# Patient Record
Sex: Female | Born: 1935 | Race: Black or African American | Hispanic: No | Smoking: Former smoker
Health system: Southern US, Community
[De-identification: ages and names within clinical notes are randomized; demographics above are authoritative.]

## PROBLEM LIST (undated history)

## (undated) DIAGNOSIS — N289 Disorder of kidney and ureter, unspecified: Secondary | ICD-10-CM

## (undated) DIAGNOSIS — R011 Cardiac murmur, unspecified: Secondary | ICD-10-CM

## (undated) DIAGNOSIS — G20A1 Parkinson's disease without dyskinesia, without mention of fluctuations: Secondary | ICD-10-CM

## (undated) DIAGNOSIS — K219 Gastro-esophageal reflux disease without esophagitis: Secondary | ICD-10-CM

## (undated) DIAGNOSIS — I739 Peripheral vascular disease, unspecified: Secondary | ICD-10-CM

## (undated) DIAGNOSIS — Z8719 Personal history of other diseases of the digestive system: Secondary | ICD-10-CM

## (undated) DIAGNOSIS — K649 Unspecified hemorrhoids: Secondary | ICD-10-CM

## (undated) DIAGNOSIS — E039 Hypothyroidism, unspecified: Secondary | ICD-10-CM

## (undated) DIAGNOSIS — I10 Essential (primary) hypertension: Secondary | ICD-10-CM

## (undated) DIAGNOSIS — M1990 Unspecified osteoarthritis, unspecified site: Secondary | ICD-10-CM

## (undated) DIAGNOSIS — I499 Cardiac arrhythmia, unspecified: Secondary | ICD-10-CM

## (undated) DIAGNOSIS — G20 Parkinson's disease: Secondary | ICD-10-CM

## (undated) DIAGNOSIS — G629 Polyneuropathy, unspecified: Secondary | ICD-10-CM

## (undated) DIAGNOSIS — D649 Anemia, unspecified: Secondary | ICD-10-CM

## (undated) DIAGNOSIS — I809 Phlebitis and thrombophlebitis of unspecified site: Secondary | ICD-10-CM

## (undated) DIAGNOSIS — I509 Heart failure, unspecified: Secondary | ICD-10-CM

## (undated) DIAGNOSIS — L97509 Non-pressure chronic ulcer of other part of unspecified foot with unspecified severity: Secondary | ICD-10-CM

## (undated) HISTORY — DX: Unspecified osteoarthritis, unspecified site: M19.90

## (undated) HISTORY — DX: Anemia, unspecified: D64.9

## (undated) HISTORY — DX: Essential (primary) hypertension: I10

## (undated) HISTORY — PX: CHOLECYSTECTOMY: SHX55

## (undated) HISTORY — PX: ABDOMINAL HYSTERECTOMY: SHX81

## (undated) HISTORY — PX: BACK SURGERY: SHX140

## (undated) HISTORY — DX: Gastro-esophageal reflux disease without esophagitis: K21.9

## (undated) HISTORY — DX: Unspecified hemorrhoids: K64.9

---

## 1979-10-03 LAB — HM PAP SMEAR

## 2003-04-23 ENCOUNTER — Other Ambulatory Visit: Payer: Self-pay

## 2003-10-06 ENCOUNTER — Other Ambulatory Visit: Payer: Self-pay

## 2004-02-10 ENCOUNTER — Other Ambulatory Visit: Payer: Self-pay

## 2004-02-10 ENCOUNTER — Emergency Department: Payer: Self-pay | Admitting: Emergency Medicine

## 2004-02-25 ENCOUNTER — Ambulatory Visit: Payer: Self-pay | Admitting: Internal Medicine

## 2004-03-19 ENCOUNTER — Other Ambulatory Visit: Payer: Self-pay

## 2004-03-19 ENCOUNTER — Emergency Department: Payer: Self-pay | Admitting: Emergency Medicine

## 2004-03-30 ENCOUNTER — Emergency Department: Payer: Self-pay | Admitting: Emergency Medicine

## 2004-08-11 ENCOUNTER — Ambulatory Visit: Payer: Self-pay

## 2005-02-10 ENCOUNTER — Emergency Department: Payer: Self-pay | Admitting: Emergency Medicine

## 2005-04-17 ENCOUNTER — Emergency Department: Payer: Self-pay | Admitting: Emergency Medicine

## 2005-09-09 ENCOUNTER — Ambulatory Visit: Payer: Self-pay | Admitting: Family Medicine

## 2005-10-19 ENCOUNTER — Ambulatory Visit: Payer: Self-pay | Admitting: Unknown Physician Specialty

## 2006-01-18 ENCOUNTER — Other Ambulatory Visit: Payer: Self-pay

## 2006-01-18 ENCOUNTER — Emergency Department: Payer: Self-pay | Admitting: Unknown Physician Specialty

## 2006-03-16 ENCOUNTER — Other Ambulatory Visit: Payer: Self-pay

## 2006-03-16 ENCOUNTER — Emergency Department: Payer: Self-pay | Admitting: Emergency Medicine

## 2006-04-15 ENCOUNTER — Ambulatory Visit: Payer: Self-pay | Admitting: Family Medicine

## 2006-06-18 ENCOUNTER — Emergency Department: Payer: Self-pay | Admitting: Emergency Medicine

## 2006-06-18 ENCOUNTER — Other Ambulatory Visit: Payer: Self-pay

## 2006-07-08 ENCOUNTER — Emergency Department: Payer: Self-pay | Admitting: Emergency Medicine

## 2006-07-08 ENCOUNTER — Other Ambulatory Visit: Payer: Self-pay

## 2006-10-03 LAB — HM COLONOSCOPY

## 2006-12-06 ENCOUNTER — Ambulatory Visit: Payer: Self-pay | Admitting: Gastroenterology

## 2007-01-15 ENCOUNTER — Other Ambulatory Visit: Payer: Self-pay

## 2007-01-15 ENCOUNTER — Emergency Department: Payer: Self-pay

## 2007-01-18 ENCOUNTER — Ambulatory Visit: Payer: Self-pay | Admitting: Internal Medicine

## 2007-02-14 ENCOUNTER — Ambulatory Visit: Payer: Self-pay | Admitting: Gastroenterology

## 2007-03-07 ENCOUNTER — Ambulatory Visit: Payer: Self-pay | Admitting: Internal Medicine

## 2007-03-14 ENCOUNTER — Inpatient Hospital Stay: Payer: Self-pay | Admitting: Internal Medicine

## 2007-03-14 ENCOUNTER — Other Ambulatory Visit: Payer: Self-pay

## 2007-04-25 ENCOUNTER — Ambulatory Visit: Payer: Self-pay | Admitting: Internal Medicine

## 2007-07-27 ENCOUNTER — Ambulatory Visit: Payer: Self-pay | Admitting: Internal Medicine

## 2007-08-31 ENCOUNTER — Ambulatory Visit: Payer: Self-pay | Admitting: Unknown Physician Specialty

## 2007-09-13 ENCOUNTER — Ambulatory Visit: Payer: Self-pay | Admitting: Unknown Physician Specialty

## 2007-10-09 ENCOUNTER — Other Ambulatory Visit: Payer: Self-pay

## 2007-10-09 ENCOUNTER — Emergency Department: Payer: Self-pay | Admitting: Emergency Medicine

## 2007-10-23 ENCOUNTER — Encounter: Payer: Self-pay | Admitting: Internal Medicine

## 2007-10-25 ENCOUNTER — Encounter: Payer: Self-pay | Admitting: Internal Medicine

## 2007-11-14 ENCOUNTER — Ambulatory Visit: Payer: Self-pay | Admitting: Internal Medicine

## 2007-11-23 ENCOUNTER — Ambulatory Visit: Payer: Self-pay | Admitting: Internal Medicine

## 2008-03-07 ENCOUNTER — Emergency Department: Payer: Self-pay | Admitting: Emergency Medicine

## 2008-07-24 ENCOUNTER — Observation Stay: Payer: Self-pay | Admitting: Internal Medicine

## 2008-08-13 ENCOUNTER — Emergency Department: Payer: Self-pay | Admitting: Emergency Medicine

## 2008-12-28 ENCOUNTER — Emergency Department: Payer: Self-pay | Admitting: Emergency Medicine

## 2009-01-01 ENCOUNTER — Emergency Department: Payer: Self-pay | Admitting: Emergency Medicine

## 2009-01-09 ENCOUNTER — Ambulatory Visit: Payer: Self-pay | Admitting: Vascular Surgery

## 2009-01-22 ENCOUNTER — Ambulatory Visit: Payer: Self-pay | Admitting: Unknown Physician Specialty

## 2009-03-25 ENCOUNTER — Emergency Department: Payer: Self-pay | Admitting: Emergency Medicine

## 2009-05-21 ENCOUNTER — Emergency Department: Payer: Self-pay | Admitting: Emergency Medicine

## 2009-05-27 HISTORY — PX: ARTHROPLASTY W/ ARTHROSCOPY MEDIAL / LATERAL COMPARTMENT KNEE: SUR78

## 2009-06-13 ENCOUNTER — Ambulatory Visit: Payer: Self-pay | Admitting: Unknown Physician Specialty

## 2009-06-19 ENCOUNTER — Ambulatory Visit: Payer: Self-pay | Admitting: Unknown Physician Specialty

## 2009-09-01 LAB — HM MAMMOGRAPHY: HM Mammogram: NORMAL

## 2009-09-03 ENCOUNTER — Ambulatory Visit: Payer: Self-pay | Admitting: Internal Medicine

## 2009-09-28 LAB — HM COLONOSCOPY: HM Colonoscopy: NORMAL

## 2009-11-16 ENCOUNTER — Emergency Department: Payer: Self-pay | Admitting: Unknown Physician Specialty

## 2009-11-26 ENCOUNTER — Emergency Department: Payer: Self-pay | Admitting: Emergency Medicine

## 2009-12-07 ENCOUNTER — Emergency Department: Payer: Self-pay | Admitting: Emergency Medicine

## 2010-02-11 ENCOUNTER — Ambulatory Visit: Payer: Self-pay | Admitting: Nephrology

## 2010-02-24 ENCOUNTER — Ambulatory Visit: Payer: Self-pay | Admitting: Internal Medicine

## 2010-03-17 ENCOUNTER — Ambulatory Visit: Payer: Self-pay | Admitting: Internal Medicine

## 2010-03-26 ENCOUNTER — Ambulatory Visit: Payer: Self-pay | Admitting: Internal Medicine

## 2010-04-26 ENCOUNTER — Ambulatory Visit: Payer: Self-pay | Admitting: Internal Medicine

## 2010-10-18 ENCOUNTER — Emergency Department: Payer: Self-pay | Admitting: Emergency Medicine

## 2010-11-18 ENCOUNTER — Ambulatory Visit: Payer: Self-pay | Admitting: Gastroenterology

## 2010-11-19 LAB — PATHOLOGY REPORT

## 2010-12-30 ENCOUNTER — Ambulatory Visit: Payer: Self-pay | Admitting: Internal Medicine

## 2011-01-12 ENCOUNTER — Other Ambulatory Visit: Payer: Self-pay | Admitting: Internal Medicine

## 2011-01-12 ENCOUNTER — Ambulatory Visit: Payer: Self-pay | Admitting: Internal Medicine

## 2011-01-13 MED ORDER — ATENOLOL 100 MG PO TABS: 100.0000 mg | ORAL_TABLET | Freq: Two times a day (BID) | ORAL | Status: DC

## 2011-01-26 ENCOUNTER — Telehealth: Payer: Self-pay | Admitting: Internal Medicine

## 2011-01-26 ENCOUNTER — Other Ambulatory Visit: Payer: Self-pay | Admitting: Internal Medicine

## 2011-01-26 ENCOUNTER — Other Ambulatory Visit: Payer: Self-pay | Admitting: *Deleted

## 2011-01-26 MED ORDER — METFORMIN HCL 500 MG PO TABS: 500.0000 mg | ORAL_TABLET | Freq: Two times a day (BID) | ORAL | Status: DC

## 2011-01-26 NOTE — Telephone Encounter (Signed)
I called patient and she stated something bit her last week, but she thinks she had a low grade fever today.  I advised her we do not have an appt available until Friday, she refused to go to Urgent Care and stated she will just take an appt on Friday.  I advised her if she started to feel worse or developed a fever then she needed to go straight to the ER or Urgent Care, patient understands.

## 2011-01-26 NOTE — Telephone Encounter (Signed)
CELL # [phone] Pt called wanting  To be seen today if possible   Something bit her on right arm when she was picking greens.  She is broke out on her stomach and places on back. Low grade fever

## 2011-01-27 MED ORDER — ROSUVASTATIN CALCIUM 20 MG PO TABS: 20.0000 mg | ORAL_TABLET | Freq: Every day | ORAL | Status: DC

## 2011-01-29 ENCOUNTER — Telehealth: Payer: Self-pay | Admitting: Internal Medicine

## 2011-01-29 ENCOUNTER — Ambulatory Visit: Payer: Self-pay | Admitting: Internal Medicine

## 2011-01-29 MED ORDER — METFORMIN HCL 500 MG PO TABS: 500.0000 mg | ORAL_TABLET | Freq: Two times a day (BID) | ORAL | Status: DC

## 2011-01-29 NOTE — Telephone Encounter (Signed)
Metformin has been refilled.

## 2011-01-29 NOTE — Telephone Encounter (Signed)
Rx has been faxed in.  

## 2011-01-29 NOTE — Telephone Encounter (Signed)
h [phone] Pt wanted to make sure the pharmacy called in her rx for metformia  [facility] [place]. [facility] to [facility]  Please advise pt when rx is done

## 2011-02-03 ENCOUNTER — Encounter: Payer: Self-pay | Admitting: Internal Medicine

## 2011-02-04 ENCOUNTER — Ambulatory Visit: Payer: Self-pay | Admitting: Internal Medicine

## 2011-03-02 ENCOUNTER — Other Ambulatory Visit: Payer: Self-pay | Admitting: *Deleted

## 2011-03-02 DIAGNOSIS — F419 Anxiety disorder, unspecified: Secondary | ICD-10-CM

## 2011-03-03 MED ORDER — ALPRAZOLAM 0.5 MG PO TABS: 0.5000 mg | ORAL_TABLET | Freq: Two times a day (BID) | ORAL | Status: DC | PRN

## 2011-03-03 NOTE — Telephone Encounter (Signed)
Ok to refill #30 with 4 refills

## 2011-03-08 ENCOUNTER — Encounter: Payer: Self-pay | Admitting: Internal Medicine

## 2011-03-08 ENCOUNTER — Other Ambulatory Visit: Payer: Self-pay | Admitting: Internal Medicine

## 2011-03-08 DIAGNOSIS — F419 Anxiety disorder, unspecified: Secondary | ICD-10-CM

## 2011-03-09 ENCOUNTER — Telehealth: Payer: Self-pay | Admitting: *Deleted

## 2011-03-09 MED ORDER — ROSUVASTATIN CALCIUM 20 MG PO TABS: 20.0000 mg | ORAL_TABLET | Freq: Every day | ORAL | Status: DC

## 2011-03-09 MED ORDER — ESOMEPRAZOLE MAGNESIUM 40 MG PO CPDR: 40.0000 mg | DELAYED_RELEASE_CAPSULE | Freq: Every day | ORAL | Status: DC

## 2011-03-09 MED ORDER — METFORMIN HCL 500 MG PO TABS: 500.0000 mg | ORAL_TABLET | Freq: Two times a day (BID) | ORAL | Status: DC

## 2011-03-09 MED ORDER — ALPRAZOLAM 0.5 MG PO TABS: 0.5000 mg | ORAL_TABLET | Freq: Two times a day (BID) | ORAL | Status: DC | PRN

## 2011-03-09 MED ORDER — FEBUXOSTAT 40 MG PO TABS: 80.0000 mg | ORAL_TABLET | Freq: Every day | ORAL | Status: DC

## 2011-03-09 NOTE — Telephone Encounter (Signed)
Pt called req RF of xanax. Our records show RX was printed by MD, RX was not up front and was not called or faxed into pharm. I called in RX #[id number].

## 2011-03-10 ENCOUNTER — Other Ambulatory Visit: Payer: Self-pay | Admitting: Internal Medicine

## 2011-03-10 DIAGNOSIS — F419 Anxiety disorder, unspecified: Secondary | ICD-10-CM

## 2011-03-10 MED ORDER — ALPRAZOLAM 0.5 MG PO TABS: 0.5000 mg | ORAL_TABLET | Freq: Two times a day (BID) | ORAL | Status: DC | PRN

## 2011-03-10 MED ORDER — ROSUVASTATIN CALCIUM 20 MG PO TABS: 20.0000 mg | ORAL_TABLET | Freq: Every day | ORAL | Status: DC

## 2011-03-10 NOTE — Telephone Encounter (Signed)
Ok to refill both medications.  #3 refills on each

## 2011-04-01 ENCOUNTER — Other Ambulatory Visit: Payer: Self-pay | Admitting: Internal Medicine

## 2011-04-01 MED ORDER — FUROSEMIDE 40 MG PO TABS: 40.0000 mg | ORAL_TABLET | Freq: Two times a day (BID) | ORAL | Status: DC

## 2011-04-02 ENCOUNTER — Other Ambulatory Visit: Payer: Self-pay | Admitting: Internal Medicine

## 2011-04-12 ENCOUNTER — Encounter: Payer: Self-pay | Admitting: Internal Medicine

## 2011-04-12 ENCOUNTER — Ambulatory Visit (INDEPENDENT_AMBULATORY_CARE_PROVIDER_SITE_OTHER): Payer: Medicare Other | Admitting: Internal Medicine

## 2011-04-12 ENCOUNTER — Telehealth: Payer: Self-pay | Admitting: *Deleted

## 2011-04-12 DIAGNOSIS — Z1211 Encounter for screening for malignant neoplasm of colon: Secondary | ICD-10-CM | POA: Insufficient documentation

## 2011-04-12 DIAGNOSIS — Z79899 Other long term (current) drug therapy: Secondary | ICD-10-CM

## 2011-04-12 DIAGNOSIS — Z23 Encounter for immunization: Secondary | ICD-10-CM

## 2011-04-12 DIAGNOSIS — E119 Type 2 diabetes mellitus without complications: Secondary | ICD-10-CM | POA: Insufficient documentation

## 2011-04-12 DIAGNOSIS — E785 Hyperlipidemia, unspecified: Secondary | ICD-10-CM

## 2011-04-12 DIAGNOSIS — R5381 Other malaise: Secondary | ICD-10-CM

## 2011-04-12 DIAGNOSIS — Z13820 Encounter for screening for osteoporosis: Secondary | ICD-10-CM

## 2011-04-12 DIAGNOSIS — R5383 Other fatigue: Secondary | ICD-10-CM

## 2011-04-12 DIAGNOSIS — Z1239 Encounter for other screening for malignant neoplasm of breast: Secondary | ICD-10-CM

## 2011-04-12 DIAGNOSIS — I10 Essential (primary) hypertension: Secondary | ICD-10-CM

## 2011-04-12 DIAGNOSIS — Z0000 Encounter for general adult medical examination without abnormal findings: Secondary | ICD-10-CM

## 2011-04-12 LAB — CBC WITH DIFFERENTIAL/PLATELET
Basophils Relative: 0.4 % (ref 0.0–3.0)
Eosinophils Absolute: 0 10*3/uL (ref 0.0–0.7)
MCHC: 35.4 g/dL (ref 30.0–36.0)
MCV: 95.2 fl (ref 78.0–100.0)
Monocytes Absolute: 0.2 10*3/uL (ref 0.1–1.0)
Neutrophils Relative %: 46.9 % (ref 43.0–77.0)
Platelets: 239 10*3/uL (ref 150.0–400.0)
RBC: 3.26 Mil/uL — ABNORMAL LOW (ref 3.87–5.11)

## 2011-04-12 LAB — COMPREHENSIVE METABOLIC PANEL
AST: 21 U/L (ref 0–37)
Albumin: 4.2 g/dL (ref 3.5–5.2)
Alkaline Phosphatase: 85 U/L (ref 39–117)
Potassium: 3.8 mEq/L (ref 3.5–5.1)
Sodium: 140 mEq/L (ref 135–145)
Total Protein: 7.6 g/dL (ref 6.0–8.3)

## 2011-04-12 LAB — LIPID PANEL
LDL Cholesterol: 76 mg/dL (ref 0–99)
VLDL: 20 mg/dL (ref 0.0–40.0)

## 2011-04-12 NOTE — Telephone Encounter (Signed)
I think she is referring to magnesium citrate which comes in a green bottle and works within an hour,  It does not rerquire a prescripiton  But should not be used more than once in a blue moon because it is hard on the bowels  Makes sure that she is drinking at least 3 10 ounce servings of water daily.

## 2011-04-12 NOTE — Progress Notes (Signed)
Subjective:     [name] is a 75 y.o. female and is here for a comprehensive physical exam. The patient reports problems - chronic sinus drainage aggravated by eating .  History   Social History  . Marital Status: Single    Spouse Name: N/A    Number of Children: N/A  . Years of Education: N/A   Occupational History  . Not on file.   Social History Main Topics  . Smoking status: Former Games developer  . Smokeless tobacco: Current User    Types: Snuff   Comment: quit 6-7 years ago  . Alcohol Use: No  . Drug Use: No  . Sexually Active: Not on file   Other Topics Concern  . Not on file   Social History Narrative  . No narrative on file   Health Maintenance  Topic Date Due  . Tetanus/tdap  04/08/1957  . Colonoscopy  04/08/1988  . Zostavax  04/08/1998  . Pneumococcal Polysaccharide Vaccine Age 53 And Over  04/09/2003  . Influenza Vaccine  01/25/2011    The following portions of the patient's history were reviewed and updated as appropriate: allergies, current medications, past family history, past medical history, past social history, past surgical history and problem list.  Review of Systems A comprehensive review of systems was negative.   Objective:    BP 118/68  Pulse 60  Temp(Src) 98.3 F (36.8 C) (Oral)  Ht 5' 3.75" (1.619 m)  Wt 183 lb 4 oz (83.122 kg)  BMI 31.70 kg/m2 General appearance: alert, cooperative and appears stated age Eyes: conjunctivae/corneas clear. PERRL, EOM's intact. Fundi benign. Ears: normal TM's and external ear canals both ears Nose: Nares normal. Septum midline. Mucosa normal. No drainage or sinus tenderness. Throat: lips, mucosa, and tongue normal; teeth and gums normal Lungs: clear to auscultation bilaterally Breasts: normal appearance, no masses or tenderness Heart: regular rate and rhythm, S1, S2 normal, no murmur, click, rub or gallop Abdomen: soft, non-tender; bowel sounds normal; no masses,  no organomegaly Extremities:  extremities normal, atraumatic, no cyanosis or edema Pulses: 2+ and symmetric Neurologic: Alert and oriented X 3, normal strength and tone. Normal symmetric reflexes. Normal coordination and gait    Assessment:  Rhinitis  Healthy female exam.    Hypertension Labile, aggraated historically by anxiety.  No changes today  Screening for osteoporosis She is due for DEXA scan    Updated Medication List Outpatient Encounter Prescriptions as of 04/12/2011  Medication Sig Dispense Refill  . ALPRAZolam (XANAX) 0.5 MG tablet Take 1 tablet (0.5 mg total) by mouth 2 (two) times daily as needed for anxiety.  60 tablet  4  . aspirin 81 MG tablet Take 81 mg by mouth daily.        Marland Kitchen atenolol (TENORMIN) 100 MG tablet Take 1 tablet (100 mg total) by mouth 2 (two) times daily.  60 tablet  3  . cloNIDine (CATAPRES) 0.2 MG tablet Take 0.2 mg by mouth 2 (two) times daily.        Marland Kitchen conjugated estrogens (PREMARIN) vaginal cream Place vaginally 2 (two) times a week.        . esomeprazole (NEXIUM) 40 MG capsule Take 1 capsule (40 mg total) by mouth daily before breakfast.  30 capsule  5  . furosemide (LASIX) 40 MG tablet Take 1 tablet (40 mg total) by mouth 2 (two) times daily.  60 tablet  3  . lisinopril (PRINIVIL,ZESTRIL) 40 MG tablet Take 40 mg by mouth 2 (two) times daily.        Marland Kitchen  metFORMIN (GLUCOPHAGE) 500 MG tablet Take 1 tablet (500 mg total) by mouth 2 (two) times daily with a meal.  60 tablet  6  . polyethylene glycol (MIRALAX / GLYCOLAX) packet Take 17 g by mouth as needed.        . rosuvastatin (CRESTOR) 20 MG tablet Take 1 tablet (20 mg total) by mouth daily.  30 tablet  3  . valsartan (DIOVAN) 320 MG tablet Take 320 mg by mouth daily.        . febuxostat (ULORIC) 40 MG tablet Take 2 tablets (80 mg total) by mouth daily.  30 tablet  5  . meloxicam (MOBIC) 7.5 MG tablet Take 7.5 mg by mouth daily.        . pantoprazole (PROTONIX) 40 MG tablet Take 40 mg by mouth daily.        . potassium chloride  SA (K-DUR,KLOR-CON) 20 MEQ tablet Take 20 mEq by mouth daily.        . traMADol (ULTRAM) 50 MG tablet Take 50 mg by mouth 4 (four) times daily.          Plan:     See After Visit Summary for Counseling Recommendations

## 2011-04-12 NOTE — Assessment & Plan Note (Signed)
She is due for DEXA scan

## 2011-04-12 NOTE — Telephone Encounter (Signed)
Patient has been scheduled and notified of appoitment for mammogram and bone DX test on January 29,2013 @ [facility] mam @ 12:45 Bone DX @ 1:00 .

## 2011-04-12 NOTE — Assessment & Plan Note (Signed)
Labile, aggraated historically by anxiety.  No changes today

## 2011-04-12 NOTE — Assessment & Plan Note (Signed)
Managed with metformin, hgba1c due.

## 2011-04-12 NOTE — Telephone Encounter (Signed)
Pt states that miralax takes 3 day or more to work. She tried a green liquid once that worked very quickly. She feels that it is prescription only but unsure of name. Do you have any ideas for RX liquid laxative?

## 2011-04-13 NOTE — Progress Notes (Signed)
Addended by: Duncan Dull on: 04/13/2011 08:51 AM   Modules accepted: Kipp Brood

## 2011-04-13 NOTE — Progress Notes (Signed)
Subjective:    [name] is a 75 y.o. female who presents for Medicare Annual/Subsequent preventive examination.  Preventive Screening-Counseling & Management  Tobacco History  Smoking status  . Former Smoker  Smokeless tobacco  . Biochemist, clinical  . Types: Snuff  Comment: quit 6-7 years ago     Problems Prior to Visit 1.   Current Problems (verified) Patient Active Problem List  Diagnoses  . Screening for colon cancer  . Screening for breast cancer  . Hypertension  . Hyperlipidemia LDL goal < 100  . Screening for osteoporosis  . Diabetes mellitus    Medications Prior to Visit Current Outpatient Prescriptions on File Prior to Visit  Medication Sig Dispense Refill  . ALPRAZolam (XANAX) 0.5 MG tablet Take 1 tablet (0.5 mg total) by mouth 2 (two) times daily as needed for anxiety.  60 tablet  4  . aspirin 81 MG tablet Take 81 mg by mouth daily.        Marland Kitchen atenolol (TENORMIN) 100 MG tablet Take 1 tablet (100 mg total) by mouth 2 (two) times daily.  60 tablet  3  . cloNIDine (CATAPRES) 0.2 MG tablet Take 0.2 mg by mouth 2 (two) times daily.        Marland Kitchen conjugated estrogens (PREMARIN) vaginal cream Place vaginally 2 (two) times a week.        . esomeprazole (NEXIUM) 40 MG capsule Take 1 capsule (40 mg total) by mouth daily before breakfast.  30 capsule  5  . furosemide (LASIX) 40 MG tablet Take 1 tablet (40 mg total) by mouth 2 (two) times daily.  60 tablet  3  . lisinopril (PRINIVIL,ZESTRIL) 40 MG tablet Take 40 mg by mouth 2 (two) times daily.        . metFORMIN (GLUCOPHAGE) 500 MG tablet Take 1 tablet (500 mg total) by mouth 2 (two) times daily with a meal.  60 tablet  6  . polyethylene glycol (MIRALAX / GLYCOLAX) packet Take 17 g by mouth as needed.        . rosuvastatin (CRESTOR) 20 MG tablet Take 1 tablet (20 mg total) by mouth daily.  30 tablet  3  . valsartan (DIOVAN) 320 MG tablet Take 320 mg by mouth daily.        . febuxostat (ULORIC) 40 MG tablet Take 2 tablets (80 mg  total) by mouth daily.  30 tablet  5  . meloxicam (MOBIC) 7.5 MG tablet Take 7.5 mg by mouth daily.        . pantoprazole (PROTONIX) 40 MG tablet Take 40 mg by mouth daily.        . potassium chloride SA (K-DUR,KLOR-CON) 20 MEQ tablet Take 20 mEq by mouth daily.        . traMADol (ULTRAM) 50 MG tablet Take 50 mg by mouth 4 (four) times daily.          Current Medications (verified) Current Outpatient Prescriptions  Medication Sig Dispense Refill  . ALPRAZolam (XANAX) 0.5 MG tablet Take 1 tablet (0.5 mg total) by mouth 2 (two) times daily as needed for anxiety.  60 tablet  4  . aspirin 81 MG tablet Take 81 mg by mouth daily.        Marland Kitchen atenolol (TENORMIN) 100 MG tablet Take 1 tablet (100 mg total) by mouth 2 (two) times daily.  60 tablet  3  . cloNIDine (CATAPRES) 0.2 MG tablet Take 0.2 mg by mouth 2 (two) times daily.        Marland Kitchen  conjugated estrogens (PREMARIN) vaginal cream Place vaginally 2 (two) times a week.        . esomeprazole (NEXIUM) 40 MG capsule Take 1 capsule (40 mg total) by mouth daily before breakfast.  30 capsule  5  . furosemide (LASIX) 40 MG tablet Take 1 tablet (40 mg total) by mouth 2 (two) times daily.  60 tablet  3  . lisinopril (PRINIVIL,ZESTRIL) 40 MG tablet Take 40 mg by mouth 2 (two) times daily.        . metFORMIN (GLUCOPHAGE) 500 MG tablet Take 1 tablet (500 mg total) by mouth 2 (two) times daily with a meal.  60 tablet  6  . polyethylene glycol (MIRALAX / GLYCOLAX) packet Take 17 g by mouth as needed.        . rosuvastatin (CRESTOR) 20 MG tablet Take 1 tablet (20 mg total) by mouth daily.  30 tablet  3  . valsartan (DIOVAN) 320 MG tablet Take 320 mg by mouth daily.        . febuxostat (ULORIC) 40 MG tablet Take 2 tablets (80 mg total) by mouth daily.  30 tablet  5  . meloxicam (MOBIC) 7.5 MG tablet Take 7.5 mg by mouth daily.        . pantoprazole (PROTONIX) 40 MG tablet Take 40 mg by mouth daily.        . potassium chloride SA (K-DUR,KLOR-CON) 20 MEQ tablet Take 20  mEq by mouth daily.        . traMADol (ULTRAM) 50 MG tablet Take 50 mg by mouth 4 (four) times daily.           Allergies (verified) Morphine and related   PAST HISTORY  Family History Family History  Problem Relation Age of Onset  . Heart disease Mother   . Hypertension Mother   . Cancer Mother     Social History History  Substance Use Topics  . Smoking status: Former Games developer  . Smokeless tobacco: Current User    Types: Snuff   Comment: quit 6-7 years ago  . Alcohol Use: No     Are there smokers in your home (other than you)? No  Risk Factors Current exercise habits: Home exercise routine includes walking 0.5 hrs per day.  Dietary issues discussed: yes   Cardiac risk factors: advanced age (older than 75 for men, 65 for women), diabetes mellitus, hypertension and obesity (BMI >= 30 kg/m2).  Depression Screen (Note: if answer to either of the following is "Yes", a more complete depression screening is indicated)   Over the past two weeks, have you felt down, depressed or hopeless? No  Over the past two weeks, have you felt little interest or pleasure in doing things? No  Have you lost interest or pleasure in daily life? No  Do you often feel hopeless? No  Do you cry easily over simple problems? No  Activities of Daily Living In your present state of health, do you have any difficulty performing the following activities?:  Driving? No Managing money?  No Feeding yourself? No Getting from bed to chair? No Climbing a flight of stairs? No Preparing food and eating?: No Bathing or showering? No Getting dressed: No Getting to the toilet? No Using the toilet:No Moving around from place to place: No In the past year have you fallen or had a near fall?:No   Are you sexually active?  No  Do you have more than one partner?  No  Hearing Difficulties: No Do you often  ask people to speak up or repeat themselves? No Do you experience ringing or noises in your ears?  No Do you have difficulty understanding soft or whispered voices? No   Do you feel that you have a problem with memory? No  Do you often misplace items? No  Do you feel safe at home?  Yes  Cognitive Testing  Alert? Yes  Normal Appearance?Yes  Oriented to person? Yes  Place? Yes   Time? Yes  Recall of three objects?  Yes  Can perform simple calculations? Yes  Displays appropriate judgment?Yes  Can read the correct time from a watch face?Yes   Advanced Directives have been discussed with the patient? Yes  List the Names of Other Physician/Practitioners you currently use: 1.    Indicate any recent Medical Services you may have received from other than [facility] providers in the past year (date may be approximate).  Immunization History  Administered Date(s) Administered  . Influenza Split 04/12/2011    Screening Tests Health Maintenance  Topic Date Due  . Tetanus/tdap  04/08/1957  . Colonoscopy  04/08/1988  . Zostavax  04/08/1998  . Pneumococcal Polysaccharide Vaccine Age 29 And Over  04/09/2003  . Influenza Vaccine  01/25/2012    All answers were reviewed with the patient and necessary referrals were made:  Duncan Dull, MD   04/13/2011   History reviewed: allergies, current medications, past family history, past medical history, past social history, past surgical history and problem list  Review of Systems A comprehensive review of systems was negative.    Objective:     Vision by Snellen chart: right ZOX:WRUEAVW declines measurement, left UJW:[id number] declines measurement  Body mass index is 31.70 kg/(m^2). BP 118/68  Pulse 60  Temp(Src) 98.3 F (36.8 C) (Oral)  Ht 5' 3.75" (1.619 m)  Wt 183 lb 4 oz (83.122 kg)  BMI 31.70 kg/m2       Assessment:      Plan:     During the course of the visit the patient was educated and counseled about appropriate screening and preventive services including:    Screening mammography  Diet review for nutrition  referral? Yes ____  Not Indicated ____   Patient Instructions (the written plan) was given to the patient.  Medicare Attestation I have personally reviewed: The patient's medical and social history Their use of alcohol, tobacco or illicit drugs Their current medications and supplements The patient's functional ability including ADLs,fall risks, home safety risks, cognitive, and hearing and visual impairment Diet and physical activities Evidence for depression or mood disorders  The patient's weight, height, BMI, and visual acuity have been recorded in the chart.  I have made referrals, counseling, and provided education to the patient based on review of the above and I have provided the patient with a written personalized care plan for preventive services.     Duncan Dull, MD   04/13/2011

## 2011-04-13 NOTE — Telephone Encounter (Signed)
Patient informed, she says that is not what it is and will get the name and bring it in at next OV.

## 2011-04-15 ENCOUNTER — Other Ambulatory Visit: Payer: Self-pay | Admitting: Internal Medicine

## 2011-04-15 ENCOUNTER — Ambulatory Visit: Payer: Self-pay | Admitting: Internal Medicine

## 2011-04-15 DIAGNOSIS — Z79899 Other long term (current) drug therapy: Secondary | ICD-10-CM

## 2011-04-16 ENCOUNTER — Ambulatory Visit (INDEPENDENT_AMBULATORY_CARE_PROVIDER_SITE_OTHER): Payer: Medicare Other | Admitting: Internal Medicine

## 2011-04-16 ENCOUNTER — Encounter: Payer: Self-pay | Admitting: Internal Medicine

## 2011-04-16 ENCOUNTER — Other Ambulatory Visit: Payer: Self-pay | Admitting: *Deleted

## 2011-04-16 VITALS — BP 142/52 | HR 80 | Temp 97.8°F | Wt 189.4 lb

## 2011-04-16 DIAGNOSIS — E785 Hyperlipidemia, unspecified: Secondary | ICD-10-CM

## 2011-04-16 DIAGNOSIS — R079 Chest pain, unspecified: Secondary | ICD-10-CM

## 2011-04-16 DIAGNOSIS — E119 Type 2 diabetes mellitus without complications: Secondary | ICD-10-CM

## 2011-04-16 DIAGNOSIS — Z79899 Other long term (current) drug therapy: Secondary | ICD-10-CM

## 2011-04-16 LAB — BASIC METABOLIC PANEL
BUN: 26 mg/dL — ABNORMAL HIGH (ref 6–23)
Chloride: 100 mEq/L (ref 96–112)
Potassium: 4.2 mEq/L (ref 3.5–5.3)
Sodium: 137 mEq/L (ref 135–145)

## 2011-04-16 MED ORDER — CLONIDINE HCL 0.2 MG PO TABS: 0.2000 mg | ORAL_TABLET | Freq: Two times a day (BID) | ORAL | Status: DC

## 2011-04-16 NOTE — Progress Notes (Signed)
Subjective:    Patient ID: [name], female    DOB: 28-Mar-1936, 75 y.o.   MRN: [id number]  HPI  Ms. Rodenberg was referred back to PCP after being discharged from [facility] by [facility] after a comprehensive evaluation of chest pain was done with a cardiac catheterization,  which showed nonocclusive disease.  Her chest pain symptoms were attributed to noncardac causes. She has had prior endoscopy and gastric emptying study to work up prior episodes of chest pain .  Esophagitis reportedly was not found but she has a history of GERD and takes Nexium daily.  Past Medical History  Diagnosis Date  . Diabetes mellitus   . GERD (gastroesophageal reflux disease)   . Hypertension     Current Outpatient Prescriptions on File Prior to Visit  Medication Sig Dispense Refill  . ALPRAZolam (XANAX) 0.5 MG tablet Take 1 tablet (0.5 mg total) by mouth 2 (two) times daily as needed for anxiety.  60 tablet  4  . aspirin 81 MG tablet Take 81 mg by mouth daily.        Marland Kitchen atenolol (TENORMIN) 100 MG tablet Take 1 tablet (100 mg total) by mouth 2 (two) times daily.  60 tablet  3  . conjugated estrogens (PREMARIN) vaginal cream Place vaginally 2 (two) times a week.        . esomeprazole (NEXIUM) 40 MG capsule Take 1 capsule (40 mg total) by mouth daily before breakfast.  30 capsule  5  . febuxostat (ULORIC) 40 MG tablet Take 2 tablets (80 mg total) by mouth daily.  30 tablet  5  . furosemide (LASIX) 40 MG tablet Take 1 tablet (40 mg total) by mouth 2 (two) times daily.  60 tablet  3  . lisinopril (PRINIVIL,ZESTRIL) 40 MG tablet Take 40 mg by mouth 2 (two) times daily.        . meloxicam (MOBIC) 7.5 MG tablet Take 7.5 mg by mouth daily.        . metFORMIN (GLUCOPHAGE) 500 MG tablet Take 1 tablet (500 mg total) by mouth 2 (two) times daily with a meal.  60 tablet  6  . pantoprazole (PROTONIX) 40 MG tablet Take 40 mg by mouth daily.        . polyethylene glycol (MIRALAX / GLYCOLAX) packet Take 17 g by mouth as  needed.        . potassium chloride SA (K-DUR,KLOR-CON) 20 MEQ tablet Take 20 mEq by mouth daily.        . rosuvastatin (CRESTOR) 20 MG tablet Take 1 tablet (20 mg total) by mouth daily.  30 tablet  3  . traMADol (ULTRAM) 50 MG tablet Take 50 mg by mouth 4 (four) times daily.        . valsartan (DIOVAN) 320 MG tablet Take 320 mg by mouth daily.           Review of Systems  Constitutional: Negative for fever, chills and unexpected weight change.  HENT: Negative for hearing loss, ear pain, nosebleeds, congestion, sore throat, facial swelling, rhinorrhea, sneezing, mouth sores, trouble swallowing, neck pain, neck stiffness, voice change, postnasal drip, sinus pressure, tinnitus and ear discharge.   Eyes: Negative for pain, discharge, redness and visual disturbance.  Respiratory: Positive for chest tightness. Negative for cough, shortness of breath, wheezing and stridor.   Cardiovascular: Negative for chest pain, palpitations and leg swelling.  Musculoskeletal: Negative for myalgias and arthralgias.  Skin: Negative for color change and rash.  Neurological: Negative for dizziness, weakness, light-headedness  and headaches.  Hematological: Negative for adenopathy.  Psychiatric/Behavioral: The patient is nervous/anxious.        Objective:   Physical Exam  Constitutional: She is oriented to person, place, and time. She appears well-developed and well-nourished.  HENT:  Mouth/Throat: Oropharynx is clear and moist.  Eyes: EOM are normal. Pupils are equal, round, and reactive to light. No scleral icterus.  Neck: Normal range of motion. Neck supple. No JVD present. No thyromegaly present.  Cardiovascular: Normal rate, regular rhythm, normal heart sounds and intact distal pulses.   Pulmonary/Chest: Effort normal and breath sounds normal.  Abdominal: Soft. Bowel sounds are normal. She exhibits no mass. There is no tenderness.  Musculoskeletal: Normal range of motion. She exhibits no edema.    Lymphadenopathy:    She has no cervical adenopathy.  Neurological: She is alert and oriented to person, place, and time.  Skin: Skin is warm and dry.  Psychiatric: She has a normal mood and affect.       Assessment & Plan:

## 2011-04-16 NOTE — Telephone Encounter (Signed)
Patient requesting a refill of clonidine 0.2, one every 12 hours, #60.  Okay to refill?

## 2011-04-16 NOTE — Patient Instructions (Signed)
I recommend a medicine for gas that you can try the next time you have the pain that sent you to the ER ,  Mylanta GAS   Stay on your Nexium.   Take your miralax, or metamucil, or citrucel or benefiber DAILY to manage your bowels.  Do not resume your metformin until Sunday

## 2011-04-21 DIAGNOSIS — R079 Chest pain, unspecified: Secondary | ICD-10-CM | POA: Insufficient documentation

## 2011-04-21 NOTE — Assessment & Plan Note (Signed)
Well controlled on current regimen by last A1c of 6.5 Dec 17.  No changes today

## 2011-04-21 NOTE — Assessment & Plan Note (Addendum)
Despite reassurance that her she has no evidence of CAD on cath, she will probably continue to return to the Er for evaluation of future episodes of chest pain.  I suggested that we try treating her more aggressively for  reflux/esophagitis, since she is not describing dysphagia, with trial of mylanta or gaviscon for acute symptoms.  I f symptoms persist she would benefit from a repeat endoscopy or possibly an esophogeal manometry or pH study

## 2011-04-21 NOTE — Assessment & Plan Note (Signed)
Well controlled, LDL at goal on current therapy.  No changes today.  Repeat CMET and lipids in 3 months

## 2011-05-06 ENCOUNTER — Other Ambulatory Visit: Payer: Self-pay | Admitting: *Deleted

## 2011-05-06 MED ORDER — ESOMEPRAZOLE MAGNESIUM 40 MG PO CPDR: 40.0000 mg | DELAYED_RELEASE_CAPSULE | Freq: Every day | ORAL | Status: DC

## 2011-05-21 ENCOUNTER — Other Ambulatory Visit: Payer: Self-pay | Admitting: *Deleted

## 2011-05-21 MED ORDER — VALSARTAN 320 MG PO TABS: 320.0000 mg | ORAL_TABLET | Freq: Every day | ORAL | Status: DC

## 2011-05-21 MED ORDER — ATENOLOL 100 MG PO TABS: 100.0000 mg | ORAL_TABLET | Freq: Two times a day (BID) | ORAL | Status: DC

## 2011-06-17 ENCOUNTER — Other Ambulatory Visit: Payer: Self-pay | Admitting: *Deleted

## 2011-06-17 MED ORDER — FEBUXOSTAT 40 MG PO TABS: 40.0000 mg | ORAL_TABLET | Freq: Every day | ORAL | Status: DC

## 2011-07-02 ENCOUNTER — Other Ambulatory Visit: Payer: Self-pay | Admitting: Internal Medicine

## 2011-07-02 MED ORDER — LISINOPRIL 40 MG PO TABS: 40.0000 mg | ORAL_TABLET | Freq: Two times a day (BID) | ORAL | Status: DC

## 2011-07-19 ENCOUNTER — Telehealth: Payer: Self-pay | Admitting: Internal Medicine

## 2011-07-19 NOTE — Telephone Encounter (Signed)
Advised patient we do not have any appts available today and she did not want to wait until the appt on Wednesday.  She stated she would go to Urgent Care.

## 2011-07-19 NOTE — Telephone Encounter (Signed)
[phone] Pt would like to be seen today if possible she has chest congestion, started off with allergies ,dry cough,temp has been going up and down.  Offered pt something for Wednesday she would like to be seen sooner. Please call pt

## 2011-07-26 ENCOUNTER — Other Ambulatory Visit: Payer: Self-pay | Admitting: Internal Medicine

## 2011-07-26 MED ORDER — SPIRONOLACTONE 50 MG PO TABS: 50.0000 mg | ORAL_TABLET | Freq: Every day | ORAL | Status: DC

## 2011-08-19 ENCOUNTER — Other Ambulatory Visit: Payer: Self-pay | Admitting: Internal Medicine

## 2011-08-19 MED ORDER — ROSUVASTATIN CALCIUM 20 MG PO TABS: 20.0000 mg | ORAL_TABLET | Freq: Every day | ORAL | Status: DC

## 2011-09-13 ENCOUNTER — Other Ambulatory Visit: Payer: Self-pay | Admitting: *Deleted

## 2011-09-13 DIAGNOSIS — F419 Anxiety disorder, unspecified: Secondary | ICD-10-CM

## 2011-09-13 MED ORDER — ALPRAZOLAM 0.5 MG PO TABS: 0.5000 mg | ORAL_TABLET | Freq: Two times a day (BID) | ORAL | Status: DC | PRN

## 2011-09-16 ENCOUNTER — Other Ambulatory Visit: Payer: Self-pay | Admitting: Internal Medicine

## 2011-09-16 DIAGNOSIS — E1122 Type 2 diabetes mellitus with diabetic chronic kidney disease: Secondary | ICD-10-CM

## 2011-09-16 NOTE — Telephone Encounter (Signed)
PT IS CALLING CHECKING ON HER RX.  SHE STATED THAT HER PHARMACY HAS BEEN TRYING TO CONTACT YOU ALL OF HER MEDS [facility] [address]

## 2011-09-17 ENCOUNTER — Other Ambulatory Visit: Payer: Self-pay | Admitting: *Deleted

## 2011-09-17 NOTE — Telephone Encounter (Signed)
Opened in error, xanax refill was already granted, I called it in to pharmacy

## 2011-09-29 ENCOUNTER — Ambulatory Visit (INDEPENDENT_AMBULATORY_CARE_PROVIDER_SITE_OTHER): Payer: 59 | Admitting: Internal Medicine

## 2011-09-29 ENCOUNTER — Encounter: Payer: Self-pay | Admitting: Internal Medicine

## 2011-09-29 VITALS — BP 124/56 | HR 69 | Temp 98.3°F | Resp 16 | Ht 63.75 in | Wt 193.5 lb

## 2011-09-29 DIAGNOSIS — R079 Chest pain, unspecified: Secondary | ICD-10-CM

## 2011-09-29 DIAGNOSIS — E1122 Type 2 diabetes mellitus with diabetic chronic kidney disease: Secondary | ICD-10-CM

## 2011-09-29 DIAGNOSIS — Z1239 Encounter for other screening for malignant neoplasm of breast: Secondary | ICD-10-CM

## 2011-09-29 DIAGNOSIS — Z0000 Encounter for general adult medical examination without abnormal findings: Secondary | ICD-10-CM

## 2011-09-29 DIAGNOSIS — E785 Hyperlipidemia, unspecified: Secondary | ICD-10-CM

## 2011-09-29 DIAGNOSIS — I10 Essential (primary) hypertension: Secondary | ICD-10-CM

## 2011-09-29 DIAGNOSIS — Z1211 Encounter for screening for malignant neoplasm of colon: Secondary | ICD-10-CM

## 2011-09-29 DIAGNOSIS — D631 Anemia in chronic kidney disease: Secondary | ICD-10-CM

## 2011-09-29 DIAGNOSIS — N189 Chronic kidney disease, unspecified: Secondary | ICD-10-CM

## 2011-09-29 DIAGNOSIS — N058 Unspecified nephritic syndrome with other morphologic changes: Secondary | ICD-10-CM

## 2011-09-29 DIAGNOSIS — E119 Type 2 diabetes mellitus without complications: Secondary | ICD-10-CM

## 2011-09-29 DIAGNOSIS — E1129 Type 2 diabetes mellitus with other diabetic kidney complication: Secondary | ICD-10-CM

## 2011-09-29 DIAGNOSIS — K5900 Constipation, unspecified: Secondary | ICD-10-CM

## 2011-09-29 NOTE — Patient Instructions (Addendum)
Please return for fasting bloodwork as soon as you can.    You can take the furosemide in the  morning with your spironolactone in the morning,  And a second dose of furosemide right after lunch.  Avoid drinking more than 3 or 4 bottles of water.    Remember that lumch meat,  Canned soups, and diet sodas still have some sodium in them.  Remember that the batter that your fish is fried in has sodium in it.   For your constipation you should take a daily dose of either: miralax, metamucil, citrucel or benefiber (all are fiber laxatives)    NOT LACTULOSE OR CITRATE OF MAGNESIUM OR EX LAX (save these for severe constipation)

## 2011-09-29 NOTE — Progress Notes (Signed)
Patient ID: [name], female   DOB: April 26, 1936, 76 y.o.   MRN: [id number] The patient is here for annual Medicare wellness examination and management of other chronic and acute problems.  Cc is bloating and constipation.      The risk factors are reflected in the social history.  The roster of all physicians providing medical care to patient - is listed in the Snapshot section of the chart.  Activities of daily living:  The patient is 100% independent in all ADLs: dressing, toileting, feeding as well as independent mobility  Home safety : The patient has smoke detectors in the home. They wear seatbelts.  There are no firearms at home. There is no violence in the home.   There is no risks for hepatitis, STDs or HIV. There is no   history of blood transfusion. They have no travel history to infectious disease endemic areas of the world.  The patient has seen their dentist in the last six month. They have seen their eye doctor in the last year. They admit to slight hearing difficulty with regard to whispered voices and some television programs.  They have deferred audiologic testing in the last year.  They do not  have excessive sun exposure. Discussed the need for sun protection: hats, long sleeves and use of sunscreen if there is significant sun exposure.   Diet: the importance of a healthy diet is discussed. They do have a healthy diet.  The benefits of regular aerobic exercise were discussed. She walks 4 times per week ,  20 minutes.   Depression screen: there are no signs or vegative symptoms of depression- irritability, change in appetite, anhedonia, sadness/tearfullness.  Cognitive assessment: the patient manages all their financial and personal affairs and is actively engaged. They could relate day,date,year and events; recalled 2/3 objects at 3 minutes; performed clock-face test normally.  The following portions of the patient's history were reviewed and updated as appropriate:  allergies, current medications, past family history, past medical history,  past surgical history, past social history  and problem list.  Visual acuity was not assessed per patient preference since she has regular follow up with her ophthalmologist. Hearing and body mass index were assessed and reviewed.   During the course of the visit the patient's status on recommended screening and preventive services was updated  including : fall prevention , diabetes screening, nutrition counseling, colorectal cancer screening, and recommended immunizations.    BP 124/56  Pulse 69  Temp(Src) 98.3 F (36.8 C) (Oral)  Resp 16  Ht 5' 3.75" (1.619 m)  Wt 193 lb 8 oz (87.771 kg)  BMI 33.48 kg/m2  SpO2 99%  General appearance: alert, cooperative and appears stated age Ears: normal TM's and external ear canals both ears Throat: lips, mucosa, and tongue normal; teeth and gums normal Neck: no adenopathy, no carotid bruit, supple, symmetrical, trachea midline and thyroid not enlarged, symmetric, no tenderness/mass/nodules Back: symmetric, no curvature. ROM normal. No CVA tenderness. Lungs: clear to auscultation bilaterally Heart: regular rate and rhythm, S1, S2 normal, no murmur, click, rub or gallop Abdomen: soft, non-tender; bowel sounds normal; no masses,  no organomegaly Pulses: 2+ and symmetric Skin: Skin color, texture, turgor normal. No rashes or lesions Lymph nodes: Cervical, supraclavicular, and axillary nodes normal.  Assessment and Plan:  Chest pain with normal coronary angiography She has had 2 cardiac catheterizations for chest pain, one in 2004, repeated in Dec 2012 during [facility] admission.  Minimal CAd seen,  25% LAD.  Normal  LVEF, valves and normal renals . ([place])  Hypertension Currently well controlled requiring the use of 5 medications.  There were no signs of RAS by Dec 2012 cardiac cath by [facility].  Hyperlipidemia LDL goal < 100 Controlled with rosuvastatin an dat gaol  by Dec labs.  Repeat labs and lfts are due  Diabetes mellitus with chronic kidney disease Her Cr has been stable and her diabetes is managed with minimal medications, a1c was 6.5 in december on metformin alone.  LDL is at [facility], she takes a baby aspirin daily.  And takes ARB and ACE due to resistant hypertension. Labs due, reminder fr annual eye exam given.   Screening for colon cancer Done in 2008 by [facility]ium for hematochezia, only internal hemorrhoids were found.   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  Last hgb 11.0 Dec 2012.    Updated Medication List Outpatient Encounter Prescriptions as of 09/29/2011  Medication Sig Dispense Refill  . ALPRAZolam (XANAX) 0.5 MG tablet Take 1 tablet (0.5 mg total) by mouth 2 (two) times daily as needed for anxiety.  60 tablet  4  . aspirin 81 MG tablet Take 81 mg by mouth daily.        Marland Kitchen atenolol (TENORMIN) 100 MG tablet Take 1 tablet (100 mg total) by mouth 2 (two) times daily.  60 tablet  11  . cloNIDine (CATAPRES) 0.2 MG tablet Take 1 tablet (0.2 mg total) by mouth 2 (two) times daily.  60 tablet  6  . esomeprazole (NEXIUM) 40 MG capsule Take 1 capsule (40 mg total) by mouth daily before breakfast.  30 capsule  11  . febuxostat (ULORIC) 40 MG tablet Take 1 tablet (40 mg total) by mouth daily.  30 tablet  5  . furosemide (LASIX) 40 MG tablet Take 1 tablet (40 mg total) by mouth 2 (two) times daily.  60 tablet  3  . lisinopril (PRINIVIL,ZESTRIL) 40 MG tablet Take 1 tablet (40 mg total) by mouth 2 (two) times daily.  60 tablet  5  . metFORMIN (GLUCOPHAGE) 500 MG tablet Take 1 tablet (500 mg total) by mouth 2 (two) times daily with a meal.  60 tablet  6  . rosuvastatin (CRESTOR) 20 MG tablet Take 1 tablet (20 mg total) by mouth daily.  30 tablet  3  . spironolactone (ALDACTONE) 50 MG tablet Take 1 tablet (50 mg total) by mouth daily.  30 tablet  5  . valsartan (DIOVAN) 320 MG tablet Take 1  tablet (320 mg total) by mouth daily.  30 tablet  11  . DISCONTD: conjugated estrogens (PREMARIN) vaginal cream Place vaginally 2 (two) times a week.        Marland Kitchen DISCONTD: meloxicam (MOBIC) 7.5 MG tablet Take 7.5 mg by mouth daily.        Marland Kitchen DISCONTD: pantoprazole (PROTONIX) 40 MG tablet Take 40 mg by mouth daily.        Marland Kitchen DISCONTD: polyethylene glycol (MIRALAX / GLYCOLAX) packet Take 17 g by mouth as needed.        Marland Kitchen DISCONTD: potassium chloride SA (K-DUR,KLOR-CON) 20 MEQ tablet Take 20 mEq by mouth daily.        Marland Kitchen DISCONTD: traMADol (ULTRAM) 50 MG tablet Take 50 mg by mouth 4 (four) times daily.

## 2011-09-30 ENCOUNTER — Other Ambulatory Visit: Payer: 59

## 2011-10-03 ENCOUNTER — Encounter: Payer: Self-pay | Admitting: Internal Medicine

## 2011-10-03 DIAGNOSIS — E1165 Type 2 diabetes mellitus with hyperglycemia: Secondary | ICD-10-CM | POA: Insufficient documentation

## 2011-10-03 DIAGNOSIS — D631 Anemia in chronic kidney disease: Secondary | ICD-10-CM | POA: Insufficient documentation

## 2011-10-03 DIAGNOSIS — E1129 Type 2 diabetes mellitus with other diabetic kidney complication: Secondary | ICD-10-CM | POA: Insufficient documentation

## 2011-10-03 MED ORDER — ESOMEPRAZOLE MAGNESIUM 40 MG PO CPDR: 40.0000 mg | DELAYED_RELEASE_CAPSULE | Freq: Every day | ORAL | Status: DC

## 2011-10-03 MED ORDER — VALSARTAN 320 MG PO TABS: 320.0000 mg | ORAL_TABLET | Freq: Every day | ORAL | Status: DC

## 2011-10-03 MED ORDER — SPIRONOLACTONE 50 MG PO TABS: 50.0000 mg | ORAL_TABLET | Freq: Every day | ORAL | Status: DC

## 2011-10-03 MED ORDER — ATENOLOL 100 MG PO TABS: 100.0000 mg | ORAL_TABLET | Freq: Two times a day (BID) | ORAL | Status: DC

## 2011-10-03 MED ORDER — FEBUXOSTAT 40 MG PO TABS: 40.0000 mg | ORAL_TABLET | Freq: Every day | ORAL | Status: DC

## 2011-10-03 MED ORDER — MELOXICAM 7.5 MG PO TABS: 7.5000 mg | ORAL_TABLET | Freq: Every day | ORAL | Status: DC

## 2011-10-03 MED ORDER — CLONIDINE HCL 0.2 MG PO TABS: 0.2000 mg | ORAL_TABLET | Freq: Two times a day (BID) | ORAL | Status: DC

## 2011-10-03 MED ORDER — POLYETHYLENE GLYCOL 3350 17 G PO PACK: 17.0000 g | PACK | ORAL | Status: DC | PRN

## 2011-10-03 MED ORDER — METFORMIN HCL 500 MG PO TABS: 500.0000 mg | ORAL_TABLET | Freq: Two times a day (BID) | ORAL | Status: DC

## 2011-10-03 MED ORDER — POTASSIUM CHLORIDE CRYS ER 20 MEQ PO TBCR: 20.0000 meq | EXTENDED_RELEASE_TABLET | Freq: Every day | ORAL | Status: DC

## 2011-10-03 MED ORDER — PANTOPRAZOLE SODIUM 40 MG PO TBEC: 40.0000 mg | DELAYED_RELEASE_TABLET | Freq: Every day | ORAL | Status: DC

## 2011-10-03 MED ORDER — LISINOPRIL 40 MG PO TABS: 40.0000 mg | ORAL_TABLET | Freq: Two times a day (BID) | ORAL | Status: DC

## 2011-10-03 MED ORDER — TRAMADOL HCL 50 MG PO TABS: 50.0000 mg | ORAL_TABLET | Freq: Four times a day (QID) | ORAL | Status: DC

## 2011-10-03 MED ORDER — FUROSEMIDE 40 MG PO TABS: 40.0000 mg | ORAL_TABLET | Freq: Two times a day (BID) | ORAL | Status: DC

## 2011-10-03 MED ORDER — ESTROGENS, CONJUGATED 0.625 MG/GM VA CREA: TOPICAL_CREAM | VAGINAL | Status: DC

## 2011-10-03 NOTE — Assessment & Plan Note (Addendum)
Currently well controlled requiring the use of 5 medications.  There were no signs of RAS by Dec 2012 cardiac cath by [facility].   

## 2011-10-03 NOTE — Assessment & Plan Note (Signed)
Done in 2008 by Midge Minium for hematochezia, only internal hemorrhoids were found.

## 2011-10-03 NOTE — Assessment & Plan Note (Signed)
She has had 2 cardiac catheterizations for chest pain, one in 2004, repeated in Dec 2012 during [facility] admission.  Minimal CAd seen,  25% LAD.  Normal LVEF, valves and normal renals . ([place])

## 2011-10-03 NOTE — Assessment & Plan Note (Addendum)
Her Cr has been stable and her diabetes is managed with minimal medications, a1c was 6.5 in december on metformin alone.  LDL is at [facility], she takes a baby aspirin daily.  And takes ARB and ACE due to resistant hypertension. Labs due, reminder fr annual eye exam given.

## 2011-10-03 NOTE — Assessment & Plan Note (Signed)
Prior workup normal for deficiencies, myltiple myeloma and GI Losses.  Last hgb 11.0 Dec 2012.

## 2011-10-03 NOTE — Assessment & Plan Note (Signed)
Controlled with rosuvastatin an dat gaol by Dec labs.  Repeat labs and lfts are due

## 2011-10-05 ENCOUNTER — Emergency Department: Payer: Self-pay | Admitting: Emergency Medicine

## 2011-10-05 LAB — COMPREHENSIVE METABOLIC PANEL
Albumin: 3.5 g/dL (ref 3.4–5.0)
Alkaline Phosphatase: 133 U/L (ref 50–136)
BUN: 23 mg/dL — ABNORMAL HIGH (ref 7–18)
Chloride: 104 mmol/L (ref 98–107)
Co2: 27 mmol/L (ref 21–32)
Creatinine: 1.53 mg/dL — ABNORMAL HIGH (ref 0.60–1.30)
EGFR (African American): 39 — ABNORMAL LOW
SGOT(AST): 21 U/L (ref 15–37)
Sodium: 140 mmol/L (ref 136–145)
Total Protein: 7.1 g/dL (ref 6.4–8.2)

## 2011-10-05 LAB — CBC
HCT: 28.3 % — ABNORMAL LOW (ref 35.0–47.0)
RBC: 3.06 10*6/uL — ABNORMAL LOW (ref 3.80–5.20)
RDW: 12.8 % (ref 11.5–14.5)

## 2011-11-03 ENCOUNTER — Other Ambulatory Visit (INDEPENDENT_AMBULATORY_CARE_PROVIDER_SITE_OTHER): Payer: 59 | Admitting: *Deleted

## 2011-11-03 ENCOUNTER — Ambulatory Visit: Payer: Self-pay | Admitting: Internal Medicine

## 2011-11-03 DIAGNOSIS — N189 Chronic kidney disease, unspecified: Secondary | ICD-10-CM

## 2011-11-03 DIAGNOSIS — K5900 Constipation, unspecified: Secondary | ICD-10-CM

## 2011-11-03 DIAGNOSIS — N058 Unspecified nephritic syndrome with other morphologic changes: Secondary | ICD-10-CM

## 2011-11-03 DIAGNOSIS — D631 Anemia in chronic kidney disease: Secondary | ICD-10-CM

## 2011-11-03 DIAGNOSIS — E1129 Type 2 diabetes mellitus with other diabetic kidney complication: Secondary | ICD-10-CM

## 2011-11-03 DIAGNOSIS — E1122 Type 2 diabetes mellitus with diabetic chronic kidney disease: Secondary | ICD-10-CM

## 2011-11-03 DIAGNOSIS — E785 Hyperlipidemia, unspecified: Secondary | ICD-10-CM

## 2011-11-03 LAB — LIPID PANEL
Cholesterol: 127 mg/dL (ref 0–200)
LDL Cholesterol: 61 mg/dL (ref 0–99)

## 2011-11-03 LAB — TSH: TSH: 3.74 u[IU]/mL (ref 0.35–5.50)

## 2011-11-03 LAB — CBC WITH DIFFERENTIAL/PLATELET
Eosinophils Absolute: 0 10*3/uL (ref 0.0–0.7)
Eosinophils Relative: 0.7 % (ref 0.0–5.0)
MCV: 95.5 fl (ref 78.0–100.0)
Monocytes Absolute: 0.3 10*3/uL (ref 0.1–1.0)
Neutrophils Relative %: 47.1 % (ref 43.0–77.0)
Platelets: 219 10*3/uL (ref 150.0–400.0)
WBC: 3.4 10*3/uL — ABNORMAL LOW (ref 4.5–10.5)

## 2011-11-04 LAB — COMPLETE METABOLIC PANEL WITH GFR
AST: 14 U/L (ref 0–37)
Albumin: 3.8 g/dL (ref 3.5–5.2)
BUN: 27 mg/dL — ABNORMAL HIGH (ref 6–23)
CO2: 27 mEq/L (ref 19–32)
Calcium: 9.7 mg/dL (ref 8.4–10.5)
Chloride: 99 mEq/L (ref 96–112)
GFR, Est African American: 34 mL/min — ABNORMAL LOW
Glucose, Bld: 103 mg/dL — ABNORMAL HIGH (ref 70–99)
Potassium: 4.3 mEq/L (ref 3.5–5.3)

## 2011-11-08 ENCOUNTER — Emergency Department: Payer: Self-pay | Admitting: *Deleted

## 2011-11-08 LAB — CBC WITH DIFFERENTIAL/PLATELET
Basophil %: 0.6 %
Lymphocyte %: 35.8 %
MCHC: 35.5 g/dL (ref 32.0–36.0)
Neutrophil %: 51.3 %
Platelet: 204 10*3/uL (ref 150–440)
RDW: 13 % (ref 11.5–14.5)

## 2011-11-08 LAB — COMPREHENSIVE METABOLIC PANEL
Albumin: 3.5 g/dL (ref 3.4–5.0)
Anion Gap: 8 (ref 7–16)
BUN: 29 mg/dL — ABNORMAL HIGH (ref 7–18)
Bilirubin,Total: 0.2 mg/dL (ref 0.2–1.0)
Chloride: 104 mmol/L (ref 98–107)
Co2: 28 mmol/L (ref 21–32)
Creatinine: 1.6 mg/dL — ABNORMAL HIGH (ref 0.60–1.30)
EGFR (African American): 37 — ABNORMAL LOW
Glucose: 133 mg/dL — ABNORMAL HIGH (ref 65–99)
Osmolality: 287 (ref 275–301)
Potassium: 3.8 mmol/L (ref 3.5–5.1)
SGOT(AST): 22 U/L (ref 15–37)
Sodium: 140 mmol/L (ref 136–145)
Total Protein: 7.1 g/dL (ref 6.4–8.2)

## 2011-11-08 LAB — PROTIME-INR
INR: 0.9
Prothrombin Time: 12.7 secs (ref 11.5–14.7)

## 2011-11-08 LAB — APTT: Activated PTT: 32.5 secs (ref 23.6–35.9)

## 2011-11-09 ENCOUNTER — Ambulatory Visit (INDEPENDENT_AMBULATORY_CARE_PROVIDER_SITE_OTHER): Payer: 59 | Admitting: Internal Medicine

## 2011-11-09 ENCOUNTER — Encounter: Payer: Self-pay | Admitting: Internal Medicine

## 2011-11-09 VITALS — BP 140/52 | HR 75 | Temp 98.4°F

## 2011-11-09 DIAGNOSIS — N189 Chronic kidney disease, unspecified: Secondary | ICD-10-CM

## 2011-11-09 DIAGNOSIS — K644 Residual hemorrhoidal skin tags: Secondary | ICD-10-CM

## 2011-11-09 DIAGNOSIS — R112 Nausea with vomiting, unspecified: Secondary | ICD-10-CM

## 2011-11-09 DIAGNOSIS — D631 Anemia in chronic kidney disease: Secondary | ICD-10-CM

## 2011-11-09 DIAGNOSIS — D649 Anemia, unspecified: Secondary | ICD-10-CM

## 2011-11-09 MED ORDER — PROMETHAZINE HCL 25 MG/ML IJ SOLN
25.0000 mg | Freq: Once | INTRAMUSCULAR | Status: AC
  Administered 2011-11-09: 25 mg via INTRAMUSCULAR

## 2011-11-09 MED ORDER — ONDANSETRON HCL 8 MG PO TABS: 8.0000 mg | ORAL_TABLET | Freq: Three times a day (TID) | ORAL | Status: AC | PRN

## 2011-11-09 NOTE — Progress Notes (Signed)
Patient ID: [name], female   DOB: 06/05/1935, 76 y.o.   MRN: [id number] Patient Active Problem List  Diagnosis  . Screening for colon cancer  . Screening for breast cancer  . Hypertension  . Hyperlipidemia LDL goal < 100  . Screening for osteoporosis  . Chest pain with normal coronary angiography  . Constipation  . Diabetes mellitus with chronic kidney disease  . Anemia of chronic renal failure  . Nausea and vomiting in adult patient    Subjective:  CC:   Chief Complaint  Patient presents with  . Follow-up    F/U to discuss labs and ER F/U from hemorrhoids  . Emesis    weakness, abdominal pain this morning    HPI:   [name] Cheelyis a 76 y.o. female who presents with General malaise. Patient was brought in today to discuss recent labs indicating worsening anemia. She has not been feeling well since Saturday night, tinnitus ago.. She states she had an episode of bright red blood which occurred after a bowel movement. She felt that the amount of blood was considerable and went to the ER and was diagnosed with hemorrhoids after a rectal exam by ER doctor. She was given prescriptions for several hemorrhoidal topical ointments but has not filled in yet. She began having nausea and vomiting today and just before arriving for her visit. She did have an episode of emesis in the bathroom. And they're currently continues to endorse clumsiness. Her last colonoscopy was in 2011 her 2004 by Dr. Doristine Counter which noted moderate internal hemorrhoids.   Past Medical History  Diagnosis Date  . Diabetes mellitus   . GERD (gastroesophageal reflux disease)   . Hypertension     Past Surgical History  Procedure Date  . Cholecystectomy   . Abdominal hysterectomy   . Back surgery   . Arthroplasty w/ arthroscopy medial / lateral compartment knee Feb 2011    [name]         The following portions of the patient's history were reviewed and updated as appropriate: Allergies, current  medications, and problem list.    Review of Systems:   12 Pt  review of systems was negative except those addressed in the HPI,     History   Social History  . Marital Status: Single    Spouse Name: N/A    Number of Children: N/A  . Years of Education: N/A   Occupational History  . Not on file.   Social History Main Topics  . Smoking status: Former Smoker -- 30 years    Types: Cigarettes    Quit date: 09/28/2001  . Smokeless tobacco: Current User    Types: Snuff  . Alcohol Use: No  . Drug Use: No  . Sexually Active: Not on file   Other Topics Concern  . Not on file   Social History Narrative  . No narrative on file    Objective:  BP 140/52  Pulse 75  Temp 98.4 F (36.9 C) (Oral)  SpO2 99%  General appearance: alert, cooperative and appears stated age. uncomfortable secondary to nausea an recent emesis.  Ears: normal TM's and external ear canals both ears Throat: lips, mucosa, and tongue normal; teeth and gums normal Neck: no adenopathy, no carotid bruit, supple, symmetrical, trachea midline and thyroid not enlarged, symmetric, no tenderness/mass/nodules Back: symmetric, no curvature. ROM normal. No CVA tenderness. Lungs: clear to auscultation bilaterally Heart: regular rate and rhythm, S1, S2 normal, no murmur, click, rub or gallop Abdomen: soft,diffusely tender  without guarding  bowel sounds hypoactive,  no masses,  no organomegaly Pulses: 2+ and symmetric Skin: Skin color, texture, turgor normal. No rashes or lesions Lymph nodes: Cervical, supraclavicular, and axillary nodes normal.  Assessment and Plan:  Nausea and vomiting in adult patient This is a rather sudden onset it may be a community-acquired gastroenteritis. She was given a intra-intramuscular injection of promethazine for nausea and sent home with a clear liquid diet and a prescription for oral Phenergan. She was instructed to return to the ER if she was on it able to keep down liquids over  the next 8 hours.  Anemia of chronic renal failure She has a history of anemia secondary chronic kidney disease which has worsened recently. She is up-to-date on colon cancer screening is her anemia may be worsening secondary to recurrent hemorrhoidal bleedings. She is requesting definitive treatment for this and we will will be referred to Dr. Doristine Counter for removal of hemorrhoids. I will have her return when she is feeling better for iron studies and erythropoietin level. She may benefit from Aranesp injections depending on her Depo level and   Updated Medication List Outpatient Encounter Prescriptions as of 11/09/2011  Medication Sig Dispense Refill  . ALPRAZolam (XANAX) 0.5 MG tablet Take 1 tablet (0.5 mg total) by mouth 2 (two) times daily as needed for anxiety.  60 tablet  4  . aspirin 81 MG tablet Take 81 mg by mouth daily.        Marland Kitchen atenolol (TENORMIN) 100 MG tablet Take 1 tablet (100 mg total) by mouth 2 (two) times daily.  60 tablet  5  . cloNIDine (CATAPRES) 0.2 MG tablet Take 1 tablet (0.2 mg total) by mouth 2 (two) times daily.  60 tablet  6  . conjugated estrogens (PREMARIN) vaginal cream Place vaginally 2 (two) times a week.  42.5 g  3  . esomeprazole (NEXIUM) 40 MG capsule Take 1 capsule (40 mg total) by mouth daily before breakfast.  30 capsule  5  . febuxostat (ULORIC) 40 MG tablet Take 1 tablet (40 mg total) by mouth daily.  30 tablet  5  . furosemide (LASIX) 40 MG tablet Take 1 tablet (40 mg total) by mouth 2 (two) times daily.  60 tablet  3  . lisinopril (PRINIVIL,ZESTRIL) 40 MG tablet Take 1 tablet (40 mg total) by mouth 2 (two) times daily.  60 tablet  5  . meloxicam (MOBIC) 7.5 MG tablet Take 1 tablet (7.5 mg total) by mouth daily.  30 tablet  5  . metFORMIN (GLUCOPHAGE) 500 MG tablet Take 1 tablet (500 mg total) by mouth 2 (two) times daily with a meal.  60 tablet  6  . pantoprazole (PROTONIX) 40 MG tablet Take 1 tablet (40 mg total) by mouth daily.  30 tablet  5  .  polyethylene glycol (MIRALAX / GLYCOLAX) packet Take 17 g by mouth as needed.  14 each  5  . potassium chloride SA (K-DUR,KLOR-CON) 20 MEQ tablet Take 1 tablet (20 mEq total) by mouth daily.  30 tablet  5  . rosuvastatin (CRESTOR) 20 MG tablet Take 1 tablet (20 mg total) by mouth daily.  30 tablet  3  . spironolactone (ALDACTONE) 50 MG tablet Take 1 tablet (50 mg total) by mouth daily.  30 tablet  5  . traMADol (ULTRAM) 50 MG tablet Take 1 tablet (50 mg total) by mouth 4 (four) times daily.  30 tablet  3  . valsartan (DIOVAN) 320 MG tablet Take 1  tablet (320 mg total) by mouth daily.  30 tablet  5  . ondansetron (ZOFRAN) 8 MG tablet Take 1 tablet (8 mg total) by mouth every 8 (eight) hours as needed for nausea.  30 tablet  0  . promethazine (PHENERGAN) injection 25 mg          Orders Placed This Encounter  Procedures  . CBC with Differential  . Basic metabolic panel  . Lipase  . Hepatic function panel  . Erythropoietin  . Ferritin  . IBC panel  . Protein electrophoresis, serum  . IFE, Urine (with Total Protein)  . COMPLETE METABOLIC PANEL WITH GFR  . Ambulatory referral to General Surgery    No Follow-up on file.

## 2011-11-09 NOTE — Patient Instructions (Addendum)
I am sending a medicine for nausea to [facility].  Please take it every 6 to 8 hours as needed for nausea,  And simplify your diet to clear liquids (see atttached)  If you are not able to keep dow liquids by tonight,  Go to the ER for IV fluids   We are referring you to Dr. Lemar Livings for a colonoscopy and hemorrhoid management

## 2011-11-10 DIAGNOSIS — R112 Nausea with vomiting, unspecified: Secondary | ICD-10-CM | POA: Insufficient documentation

## 2011-11-10 LAB — HEPATIC FUNCTION PANEL
ALT: 14 U/L (ref 0–35)
AST: 23 U/L (ref 0–37)
Albumin: 4.3 g/dL (ref 3.5–5.2)
Total Protein: 7.8 g/dL (ref 6.0–8.3)

## 2011-11-10 LAB — BASIC METABOLIC PANEL
BUN: 27 mg/dL — ABNORMAL HIGH (ref 6–23)
Chloride: 104 mEq/L (ref 96–112)
Creatinine, Ser: 1.7 mg/dL — ABNORMAL HIGH (ref 0.4–1.2)
Glucose, Bld: 85 mg/dL (ref 70–99)

## 2011-11-10 LAB — CBC WITH DIFFERENTIAL/PLATELET
Basophils Relative: 0 % (ref 0.0–3.0)
Eosinophils Absolute: 0 10*3/uL (ref 0.0–0.7)
Eosinophils Relative: 0.1 % (ref 0.0–5.0)
Hemoglobin: 11.2 g/dL — ABNORMAL LOW (ref 12.0–15.0)
Lymphocytes Relative: 10 % — ABNORMAL LOW (ref 12.0–46.0)
MCHC: 33.7 g/dL (ref 30.0–36.0)
MCV: 95.6 fl (ref 78.0–100.0)
Neutro Abs: 5.5 10*3/uL (ref 1.4–7.7)
RBC: 3.48 Mil/uL — ABNORMAL LOW (ref 3.87–5.11)

## 2011-11-10 LAB — LIPASE: Lipase: 79 U/L — ABNORMAL HIGH (ref 11.0–59.0)

## 2011-11-10 NOTE — Assessment & Plan Note (Signed)
This is a rather sudden onset it may be a community-acquired gastroenteritis. She was given a intra-intramuscular injection of promethazine for nausea and sent home with a clear liquid diet and a prescription for oral Phenergan. She was instructed to return to the ER if she was on it able to keep down liquids over the next 8 hours.

## 2011-11-10 NOTE — Assessment & Plan Note (Addendum)
She has a history of anemia secondary chronic kidney disease which has worsened recently. She is up-to-date on colon cancer screening is her anemia may be worsening secondary to recurrent hemorrhoidal bleedings. She is requesting definitive treatment for this and we will will be referred to Dr. Doristine Counter for removal of hemorrhoids. I will have her return when she is feeling better for iron studies and erythropoietin level. She may benefit from Aranesp injections depending on her Depo level and

## 2011-11-16 ENCOUNTER — Encounter: Payer: Self-pay | Admitting: Internal Medicine

## 2011-11-18 ENCOUNTER — Telehealth: Payer: Self-pay | Admitting: Internal Medicine

## 2011-11-18 LAB — HM MAMMOGRAPHY: HM MAMMO: NORMAL

## 2011-11-18 NOTE — Telephone Encounter (Signed)
Does patient need blood work prior to coming for her appointment on 7.31.13.

## 2011-11-19 NOTE — Telephone Encounter (Signed)
Pending bloodwork is all ordered for Monday

## 2011-11-22 ENCOUNTER — Emergency Department: Payer: Self-pay | Admitting: Emergency Medicine

## 2011-11-24 ENCOUNTER — Ambulatory Visit (INDEPENDENT_AMBULATORY_CARE_PROVIDER_SITE_OTHER): Payer: 59 | Admitting: Internal Medicine

## 2011-11-24 ENCOUNTER — Encounter: Payer: Self-pay | Admitting: Internal Medicine

## 2011-11-24 ENCOUNTER — Ambulatory Visit: Payer: Self-pay | Admitting: Internal Medicine

## 2011-11-24 VITALS — BP 128/58 | HR 72 | Temp 97.8°F | Resp 18 | Wt 190.2 lb

## 2011-11-24 DIAGNOSIS — I872 Venous insufficiency (chronic) (peripheral): Secondary | ICD-10-CM

## 2011-11-24 DIAGNOSIS — Z79899 Other long term (current) drug therapy: Secondary | ICD-10-CM

## 2011-11-24 DIAGNOSIS — M549 Dorsalgia, unspecified: Secondary | ICD-10-CM

## 2011-11-24 DIAGNOSIS — D631 Anemia in chronic kidney disease: Secondary | ICD-10-CM

## 2011-11-24 DIAGNOSIS — M5380 Other specified dorsopathies, site unspecified: Secondary | ICD-10-CM

## 2011-11-24 DIAGNOSIS — K625 Hemorrhage of anus and rectum: Secondary | ICD-10-CM | POA: Insufficient documentation

## 2011-11-24 DIAGNOSIS — M62830 Muscle spasm of back: Secondary | ICD-10-CM

## 2011-11-24 DIAGNOSIS — N189 Chronic kidney disease, unspecified: Secondary | ICD-10-CM

## 2011-11-24 DIAGNOSIS — M546 Pain in thoracic spine: Secondary | ICD-10-CM

## 2011-11-24 LAB — POCT URINALYSIS DIPSTICK
Ketones, UA: NEGATIVE
Protein, UA: 30
Spec Grav, UA: 1.02
Urobilinogen, UA: 0.2
pH, UA: 5

## 2011-11-24 MED ORDER — METHOCARBAMOL 500 MG PO TABS: 500.0000 mg | ORAL_TABLET | Freq: Three times a day (TID) | ORAL | Status: AC

## 2011-11-24 MED ORDER — NABUMETONE 500 MG PO TABS: 500.0000 mg | ORAL_TABLET | Freq: Two times a day (BID) | ORAL | Status: DC

## 2011-11-24 NOTE — Patient Instructions (Addendum)
Your back muscles are in spasm. We are going to call in robaxin , a muscle relaxer,  And an anti inflammatory , nabumetone.  Please use a heating pad for 15 minutes a few times daily .      Please go to [facility] for your x rays   Try not to do a lot of bending , but please keep walking.    Please return tomorrow for your blood work .  You do not need to fast.   Referrals to Dr. Lemar Livings and Dr. Wyn Quaker are underway for your hemorrhoids and your legs

## 2011-11-24 NOTE — Progress Notes (Signed)
Patient ID: [name], female   DOB: November 14, 1935, 77 y.o.   MRN: [id number]  Patient Active Problem List  Diagnosis  . Screening for colon cancer  . Screening for breast cancer  . Hypertension  . Hyperlipidemia LDL goal < 100  . Screening for osteoporosis  . Chest pain with normal coronary angiography  . Constipation  . Diabetes mellitus with chronic kidney disease  . Anemia of chronic renal failure  . Rectal bleeding  . Venous insufficiency of leg    Subjective:  CC:   Chief Complaint  Patient presents with  . Back Pain    since Monday    HPI:   [name] Cheelyis a 76 y.o. female who presents for follow up on recent GI illness.  Her nausea and vomiting have completely resolved without incident.  [place] she has been having back pain for the last 48 hours, no recent unusual activity or housework .  Restricted lateral motion.  Pain spans at least 6 vertebrae.  NO fevers, chills, recent travel.  No Dysuria, or flank pain .   Past Medical History  Diagnosis Date  . Diabetes mellitus   . GERD (gastroesophageal reflux disease)   . Hypertension     Past Surgical History  Procedure Date  . Cholecystectomy   . Abdominal hysterectomy   . Back surgery   . Arthroplasty w/ arthroscopy medial / lateral compartment knee Feb 2011    [name]         The following portions of the patient's history were reviewed and updated as appropriate: Allergies, current medications, and problem list.    Review of Systems:  Positive for back pain .Marland Kitchen The rest of a comprehensive  review of systems was negative except those addressed in the HPI,     History   Social History  . Marital Status: Single    Spouse Name: N/A    Number of Children: N/A  . Years of Education: N/A   Occupational History  . Not on file.   Social History Main Topics  . Smoking status: Former Smoker -- 30 years    Types: Cigarettes    Quit date: 09/28/2001  . Smokeless tobacco: Current User   Types: Snuff  . Alcohol Use: No  . Drug Use: No  . Sexually Active: Not on file   Other Topics Concern  . Not on file   Social History Narrative  . No narrative on file    Objective:  BP 128/58  Pulse 72  Temp 97.8 F (36.6 C) (Oral)  Resp 18  Wt 190 lb 4 oz (86.297 kg)  SpO2 98%  General appearance: alert, cooperative and appears stated age Ears: normal TM's and external ear canals both ears Throat: lips, mucosa, and tongue normal; teeth and gums normal Neck: no adenopathy, no carotid bruit, supple, symmetrical, trachea midline and thyroid not enlarged, symmetric, no tenderness/mass/nodules Back: symmetric, no curvature. ROM normal. No CVA tenderness. Lungs: clear to auscultation bilaterally Heart: regular rate and rhythm, S1, S2 normal, no murmur, click, rub or gallop Abdomen: soft, non-tender; bowel sounds normal; no masses,  no organomegaly Pulses: 2+ and symmetric Skin: Skin color, texture, turgor normal. No rashes or lesions Lymph nodes: Cervical, supraclavicular, and axillary nodes normal.  Assessment and Plan: Rectal bleeding She has had recurrent episodes of rectal bleeding due to hemorrhoids and is requesting definitive treatment. Referral to [facility].   Anemia of chronic renal failure Her anemia has worsened recently, with stable renal function.  Repeat hgb  has improved but she is iron deficient.  Will start iron supplements.   Back pain, thoracic Exam suggests arthritis. Plain films were negative for disk disease and fractures. UA was normal. Recommend trial of nabumetone for daytime and tylenol    Updated Medication List Outpatient Encounter Prescriptions as of 11/24/2011  Medication Sig Dispense Refill  . ALPRAZolam (XANAX) 0.5 MG tablet Take 1 tablet (0.5 mg total) by mouth 2 (two) times daily as needed for anxiety.  60 tablet  4  . atenolol (TENORMIN) 100 MG tablet Take 1 tablet (100 mg total) by mouth 2 (two) times daily.  60 tablet  5  .  cloNIDine (CATAPRES) 0.2 MG tablet Take 1 tablet (0.2 mg total) by mouth 2 (two) times daily.  60 tablet  6  . esomeprazole (NEXIUM) 40 MG capsule Take 1 capsule (40 mg total) by mouth daily before breakfast.  30 capsule  5  . febuxostat (ULORIC) 40 MG tablet Take 1 tablet (40 mg total) by mouth daily.  30 tablet  5  . furosemide (LASIX) 40 MG tablet Take 1 tablet (40 mg total) by mouth 2 (two) times daily.  60 tablet  3  . lisinopril (PRINIVIL,ZESTRIL) 40 MG tablet Take 1 tablet (40 mg total) by mouth 2 (two) times daily.  60 tablet  5  . metFORMIN (GLUCOPHAGE) 500 MG tablet Take 1 tablet (500 mg total) by mouth 2 (two) times daily with a meal.  60 tablet  6  . rosuvastatin (CRESTOR) 20 MG tablet Take 1 tablet (20 mg total) by mouth daily.  30 tablet  3  . spironolactone (ALDACTONE) 50 MG tablet Take 1 tablet (50 mg total) by mouth daily.  30 tablet  5  . valsartan (DIOVAN) 320 MG tablet Take 1 tablet (320 mg total) by mouth daily.  30 tablet  5  . methocarbamol (ROBAXIN) 500 MG tablet Take 1 tablet (500 mg total) by mouth 3 (three) times daily. As needed for muscle spasm  60 tablet  0  . nabumetone (RELAFEN) 500 MG tablet Take 1 tablet (500 mg total) by mouth 2 (two) times daily.  60 tablet  0  . DISCONTD: aspirin 81 MG tablet Take 81 mg by mouth daily.        Marland Kitchen DISCONTD: conjugated estrogens (PREMARIN) vaginal cream Place vaginally 2 (two) times a week.  42.5 g  3  . DISCONTD: meloxicam (MOBIC) 7.5 MG tablet Take 1 tablet (7.5 mg total) by mouth daily.  30 tablet  5  . DISCONTD: pantoprazole (PROTONIX) 40 MG tablet Take 1 tablet (40 mg total) by mouth daily.  30 tablet  5  . DISCONTD: polyethylene glycol (MIRALAX / GLYCOLAX) packet Take 17 g by mouth as needed.  14 each  5  . DISCONTD: potassium chloride SA (K-DUR,KLOR-CON) 20 MEQ tablet Take 1 tablet (20 mEq total) by mouth daily.  30 tablet  5  . DISCONTD: traMADol (ULTRAM) 50 MG tablet Take 1 tablet (50 mg total) by mouth 4 (four) times daily.   30 tablet  3

## 2011-11-25 ENCOUNTER — Other Ambulatory Visit (INDEPENDENT_AMBULATORY_CARE_PROVIDER_SITE_OTHER): Payer: 59 | Admitting: *Deleted

## 2011-11-25 DIAGNOSIS — N039 Chronic nephritic syndrome with unspecified morphologic changes: Secondary | ICD-10-CM

## 2011-11-25 DIAGNOSIS — D631 Anemia in chronic kidney disease: Secondary | ICD-10-CM

## 2011-11-25 DIAGNOSIS — N189 Chronic kidney disease, unspecified: Secondary | ICD-10-CM

## 2011-11-25 NOTE — Addendum Note (Signed)
Addended by: Jobie Quaker on: 11/25/2011 11:39 AM   Modules accepted: Orders

## 2011-11-26 ENCOUNTER — Telehealth: Payer: Self-pay | Admitting: Internal Medicine

## 2011-11-26 LAB — UIFE/LIGHT CHAINS/TP QN, 24-HR UR

## 2011-11-26 LAB — COMPLETE METABOLIC PANEL WITH GFR
Albumin: 4.3 g/dL (ref 3.5–5.2)
CO2: 25 mEq/L (ref 19–32)
Calcium: 9.9 mg/dL (ref 8.4–10.5)
GFR, Est African American: 32 mL/min — ABNORMAL LOW
GFR, Est Non African American: 28 mL/min — ABNORMAL LOW
Glucose, Bld: 122 mg/dL — ABNORMAL HIGH (ref 70–99)
Potassium: 4.4 mEq/L (ref 3.5–5.3)
Sodium: 137 mEq/L (ref 135–145)
Total Protein: 7.2 g/dL (ref 6.0–8.3)

## 2011-11-26 NOTE — Telephone Encounter (Signed)
Left message asking patient to call back

## 2011-11-26 NOTE — Telephone Encounter (Signed)
Patient returned call and was notified.

## 2011-11-26 NOTE — Telephone Encounter (Signed)
Thoracic spine films showed no evidence of fractures.  She does have some mild degenerative changes at multiple levels which are likely the cause of her pain

## 2011-11-27 ENCOUNTER — Encounter: Payer: Self-pay | Admitting: Internal Medicine

## 2011-11-27 ENCOUNTER — Telehealth: Payer: Self-pay | Admitting: Internal Medicine

## 2011-11-27 DIAGNOSIS — M546 Pain in thoracic spine: Secondary | ICD-10-CM | POA: Insufficient documentation

## 2011-11-27 MED ORDER — FERROUS FUM-IRON POLYSACCH 162-115.2 MG PO CAPS: 1.0000 | ORAL_CAPSULE | Freq: Every day | ORAL | Status: DC

## 2011-11-27 NOTE — Telephone Encounter (Signed)
Her iron studies suggested she is iron deficiency,   Would like her to start iron supplements once daily  [facility] called into  to pharmacy.

## 2011-11-27 NOTE — Assessment & Plan Note (Signed)
She has had recurrent episodes of rectal bleeding due to hemorrhoids and is requesting definitive treatment. Referral to [facility].

## 2011-11-27 NOTE — Assessment & Plan Note (Addendum)
Exam suggests arthritis. Plain films were negative for disk disease and fractures. UA was normal. Recommend trial of nabumetone for daytime and tylenol

## 2011-11-27 NOTE — Assessment & Plan Note (Addendum)
Her anemia has worsened recently, with stable renal function.  Repeat hgb has improved but she is iron deficient.  Will start iron supplements.

## 2011-11-29 LAB — PROTEIN ELECTROPHORESIS, SERUM
Albumin ELP: 55.6 % — ABNORMAL LOW (ref 55.8–66.1)
Beta 2: 5 % (ref 3.2–6.5)
Beta Globulin: 7.1 % (ref 4.7–7.2)
Gamma Globulin: 14.4 % (ref 11.1–18.8)

## 2011-11-29 NOTE — Telephone Encounter (Signed)
Patient notified

## 2011-11-30 ENCOUNTER — Ambulatory Visit (INDEPENDENT_AMBULATORY_CARE_PROVIDER_SITE_OTHER): Payer: 59 | Admitting: Internal Medicine

## 2011-11-30 ENCOUNTER — Encounter: Payer: Self-pay | Admitting: Internal Medicine

## 2011-11-30 VITALS — BP 146/62 | HR 78 | Temp 97.9°F | Resp 16 | Wt 193.0 lb

## 2011-11-30 DIAGNOSIS — R609 Edema, unspecified: Secondary | ICD-10-CM

## 2011-11-30 DIAGNOSIS — M546 Pain in thoracic spine: Secondary | ICD-10-CM

## 2011-11-30 DIAGNOSIS — I872 Venous insufficiency (chronic) (peripheral): Secondary | ICD-10-CM

## 2011-11-30 NOTE — Progress Notes (Signed)
Patient ID: [name], female   DOB: 03/05/1936, 76 y.o.   MRN: [id number]   Patient Active Problem List  Diagnosis  . Screening for colon cancer  . Screening for breast cancer  . Hypertension  . Hyperlipidemia LDL goal < 100  . Screening for osteoporosis  . Chest pain with normal coronary angiography  . Constipation  . Diabetes mellitus with chronic kidney disease  . Anemia of chronic renal failure  . Rectal bleeding  . Venous insufficiency of leg  . Back pain, thoracic    Subjective:  CC:   Chief Complaint  Patient presents with  . Rash    on legs    HPI:   [name] Cheelyis a 76 y.o. female who presents with Lower extremity edema with progression.   Not wearing previously prescribed stockings  becuase she cannot tolerate them.  She's worried that she has a blood clot because both legs feel very tight. She has been taking furosemide 40 mg twice daily and spironolactone twice daily for management of hypertension. She has no history of congestive heart failure or orthopnea.  last night had bilateral leg pain and burning without any history of bug bites abrasions or leg ulcers.   Past Medical History  Diagnosis Date  . Diabetes mellitus   . GERD (gastroesophageal reflux disease)   . Hypertension     Past Surgical History  Procedure Date  . Cholecystectomy   . Abdominal hysterectomy   . Back surgery   . Arthroplasty w/ arthroscopy medial / lateral compartment knee Feb 2011    [name]         The following portions of the patient's history were reviewed and updated as appropriate: Allergies, current medications, and problem list.    Review of Systems:   12 Pt  review of systems was negative except those addressed in the HPI,     History   Social History  . Marital Status: Single    Spouse Name: N/A    Number of Children: N/A  . Years of Education: N/A   Occupational History  . Not on file.   Social History Main Topics  . Smoking status:  Former Smoker -- 30 years    Types: Cigarettes    Quit date: 09/28/2001  . Smokeless tobacco: Current User    Types: Snuff  . Alcohol Use: No  . Drug Use: No  . Sexually Active: Not on file   Other Topics Concern  . Not on file   Social History Narrative  . No narrative on file    Objective:  BP 146/62  Pulse 78  Temp 97.9 F (36.6 C) (Oral)  Resp 16  Wt 193 lb (87.544 kg)  SpO2 98%  General appearance: alert, cooperative and appears stated age Ears: normal TM's and external ear canals both ears Throat: lips, mucosa, and tongue normal; teeth and gums normal Neck: no adenopathy, no carotid bruit, supple, symmetrical, trachea midline and thyroid not enlarged, symmetric, no tenderness/mass/nodules Back: symmetric, no curvature. ROM normal. No CVA tenderness. Lungs: clear to auscultation bilaterally Heart: regular rate and rhythm, S1, S2 normal, no murmur, click, rub or gallop Abdomen: soft, non-tender; bowel sounds normal; no masses,  no organomegaly Pulses: 2+ and symmetric Skin: Skin color, texture, turgor normal. No rashes or lesions Lymph nodes: Cervical, supraclavicular, and axillary nodes normal.  Assessment and Plan:  Venous insufficiency of leg Suspected by exam with venous stasis  changes and edema despite use of  2 diuretics twice daily. Refer  to [facility] for evaluation   Back pain, thoracic Thoracic spine films were ordered after last visit to rule out compression fractures and  Paget's disease. Films were notable only for mild degenerative changes.   Updated Medication List Outpatient Encounter Prescriptions as of 11/30/2011  Medication Sig Dispense Refill  . ALPRAZolam (XANAX) 0.5 MG tablet Take 1 tablet (0.5 mg total) by mouth 2 (two) times daily as needed for anxiety.  60 tablet  4  . atenolol (TENORMIN) 100 MG tablet Take 1 tablet (100 mg total) by mouth 2 (two) times daily.  60 tablet  5  . cloNIDine (CATAPRES) 0.2 MG tablet Take 1 tablet (0.2 mg total) by  mouth 2 (two) times daily.  60 tablet  6  . esomeprazole (NEXIUM) 40 MG capsule Take 1 capsule (40 mg total) by mouth daily before breakfast.  30 capsule  5  . febuxostat (ULORIC) 40 MG tablet Take 1 tablet (40 mg total) by mouth daily.  30 tablet  5  . ferrous fumarate-iron polysaccharide complex (TANDEM) 162-115.2 MG CAPS Take 1 capsule by mouth daily with breakfast.  30 capsule  5  . furosemide (LASIX) 40 MG tablet Take 1 tablet (40 mg total) by mouth 2 (two) times daily.  60 tablet  3  . lisinopril (PRINIVIL,ZESTRIL) 40 MG tablet Take 1 tablet (40 mg total) by mouth 2 (two) times daily.  60 tablet  5  . metFORMIN (GLUCOPHAGE) 500 MG tablet Take 1 tablet (500 mg total) by mouth 2 (two) times daily with a meal.  60 tablet  6  . methocarbamol (ROBAXIN) 500 MG tablet Take 1 tablet (500 mg total) by mouth 3 (three) times daily. As needed for muscle spasm  60 tablet  0  . nabumetone (RELAFEN) 500 MG tablet Take 1 tablet (500 mg total) by mouth 2 (two) times daily.  60 tablet  0  . rosuvastatin (CRESTOR) 20 MG tablet Take 1 tablet (20 mg total) by mouth daily.  30 tablet  3  . spironolactone (ALDACTONE) 50 MG tablet Take 1 tablet (50 mg total) by mouth daily.  30 tablet  5  . valsartan (DIOVAN) 320 MG tablet Take 1 tablet (320 mg total) by mouth daily.  30 tablet  5     Orders Placed This Encounter  Procedures  . Ambulatory referral to Vascular Surgery    No Follow-up on file.

## 2011-11-30 NOTE — Assessment & Plan Note (Signed)
Thoracic spine films were ordered after last visit to rule out compression fractures and  Paget's disease. Films were notable only for mild degenerative changes.

## 2011-11-30 NOTE — Progress Notes (Deleted)
  Subjective:    Review of Systems:   Review of Systems  Objective:     BP 146/62  Pulse 78  Temp 97.9 F (36.6 C) (Oral)  Resp 16  Wt 193 lb (87.544 kg)  SpO2 98% Physical Exam  Exam    Assessment:

## 2011-11-30 NOTE — Assessment & Plan Note (Signed)
Suspected by exam with venous stasis  changes and edema despite use of  2 diuretics twice daily. Refer to [facility] for evaluation

## 2011-12-01 ENCOUNTER — Telehealth: Payer: Self-pay | Admitting: Internal Medicine

## 2011-12-09 ENCOUNTER — Encounter: Payer: Self-pay | Admitting: Internal Medicine

## 2012-01-03 ENCOUNTER — Encounter: Payer: Self-pay | Admitting: Internal Medicine

## 2012-01-03 ENCOUNTER — Ambulatory Visit (INDEPENDENT_AMBULATORY_CARE_PROVIDER_SITE_OTHER): Payer: 59 | Admitting: Internal Medicine

## 2012-01-03 VITALS — BP 142/58 | HR 80 | Temp 97.6°F | Resp 16 | Wt 191.5 lb

## 2012-01-03 DIAGNOSIS — I10 Essential (primary) hypertension: Secondary | ICD-10-CM

## 2012-01-03 DIAGNOSIS — I872 Venous insufficiency (chronic) (peripheral): Secondary | ICD-10-CM

## 2012-01-03 DIAGNOSIS — N289 Disorder of kidney and ureter, unspecified: Secondary | ICD-10-CM

## 2012-01-03 DIAGNOSIS — D631 Anemia in chronic kidney disease: Secondary | ICD-10-CM

## 2012-01-03 DIAGNOSIS — N039 Chronic nephritic syndrome with unspecified morphologic changes: Secondary | ICD-10-CM

## 2012-01-03 DIAGNOSIS — N189 Chronic kidney disease, unspecified: Secondary | ICD-10-CM

## 2012-01-03 DIAGNOSIS — D509 Iron deficiency anemia, unspecified: Secondary | ICD-10-CM

## 2012-01-03 LAB — CBC WITH DIFFERENTIAL/PLATELET
Basophils Relative: 0.5 % (ref 0.0–3.0)
Eosinophils Absolute: 0 10*3/uL (ref 0.0–0.7)
Eosinophils Relative: 1.1 % (ref 0.0–5.0)
Hemoglobin: 10.6 g/dL — ABNORMAL LOW (ref 12.0–15.0)
Lymphocytes Relative: 33.8 % (ref 12.0–46.0)
MCHC: 33 g/dL (ref 30.0–36.0)
MCV: 95.3 fl (ref 78.0–100.0)
Neutro Abs: 1.8 10*3/uL (ref 1.4–7.7)
RBC: 3.37 Mil/uL — ABNORMAL LOW (ref 3.87–5.11)
WBC: 3.3 10*3/uL — ABNORMAL LOW (ref 4.5–10.5)

## 2012-01-03 LAB — BASIC METABOLIC PANEL
BUN: 23 mg/dL (ref 6–23)
CO2: 28 mEq/L (ref 19–32)
Chloride: 100 mEq/L (ref 96–112)
Creatinine, Ser: 1.6 mg/dL — ABNORMAL HIGH (ref 0.4–1.2)
Glucose, Bld: 120 mg/dL — ABNORMAL HIGH (ref 70–99)

## 2012-01-03 LAB — FERRITIN: Ferritin: 23.2 ng/mL (ref 10.0–291.0)

## 2012-01-03 MED ORDER — FERROUS FUM-IRON POLYSACCH 162-115.2 MG PO CAPS: 1.0000 | ORAL_CAPSULE | Freq: Every day | ORAL | Status: DC

## 2012-01-03 NOTE — Assessment & Plan Note (Signed)
Currently well controlled requiring the use of 5 medications.  There were no signs of RAS by Dec 2012 cardiac cath by [facility].

## 2012-01-03 NOTE — Assessment & Plan Note (Signed)
Improved with iron supplements. EPO level inappropriately normal.  Refer back to Nephrology for EPO shot

## 2012-01-03 NOTE — Assessment & Plan Note (Signed)
Managed with compression stockings an d prn furosemide. .  No signs of DVT by prior ultrasound.

## 2012-01-03 NOTE — Patient Instructions (Addendum)
There is no cream available that will bleach out your legs.  The staining is from the swollen legs.    The best moisturizer for your legs is  Eucerin skin cream.  Use it daily    I am referring back to Dr. Thedore Mins, your kidney doctor to help keep your kidneys functioning as well as possible  I am rechecking your iron and hemoglobin  Today.    We will send you to Dr. Lemar Livings to get your bleeding hemorrhoid taken care of

## 2012-01-03 NOTE — Progress Notes (Signed)
Patient ID: [name], female   DOB: Dec 27, 1935, 76 y.o.   MRN: [id number]   Patient Active Problem List  Diagnosis  . Screening for colon cancer  . Screening for breast cancer  . Hypertension  . Hyperlipidemia LDL goal < 100  . Screening for osteoporosis  . Chest pain with normal coronary angiography  . Constipation  . Diabetes mellitus with chronic kidney disease  . Anemia of chronic renal failure  . Rectal bleeding  . Venous insufficiency of leg  . Back pain, thoracic    Subjective:  CC:   Chief Complaint  Patient presents with  . Follow-up    3 month    HPI:   [name] Cheelyis a 76 y.o. female who presents for follow up on venous insufficiency.  She has been wearing her compression stockings previously ordered by  Dr. Evette Cristal.  She has no open ulcers currently but is concerned about the venous stasis changes  To her legs .   Past Medical History  Diagnosis Date  . Diabetes mellitus   . GERD (gastroesophageal reflux disease)   . Hypertension     Past Surgical History  Procedure Date  . Cholecystectomy   . Abdominal hysterectomy   . Back surgery   . Arthroplasty w/ arthroscopy medial / lateral compartment knee Feb 2011    [name]         The following portions of the patient's history were reviewed and updated as appropriate: Allergies, current medications, and problem list.    Review of Systems:   12 Pt  review of systems was negative except those addressed in the HPI,     History   Social History  . Marital Status: Single    Spouse Name: N/A    Number of Children: N/A  . Years of Education: N/A   Occupational History  . Not on file.   Social History Main Topics  . Smoking status: Former Smoker -- 30 years    Types: Cigarettes    Quit date: 09/28/2001  . Smokeless tobacco: Current User    Types: Snuff  . Alcohol Use: No  . Drug Use: No  . Sexually Active: Not on file   Other Topics Concern  . Not on file   Social History  Narrative  . No narrative on file    Objective:  BP 142/58  Pulse 80  Temp 97.6 F (36.4 C) (Oral)  Resp 16  Wt 191 lb 8 oz (86.864 kg)  SpO2 95%  General appearance: alert, cooperative and appears stated age Ears: normal TM's and external ear canals both ears Throat: lips, mucosa, and tongue normal; teeth and gums normal Neck: no adenopathy, no carotid bruit, supple, symmetrical, trachea midline and thyroid not enlarged, symmetric, no tenderness/mass/nodules Back: symmetric, no curvature. ROM normal. No CVA tenderness. Lungs: clear to auscultation bilaterally Heart: regular rate and rhythm, S1, S2 normal, no murmur, click, rub or gallop Abdomen: soft, non-tender; bowel sounds normal; no masses,  no organomegaly Pulses: 2+ and symmetric Skin: Skin color, texture, turgor normal. No rashes or lesions Lymph nodes: Cervical, supraclavicular, and axillary nodes normal.  Assessment and Plan:  Venous insufficiency of leg Managed with compression stockings an d prn furosemide. .  No signs of DVT by prior ultrasound.   Hypertension Currently well controlled requiring the use of 5 medications.  There were no signs of RAS by Dec 2012 cardiac cath by [facility].    Anemia of chronic renal failure Improved with iron supplements. EPO  level inappropriately normal.  Refer back to Nephrology for EPO shot    Updated Medication List Outpatient Encounter Prescriptions as of 01/03/2012  Medication Sig Dispense Refill  . ALPRAZolam (XANAX) 0.5 MG tablet Take 1 tablet (0.5 mg total) by mouth 2 (two) times daily as needed for anxiety.  60 tablet  4  . atenolol (TENORMIN) 100 MG tablet Take 1 tablet (100 mg total) by mouth 2 (two) times daily.  60 tablet  5  . cloNIDine (CATAPRES) 0.2 MG tablet Take 1 tablet (0.2 mg total) by mouth 2 (two) times daily.  60 tablet  6  . esomeprazole (NEXIUM) 40 MG capsule Take 1 capsule (40 mg total) by mouth daily before breakfast.  30 capsule  5  .  febuxostat (ULORIC) 40 MG tablet Take 1 tablet (40 mg total) by mouth daily.  30 tablet  5  . ferrous fumarate-iron polysaccharide complex (TANDEM) 162-115.2 MG CAPS Take 1 capsule by mouth daily with breakfast.  30 capsule  5  . furosemide (LASIX) 40 MG tablet Take 1 tablet (40 mg total) by mouth 2 (two) times daily.  60 tablet  3  . lisinopril (PRINIVIL,ZESTRIL) 40 MG tablet Take 1 tablet (40 mg total) by mouth 2 (two) times daily.  60 tablet  5  . metFORMIN (GLUCOPHAGE) 500 MG tablet Take 1 tablet (500 mg total) by mouth 2 (two) times daily with a meal.  60 tablet  6  . rosuvastatin (CRESTOR) 20 MG tablet Take 1 tablet (20 mg total) by mouth daily.  30 tablet  3  . spironolactone (ALDACTONE) 50 MG tablet Take 1 tablet (50 mg total) by mouth daily.  30 tablet  5  . valsartan (DIOVAN) 320 MG tablet Take 1 tablet (320 mg total) by mouth daily.  30 tablet  5  . DISCONTD: ferrous fumarate-iron polysaccharide complex (TANDEM) 162-115.2 MG CAPS Take 1 capsule by mouth daily with breakfast.  30 capsule  5  . DISCONTD: nabumetone (RELAFEN) 500 MG tablet Take 1 tablet (500 mg total) by mouth 2 (two) times daily.  60 tablet  0     Orders Placed This Encounter  Procedures  . CBC with Differential  . Basic metabolic panel  . PTH, intact (no Ca)  . Iron and TIBC  . Ferritin  . Ambulatory referral to Nephrology    No Follow-up on file.

## 2012-01-04 LAB — IRON AND TIBC
%SAT: 19 % — ABNORMAL LOW (ref 20–55)
TIBC: 393 ug/dL (ref 250–470)

## 2012-01-04 LAB — PARATHYROID HORMONE, INTACT (NO CA): PTH: 93.5 pg/mL — ABNORMAL HIGH (ref 14.0–72.0)

## 2012-01-06 NOTE — Telephone Encounter (Signed)
This was faxed

## 2012-01-07 ENCOUNTER — Other Ambulatory Visit: Payer: Self-pay | Admitting: Internal Medicine

## 2012-01-10 ENCOUNTER — Telehealth: Payer: Self-pay | Admitting: Internal Medicine

## 2012-01-10 LAB — HM DIABETES EYE EXAM: HM Diabetic Eye Exam: NORMAL

## 2012-01-10 NOTE — Telephone Encounter (Signed)
Daughter raised concerns about signs of dementia in patient.  she is going out at night ,  Getting lost on the way home.,  Falling asleep in public places, staying up all night due to paranoia. Please make appt to come back for a 30 minute visit when available.

## 2012-01-11 NOTE — Telephone Encounter (Signed)
Left detailed message on patients home phone to call the office back to make a 30 min follow up visit.

## 2012-01-12 ENCOUNTER — Telehealth: Payer: Self-pay | Admitting: Internal Medicine

## 2012-01-12 NOTE — Telephone Encounter (Signed)
[place] called  [name] no showed with dr Thedore Mins today

## 2012-01-13 NOTE — Telephone Encounter (Signed)
Left message asking patient to return my call.

## 2012-01-18 ENCOUNTER — Encounter: Payer: Self-pay | Admitting: Internal Medicine

## 2012-01-18 ENCOUNTER — Telehealth: Payer: Self-pay | Admitting: Internal Medicine

## 2012-01-18 ENCOUNTER — Ambulatory Visit (INDEPENDENT_AMBULATORY_CARE_PROVIDER_SITE_OTHER): Payer: 59 | Admitting: Internal Medicine

## 2012-01-18 VITALS — BP 144/64 | HR 84 | Temp 97.7°F | Resp 16 | Wt 192.5 lb

## 2012-01-18 DIAGNOSIS — R4189 Other symptoms and signs involving cognitive functions and awareness: Secondary | ICD-10-CM

## 2012-01-18 NOTE — Telephone Encounter (Signed)
Please apologize to patient's daughter; I would have  tried to call her back but I was not given her phone number. The problem is that [name] scored very well on the Mini-Mental Status exam, so  I could not confront her with concerns about her memory when she was going so well on the memory test .  This to his problems seem to be more psychiatric than neurologic.

## 2012-01-18 NOTE — Telephone Encounter (Signed)
Daughter is very upset that Dr. Darrick Huntsman told patient today that her daughter had mentioned concerns about her memory.  She thinks that you could have used her age of the reason you wanted to do memory testing.  She was very upset with her daughter for calling you. Mrs. Andrey Campanile states that she has seen changes in her mom, but she wished that you wouldn't have told her that she was the reason you were testing her.  She now feels that her mom thinks she betrayed her.  She says no hard feelings, she just saw how it upset her mother that she was told today that her daughter had concerns.

## 2012-01-18 NOTE — Telephone Encounter (Signed)
Patient scheduled today with Dr. Darrick Huntsman.
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
## 2012-01-18 NOTE — Assessment & Plan Note (Signed)
Her MMSE was better than expected .  She lost points only on recall of 3 objects .  My impression is that her deficits are mild but accompanied by paranoid delusions complicated by a narcissistic personality disorder.  .  I have recommended strongly that she give up driving at night due to her recurrent episodes of confusion and impulsiveness.

## 2012-01-21 ENCOUNTER — Emergency Department: Payer: Self-pay | Admitting: Emergency Medicine

## 2012-02-10 ENCOUNTER — Ambulatory Visit (INDEPENDENT_AMBULATORY_CARE_PROVIDER_SITE_OTHER): Payer: 59 | Admitting: Internal Medicine

## 2012-02-10 ENCOUNTER — Encounter: Payer: Self-pay | Admitting: Internal Medicine

## 2012-02-10 VITALS — BP 120/76 | HR 71 | Temp 98.1°F | Ht 63.75 in | Wt 191.0 lb

## 2012-02-10 DIAGNOSIS — R4189 Other symptoms and signs involving cognitive functions and awareness: Secondary | ICD-10-CM
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
## 2012-03-22 ENCOUNTER — Other Ambulatory Visit: Payer: Self-pay | Admitting: Internal Medicine

## 2012-03-22 ENCOUNTER — Other Ambulatory Visit: Payer: Self-pay

## 2012-04-13 ENCOUNTER — Other Ambulatory Visit: Payer: Self-pay

## 2012-04-13 ENCOUNTER — Other Ambulatory Visit: Payer: Self-pay | Admitting: Internal Medicine

## 2012-04-13 MED ORDER — SPIRONOLACTONE 50 MG PO TABS: 50.0000 mg | ORAL_TABLET | Freq: Every day | ORAL | Status: DC

## 2012-04-13 MED ORDER — LISINOPRIL 40 MG PO TABS: 40.0000 mg | ORAL_TABLET | Freq: Two times a day (BID) | ORAL | Status: DC

## 2012-04-13 MED ORDER — ESOMEPRAZOLE MAGNESIUM 40 MG PO CPDR: 40.0000 mg | DELAYED_RELEASE_CAPSULE | Freq: Every day | ORAL | Status: DC

## 2012-04-13 NOTE — Telephone Encounter (Signed)
Refill request for Diovan 320 mg. Ok to refill?

## 2012-04-13 NOTE — Telephone Encounter (Signed)
Refill request for Nexium 40 mg #30 5  R Lisinopril 40 mg # 60 5 R and Spironolactone 50 mg # 30 5 R sent to [facility]

## 2012-04-16 MED ORDER — VALSARTAN 320 MG PO TABS: 320.0000 mg | ORAL_TABLET | Freq: Every day | ORAL | Status: DC

## 2012-04-21 ENCOUNTER — Other Ambulatory Visit: Payer: Self-pay | Admitting: Internal Medicine

## 2012-04-21 NOTE — Telephone Encounter (Signed)
Med filled.  

## 2012-06-06 ENCOUNTER — Other Ambulatory Visit: Payer: Self-pay | Admitting: Internal Medicine

## 2012-06-08 ENCOUNTER — Telehealth: Payer: Self-pay | Admitting: Internal Medicine

## 2012-06-08 NOTE — Telephone Encounter (Signed)
I tried calling both numbers to reschedule patients appointment for 2/14 however couldn't leave message on either phone numbers. Please call patient to rescheduled appointment.

## 2012-06-09 ENCOUNTER — Ambulatory Visit: Payer: 59 | Admitting: Internal Medicine

## 2012-06-12 ENCOUNTER — Telehealth: Payer: Self-pay | Admitting: Internal Medicine

## 2012-06-12 MED ORDER — ATENOLOL 100 MG PO TABS: ORAL_TABLET | ORAL | Status: DC

## 2012-06-12 MED ORDER — ESOMEPRAZOLE MAGNESIUM 40 MG PO CPDR: 40.0000 mg | DELAYED_RELEASE_CAPSULE | Freq: Every day | ORAL | Status: DC

## 2012-06-12 MED ORDER — VALSARTAN 320 MG PO TABS: 320.0000 mg | ORAL_TABLET | Freq: Every day | ORAL | Status: DC

## 2012-06-12 MED ORDER — CLONIDINE HCL 0.2 MG PO TABS: 0.2000 mg | ORAL_TABLET | Freq: Two times a day (BID) | ORAL | Status: DC

## 2012-06-12 MED ORDER — ALPRAZOLAM 0.5 MG PO TABS: ORAL_TABLET | ORAL | Status: DC

## 2012-06-12 MED ORDER — LISINOPRIL 40 MG PO TABS: 40.0000 mg | ORAL_TABLET | Freq: Two times a day (BID) | ORAL | Status: DC

## 2012-06-12 MED ORDER — FUROSEMIDE 40 MG PO TABS: 40.0000 mg | ORAL_TABLET | Freq: Two times a day (BID) | ORAL | Status: DC

## 2012-06-12 NOTE — Telephone Encounter (Signed)
Pt stated she will be out of the follow meds before monday bp meds lisinaprel xantax  nexium Fluid pill   Sent to triage pt stated she is having problems with legs

## 2012-06-12 NOTE — Telephone Encounter (Signed)
Pt called r/s her follow up from 2/24 and wanted to get labs prior

## 2012-06-12 NOTE — Telephone Encounter (Signed)
Appointment 06/19/12 pt aware

## 2012-06-12 NOTE — Telephone Encounter (Signed)
Med filled per [place].

## 2012-06-12 NOTE — Telephone Encounter (Signed)
Pt last seen on 9/24 ok to fill?

## 2012-06-12 NOTE — Telephone Encounter (Signed)
Attempted contact with patient. Voice mail is full; sent text message to cell phone with office number for patient to return call.  Unable to leave voice mail on home phone.

## 2012-06-12 NOTE — Telephone Encounter (Signed)
Ok to refill all meds  

## 2012-06-19 ENCOUNTER — Ambulatory Visit: Payer: 59 | Admitting: Internal Medicine

## 2012-06-19 ENCOUNTER — Telehealth: Payer: Self-pay | Admitting: Internal Medicine

## 2012-06-19 NOTE — Telephone Encounter (Signed)
Meds were filled on 06/12/12. Per pharmacy pt has been notified.

## 2012-06-19 NOTE — Telephone Encounter (Signed)
Pt needs refill on all her meds [facility]  Pt stated that she very dizzy yesterday.  She pt she has had vertigo before and that's what it felt like  Pt has appointment 06/26/12

## 2012-06-27 ENCOUNTER — Ambulatory Visit: Payer: 59 | Admitting: Internal Medicine

## 2012-06-29 ENCOUNTER — Other Ambulatory Visit: Payer: Self-pay | Admitting: Internal Medicine

## 2012-07-11 ENCOUNTER — Ambulatory Visit: Payer: 59 | Admitting: Internal Medicine

## 2012-07-14 ENCOUNTER — Other Ambulatory Visit (INDEPENDENT_AMBULATORY_CARE_PROVIDER_SITE_OTHER): Payer: 59

## 2012-07-14 ENCOUNTER — Ambulatory Visit: Payer: 59 | Admitting: Internal Medicine

## 2012-07-14 ENCOUNTER — Ambulatory Visit: Payer: 59

## 2012-07-14 ENCOUNTER — Telehealth: Payer: Self-pay | Admitting: *Deleted

## 2012-07-14 DIAGNOSIS — D631 Anemia in chronic kidney disease: Secondary | ICD-10-CM

## 2012-07-14 DIAGNOSIS — E119 Type 2 diabetes mellitus without complications: Secondary | ICD-10-CM

## 2012-07-14 DIAGNOSIS — E039 Hypothyroidism, unspecified: Secondary | ICD-10-CM

## 2012-07-14 DIAGNOSIS — E785 Hyperlipidemia, unspecified: Secondary | ICD-10-CM

## 2012-07-14 DIAGNOSIS — N189 Chronic kidney disease, unspecified: Secondary | ICD-10-CM

## 2012-07-14 DIAGNOSIS — N039 Chronic nephritic syndrome with unspecified morphologic changes: Secondary | ICD-10-CM

## 2012-07-14 DIAGNOSIS — E1129 Type 2 diabetes mellitus with other diabetic kidney complication: Secondary | ICD-10-CM

## 2012-07-14 LAB — CBC WITH DIFFERENTIAL/PLATELET
HCT: 31.7 % — ABNORMAL LOW (ref 36.0–46.0)
Hemoglobin: 10.8 g/dL — ABNORMAL LOW (ref 12.0–15.0)
Lymphocytes Relative: 52 % — ABNORMAL HIGH (ref 12–46)
MCHC: 34.1 g/dL (ref 30.0–36.0)
Monocytes Absolute: 0.4 10*3/uL (ref 0.1–1.0)
Monocytes Relative: 6 % (ref 3–12)
Neutro Abs: 2.1 10*3/uL (ref 1.7–7.7)
WBC: 5.9 10*3/uL (ref 4.0–10.5)

## 2012-07-14 LAB — COMPREHENSIVE METABOLIC PANEL
BUN: 23 mg/dL (ref 6–23)
CO2: 28 mEq/L (ref 19–32)
GFR: 42.32 mL/min — ABNORMAL LOW (ref >60.00)
Glucose, Bld: 162 mg/dL — ABNORMAL HIGH (ref 70–99)
Sodium: 138 mEq/L (ref 135–145)
Total Bilirubin: 0.5 mg/dL (ref 0.3–1.2)
Total Protein: 7.2 g/dL (ref 6.0–8.3)

## 2012-07-14 LAB — LIPID PANEL: HDL: 41.5 mg/dL (ref >39.00)

## 2012-07-14 LAB — TSH: TSH: 6.05 u[IU]/mL — ABNORMAL HIGH (ref 0.35–5.50)

## 2012-07-14 LAB — HEMOGLOBIN A1C: Hgb A1c MFr Bld: 5 % (ref 4.6–6.5)

## 2012-07-14 NOTE — Telephone Encounter (Signed)
What labs and dx would like for this pt?

## 2012-07-14 NOTE — Telephone Encounter (Signed)
orders are in thanks

## 2012-07-18 ENCOUNTER — Encounter: Payer: Self-pay | Admitting: Internal Medicine

## 2012-07-18 MED ORDER — LEVOTHYROXINE SODIUM 25 MCG PO TABS: 25.0000 ug | ORAL_TABLET | Freq: Every day | ORAL | Status: DC

## 2012-07-18 NOTE — Addendum Note (Signed)
Addended by: Sherlene Shams on: 07/18/2012 10:23 AM   Modules accepted: Orders

## 2012-07-24 ENCOUNTER — Ambulatory Visit (INDEPENDENT_AMBULATORY_CARE_PROVIDER_SITE_OTHER): Payer: 59 | Admitting: Internal Medicine

## 2012-07-24 ENCOUNTER — Encounter: Payer: Self-pay | Admitting: Internal Medicine

## 2012-07-24 VITALS — BP 130/64 | HR 80 | Temp 97.9°F | Resp 18 | Wt 197.5 lb

## 2012-07-24 DIAGNOSIS — I872 Venous insufficiency (chronic) (peripheral): Secondary | ICD-10-CM

## 2012-07-24 DIAGNOSIS — K644 Residual hemorrhoidal skin tags: Secondary | ICD-10-CM

## 2012-07-24 DIAGNOSIS — Z23 Encounter for immunization: Secondary | ICD-10-CM

## 2012-07-24 DIAGNOSIS — E039 Hypothyroidism, unspecified: Secondary | ICD-10-CM | POA: Insufficient documentation

## 2012-07-24 DIAGNOSIS — R4189 Other symptoms and signs involving cognitive functions and awareness: Secondary | ICD-10-CM

## 2012-07-24 DIAGNOSIS — E1122 Type 2 diabetes mellitus with diabetic chronic kidney disease: Secondary | ICD-10-CM

## 2012-07-24 DIAGNOSIS — E1129 Type 2 diabetes mellitus with other diabetic kidney complication: Secondary | ICD-10-CM

## 2012-07-24 DIAGNOSIS — N189 Chronic kidney disease, unspecified: Secondary | ICD-10-CM
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
## 2012-07-24 NOTE — Assessment & Plan Note (Addendum)
Well-controlled on current medications.  hemoglobin A1c is 5.0 on metformin alone. Sheh is up to date on eye exams and her foot exam is normal. She is on the appropriate medications.

## 2012-07-24 NOTE — Assessment & Plan Note (Addendum)
She has brawny skin changes and is not wearing compression stockings.  She was advised to purchase some and wear daily  ,

## 2012-07-24 NOTE — Patient Instructions (Signed)
You received the influenza vaccine today   Please return in 6 weeks to repeat the thyroid test   Please start wearing the compression knee high stockings  Daily to manage your venous insufficiency  I am referring you to Dr. Lemar Livings to evaluate your hemorrhoids
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
## 2012-07-24 NOTE — Assessment & Plan Note (Signed)
TSH 6.5,  patient reluctant to start medication and repeat a TSH in 6 weeks

## 2012-07-27 ENCOUNTER — Other Ambulatory Visit: Payer: Self-pay | Admitting: Adult Health

## 2012-07-27 ENCOUNTER — Ambulatory Visit (INDEPENDENT_AMBULATORY_CARE_PROVIDER_SITE_OTHER): Payer: 59 | Admitting: Adult Health

## 2012-07-27 ENCOUNTER — Encounter: Payer: Self-pay | Admitting: Adult Health

## 2012-07-27 VITALS — BP 152/71 | HR 76 | Temp 98.9°F | Resp 16 | Wt 196.0 lb

## 2012-07-27 DIAGNOSIS — R6889 Other general symptoms and signs: Secondary | ICD-10-CM

## 2012-07-27 DIAGNOSIS — J101 Influenza due to other identified influenza virus with other respiratory manifestations: Secondary | ICD-10-CM

## 2012-07-27 DIAGNOSIS — J111 Influenza due to unidentified influenza virus with other respiratory manifestations: Secondary | ICD-10-CM

## 2012-07-27 LAB — BASIC METABOLIC PANEL
BUN: 22 mg/dL (ref 6–23)
CO2: 29 mEq/L (ref 19–32)
GFR: 42 mL/min — ABNORMAL LOW (ref >60.00)
Glucose, Bld: 152 mg/dL — ABNORMAL HIGH (ref 70–99)
Potassium: 3.7 mEq/L (ref 3.5–5.1)
Sodium: 135 mEq/L (ref 135–145)

## 2012-07-27 MED ORDER — OSELTAMIVIR PHOSPHATE 75 MG PO CAPS: 75.0000 mg | ORAL_CAPSULE | Freq: Two times a day (BID) | ORAL | Status: DC

## 2012-07-27 NOTE — Progress Notes (Signed)
  Subjective:    Patient ID: [name], female    DOB: [birth year], 77 y.o.   MRN: [id number]  HPI  Patient is a 77 year old female who presents to clinic with complaints of fever, chills, malaise and cough. She reports that she got the flu shot on Monday. Her symptoms began yesterday. She denies shortness of breath or chest pain, nausea vomiting or diarrhea. She has not taken any OTC products.   Current Outpatient Prescriptions on File Prior to Visit  Medication Sig Dispense Refill  . ALPRAZolam (XANAX) 0.5 MG tablet TAKE ONE TABLET TWICE DAILY AS NEEDED   FOR ANXIETY  60 tablet  1  . atenolol (TENORMIN) 100 MG tablet TAKE 1 TABLET BY MOUTH TWICE A DAY.  60 tablet  3  . cloNIDine (CATAPRES) 0.2 MG tablet Take 1 tablet (0.2 mg total) by mouth 2 (two) times daily.  60 tablet  6  . CRESTOR 20 MG tablet TAKE ONE (1) TABLET EACH DAY  30 tablet  11  . esomeprazole (NEXIUM) 40 MG capsule Take 1 capsule (40 mg total) by mouth daily before breakfast.  30 capsule  5  . febuxostat (ULORIC) 40 MG tablet Take 1 tablet (40 mg total) by mouth daily.  30 tablet  5  . furosemide (LASIX) 40 MG tablet Take 1 tablet (40 mg total) by mouth 2 (two) times daily.  60 tablet  3  . lisinopril (PRINIVIL,ZESTRIL) 40 MG tablet Take 1 tablet (40 mg total) by mouth 2 (two) times daily.  60 tablet  5  . metFORMIN (GLUCOPHAGE) 500 MG tablet TAKE ONE TABLET TWICE DAILY WITH A MEAL  60 tablet  5  . spironolactone (ALDACTONE) 50 MG tablet Take 1 tablet (50 mg total) by mouth daily.  30 tablet  5  . levothyroxine (LEVOTHROID) 25 MCG tablet Take 1 tablet (25 mcg total) by mouth daily.  90 tablet  1  . valsartan (DIOVAN) 320 MG tablet Take 1 tablet (320 mg total) by mouth daily.  30 tablet  5   No current facility-administered medications on file prior to visit.     Review of Systems  Constitutional: Positive for fever, chills and fatigue.  HENT: Positive for rhinorrhea. Negative for sore throat.   Respiratory: Positive  for cough. Negative for shortness of breath and wheezing.   Cardiovascular: Negative for chest pain.  Gastrointestinal: Negative for nausea, vomiting, abdominal pain and diarrhea.  Neurological: Negative for dizziness, light-headedness and headaches.   BP 152/71  Pulse 76  Temp(Src) 98.9 F (37.2 C) (Oral)  Resp 16  Wt 196 lb (88.905 kg)  BMI 33.92 kg/m2  SpO2 98%     Objective:   Physical Exam  Constitutional: She is oriented to person, place, and time.  Acutely ill appearing  HENT:  Head: Normocephalic and atraumatic.  Right Ear: External ear normal.  Left Ear: External ear normal.  Pharyngeal erythema.  Cardiovascular: Normal rate and regular rhythm.   Murmur heard.  Systolic murmur is present  Pulmonary/Chest: Effort normal and breath sounds normal. No respiratory distress. She has no wheezes. She has no rales.  Abdominal: Soft.  Lymphadenopathy:    She has cervical adenopathy.  Neurological: She is alert and oriented to person, place, and time.  Skin: Skin is warm and dry.  Psychiatric: She has a normal mood and affect. Her behavior is normal. Judgment and thought content normal.       Assessment & Plan:

## 2012-07-27 NOTE — Assessment & Plan Note (Signed)
Nasal swab + for influenza A. Start tamiflu bid x 5 days. Patient lives alone so no need for prophylactic treatment of family members. I am checking a metabolic panel to assess for dehydration.

## 2012-07-27 NOTE — Patient Instructions (Signed)
  Please have labs drawn prior to leaving the office.  You tested positive for the flu.  I am prescribing Tamiflu twice a day for 5 days.  Please drink fluids to stay hydrated.  If your symptoms worsen, you need to contact us immediately or call [phone] if symptoms severe.

## 2012-08-01 ENCOUNTER — Emergency Department: Payer: Self-pay | Admitting: Emergency Medicine

## 2012-08-01 LAB — COMPREHENSIVE METABOLIC PANEL
Anion Gap: 3 — ABNORMAL LOW (ref 7–16)
Bilirubin,Total: 0.2 mg/dL (ref 0.2–1.0)
Calcium, Total: 9.5 mg/dL (ref 8.5–10.1)
Chloride: 105 mmol/L (ref 98–107)
Creatinine: 1.47 mg/dL — ABNORMAL HIGH (ref 0.60–1.30)
EGFR (Non-African Amer.): 35 — ABNORMAL LOW
Potassium: 3.7 mmol/L (ref 3.5–5.1)
SGOT(AST): 23 U/L (ref 15–37)
SGPT (ALT): 25 U/L (ref 12–78)
Sodium: 140 mmol/L (ref 136–145)

## 2012-08-01 LAB — URINALYSIS, COMPLETE
Bacteria: NONE SEEN
Glucose,UR: NEGATIVE mg/dL (ref 0–75)
Leukocyte Esterase: NEGATIVE
Nitrite: NEGATIVE
Ph: 6 (ref 4.5–8.0)
Protein: 30
Specific Gravity: 1.003 (ref 1.003–1.030)
WBC UR: <1 /HPF (ref 0–5)

## 2012-08-01 LAB — TROPONIN I: Troponin-I: <0.02 ng/mL

## 2012-08-01 LAB — CBC
HCT: 32.2 % — ABNORMAL LOW (ref 35.0–47.0)
HGB: 11.1 g/dL — ABNORMAL LOW (ref 12.0–16.0)
MCH: 31.2 pg (ref 26.0–34.0)
MCV: 91 fL (ref 80–100)
WBC: 4 10*3/uL (ref 3.6–11.0)

## 2012-08-04 ENCOUNTER — Other Ambulatory Visit: Payer: Self-pay | Admitting: Internal Medicine

## 2012-08-10 ENCOUNTER — Ambulatory Visit: Payer: Self-pay | Admitting: General Surgery

## 2012-08-18 ENCOUNTER — Other Ambulatory Visit: Payer: Self-pay | Admitting: Internal Medicine

## 2012-08-29 ENCOUNTER — Other Ambulatory Visit: Payer: 59

## 2012-09-07 ENCOUNTER — Ambulatory Visit: Payer: Self-pay | Admitting: General Surgery

## 2012-09-19 ENCOUNTER — Telehealth: Payer: Self-pay | Admitting: Internal Medicine

## 2012-09-19 NOTE — Telephone Encounter (Signed)
Left message for patient to return call.

## 2012-09-19 NOTE — Telephone Encounter (Signed)
According to them, she "no showed",  And did not call ,  So did she reschedule with them?

## 2012-09-19 NOTE — Telephone Encounter (Signed)
See below.  Please find out why patient no showed.

## 2012-09-19 NOTE — Telephone Encounter (Signed)
Message copied by Sherlene Shams on Tue Sep 19, 2012  1:07 PM ------      Message from: [facility], [place] M      Created: Tue Sep 19, 2012 11:25 AM      Regarding: no show       Hello      We wanted to let you know that the patient was a no show for Dr Lemar Livings regarding bleeding hemorrhoids.             Thanks      Dorathy Daft RN ------

## 2012-09-19 NOTE — Telephone Encounter (Signed)
Patient called she stated that she called Dr. Rutherford Nail office and cancelled her appointment ,because she had to go to a funeral.

## 2012-09-21 ENCOUNTER — Encounter: Payer: Self-pay | Admitting: *Deleted

## 2012-09-23 ENCOUNTER — Other Ambulatory Visit: Payer: Self-pay | Admitting: Internal Medicine

## 2012-09-24 NOTE — Telephone Encounter (Signed)
Needs appt

## 2012-10-04 ENCOUNTER — Ambulatory Visit (INDEPENDENT_AMBULATORY_CARE_PROVIDER_SITE_OTHER): Payer: 59 | Admitting: Internal Medicine

## 2012-10-04 ENCOUNTER — Encounter: Payer: Self-pay | Admitting: Internal Medicine

## 2012-10-04 VITALS — BP 142/58 | HR 90 | Temp 98.7°F | Resp 16 | Wt 194.0 lb

## 2012-10-04 DIAGNOSIS — N189 Chronic kidney disease, unspecified: Secondary | ICD-10-CM

## 2012-10-04 DIAGNOSIS — E039 Hypothyroidism, unspecified: Secondary | ICD-10-CM

## 2012-10-04 DIAGNOSIS — E785 Hyperlipidemia, unspecified: Secondary | ICD-10-CM

## 2012-10-04 DIAGNOSIS — N182 Chronic kidney disease, stage 2 (mild): Secondary | ICD-10-CM

## 2012-10-04 DIAGNOSIS — D631 Anemia in chronic kidney disease: Secondary | ICD-10-CM

## 2012-10-04 DIAGNOSIS — E1129 Type 2 diabetes mellitus with other diabetic kidney complication: Secondary | ICD-10-CM

## 2012-10-04 DIAGNOSIS — K625 Hemorrhage of anus and rectum: Secondary | ICD-10-CM

## 2012-10-04 DIAGNOSIS — I10 Essential (primary) hypertension: Secondary | ICD-10-CM

## 2012-10-04 DIAGNOSIS — E1122 Type 2 diabetes mellitus with diabetic chronic kidney disease: Secondary | ICD-10-CM

## 2012-10-04 DIAGNOSIS — I872 Venous insufficiency (chronic) (peripheral): Secondary | ICD-10-CM

## 2012-10-04 DIAGNOSIS — L732 Hidradenitis suppurativa: Secondary | ICD-10-CM

## 2012-10-04 NOTE — Progress Notes (Signed)
Patient ID: [name], female   DOB: Sep 23, 1935, 77 y.o.   MRN: [id number]  Patient Active Problem List   Diagnosis Date Noted  . Hidradenitis suppurativa 10/05/2012  . Influenza A 07/27/2012  . Unspecified hypothyroidism 07/24/2012  . Cognitive changes 01/18/2012  . Back pain, thoracic 11/27/2011  . Rectal bleeding 11/24/2011  . Venous insufficiency of leg 11/24/2011  . Diabetes mellitus with chronic kidney disease 10/03/2011  . Anemia of chronic renal failure 10/03/2011  . Constipation 09/29/2011  . Chest pain with normal coronary angiography 04/21/2011  . Screening for colon cancer 04/12/2011  . Screening for breast cancer 04/12/2011  . Hypertension 04/12/2011  . Hyperlipidemia LDL goal < 100 04/12/2011  . Screening for osteoporosis 04/12/2011    Subjective:  CC:   Chief Complaint  Patient presents with  . Follow-up    concerned about 2 tic bites.    HPI:   [name] a 77 y.o. female who presents for evaluation of acute and chronic problems.  She has had recent development of vaginal boils and 2 recent tick bites on back. Treated at urgent Care last week ,  Blood tests were drawn,  presmably for RMSF, but records are not available and patient is not sure. Two tick bites were evaluated ,  Patient had removed the tick prior to evaluation .  Was trreated empirically wth doxycycline which she is tolerating well.  The boilds on her vagina are sore and not imporving.  She has had them before.    Chronic VI with history of ulcers.  Brawny skin changes noted.  Not wearing her stockings regularly.    Past Medical History  Diagnosis Date  . Diabetes mellitus   . GERD (gastroesophageal reflux disease)   . Hypertension     Past Surgical History  Procedure Laterality Date  . Cholecystectomy    . Abdominal hysterectomy    . Back surgery    . Arthroplasty w/ arthroscopy medial / lateral compartment knee  Feb 2011    [name]   The following portions of the patient's  history were reviewed and updated as appropriate: Allergies, current medications, and problem list.    Review of Systems:   Patient denies headache, fevers, malaise, unintentional weight loss, skin rash, eye pain, sinus congestion and sinus pain, sore throat, dysphagia,  hemoptysis , cough, dyspnea, wheezing, chest pain, palpitations, orthopnea, edema, abdominal pain, nausea, melena, diarrhea, constipation, flank pain, dysuria, hematuria, urinary  Frequency, nocturia, numbness, tingling, seizures,  Focal weakness, Loss of consciousness,  Tremor, insomnia, depression, anxiety, and suicidal ideation.     History   Social History  . Marital Status: Single    Spouse Name: N/A    Number of Children: N/A  . Years of Education: N/A   Occupational History  . Not on file.   Social History Main Topics  . Smoking status: Former Smoker -- 30 years    Types: Cigarettes    Quit date: 09/28/2001  . Smokeless tobacco: Current User    Types: Snuff  . Alcohol Use: No  . Drug Use: No  . Sexually Active: Not on file   Other Topics Concern  . Not on file   Social History Narrative  . No narrative on file    Objective:  BP 142/58  Pulse 90  Temp(Src) 98.7 F (37.1 C) (Oral)  Resp 16  Wt 194 lb (87.998 kg)  BMI 33.57 kg/m2  SpO2 99%  General appearance: alert, cooperative and appears stated age Ears: normal TM's  and external ear canals both ears Throat: lips, mucosa, and tongue normal; teeth and gums normal Neck: no adenopathy, no carotid bruit, supple, symmetrical, trachea midline and thyroid not enlarged, symmetric, no tenderness/mass/nodules Back: symmetric, no curvature. ROM normal. No CVA tenderness. Lungs: clear to auscultation bilaterally Heart: regular rate and rhythm, S1, S2 normal, no murmur, click, rub or gallop Abdomen: soft, non-tender; bowel sounds normal; no masses,  no organomegaly Pulses: 2+ and symmetric Skin: brawny skin changes to mid tibia bilaterally.   Nonpitting edema bilaterally. No rashes or lesions Lymph nodes: Cervical, supraclavicular, and axillary nodes normal. Pelvic area:  2 small nonerythematous subcutaneous vaginal cysts, nonfluctuant and nontender.    Assessment and Plan:  Hidradenitis suppurativa Involving the perineal area. She has no evidence of erythema or fluctuant boil is on exam. There is no indication for incision and drainage. She is currently on doxycycline. Reassurance provided. Patient reminded to use antibacterial soap such as dial or Lever 2000.  Venous insufficiency of leg She has chronic venous insufficiency stasis dermatitis due to noncompliance with compression stockings. Management and diagnosis of her condition was discussed again at length with  patient and she was reminded that there are no alternatives to daily use of compression stockings other than surgery.  Rectal bleeding  Secondary to hemorrhage. She has a very large erythematous external hemorrhoid on exam. This has been recurrently bleeding. Referral to Dr. Doristine Counter for surgery.  Diabetes mellitus with chronic kidney disease Well-controlled on current medications.  hemoglobin A1c has been consistently less than 7.0 . She is up-to-date on eye exams and her foot exam is norma. l we'll repeat his urine microalbumin to creatinine ratio at next visit. She is on the appropriate medications.  Anemia of chronic renal failure Stable, hemoglobin at 10.873 months ago with no recent change.    Updated Medication List Outpatient Encounter Prescriptions as of 10/04/2012  Medication Sig Dispense Refill  . ALPRAZolam (XANAX) 0.5 MG tablet TAKE ONE TABLET TWICE DAILY AS NEEDED FOR ANXIETY  60 tablet  0  . atenolol (TENORMIN) 100 MG tablet TAKE 1 TABLET BY MOUTH TWICE A DAY.  60 tablet  3  . atenolol (TENORMIN) 100 MG tablet TAKE ONE TABLET TWICE DAILY  60 tablet  5  . cloNIDine (CATAPRES) 0.2 MG tablet Take 1 tablet (0.2 mg total) by mouth 2 (two) times daily.   60 tablet  6  . CRESTOR 20 MG tablet TAKE ONE (1) TABLET EACH DAY  30 tablet  11  . doxycycline (ADOXA) 100 MG tablet Take 100 mg by mouth 2 (two) times daily.      Marland Kitchen esomeprazole (NEXIUM) 40 MG capsule Take 1 capsule (40 mg total) by mouth daily before breakfast.  30 capsule  5  . furosemide (LASIX) 40 MG tablet Take 1 tablet (40 mg total) by mouth 2 (two) times daily.  60 tablet  3  . lisinopril (PRINIVIL,ZESTRIL) 40 MG tablet Take 1 tablet (40 mg total) by mouth 2 (two) times daily.  60 tablet  5  . metFORMIN (GLUCOPHAGE) 500 MG tablet TAKE ONE TABLET TWICE DAILY WITH A MEAL  60 tablet  5  . spironolactone (ALDACTONE) 50 MG tablet Take 1 tablet (50 mg total) by mouth daily.  30 tablet  5  . ULORIC 40 MG tablet TAKE ONE (1) TABLET EACH DAY  30 tablet  11  . valsartan (DIOVAN) 320 MG tablet Take 1 tablet (320 mg total) by mouth daily.  30 tablet  5  . levothyroxine (  LEVOTHROID) 25 MCG tablet Take 1 tablet (25 mcg total) by mouth daily.  90 tablet  1  . oseltamivir (TAMIFLU) 75 MG capsule Take 1 capsule (75 mg total) by mouth 2 (two) times daily.  10 capsule  0   No facility-administered encounter medications on file as of 10/04/2012.     Orders Placed This Encounter  Procedures  . Lipid panel  . Hemoglobin A1c  . Comprehensive metabolic panel  . TSH    Return in about 4 weeks (around 11/01/2012).

## 2012-10-04 NOTE — Patient Instructions (Addendum)
The "boils" on your vagina are improving with the doxycycline.  They do not need to be lanced or drained.  If they ever become painful or more swollen,  Apply warm  washcloths for 15 minutes .  DO NOT PICK.  Call or make appt for antibiotic.  I am sending you back to Dr. Lemar Livings to have your hemorrhoid looked at.  It is very large  You need to wear your compression stockings more consistently.  Apply in the mornign while legs are at their smallest diameter  Return next week for FASTING labs

## 2012-10-05 DIAGNOSIS — L732 Hidradenitis suppurativa: Secondary | ICD-10-CM | POA: Insufficient documentation

## 2012-10-05 NOTE — Assessment & Plan Note (Signed)
Well-controlled on current medications.  hemoglobin A1c has been consistently less than 7.0 .She is up-to-date on eye exams and her foot exam is normal. we'll repeat his urine microalbumin to creatinine ratio at next visit. She is on the appropriate medications. 

## 2012-10-05 NOTE — Assessment & Plan Note (Signed)
She has chronic venous insufficiency stasis dermatitis due to noncompliance with compression stockings. Management and diagnosis of her condition was discussed again at length with  patient and she was reminded that there are no alternatives to daily use of compression stockings other than surgery.

## 2012-10-05 NOTE — Assessment & Plan Note (Addendum)
Stable, hemoglobin at 10.873 months ago with no recent change.

## 2012-10-05 NOTE — Assessment & Plan Note (Signed)
Secondary to hemorrhage. She has a very large erythematous external hemorrhoid on exam. This has been recurrently bleeding. Referral to Dr. Doristine Counter for surgery.

## 2012-10-05 NOTE — Assessment & Plan Note (Signed)
Involving the perineal area. She has no evidence of erythema or fluctuant boil is on exam. There is no indication for incision and drainage. She is currently on doxycycline. Reassurance provided. Patient reminded to use antibacterial soap such as dial or Lever 2000.

## 2012-10-10 ENCOUNTER — Other Ambulatory Visit (INDEPENDENT_AMBULATORY_CARE_PROVIDER_SITE_OTHER): Payer: 59

## 2012-10-10 DIAGNOSIS — E785 Hyperlipidemia, unspecified: Secondary | ICD-10-CM

## 2012-10-10 DIAGNOSIS — E1129 Type 2 diabetes mellitus with other diabetic kidney complication: Secondary | ICD-10-CM

## 2012-10-10 DIAGNOSIS — N189 Chronic kidney disease, unspecified: Secondary | ICD-10-CM

## 2012-10-10 DIAGNOSIS — E1122 Type 2 diabetes mellitus with diabetic chronic kidney disease: Secondary | ICD-10-CM

## 2012-10-10 DIAGNOSIS — E039 Hypothyroidism, unspecified: Secondary | ICD-10-CM

## 2012-10-10 DIAGNOSIS — I10 Essential (primary) hypertension: Secondary | ICD-10-CM

## 2012-10-11 LAB — COMPREHENSIVE METABOLIC PANEL
AST: 19 U/L (ref 0–37)
BUN: 15 mg/dL (ref 6–23)
CO2: 26 mEq/L (ref 19–32)
Calcium: 9.9 mg/dL (ref 8.4–10.5)
Chloride: 105 mEq/L (ref 96–112)
Creatinine, Ser: 1.3 mg/dL — ABNORMAL HIGH (ref 0.4–1.2)
GFR: 49.66 mL/min — ABNORMAL LOW (ref >60.00)

## 2012-10-11 LAB — LIPID PANEL
HDL: 45.8 mg/dL (ref >39.00)
Total CHOL/HDL Ratio: 3
Triglycerides: 105 mg/dL (ref 0.0–149.0)
VLDL: 21 mg/dL (ref 0.0–40.0)

## 2012-10-17 ENCOUNTER — Other Ambulatory Visit: Payer: Self-pay | Admitting: Internal Medicine

## 2012-10-19 ENCOUNTER — Other Ambulatory Visit: Payer: Self-pay | Admitting: Internal Medicine

## 2012-11-02 ENCOUNTER — Ambulatory Visit: Payer: 59 | Admitting: Internal Medicine

## 2012-11-06 ENCOUNTER — Ambulatory Visit (INDEPENDENT_AMBULATORY_CARE_PROVIDER_SITE_OTHER): Payer: PRIVATE HEALTH INSURANCE | Admitting: General Surgery

## 2012-11-06 ENCOUNTER — Encounter: Payer: Self-pay | Admitting: General Surgery

## 2012-11-06 VITALS — BP 140/76 | HR 78 | Resp 13 | Ht 64.0 in | Wt 193.0 lb

## 2012-11-06 DIAGNOSIS — K625 Hemorrhage of anus and rectum: Secondary | ICD-10-CM

## 2012-11-06 DIAGNOSIS — K649 Unspecified hemorrhoids: Secondary | ICD-10-CM | POA: Insufficient documentation

## 2012-11-06 MED ORDER — POLYETHYLENE GLYCOL 3350 17 GM/SCOOP PO POWD: ORAL | Status: DC

## 2012-11-06 NOTE — Patient Instructions (Addendum)
Hemorrhoid Banding Hemorrhoids are veins in the anus and lower rectum that become enlarged. The most common symptoms are rectal bleeding, itching, and sometimes pain. Hemorrhoids might come out with straining or having a bowel movement, and they can sometimes be pushed back in. There are internal and external hemorrhoids. Only internal hemorrhoids can be treated with banding. In this procedure, a rubber band is placed near the hemorrhoid tissue, cutting off the blood supply. This procedure prevents the hemorrhoids from slipping down. LET YOUR CAREGIVER KNOW ABOUT: All medicines you are taking, especially blood thinners such as aspirin and coumadin.  RISKS AND COMPLICATIONS This is not a painful procedure, but if you do have intense pain immediately let your surgeon know because the band may need to be removed. You may have some mild pain or discomfort in the first 2 days or so after treatment. Sometimes there may be delayed bleeding in the first week after treatment.  BEFORE THE PROCEDURE  There is no special preparation needed before banding. Your surgeon may have you do an enema prior to the procedure. You will go home the same day.  HOME CARE INSTRUCTIONS   Your surgeon might instruct you to do sitz baths as needed if you have discomfort or after a bowel movement.  You may be instructed to use fiber supplements. SEEK MEDICAL CARE IF:  You have an increase in pain.  Your pain does not get better. SEEK IMMEDIATE MEDICAL CARE IF:  You have intense pain.  Fever greater than 100.5 F (38.1 C).  Bleeding that does not stop, or pus from the anus. Document Released: 02/07/2009 Document Revised: 07/05/2011 Document Reviewed: 02/07/2009 [facility] Patient Information 2014 [place], [place]. Colonoscopy A colonoscopy is an exam to evaluate your entire colon. In this exam, your colon is cleansed. A long fiberoptic tube is inserted through your rectum and into your colon. The fiberoptic scope  (endoscope) is a long bundle of enclosed and very flexible fibers. These fibers transmit light to the area examined and send images from that area to your caregiver. Discomfort is usually minimal. You may be given a drug to help you sleep (sedative) during or prior to the procedure. This exam helps to detect lumps (tumors), polyps, inflammation, and areas of bleeding. Your caregiver may also take a small piece of tissue (biopsy) that will be examined under a microscope. LET YOUR CAREGIVER KNOW ABOUT:   Allergies to food or medicine.  Medicines taken, including vitamins, herbs, eyedrops, over-the-counter medicines, and creams.  Use of steroids (by mouth or creams).  Previous problems with anesthetics or numbing medicines.  History of bleeding problems or blood clots.  Previous surgery.  Other health problems, including diabetes and kidney problems.  Possibility of pregnancy, if this applies. BEFORE THE PROCEDURE   A clear liquid diet may be required for 2 days before the exam.  Ask your caregiver about changing or stopping your regular medications.  Liquid injections (enemas) or laxatives may be required.  A large amount of electrolyte solution may be given to you to drink over a short period of time. This solution is used to clean out your colon.  You should be present 60 minutes prior to your procedure or as directed by your caregiver. AFTER THE PROCEDURE   If you received a sedative or pain relieving medication, you will need to arrange for someone to drive you home.  Occasionally, there is a little blood passed with the first bowel movement. Do not be concerned. FINDING OUT THE RESULTS  OF YOUR TEST Not all test results are available during your visit. If your test results are not back during the visit, make an appointment with your caregiver to find out the results. Do not assume everything is normal if you have not heard from your caregiver or the medical facility. It is  important for you to follow up on all of your test results. HOME CARE INSTRUCTIONS   It is not unusual to pass moderate amounts of gas and experience mild abdominal cramping following the procedure. This is due to air being used to inflate your colon during the exam. Walking or a warm pack on your belly (abdomen) may help.  You may resume all normal meals and activities after sedatives and medicines have worn off.  Only take over-the-counter or prescription medicines for pain, discomfort, or fever as directed by your caregiver. Do not use aspirin or blood thinners if a biopsy was taken. Consult your caregiver for medicine usage if biopsies were taken. SEEK IMMEDIATE MEDICAL CARE IF:   You have a fever.  You pass large blood clots or fill a toilet with blood following the procedure. This may also occur 10 to 14 days following the procedure. This is more likely if a biopsy was taken.  You develop abdominal pain that keeps getting worse and cannot be relieved with medicine. Document Released: 04/09/2000 Document Revised: 07/05/2011 Document Reviewed: 11/23/2007 [facility] Patient Information 2014 [place], [place].  Patient has been scheduled for a colonoscopy and hemorrhoid banding on 12-19-12 at [facility]. This patient has been asked to hold metformin day of colonoscopy prep and procedure.

## 2012-11-06 NOTE — Progress Notes (Signed)
Patient ID: [name], female   DOB: 1935/11/15, 77 y.o.   MRN: [id number]  Chief Complaint  Patient presents with  . Rectal Bleeding    hemorrhoids    HPI [name] is a 77 y.o. female  Here due to hemorrhoids and rectal bleeding. She has had this problem for the past four years. This has gotten worse in the last two weeks. She reports bright red bleeding with straining to use the bathroom. The patient is 2008 colonoscopy completed by Dr. Servando Snare showed only internal hemorrhoids. HPI  Past Medical History  Diagnosis Date  . Diabetes mellitus   . GERD (gastroesophageal reflux disease)   . Hypertension   . Anemia   . Arthritis   . Hemorrhoids     Past Surgical History  Procedure Laterality Date  . Cholecystectomy    . Abdominal hysterectomy    . Back surgery    . Arthroplasty w/ arthroscopy medial / lateral compartment knee  Feb 2011    [name]    Family History  Problem Relation Age of Onset  . Heart disease Mother   . Hypertension Mother   . Cancer Mother     Social History History  Substance Use Topics  . Smoking status: Former Smoker -- 30 years    Types: Cigarettes    Quit date: 09/28/2001  . Smokeless tobacco: Current User    Types: Snuff  . Alcohol Use: No    Allergies  Allergen Reactions  . Morphine And Related Other (See Comments)    Makes patient feel crazy    Current Outpatient Prescriptions  Medication Sig Dispense Refill  . ALPRAZolam (XANAX) 0.5 MG tablet TAKE ONE TABLET TWICE DAILY AS NEEDED FOR ANXIETY  60 tablet  0  . atenolol (TENORMIN) 100 MG tablet TAKE 1 TABLET BY MOUTH TWICE A DAY.  60 tablet  3  . cloNIDine (CATAPRES) 0.2 MG tablet Take 1 tablet (0.2 mg total) by mouth 2 (two) times daily.  60 tablet  6  . CRESTOR 20 MG tablet TAKE ONE (1) TABLET EACH DAY  30 tablet  11  . furosemide (LASIX) 40 MG tablet Take 1 tablet (40 mg total) by mouth 2 (two) times daily.  60 tablet  3  . metFORMIN (GLUCOPHAGE) 500 MG tablet TAKE ONE TABLET  TWICE DAILY WITH A MEAL  60 tablet  5  . NEXIUM 40 MG capsule TAKE ONE CAPSULE DAILY BEFORE BREAKFAST  30 capsule  5  . ULORIC 40 MG tablet TAKE ONE (1) TABLET EACH DAY  30 tablet  11  . valsartan (DIOVAN) 320 MG tablet Take 1 tablet (320 mg total) by mouth daily.  30 tablet  5  . doxycycline (ADOXA) 100 MG tablet Take 100 mg by mouth 2 (two) times daily.      Marland Kitchen lisinopril (PRINIVIL,ZESTRIL) 40 MG tablet TAKE ONE TABLET TWICE DAILY  60 tablet  5  . polyethylene glycol powder (GLYCOLAX/MIRALAX) powder 255 grams one bottle for colonoscopy prep  255 g  0  . spironolactone (ALDACTONE) 50 MG tablet TAKE ONE (1) TABLET EVERY MORNING  30 tablet  5   No current facility-administered medications for this visit.    Review of Systems Review of Systems  Constitutional: Negative.   Respiratory: Negative.   Cardiovascular: Negative.     Blood pressure 140/76, pulse 78, resp. rate 13, height 5\' 4"  (1.626 m), weight 193 lb (87.544 kg).  Physical Exam Physical Exam  Constitutional: She is oriented to person, place, and time.  She appears well-developed and well-nourished.  Cardiovascular: Normal rate.   Murmur heard.  Systolic murmur is present with a grade of 2/6  Pulmonary/Chest: Effort normal and breath sounds normal.  Genitourinary: Rectal exam shows internal hemorrhoid. Guaiac negative stool.  Neurological: She is alert and oriented to person, place, and time.    Data Reviewed None.  Assessment    Rectal bleeding, internal hemorrhoids.     Plan    Screening colonoscopy we completed to be sure secondary source of bleeding is not identified. Hemorrhoid banding will be completed at the time of her colonoscopy.     Patient has been scheduled for a colonoscopy and hemorrhoid banding (completed in Endoscopy) on 12-19-12 at [facility]. This patient has been asked to hold metformin day of colonoscopy prep and procedure.   [name] 11/07/2012, 9:16 PM

## 2012-11-07 ENCOUNTER — Other Ambulatory Visit: Payer: Self-pay | Admitting: Internal Medicine

## 2012-11-07 ENCOUNTER — Encounter: Payer: Self-pay | Admitting: General Surgery

## 2012-11-07 ENCOUNTER — Other Ambulatory Visit: Payer: Self-pay | Admitting: General Surgery

## 2012-11-07 DIAGNOSIS — K649 Unspecified hemorrhoids: Secondary | ICD-10-CM

## 2012-11-07 DIAGNOSIS — K625 Hemorrhage of anus and rectum: Secondary | ICD-10-CM

## 2012-11-07 NOTE — Telephone Encounter (Signed)
Last visit 6/14 

## 2012-11-08 NOTE — Telephone Encounter (Signed)
Rx faxed to pharmacy  

## 2012-12-15 ENCOUNTER — Telehealth: Payer: Self-pay | Admitting: *Deleted

## 2012-12-15 NOTE — Telephone Encounter (Signed)
Patient reports she is no longer on doxycycline. Medication list has been updated accordingly. We will proceed with colonoscopy and hemorrhoid banding that is scheduled at [facility] for 12-19-12. Patient instructed to call the office if she has further questions.

## 2012-12-19 ENCOUNTER — Ambulatory Visit: Payer: Self-pay | Admitting: General Surgery

## 2012-12-19 DIAGNOSIS — K649 Unspecified hemorrhoids: Secondary | ICD-10-CM

## 2012-12-19 HISTORY — PX: HEMORRHOID SURGERY: SHX153

## 2012-12-19 LAB — HM COLONOSCOPY: HM Colonoscopy: NORMAL

## 2012-12-20 ENCOUNTER — Telehealth: Payer: Self-pay | Admitting: *Deleted

## 2012-12-20 NOTE — Telephone Encounter (Signed)
Phone call from patient (she spoke to [place]) stating she had surgery hemorrhoidectomy yesterday and that she was having pain.  She doesn't normally take anything "strong".  States she doesn't remember getting any paper work from hospital on postop care.  She will try tylenol for pain and call back tomorrow with status update.

## 2012-12-22 ENCOUNTER — Encounter: Payer: Self-pay | Admitting: General Surgery

## 2012-12-28 ENCOUNTER — Encounter: Payer: Self-pay | Admitting: Internal Medicine

## 2013-01-03 ENCOUNTER — Ambulatory Visit (INDEPENDENT_AMBULATORY_CARE_PROVIDER_SITE_OTHER): Payer: PRIVATE HEALTH INSURANCE | Admitting: General Surgery

## 2013-01-03 ENCOUNTER — Encounter: Payer: Self-pay | Admitting: General Surgery

## 2013-01-03 VITALS — BP 142/64 | HR 76 | Resp 14 | Ht 64.0 in | Wt 191.0 lb

## 2013-01-03 DIAGNOSIS — K649 Unspecified hemorrhoids: Secondary | ICD-10-CM

## 2013-01-03 NOTE — Patient Instructions (Signed)
Patient to return as needed. 

## 2013-01-03 NOTE — Progress Notes (Signed)
Patient ID: [name], female   DOB: [birth year], 77 y.o.   MRN: [id number]  Chief Complaint  Patient presents with  . Routine Post Op    hemorrhoidectomy     HPI [name] is a 77 y.o. female here today for her post op hemorrhoidectomy done 12/19/12.Patient states she is doing well , no bleeding just little gassy. HPI  Past Medical History  Diagnosis Date  . Diabetes mellitus   . GERD (gastroesophageal reflux disease)   . Hypertension   . Anemia   . Arthritis   . Hemorrhoids     Past Surgical History  Procedure Laterality Date  . Cholecystectomy    . Abdominal hysterectomy    . Back surgery    . Arthroplasty w/ arthroscopy medial / lateral compartment knee  Feb 2011    [name]  . Hemorrhoid surgery  12/19/12    Family History  Problem Relation Age of Onset  . Heart disease Mother   . Hypertension Mother   . Cancer Mother     Social History History  Substance Use Topics  . Smoking status: Former Smoker -- 30 years    Types: Cigarettes    Quit date: 09/28/2001  . Smokeless tobacco: Current User    Types: Snuff  . Alcohol Use: No    Allergies  Allergen Reactions  . Morphine And Related Other (See Comments)    Makes patient feel crazy    Current Outpatient Prescriptions  Medication Sig Dispense Refill  . ALPRAZolam (XANAX) 0.5 MG tablet TAKE ONE TABLET TWICE DAILY AS NEEDED FOR ANXIETY  60 tablet  3  . atenolol (TENORMIN) 100 MG tablet TAKE 1 TABLET BY MOUTH TWICE A DAY.  60 tablet  3  . cloNIDine (CATAPRES) 0.2 MG tablet Take 1 tablet (0.2 mg total) by mouth 2 (two) times daily.  60 tablet  6  . CRESTOR 20 MG tablet TAKE ONE (1) TABLET EACH DAY  30 tablet  11  . doxycycline (ADOXA) 100 MG tablet Take 100 mg by mouth 2 (two) times daily.      . furosemide (LASIX) 40 MG tablet Take 1 tablet (40 mg total) by mouth 2 (two) times daily.  60 tablet  3  . lisinopril (PRINIVIL,ZESTRIL) 40 MG tablet TAKE ONE TABLET TWICE DAILY  60 tablet  5  . metFORMIN  (GLUCOPHAGE) 500 MG tablet TAKE ONE TABLET TWICE DAILY WITH A MEAL  60 tablet  5  . NEXIUM 40 MG capsule TAKE ONE CAPSULE DAILY BEFORE BREAKFAST  30 capsule  5  . polyethylene glycol powder (GLYCOLAX/MIRALAX) powder 255 grams one bottle for colonoscopy prep  255 g  0  . spironolactone (ALDACTONE) 50 MG tablet TAKE ONE (1) TABLET EVERY MORNING  30 tablet  5  . ULORIC 40 MG tablet TAKE ONE (1) TABLET EACH DAY  30 tablet  11  . valsartan (DIOVAN) 320 MG tablet Take 1 tablet (320 mg total) by mouth daily.  30 tablet  5   No current facility-administered medications for this visit.    Review of Systems Review of Systems  Constitutional: Negative.   Respiratory: Negative.   Cardiovascular: Negative.     Blood pressure 142/64, pulse 76, resp. rate 14, height 5\' 4"  (1.626 m), weight 191 lb (86.637 kg).  Physical Exam Physical Exam  Constitutional: She is oriented to person, place, and time. She appears well-developed and well-nourished.  Neurological: She is alert and oriented to person, place, and time.  Skin: Skin is warm and  dry.  External anal exam shows no prolapsing tissue with vigorous straining. Digital rectal exam showed no palpable masses. It appears that the bandage hemorrhoids have sloughed as expected.  Data Reviewed Colonoscopy was unremarkable.  Assessment    Doing well status post banding for internal hemorrhoids. No other source for GI bleeding identified.    Plan    The patient will notify the office if she has any problems. Follow up otherwise will be on an as needed basis.       [name] 01/05/2013, 5:18 PM

## 2013-01-05 ENCOUNTER — Encounter: Payer: Self-pay | Admitting: General Surgery

## 2013-01-08 ENCOUNTER — Encounter: Payer: Self-pay | Admitting: *Deleted

## 2013-01-09 ENCOUNTER — Ambulatory Visit (INDEPENDENT_AMBULATORY_CARE_PROVIDER_SITE_OTHER): Payer: 59 | Admitting: Internal Medicine

## 2013-01-09 ENCOUNTER — Encounter: Payer: Self-pay | Admitting: Internal Medicine

## 2013-01-09 VITALS — BP 168/62 | HR 65 | Temp 98.2°F | Resp 14 | Wt 191.5 lb

## 2013-01-09 DIAGNOSIS — I10 Essential (primary) hypertension: Secondary | ICD-10-CM

## 2013-01-09 DIAGNOSIS — E785 Hyperlipidemia, unspecified: Secondary | ICD-10-CM

## 2013-01-09 DIAGNOSIS — E1129 Type 2 diabetes mellitus with other diabetic kidney complication: Secondary | ICD-10-CM

## 2013-01-09 DIAGNOSIS — D631 Anemia in chronic kidney disease: Secondary | ICD-10-CM

## 2013-01-09 DIAGNOSIS — H539 Unspecified visual disturbance: Secondary | ICD-10-CM

## 2013-01-09 DIAGNOSIS — E1122 Type 2 diabetes mellitus with diabetic chronic kidney disease: Secondary | ICD-10-CM

## 2013-01-09 DIAGNOSIS — N189 Chronic kidney disease, unspecified: Secondary | ICD-10-CM

## 2013-01-09 DIAGNOSIS — Z131 Encounter for screening for diabetes mellitus: Secondary | ICD-10-CM

## 2013-01-09 LAB — CBC WITH DIFFERENTIAL/PLATELET
Basophils Relative: 0.7 % (ref 0.0–3.0)
Eosinophils Relative: 1.1 % (ref 0.0–5.0)
Hemoglobin: 11.4 g/dL — ABNORMAL LOW (ref 12.0–15.0)
Lymphocytes Relative: 35.6 % (ref 12.0–46.0)
Monocytes Relative: 7.5 % (ref 3.0–12.0)
Neutro Abs: 2.5 10*3/uL (ref 1.4–7.7)
Neutrophils Relative %: 55.1 % (ref 43.0–77.0)
RBC: 3.69 Mil/uL — ABNORMAL LOW (ref 3.87–5.11)
WBC: 4.6 10*3/uL (ref 4.5–10.5)

## 2013-01-09 LAB — COMPREHENSIVE METABOLIC PANEL
AST: 19 U/L (ref 0–37)
Albumin: 3.9 g/dL (ref 3.5–5.2)
BUN: 13 mg/dL (ref 6–23)
CO2: 32 mEq/L (ref 19–32)
Calcium: 9.9 mg/dL (ref 8.4–10.5)
Chloride: 98 mEq/L (ref 96–112)
Creatinine, Ser: 1.5 mg/dL — ABNORMAL HIGH (ref 0.4–1.2)
GFR: 45.3 mL/min — ABNORMAL LOW (ref >60.00)
Glucose, Bld: 110 mg/dL — ABNORMAL HIGH (ref 70–99)
Potassium: 3.6 mEq/L (ref 3.5–5.1)

## 2013-01-09 LAB — MICROALBUMIN / CREATININE URINE RATIO: Microalb, Ur: 7.3 mg/dL — ABNORMAL HIGH (ref 0.0–1.9)

## 2013-01-09 NOTE — Assessment & Plan Note (Addendum)
Elevated today.  Reviewed list of meds, patient is not taking OTC meds that could be causing,. It.  Have asked patient to recheck bp at home a minimum of 5 times over the next 4 weeks and call readings to office for adjustment of medications.   

## 2013-01-09 NOTE — Progress Notes (Signed)
Patient ID: [name], female   DOB: 1935-12-03, 77 y.o.   MRN: [id number]   Patient Active Problem List   Diagnosis Date Noted  . Hemorrhoids 11/06/2012  . Hidradenitis suppurativa 10/05/2012  . Influenza A 07/27/2012  . Unspecified hypothyroidism 07/24/2012  . Cognitive changes 01/18/2012  . Back pain, thoracic 11/27/2011  . Venous insufficiency of leg 11/24/2011  . Diabetes mellitus with chronic kidney disease 10/03/2011  . Anemia of chronic renal failure 10/03/2011  . Constipation 09/29/2011  . Chest pain with normal coronary angiography 04/21/2011  . Screening for colon cancer 04/12/2011  . Screening for breast cancer 04/12/2011  . Hypertension 04/12/2011  . Hyperlipidemia LDL goal < 100 04/12/2011  . Screening for osteoporosis 04/12/2011    Subjective:  CC:   Chief Complaint  Patient presents with  . Follow-up    surgery patient having blurred vision.    HPI:   [name] a 77 y.o. female who presents Follow up on chronic conditions, including DM Type 2 , hypertension and hyperlipidemia.  .  She is s/p hemorrhoidectomy.  Symptoms have improved   1)  DM her cbg today is 179 4 hrs post prandially.    She is not checking her blood sugar daily. She has been having blurred vision which started this morning. occurrs intermittenlty when she feels bad.   Last eye appt was sept 2013.  Sees Dr Alvester Morin  Annually.  2) Right flank soreness for the past two weeks  Aggravated by sleeping on her side.    Past Medical History  Diagnosis Date  . Diabetes mellitus   . GERD (gastroesophageal reflux disease)   . Hypertension   . Anemia   . Arthritis   . Hemorrhoids     Past Surgical History  Procedure Laterality Date  . Cholecystectomy    . Abdominal hysterectomy    . Back surgery    . Arthroplasty w/ arthroscopy medial / lateral compartment knee  Feb 2011    [name]  . Hemorrhoid surgery  12/19/12       The following portions of the patient's history were  reviewed and updated as appropriate: Allergies, current medications, and problem list.    Review of Systems:   12 Pt  review of systems was negative except those addressed in the HPI,     History   Social History  . Marital Status: Single    Spouse Name: N/A    Number of Children: N/A  . Years of Education: N/A   Occupational History  . Not on file.   Social History Main Topics  . Smoking status: Former Smoker -- 30 years    Types: Cigarettes    Quit date: 09/28/2001  . Smokeless tobacco: Current User    Types: Snuff  . Alcohol Use: No  . Drug Use: No  . Sexual Activity: Not Currently   Other Topics Concern  . Not on file   Social History Narrative  . No narrative on file    Objective:  Filed Vitals:   01/09/13 1137  BP: 168/62  Pulse: 65  Temp: 98.2 F (36.8 C)  Resp: 14     General appearance: alert, cooperative and appears stated age Ears: normal TM's and external ear canals both ears Throat: lips, mucosa, and tongue normal; teeth and gums normal Neck: no adenopathy, no carotid bruit, supple, symmetrical, trachea midline and thyroid not enlarged, symmetric, no tenderness/mass/nodules Back: symmetric, no curvature. ROM normal. No CVA tenderness. Lungs: clear to auscultation bilaterally Heart:  regular rate and rhythm, S1, S2 normal, no murmur, click, rub or gallop Abdomen: soft, non-tender; bowel sounds normal; no masses,  no organomegaly Pulses: 2+ and symmetric Skin: Skin color, texture, turgor normal. No rashes or lesions Lymph nodes: Cervical, supraclavicular, and axillary nodes normal.  Assessment and Plan:  Hypertension Elevated today.  Reviewed list of meds, patient is not taking OTC meds that could be causing,. It.  Have asked patient to recheck bp at home a minimum of 5 times over the next 4 weeks and call readings to office for adjustment of medications.     Diabetes mellitus with chronic kidney disease Well-controlled on current  medications.  hemoglobin A1c has been consistently less than 8.0 . She is in need of her annual eye exam and her foot exam is normal. She has microalbuminuria but her cr is stable.  She is on the appropriate medications. Referral to [facility] for annula eye exam .   Hyperlipidemia LDL goal < 100 LDL and triglycerides are at goal on current medications. She has no side effects and liver enzymes are normal. No changes today    Updated Medication List Outpatient Encounter Prescriptions as of 01/09/2013  Medication Sig Dispense Refill  . ALPRAZolam (XANAX) 0.5 MG tablet TAKE ONE TABLET TWICE DAILY AS NEEDED FOR ANXIETY  60 tablet  3  . atenolol (TENORMIN) 100 MG tablet TAKE 1 TABLET BY MOUTH TWICE A DAY.  60 tablet  3  . cloNIDine (CATAPRES) 0.2 MG tablet Take 0.2 mg by mouth as needed.      . CRESTOR 20 MG tablet TAKE ONE (1) TABLET EACH DAY  30 tablet  11  . furosemide (LASIX) 40 MG tablet Take 1 tablet (40 mg total) by mouth 2 (two) times daily.  60 tablet  3  . lisinopril (PRINIVIL,ZESTRIL) 40 MG tablet TAKE ONE TABLET TWICE DAILY  60 tablet  5  . metFORMIN (GLUCOPHAGE) 500 MG tablet TAKE ONE TABLET TWICE DAILY WITH A MEAL  60 tablet  5  . NEXIUM 40 MG capsule TAKE ONE CAPSULE DAILY BEFORE BREAKFAST  30 capsule  5  . spironolactone (ALDACTONE) 50 MG tablet TAKE ONE (1) TABLET EVERY MORNING  30 tablet  5  . ULORIC 40 MG tablet TAKE ONE (1) TABLET EACH DAY  30 tablet  11  . valsartan (DIOVAN) 320 MG tablet Take 1 tablet (320 mg total) by mouth daily.  30 tablet  5  . [DISCONTINUED] cloNIDine (CATAPRES) 0.2 MG tablet Take 1 tablet (0.2 mg total) by mouth 2 (two) times daily.  60 tablet  6  . doxycycline (ADOXA) 100 MG tablet Take 100 mg by mouth 2 (two) times daily.      . polyethylene glycol powder (GLYCOLAX/MIRALAX) powder 255 grams one bottle for colonoscopy prep  255 g  0   No facility-administered encounter medications on file as of 01/09/2013.     Orders Placed This Encounter   Procedures  . Hemoglobin A1c  . CBC with Differential  . Comprehensive metabolic panel  . Microalbumin / creatinine urine ratio  . Comprehensive metabolic panel  . Ambulatory referral to Ophthalmology  . HM DIABETES EYE EXAM  . HM DIABETES FOOT EXAM    Return in about 3 months (around 04/10/2013).

## 2013-01-09 NOTE — Patient Instructions (Addendum)
Please check your blood pressure at home several times over the next week and call the results to our office  You may add 1/2 tablet of clonidine if you have taken all of your medications and your  blood pressure is still > 150   I am referring you to [facility] for your eye exam  Please return in 3 months

## 2013-01-10 ENCOUNTER — Encounter: Payer: Self-pay | Admitting: Internal Medicine

## 2013-01-10 NOTE — Assessment & Plan Note (Signed)
LDL and triglycerides are at goal on current medications. She has no side effects and liver enzymes are normal. No changes today.  

## 2013-01-10 NOTE — Assessment & Plan Note (Addendum)
Well-controlled on current medications.  hemoglobin A1c has been consistently less than 8.0 . She is in need of her annual eye exam and her foot exam is normal. She has microalbuminuria but her cr is stable.  She is on the appropriate medications. Referral to [facility] for annula eye exam .

## 2013-01-19 ENCOUNTER — Other Ambulatory Visit: Payer: Self-pay | Admitting: Internal Medicine

## 2013-01-19 ENCOUNTER — Telehealth: Payer: Self-pay | Admitting: Internal Medicine

## 2013-01-19 LAB — HM DIABETES EYE EXAM

## 2013-01-19 NOTE — Telephone Encounter (Signed)
Medication verified

## 2013-01-19 NOTE — Telephone Encounter (Signed)
Escript received for pt clonidine/catapress.  Pharmacy states they have not filled these previously.  Wanted to make sure this is what was to be ordered.  States they have filled lisinopril/diovan more recently.  Please double check to see what needs to be ordered.

## 2013-01-19 NOTE — Telephone Encounter (Signed)
Eprescribed.

## 2013-01-25 ENCOUNTER — Encounter: Payer: Self-pay | Admitting: *Deleted

## 2013-01-26 ENCOUNTER — Encounter: Payer: Self-pay | Admitting: Internal Medicine

## 2013-01-27 ENCOUNTER — Emergency Department: Payer: Self-pay | Admitting: Emergency Medicine

## 2013-01-27 LAB — CBC
HCT: 36 % (ref 35.0–47.0)
HGB: 12.9 g/dL (ref 12.0–16.0)
MCH: 31.7 pg (ref 26.0–34.0)
Platelet: 233 10*3/uL (ref 150–440)
RDW: 13.1 % (ref 11.5–14.5)

## 2013-01-27 LAB — URINALYSIS, COMPLETE
Bilirubin,UR: NEGATIVE
Ketone: NEGATIVE
Nitrite: NEGATIVE
WBC UR: 3 /HPF (ref 0–5)

## 2013-01-27 LAB — COMPREHENSIVE METABOLIC PANEL
Albumin: 4.1 g/dL (ref 3.4–5.0)
Anion Gap: 6 — ABNORMAL LOW (ref 7–16)
BUN: 16 mg/dL (ref 7–18)
Bilirubin,Total: 0.3 mg/dL (ref 0.2–1.0)
Calcium, Total: 9.8 mg/dL (ref 8.5–10.1)
EGFR (Non-African Amer.): 35 — ABNORMAL LOW
Glucose: 134 mg/dL — ABNORMAL HIGH (ref 65–99)
Potassium: 3.7 mmol/L (ref 3.5–5.1)
SGPT (ALT): 23 U/L (ref 12–78)
Sodium: 136 mmol/L (ref 136–145)
Total Protein: 8.7 g/dL — ABNORMAL HIGH (ref 6.4–8.2)

## 2013-01-27 LAB — PROTIME-INR
INR: 1
Prothrombin Time: 13 secs (ref 11.5–14.7)

## 2013-01-27 LAB — CK TOTAL AND CKMB (NOT AT ARMC): CK, Total: 160 U/L (ref 21–215)

## 2013-02-02 ENCOUNTER — Emergency Department: Payer: Self-pay | Admitting: Emergency Medicine

## 2013-02-20 ENCOUNTER — Other Ambulatory Visit: Payer: Self-pay | Admitting: Internal Medicine

## 2013-03-20 ENCOUNTER — Other Ambulatory Visit: Payer: Self-pay | Admitting: Internal Medicine

## 2013-04-16 ENCOUNTER — Other Ambulatory Visit: Payer: Self-pay | Admitting: Internal Medicine

## 2013-04-30 ENCOUNTER — Other Ambulatory Visit: Payer: Self-pay | Admitting: Internal Medicine

## 2013-05-01 ENCOUNTER — Other Ambulatory Visit: Payer: Self-pay | Admitting: Internal Medicine

## 2013-05-01 NOTE — Telephone Encounter (Signed)
Ok to refill,  Authorized in epic 

## 2013-05-02 NOTE — Telephone Encounter (Signed)
Prescription called in to pharmacy

## 2013-05-11 ENCOUNTER — Telehealth: Payer: Self-pay | Admitting: Internal Medicine

## 2013-05-11 ENCOUNTER — Encounter: Payer: Self-pay | Admitting: Internal Medicine

## 2013-05-11 ENCOUNTER — Ambulatory Visit (INDEPENDENT_AMBULATORY_CARE_PROVIDER_SITE_OTHER): Payer: 59 | Admitting: Internal Medicine

## 2013-05-11 VITALS — BP 118/58 | HR 68 | Temp 97.6°F | Resp 16

## 2013-05-11 DIAGNOSIS — J069 Acute upper respiratory infection, unspecified: Secondary | ICD-10-CM

## 2013-05-11 DIAGNOSIS — Z139 Encounter for screening, unspecified: Secondary | ICD-10-CM

## 2013-05-11 LAB — POCT INFLUENZA A/B
Influenza A, POC: NEGATIVE
Influenza B, POC: NEGATIVE

## 2013-05-11 MED ORDER — LEVOFLOXACIN 500 MG PO TABS: 500.0000 mg | ORAL_TABLET | Freq: Every day | ORAL | Status: DC

## 2013-05-11 NOTE — Patient Instructions (Addendum)
You have a viral  Syndrome .  Our sinuses are congested.  post nasal drip can cause sore throat.  Your flu test was negative  Lavage your sinuses twice daily with Simply saline nasal spray.  Use benadryl 25 mg (dipehnhydramine)  every 8 hours for runny nose and Sudafed PE (Phenylephrine) 10 to 30 mg  every 8 hours to manage the sinus congestion.  Use Robitussin or Delsym as needed for cough   Gargle with salt water often for the sore throat.   If you develop T > 100.4,  Start coughing up yellow or green sputum,  Have Green/bloody  nasal discharge, ear  Or facial pain,  You can start the  Antibiotic (levaquin).  Please take a probiotic ( Align, Flora jen or MattelCulturelle) for 2 weeks if yo start the antibiotic to prevent a serious antibiotic associated diarrhea  Called clostirudium dificile colitis and a vaginal yeast infection

## 2013-05-11 NOTE — Progress Notes (Signed)
Pre-visit discussion using our clinic review tool. No additional management support is needed unless otherwise documented below in the visit note.  

## 2013-05-11 NOTE — Progress Notes (Signed)
Patient ID: [name], female   DOB: 04/07/1936, 78 y.o.   MRN: [id number]   Patient Active Problem List   Diagnosis Date Noted  . Viral URI 05/13/2013  . Hemorrhoids 11/06/2012  . Hidradenitis suppurativa 10/05/2012  . Unspecified hypothyroidism 07/24/2012  . Cognitive changes 01/18/2012  . Back pain, thoracic 11/27/2011  . Venous insufficiency of leg 11/24/2011  . Diabetes mellitus with chronic kidney disease 10/03/2011  . Anemia of chronic renal failure 10/03/2011  . Constipation 09/29/2011  . Chest pain with normal coronary angiography 04/21/2011  . Screening for colon cancer 04/12/2011  . Screening for breast cancer 04/12/2011  . Hypertension 04/12/2011  . Hyperlipidemia LDL goal < 100 04/12/2011  . Screening for osteoporosis 04/12/2011    Subjective:  CC:   Chief Complaint  Patient presents with  . Acute Visit    body aches ,     HPI:   [name] a 78 y.o. female who presents with Malaise of recent onset 2 days ago,  accompanied by sinus congestion,   increased postnasal drip without significant amount of coughing. She has had some body aches but cannot distinguish between bodyaches and arthritis. She denies fevers. She called 911 last night because she said she was having congestive heart failure. EMS evaluated her and did not take her to the ER. She was found to be hypertensive. She took her clonidine and this improved her blood pressure.   Past Medical History  Diagnosis Date  . Diabetes mellitus   . GERD (gastroesophageal reflux disease)   . Hypertension   . Anemia   . Arthritis   . Hemorrhoids     Past Surgical History  Procedure Laterality Date  . Cholecystectomy    . Abdominal hysterectomy    . Back surgery    . Arthroplasty w/ arthroscopy medial / lateral compartment knee  Feb 2011    [name]  . Hemorrhoid surgery  12/19/12       The following portions of the patient's history were reviewed and updated as appropriate: Allergies,  current medications, and problem list.    Review of Systems:   12 Pt  review of systems was negative except those addressed in the HPI,     History   Social History  . Marital Status: Single    Spouse Name: N/A    Number of Children: N/A  . Years of Education: N/A   Occupational History  . Not on file.   Social History Main Topics  . Smoking status: Former Smoker -- 30 years    Types: Cigarettes    Quit date: 09/28/2001  . Smokeless tobacco: Current User    Types: Snuff  . Alcohol Use: No  . Drug Use: No  . Sexual Activity: Not Currently   Other Topics Concern  . Not on file   Social History Narrative  . No narrative on file    Objective:  Filed Vitals:   05/11/13 1346  BP: 118/58  Pulse: 68  Temp: 97.6 F (36.4 C)  Resp: 16     General appearance: alert, cooperative and appears stated age Ears: normal TM's and external ear canals both ears Throat: lips, mucosa, and tongue normal; teeth and gums normal Neck: no adenopathy, no carotid bruit, supple, symmetrical, trachea midline and thyroid not enlarged, symmetric, no tenderness/mass/nodules Back: symmetric, no curvature. ROM normal. No CVA tenderness. Lungs: clear to auscultation bilaterally Heart: regular rate and rhythm, S1, S2 normal, no murmur, click, rub or gallop Abdomen: soft, non-tender; bowel  sounds normal; no masses,  no organomegaly Pulses: 2+ and symmetric Skin: Skin color, texture, turgor normal. No rashes or lesions Lymph nodes: Cervical, supraclavicular, and axillary nodes normal.  Assessment and Plan:  Viral URI Influenza test was negative.This URI is most likely viral given the recent onset of symptoms..  I have explained that in viral URIS, an antibiotic will not help the symptoms and will increase the risk of developing diarrhea.,  Continue oral and nasal decongestants,  Ibuprofen 400 mg and tylenol 650 mq 8 hrs for aches and pains, abx only if symptoms worsen to include fevers,  facial pain, purulent sputum./drainage.    Updated Medication List Outpatient Encounter Prescriptions as of 05/11/2013  Medication Sig  . ALPRAZolam (XANAX) 0.5 MG tablet TAKE ONE TABLET TWICE DAILY AS NEEDED FOR ANXIETY  . cloNIDine (CATAPRES) 0.2 MG tablet TAKE ONE TABLET EVERY 12 HOURS  . CRESTOR 20 MG tablet TAKE ONE (1) TABLET EACH DAY  . furosemide (LASIX) 40 MG tablet TAKE ONE TABLET BY MOUTH TWO TIMES DAILY  . lisinopril (PRINIVIL,ZESTRIL) 40 MG tablet TAKE ONE TABLET TWICE DAILY  . metFORMIN (GLUCOPHAGE) 500 MG tablet TAKE ONE TABLET TWICE DAILY WITH A MEAL  . NEXIUM 40 MG capsule TAKE ONE CAPSULE DAILY BEFORE BREAKFAST  . polyethylene glycol powder (GLYCOLAX/MIRALAX) powder 255 grams one bottle for colonoscopy prep  . spironolactone (ALDACTONE) 50 MG tablet TAKE ONE (1) TABLET EVERY MORNING  . ULORIC 40 MG tablet TAKE ONE (1) TABLET EACH DAY  . valsartan (DIOVAN) 320 MG tablet TAKE ONE (1) TABLET EACH DAY  . atenolol (TENORMIN) 100 MG tablet TAKE 1 TABLET BY MOUTH TWICE A DAY.  Marland Kitchen. doxycycline (ADOXA) 100 MG tablet Take 100 mg by mouth 2 (two) times daily.  Marland Kitchen. levofloxacin (LEVAQUIN) 500 MG tablet Take 1 tablet (500 mg total) by mouth daily.     Orders Placed This Encounter  Procedures  . POCT Influenza A/B    No Follow-up on file.

## 2013-05-11 NOTE — Telephone Encounter (Signed)
Patient put on schedule.

## 2013-05-11 NOTE — Telephone Encounter (Signed)
Pt asking for appt.  States she is hurting in her chest and feeling very sick.  Transferred to triage.  Pt called back and said she cannot hold on the line for the Triage nurse because she is sick.  Thinks it is pneumonia.  Refused triage again.  Asking for a call.

## 2013-05-13 ENCOUNTER — Encounter: Payer: Self-pay | Admitting: Internal Medicine

## 2013-05-13 DIAGNOSIS — J069 Acute upper respiratory infection, unspecified: Secondary | ICD-10-CM | POA: Insufficient documentation

## 2013-05-13 NOTE — Assessment & Plan Note (Signed)
Influenza test was negative.This URI is most likely viral given the recent onset of symptoms..  I have explained that in viral URIS, an antibiotic will not help the symptoms and will increase the risk of developing diarrhea.,  Continue oral and nasal decongestants,  Ibuprofen 400 mg and tylenol 650 mq 8 hrs for aches and pains, abx only if symptoms worsen to include fevers, facial pain, purulent sputum./drainage.

## 2013-05-15 ENCOUNTER — Telehealth: Payer: Self-pay | Admitting: Internal Medicine

## 2013-05-15 ENCOUNTER — Emergency Department: Payer: Self-pay | Admitting: Emergency Medicine

## 2013-05-15 ENCOUNTER — Other Ambulatory Visit: Payer: Self-pay | Admitting: Internal Medicine

## 2013-05-15 LAB — URINALYSIS, COMPLETE
BILIRUBIN, UR: NEGATIVE
GLUCOSE, UR: NEGATIVE mg/dL (ref 0–75)
KETONE: NEGATIVE
LEUKOCYTE ESTERASE: NEGATIVE
Nitrite: NEGATIVE
Ph: 5 (ref 4.5–8.0)
Protein: 100
RBC,UR: 1 /HPF (ref 0–5)
Specific Gravity: 1.006 (ref 1.003–1.030)
Squamous Epithelial: NONE SEEN
WBC UR: 5 /HPF (ref 0–5)

## 2013-05-15 LAB — CBC
HCT: 32.6 % — ABNORMAL LOW (ref 35.0–47.0)
HGB: 11.2 g/dL — ABNORMAL LOW (ref 12.0–16.0)
MCH: 31.1 pg (ref 26.0–34.0)
MCHC: 34.4 g/dL (ref 32.0–36.0)
MCV: 91 fL (ref 80–100)
PLATELETS: 246 10*3/uL (ref 150–440)
RBC: 3.6 10*6/uL — AB (ref 3.80–5.20)
RDW: 13 % (ref 11.5–14.5)
WBC: 4.3 10*3/uL (ref 3.6–11.0)

## 2013-05-15 LAB — COMPREHENSIVE METABOLIC PANEL
ALK PHOS: 122 U/L — AB
AST: 23 U/L (ref 15–37)
Albumin: 3.9 g/dL (ref 3.4–5.0)
Anion Gap: 6 — ABNORMAL LOW (ref 7–16)
BUN: 22 mg/dL — AB (ref 7–18)
Bilirubin,Total: 0.4 mg/dL (ref 0.2–1.0)
CALCIUM: 9.4 mg/dL (ref 8.5–10.1)
CHLORIDE: 100 mmol/L (ref 98–107)
CREATININE: 3.07 mg/dL — AB (ref 0.60–1.30)
Co2: 34 mmol/L — ABNORMAL HIGH (ref 21–32)
EGFR (African American): 16 — ABNORMAL LOW
EGFR (Non-African Amer.): 14 — ABNORMAL LOW
GLUCOSE: 95 mg/dL (ref 65–99)
Osmolality: 283 (ref 275–301)
POTASSIUM: 2.7 mmol/L — AB (ref 3.5–5.1)
SGPT (ALT): 24 U/L (ref 12–78)
Sodium: 140 mmol/L (ref 136–145)
TOTAL PROTEIN: 8.1 g/dL (ref 6.4–8.2)

## 2013-05-15 MED ORDER — LEVOFLOXACIN 500 MG PO TABS: 500.0000 mg | ORAL_TABLET | Freq: Every day | ORAL | Status: DC

## 2013-05-15 NOTE — Telephone Encounter (Signed)
Pt states she was here last week on Friday for appt.  States she is getting worse.  States her eyes, nose and right side is burning and sore.  States she is not really coughing, not vomiting.  Appetite decreased.  States she caught herself talking out of her head.  High fever last night.  Wants to be seen today.  Transferred to triage.

## 2013-05-15 NOTE — Telephone Encounter (Signed)
Patient temperature and body aches , Burning and chills  Nausea, vomiting  Patient crying and very up set no appointment available and with symptoms advised patient to go to ER, patient stated she will follow instructions. FYI

## 2013-05-15 NOTE — Telephone Encounter (Signed)
Daughter called back to get appt for pt.  States pt told her she was transferred to triage but no one ever came to the phone.  While on phone with pt's daughter, pt called daughter and told her triage did call her back and advised her to go to ER.  Daughter states she will be taking pt to ER now.

## 2013-05-15 NOTE — Telephone Encounter (Signed)
Pt states she was seen on Friday last week.  She is worsening.  Had a high fever last night, was burning up, but did not check her temperature.  States she caught herself talking out of her head.  Eyes, nose, and right side of her ribs are hurting and sore.  Wants to be seen today by Dr. Darrick Huntsmanullo.  Transferred to triage.

## 2013-05-17 ENCOUNTER — Telehealth: Payer: Self-pay | Admitting: Internal Medicine

## 2013-05-17 DIAGNOSIS — N184 Chronic kidney disease, stage 4 (severe): Secondary | ICD-10-CM

## 2013-05-17 DIAGNOSIS — E876 Hypokalemia: Secondary | ICD-10-CM

## 2013-05-17 DIAGNOSIS — N179 Acute kidney failure, unspecified: Secondary | ICD-10-CM

## 2013-05-17 LAB — URINE CULTURE

## 2013-05-17 NOTE — Telephone Encounter (Signed)
Advised patient daughter to have patient stop the lasix immediately, patient has an appointment with you 05/18/13 @ 9 am.

## 2013-05-17 NOTE — Telephone Encounter (Signed)
Script faxed on 05/16/13 as requested.

## 2013-05-17 NOTE — Telephone Encounter (Signed)
The patient's recent recent lab work done at the emergency room on January 20th was concerning for acute on chronic kidney failure and low potassium.  Was she offered admission?  What she told to stop her furosemide and take potassium supplements? If she did not stop her furosemide she needs to do this immediately and come in today or tomorrow  For repeat basic metabolic panel to make sure her kidney function and potassium have improved.

## 2013-05-18 ENCOUNTER — Ambulatory Visit (INDEPENDENT_AMBULATORY_CARE_PROVIDER_SITE_OTHER): Payer: 59 | Admitting: Internal Medicine

## 2013-05-18 ENCOUNTER — Encounter: Payer: Self-pay | Admitting: Internal Medicine

## 2013-05-18 VITALS — BP 158/60 | HR 68 | Temp 97.7°F | Resp 16 | Wt 183.5 lb

## 2013-05-18 DIAGNOSIS — D631 Anemia in chronic kidney disease: Secondary | ICD-10-CM

## 2013-05-18 DIAGNOSIS — E1122 Type 2 diabetes mellitus with diabetic chronic kidney disease: Secondary | ICD-10-CM

## 2013-05-18 DIAGNOSIS — N039 Chronic nephritic syndrome with unspecified morphologic changes: Secondary | ICD-10-CM

## 2013-05-18 DIAGNOSIS — N189 Chronic kidney disease, unspecified: Secondary | ICD-10-CM

## 2013-05-18 DIAGNOSIS — J111 Influenza due to unidentified influenza virus with other respiratory manifestations: Secondary | ICD-10-CM

## 2013-05-18 DIAGNOSIS — J101 Influenza due to other identified influenza virus with other respiratory manifestations: Secondary | ICD-10-CM

## 2013-05-18 DIAGNOSIS — E876 Hypokalemia: Secondary | ICD-10-CM

## 2013-05-18 DIAGNOSIS — E1129 Type 2 diabetes mellitus with other diabetic kidney complication: Secondary | ICD-10-CM

## 2013-05-18 DIAGNOSIS — N179 Acute kidney failure, unspecified: Secondary | ICD-10-CM

## 2013-05-18 DIAGNOSIS — E1165 Type 2 diabetes mellitus with hyperglycemia: Secondary | ICD-10-CM

## 2013-05-18 DIAGNOSIS — N184 Chronic kidney disease, stage 4 (severe): Secondary | ICD-10-CM

## 2013-05-18 LAB — CBC WITH DIFFERENTIAL/PLATELET
BASOS PCT: 0.4 % (ref 0.0–3.0)
Basophils Absolute: 0 10*3/uL (ref 0.0–0.1)
EOS PCT: 0.7 % (ref 0.0–5.0)
Eosinophils Absolute: 0 10*3/uL (ref 0.0–0.7)
HCT: 29.3 % — ABNORMAL LOW (ref 36.0–46.0)
Hemoglobin: 10.1 g/dL — ABNORMAL LOW (ref 12.0–15.0)
Lymphocytes Relative: 28.4 % (ref 12.0–46.0)
Lymphs Abs: 1 10*3/uL (ref 0.7–4.0)
MCHC: 34.6 g/dL (ref 30.0–36.0)
MCV: 91.7 fl (ref 78.0–100.0)
MONOS PCT: 6.8 % (ref 3.0–12.0)
Monocytes Absolute: 0.2 10*3/uL (ref 0.1–1.0)
Neutro Abs: 2.3 10*3/uL (ref 1.4–7.7)
Neutrophils Relative %: 63.7 % (ref 43.0–77.0)
PLATELETS: 240 10*3/uL (ref 150.0–400.0)
RBC: 3.19 Mil/uL — AB (ref 3.87–5.11)
RDW: 12.8 % (ref 11.5–14.6)
WBC: 3.6 10*3/uL — AB (ref 4.5–10.5)

## 2013-05-18 LAB — BASIC METABOLIC PANEL
BUN: 22 mg/dL (ref 6–23)
CALCIUM: 9.1 mg/dL (ref 8.4–10.5)
CO2: 29 mEq/L (ref 19–32)
CREATININE: 2.7 mg/dL — AB (ref 0.4–1.2)
Chloride: 100 mEq/L (ref 96–112)
Creatinine: 2.7 mg/dL — AB (ref <=1.1)
GFR: 21.9 mL/min — AB (ref >60.00)
Glucose, Bld: 109 mg/dL — ABNORMAL HIGH (ref 70–99)
Potassium: 3.1 mEq/L — ABNORMAL LOW (ref 3.5–5.1)
SODIUM: 138 meq/L (ref 135–145)

## 2013-05-18 LAB — HEMOGLOBIN A1C: HEMOGLOBIN A1C: 6.6 % — AB (ref 4.6–6.5)

## 2013-05-18 LAB — HM DIABETES FOOT EXAM: HM DIABETIC FOOT EXAM: NORMAL

## 2013-05-18 LAB — MAGNESIUM: Magnesium: 1.2 mg/dL — ABNORMAL LOW (ref 1.5–2.5)

## 2013-05-18 NOTE — Patient Instructions (Signed)
Your kidney function was way off when you were treated at the Emergency room  This may be due to dehydration or medications.  Stop the furosemide and the metformin  Until I tell you to resume them  You need to see your kidney specialist ASAP.  We will make your appt  Return in 2 weeks to talk about your depression

## 2013-05-18 NOTE — Progress Notes (Signed)
Pre-visit discussion using our clinic review tool. No additional management support is needed unless otherwise documented below in the visit note.  

## 2013-05-18 NOTE — Progress Notes (Addendum)
Patient ID: [name], female   DOB: 06-May-1935, 78 y.o.   MRN: [id number]  Patient Active Problem List   Diagnosis Date Noted  . Acute renal failure 05/17/2013  . Viral URI 05/13/2013  . Hemorrhoids 11/06/2012  . Hidradenitis suppurativa 10/05/2012  . Unspecified hypothyroidism 07/24/2012  . Cognitive changes 01/18/2012  . Back pain, thoracic 11/27/2011  . Venous insufficiency of leg 11/24/2011  . Diabetes mellitus with chronic kidney disease 10/03/2011  . Anemia of chronic renal failure 10/03/2011  . Chest pain with normal coronary angiography 04/21/2011  . Screening for colon cancer 04/12/2011  . Screening for breast cancer 04/12/2011  . Hypertension 04/12/2011  . Hyperlipidemia LDL goal < 100 04/12/2011  . Screening for osteoporosis 04/12/2011    Subjective:  CC:   Chief Complaint  Patient presents with  . Follow-up    labs, patient very upset crying and c/o no appetitie    HPI:   [name] Cheelyis a 78 y.o. female who presents for ER follow up for feeling bad. Patient continues to deny cough but states she has had a poor appetite has felt occasionally nauseated. She was taken to the ER recently had to calling EMS for feeling bad. She is a poor historian and cannot really describe what was going on other than to say.  "I Thought I was dying."  She is not sure if she has been hallucinating or just having very vivid dreams at night. She did not discuss these symptoms with the ER doctor. In the ER she was noted to be hypokalemic. She was given potassium tablets and a prescription for Levaquin to take for bronchitis. She was not told that her kidney function was off although her creatinine was over 3 which is quite a change for her. She is very distressed over this today since I call her when I saw these labs and told her stop her Lasix immediately returned today. She is tearful today and very anxious.    Past Medical History  Diagnosis Date  . Diabetes mellitus   . GERD  (gastroesophageal reflux disease)   . Hypertension   . Anemia   . Arthritis   . Hemorrhoids     Past Surgical History  Procedure Laterality Date  . Cholecystectomy    . Abdominal hysterectomy    . Back surgery    . Arthroplasty w/ arthroscopy medial / lateral compartment knee  Feb 2011    [name]  . Hemorrhoid surgery  12/19/12       The following portions of the patient's history were reviewed and updated as appropriate: Allergies, current medications, and problem list.    Review of Systems:   Patient denies headache, fevers, malaise, unintentional weight loss, skin rash, eye pain, sinus congestion and sinus pain, sore throat, dysphagia,  hemoptysis , cough, dyspnea, wheezing, chest pain, palpitations, orthopnea, edema, abdominal pain, nausea, melena, diarrhea, constipation, flank pain, dysuria, hematuria, urinary  Frequency, nocturia, numbness, tingling, seizures,  Focal weakness, Loss of consciousness,  Tremor, insomnia,  and suicidal ideation.     History   Social History  . Marital Status: Single    Spouse Name: N/A    Number of Children: N/A  . Years of Education: N/A   Occupational History  . Not on file.   Social History Main Topics  . Smoking status: Former Smoker -- 30 years    Types: Cigarettes    Quit date: 09/28/2001  . Smokeless tobacco: Current User    Types: Snuff  . Alcohol  Use: No  . Drug Use: No  . Sexual Activity: Not Currently   Other Topics Concern  . Not on file   Social History Narrative  . No narrative on file    Objective:  Filed Vitals:   05/18/13 0918  BP: 158/60  Pulse: 68  Temp: 97.7 F (36.5 C)  Resp: 16     General appearance: alert, cooperative and appears stated age Ears: normal TM's and external ear canals both ears Throat: lips, mucosa, and tongue normal; teeth and gums normal Neck: no adenopathy, no carotid bruit, supple, symmetrical, trachea midline and thyroid not enlarged, symmetric, no  tenderness/mass/nodules Back: symmetric, no curvature. ROM normal. No CVA tenderness. Lungs: clear to auscultation bilaterally Heart: regular rate and rhythm, S1, S2 normal, no murmur, click, rub or gallop Abdomen: soft, non-tender; bowel sounds normal; no masses,  no organomegaly Pulses: 2+ and symmetric Skin: Skin color, texture, turgor normal. No rashes or lesions Lymph nodes: Cervical, supraclavicular, and axillary nodes normal. Psych: Anxious and tearful, makes good eye contact.  Assessment and Plan:  Acute renal failure Creatinine was 3.07 on January 20 during ER evaluation for dehydration secondary nausea and vomiting. Her potassium was quite low as well at 2.7. Is not clear why they did not admit her. The etiology may have been overuse of Lasix since she attributed her shortness of breath to congestive heart failure. Lasix has been stopped. Is not taking Spiriva lactone either. Potassium is being replaced. Repeat basic metabolic panel today shows improvement in all parameters  but not back to baseline. I have not stopped her valsartan and she has a history of malignant hypertension and will likely end up in the ER with symptomatic hypertensive urgency if this is discontinued. We will continue to suspend the Lasix and continue potassium supplementation. Referral to nephrology has been initiated. She has not seen a nephrologist in over a year  Lab Results  Component Value Date   CREATININE 2.7* 05/18/2013   Lab Results  Component Value Date   NA 138 05/18/2013   K 3.1* 05/18/2013   CL 100 05/18/2013   CO2 29 05/18/2013    Diabetes mellitus with chronic kidney disease Well-controlled on current medications.  hemoglobin A1c has been consistently less than 8.0 . She is in need of her annual eye exam and her foot exam is normal. She has microalbuminuria but her cr has increased significantly due to overuse of Lasix..  She is on the appropriate medications. Referral to [facility] for annual  eye exam .   Lab Results  Component Value Date   HGBA1C 6.6* 05/18/2013     Hypokalemia Multifactorial, secondary to use as diuretics and concurrent hypomagnesemia. Continue oral supplementation the patient will need to go to have IV magnesium infusions to replace her magnesium level.  Hypomagnesemia Likely secondary to poor appetite and nausea and vomiting. Given her level of 1.2 she will need to have an IV magnesium infusion which we will arrange a short stay.  Anemia of chronic renal failure Her hemoglobin has dropped to 10 from 11.4 several months ago. She would follow up with nephrology for consideration of iron infusions.  A total of 40 minutes was spent with patient more than half of which was spent in counseling, reviewing records from other prviders and coordination of care.  Updated Medication List Outpatient Encounter Prescriptions as of 05/18/2013  Medication Sig  . ALPRAZolam (XANAX) 0.5 MG tablet TAKE ONE TABLET TWICE DAILY AS NEEDED FOR ANXIETY  . atenolol (  TENORMIN) 100 MG tablet TAKE 1 TABLET BY MOUTH TWICE A DAY.  . cloNIDine (CATAPRES) 0.2 MG tablet TAKE ONE TABLET EVERY 12 HOURS  . CRESTOR 20 MG tablet TAKE ONE (1) TABLET EACH DAY  . doxycycline (ADOXA) 100 MG tablet Take 100 mg by mouth 2 (two) times daily.  Marland Kitchen levofloxacin (LEVAQUIN) 500 MG tablet Take 1 tablet (500 mg total) by mouth daily.  Marland Kitchen lisinopril (PRINIVIL,ZESTRIL) 40 MG tablet TAKE ONE TABLET TWICE DAILY  . metFORMIN (GLUCOPHAGE) 500 MG tablet TAKE ONE TABLET TWICE DAILY WITH A MEAL  . NEXIUM 40 MG capsule TAKE ONE CAPSULE DAILY BEFORE BREAKFAST  . polyethylene glycol powder (GLYCOLAX/MIRALAX) powder 255 grams one bottle for colonoscopy prep  . valsartan (DIOVAN) 320 MG tablet TAKE ONE (1) TABLET EACH DAY  . [DISCONTINUED] ULORIC 40 MG tablet TAKE ONE (1) TABLET EACH DAY  . [DISCONTINUED] furosemide (LASIX) 40 MG tablet TAKE ONE TABLET BY MOUTH TWO TIMES DAILY  . [DISCONTINUED] spironolactone  (ALDACTONE) 50 MG tablet TAKE ONE (1) TABLET EVERY MORNING

## 2013-05-20 ENCOUNTER — Encounter: Payer: Self-pay | Admitting: Internal Medicine

## 2013-05-20 DIAGNOSIS — E876 Hypokalemia: Secondary | ICD-10-CM | POA: Insufficient documentation

## 2013-05-20 MED ORDER — MAGNESIUM OXIDE 400 MG PO TABS: 400.0000 mg | ORAL_TABLET | Freq: Two times a day (BID) | ORAL | Status: DC

## 2013-05-20 NOTE — Assessment & Plan Note (Signed)
Multifactorial, secondary to use as diuretics and concurrent hypomagnesemia. Continue oral supplementation the patient will need to go to have IV magnesium infusions to replace her magnesium level.

## 2013-05-20 NOTE — Assessment & Plan Note (Signed)
Well-controlled on current medications.  hemoglobin A1c has been consistently less than 8.0 . She is in need of her annual eye exam and her foot exam is normal. She has microalbuminuria but her cr has increased significantly due to overuse of Lasix..  She is on the appropriate medications. Referral to [facility] for annual eye exam .   Lab Results  Component Value Date   HGBA1C 6.6* 05/18/2013

## 2013-05-20 NOTE — Addendum Note (Signed)
Addended by: Sherlene ShamsULLO, Jakaden Ouzts L on: 05/20/2013 03:13 PM   Modules accepted: Orders, Medications

## 2013-05-20 NOTE — Assessment & Plan Note (Signed)
Her hemoglobin has dropped to 10 from 11.4 several months ago. She would follow up with nephrology for consideration of iron infusions.

## 2013-05-20 NOTE — Assessment & Plan Note (Signed)
Likely secondary to poor appetite and nausea and vomiting. Given her level of 1.2 she will need to have an IV magnesium infusion which we will arrange a short stay.

## 2013-05-20 NOTE — Assessment & Plan Note (Addendum)
Creatinine was 3.07 on January 20 during ER evaluation for dehydration secondary nausea and vomiting. Her potassium was quite low as well at 2.7. Is not clear why they did not admit her. The etiology may have been overuse of Lasix since she attributed her shortness of breath to congestive heart failure. Lasix has been stopped. Is not taking Spiriva lactone either. Potassium is being replaced. Repeat basic metabolic panel today shows improvement in all parameters  but not back to baseline. I have not stopped her valsartan and she has a history of malignant hypertension and will likely end up in the ER with symptomatic hypertensive urgency if this is discontinued. We will continue to suspend the Lasix and continue potassium supplementation. Referral to nephrology has been initiated. She has not seen a nephrologist in over a year  Lab Results  Component Value Date   CREATININE 2.7* 05/18/2013   Lab Results  Component Value Date   NA 138 05/18/2013   K 3.1* 05/18/2013   CL 100 05/18/2013   CO2 29 05/18/2013

## 2013-05-21 LAB — PTH, INTACT AND CALCIUM
Calcium: 9.3 mg/dL (ref 8.4–10.5)
PTH: 241.8 pg/mL — ABNORMAL HIGH (ref 14.0–72.0)

## 2013-05-23 ENCOUNTER — Telehealth: Payer: Self-pay | Admitting: Internal Medicine

## 2013-05-23 ENCOUNTER — Telehealth: Payer: Self-pay

## 2013-05-23 NOTE — Telephone Encounter (Signed)
Asking if something can be called in for the fluid/swelling in her legs.  States she has been wearing stockings and is afraid to take them off.  She is afraid it is going to go to her heart.  Says she has been keeping her BP down okay.  She has appt for lab in the morning.

## 2013-05-23 NOTE — Telephone Encounter (Signed)
Please advise 

## 2013-05-23 NOTE — Telephone Encounter (Signed)
I cannot refill her furosemide because she is still in renal failure.  She was supposed to return tomorrow for repeat BMET and mg level. If her kidney funciton is back to baseline  We can consider resuming a fluid pill.  She has a strong heart and has no history of heart failure by the ECHO Dr Juliann Parescallwood did on her last year.

## 2013-05-23 NOTE — Telephone Encounter (Signed)
Tried to reach patient-no answer & mailbox full

## 2013-05-23 NOTE — Telephone Encounter (Signed)
Relevant patient education assigned to patient using Emmi. ° °

## 2013-05-24 ENCOUNTER — Emergency Department: Payer: Self-pay | Admitting: Emergency Medicine

## 2013-05-24 ENCOUNTER — Other Ambulatory Visit (INDEPENDENT_AMBULATORY_CARE_PROVIDER_SITE_OTHER): Payer: 59

## 2013-05-24 ENCOUNTER — Encounter: Payer: Self-pay | Admitting: Internal Medicine

## 2013-05-24 ENCOUNTER — Other Ambulatory Visit: Payer: Self-pay | Admitting: Internal Medicine

## 2013-05-24 ENCOUNTER — Ambulatory Visit (INDEPENDENT_AMBULATORY_CARE_PROVIDER_SITE_OTHER): Payer: 59 | Admitting: Internal Medicine

## 2013-05-24 DIAGNOSIS — R4189 Other symptoms and signs involving cognitive functions and awareness: Secondary | ICD-10-CM

## 2013-05-24 DIAGNOSIS — E876 Hypokalemia: Secondary | ICD-10-CM

## 2013-05-24 DIAGNOSIS — N179 Acute kidney failure, unspecified: Secondary | ICD-10-CM

## 2013-05-24 DIAGNOSIS — E1122 Type 2 diabetes mellitus with diabetic chronic kidney disease: Secondary | ICD-10-CM

## 2013-05-24 DIAGNOSIS — I10 Essential (primary) hypertension: Secondary | ICD-10-CM

## 2013-05-24 DIAGNOSIS — R079 Chest pain, unspecified: Secondary | ICD-10-CM

## 2013-05-24 DIAGNOSIS — E1129 Type 2 diabetes mellitus with other diabetic kidney complication: Secondary | ICD-10-CM

## 2013-05-24 DIAGNOSIS — I872 Venous insufficiency (chronic) (peripheral): Secondary | ICD-10-CM

## 2013-05-24 DIAGNOSIS — N189 Chronic kidney disease, unspecified: Secondary | ICD-10-CM

## 2013-05-24 LAB — RENAL FUNCTION PANEL
ALBUMIN: 3.5 g/dL (ref 3.4–5.0)
ANION GAP: 3 — AB (ref 7–16)
BUN: 14 mg/dL (ref 7–18)
CALCIUM: 9.4 mg/dL (ref 8.5–10.1)
Chloride: 105 mmol/L (ref 98–107)
Co2: 30 mmol/L (ref 21–32)
Creatinine: 2.33 mg/dL — ABNORMAL HIGH (ref 0.60–1.30)
EGFR (African American): 23 — ABNORMAL LOW
GFR CALC NON AF AMER: 20 — AB
Glucose: 209 mg/dL — ABNORMAL HIGH (ref 65–99)
Osmolality: 282 (ref 275–301)
POTASSIUM: 3.2 mmol/L — AB (ref 3.5–5.1)
Phosphorus: 3 mg/dL (ref 2.5–4.9)
Sodium: 138 mmol/L (ref 136–145)

## 2013-05-24 LAB — BASIC METABOLIC PANEL
Anion Gap: 7 (ref 7–16)
BUN: 14 mg/dL (ref 4–21)
BUN: 14 mg/dL (ref 7–18)
Calcium, Total: 9.1 mg/dL (ref 8.5–10.1)
Chloride: 107 mmol/L (ref 98–107)
Co2: 27 mmol/L (ref 21–32)
Creatinine: 2.22 mg/dL — ABNORMAL HIGH (ref 0.60–1.30)
Creatinine: 2.3 mg/dL — AB (ref 0.5–1.1)
GFR CALC AF AMER: 24 — AB
GFR CALC NON AF AMER: 21 — AB
Glucose: 209 mg/dL
Glucose: 210 mg/dL — ABNORMAL HIGH (ref 65–99)
OSMOLALITY: 288 (ref 275–301)
Potassium: 3.2 mmol/L — AB (ref 3.4–5.3)
Potassium: 3.5 mmol/L (ref 3.5–5.1)
Sodium: 138 mmol/L (ref 137–147)
Sodium: 141 mmol/L (ref 136–145)

## 2013-05-24 LAB — MAGNESIUM: MAGNESIUM: 2 mg/dL

## 2013-05-24 LAB — CBC WITH DIFFERENTIAL/PLATELET
Basophil #: 0 10*3/uL (ref 0.0–0.1)
Basophil %: 0.3 %
EOS PCT: 1.2 %
Eosinophil #: 0 10*3/uL (ref 0.0–0.7)
HCT: 29.4 % — ABNORMAL LOW (ref 35.0–47.0)
HGB: 10.5 g/dL — ABNORMAL LOW (ref 12.0–16.0)
Lymphocyte #: 1.1 10*3/uL (ref 1.0–3.6)
Lymphocyte %: 28.1 %
MCH: 32.7 pg (ref 26.0–34.0)
MCHC: 35.8 g/dL (ref 32.0–36.0)
MCV: 91 fL (ref 80–100)
MONO ABS: 0.3 x10 3/mm (ref 0.2–0.9)
MONOS PCT: 6.8 %
Neutrophil #: 2.4 10*3/uL (ref 1.4–6.5)
Neutrophil %: 63.6 %
Platelet: 221 10*3/uL (ref 150–440)
RBC: 3.22 10*6/uL — ABNORMAL LOW (ref 3.80–5.20)
RDW: 13.1 % (ref 11.5–14.5)
WBC: 3.8 10*3/uL (ref 3.6–11.0)

## 2013-05-24 MED ORDER — POTASSIUM CHLORIDE ER 10 MEQ PO CPCR: 20.0000 meq | ORAL_CAPSULE | Freq: Every day | ORAL | Status: DC

## 2013-05-24 MED ORDER — CLONIDINE HCL 0.2 MG PO TABS: ORAL_TABLET | ORAL | Status: DC

## 2013-05-24 MED ORDER — MAGNESIUM OXIDE 400 MG PO TABS: 800.0000 mg | ORAL_TABLET | Freq: Two times a day (BID) | ORAL | Status: DC

## 2013-05-24 NOTE — Assessment & Plan Note (Addendum)
She has been taking mg oxide 400 mg bid for mg of 1.2 and was referred to Short Stay for infusion last week but was never contacted.  Repeat level today has normalized and her dose has been reduced to 400 mg daily.

## 2013-05-24 NOTE — Progress Notes (Signed)
Pre-visit discussion using our clinic review tool. No additional management support is needed unless otherwise documented below in the visit note.  

## 2013-05-24 NOTE — Telephone Encounter (Signed)
Pt notified of information today during lab appointment. Pt was also seen today by Dr. Darrick Huntsmanullo & EKG performed
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
## 2013-05-24 NOTE — Patient Instructions (Signed)
I am glad you are losing weight, even if it is due to poor appetite. You need to lose 10 more lbs  To lose the "obesity" diagnosis  Please continue the potassium tablets (2 daily) and magnesium oxide  (increase to 4 daily) until ou are told otherwise  DO NOT RESUME spironolactone or furosemide until I tell you to  Your heart is strong,  You have no evidence of heart failure. The fluid in your lungs is NOT FROM HEART FAILURE.  It is due to lazy veins in your legs.  It will not cause heart failure  Venous Stasis or Chronic Venous Insufficiency Chronic venous insufficiency, also called venous stasis, is a condition that affects the veins in the legs. The condition prevents blood from being pumped through these veins effectively. Blood may no longer be pumped effectively from the legs back to the heart. This condition can range from mild to severe. With proper treatment, you should be able to continue with an active life. CAUSES  Chronic venous insufficiency occurs when the vein walls become stretched, weakened, or damaged or when valves within the vein are damaged. Some common causes of this include:  High blood pressure inside the veins (venous hypertension).  Increased blood pressure in the leg veins from long periods of sitting or standing.  A blood clot that blocks blood flow in a vein (deep vein thrombosis).  Inflammation of a superficial vein (phlebitis) that causes a blood clot to form. RISK FACTORS Various things can make you more likely to develop chronic venous insufficiency, including:  Family history of this condition.  Obesity.  Pregnancy.  Sedentary lifestyle.  Smoking.  Jobs requiring long periods of standing or sitting in one place.  Being a certain age. Women in their 7940s and 3650s and men in their 170s are more likely to develop this condition. SIGNS AND SYMPTOMS  Symptoms may include:   Varicose veins.  Skin breakdown or ulcers.  Reddened or discolored skin  on the leg.  Brown, smooth, tight, and painful skin just above the ankle, usually on the inside surface (lipodermatosclerosis).  Swelling. DIAGNOSIS  To diagnose this condition, your health care provider will take a medical history and do a physical exam. The following tests may be ordered to confirm the diagnosis:  Duplex ultrasound A procedure that produces a picture of a blood vessel and nearby organs and also provides information on blood flow through the blood vessel.  Plethysmography A procedure that tests blood flow.  A venogram, or venography A procedure used to look at the veins using X-ray and dye. TREATMENT The goals of treatment are to help you return to an active life and to minimize pain or disability. Treatment will depend on the severity of the condition. Medical procedures may be needed for severe cases. Treatment options may include:   Use of compression stockings. These can help with symptoms and lower the chances of the problem getting worse, but they do not cure the problem.  Sclerotherapy A procedure involving an injection of a material that "dissolves" the damaged veins. Other veins in the network of blood vessels take over the function of the damaged veins.  Surgery to remove the vein or cut off blood flow through the vein (vein stripping or laser ablation surgery).  Surgery to repair a valve. HOME CARE INSTRUCTIONS   Wear compression stockings as directed by your health care provider.  Only take over-the-counter or prescription medicines for pain, discomfort, or fever as directed by your health  care provider.  Follow up with your health care provider as directed. SEEK MEDICAL CARE IF:   You have redness, swelling, or increasing pain in the affected area.  You see a red streak or line that extends up or down from the affected area.  You have a breakdown or loss of skin in the affected area, even if the breakdown is small.  You have an injury to the  affected area. SEEK IMMEDIATE MEDICAL CARE IF:   You have an injury and open wound in the affected area.  Your pain is severe and does not improve with medicine.  You have sudden numbness or weakness in the foot or ankle below the affected area, or you have trouble moving your foot or ankle.  You have a fever or persistent symptoms for more than 2 3 days.  You have a fever and your symptoms suddenly get worse. MAKE SURE YOU:   Understand these instructions.  Will watch your condition.  Will get help right away if you are not doing well or get worse. Document Released: 08/16/2006 Document Revised: 01/31/2013 Document Reviewed: 12/18/2012 [facility] Patient Information 2014 [place], [place].

## 2013-05-25 ENCOUNTER — Ambulatory Visit: Payer: 59 | Admitting: Internal Medicine

## 2013-05-25 ENCOUNTER — Ambulatory Visit: Payer: Self-pay | Admitting: Internal Medicine

## 2013-05-25 ENCOUNTER — Telehealth: Payer: Self-pay | Admitting: Internal Medicine

## 2013-05-25 ENCOUNTER — Telehealth: Payer: Self-pay | Admitting: Emergency Medicine

## 2013-05-25 NOTE — Telephone Encounter (Signed)
Mailbox full unable to leave message but left number to call back.

## 2013-05-25 NOTE — Telephone Encounter (Signed)
She no longer needs to go to have the magnesium infusions and can reduce the magnesium tablets to once daily.  Continue potassium tablets 2 daily for 2 more days.  Kidney function is steadily improving  But not back to baseline yet,  So no diuretics

## 2013-05-25 NOTE — Telephone Encounter (Signed)
See note below

## 2013-05-25 NOTE — Assessment & Plan Note (Signed)
Repeat mg after oral supplementation only is 2.0 .  Does not need infusion.  Dose reduced ton 400 mg mg oxide daily

## 2013-05-25 NOTE — Telephone Encounter (Signed)
Spoke to pt's daughter Marella BileCoretta told her pt no longer needs to go to have the magnesium infusions and can reduce the magnesium tablets to once daily. Continue potassium tablets 2 daily for 2 more days. Kidney function is steadily improving But not back to baseline yet, So no diuretics. Coretta verbalized understanding.

## 2013-05-25 NOTE — Telephone Encounter (Signed)
pls cancel the magnesium infusion referral for [place].

## 2013-05-25 NOTE — Telephone Encounter (Signed)
The patient has an apt on 05/31/13 at 1015 with Dr. Neale BurlyGitten. Arriving @ 10am. The patient is aware of the apt.

## 2013-05-26 ENCOUNTER — Encounter: Payer: Self-pay | Admitting: Internal Medicine

## 2013-05-27 ENCOUNTER — Encounter: Payer: Self-pay | Admitting: Internal Medicine
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
## 2013-05-27 NOTE — Assessment & Plan Note (Addendum)
She is well controlled only without the diuretics and with concurrent use of ACE and ARB which is not ideal but without which she has elevations of systolic to 180.  Screening for secondary causes was done in the past.

## 2013-05-27 NOTE — Assessment & Plan Note (Addendum)
Long discussion with patient today and printed handout given.  Advised NOT to resume diuretics. Advised to purchase new stockings as her current ones are old and worn out.

## 2013-05-27 NOTE — Assessment & Plan Note (Signed)
Well-controlled on current medications.  hemoglobin A1c has been consistently less than 8.0 . She is in need of her annual eye exam and her foot exam is normal. She has microalbuminuria and  her cr has increased significantly due to recent episode of dehydration possibly due to overuse of Lasix..  She is on the appropriate medications. Referral to [facility] for annual eye exam .   Lab Results  Component Value Date   HGBA1C 6.6* 05/18/2013

## 2013-05-27 NOTE — Assessment & Plan Note (Signed)
Reviewed prior cardaic workup with patient today including recent ECHO showing normal EF./

## 2013-05-27 NOTE — Assessment & Plan Note (Addendum)
Secondary to dehydration. Patient may have taken excessive amounts of diuretic prior to ER evaluation.  It is difficult to get a clear history from her due to mild cognitive deficits.  Her renal function is improving despite continued use of ARB because I have suspended  furosemide and spironolactone.  Has overdue appt for CKD with nephrology next week. Patient advised NOT to resume diuretics. Spent 40 minutes with patient today,  explaining the etiology of her LE edema , which is due to venous insufficiency.   Lab Results  Component Value Date   CREATININE 2.3* 05/24/2013   Lab Results  Component Value Date   NA 138 05/24/2013   K 3.2* 05/24/2013   CL 100 05/18/2013   CO2 29 05/18/2013   Lab Results  Component Value Date   MICROALBUR 7.3* 01/09/2013

## 2013-05-27 NOTE — Assessment & Plan Note (Signed)
Persistent.  Will continue 20 meq daily supplementation for now.

## 2013-05-28 NOTE — Telephone Encounter (Signed)
Apt cancelled

## 2013-06-04 ENCOUNTER — Telehealth: Payer: Self-pay | Admitting: Internal Medicine

## 2013-06-04 DIAGNOSIS — N179 Acute kidney failure, unspecified: Secondary | ICD-10-CM

## 2013-06-04 NOTE — Telephone Encounter (Signed)
Pt calling to ask if it is okay to take her metformin again.  States her other doctor told her to take the Lasix.  Also taking potassium that Dr. Darrick Huntsmanullo told her to take.  Wants to make sure it is okay to take all these meds.  Would like a call.

## 2013-06-05 NOTE — Telephone Encounter (Signed)
Called patient to verify question. Patient reported she saw Dr. Thedore MinsSingh whom dc lisinopril and had patient restart lasix 40 mg once daily,  Patient is checking CBG weekly on Friday and reported cbg of 141 at least two hour post prandial, patient concerned she should she start back on metformin that was dc. Please advise.

## 2013-06-05 NOTE — Telephone Encounter (Signed)
Not right now. Please send Dr Thedore MinsSingh a copy of  My last 2 office notes ASAP and ask patient to have repeat BMET done on Friday of this week .

## 2013-06-05 NOTE — Telephone Encounter (Signed)
Have scheduled patient for labs.

## 2013-06-08 ENCOUNTER — Telehealth: Payer: Self-pay | Admitting: Internal Medicine

## 2013-06-08 ENCOUNTER — Other Ambulatory Visit: Payer: 59

## 2013-06-08 NOTE — Telephone Encounter (Signed)
[facility] left vm needing confirmation of Dr. Melina Schoolsullo's information regarding [name].  Needing to update their records.

## 2013-06-10 ENCOUNTER — Emergency Department: Payer: Self-pay | Admitting: Emergency Medicine

## 2013-06-10 LAB — COMPREHENSIVE METABOLIC PANEL
Albumin: 3.5 g/dL (ref 3.4–5.0)
Alkaline Phosphatase: 145 U/L — ABNORMAL HIGH
Anion Gap: 6 — ABNORMAL LOW (ref 7–16)
BUN: 18 mg/dL (ref 7–18)
Bilirubin,Total: 0.3 mg/dL (ref 0.2–1.0)
CALCIUM: 9.9 mg/dL (ref 8.5–10.1)
Chloride: 102 mmol/L (ref 98–107)
Co2: 28 mmol/L (ref 21–32)
Creatinine: 2.2 mg/dL — ABNORMAL HIGH (ref 0.60–1.30)
EGFR (African American): 25 — ABNORMAL LOW
EGFR (Non-African Amer.): 21 — ABNORMAL LOW
Glucose: 167 mg/dL — ABNORMAL HIGH (ref 65–99)
Osmolality: 278 (ref 275–301)
Potassium: 3.3 mmol/L — ABNORMAL LOW (ref 3.5–5.1)
SGOT(AST): 23 U/L (ref 15–37)
SGPT (ALT): 21 U/L (ref 12–78)
Sodium: 136 mmol/L (ref 136–145)
Total Protein: 7.4 g/dL (ref 6.4–8.2)

## 2013-06-10 LAB — CBC WITH DIFFERENTIAL/PLATELET
Basophil #: 0 10*3/uL (ref 0.0–0.1)
Basophil %: 0.6 %
Eosinophil #: 0.1 10*3/uL (ref 0.0–0.7)
Eosinophil %: 1.4 %
HCT: 30.8 % — AB (ref 35.0–47.0)
HGB: 11 g/dL — AB (ref 12.0–16.0)
LYMPHS ABS: 1.8 10*3/uL (ref 1.0–3.6)
Lymphocyte %: 40.6 %
MCH: 32.7 pg (ref 26.0–34.0)
MCHC: 35.8 g/dL (ref 32.0–36.0)
MCV: 91 fL (ref 80–100)
MONO ABS: 0.5 x10 3/mm (ref 0.2–0.9)
Monocyte %: 11 %
Neutrophil #: 2.1 10*3/uL (ref 1.4–6.5)
Neutrophil %: 46.4 %
Platelet: 229 10*3/uL (ref 150–440)
RBC: 3.37 10*6/uL — AB (ref 3.80–5.20)
RDW: 13.2 % (ref 11.5–14.5)
WBC: 4.5 10*3/uL (ref 3.6–11.0)

## 2013-06-10 LAB — LIPASE, BLOOD: LIPASE: 224 U/L (ref 73–393)

## 2013-06-10 LAB — TROPONIN I

## 2013-06-10 LAB — TSH: THYROID STIMULATING HORM: 13 u[IU]/mL — AB

## 2013-06-23 ENCOUNTER — Other Ambulatory Visit: Payer: Self-pay | Admitting: Internal Medicine

## 2013-06-25 ENCOUNTER — Encounter: Payer: Self-pay | Admitting: Internal Medicine

## 2013-06-25 ENCOUNTER — Other Ambulatory Visit: Payer: Self-pay | Admitting: *Deleted

## 2013-06-25 ENCOUNTER — Telehealth: Payer: Self-pay | Admitting: *Deleted

## 2013-06-25 ENCOUNTER — Ambulatory Visit (INDEPENDENT_AMBULATORY_CARE_PROVIDER_SITE_OTHER): Payer: 59 | Admitting: Internal Medicine

## 2013-06-25 ENCOUNTER — Encounter (INDEPENDENT_AMBULATORY_CARE_PROVIDER_SITE_OTHER): Payer: Self-pay

## 2013-06-25 VITALS — BP 168/60 | HR 71 | Temp 97.7°F | Resp 16 | Wt 187.0 lb

## 2013-06-25 DIAGNOSIS — F341 Dysthymic disorder: Secondary | ICD-10-CM

## 2013-06-25 DIAGNOSIS — I10 Essential (primary) hypertension: Secondary | ICD-10-CM

## 2013-06-25 DIAGNOSIS — E1129 Type 2 diabetes mellitus with other diabetic kidney complication: Secondary | ICD-10-CM

## 2013-06-25 DIAGNOSIS — E1122 Type 2 diabetes mellitus with diabetic chronic kidney disease: Secondary | ICD-10-CM

## 2013-06-25 DIAGNOSIS — F418 Other specified anxiety disorders: Secondary | ICD-10-CM

## 2013-06-25 DIAGNOSIS — N179 Acute kidney failure, unspecified: Secondary | ICD-10-CM

## 2013-06-25 DIAGNOSIS — N189 Chronic kidney disease, unspecified: Secondary | ICD-10-CM

## 2013-06-25 MED ORDER — CITALOPRAM HYDROBROMIDE 10 MG PO TABS: ORAL_TABLET | ORAL | Status: DC

## 2013-06-25 NOTE — Telephone Encounter (Signed)
Pharmacy called reguarding inter action between Nexium and celexa please advise.

## 2013-06-25 NOTE — Telephone Encounter (Signed)
Ok to fill,  Potential interaction acknowledged

## 2013-06-25 NOTE — Assessment & Plan Note (Signed)
She is not well controlled currently since Dr Thedore MinsSingh stopped the lisinopril  .  Only taking clonidine once daily .  Will increase to twice daily ,.

## 2013-06-25 NOTE — Progress Notes (Signed)
Pre-visit discussion using our clinic review tool. No additional management support is needed unless otherwise documented below in the visit note.  

## 2013-06-25 NOTE — Patient Instructions (Signed)
Change the clonidine to morning and night to improve your blood pressure reading
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
## 2013-06-25 NOTE — Telephone Encounter (Signed)
Appt today 06/25/13. 05/25/13 telephone note states stopping potassium after 2 more days use, no refill?

## 2013-06-26 ENCOUNTER — Telehealth: Payer: Self-pay | Admitting: Internal Medicine

## 2013-06-26 DIAGNOSIS — F418 Other specified anxiety disorders: Secondary | ICD-10-CM | POA: Insufficient documentation

## 2013-06-26 LAB — BASIC METABOLIC PANEL
BUN: 26 mg/dL — AB (ref 6–23)
CHLORIDE: 105 meq/L (ref 96–112)
CO2: 24 mEq/L (ref 19–32)
Calcium: 10.2 mg/dL (ref 8.4–10.5)
Creatinine, Ser: 1.9 mg/dL — ABNORMAL HIGH (ref 0.4–1.2)
GFR: 32.73 mL/min — AB (ref >60.00)
GLUCOSE: 179 mg/dL — AB (ref 70–99)
Potassium: 4.6 mEq/L (ref 3.5–5.1)
SODIUM: 139 meq/L (ref 135–145)

## 2013-06-26 LAB — MAGNESIUM: Magnesium: 2.2 mg/dL (ref 1.5–2.5)

## 2013-06-26 NOTE — Telephone Encounter (Signed)
Pharmacy notified to fill medication interaction noted.

## 2013-06-26 NOTE — Assessment & Plan Note (Addendum)
Majority of today's visit ,  25 mintues was spent discssing current mood disorde. Trial of SSRI with citalopram/ Return in one month

## 2013-06-26 NOTE — Assessment & Plan Note (Signed)
Repeat mg after oral supplementation only is 2.0 .  Does not need infusion

## 2013-06-26 NOTE — Assessment & Plan Note (Signed)
Well-controlled on current medications.  hemoglobin A1c has been consistently less than 8.0 . She is in need of her annual eye exam and her foot exam is normal. She has microalbuminuria and  her cr has increased significantly due to recent episode of dehydration possibly due to overuse of Lasix..  She is on the appropriate medications. Referral to [facility] for annual eye exam .   Lab Results  Component Value Date   HGBA1C 6.6* 05/18/2013

## 2013-06-26 NOTE — Telephone Encounter (Signed)
Relevant patient education mailed to patient.  

## 2013-06-29 ENCOUNTER — Other Ambulatory Visit: Payer: Self-pay | Admitting: Internal Medicine

## 2013-06-29 MED ORDER — POTASSIUM CHLORIDE ER 10 MEQ PO CPCR: 20.0000 meq | ORAL_CAPSULE | Freq: Every day | ORAL | Status: DC

## 2013-07-04 ENCOUNTER — Telehealth: Payer: Self-pay | Admitting: *Deleted

## 2013-07-04 ENCOUNTER — Ambulatory Visit (INDEPENDENT_AMBULATORY_CARE_PROVIDER_SITE_OTHER): Payer: 59 | Admitting: Internal Medicine

## 2013-07-04 VITALS — BP 116/48 | HR 79 | Temp 97.6°F | Resp 16 | Wt 186.0 lb

## 2013-07-04 DIAGNOSIS — E1122 Type 2 diabetes mellitus with diabetic chronic kidney disease: Secondary | ICD-10-CM

## 2013-07-04 DIAGNOSIS — I10 Essential (primary) hypertension: Secondary | ICD-10-CM

## 2013-07-04 DIAGNOSIS — E669 Obesity, unspecified: Secondary | ICD-10-CM

## 2013-07-04 DIAGNOSIS — D649 Anemia, unspecified: Secondary | ICD-10-CM

## 2013-07-04 DIAGNOSIS — N189 Chronic kidney disease, unspecified: Secondary | ICD-10-CM

## 2013-07-04 DIAGNOSIS — E876 Hypokalemia: Secondary | ICD-10-CM

## 2013-07-04 DIAGNOSIS — E1129 Type 2 diabetes mellitus with other diabetic kidney complication: Secondary | ICD-10-CM

## 2013-07-04 DIAGNOSIS — E1159 Type 2 diabetes mellitus with other circulatory complications: Secondary | ICD-10-CM

## 2013-07-04 LAB — CBC WITH DIFFERENTIAL/PLATELET
BASOS ABS: 0 10*3/uL (ref 0.0–0.1)
Basophils Relative: 0.4 % (ref 0.0–3.0)
EOS PCT: 1.2 % (ref 0.0–5.0)
Eosinophils Absolute: 0 10*3/uL (ref 0.0–0.7)
HEMATOCRIT: 30.8 % — AB (ref 36.0–46.0)
Hemoglobin: 10.6 g/dL — ABNORMAL LOW (ref 12.0–15.0)
LYMPHS ABS: 1.4 10*3/uL (ref 0.7–4.0)
Lymphocytes Relative: 34.4 % (ref 12.0–46.0)
MCHC: 34.4 g/dL (ref 30.0–36.0)
MCV: 92.3 fl (ref 78.0–100.0)
MONO ABS: 0.3 10*3/uL (ref 0.1–1.0)
MONOS PCT: 7.5 % (ref 3.0–12.0)
NEUTROS PCT: 56.5 % (ref 43.0–77.0)
Neutro Abs: 2.3 10*3/uL (ref 1.4–7.7)
PLATELETS: 197 10*3/uL (ref 150.0–400.0)
RBC: 3.33 Mil/uL — ABNORMAL LOW (ref 3.87–5.11)
RDW: 12.8 % (ref 11.5–14.6)
WBC: 4.1 10*3/uL — AB (ref 4.5–10.5)

## 2013-07-04 LAB — COMPREHENSIVE METABOLIC PANEL
ALBUMIN: 3.6 g/dL (ref 3.5–5.2)
ALT: 37 U/L — AB (ref 0–35)
AST: 36 U/L (ref 0–37)
Alkaline Phosphatase: 127 U/L — ABNORMAL HIGH (ref 39–117)
BUN: 27 mg/dL — ABNORMAL HIGH (ref 6–23)
CALCIUM: 9.3 mg/dL (ref 8.4–10.5)
CHLORIDE: 99 meq/L (ref 96–112)
CO2: 25 meq/L (ref 19–32)
Creatinine, Ser: 2.2 mg/dL — ABNORMAL HIGH (ref 0.4–1.2)
GFR: 28.72 mL/min — AB (ref >60.00)
GLUCOSE: 242 mg/dL — AB (ref 70–99)
POTASSIUM: 4.2 meq/L (ref 3.5–5.1)
SODIUM: 132 meq/L — AB (ref 135–145)
TOTAL PROTEIN: 6.9 g/dL (ref 6.0–8.3)
Total Bilirubin: 0.5 mg/dL (ref 0.3–1.2)

## 2013-07-04 LAB — VITAMIN B12

## 2013-07-04 LAB — FERRITIN: Ferritin: 18.3 ng/mL (ref 10.0–291.0)

## 2013-07-04 LAB — GLUCOSE, POCT (MANUAL RESULT ENTRY): POC Glucose: 246 mg/dl — AB (ref 70–99)

## 2013-07-04 MED ORDER — GLIPIZIDE 2.5 MG HALF TABLET: 2.5000 mg | ORAL_TABLET | Freq: Two times a day (BID) | ORAL | Status: DC

## 2013-07-04 MED ORDER — SPIRONOLACTONE 50 MG PO TABS: 50.0000 mg | ORAL_TABLET | Freq: Every day | ORAL | Status: DC

## 2013-07-04 MED ORDER — LACTULOSE 10 GM/15ML PO SOLN: 30.0000 g | Freq: Every day | ORAL | Status: DC | PRN

## 2013-07-04 MED ORDER — CLONIDINE HCL 0.1 MG PO TABS: 0.1000 mg | ORAL_TABLET | Freq: Two times a day (BID) | ORAL | Status: DC

## 2013-07-04 NOTE — Telephone Encounter (Signed)
Patient very confused as to medications scheduled patient to come into office as requested will be here at 1.30

## 2013-07-04 NOTE — Assessment & Plan Note (Addendum)
She wad not well controlled currently since Dr Thedore MinsSingh stopped the lisinopril  .  She has increased lethargy and fatigue since increasing her clonidine to twice daily.  Dose reduced to 0.1 mg twice daily and spironolactone added.  continue valsartan and atenolol bid. Retrun in one week for [potassium check

## 2013-07-04 NOTE — Progress Notes (Signed)
Pre-visit discussion using our clinic review tool. No additional management support is needed unless otherwise documented below in the visit note.  

## 2013-07-04 NOTE — Patient Instructions (Addendum)
For your diabetes:   I am stopping your metformin and starting glipizide 2.5 mg   1/2 tablet  twice daily before breakfast and before supper   Do not skip  meals .  Eat a liitle something at lunch time even if not hungry     2) For your blood pressure  I am reducing the clonidine dose because it is making you too sleepy .  Continue twice daily but Cut your dose in half until you finish the bottle  Stop the potassium  Start spironolactone one time daily in the morning.  Continue atenolol twice daily and valsartan once daily for blood pressure   For your constipation:  I will call in lactulose to take as needed for constipation , not more than twice a week.  Continue to use miralax daily

## 2013-07-04 NOTE — Progress Notes (Signed)
Patient ID: [name], female   DOB: 12-Oct-1935, 78 y.o.   MRN: [id number]   Patient Active Problem List   Diagnosis Date Noted  . Depression with anxiety 06/26/2013  . Hypokalemia 05/20/2013  . Hypomagnesemia 05/20/2013  . Acute renal failure 05/17/2013  . Viral URI 05/13/2013  . Hemorrhoids 11/06/2012  . Hidradenitis suppurativa 10/05/2012  . Unspecified hypothyroidism 07/24/2012  . Cognitive changes 01/18/2012  . Back pain, thoracic 11/27/2011  . Venous insufficiency of leg 11/24/2011  . Diabetes mellitus with chronic kidney disease 10/03/2011  . Anemia of chronic renal failure 10/03/2011  . Chest pain with normal coronary angiography 04/21/2011  . Screening for colon cancer 04/12/2011  . Screening for breast cancer 04/12/2011  . Hypertension 04/12/2011  . Hyperlipidemia LDL goal < 100 04/12/2011  . Screening for osteoporosis 04/12/2011    Subjective:  CC:   Chief Complaint  Patient presents with  . Follow-up  . Diabetes    HPI:   [name] is a 78 y.o. female who presents for  Uncontrolled Hypertension .  Patient has had uncontrolled blood sugars and blood pressures for the past week.  She was worked in today to review blood sugars,  Recent medication changes.  Has been increasingly drowsy,  Trouble staying awake during the day.  She is taking clonidine 0.2 mg twice daily   Home blood pressures have been normalized.   DM:  Her fasting blood  sugar this morning was 218 ,  Now 240 post prandially.  Her    A1c was 6.6  In January.  She is taking metformin 500 mg bid.    Past Medical History  Diagnosis Date  . Diabetes mellitus   . GERD (gastroesophageal reflux disease)   . Hypertension   . Anemia   . Arthritis   . Hemorrhoids     Past Surgical History  Procedure Laterality Date  . Cholecystectomy    . Abdominal hysterectomy    . Back surgery    . Arthroplasty w/ arthroscopy medial / lateral compartment knee  Feb 2011    [name]  . Hemorrhoid  surgery  12/19/12       The following portions of the patient's history were reviewed and updated as appropriate: Allergies, current medications, and problem list.    Review of Systems:   Patient denies headache, fevers, malaise, unintentional weight loss, skin rash, eye pain, sinus congestion and sinus pain, sore throat, dysphagia,  hemoptysis , cough, dyspnea, wheezing, chest pain, palpitations, orthopnea, edema, abdominal pain, nausea, melena, diarrhea, constipation, flank pain, dysuria, hematuria, urinary  Frequency, nocturia, numbness, tingling, seizures,  Focal weakness, Loss of consciousness,  Tremor, insomnia, depression, anxiety, and suicidal ideation.     History   Social History  . Marital Status: Single    Spouse Name: N/A    Number of Children: N/A  . Years of Education: N/A   Occupational History  . Not on file.   Social History Main Topics  . Smoking status: Former Smoker -- 30 years    Types: Cigarettes    Quit date: 09/28/2001  . Smokeless tobacco: Current User    Types: Snuff  . Alcohol Use: No  . Drug Use: No  . Sexual Activity: Not Currently   Other Topics Concern  . Not on file   Social History Narrative  . No narrative on file    Objective:  Filed Vitals:   07/04/13 1348  BP: 116/48  Pulse: 79  Temp: 97.6 F (36.4 C)  Resp:  16     General appearance: alert, cooperative and appears stated age Ears: normal TM's and external ear canals both ears Throat: lips, mucosa, and tongue normal; teeth and gums normal Neck: no adenopathy, no carotid bruit, supple, symmetrical, trachea midline and thyroid not enlarged, symmetric, no tenderness/mass/nodules Back: symmetric, no curvature. ROM normal. No CVA tenderness. Lungs: clear to auscultation bilaterally Heart: regular rate and rhythm, S1, S2 normal, no murmur, click, rub or gallop Abdomen: soft, non-tender; bowel sounds normal; no masses,  no organomegaly Pulses: 2+ and symmetric Skin: Skin  color, texture, turgor normal. No rashes or lesions Lymph nodes: Cervical, supraclavicular, and axillary nodes normal.  Assessment and Plan:  Hypertension She wad not well controlled currently since Dr Thedore MinsSingh stopped the lisinopril  .  She has increased lethargy and fatigue since increasing her clonidine to twice daily.  Dose reduced to 0.1 mg twice daily and spironolactone added.  continue valsartan and atenolol bid. Retrun in one week for [potassium check  Hypomagnesemia Resolved,  Supplement stopped   Hypokalemia Recurrent,  Will start spironolactone.  Return in on week   Diabetes mellitus with chronic kidney disease Adding glipizide 2.5 mg bid and stopping metformin given her CKD  Obesity, unspecified I have addressed  BMI and recommended wt loss of 10% of body weight over the next 6 months using a low glycemic index diet and regular exercise a minimum of 5 days per week.     Updated Medication List Outpatient Encounter Prescriptions as of 07/04/2013  Medication Sig  . ALPRAZolam (XANAX) 0.5 MG tablet TAKE ONE TABLET TWICE DAILY AS NEEDED FOR ANXIETY  . atenolol (TENORMIN) 100 MG tablet TAKE 1 TABLET BY MOUTH TWICE A DAY. for blood pressure  . citalopram (CELEXA) 10 MG tablet 1/2 tablet daily for the first week, then full tablet  . cloNIDine (CATAPRES) 0.1 MG tablet Take 1 tablet (0.1 mg total) by mouth 2 (two) times daily. TAKE ONE TABLET EVERY 12 HOURS FOR BLOOD PRESSURE  . CRESTOR 20 MG tablet TAKE ONE (1) TABLET EACH DAY  . furosemide (LASIX) 40 MG tablet Take 1 tablet by mouth daily.  Marland Kitchen. NEXIUM 40 MG capsule TAKE ONE (1) CAPSULE EACH DAY BEFORE BREAKFAST  . valsartan (DIOVAN) 320 MG tablet TAKE ONE (1) TABLET EACH DAY FOR BLOOD PRESSURE  . [DISCONTINUED] atenolol (TENORMIN) 100 MG tablet TAKE 1 TABLET BY MOUTH TWICE A DAY.  . [DISCONTINUED] cloNIDine (CATAPRES) 0.2 MG tablet 0.2 mg daily. TAKE ONE TABLET EVERY 12 HOURS FOR BLOOD PRESSURE  . [DISCONTINUED] potassium chloride  (MICRO-K) 10 MEQ CR capsule Take 2 capsules (20 mEq total) by mouth daily.  . [DISCONTINUED] valsartan (DIOVAN) 320 MG tablet TAKE ONE (1) TABLET EACH DAY  . glipiZIDE (GLUCOTROL) 2.5 mg TABS tablet Take 0.5 tablets (2.5 mg total) by mouth 2 (two) times daily before a meal.  . lactulose (CHRONULAC) 10 GM/15ML solution Take 45 mLs (30 g total) by mouth daily as needed for mild constipation. For constipation, not more than twice weekly  . spironolactone (ALDACTONE) 50 MG tablet Take 50 mg by mouth daily. FOR BLOOD PRESSURE  . [DISCONTINUED] doxycycline (ADOXA) 100 MG tablet Take 100 mg by mouth 2 (two) times daily.  . [DISCONTINUED] glipiZIDE (GLUCOTROL) 2.5 mg TABS tablet Take 0.5 tablets (2.5 mg total) by mouth 2 (two) times daily before a meal.  . [DISCONTINUED] levofloxacin (LEVAQUIN) 500 MG tablet Take 1 tablet (500 mg total) by mouth daily.  . [DISCONTINUED] lisinopril (PRINIVIL,ZESTRIL) 40 MG tablet TAKE ONE  TABLET TWICE DAILY  . [DISCONTINUED] magnesium oxide (MAG-OX) 400 MG tablet Take 2 tablets (800 mg total) by mouth 2 (two) times daily.  . [DISCONTINUED] metFORMIN (GLUCOPHAGE) 500 MG tablet TAKE ONE TABLET TWICE DAILY WITH A MEAL  . [DISCONTINUED] polyethylene glycol powder (GLYCOLAX/MIRALAX) powder 255 grams one bottle for colonoscopy prep  . [DISCONTINUED] spironolactone (ALDACTONE) 50 MG tablet Take 1 tablet (50 mg total) by mouth daily.

## 2013-07-05 LAB — IRON AND TIBC
%SAT: 18 % — ABNORMAL LOW (ref 20–55)
IRON: 70 ug/dL (ref 42–145)
TIBC: 391 ug/dL (ref 250–470)
UIBC: 321 ug/dL (ref 125–400)

## 2013-07-05 LAB — FOLATE RBC: RBC Folate: 713 ng/mL (ref >280)

## 2013-07-07 DIAGNOSIS — E669 Obesity, unspecified: Secondary | ICD-10-CM | POA: Insufficient documentation

## 2013-07-07 NOTE — Assessment & Plan Note (Signed)
I have addressed  BMI and recommended wt loss of 10% of body weight over the next 6 months using a low glycemic index diet and regular exercise a minimum of 5 days per week.   

## 2013-07-07 NOTE — Assessment & Plan Note (Addendum)
Adding glipizide 2.5 mg bid and stopping metformin given her CKD

## 2013-07-07 NOTE — Assessment & Plan Note (Signed)
Recurrent,  Will start spironolactone.  Return in on week

## 2013-07-07 NOTE — Assessment & Plan Note (Signed)
Resolved,  Supplement stopped

## 2013-07-11 ENCOUNTER — Telehealth: Payer: Self-pay

## 2013-07-11 NOTE — Telephone Encounter (Signed)
Relevant patient education mailed to patient.  

## 2013-07-19 ENCOUNTER — Encounter: Payer: Self-pay | Admitting: Internal Medicine

## 2013-07-19 ENCOUNTER — Ambulatory Visit (INDEPENDENT_AMBULATORY_CARE_PROVIDER_SITE_OTHER): Payer: PRIVATE HEALTH INSURANCE | Admitting: Internal Medicine

## 2013-07-19 VITALS — BP 118/50 | HR 77 | Temp 98.0°F | Resp 18 | Wt 185.5 lb

## 2013-07-19 DIAGNOSIS — E1122 Type 2 diabetes mellitus with diabetic chronic kidney disease: Secondary | ICD-10-CM

## 2013-07-19 DIAGNOSIS — Z79899 Other long term (current) drug therapy: Secondary | ICD-10-CM

## 2013-07-19 DIAGNOSIS — E1129 Type 2 diabetes mellitus with other diabetic kidney complication: Secondary | ICD-10-CM

## 2013-07-19 DIAGNOSIS — I10 Essential (primary) hypertension: Secondary | ICD-10-CM

## 2013-07-19 DIAGNOSIS — N189 Chronic kidney disease, unspecified: Secondary | ICD-10-CM

## 2013-07-19 DIAGNOSIS — Z23 Encounter for immunization: Secondary | ICD-10-CM

## 2013-07-19 LAB — BASIC METABOLIC PANEL
BUN: 38 mg/dL — ABNORMAL HIGH (ref 6–23)
CHLORIDE: 100 meq/L (ref 96–112)
CO2: 25 mEq/L (ref 19–32)
Calcium: 9.5 mg/dL (ref 8.4–10.5)
Creatinine, Ser: 2.5 mg/dL — ABNORMAL HIGH (ref 0.4–1.2)
GFR: 23.69 mL/min — ABNORMAL LOW (ref >60.00)
Glucose, Bld: 245 mg/dL — ABNORMAL HIGH (ref 70–99)
Potassium: 4.1 mEq/L (ref 3.5–5.1)
SODIUM: 134 meq/L — AB (ref 135–145)

## 2013-07-19 MED ORDER — CLONIDINE HCL 0.1 MG PO TABS: 0.1000 mg | ORAL_TABLET | Freq: Every day | ORAL | Status: DC

## 2013-07-19 NOTE — Progress Notes (Signed)
Pre-visit discussion using our clinic review tool. No additional management support is needed unless otherwise documented below in the visit note.  

## 2013-07-19 NOTE — Progress Notes (Signed)
Patient ID: [name], female   DOB: [birth year], 78 y.o.   MRN: [id number]  Patient Active Problem List   Diagnosis Date Noted  . Obesity, unspecified 07/07/2013  . Depression with anxiety 06/26/2013  . Hypomagnesemia 05/20/2013  . Acute renal failure 05/17/2013  . Viral URI 05/13/2013  . Hemorrhoids 11/06/2012  . Hidradenitis suppurativa 10/05/2012  . Unspecified hypothyroidism 07/24/2012  . Cognitive changes 01/18/2012  . Back pain, thoracic 11/27/2011  . Venous insufficiency of leg 11/24/2011  . Diabetes mellitus with chronic kidney disease 10/03/2011  . Anemia of chronic renal failure 10/03/2011  . Chest pain with normal coronary angiography 04/21/2011  . Screening for colon cancer 04/12/2011  . Screening for breast cancer 04/12/2011  . Hypertension 04/12/2011  . Hyperlipidemia LDL goal < 100 04/12/2011  . Screening for osteoporosis 04/12/2011    Subjective:  CC:   Chief Complaint  Patient presents with  . Follow-up  . Diabetes    HPI:   [name] is a 78 y.o. female who presents for  Follow up on diabetes mellitus Type 2, uncontrolled hypertension and lethargy.  At last visit we reduced dose pf clonidine two weeks ago due to lethargy and started spironolactone.  She feels more alert and less tired since making the medication change.    Lab Results  Component Value Date   HGBA1C 6.6* 05/18/2013    Not checking blood sugars bc a1c has been so good since starting glipizide.  Has not had any lows.  She has not seen [facility] in a year.      Past Medical History  Diagnosis Date  . Diabetes mellitus   . GERD (gastroesophageal reflux disease)   . Hypertension   . Anemia   . Arthritis   . Hemorrhoids     Past Surgical History  Procedure Laterality Date  . Cholecystectomy    . Abdominal hysterectomy    . Back surgery    . Arthroplasty w/ arthroscopy medial / lateral compartment knee  Feb 2011    [name]  . Hemorrhoid surgery  12/19/12       The  following portions of the patient's history were reviewed and updated as appropriate: Allergies, current medications, and problem list.    Review of Systems:   Patient denies headache, fevers, malaise, unintentional weight loss, skin rash, eye pain, sinus congestion and sinus pain, sore throat, dysphagia,  hemoptysis , cough, dyspnea, wheezing, chest pain, palpitations, orthopnea, edema, abdominal pain, nausea, melena, diarrhea, constipation, flank pain, dysuria, hematuria, urinary  Frequency, nocturia, numbness, tingling, seizures,  Focal weakness, Loss of consciousness,  Tremor, insomnia, depression, anxiety, and suicidal ideation.     History   Social History  . Marital Status: Single    Spouse Name: N/A    Number of Children: N/A  . Years of Education: N/A   Occupational History  . Not on file.   Social History Main Topics  . Smoking status: Former Smoker -- 30 years    Types: Cigarettes    Quit date: 09/28/2001  . Smokeless tobacco: Current User    Types: Snuff  . Alcohol Use: No  . Drug Use: No  . Sexual Activity: Not Currently   Other Topics Concern  . Not on file   Social History Narrative  . No narrative on file    Objective:  Filed Vitals:   07/19/13 1016  BP: 118/50  Pulse: 77  Temp: 98 F (36.7 C)  Resp: 18     General appearance: alert, cooperative  and appears stated age Ears: normal TM's and external ear canals both ears Throat: lips, mucosa, and tongue normal; teeth and gums normal Neck: no adenopathy, no carotid bruit, supple, symmetrical, trachea midline and thyroid not enlarged, symmetric, no tenderness/mass/nodules Back: symmetric, no curvature. ROM normal. No CVA tenderness. Lungs: clear to auscultation bilaterally Heart: regular rate and rhythm, S1, S2 normal, no murmur, click, rub or gallop Abdomen: soft, non-tender; bowel sounds normal; no masses,  no organomegaly Pulses: 2+ and symmetric Skin: Skin color, texture, turgor normal. No  rashes or lesions Lymph nodes: Cervical, supraclavicular, and axillary nodes normal.  Assessment and Plan:  Hypertension Improved control with less lethargy on reduced clonidine dose.   Diabetes mellitus with chronic kidney disease Blood sugars improving with addition of glipizide   Updated Medication List Outpatient Encounter Prescriptions as of 07/19/2013  Medication Sig  . ALPRAZolam (XANAX) 0.5 MG tablet TAKE ONE TABLET TWICE DAILY AS NEEDED FOR ANXIETY  . atenolol (TENORMIN) 100 MG tablet TAKE 1 TABLET BY MOUTH TWICE A DAY. for blood pressure  . citalopram (CELEXA) 10 MG tablet 1/2 tablet daily for the first week, then full tablet  . cloNIDine (CATAPRES) 0.1 MG tablet Take 1 tablet (0.1 mg total) by mouth daily. For blood pressure  . furosemide (LASIX) 40 MG tablet Take 1 tablet by mouth daily.  Marland Kitchen glipiZIDE (GLUCOTROL) 2.5 mg TABS tablet Take 0.5 tablets (2.5 mg total) by mouth 2 (two) times daily before a meal.  . lactulose (CHRONULAC) 10 GM/15ML solution Take 45 mLs (30 g total) by mouth daily as needed for mild constipation. For constipation, not more than twice weekly  . NEXIUM 40 MG capsule TAKE ONE (1) CAPSULE EACH DAY BEFORE BREAKFAST  . spironolactone (ALDACTONE) 50 MG tablet Take 50 mg by mouth daily. FOR BLOOD PRESSURE  . valsartan (DIOVAN) 320 MG tablet TAKE ONE (1) TABLET EACH DAY FOR BLOOD PRESSURE  . [DISCONTINUED] cloNIDine (CATAPRES) 0.1 MG tablet Take 1 tablet (0.1 mg total) by mouth 2 (two) times daily. TAKE ONE TABLET EVERY 12 HOURS FOR BLOOD PRESSURE  . CRESTOR 20 MG tablet TAKE ONE (1) TABLET EACH DAY     Orders Placed This Encounter  Procedures  . Pneumococcal conjugate vaccine 13-valent  . Basic metabolic panel  . [facility] DIABETES EYE EXAM    No Follow-up on file.

## 2013-07-19 NOTE — Patient Instructions (Addendum)
Your blood pressure is fine on the reduced dose of clonidine  We will reduce the clonidine 0.1 mg tablet to once daily in the evening,   Continue the spironolactone one tablet daily for now.   If your blood pressure becomes elevated after reducing the clonidine even more, let me know  We will repeat your A1c and fasting lipids after April 23rd

## 2013-07-21 NOTE — Assessment & Plan Note (Signed)
Blood sugars improving with addition of glipizide

## 2013-07-21 NOTE — Assessment & Plan Note (Signed)
Improved control with less lethargy on reduced clonidine dose.    

## 2013-07-23 ENCOUNTER — Encounter: Payer: Self-pay | Admitting: *Deleted

## 2013-07-30 ENCOUNTER — Other Ambulatory Visit: Payer: Self-pay | Admitting: Internal Medicine

## 2013-07-30 ENCOUNTER — Ambulatory Visit: Payer: 59 | Admitting: Internal Medicine

## 2013-07-30 NOTE — Telephone Encounter (Signed)
Please advise OK to fill? 

## 2013-07-31 ENCOUNTER — Telehealth: Payer: Self-pay | Admitting: Internal Medicine

## 2013-07-31 NOTE — Telephone Encounter (Signed)
The patient is completely out of her medication .  glipiZIDE (GLUCOTROL) 2.5 mg TABS tablet

## 2013-07-31 NOTE — Telephone Encounter (Signed)
Ok to refill,  printed rx  

## 2013-07-31 NOTE — Telephone Encounter (Signed)
Patient has 3 refills remaining on script tried to return call to patient no voicemail available.

## 2013-08-01 NOTE — Telephone Encounter (Signed)
Have tried numerous times to reach patient no voicemail available is full.

## 2013-08-02 ENCOUNTER — Telehealth: Payer: Self-pay | Admitting: Internal Medicine

## 2013-08-02 DIAGNOSIS — E1129 Type 2 diabetes mellitus with other diabetic kidney complication: Secondary | ICD-10-CM

## 2013-08-02 DIAGNOSIS — E1165 Type 2 diabetes mellitus with hyperglycemia: Principal | ICD-10-CM

## 2013-08-02 NOTE — Telephone Encounter (Signed)
Her A1c has risen to 8.8 by Dr Encarnacion ChuKollaru's labs.  Please make appt to review recent blood sugars

## 2013-08-02 NOTE — Assessment & Plan Note (Signed)
a1c 8.8 by Kollaru's labs  07/26/13

## 2013-08-03 NOTE — Telephone Encounter (Signed)
Pt made appointment 4/23 with dr Darrick Huntsmantullo

## 2013-08-03 NOTE — Telephone Encounter (Signed)
Patient notified

## 2013-08-16 ENCOUNTER — Encounter: Payer: Self-pay | Admitting: Internal Medicine

## 2013-08-16 ENCOUNTER — Ambulatory Visit (INDEPENDENT_AMBULATORY_CARE_PROVIDER_SITE_OTHER): Payer: 59 | Admitting: Internal Medicine

## 2013-08-16 VITALS — BP 152/60 | HR 68 | Temp 97.7°F | Resp 16 | Ht 63.0 in | Wt 189.8 lb

## 2013-08-16 DIAGNOSIS — R4189 Other symptoms and signs involving cognitive functions and awareness: Secondary | ICD-10-CM

## 2013-08-16 DIAGNOSIS — Z0000 Encounter for general adult medical examination without abnormal findings: Secondary | ICD-10-CM

## 2013-08-16 DIAGNOSIS — Z1239 Encounter for other screening for malignant neoplasm of breast: Secondary | ICD-10-CM

## 2013-08-16 DIAGNOSIS — S01311A Laceration without foreign body of right ear, initial encounter: Secondary | ICD-10-CM

## 2013-08-16 DIAGNOSIS — S01309A Unspecified open wound of unspecified ear, initial encounter: Secondary | ICD-10-CM

## 2013-08-16 DIAGNOSIS — E119 Type 2 diabetes mellitus without complications: Secondary | ICD-10-CM

## 2013-08-16 LAB — MICROALBUMIN / CREATININE URINE RATIO
Creatinine,U: 43.7 mg/dL
Microalb Creat Ratio: 34.1 mg/g — ABNORMAL HIGH (ref 0.0–30.0)
Microalb, Ur: 14.9 mg/dL — ABNORMAL HIGH (ref 0.0–1.9)

## 2013-08-16 LAB — HM DIABETES FOOT EXAM: HM DIABETIC FOOT EXAM: NORMAL

## 2013-08-16 LAB — COMPREHENSIVE METABOLIC PANEL
ALT: 26 U/L (ref 0–35)
AST: 23 U/L (ref 0–37)
Albumin: 3.9 g/dL (ref 3.5–5.2)
Alkaline Phosphatase: 135 U/L — ABNORMAL HIGH (ref 39–117)
BILIRUBIN TOTAL: 0.4 mg/dL (ref 0.3–1.2)
BUN: 29 mg/dL — ABNORMAL HIGH (ref 6–23)
CALCIUM: 9.8 mg/dL (ref 8.4–10.5)
CO2: 29 meq/L (ref 19–32)
CREATININE: 2.1 mg/dL — AB (ref 0.4–1.2)
Chloride: 100 mEq/L (ref 96–112)
GFR: 29.18 mL/min — AB (ref >60.00)
GLUCOSE: 169 mg/dL — AB (ref 70–99)
Potassium: 4 mEq/L (ref 3.5–5.1)
Sodium: 136 mEq/L (ref 135–145)
Total Protein: 7.3 g/dL (ref 6.0–8.3)

## 2013-08-16 LAB — LIPID PANEL
Cholesterol: 133 mg/dL (ref 0–200)
HDL: 47.3 mg/dL (ref >39.00)
LDL CALC: 69 mg/dL (ref 0–99)
Total CHOL/HDL Ratio: 3
Triglycerides: 86 mg/dL (ref 0.0–149.0)
VLDL: 17.2 mg/dL (ref 0.0–40.0)

## 2013-08-16 LAB — HEMOGLOBIN A1C: HEMOGLOBIN A1C: 8.5 % — AB (ref 4.6–6.5)

## 2013-08-16 MED ORDER — TETANUS-DIPHTH-ACELL PERTUSSIS 5-2.5-18.5 LF-MCG/0.5 IM SUSP: 0.5000 mL | Freq: Once | INTRAMUSCULAR | Status: DC

## 2013-08-16 MED ORDER — ZOSTER VACCINE LIVE 19400 UNT/0.65ML ~~LOC~~ SOLR: 0.6500 mL | Freq: Once | SUBCUTANEOUS | Status: DC

## 2013-08-16 NOTE — Progress Notes (Signed)
Pre-visit discussion using our clinic review tool. No additional management support is needed unless otherwise documented below in the visit note.  

## 2013-08-16 NOTE — Progress Notes (Signed)
Patient ID: [name], female   DOB: 1935-06-27, [age] y.o.   MRN: [id number]  The patient is here for annual Medicare wellness examination and management of other chronic and acute problems.   The risk factors are reflected in the social history.  The roster of all physicians providing medical care to patient - is listed in the Snapshot section of the chart.  Activities of daily living:  The patient is 100% independent in all ADLs: dressing, toileting, feeding as well as independent mobility  Home safety : The patient has smoke detectors in the home. They wear seatbelts.  There are no firearms at home. There is no violence in the home.   There is no risks for hepatitis, STDs or HIV. There is no   history of blood transfusion. They have no travel history to infectious disease endemic areas of the world.  The patient has seen their dentist in the last six month. They have seen their eye doctor in the last year. They admit to slight hearing difficulty with regard to whispered voices and some television programs.  They have deferred audiologic testing in the last year.  They do not  have excessive sun exposure. Discussed the need for sun protection: hats, long sleeves and use of sunscreen if there is significant sun exposure.   Diet: the importance of a healthy diet is discussed. They do have a healthy diet.  The benefits of regular aerobic exercise were discussed. She walks 4 times per week ,  20 minutes.   Depression screen: there are no signs or vegative symptoms of depression- irritability, change in appetite, anhedonia, sadness/tearfullness.  Cognitive assessment: the patient manages all their financial and personal affairs and is actively engaged. They could relate day,date,year and events; recalled 2/3 objects at 3 minutes; performed clock-face test normally.  The following portions of the patient's history were reviewed and updated as appropriate: allergies, current medications, past  family history, past medical history,  past surgical history, past social history  and problem list.  Visual acuity was not assessed per patient preference since she has regular follow up with her ophthalmologist. Hearing and body mass index were assessed and reviewed.   During the course of the visit the patient was educated and counseled about appropriate screening and preventive services including : fall prevention , diabetes screening, nutrition counseling, colorectal cancer screening, and recommended immunizations.    Objective: BP 152/60  Pulse 68  Temp(Src) 97.7 F (36.5 C) (Oral)  Resp 16  Ht 5\' 3"  (1.6 m)  Wt 189 lb 12 oz (86.07 kg)  BMI 33.62 kg/m2  SpO2 98%   General appearance: alert, cooperative and appears stated age Head: Normocephalic, without obvious abnormality, atraumatic Eyes: conjunctivae/corneas clear. PERRL, EOM's intact. Fundi benign. Ears: normal TM's and external ear canals both ears Nose: Nares normal. Septum midline. Mucosa normal. No drainage or sinus tenderness. Throat: lips, mucosa, and tongue normal; teeth and gums normal Neck: no adenopathy, no carotid bruit, no JVD, supple, symmetrical, trachea midline and thyroid not enlarged, symmetric, no tenderness/mass/nodules Lungs: clear to auscultation bilaterally Breasts: normal appearance, no masses or tenderness Heart: regular rate and rhythm, S1, S2 normal, no murmur, click, rub or gallop Abdomen: soft, non-tender; bowel sounds normal; no masses,  no organomegaly Extremities: extremities normal, atraumatic, no cyanosis or edema Pulses: 2+ and symmetric Skin: Skin color, texture, turgor normal. No rashes or lesions Neurologic: Alert and oriented X 3, normal strength and tone. Normal symmetric reflexes. Normal coordination and gait.  Assessment and Plan:  Cognitive changes MMSE was done today and patient scored 28/30 missing only qustions which involved two part calculation .  Visit for preventive  health examination Annual comprehensive exam was done including breast, excluding pelvic and PAP smear. All screenings have been addressed .   Laceration of ear, right, simple Slow healing,  Suggested she stop the antibiotic ointment and use petroleum jelly.  If still present in one month will refer to Dermatology for biopsy.    Updated Medication List Outpatient Encounter Prescriptions as of 08/16/2013  Medication Sig  . ALPRAZolam (XANAX) 0.5 MG tablet TAKE ONE TABLET TWICE DAILY AS NEEDED FOR ANXIETY  . atenolol (TENORMIN) 100 MG tablet TAKE 1 TABLET BY MOUTH TWICE A DAY. for blood pressure  . citalopram (CELEXA) 10 MG tablet 1/2 tablet daily for the first week, then full tablet  . cloNIDine (CATAPRES) 0.1 MG tablet Take 1 tablet (0.1 mg total) by mouth daily. For blood pressure  . CRESTOR 20 MG tablet TAKE ONE (1) TABLET EACH DAY  . furosemide (LASIX) 40 MG tablet Take 1 tablet by mouth daily.  Marland Kitchen. glipiZIDE (GLUCOTROL) 2.5 mg TABS tablet Take 0.5 tablets (2.5 mg total) by mouth 2 (two) times daily before a meal.  . lactulose (CHRONULAC) 10 GM/15ML solution Take 45 mLs (30 g total) by mouth daily as needed for mild constipation. For constipation, not more than twice weekly  . NEXIUM 40 MG capsule TAKE ONE (1) CAPSULE EACH DAY BEFORE BREAKFAST  . spironolactone (ALDACTONE) 50 MG tablet Take 50 mg by mouth daily. FOR BLOOD PRESSURE  . valsartan (DIOVAN) 320 MG tablet TAKE ONE (1) TABLET EACH DAY FOR BLOOD PRESSURE  . Tdap (BOOSTRIX) 5-2.5-18.5 LF-MCG/0.5 injection Inject 0.5 mLs into the muscle once.  . zoster vaccine live, PF, (ZOSTAVAX) [id number] UNT/0.65ML injection Inject 19,400 Units into the skin once.

## 2013-08-16 NOTE — Patient Instructions (Addendum)
You had your annual Medicare wellness exam today  We will schedule your mammogram soon.  You need to have a TDaP vaccine and a Shingles vaccine.  I have given you prescriptions for thses because they will be cheaper at the health Dept or at your  local pharmacy because Medicare will not reimburse for them.   We will contact you with the bloodwork results  Stop using antibiotic ointment on your ear.  It is not infected.   Just use vaseline on it daily to moisturize it  Return in one month to recheck your ear

## 2013-08-16 NOTE — Assessment & Plan Note (Addendum)
MMSE was done today and patient scored 28/30 missing only qustions which involved two part calculation .

## 2013-08-17 ENCOUNTER — Encounter: Payer: Self-pay | Admitting: Emergency Medicine

## 2013-08-17 ENCOUNTER — Encounter: Payer: Self-pay | Admitting: *Deleted

## 2013-08-18 DIAGNOSIS — Z0000 Encounter for general adult medical examination without abnormal findings: Secondary | ICD-10-CM | POA: Insufficient documentation

## 2013-08-18 DIAGNOSIS — S01311A Laceration without foreign body of right ear, initial encounter: Secondary | ICD-10-CM | POA: Insufficient documentation

## 2013-08-18 NOTE — Assessment & Plan Note (Signed)
Annual comprehensive exam was done including breast, excluding pelvic and PAP smear. All screenings have been addressed .  

## 2013-08-18 NOTE — Assessment & Plan Note (Signed)
Slow healing,  Suggested she stop the antibiotic ointment and use petroleum jelly.  If still present in one month will refer to Dermatology for biopsy.

## 2013-08-20 ENCOUNTER — Other Ambulatory Visit: Payer: Self-pay | Admitting: Internal Medicine

## 2013-08-20 NOTE — Telephone Encounter (Signed)
Ok refill Uloric? It had been stopped at 05/18/13 office visit

## 2013-08-21 ENCOUNTER — Telehealth: Payer: Self-pay | Admitting: *Deleted

## 2013-08-21 NOTE — Telephone Encounter (Signed)
Pt is coming in tomorrow what labs and dx?  

## 2013-08-22 ENCOUNTER — Other Ambulatory Visit: Payer: 59

## 2013-08-22 NOTE — Telephone Encounter (Signed)
Ms [name] does not need any labs,  She was asked to dreop off a log of her blood sugars.

## 2013-08-27 ENCOUNTER — Encounter: Payer: Self-pay | Admitting: Internal Medicine

## 2013-08-27 ENCOUNTER — Ambulatory Visit (INDEPENDENT_AMBULATORY_CARE_PROVIDER_SITE_OTHER): Payer: 59 | Admitting: Internal Medicine

## 2013-08-27 VITALS — BP 136/62 | HR 62 | Temp 97.8°F | Resp 16 | Wt 187.5 lb

## 2013-08-27 DIAGNOSIS — E1165 Type 2 diabetes mellitus with hyperglycemia: Secondary | ICD-10-CM

## 2013-08-27 DIAGNOSIS — I10 Essential (primary) hypertension: Secondary | ICD-10-CM

## 2013-08-27 DIAGNOSIS — E1129 Type 2 diabetes mellitus with other diabetic kidney complication: Secondary | ICD-10-CM

## 2013-08-27 DIAGNOSIS — R079 Chest pain, unspecified: Secondary | ICD-10-CM

## 2013-08-27 MED ORDER — GLIPIZIDE 5 MG PO TABS: 5.0000 mg | ORAL_TABLET | Freq: Two times a day (BID) | ORAL | Status: DC

## 2013-08-27 NOTE — Progress Notes (Signed)
Patient ID: [name], female   DOB: [birth year], 78 y.o.   MRN: [id number]  Patient Active Problem List   Diagnosis Date Noted  . Visit for preventive health examination 08/18/2013  . Laceration of ear, right, simple 08/18/2013  . Obesity, unspecified 07/07/2013  . Depression with anxiety 06/26/2013  . Hypomagnesemia 05/20/2013  . Acute renal failure 05/17/2013  . Viral URI 05/13/2013  . Hemorrhoids 11/06/2012  . Hidradenitis suppurativa 10/05/2012  . Unspecified hypothyroidism 07/24/2012  . Cognitive changes 01/18/2012  . Back pain, thoracic 11/27/2011  . Venous insufficiency of leg 11/24/2011  . Type II or unspecified type diabetes mellitus with renal manifestations, uncontrolled 10/03/2011  . Anemia of chronic renal failure 10/03/2011  . Chest pain with normal coronary angiography 04/21/2011  . Screening for colon cancer 04/12/2011  . Screening for breast cancer 04/12/2011  . Hypertension 04/12/2011  . Hyperlipidemia LDL goal < 100 04/12/2011  . Screening for osteoporosis 04/12/2011    Subjective:  CC:   Chief Complaint  Patient presents with  . Follow-up  . Diabetes    HPI:   [name] is a 78 y.o. female who presents for   Follow up on uncontrolled DM, .  She has increased her glipizide to   1.25 mg in am and 2.5 mg at night  Lab Results  Component Value Date   HGBA1C 8.5* 08/16/2013   Her blood sugars  Have been elevated to 190 at night.  Diet reviewed .  She is eating a lot of popcorn. Eating it daily sometimes twice daily.    Past Medical History  Diagnosis Date  . Diabetes mellitus   . GERD (gastroesophageal reflux disease)   . Hypertension   . Anemia   . Arthritis   . Hemorrhoids     Past Surgical History  Procedure Laterality Date  . Cholecystectomy    . Abdominal hysterectomy    . Back surgery    . Arthroplasty w/ arthroscopy medial / lateral compartment knee  Feb 2011    [name]  . Hemorrhoid surgery  12/19/12       The  following portions of the patient's history were reviewed and updated as appropriate: Allergies, current medications, and problem list.    Review of Systems:   Patient denies headache, fevers, malaise, unintentional weight loss, skin rash, eye pain, sinus congestion and sinus pain, sore throat, dysphagia,  hemoptysis , cough, dyspnea, wheezing, chest pain, palpitations, orthopnea, edema, abdominal pain, nausea, melena, diarrhea, constipation, flank pain, dysuria, hematuria, urinary  Frequency, nocturia, numbness, tingling, seizures,  Focal weakness, Loss of consciousness,  Tremor, insomnia, depression, anxiety, and suicidal ideation.     History   Social History  . Marital Status: Single    Spouse Name: N/A    Number of Children: N/A  . Years of Education: N/A   Occupational History  . Not on file.   Social History Main Topics  . Smoking status: Former Smoker -- 30 years    Types: Cigarettes    Quit date: 09/28/2001  . Smokeless tobacco: Current User    Types: Snuff  . Alcohol Use: No  . Drug Use: No  . Sexual Activity: Not Currently   Other Topics Concern  . Not on file   Social History Narrative  . No narrative on file    Objective:  Filed Vitals:   08/27/13 1059  BP: 136/62  Pulse: 62  Temp: 97.8 F (36.6 C)  Resp: 16     General appearance: alert, cooperative  and appears stated age Ears: normal TM's and external ear canals both ears Throat: lips, mucosa, and tongue normal; teeth and gums normal Neck: no adenopathy, no carotid bruit, supple, symmetrical, trachea midline and thyroid not enlarged, symmetric, no tenderness/mass/nodules Back: symmetric, no curvature. ROM normal. No CVA tenderness. Lungs: clear to auscultation bilaterally Heart: regular rate and rhythm, S1, S2 normal, no murmur, click, rub or gallop Abdomen: soft, non-tender; bowel sounds normal; no masses,  no organomegaly Pulses: 2+ and symmetric Skin: Skin color, texture, turgor normal. No  rashes or lesions Lymph nodes: Cervical, supraclavicular, and axillary nodes normal.  Assessment and Plan:  Hypertension Improved control with less lethargy on reduced clonidine dose.     Type II or unspecified type diabetes mellitus with renal manifestations, uncontrolled Elevated a1c since stopping metformin for ecreased GFR .Marland Kitchen.  Increasing glipizide today to 5 mg bid. Return in 2 weeks. I have reviewed her diet and discussed the benefits of a  low glycemic index diet and regular exercise a minimum of 5 days per week.   A total of 30 minutes of face to face time was spent with patient more than half of which was spent in counselling and coordination of care    Updated Medication List Outpatient Encounter Prescriptions as of 08/27/2013  Medication Sig  . ALPRAZolam (XANAX) 0.5 MG tablet TAKE ONE TABLET TWICE DAILY AS NEEDED FOR ANXIETY  . atenolol (TENORMIN) 100 MG tablet TAKE 1 TABLET BY MOUTH TWICE A DAY.  . citalopram (CELEXA) 10 MG tablet 1/2 tablet daily for the first week, then full tablet  . cloNIDine (CATAPRES) 0.1 MG tablet Take 1 tablet (0.1 mg total) by mouth daily. For blood pressure  . CRESTOR 20 MG tablet TAKE ONE (1) TABLET EACH DAY  . furosemide (LASIX) 40 MG tablet Take 1 tablet by mouth daily.  Marland Kitchen. glipiZIDE (GLUCOTROL) 5 MG tablet Take 1 tablet (5 mg total) by mouth 2 (two) times daily before a meal.  . lactulose (CHRONULAC) 10 GM/15ML solution Take 45 mLs (30 g total) by mouth daily as needed for mild constipation. For constipation, not more than twice weekly  . NEXIUM 40 MG capsule TAKE ONE (1) CAPSULE EACH DAY BEFORE BREAKFAST  . spironolactone (ALDACTONE) 50 MG tablet Take 50 mg by mouth daily. FOR BLOOD PRESSURE  . ULORIC 40 MG tablet TAKE ONE (1) TABLET EACH DAY  . valsartan (DIOVAN) 320 MG tablet TAKE ONE (1) TABLET EACH DAY FOR BLOOD PRESSURE  . [DISCONTINUED] glipiZIDE (GLUCOTROL) 2.5 mg TABS tablet Take 0.5 tablets (2.5 mg total) by mouth 2 (two) times daily  before a meal.  . Tdap (BOOSTRIX) 5-2.5-18.5 LF-MCG/0.5 injection Inject 0.5 mLs into the muscle once.  . zoster vaccine live, PF, (ZOSTAVAX) [id number] UNT/0.65ML injection Inject 19,400 Units into the skin once.     No orders of the defined types were placed in this encounter.    Return in about 2 days (around 08/29/2013).

## 2013-08-27 NOTE — Patient Instructions (Addendum)
Your diabetes is out of control, and your diet has too many carbohydrates in it. Your A1c was 8.5,  It needs to be < 7.0   Your goal is to have a fasting glucose below 125 and above 80.  2hr Post prandial ( 2 hours after a meal) should be < 150   Crackers and popcorn are full of carbohydrates.  Limit popcorn to twice weekly.  Snack on sweet peppers,   They are good for you and low in carbohydrates.  Pork rinds   Try changing your  crackers to [employer] brand , because are lower in carbohydrates.  One large cracker is only 10 carbohydrates  Joseph's Pita bread is low carb.  Try toasting it and using it with fried eggs,  For sandwiches,  And for dips .  I have given you a list of foods that are low in glycemic index.  You should eat more of thses     i am increasing your glipizide to 5 mg in the am and 5 mg in the evening with dinner.       Diabetes and Exercise Exercising regularly is important. It is not just about losing weight. It has many health benefits, such as:  Improving your overall fitness, flexibility, and endurance.  Increasing your bone density.  Helping with weight control.  Decreasing your body fat.  Increasing your muscle strength.  Reducing stress and tension.  Improving your overall health. People with diabetes who exercise gain additional benefits because exercise:  Reduces appetite.  Improves the body's use of blood sugar (glucose).  Helps lower or control blood glucose.  Decreases blood pressure.  Helps control blood lipids (such as cholesterol and triglycerides).  Improves the body's use of the hormone insulin by:  Increasing the body's insulin sensitivity.  Reducing the body's insulin needs.  Decreases the risk for heart disease because exercising:  Lowers cholesterol and triglycerides levels.  Increases the levels of good cholesterol (such as high-density lipoproteins [HDL]) in the body.  Lowers blood glucose levels. YOUR ACTIVITY PLAN   Choose an activity that you enjoy and set realistic goals. Your health care provider or diabetes educator can help you make an activity plan that works for you. You can break activities into 2 or 3 sessions throughout the day. Doing so is as good as one long session. Exercise ideas include:  Taking the dog for a walk.  Taking the stairs instead of the elevator.  Dancing to your favorite song.  Doing your favorite exercise with a friend. RECOMMENDATIONS FOR EXERCISING WITH TYPE 1 OR TYPE 2 DIABETES   Check your blood glucose before exercising. If blood glucose levels are greater than 240 mg/dL, check for urine ketones. Do not exercise if ketones are present.  Avoid injecting insulin into areas of the body that are going to be exercised. For example, avoid injecting insulin into:  The arms when playing tennis.  The legs when jogging.  Keep a record of:  Food intake before and after you exercise.  Expected peak times of insulin action.  Blood glucose levels before and after you exercise.  The type and amount of exercise you have done.  Review your records with your health care provider. Your health care provider will help you to develop guidelines for adjusting food intake and insulin amounts before and after exercising.  If you take insulin or oral hypoglycemic agents, watch for signs and symptoms of hypoglycemia. They include:  Dizziness.  Shaking.  Sweating.  Chills.  Confusion.  Drink plenty of water while you exercise to prevent dehydration or heat stroke. Body water is lost during exercise and must be replaced.  Talk to your health care provider before starting an exercise program to make sure it is safe for you. Remember, almost any type of activity is better than none. Document Released: 07/03/2003 Document Revised: 12/13/2012 Document Reviewed: 09/19/2012 [facility] Patient Information 2014 [place], [place].

## 2013-08-27 NOTE — Progress Notes (Signed)
Pre-visit discussion using our clinic review tool. No additional management support is needed unless otherwise documented below in the visit note.  

## 2013-08-28 NOTE — Assessment & Plan Note (Addendum)
Elevated a1c since stopping metformin for ecreased GFR .[name].  Increasing glipizide today to 5 mg bid. Return in 2 weeks. I have reviewed her diet and discussed the benefits of a  low glycemic index diet and regular exercise a minimum of 5 days per week.

## 2013-08-28 NOTE — Assessment & Plan Note (Signed)
Improved control with less lethargy on reduced clonidine dose.

## 2013-09-03 ENCOUNTER — Other Ambulatory Visit: Payer: Self-pay | Admitting: Internal Medicine

## 2013-09-05 ENCOUNTER — Telehealth: Payer: Self-pay

## 2013-09-05 NOTE — Telephone Encounter (Signed)
Relevant patient education mailed to patient.  

## 2013-09-07 ENCOUNTER — Other Ambulatory Visit: Payer: Self-pay | Admitting: Internal Medicine

## 2013-09-10 ENCOUNTER — Ambulatory Visit: Payer: 59 | Admitting: Internal Medicine

## 2013-09-20 ENCOUNTER — Ambulatory Visit (INDEPENDENT_AMBULATORY_CARE_PROVIDER_SITE_OTHER): Payer: 59 | Admitting: *Deleted

## 2013-09-20 ENCOUNTER — Encounter: Payer: Self-pay | Admitting: Internal Medicine

## 2013-09-20 ENCOUNTER — Encounter: Payer: Self-pay | Admitting: *Deleted

## 2013-09-20 ENCOUNTER — Ambulatory Visit (INDEPENDENT_AMBULATORY_CARE_PROVIDER_SITE_OTHER): Payer: 59 | Admitting: Internal Medicine

## 2013-09-20 VITALS — BP 144/88 | HR 78 | Temp 98.7°F | Resp 16 | Ht 63.0 in | Wt 193.0 lb

## 2013-09-20 VITALS — BP 158/58 | HR 68 | Temp 97.8°F | Resp 16 | Ht 63.0 in | Wt 193.0 lb

## 2013-09-20 DIAGNOSIS — I872 Venous insufficiency (chronic) (peripheral): Secondary | ICD-10-CM

## 2013-09-20 DIAGNOSIS — M545 Low back pain, unspecified: Secondary | ICD-10-CM

## 2013-09-20 DIAGNOSIS — M79609 Pain in unspecified limb: Secondary | ICD-10-CM

## 2013-09-20 DIAGNOSIS — R079 Chest pain, unspecified: Secondary | ICD-10-CM

## 2013-09-20 DIAGNOSIS — E1165 Type 2 diabetes mellitus with hyperglycemia: Secondary | ICD-10-CM

## 2013-09-20 DIAGNOSIS — E1310 Other specified diabetes mellitus with ketoacidosis without coma: Secondary | ICD-10-CM

## 2013-09-20 DIAGNOSIS — E1129 Type 2 diabetes mellitus with other diabetic kidney complication: Secondary | ICD-10-CM

## 2013-09-20 DIAGNOSIS — M546 Pain in thoracic spine: Secondary | ICD-10-CM

## 2013-09-20 DIAGNOSIS — M5431 Sciatica, right side: Secondary | ICD-10-CM

## 2013-09-20 DIAGNOSIS — E1110 Type 2 diabetes mellitus with ketoacidosis without coma: Secondary | ICD-10-CM

## 2013-09-20 DIAGNOSIS — I10 Essential (primary) hypertension: Secondary | ICD-10-CM

## 2013-09-20 DIAGNOSIS — M79602 Pain in left arm: Secondary | ICD-10-CM

## 2013-09-20 DIAGNOSIS — M5430 Sciatica, unspecified side: Secondary | ICD-10-CM

## 2013-09-20 DIAGNOSIS — I2510 Atherosclerotic heart disease of native coronary artery without angina pectoris: Secondary | ICD-10-CM

## 2013-09-20 LAB — GLUCOSE, POCT (MANUAL RESULT ENTRY): POC GLUCOSE: 385 mg/dL — AB (ref 70–99)

## 2013-09-20 NOTE — Progress Notes (Signed)
Pre-visit discussion using our clinic review tool. No additional management support is needed unless otherwise documented below in the visit note.  

## 2013-09-20 NOTE — Progress Notes (Signed)
Patient ID: [name], female   DOB: November 20, 1935, 78 y.o.   MRN: [id number]  Patient Active Problem List   Diagnosis Date Noted  . Arm pain, left 09/23/2013  . Back pain of thoracolumbar region 09/23/2013  . Visit for preventive health examination 08/18/2013  . Laceration of ear, right, simple 08/18/2013  . Obesity, unspecified 07/07/2013  . Depression with anxiety 06/26/2013  . Hypomagnesemia 05/20/2013  . Acute renal failure 05/17/2013  . Viral URI 05/13/2013  . Hemorrhoids 11/06/2012  . Hidradenitis suppurativa 10/05/2012  . Unspecified hypothyroidism 07/24/2012  . Cognitive changes 01/18/2012  . Back pain, thoracic 11/27/2011  . Venous insufficiency of leg 11/24/2011  . Type II or unspecified type diabetes mellitus with renal manifestations, uncontrolled 10/03/2011  . Anemia of chronic renal failure 10/03/2011  . Chest pain with normal coronary angiography 04/21/2011  . Screening for colon cancer 04/12/2011  . Screening for breast cancer 04/12/2011  . Hypertension 04/12/2011  . Hyperlipidemia LDL goal < 100 04/12/2011  . Screening for osteoporosis 04/12/2011    Subjective:  CC:   Chief Complaint  Patient presents with  . Follow-up  . Diabetes    HPI:   [name] is a 78 y.o. female who presents for follow up on multiple issues  1) Here for diabetes dollow up but did not bring log and has multiple  pain complaints.   2) Having trouble with her back.  Lower back,  History of fusion by [name] 1999.  Has been in pain constantly for the last several weeks , 4 to 6 ,.  Walking with a cane,  Radiates to inner thigh..  Has history of knee trauma that required orthopedic surgery  In 2011 by [name],  3) left arm has been hurting since she received the Pneumovax vaccine in March .  Reports that the entire arm hurts down to the wrist.    4) HTN: home blood pressuress have been elevated in the am.    Past Medical History  Diagnosis Date  . Diabetes mellitus   .  GERD (gastroesophageal reflux disease)   . Hypertension   . Anemia   . Arthritis   . Hemorrhoids     Past Surgical History  Procedure Laterality Date  . Cholecystectomy    . Abdominal hysterectomy    . Back surgery    . Arthroplasty w/ arthroscopy medial / lateral compartment knee  Feb 2011    [name]  . Hemorrhoid surgery  12/19/12       The following portions of the patient's history were reviewed and updated as appropriate: Allergies, current medications, and problem list.    Review of Systems:   Patient denies headache, fevers, malaise, unintentional weight loss, skin rash, eye pain, sinus congestion and sinus pain, sore throat, dysphagia,  hemoptysis , cough, dyspnea, wheezing, chest pain, palpitations, orthopnea, edema, abdominal pain, nausea, melena, diarrhea, constipation, flank pain, dysuria, hematuria, urinary  Frequency, nocturia, numbness, tingling, seizures,  Focal weakness, Loss of consciousness,  Tremor, insomnia, depression, anxiety, and suicidal ideation.     History   Social History  . Marital Status: Single    Spouse Name: N/A    Number of Children: N/A  . Years of Education: N/A   Occupational History  . Not on file.   Social History Main Topics  . Smoking status: Former Smoker -- 30 years    Types: Cigarettes    Quit date: 09/28/2001  . Smokeless tobacco: Current User    Types: Snuff  . Alcohol  Use: No  . Drug Use: No  . Sexual Activity: Not Currently   Other Topics Concern  . Not on file   Social History Narrative  . No narrative on file    Objective:  Filed Vitals:   09/20/13 1043  BP: 158/58  Pulse: 68  Temp: 97.8 F (36.6 C)  Resp: 16     General appearance: alert, cooperative and appears stated age Ears: normal TM's and external ear canals both ears Throat: lips, mucosa, and tongue normal; teeth and gums normal Neck: no adenopathy, no carotid bruit, supple, symmetrical, trachea midline and thyroid not enlarged,  symmetric, no tenderness/mass/nodules Back: symmetric, no curvature. ROM normal. No CVA tenderness. Lungs: clear to auscultation bilaterally Heart: regular rate and rhythm, S1, S2 normal, no murmur, click, rub or gallop Abdomen: soft, non-tender; bowel sounds normal; no masses,  no organomegaly Pulses: 2+ and symmetric Skin: Skin color, texture, turgor normal. No rashes or lesions Lymph nodes: Cervical, supraclavicular, and axillary nodes normal.  Assessment and Plan:  Chest pain with normal coronary angiography Has only seen [facility] once since he joined [employer].   Type II or unspecified type diabetes mellitus with renal manifestations, uncontrolled patient will return with home glucometer for calibration   Arm pain, left Ultrasound ordered to rule out DVT in left arm   Back pain of thoracolumbar region With prior surgical fusions.  PT ordered.   Venous insufficiency of leg Suspected by exam and history.  Pathophysiology explained and Proper use of compression stockings described.  Hypertension Improved control with less lethargy on reduced clonidine dose.  Nighttime dose increased today however due to patient reports in increased BPs in the am.        Updated Medication List Outpatient Encounter Prescriptions as of 09/20/2013  Medication Sig  . ALPRAZolam (XANAX) 0.5 MG tablet TAKE ONE TABLET TWICE DAILY AS NEEDED FOR ANXIETY  . atenolol (TENORMIN) 100 MG tablet TAKE 1 TABLET BY MOUTH TWICE A DAY.  . citalopram (CELEXA) 10 MG tablet 1/2 tablet daily for the first week, then full tablet  . cloNIDine (CATAPRES) 0.1 MG tablet 1 tablet in am and 2 in the PM  . CRESTOR 20 MG tablet TAKE ONE (1) TABLET EACH DAY  . furosemide (LASIX) 40 MG tablet Take 1 tablet by mouth daily.  Marland Kitchen glipiZIDE (GLUCOTROL) 5 MG tablet Take 1 tablet (5 mg total) by mouth 2 (two) times daily before a meal.  . lactulose (CHRONULAC) 10 GM/15ML solution Take 45 mLs (30 g total) by mouth daily as  needed for mild constipation. For constipation, not more than twice weekly  . NEXIUM 40 MG capsule TAKE ONE (1) CAPSULE EACH DAY BEFORE BREAKFAST  . spironolactone (ALDACTONE) 50 MG tablet Take 50 mg by mouth daily. FOR BLOOD PRESSURE  . ULORIC 40 MG tablet TAKE ONE (1) TABLET EACH DAY  . valsartan (DIOVAN) 320 MG tablet TAKE ONE (1) TABLET EACH DAY FOR BLOOD PRESSURE  . [DISCONTINUED] cloNIDine (CATAPRES) 0.1 MG tablet TAKE ONE (1) TABLET EACH DAY FOR BLOOD PRESSURE  . Tdap (BOOSTRIX) 5-2.5-18.5 LF-MCG/0.5 injection Inject 0.5 mLs into the muscle once.  . zoster vaccine live, PF, (ZOSTAVAX) [id number] UNT/0.65ML injection Inject 19,400 Units into the skin once.     Orders Placed This Encounter  Procedures  . For home use only DME Other see comment  . US Venous Img Upper Uni Left  . Ambulatory referral to Cardiology  . Ambulatory referral to Physical Therapy    No Follow-up on  file.

## 2013-09-20 NOTE — Assessment & Plan Note (Signed)
Has only seen [facility] once since he joined [employer].

## 2013-09-20 NOTE — Patient Instructions (Addendum)
You can take two clonidine at night and one in the morning for blood pressure   You need to drop off your blood sugar logs this afternoon and bring your glucose meter, so Olegario Messier can check to see if it is accurate.   I cannot adjust your medications without this information    I am ordering an ultrasound of your left arm to rule out a blood clot  I am ordering a physical therapy evaluation for your back pain   Your leg swelling must be managed with daily use of compression stockings .  I have given you a prescription for stokcings to take to [facility]

## 2013-09-22 ENCOUNTER — Other Ambulatory Visit: Payer: Self-pay | Admitting: Internal Medicine

## 2013-09-23 DIAGNOSIS — M79602 Pain in left arm: Secondary | ICD-10-CM | POA: Insufficient documentation

## 2013-09-23 DIAGNOSIS — M546 Pain in thoracic spine: Secondary | ICD-10-CM

## 2013-09-23 DIAGNOSIS — M545 Low back pain: Secondary | ICD-10-CM | POA: Insufficient documentation

## 2013-09-23 MED ORDER — CLONIDINE HCL 0.1 MG PO TABS: ORAL_TABLET | ORAL | Status: DC

## 2013-09-23 NOTE — Assessment & Plan Note (Signed)
With prior surgical fusions.  PT ordered.

## 2013-09-23 NOTE — Assessment & Plan Note (Signed)
Improved control with less lethargy on reduced clonidine dose.  Nighttime dose increased today however due to patient reports in increased BPs in the am.

## 2013-09-23 NOTE — Assessment & Plan Note (Signed)
patient will return with home glucometer for calibration

## 2013-09-23 NOTE — Assessment & Plan Note (Signed)
Suspected by exam and history.    Pathophysiology explained and Proper use of compression stockings described. 

## 2013-09-23 NOTE — Assessment & Plan Note (Signed)
Ultrasound ordered to rule out DVT in left arm

## 2013-09-24 ENCOUNTER — Encounter: Payer: Self-pay | Admitting: *Deleted

## 2013-10-01 ENCOUNTER — Telehealth: Payer: Self-pay | Admitting: Internal Medicine

## 2013-10-01 MED ORDER — GLUCOSE BLOOD VI STRP: ORAL_STRIP | Status: DC

## 2013-10-01 MED ORDER — ONETOUCH ULTRA SYSTEM W/DEVICE KIT
1.0000 | PACK | Freq: Once | Status: DC
Start: 2013-10-01 — End: 2014-09-11

## 2013-10-01 NOTE — Telephone Encounter (Signed)
Spoke to pt, requests to try OneTouch Ultra. Rx sent to pharmacy by escript, pt aware

## 2013-10-01 NOTE — Telephone Encounter (Signed)
Patient called in states she needs a complete new glucose meter she states the one that she got here isn't any good. She uses [facility] she said that they will deliver  she said that she is out of test strips etc. Please let patient know this has been called in.

## 2013-10-08 ENCOUNTER — Encounter: Payer: Self-pay | Admitting: Internal Medicine

## 2013-10-11 ENCOUNTER — Telehealth: Payer: Self-pay | Admitting: Internal Medicine

## 2013-10-11 DIAGNOSIS — IMO0001 Reserved for inherently not codable concepts without codable children: Secondary | ICD-10-CM

## 2013-10-11 DIAGNOSIS — E1165 Type 2 diabetes mellitus with hyperglycemia: Principal | ICD-10-CM

## 2013-10-11 MED ORDER — GLIPIZIDE 10 MG PO TABS: 10.0000 mg | ORAL_TABLET | Freq: Two times a day (BID) | ORAL | Status: DC

## 2013-10-11 NOTE — Telephone Encounter (Addendum)
Patient notified and voiced understanding but became very upset when the discussion of insulin. Patient appointment set for 10/15/13

## 2013-10-11 NOTE — Telephone Encounter (Signed)
[facility] help her understand how and what to eat.  I am making the referral today .  In the meantime, I agree with giving her the list of low glycemic index foods. Have her increase the glipizide to 10 mg twice daily,  She is currently taking 5 mg .  Give her an appt next week with me. Bring blood sugar log. Warn her that we will probably start bedtime insulin

## 2013-10-11 NOTE — Telephone Encounter (Signed)
Patient reports eating Moon pies and Honey graham cracker 10 t0 15 at a time. Advised patient to come by office and pick up a list of low Glycemic foods she can eat. Patient taking advise,

## 2013-10-11 NOTE — Telephone Encounter (Signed)
States sugars no matter how she takes her meds is staying in high 300's.  372, 392, 326.  Was 396 this morning.  States lowest she has been since last seeing Dr. Darrick Huntsmanullo and starting new meds is 293.  States she feels okay but knows the sugars are too high.  States she is checking them closely.  Transferred to triage in office.

## 2013-10-15 ENCOUNTER — Ambulatory Visit (INDEPENDENT_AMBULATORY_CARE_PROVIDER_SITE_OTHER): Payer: 59 | Admitting: Internal Medicine

## 2013-10-15 ENCOUNTER — Encounter: Payer: Self-pay | Admitting: Internal Medicine

## 2013-10-15 VITALS — BP 168/60 | HR 69 | Temp 97.7°F | Resp 16 | Ht 63.0 in | Wt 191.5 lb

## 2013-10-15 DIAGNOSIS — E1129 Type 2 diabetes mellitus with other diabetic kidney complication: Secondary | ICD-10-CM

## 2013-10-15 DIAGNOSIS — E139 Other specified diabetes mellitus without complications: Secondary | ICD-10-CM

## 2013-10-15 DIAGNOSIS — E089 Diabetes mellitus due to underlying condition without complications: Secondary | ICD-10-CM

## 2013-10-15 DIAGNOSIS — E1165 Type 2 diabetes mellitus with hyperglycemia: Secondary | ICD-10-CM

## 2013-10-15 MED ORDER — LINAGLIPTIN 5 MG PO TABS: 5.0000 mg | ORAL_TABLET | Freq: Every day | ORAL | Status: DC

## 2013-10-15 MED ORDER — INSULIN ISOPHANE & REGULAR (HUMAN 70-30)100 UNIT/ML KWIKPEN: PEN_INJECTOR | SUBCUTANEOUS | Status: DC

## 2013-10-15 NOTE — Progress Notes (Signed)
Patient ID: [name], female   DOB: 05-01-1935, 78 y.o.   MRN: [id number]   Patient Active Problem List   Diagnosis Date Noted  . Arm pain, left 09/23/2013  . Back pain of thoracolumbar region 09/23/2013  . Visit for preventive health examination 08/18/2013  . Laceration of ear, right, simple 08/18/2013  . Obesity, unspecified 07/07/2013  . Depression with anxiety 06/26/2013  . Hypomagnesemia 05/20/2013  . Acute renal failure 05/17/2013  . Viral URI 05/13/2013  . Hemorrhoids 11/06/2012  . Hidradenitis suppurativa 10/05/2012  . Unspecified hypothyroidism 07/24/2012  . Cognitive changes 01/18/2012  . Back pain, thoracic 11/27/2011  . Venous insufficiency of leg 11/24/2011  . Type II or unspecified type diabetes mellitus with renal manifestations, uncontrolled 10/03/2011  . Anemia of chronic renal failure 10/03/2011  . Chest pain with normal coronary angiography 04/21/2011  . Screening for colon cancer 04/12/2011  . Screening for breast cancer 04/12/2011  . Hypertension 04/12/2011  . Hyperlipidemia LDL goal < 100 04/12/2011  . Screening for osteoporosis 04/12/2011    Subjective:  CC:   Chief Complaint  Patient presents with  . Diabetes    patient reports CBG = 300 ,322    HPI:   [name] is a 78 y.o. female who presents for   Past Medical History  Diagnosis Date  . Diabetes mellitus   . GERD (gastroesophageal reflux disease)   . Hypertension   . Anemia   . Arthritis   . Hemorrhoids     Past Surgical History  Procedure Laterality Date  . Cholecystectomy    . Abdominal hysterectomy    . Back surgery    . Arthroplasty w/ arthroscopy medial / lateral compartment knee  Feb 2011    [name]  . Hemorrhoid surgery  12/19/12       The following portions of the patient's history were reviewed and updated as appropriate: Allergies, current medications, and problem list.    Review of Systems:   Patient denies headache, fevers, malaise, unintentional  weight loss, skin rash, eye pain, sinus congestion and sinus pain, sore throat, dysphagia,  hemoptysis , cough, dyspnea, wheezing, chest pain, palpitations, orthopnea, edema, abdominal pain, nausea, melena, diarrhea, constipation, flank pain, dysuria, hematuria, urinary  Frequency, nocturia, numbness, tingling, seizures,  Focal weakness, Loss of consciousness,  Tremor, insomnia, depression, anxiety, and suicidal ideation.     History   Social History  . Marital Status: Single    Spouse Name: N/A    Number of Children: N/A  . Years of Education: N/A   Occupational History  . Not on file.   Social History Main Topics  . Smoking status: Former Smoker -- 30 years    Types: Cigarettes    Quit date: 09/28/2001  . Smokeless tobacco: Current User    Types: Snuff  . Alcohol Use: No  . Drug Use: No  . Sexual Activity: Not Currently   Other Topics Concern  . Not on file   Social History Narrative  . No narrative on file    Objective:  Filed Vitals:   10/15/13 1014  BP: 168/60  Pulse: 69  Temp: 97.7 F (36.5 C)  Resp: 16     General appearance: alert, cooperative and appears stated age Ears: normal TM's and external ear canals both ears Throat: lips, mucosa, and tongue normal; teeth and gums normal Neck: no adenopathy, no carotid bruit, supple, symmetrical, trachea midline and thyroid not enlarged, symmetric, no tenderness/mass/nodules Back: symmetric, no curvature. ROM normal. No CVA tenderness.  Lungs: clear to auscultation bilaterally Heart: regular rate and rhythm, S1, S2 normal, no murmur, click, rub or gallop Abdomen: soft, non-tender; bowel sounds normal; no masses,  no organomegaly Pulses: 2+ and symmetric Skin: Skin color, texture, turgor normal. No rashes or lesions Lymph nodes: Cervical, supraclavicular, and axillary nodes normal.  Assessment and Plan:  Type II or unspecified type diabetes mellitus with renal manifestations, uncontrolled Secondary to dietary  noncompliance propagated by meter malfunction .  New meter,  Low Gi diet started.  Insulin recommendations heeded but patient unable to handle commitment to insulin .  rx for onglyza given. Return in 2 weeks with log of sugars. Referal to [facility] education program.    A total of 40 minutes was spent with patient more than half of which was spent in counseling and coordination of care.   Updated Medication List Outpatient Encounter Prescriptions as of 10/15/2013  Medication Sig  . ALPRAZolam (XANAX) 0.5 MG tablet TAKE ONE TABLET TWICE DAILY AS NEEDED FOR ANXIETY  . atenolol (TENORMIN) 100 MG tablet TAKE 1 TABLET BY MOUTH TWICE A DAY.  Marland Kitchen Blood Glucose Monitoring Suppl (ONE TOUCH ULTRA SYSTEM KIT) W/DEVICE KIT 1 kit by Does not apply route once.  . cloNIDine (CATAPRES) 0.1 MG tablet 1 tablet in am and 2 in the PM  . CRESTOR 20 MG tablet TAKE ONE (1) TABLET EACH DAY  . furosemide (LASIX) 40 MG tablet Take 1 tablet by mouth daily.  Marland Kitchen glipiZIDE (GLUCOTROL) 10 MG tablet Take 1 tablet (10 mg total) by mouth 2 (two) times daily before a meal.  . glucose blood (ONE TOUCH ULTRA TEST) test strip Check sugar twice daily Dx 250.42  . lactulose (CHRONULAC) 10 GM/15ML solution Take 45 mLs (30 g total) by mouth daily as needed for mild constipation. For constipation, not more than twice weekly  . NEXIUM 40 MG capsule TAKE ONE (1) CAPSULE EACH DAY BEFORE BREAKFAST  . spironolactone (ALDACTONE) 50 MG tablet TAKE ONE (1) TABLET EACH DAY  . ULORIC 40 MG tablet TAKE ONE (1) TABLET EACH DAY  . valsartan (DIOVAN) 320 MG tablet TAKE ONE (1) TABLET EACH DAY FOR BLOOD PRESSURE  . citalopram (CELEXA) 10 MG tablet 1/2 tablet daily for the first week, then full tablet  . linagliptin (TRADJENTA) 5 MG TABS tablet Take 1 tablet (5 mg total) by mouth daily.  . [DISCONTINUED] Insulin Isophane & Regular Human (HUMULIN 70/30 KWIKPEN) (70-30) 100 UNIT/ML PEN Take 15 units before dinner  . [DISCONTINUED] Tdap (BOOSTRIX)  5-2.5-18.5 LF-MCG/0.5 injection Inject 0.5 mLs into the muscle once.  . [DISCONTINUED] zoster vaccine live, PF, (ZOSTAVAX) 06269 UNT/0.65ML injection Inject 19,400 Units into the skin once.     No orders of the defined types were placed in this encounter.    Return in about 2 weeks (around 10/29/2013) for follow up diabetes.

## 2013-10-15 NOTE — Patient Instructions (Addendum)
I want you to start taking a new medication called Tradjenta.  It is taken once daily .  Continue to take the glipizide twice daily before breakfast and dinner.   You need to follow a low glycemic index diet and start exercising daily to help get your diabetes under control   Return in 2 weeks with a log of your blood sugars .  Check twice daily:  1) morning fasting (befofre breakfast )  2)  2 hrs post breakfast, lunch or dinner    The [facility] will help you understand how to eat  With their diabetes seminars This is  my version of a  "Low GI"  Diet:  It will still lower your blood sugars and allow you to lose 4 to 8  lbs  per month if you follow it carefully.  Your goal with exercise is a minimum of 30 minutes of aerobic exercise 5 days per week (Walking does not count once it becomes easy!)    All of the foods can be found at grocery stores and in bulk at [employer].  The Atkins protein bars and shakes are available in more varieties at Target, WalMart and [employer]'s [employer].      7 AM Breakfast:  Choose from the following:  Low carbohydrate Protein  Shakes (I recommend the [employer]Edge "Carb Control" shakes  Or the low carb shakes by Atkins.    2.5 carbs   Arnold's "Sandwhich Thin"toasted  w/ peanut butter (no jelly: about 20 net carbs  "Bagel Thin" with cream cheese and salmon: about 20 carbs   a scrambled egg/bacon/cheese burrito made with Mission's "carb balance" whole wheat tortilla  (about 10 net carbs )   Avoid cereal and bananas, oatmeal and cream of wheat and grits. They are loaded with carbohydrates!   10 AM: high protein snack  Protein bar by Atkins (the snack size, under 200 cal, usually < 6 net carbs).    A stick of cheese:  Around 1 carb,  100 cal     Dannon Light n Fit AustriaGreek Yogurt  (80 cal, 8 carbs)  Other so called "protein bars" and Greek yogurts tend to be loaded with carbohydrates.  Remember, in food advertising, the word "energy" is synonymous for "  carbohydrate."  Lunch:   A Sandwich using the bread choices listed, Can use any  Eggs,  lunchmeat, grilled meat or canned tuna), avocado, regular mayo/mustard  and cheese.  A Salad using blue cheese, ranch,  Goddess or vinagrette,  No croutons or "confetti" and no "candied nuts" but regular nuts OK.   No pretzels or chips.  Pickles and miniature sweet peppers are a good low carb alternative that provide a "crunch"  The bread is the only source of carbohydrate in a sandwich and  can be decreased by trying some of these alternatives to traditional loaf bread  Joseph's makes a pita bread and a flat bread that are 50 cal and 4 net carbs available at BJs and WalMart.  This can be toasted to use with hummous as well  Toufayan makes a low carb flatbread that's 100 cal and 9 net carbs available at [employer] and [employer]  Mission makes 2 sizes of  Low carb whole wheat tortilla  (The large one is 210 cal and 6 net carbs) Avoid "Low fat dressings, as well as Reyne DumasCatalina and [address] dressings They are loaded with sugar!   3 PM/ Mid day  Snack:  Consider  1  ounce of  almonds, walnuts, pistachios, pecans, peanuts,  Macadamia nuts or a nut medley.  Avoid "granola"; the dried cranberries and raisins are loaded with carbohydrates. Mixed nuts as long as there are no raisins,  cranberries or dried fruit.    Try the prosciutto/mozzarella cheese sticks by Fiorruci  In deli /backery section   High protein      6 PM  Dinner:     Meat/fowl/fish with a green salad, and either broccoli, cauliflower, green beans, spinach, brussel sprouts or  Lima beans. DO NOT BREAD THE PROTEIN!!      There is a low carb pasta by Dreamfield's that is acceptable and tastes great: only 5 digestible carbs/serving.( All grocery stores but BJs carry it )  Try Kai LevinsMichel Angelo's chicken piccata or chicken or eggplant parm over low carb pasta.(Lowes and BJs)   Clifton CustardAaron Sanchez's "Carnitas" (pulled pork, no sauce,  0 carbs) or his beef pot roast to  make a dinner burrito (at BJ's)  Pesto over low carb pasta (bj's sells a good quality pesto in the center refrigerated section of the deli   Try satueeing  Roosvelt HarpsBok Choy with mushroooms  Whole wheat pasta is still full of digestible carbs and  Not as low in glycemic index as Dreamfield's.   Takela Varden rice is still rice,  So skip the rice and noodles if you eat Congohinese or [place] (or at least limit to 1/2 cup)  9 PM snack :   Breyer's "low carb" fudgsicle or  ice cream bar (Carb Smart line), or  Weight Watcher's ice cream bar , or another "no sugar added" ice cream;  a serving of fresh berries/cherries with whipped cream   Cheese or DANNON'S LlGHT N FIT GREEK YOGURT  8 ounces of [place] unsweetened almond/cococunut milk    Avoid bananas, pineapple, grapes  and watermelon on a regular basis because they are high in sugar.  THINK OF THEM AS DESSERT  Remember that snack Substitutions should be less than 10 NET carbs per serving and meals < 20 carbs. Remember to subtract fiber grams to get the "net carbs."

## 2013-10-15 NOTE — Progress Notes (Signed)
Pre-visit discussion using our clinic review tool. No additional management support is needed unless otherwise documented below in the visit note.  

## 2013-10-16 ENCOUNTER — Encounter: Payer: Self-pay | Admitting: Internal Medicine

## 2013-10-16 NOTE — Assessment & Plan Note (Signed)
Secondary to dietary noncompliance propagated by meter malfunction .  New meter,  Low Gi diet started.  Insulin recommendations heeded but patient unable to handle commitment to insulin .  rx for onglyza given. Return in 2 weeks with log of sugars. [facility].

## 2013-10-24 ENCOUNTER — Encounter: Payer: Self-pay | Admitting: Internal Medicine

## 2013-10-25 ENCOUNTER — Other Ambulatory Visit: Payer: Self-pay | Admitting: Internal Medicine

## 2013-10-25 NOTE — Telephone Encounter (Signed)
Last refill 5.22.15, last OV 6.22.15, future OV 7.10.15.  Please advise refill.

## 2013-10-26 NOTE — Telephone Encounter (Signed)
Ok to refill,  printed rx  

## 2013-10-29 NOTE — Telephone Encounter (Signed)
Refill faxed

## 2013-10-30 ENCOUNTER — Encounter: Payer: Self-pay | Admitting: Internal Medicine

## 2013-11-02 ENCOUNTER — Ambulatory Visit: Payer: 59 | Admitting: Internal Medicine

## 2013-11-09 ENCOUNTER — Ambulatory Visit (INDEPENDENT_AMBULATORY_CARE_PROVIDER_SITE_OTHER): Payer: 59 | Admitting: Internal Medicine

## 2013-11-09 ENCOUNTER — Encounter: Payer: Self-pay | Admitting: Internal Medicine

## 2013-11-09 VITALS — BP 148/68 | HR 75 | Temp 97.8°F | Resp 18 | Ht 63.0 in | Wt 193.0 lb

## 2013-11-09 DIAGNOSIS — E1165 Type 2 diabetes mellitus with hyperglycemia: Principal | ICD-10-CM

## 2013-11-09 DIAGNOSIS — E1129 Type 2 diabetes mellitus with other diabetic kidney complication: Secondary | ICD-10-CM

## 2013-11-09 NOTE — Progress Notes (Signed)
Pre-visit discussion using our clinic review tool. No additional management support is needed unless otherwise documented below in the visit note.  

## 2013-11-09 NOTE — Progress Notes (Signed)
Patient ID: [name], female   DOB: [birth year], 78 y.o.   MRN: [id number] Patient Active Problem List   Diagnosis Date Noted  . Arm pain, left 09/23/2013  . Back pain of thoracolumbar region 09/23/2013  . Visit for preventive health examination 08/18/2013  . Laceration of ear, right, simple 08/18/2013  . Obesity, unspecified 07/07/2013  . Depression with anxiety 06/26/2013  . Hypomagnesemia 05/20/2013  . Acute renal failure 05/17/2013  . Viral URI 05/13/2013  . Hemorrhoids 11/06/2012  . Hidradenitis suppurativa 10/05/2012  . Unspecified hypothyroidism 07/24/2012  . Cognitive changes 01/18/2012  . Back pain, thoracic 11/27/2011  . Venous insufficiency of leg 11/24/2011  . Type II or unspecified type diabetes mellitus with renal manifestations, uncontrolled 10/03/2011  . Anemia of chronic renal failure 10/03/2011  . Chest pain with normal coronary angiography 04/21/2011  . Screening for colon cancer 04/12/2011  . Screening for breast cancer 04/12/2011  . Hypertension 04/12/2011  . Hyperlipidemia LDL goal < 100 04/12/2011  . Screening for osteoporosis 04/12/2011    Subjective:  CC:   Chief Complaint  Patient presents with  . Follow-up  . Diabetes    HPI:   [name] is a 78 y.o. female who presents for 2 week follow up on uncontrolled DM   At last visit patient was instructed on how to use insulin but became too  emotionally distressed to follow through with using insulin and was prescribed tradjenta.  She returns today for follow up but did not bring her blood sugars with her.  She recalls that her Her fastings have been running 127 ,  161 and at other times during the day around 240 to 250 ,  180 right before a meal.  Lab Results  Component Value Date   HGBA1C 8.5* 08/16/2013    She picked up the 70/30 flex pen that had been sent to her pharmacy but has only given herself one dose      Past Medical History  Diagnosis Date  . Diabetes mellitus   . GERD  (gastroesophageal reflux disease)   . Hypertension   . Anemia   . Arthritis   . Hemorrhoids     Past Surgical History  Procedure Laterality Date  . Cholecystectomy    . Abdominal hysterectomy    . Back surgery    . Arthroplasty w/ arthroscopy medial / lateral compartment knee  Feb 2011    [name]  . Hemorrhoid surgery  12/19/12       The following portions of the patient's history were reviewed and updated as appropriate: Allergies, current medications, and problem list.    Review of Systems:   Patient denies headache, fevers, malaise, unintentional weight loss, skin rash, eye pain, sinus congestion and sinus pain, sore throat, dysphagia,  hemoptysis , cough, dyspnea, wheezing, chest pain, palpitations, orthopnea, edema, abdominal pain, nausea, melena, diarrhea, constipation, flank pain, dysuria, hematuria, urinary  Frequency, nocturia, numbness, tingling, seizures,  Focal weakness, Loss of consciousness,  Tremor, insomnia, depression, anxiety, and suicidal ideation.     History   Social History  . Marital Status: Single    Spouse Name: N/A    Number of Children: N/A  . Years of Education: N/A   Occupational History  . Not on file.   Social History Main Topics  . Smoking status: Former Smoker -- 30 years    Types: Cigarettes    Quit date: 09/28/2001  . Smokeless tobacco: Current User    Types: Snuff  . Alcohol Use: No  .  Drug Use: No  . Sexual Activity: Not Currently   Other Topics Concern  . Not on file   Social History Narrative  . No narrative on file    Objective:  Filed Vitals:   11/09/13 1626  BP: 148/68  Pulse: 75  Temp: 97.8 F (36.6 C)  Resp: 18     General appearance: alert, cooperative and appears stated age Ears: normal TM's and external ear canals both ears Throat: lips, mucosa, and tongue normal; teeth and gums normal Neck: no adenopathy, no carotid bruit, supple, symmetrical, trachea midline and thyroid not enlarged, symmetric, no  tenderness/mass/nodules Back: symmetric, no curvature. ROM normal. No CVA tenderness. Lungs: clear to auscultation bilaterally Heart: regular rate and rhythm, S1, S2 normal, no murmur, click, rub or gallop Abdomen: soft, non-tender; bowel sounds normal; no masses,  no organomegaly Pulses: 2+ and symmetric Skin: Skin color, texture, turgor normal. No rashes or lesions Lymph nodes: Cervical, supraclavicular, and axillary nodes normal.  Assessment and Plan:  Type II or unspecified type diabetes mellitus with renal manifestations, uncontrolled BS remain uncontrolled on oral medications.  She was again given a tutorial on proper insulin administration and feels comfortable using the flex pen twice daily . Will start with 15 units twice daily,  Dc metformin given CR < 50 ml/min and continue tradjenta and glipizide for now.   A total of 25 minutes was spent with patient more than half of which was spent in counseling,  and coordination of care.   Updated Medication List Outpatient Encounter Prescriptions as of 11/09/2013  Medication Sig  . ALPRAZolam (XANAX) 0.5 MG tablet TAKE ONE TABLET TWICE DAILY AS NEEDED FOR ANXIETY  . atenolol (TENORMIN) 100 MG tablet TAKE 1 TABLET BY MOUTH TWICE A DAY.  Marland Kitchen Blood Glucose Monitoring Suppl (ONE TOUCH ULTRA SYSTEM KIT) W/DEVICE KIT 1 kit by Does not apply route once.  . citalopram (CELEXA) 10 MG tablet 1/2 tablet daily for the first week, then full tablet  . cloNIDine (CATAPRES) 0.1 MG tablet 1 tablet in am and 2 in the PM  . CRESTOR 20 MG tablet TAKE ONE (1) TABLET EACH DAY  . furosemide (LASIX) 40 MG tablet Take 1 tablet by mouth daily.  Marland Kitchen glipiZIDE (GLUCOTROL) 10 MG tablet Take 1 tablet (10 mg total) by mouth 2 (two) times daily before a meal.  . glucose blood (ONE TOUCH ULTRA TEST) test strip Check sugar twice daily Dx 250.42  . lactulose (CHRONULAC) 10 GM/15ML solution Take 45 mLs (30 g total) by mouth daily as needed for mild constipation. For  constipation, not more than twice weekly  . linagliptin (TRADJENTA) 5 MG TABS tablet Take 1 tablet (5 mg total) by mouth daily.  Marland Kitchen NEXIUM 40 MG capsule TAKE ONE (1) CAPSULE EACH DAY BEFORE BREAKFAST  . spironolactone (ALDACTONE) 50 MG tablet TAKE ONE (1) TABLET EACH DAY  . ULORIC 40 MG tablet TAKE ONE (1) TABLET EACH DAY  . valsartan (DIOVAN) 320 MG tablet TAKE ONE (1) TABLET EACH DAY FOR BLOOD PRESSURE  . Insulin Isophane & Regular Human (HUMULIN 70/30 KWIKPEN) (70-30) 100 UNIT/ML PEN Take 15 units before dinner     No orders of the defined types were placed in this encounter.    No Follow-up on file.

## 2013-11-09 NOTE — Patient Instructions (Signed)
I want you to start giving yourself 15 units of insulin with the [facility] pen 2 times daily,  About 20 minutes before  breakfast and then again before supper   I want you to check your sugars 2 hours after breakfast and after dinner   Do not resume the metformin   continue the glipizide before meals  .    Continue the tradjenta .   Drop off blood sugars in one week so I can adjust your insulin   I will see you in 2 weeks

## 2013-11-11 MED ORDER — INSULIN ISOPHANE & REGULAR (HUMAN 70-30)100 UNIT/ML KWIKPEN: PEN_INJECTOR | SUBCUTANEOUS | Status: DC

## 2013-11-11 NOTE — Assessment & Plan Note (Addendum)
BS remain uncontrolled on oral medications.  She was again given a tutorial on proper insulin administration and feels comfortable using the flex pen twice daily . Will start with 15 units twice daily,  Dc metformin given CR < 50 ml/min and continue tradjenta and glipizide for now.

## 2013-11-15 ENCOUNTER — Other Ambulatory Visit: Payer: Self-pay | Admitting: Internal Medicine

## 2013-11-15 DIAGNOSIS — K5900 Constipation, unspecified: Secondary | ICD-10-CM

## 2013-11-15 DIAGNOSIS — I10 Essential (primary) hypertension: Secondary | ICD-10-CM

## 2013-11-15 NOTE — Telephone Encounter (Signed)
Ok refill? 

## 2013-11-16 ENCOUNTER — Encounter: Payer: Self-pay | Admitting: *Deleted

## 2013-11-16 NOTE — Progress Notes (Signed)
Chart reviewed for DM bundle.  Last OV 11/09/13 Appt sch 11/28/13 for follow up

## 2013-11-19 NOTE — Telephone Encounter (Signed)
Ok to refill,  Refill sent  

## 2013-11-28 ENCOUNTER — Ambulatory Visit: Payer: 59 | Admitting: Internal Medicine

## 2013-11-29 ENCOUNTER — Other Ambulatory Visit: Payer: Self-pay | Admitting: Internal Medicine

## 2013-12-10 ENCOUNTER — Telehealth: Payer: Self-pay | Admitting: Internal Medicine

## 2013-12-10 NOTE — Telephone Encounter (Signed)
Patient Information:  [name] Name: Milayna  Phone: [phone]  Patient: Ronald LoboCheely, Rebbecca  Gender: Female  DOB: 13-Jul-1935  Age: 78 Years  PCP: Duncan Dullullo, Teresa (Adults only)  Office Follow Up:  Does the office need to follow up with this patient?: Yes  Instructions For The Office: Patient needs further instructions.  RN Note:  Patient would like to know what Dr. Darrick Huntsmanullo wants her to do regarding her blood sugar levels. Please contact patient to give further instructions.  Symptoms  Reason For Call & Symptoms: Increased blood sugar - 344. Slurred speech at this time. Also reports lightheadedness and dizziness at this time. Increased urination and increased thirst.  Reviewed Health History In EMR: Yes  Reviewed Medications In EMR: Yes  Reviewed Allergies In EMR: Yes  Reviewed Surgeries / Procedures: Yes  Date of Onset of Symptoms: 12/10/2013  Treatments Tried: started Novolin 70/30 yesterday  Treatments Tried Worked: No  Guideline(s) Used:  Diabetes - High Blood Sugar  Disposition Per Guideline:   Go to ED Now  Reason For Disposition Reached:   Blood glucose > 240 mg/dl (13 mmol/l) and rapid breathing  Advice Given:  N/A  Patient Refused Recommendation:  Patient Refused Care Advice  Would prefer to get instructions from Dr. Darrick Huntsmanullo.

## 2013-12-10 NOTE — Telephone Encounter (Signed)
Tell her to stop eating pancakes.  They are not for diabetics , even with sugar free syrup .  Does she need a refresher course in diet? [facility] has a great course for diabetics

## 2013-12-10 NOTE — Telephone Encounter (Signed)
Patient ate Pan Cakes at 8 am and , patient ate cracker with peanut butter not sure about time CBG at 12 noon  = 344, patient stated that she is drinking a lot of water trying to get CBG down. Patient voice is not slurred at this time patient normal voice noted. Patient did start  novolog 70/30, 15 units on 12/09/13.

## 2013-12-11 NOTE — Telephone Encounter (Signed)
Tried to call patient mail box full will return call .

## 2013-12-11 NOTE — Telephone Encounter (Signed)
Patient does not want to take classes patient CBG today at 11 pm =242 but has eaten saltine cracker for lunch. Advised patient this is a starch and will increase CBG.

## 2013-12-21 ENCOUNTER — Other Ambulatory Visit: Payer: Self-pay | Admitting: Internal Medicine

## 2013-12-26 ENCOUNTER — Other Ambulatory Visit: Payer: Self-pay | Admitting: Internal Medicine

## 2014-01-02 ENCOUNTER — Ambulatory Visit: Payer: PRIVATE HEALTH INSURANCE | Admitting: Internal Medicine

## 2014-01-04 ENCOUNTER — Telehealth: Payer: Self-pay | Admitting: *Deleted

## 2014-01-04 NOTE — Telephone Encounter (Signed)
She will need to have an office visit that she acutlaly shows up for to document need for home health

## 2014-01-04 NOTE — Telephone Encounter (Signed)
pts daughter called requesting order for home health to evaluate and treat for medication management and nutrition.  Please advise

## 2014-01-05 NOTE — Progress Notes (Signed)
Patient ID: [name], female   DOB: 11/01/1935, 78 y.o.   MRN: [id number]   Patient failed to appear for her DM followup

## 2014-01-05 NOTE — Assessment & Plan Note (Signed)
Patient no showed for appt.

## 2014-01-06 ENCOUNTER — Emergency Department: Payer: Self-pay | Admitting: Emergency Medicine

## 2014-01-06 LAB — BASIC METABOLIC PANEL
Anion Gap: 10 (ref 7–16)
BUN: 50 mg/dL — ABNORMAL HIGH (ref 7–18)
CALCIUM: 9.8 mg/dL (ref 8.5–10.1)
CHLORIDE: 100 mmol/L (ref 98–107)
Co2: 22 mmol/L (ref 21–32)
Creatinine: 2.31 mg/dL — ABNORMAL HIGH (ref 0.60–1.30)
EGFR (Non-African Amer.): 20 — ABNORMAL LOW
GFR CALC AF AMER: 23 — AB
GLUCOSE: 193 mg/dL — AB (ref 65–99)
Osmolality: 283 (ref 275–301)
Potassium: 5.4 mmol/L — ABNORMAL HIGH (ref 3.5–5.1)
SODIUM: 132 mmol/L — AB (ref 136–145)

## 2014-01-06 LAB — CBC WITH DIFFERENTIAL/PLATELET
BASOS ABS: 0 10*3/uL (ref 0.0–0.1)
BASOS PCT: 0.8 %
EOS PCT: 0.7 %
Eosinophil #: 0 10*3/uL (ref 0.0–0.7)
HCT: 38.1 % (ref 35.0–47.0)
HGB: 12.9 g/dL (ref 12.0–16.0)
LYMPHS PCT: 36 %
Lymphocyte #: 1.7 10*3/uL (ref 1.0–3.6)
MCH: 30.1 pg (ref 26.0–34.0)
MCHC: 33.7 g/dL (ref 32.0–36.0)
MCV: 89 fL (ref 80–100)
Monocyte #: 0.4 x10 3/mm (ref 0.2–0.9)
Monocyte %: 8.8 %
Neutrophil #: 2.5 10*3/uL (ref 1.4–6.5)
Neutrophil %: 53.7 %
Platelet: 247 10*3/uL (ref 150–440)
RBC: 4.28 10*6/uL (ref 3.80–5.20)
RDW: 13.6 % (ref 11.5–14.5)
WBC: 4.7 10*3/uL (ref 3.6–11.0)

## 2014-01-06 LAB — URINALYSIS, COMPLETE
Bilirubin,UR: NEGATIVE
Glucose,UR: 50 mg/dL (ref 0–75)
Ketone: NEGATIVE
Nitrite: NEGATIVE
PH: 5 (ref 4.5–8.0)
Protein: 100
Specific Gravity: 1.006 (ref 1.003–1.030)
Squamous Epithelial: 5
WBC UR: 19 /HPF (ref 0–5)

## 2014-01-07 NOTE — Telephone Encounter (Signed)
Patient has an appointment scheduled for 01/16/14

## 2014-01-16 ENCOUNTER — Encounter: Payer: Self-pay | Admitting: Internal Medicine

## 2014-01-16 ENCOUNTER — Ambulatory Visit (INDEPENDENT_AMBULATORY_CARE_PROVIDER_SITE_OTHER): Payer: PRIVATE HEALTH INSURANCE | Admitting: Internal Medicine

## 2014-01-16 VITALS — BP 120/50 | HR 62 | Temp 97.5°F | Resp 16 | Ht 63.0 in | Wt 191.0 lb

## 2014-01-16 DIAGNOSIS — E1165 Type 2 diabetes mellitus with hyperglycemia: Principal | ICD-10-CM

## 2014-01-16 DIAGNOSIS — E871 Hypo-osmolality and hyponatremia: Secondary | ICD-10-CM

## 2014-01-16 DIAGNOSIS — R4189 Other symptoms and signs involving cognitive functions and awareness: Secondary | ICD-10-CM

## 2014-01-16 DIAGNOSIS — Z23 Encounter for immunization: Secondary | ICD-10-CM

## 2014-01-16 DIAGNOSIS — E1129 Type 2 diabetes mellitus with other diabetic kidney complication: Secondary | ICD-10-CM

## 2014-01-16 LAB — COMPREHENSIVE METABOLIC PANEL
ALT: 21 U/L (ref 0–35)
AST: 19 U/L (ref 0–37)
Albumin: 4.1 g/dL (ref 3.5–5.2)
Alkaline Phosphatase: 152 U/L — ABNORMAL HIGH (ref 39–117)
BILIRUBIN TOTAL: 0.3 mg/dL (ref 0.2–1.2)
BUN: 63 mg/dL — ABNORMAL HIGH (ref 6–23)
CALCIUM: 9.7 mg/dL (ref 8.4–10.5)
CHLORIDE: 88 meq/L — AB (ref 96–112)
CO2: 26 meq/L (ref 19–32)
Creatinine, Ser: 3.7 mg/dL — ABNORMAL HIGH (ref 0.4–1.2)
GFR: 15.52 mL/min — AB (ref >60.00)
Glucose, Bld: 154 mg/dL — ABNORMAL HIGH (ref 70–99)
Potassium: 5.8 mEq/L — ABNORMAL HIGH (ref 3.5–5.1)
SODIUM: 122 meq/L — AB (ref 135–145)
TOTAL PROTEIN: 8 g/dL (ref 6.0–8.3)

## 2014-01-16 LAB — LIPID PANEL
CHOLESTEROL: 120 mg/dL (ref 0–200)
HDL: 35.5 mg/dL — ABNORMAL LOW (ref >39.00)
LDL Cholesterol: 59 mg/dL (ref 0–99)
NonHDL: 84.5
Total CHOL/HDL Ratio: 3
Triglycerides: 128 mg/dL (ref 0.0–149.0)
VLDL: 25.6 mg/dL (ref 0.0–40.0)

## 2014-01-16 LAB — HM DIABETES EYE EXAM

## 2014-01-16 LAB — HEMOGLOBIN A1C: HEMOGLOBIN A1C: 10.7 % — AB (ref 4.6–6.5)
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
## 2014-01-16 NOTE — Patient Instructions (Addendum)
Please increase your dose of insulin before dinner to 20 units  Continue to check  Sugar  When  You wake up  And again 2 hours after dinner   Do this for a week  And write them on the sheet and return it to me   If the morning blood sugar drops below 100,  Reduce the night time dose to 18 unit     We will see if we can arrange home PT for your back

## 2014-01-16 NOTE — Progress Notes (Signed)
Pre-visit discussion using our clinic review tool. No additional management support is needed unless otherwise documented below in the visit note.  

## 2014-01-19 ENCOUNTER — Encounter: Payer: Self-pay | Admitting: Internal Medicine

## 2014-01-19 DIAGNOSIS — E871 Hypo-osmolality and hyponatremia: Secondary | ICD-10-CM | POA: Insufficient documentation

## 2014-01-19 MED ORDER — INSULIN ISOPHANE & REGULAR (HUMAN 70-30)100 UNIT/ML KWIKPEN: PEN_INJECTOR | SUBCUTANEOUS | Status: DC

## 2014-01-19 NOTE — Assessment & Plan Note (Addendum)
Patient no showed for appt two weeks ago. Her BS remain uncontrolled on oral medications.  She was instructed to give herself  15 units twice daily,  But has only been giving herself once dose daily before dinner.  I am increasing the dose to 20 units and adding 15 unitsin the am. .  Metformin  was d/c'd at last visit given  CR < 50 ml/min.   continue tradjenta and glipizide for now.
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
## 2014-01-19 NOTE — Assessment & Plan Note (Signed)
Severe, with hyperkalemia and hypochloremia..  Will have her return on [place] with repeat BMET

## 2014-01-19 NOTE — Addendum Note (Signed)
Addended by: Sherlene Shams on: 01/19/2014 08:22 PM   Modules accepted: Orders

## 2014-01-22 ENCOUNTER — Telehealth: Payer: Self-pay | Admitting: Internal Medicine

## 2014-01-22 ENCOUNTER — Other Ambulatory Visit (INDEPENDENT_AMBULATORY_CARE_PROVIDER_SITE_OTHER): Payer: PRIVATE HEALTH INSURANCE

## 2014-01-22 DIAGNOSIS — Z0279 Encounter for issue of other medical certificate: Secondary | ICD-10-CM

## 2014-01-22 DIAGNOSIS — E871 Hypo-osmolality and hyponatremia: Secondary | ICD-10-CM

## 2014-01-22 LAB — BASIC METABOLIC PANEL
BUN: 67 mg/dL — ABNORMAL HIGH (ref 6–23)
CHLORIDE: 89 meq/L — AB (ref 96–112)
CO2: 27 mEq/L (ref 19–32)
CREATININE: 3.3 mg/dL — AB (ref 0.4–1.2)
Calcium: 10.1 mg/dL (ref 8.4–10.5)
GFR: 17.25 mL/min — AB (ref >60.00)
Glucose, Bld: 135 mg/dL — ABNORMAL HIGH (ref 70–99)
POTASSIUM: 4.9 meq/L (ref 3.5–5.1)
SODIUM: 122 meq/L — AB (ref 135–145)

## 2014-01-22 NOTE — Telephone Encounter (Signed)
Patient needs letter for [name] housing authority that she has missed appointment due to medical reasons. Patient daughter stated she needs this 01/29/14 please advise.

## 2014-01-22 NOTE — Telephone Encounter (Signed)
Letter written,  Please charge for form.  $20

## 2014-01-23 NOTE — Telephone Encounter (Signed)
Charge sheet filled awaiting signature on letter.

## 2014-01-23 NOTE — Telephone Encounter (Signed)
Patient DPR notified letter ready for pickup, placed at front desk. Placed copy for billing.

## 2014-01-24 ENCOUNTER — Telehealth: Payer: Self-pay | Admitting: *Deleted

## 2014-01-24 DIAGNOSIS — N179 Acute kidney failure, unspecified: Secondary | ICD-10-CM

## 2014-01-24 NOTE — Telephone Encounter (Signed)
Pt is coming in tomorrow what labs and dx?  

## 2014-01-25 ENCOUNTER — Other Ambulatory Visit (INDEPENDENT_AMBULATORY_CARE_PROVIDER_SITE_OTHER): Payer: PRIVATE HEALTH INSURANCE

## 2014-01-25 ENCOUNTER — Other Ambulatory Visit: Payer: 59

## 2014-01-25 ENCOUNTER — Other Ambulatory Visit: Payer: Self-pay | Admitting: Internal Medicine

## 2014-01-25 DIAGNOSIS — N179 Acute kidney failure, unspecified: Secondary | ICD-10-CM

## 2014-01-25 LAB — BASIC METABOLIC PANEL
BUN: 55 mg/dL — ABNORMAL HIGH (ref 6–23)
CO2: 23 mEq/L (ref 19–32)
Calcium: 9.6 mg/dL (ref 8.4–10.5)
Chloride: 92 mEq/L — ABNORMAL LOW (ref 96–112)
Creatinine, Ser: 2.5 mg/dL — ABNORMAL HIGH (ref 0.4–1.2)
GFR: 24.55 mL/min — AB (ref >60.00)
Glucose, Bld: 278 mg/dL — ABNORMAL HIGH (ref 70–99)
Potassium: 4.9 mEq/L (ref 3.5–5.1)
SODIUM: 123 meq/L — AB (ref 135–145)

## 2014-01-31 ENCOUNTER — Telehealth: Payer: Self-pay | Admitting: Internal Medicine

## 2014-01-31 ENCOUNTER — Telehealth: Payer: Self-pay | Admitting: *Deleted

## 2014-01-31 DIAGNOSIS — N179 Acute kidney failure, unspecified: Secondary | ICD-10-CM

## 2014-01-31 NOTE — Telephone Encounter (Signed)
French Anaracy from [facility] called saying the pt said she has limited ROM in her left upper arm and can't walk as far. She's wondering if she can get a PT Eval order sent to her.  Please call French Anaracy at: [phone] Thank you.

## 2014-01-31 NOTE — Telephone Encounter (Signed)
Pt is coming in tomorrow what labs and dx?  

## 2014-01-31 NOTE — Telephone Encounter (Signed)
Order printed

## 2014-02-01 ENCOUNTER — Other Ambulatory Visit (INDEPENDENT_AMBULATORY_CARE_PROVIDER_SITE_OTHER): Payer: PRIVATE HEALTH INSURANCE

## 2014-02-01 DIAGNOSIS — N179 Acute kidney failure, unspecified: Secondary | ICD-10-CM

## 2014-02-01 LAB — BASIC METABOLIC PANEL
BUN: 35 mg/dL — ABNORMAL HIGH (ref 6–23)
CO2: 24 mEq/L (ref 19–32)
Calcium: 9.7 mg/dL (ref 8.4–10.5)
Chloride: 102 mEq/L (ref 96–112)
Creatinine, Ser: 2.2 mg/dL — ABNORMAL HIGH (ref 0.4–1.2)
GFR: 27.78 mL/min — ABNORMAL LOW (ref >60.00)
Glucose, Bld: 201 mg/dL — ABNORMAL HIGH (ref 70–99)
Potassium: 4.9 mEq/L (ref 3.5–5.1)
Sodium: 129 mEq/L — ABNORMAL LOW (ref 135–145)

## 2014-02-01 NOTE — Telephone Encounter (Deleted)
Verbal order given as requested by French Anaracy.  Written order filed.

## 2014-02-01 NOTE — Telephone Encounter (Signed)
Gave verbal order as requested by French Anaracy

## 2014-02-02 ENCOUNTER — Other Ambulatory Visit: Payer: Self-pay | Admitting: Internal Medicine

## 2014-02-04 ENCOUNTER — Telehealth: Payer: Self-pay | Admitting: *Deleted

## 2014-02-04 ENCOUNTER — Telehealth: Payer: Self-pay | Admitting: Internal Medicine

## 2014-02-04 MED ORDER — ALPRAZOLAM 0.5 MG PO TABS: ORAL_TABLET | ORAL | Status: DC

## 2014-02-04 MED ORDER — FLUCONAZOLE 150 MG PO TABS: 150.0000 mg | ORAL_TABLET | Freq: Every day | ORAL | Status: DC

## 2014-02-04 NOTE — Telephone Encounter (Signed)
Patient is having vaginal itching and burning and ask if she could have something for yeast infection stated nephrologist prescribed ABX for kidney infection and now she has itching and rash on vaginal area.

## 2014-02-04 NOTE — Telephone Encounter (Signed)
FLUCONAZOLE SENT TO [facility]

## 2014-02-04 NOTE — Telephone Encounter (Signed)
The patient called back and asked for a return call at your convenience

## 2014-02-04 NOTE — Telephone Encounter (Signed)
Pt called states she dropped her medication in the sink and they got wet,  She is requesting another Xanax refill.  Last refill on 10.2.15.  Please advise

## 2014-02-04 NOTE — Telephone Encounter (Signed)
Her nephrologist told her to resume lasix bid last week, per office note from 10/7 , which contradicts my directive based on the 10/9 labs.  Please find out if she has been taking the furosemide since 10/7  bc we'll need to repeat BMEt THIS week

## 2014-02-04 NOTE — Telephone Encounter (Signed)
Reprinted alprazolam per verbal order due to patient spilling water all over medication.

## 2014-02-04 NOTE — Telephone Encounter (Signed)
Patient notified and voiced understanding.

## 2014-02-04 NOTE — Telephone Encounter (Signed)
Patient took a total of 80 mg on 01/31/14 and has been taking each day per patient DPR. Could not reach patient,

## 2014-02-05 ENCOUNTER — Telehealth: Payer: Self-pay | Admitting: *Deleted

## 2014-02-05 DIAGNOSIS — N184 Chronic kidney disease, stage 4 (severe): Secondary | ICD-10-CM

## 2014-02-05 NOTE — Telephone Encounter (Signed)
Competed by Henrene PastorKathy Davis, LPN

## 2014-02-05 NOTE — Telephone Encounter (Signed)
Pt is coming in tomorrow what labs and dx?  

## 2014-02-06 ENCOUNTER — Other Ambulatory Visit: Payer: 59

## 2014-02-06 ENCOUNTER — Other Ambulatory Visit (INDEPENDENT_AMBULATORY_CARE_PROVIDER_SITE_OTHER): Payer: PRIVATE HEALTH INSURANCE

## 2014-02-06 DIAGNOSIS — N184 Chronic kidney disease, stage 4 (severe): Secondary | ICD-10-CM

## 2014-02-06 LAB — BASIC METABOLIC PANEL
BUN: 40 mg/dL — AB (ref 6–23)
CO2: 23 mEq/L (ref 19–32)
CREATININE: 2.3 mg/dL — AB (ref 0.4–1.2)
Calcium: 9.9 mg/dL (ref 8.4–10.5)
Chloride: 102 mEq/L (ref 96–112)
GFR: 26.79 mL/min — AB (ref >60.00)
Glucose, Bld: 110 mg/dL — ABNORMAL HIGH (ref 70–99)
Potassium: 4.6 mEq/L (ref 3.5–5.1)
Sodium: 132 mEq/L — ABNORMAL LOW (ref 135–145)

## 2014-02-07 ENCOUNTER — Telehealth: Payer: Self-pay | Admitting: Internal Medicine

## 2014-02-07 ENCOUNTER — Other Ambulatory Visit: Payer: Self-pay | Admitting: *Deleted

## 2014-02-07 ENCOUNTER — Other Ambulatory Visit: Payer: Self-pay | Admitting: Internal Medicine

## 2014-02-07 MED ORDER — GLUCOSE BLOOD VI STRP: ORAL_STRIP | Status: DC

## 2014-02-07 NOTE — Telephone Encounter (Signed)
glucose blood (ONE TOUCH ULTRA TEST) test strip

## 2014-02-07 NOTE — Telephone Encounter (Signed)
Spoke with pts daughter, advised her as per our records there should be 2 more refills at the pharmacy.

## 2014-02-08 ENCOUNTER — Encounter: Payer: Self-pay | Admitting: *Deleted

## 2014-02-15 ENCOUNTER — Telehealth: Payer: Self-pay | Admitting: *Deleted

## 2014-02-15 NOTE — Telephone Encounter (Signed)
[name] with [name] called, asking if ok for pt to receive OT 2x weekly x 4 weeks?

## 2014-02-15 NOTE — Telephone Encounter (Signed)
Yes continue PT 2 x week x 4 weeks

## 2014-02-15 NOTE — Telephone Encounter (Signed)
Left message, notifying [name].

## 2014-02-22 ENCOUNTER — Other Ambulatory Visit: Payer: Self-pay | Admitting: Internal Medicine

## 2014-02-25 ENCOUNTER — Encounter: Payer: Self-pay | Admitting: Internal Medicine

## 2014-03-01 ENCOUNTER — Other Ambulatory Visit: Payer: Self-pay | Admitting: Internal Medicine

## 2014-03-12 ENCOUNTER — Other Ambulatory Visit: Payer: Self-pay | Admitting: *Deleted

## 2014-03-12 DIAGNOSIS — E1165 Type 2 diabetes mellitus with hyperglycemia: Principal | ICD-10-CM

## 2014-03-12 DIAGNOSIS — IMO0002 Reserved for concepts with insufficient information to code with codable children: Secondary | ICD-10-CM

## 2014-03-12 DIAGNOSIS — E1129 Type 2 diabetes mellitus with other diabetic kidney complication: Secondary | ICD-10-CM

## 2014-03-12 MED ORDER — INSULIN ISOPHANE & REGULAR (HUMAN 70-30)100 UNIT/ML KWIKPEN: PEN_INJECTOR | SUBCUTANEOUS | Status: DC

## 2014-03-15 ENCOUNTER — Telehealth: Payer: Self-pay | Admitting: Internal Medicine

## 2014-03-15 NOTE — Telephone Encounter (Signed)
Verbal order to continue Physical Therapy authorized.

## 2014-03-15 NOTE — Telephone Encounter (Signed)
Shreya notified.

## 2014-03-15 NOTE — Telephone Encounter (Signed)
Aram CandelaShreya, the Physical Therapist called needing a verbal order to continue with Ms. Kidd's PT for another two weeks. Please call Shreya to let her know. Shreya's ph# [phone] Thank you.

## 2014-03-15 NOTE — Telephone Encounter (Signed)
Ok

## 2014-04-10 ENCOUNTER — Telehealth (HOSPITAL_COMMUNITY): Payer: Self-pay | Admitting: *Deleted

## 2014-04-10 NOTE — Telephone Encounter (Signed)
Spoke with pt about her Advance Directives and she states that she does not have one and not interested of having informations about it. She states further that her children are not willing to discuss that topic with her. Recommended to discuss matter with her PCP.

## 2014-04-15 ENCOUNTER — Other Ambulatory Visit: Payer: Self-pay | Admitting: Internal Medicine

## 2014-04-23 ENCOUNTER — Other Ambulatory Visit: Payer: Self-pay | Admitting: Internal Medicine

## 2014-05-18 ENCOUNTER — Other Ambulatory Visit: Payer: Self-pay | Admitting: Internal Medicine

## 2014-05-29 ENCOUNTER — Other Ambulatory Visit: Payer: Self-pay | Admitting: Internal Medicine

## 2014-05-29 NOTE — Telephone Encounter (Signed)
Last OV 9.23.15.  Please advise refill 

## 2014-05-29 NOTE — Telephone Encounter (Signed)
Ok to refill,  Refill sent  

## 2014-06-12 ENCOUNTER — Other Ambulatory Visit: Payer: Self-pay | Admitting: Internal Medicine

## 2014-06-14 ENCOUNTER — Ambulatory Visit: Payer: Medicaid Other | Admitting: Internal Medicine

## 2014-06-17 ENCOUNTER — Ambulatory Visit: Payer: Medicaid Other | Admitting: Internal Medicine

## 2014-06-19 ENCOUNTER — Other Ambulatory Visit: Payer: Self-pay | Admitting: Internal Medicine

## 2014-07-01 ENCOUNTER — Ambulatory Visit (INDEPENDENT_AMBULATORY_CARE_PROVIDER_SITE_OTHER): Payer: Medicare Other | Admitting: Internal Medicine

## 2014-07-01 ENCOUNTER — Other Ambulatory Visit: Payer: Self-pay | Admitting: Internal Medicine

## 2014-07-01 ENCOUNTER — Encounter: Payer: Self-pay | Admitting: Internal Medicine

## 2014-07-01 ENCOUNTER — Telehealth: Payer: Self-pay | Admitting: Internal Medicine

## 2014-07-01 VITALS — BP 140/56 | HR 80 | Temp 97.4°F | Resp 16 | Ht 63.0 in | Wt 189.5 lb

## 2014-07-01 DIAGNOSIS — E1165 Type 2 diabetes mellitus with hyperglycemia: Secondary | ICD-10-CM

## 2014-07-01 DIAGNOSIS — I10 Essential (primary) hypertension: Secondary | ICD-10-CM | POA: Diagnosis not present

## 2014-07-01 DIAGNOSIS — E1129 Type 2 diabetes mellitus with other diabetic kidney complication: Secondary | ICD-10-CM

## 2014-07-01 DIAGNOSIS — R4189 Other symptoms and signs involving cognitive functions and awareness: Secondary | ICD-10-CM | POA: Diagnosis not present

## 2014-07-01 DIAGNOSIS — IMO0002 Reserved for concepts with insufficient information to code with codable children: Secondary | ICD-10-CM

## 2014-07-01 NOTE — Telephone Encounter (Signed)
Pt aware.

## 2014-07-01 NOTE — Telephone Encounter (Signed)
FYI

## 2014-07-01 NOTE — Telephone Encounter (Signed)
Patient Name: [name] DOB: April 20, 1936 Initial Comment [name] states she called this morning about bp being lower than average. Ate something and licked salt. Has appt today. 80/42, does not feel well. Nurse Assessment Nurse: Elijah Birkaldwell, RN, Lynda Date/Time (Eastern Time): 07/01/2014 11:06:51 AM Confirm and document reason for call. If symptomatic, describe symptoms. ---[name] states she called this morning about BP being lower than average. She ate something and licked salt to bring it up . Has appt today. BP 80/42, does not feel well, headache. Usually her BP runs high. Her blood sugar was 222 fasting. Worried about taking her BP medicine this am. Has the patient traveled out of the country within the last 30 days? ---Not Applicable Does the patient require triage? ---Yes Related visit to physician within the last 2 weeks? ---No Does the PT have any chronic conditions? (i.e. diabetes, asthma, etc.) ---Yes List chronic conditions. ---diabetes, on BP rx, anxiety, gout Guidelines Guideline Title Affirmed Question Affirmed Notes Low Blood Pressure [1] Systolic BP < 90 AND [2] NOT dizzy, lightheaded or weak Final Disposition User See Physician within 4 Hours (or PCP triage) Elijah Birkaldwell, RN, Lynda Comments [name] rechecked her BP with nurse on the phone & it had come up to 124/52 approx. She will not take her am BP rx as she is worried about her BP being lower than usual.

## 2014-07-01 NOTE — Patient Instructions (Addendum)
Your low blood pressure was probably from the clonidine.  Please contineu the atenolol, spironolactone and valsartan  You are overdue fore A1c and other bloodwork  Please return for fasting labs, and we will schedule an office visit to discuss your diabetes after thatn

## 2014-07-01 NOTE — Telephone Encounter (Signed)
She should not take her BP medications if her blood pressure is that low
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
## 2014-07-01 NOTE — Progress Notes (Signed)
Pre-visit discussion using our clinic review tool. No additional management support is needed unless otherwise documented below in the visit note.  

## 2014-07-03 NOTE — Assessment & Plan Note (Signed)
Patient 's last a1c was Sept 2015 and she has no showed for her follow up appt over 3 months ago.  [name]. Her BS remain uncontrolled on oral medications.  She was instructed to give herself  15 units twice daily,  But has only been giving herself once dose daily before dinner.  I am increasing the dose to 20 units and adding 15 unitsin the am. .  Metformin  was d/c'd at last visit given  CR < 50 ml/min.   continue tradjenta and glipizide for now.   Lab Results  Component Value Date   HGBA1C 10.7* 01/16/2014

## 2014-07-03 NOTE — Assessment & Plan Note (Signed)
She remains intellectually intact with regard to MMSe but is a poor historian .

## 2014-07-03 NOTE — Assessment & Plan Note (Signed)
Recent episode of hypotension appears to be due to use of clonidine .  Advised to resume atenolol, valsartan and furosemide/aldactone,

## 2014-07-09 ENCOUNTER — Other Ambulatory Visit: Payer: Self-pay | Admitting: Internal Medicine

## 2014-07-09 ENCOUNTER — Ambulatory Visit: Payer: Medicaid Other | Admitting: Internal Medicine

## 2014-07-09 NOTE — Telephone Encounter (Signed)
Last refill 2.17.16, last OV 3.7.16.  Please advise refill.

## 2014-07-10 ENCOUNTER — Other Ambulatory Visit: Payer: Medicare Other

## 2014-07-10 NOTE — Telephone Encounter (Signed)
Ok to refill,  printed rx  

## 2014-07-10 NOTE — Telephone Encounter (Signed)
rx faxed

## 2014-07-11 ENCOUNTER — Other Ambulatory Visit: Payer: Medicare Other

## 2014-07-17 ENCOUNTER — Other Ambulatory Visit (INDEPENDENT_AMBULATORY_CARE_PROVIDER_SITE_OTHER): Payer: Medicare Other

## 2014-07-17 DIAGNOSIS — E1165 Type 2 diabetes mellitus with hyperglycemia: Secondary | ICD-10-CM | POA: Diagnosis not present

## 2014-07-17 DIAGNOSIS — E1129 Type 2 diabetes mellitus with other diabetic kidney complication: Secondary | ICD-10-CM

## 2014-07-17 DIAGNOSIS — IMO0002 Reserved for concepts with insufficient information to code with codable children: Secondary | ICD-10-CM

## 2014-07-17 LAB — COMPREHENSIVE METABOLIC PANEL
ALT: 22 U/L (ref 0–35)
AST: 19 U/L (ref 0–37)
Albumin: 3.9 g/dL (ref 3.5–5.2)
Alkaline Phosphatase: 142 U/L — ABNORMAL HIGH (ref 39–117)
BILIRUBIN TOTAL: 0.3 mg/dL (ref 0.2–1.2)
BUN: 65 mg/dL — ABNORMAL HIGH (ref 6–23)
CO2: 24 mEq/L (ref 19–32)
Calcium: 9.7 mg/dL (ref 8.4–10.5)
Chloride: 101 mEq/L (ref 96–112)
Creatinine, Ser: 3.38 mg/dL — ABNORMAL HIGH (ref 0.40–1.20)
GFR: 16.99 mL/min — AB (ref >60.00)
GLUCOSE: 93 mg/dL (ref 70–99)
Potassium: 4.5 mEq/L (ref 3.5–5.1)
Sodium: 132 mEq/L — ABNORMAL LOW (ref 135–145)
TOTAL PROTEIN: 6.8 g/dL (ref 6.0–8.3)

## 2014-07-17 LAB — LIPID PANEL
Cholesterol: 114 mg/dL (ref 0–200)
HDL: 33.8 mg/dL — AB (ref >39.00)
LDL CALC: 60 mg/dL (ref 0–99)
NonHDL: 80.2
TRIGLYCERIDES: 101 mg/dL (ref 0.0–149.0)
Total CHOL/HDL Ratio: 3
VLDL: 20.2 mg/dL (ref 0.0–40.0)

## 2014-07-17 LAB — MICROALBUMIN / CREATININE URINE RATIO
Creatinine,U: 158.9 mg/dL
MICROALB UR: 11.2 mg/dL — AB (ref 0.0–1.9)
Microalb Creat Ratio: 7.1 mg/g (ref 0.0–30.0)

## 2014-07-17 LAB — HEMOGLOBIN A1C: Hgb A1c MFr Bld: 9.4 % — ABNORMAL HIGH (ref 4.6–6.5)

## 2014-07-25 ENCOUNTER — Telehealth: Payer: Self-pay

## 2014-07-25 ENCOUNTER — Telehealth: Payer: Self-pay | Admitting: Internal Medicine

## 2014-07-25 NOTE — Telephone Encounter (Signed)
FYI

## 2014-07-25 NOTE — Telephone Encounter (Signed)
The patient called and is hoping to discuss her blood work and her kidney function with the nurse.

## 2014-07-25 NOTE — Telephone Encounter (Signed)
Patient Name: [name] DOB: 28-Jan-1936 Initial Comment [name] states she is have on and off fevers and mouth symptoms. Stated she has been having kidney problems as well. Nurse Assessment Nurse: Charna Elizabethrumbull, RN, Cathy Date/Time (Eastern Time): 07/25/2014 2:30:38 PM Confirm and document reason for call. If symptomatic, describe symptoms. ---[name] states she developed fever yesterday (estimates that it is low grade) and left flank pain. Has the patient traveled out of the country within the last 30 days? ---No Does the patient require triage? ---Yes Related visit to physician within the last 2 weeks? ---Yes Does the PT have any chronic conditions? (i.e. diabetes, asthma, etc.) ---Yes List chronic conditions. ---Kidney problems, Heart problems, Stomach problems, high blood Pressure Guidelines Guideline Title Affirmed Question Affirmed Notes Flank Pain [1] Sudden onset of severe flank pain AND [2] age > 6660 Final Disposition User Go to ED Now Charna Elizabethrumbull, RN, Cathy [facility]

## 2014-07-26 NOTE — Telephone Encounter (Signed)
Called patient and discussed labs as requested patient voiced understanding.

## 2014-07-29 ENCOUNTER — Telehealth: Payer: Self-pay | Admitting: Internal Medicine

## 2014-07-29 NOTE — Telephone Encounter (Signed)
Patient Name: [name] DOB: [birth year] Initial Comment [name] states she is having chills today and not sure if it is her kidney. Nurse Assessment Nurse: Yetta BarreJones, RN, Miranda Date/Time (Eastern Time): 07/29/2014 2:53:20 PM Confirm and document reason for call. If symptomatic, describe symptoms. ---[name] states she has felt hot and having chills off and on since Friday. She has not been able to check her temp. She was referred to the ED on Thursday but has not been seen. Has the patient traveled out of the country within the last 30 days? ---Not Applicable Does the patient require triage? ---Yes Related visit to physician within the last 2 weeks? ---No Does the PT have any chronic conditions? (i.e. diabetes, asthma, etc.) ---Yes List chronic conditions. ---HTN, Diabetes, GERD, hx of spinal fusion, Allergies Guidelines Guideline Title Affirmed Question Affirmed Notes Fever Fever present > 3 days (72 hours) Final Disposition User See Physician within 24 Hours Jones, RN, Tamera PuntMiranda Comments She was seen by her back doctor who ordered PT. Appt scheduled for 1130 tomorrow with Naomie Deanarrie Doss.

## 2014-07-29 NOTE — Telephone Encounter (Signed)
FYI: Patient scheduled to be seen by Naomie Deanarrie Doss tomorrow, 07/30/14.

## 2014-07-30 ENCOUNTER — Encounter: Payer: Self-pay | Admitting: Nurse Practitioner

## 2014-07-30 ENCOUNTER — Ambulatory Visit (INDEPENDENT_AMBULATORY_CARE_PROVIDER_SITE_OTHER): Payer: Medicare Other | Admitting: Nurse Practitioner

## 2014-07-30 VITALS — BP 118/62 | HR 65 | Temp 97.4°F | Ht 63.0 in | Wt 188.8 lb

## 2014-07-30 DIAGNOSIS — M791 Myalgia, unspecified site: Secondary | ICD-10-CM

## 2014-07-30 DIAGNOSIS — R300 Dysuria: Secondary | ICD-10-CM | POA: Diagnosis not present

## 2014-07-30 LAB — POCT URINALYSIS DIPSTICK
Bilirubin, UA: NEGATIVE
GLUCOSE UA: NEGATIVE
KETONES UA: NEGATIVE
Nitrite, UA: NEGATIVE
Protein, UA: 100
SPEC GRAV UA: 1.015
UROBILINOGEN UA: 0.2
pH, UA: 5.5

## 2014-07-30 NOTE — Progress Notes (Signed)
Pre visit review using our clinic review tool, if applicable. No additional management support is needed unless otherwise documented below in the visit note. 

## 2014-07-30 NOTE — Patient Instructions (Signed)
Please continue to follow up with Dr. Cherylann RatelLateef  Drink plenty of water.   If you find you are not urinating you will need to go to the Emergency room.

## 2014-07-30 NOTE — Progress Notes (Signed)
   Subjective:    Patient ID: [name] LoboMertis [name], female    DOB: Dec 15, 1935, 79 y.o.   MRN: [id number]  HPI  [name] is a 79 yo female with a CC of generalized body aches x 1 week. She is accompanied by her grandson today.   1) Body aches, fever intermittently subjective  BS100's - upper 100's  Dr. Cherylann Ratellateef next week 4/12 for kidneys Back hurts- Saw Dr. Darrick Huntsmanullo recently and saw Dr. Thayer Ohmhris last week on Friday for back pain. Dysuria and urgency today.   She feels bad today.   Review of Systems  Constitutional: Positive for chills and fatigue.  Genitourinary: Negative for difficulty urinating.  Musculoskeletal: Positive for myalgias.      Objective:   Physical Exam  Constitutional: She is oriented to person, place, and time. She appears well-developed and well-nourished. No distress.  BP 118/62 mmHg  Pulse 65  Temp(Src) 97.4 F (36.3 C) (Oral)  Ht 5\' 3"  (1.6 m)  Wt 188 lb 12.8 oz (85.639 kg)  BMI 33.45 kg/m2  SpO2 99%   HENT:  Head: Normocephalic and atraumatic.  Right Ear: External ear normal.  Left Ear: External ear normal.  Mouth/Throat: No oropharyngeal exudate.  Eyes: Right eye exhibits no discharge. Left eye exhibits no discharge. No scleral icterus.  Neck: Normal range of motion. Neck supple.  Cardiovascular: Normal rate, regular rhythm, normal heart sounds and intact distal pulses.  Exam reveals no gallop and no friction rub.   No murmur heard. Pulmonary/Chest: Effort normal and breath sounds normal. No respiratory distress. She has no wheezes. She has no rales. She exhibits no tenderness.  Lymphadenopathy:    She has no cervical adenopathy.  Neurological: She is alert and oriented to person, place, and time. No cranial nerve deficit. She exhibits normal muscle tone. Coordination normal.  Skin: Skin is warm and dry. No rash noted. She is not diaphoretic.  Psychiatric: She has a normal mood and affect. Her behavior is normal. Judgment and thought content normal.        Assessment & Plan:

## 2014-07-31 ENCOUNTER — Telehealth: Payer: Self-pay

## 2014-07-31 NOTE — Telephone Encounter (Signed)
Received phone call from [name] at the kidney center and she said there are no available appointments for patient until 08/06/14.  [name] stated patient and daughter were made aware and are okay with this appointment.

## 2014-08-02 LAB — URINE CULTURE: Colony Count: 15000

## 2014-08-02 NOTE — Telephone Encounter (Signed)
Noted. Thanks.

## 2014-08-06 ENCOUNTER — Telehealth: Payer: Self-pay | Admitting: Internal Medicine

## 2014-08-06 NOTE — Telephone Encounter (Signed)
Patient called for verification of insulin dose. Patient care giver would like written instructions. For patient insulin administration i have printed a letter with in instructions and placed in red folder for approval.

## 2014-08-07 DIAGNOSIS — R300 Dysuria: Secondary | ICD-10-CM | POA: Insufficient documentation

## 2014-08-07 NOTE — Assessment & Plan Note (Signed)
POCT urine shows very little suspicion for infection will obtain culture. Called Dr. Garnett FarmLateef's nurse to see if pt could get in sooner to discuss kidney function. There were no earlier appointments.

## 2014-08-12 ENCOUNTER — Other Ambulatory Visit: Payer: Self-pay | Admitting: Internal Medicine

## 2014-08-12 NOTE — Telephone Encounter (Signed)
Refill

## 2014-08-13 NOTE — Telephone Encounter (Signed)
Ok to refill, with changes made  t instructions.  rx sent

## 2014-09-03 ENCOUNTER — Encounter: Payer: Self-pay | Admitting: Emergency Medicine

## 2014-09-03 ENCOUNTER — Inpatient Hospital Stay
Admission: EM | Admit: 2014-09-03 | Discharge: 2014-09-05 | DRG: 684 | Disposition: A | Discharge status: Home Health | Payer: Medicare Other | Attending: Internal Medicine | Admitting: Internal Medicine

## 2014-09-03 ENCOUNTER — Emergency Department: Payer: Medicare Other

## 2014-09-03 DIAGNOSIS — E785 Hyperlipidemia, unspecified: Secondary | ICD-10-CM | POA: Diagnosis present

## 2014-09-03 DIAGNOSIS — E1129 Type 2 diabetes mellitus with other diabetic kidney complication: Secondary | ICD-10-CM

## 2014-09-03 DIAGNOSIS — I129 Hypertensive chronic kidney disease with stage 1 through stage 4 chronic kidney disease, or unspecified chronic kidney disease: Secondary | ICD-10-CM | POA: Diagnosis present

## 2014-09-03 DIAGNOSIS — N170 Acute kidney failure with tubular necrosis: Principal | ICD-10-CM | POA: Diagnosis present

## 2014-09-03 DIAGNOSIS — K219 Gastro-esophageal reflux disease without esophagitis: Secondary | ICD-10-CM | POA: Diagnosis present

## 2014-09-03 DIAGNOSIS — N184 Chronic kidney disease, stage 4 (severe): Secondary | ICD-10-CM | POA: Diagnosis present

## 2014-09-03 DIAGNOSIS — I739 Peripheral vascular disease, unspecified: Secondary | ICD-10-CM | POA: Diagnosis present

## 2014-09-03 DIAGNOSIS — E1121 Type 2 diabetes mellitus with diabetic nephropathy: Secondary | ICD-10-CM | POA: Diagnosis present

## 2014-09-03 DIAGNOSIS — E860 Dehydration: Secondary | ICD-10-CM | POA: Diagnosis present

## 2014-09-03 DIAGNOSIS — Z794 Long term (current) use of insulin: Secondary | ICD-10-CM | POA: Diagnosis not present

## 2014-09-03 DIAGNOSIS — I959 Hypotension, unspecified: Secondary | ICD-10-CM | POA: Diagnosis present

## 2014-09-03 DIAGNOSIS — N179 Acute kidney failure, unspecified: Secondary | ICD-10-CM

## 2014-09-03 DIAGNOSIS — K5900 Constipation, unspecified: Secondary | ICD-10-CM | POA: Diagnosis present

## 2014-09-03 DIAGNOSIS — I509 Heart failure, unspecified: Secondary | ICD-10-CM | POA: Diagnosis present

## 2014-09-03 DIAGNOSIS — M1990 Unspecified osteoarthritis, unspecified site: Secondary | ICD-10-CM | POA: Diagnosis present

## 2014-09-03 DIAGNOSIS — Z79899 Other long term (current) drug therapy: Secondary | ICD-10-CM | POA: Diagnosis not present

## 2014-09-03 DIAGNOSIS — IMO0002 Reserved for concepts with insufficient information to code with codable children: Secondary | ICD-10-CM

## 2014-09-03 DIAGNOSIS — E1165 Type 2 diabetes mellitus with hyperglycemia: Secondary | ICD-10-CM

## 2014-09-03 DIAGNOSIS — D638 Anemia in other chronic diseases classified elsewhere: Secondary | ICD-10-CM | POA: Diagnosis present

## 2014-09-03 DIAGNOSIS — N189 Chronic kidney disease, unspecified: Secondary | ICD-10-CM

## 2014-09-03 DIAGNOSIS — F17220 Nicotine dependence, chewing tobacco, uncomplicated: Secondary | ICD-10-CM | POA: Diagnosis present

## 2014-09-03 DIAGNOSIS — R079 Chest pain, unspecified: Secondary | ICD-10-CM

## 2014-09-03 HISTORY — DX: Heart failure, unspecified: I50.9

## 2014-09-03 LAB — BASIC METABOLIC PANEL
ANION GAP: 10 (ref 5–15)
BUN: 68 mg/dL — AB (ref 6–20)
CALCIUM: 9.7 mg/dL (ref 8.9–10.3)
CHLORIDE: 104 mmol/L (ref 101–111)
CO2: 22 mmol/L (ref 22–32)
Creatinine, Ser: 5.29 mg/dL — ABNORMAL HIGH (ref 0.44–1.00)
GFR calc Af Amer: 8 mL/min — ABNORMAL LOW (ref >60)
GFR calc non Af Amer: 7 mL/min — ABNORMAL LOW (ref >60)
GLUCOSE: 105 mg/dL — AB (ref 65–99)
Potassium: 4.5 mmol/L (ref 3.5–5.1)
Sodium: 136 mmol/L (ref 135–145)

## 2014-09-03 LAB — CBC
HEMATOCRIT: 30 % — AB (ref 35.0–47.0)
Hemoglobin: 10.2 g/dL — ABNORMAL LOW (ref 12.0–16.0)
MCH: 32.2 pg (ref 26.0–34.0)
MCHC: 33.9 g/dL (ref 32.0–36.0)
MCV: 95 fL (ref 80.0–100.0)
Platelets: 249 10*3/uL (ref 150–440)
RBC: 3.15 MIL/uL — AB (ref 3.80–5.20)
RDW: 12.6 % (ref 11.5–14.5)
WBC: 4.9 10*3/uL (ref 3.6–11.0)

## 2014-09-03 LAB — URINALYSIS COMPLETE WITH MICROSCOPIC (ARMC ONLY)
Bilirubin Urine: NEGATIVE
GLUCOSE, UA: NEGATIVE mg/dL
Ketones, ur: NEGATIVE mg/dL
LEUKOCYTES UA: NEGATIVE
Nitrite: NEGATIVE
Protein, ur: 30 mg/dL — AB
Specific Gravity, Urine: 1.006 (ref 1.005–1.030)
pH: 5 (ref 5.0–8.0)

## 2014-09-03 LAB — GLUCOSE, CAPILLARY
GLUCOSE-CAPILLARY: 154 mg/dL — AB (ref 70–99)
Glucose-Capillary: 124 mg/dL — ABNORMAL HIGH (ref 70–99)
Glucose-Capillary: 147 mg/dL — ABNORMAL HIGH (ref 70–99)

## 2014-09-03 LAB — TROPONIN I
Troponin I: <0.03 ng/mL (ref <0.031)
Troponin I: <0.03 ng/mL (ref <0.031)

## 2014-09-03 MED ORDER — PANTOPRAZOLE SODIUM 40 MG PO TBEC
40.0000 mg | DELAYED_RELEASE_TABLET | Freq: Every day | ORAL | Status: DC
  Administered 2014-09-03 – 2014-09-04 (×2): 40 mg via ORAL
  Filled 2014-09-03 (×2): qty 1

## 2014-09-03 MED ORDER — ONDANSETRON HCL 4 MG PO TABS: 4.0000 mg | ORAL_TABLET | Freq: Four times a day (QID) | ORAL | Status: DC | PRN

## 2014-09-03 MED ORDER — SENNA 8.6 MG PO TABS
1.0000 | ORAL_TABLET | Freq: Two times a day (BID) | ORAL | Status: DC
  Administered 2014-09-03 – 2014-09-05 (×5): 8.6 mg via ORAL
  Filled 2014-09-03 (×5): qty 1

## 2014-09-03 MED ORDER — SODIUM CHLORIDE 0.9 % IV BOLUS (SEPSIS)
1000.0000 mL | Freq: Once | INTRAVENOUS | Status: AC
  Administered 2014-09-03: 1000 mL via INTRAVENOUS

## 2014-09-03 MED ORDER — SPIRONOLACTONE 25 MG PO TABS
50.0000 mg | ORAL_TABLET | Freq: Every day | ORAL | Status: DC
  Administered 2014-09-03 – 2014-09-05 (×3): 50 mg via ORAL
  Filled 2014-09-03 (×3): qty 2

## 2014-09-03 MED ORDER — ONDANSETRON HCL 4 MG/2ML IJ SOLN: 4.0000 mg | Freq: Four times a day (QID) | INTRAMUSCULAR | Status: DC | PRN

## 2014-09-03 MED ORDER — HEPARIN SODIUM (PORCINE) 5000 UNIT/ML IJ SOLN
5000.0000 [IU] | Freq: Three times a day (TID) | INTRAMUSCULAR | Status: DC
  Administered 2014-09-03 – 2014-09-05 (×6): 5000 [IU] via SUBCUTANEOUS
  Filled 2014-09-03 (×8): qty 1

## 2014-09-03 MED ORDER — INSULIN ASPART 100 UNIT/ML ~~LOC~~ SOLN
0.0000 [IU] | Freq: Three times a day (TID) | SUBCUTANEOUS | Status: DC
  Administered 2014-09-03: 15:00:00 1 [IU] via SUBCUTANEOUS
  Administered 2014-09-03: 17:00:00 2 [IU] via SUBCUTANEOUS
  Administered 2014-09-04: 1 [IU] via SUBCUTANEOUS
  Administered 2014-09-05: 13:00:00 2 [IU] via SUBCUTANEOUS
  Filled 2014-09-03 (×2): qty 1
  Filled 2014-09-03 (×2): qty 2

## 2014-09-03 MED ORDER — DOCUSATE SODIUM 100 MG PO CAPS
100.0000 mg | ORAL_CAPSULE | Freq: Two times a day (BID) | ORAL | Status: DC
  Administered 2014-09-03 – 2014-09-05 (×5): 100 mg via ORAL
  Filled 2014-09-03 (×5): qty 1

## 2014-09-03 MED ORDER — ACETAMINOPHEN 325 MG PO TABS: 650.0000 mg | ORAL_TABLET | Freq: Four times a day (QID) | ORAL | Status: DC | PRN

## 2014-09-03 MED ORDER — POLYETHYLENE GLYCOL 3350 17 G PO PACK: 17.0000 g | PACK | Freq: Every day | ORAL | Status: DC | PRN

## 2014-09-03 MED ORDER — ALPRAZOLAM 0.5 MG PO TABS
0.5000 mg | ORAL_TABLET | Freq: Two times a day (BID) | ORAL | Status: DC | PRN
Start: 2014-09-03 — End: 2014-09-05
  Administered 2014-09-03 – 2014-09-05 (×4): 0.5 mg via ORAL
  Filled 2014-09-03 (×5): qty 1

## 2014-09-03 MED ORDER — ACETAMINOPHEN 650 MG RE SUPP: 650.0000 mg | Freq: Four times a day (QID) | RECTAL | Status: DC | PRN

## 2014-09-03 MED ORDER — ALUM & MAG HYDROXIDE-SIMETH 200-200-20 MG/5ML PO SUSP: 30.0000 mL | Freq: Four times a day (QID) | ORAL | Status: DC | PRN

## 2014-09-03 MED ORDER — SODIUM CHLORIDE 0.9 % IV SOLN
INTRAVENOUS | Status: DC
  Administered 2014-09-03 – 2014-09-04 (×4): via INTRAVENOUS

## 2014-09-03 MED ORDER — OXYCODONE HCL 5 MG PO TABS: 5.0000 mg | ORAL_TABLET | ORAL | Status: DC | PRN

## 2014-09-03 MED ORDER — ROSUVASTATIN CALCIUM 20 MG PO TABS
20.0000 mg | ORAL_TABLET | Freq: Every day | ORAL | Status: DC
  Administered 2014-09-03 – 2014-09-05 (×3): 20 mg via ORAL
  Filled 2014-09-03 (×3): qty 1

## 2014-09-03 NOTE — ED Notes (Signed)
Patient is resting comfortably. 

## 2014-09-03 NOTE — Progress Notes (Signed)
Subjective:   Patient known to our practice from outpatient. She is followed for diabetic nephropathy Baseline Cr 3.38 in march 2016/ GFR 17 Presents with "feeling bad". Found to have worsening of creatinine  Objective:  Vital signs in last 24 hours:  Temp:  [97.7 F (36.5 C)-97.8 F (36.6 C)] 97.7 F (36.5 C) (05/10 1258) Pulse Rate:  [63-75] 75 (05/10 1258) Resp:  [18-20] 20 (05/10 1030) BP: (98-149)/(33-66) 138/40 mmHg (05/10 1258) SpO2:  [99 %-100 %] 100 % (05/10 1258) Weight:  [87.091 kg (192 lb)-87.317 kg (192 lb 8 oz)] 87.317 kg (192 lb 8 oz) (05/10 1315)  Weight change:  Filed Weights   09/03/14 0642 09/03/14 1315  Weight: 87.091 kg (192 lb) 87.317 kg (192 lb 8 oz)    Intake/Output:     Intake/Output this shift:     Physical Exam: General: NAD,   Head: Normocephalic, atraumatic. Moist oral mucosal membranes  Eyes: Anicteric,   Neck: Supple, trachea midline  Lungs:  Clear to auscultation  Heart: Regular rate and rhythm  Abdomen:  Soft, nontender,   Extremities:  + peripheral edema.  Neurologic: Nonfocal, moving all four extremities  Skin: No lesions  Access:     Basic Metabolic Panel:  Recent Labs Lab 09/03/14 0655  NA 136  K 4.5  CL 104  CO2 22  GLUCOSE 105*  BUN 68*  CREATININE 5.29*  CALCIUM 9.7    Liver Function Tests: No results for input(s): AST, ALT, ALKPHOS, BILITOT, PROT, ALBUMIN in the last 168 hours. No results for input(s): LIPASE, AMYLASE in the last 168 hours. No results for input(s): AMMONIA in the last 168 hours.  CBC:  Recent Labs Lab 09/03/14 0655  WBC 4.9  HGB 10.2*  HCT 30.0*  MCV 95.0  PLT 249    Cardiac Enzymes:  Recent Labs Lab 09/03/14 0655 09/03/14 0947  TROPONINI <0.03 <0.03    BNP: Invalid input(s): POCBNP  CBG:  Recent Labs Lab 09/03/14 1346 09/03/14 1626  GLUCAP 124* 154*    Microbiology: Results for orders placed or performed in visit on 07/30/14  Urine culture     Status: None   Collection Time: 07/30/14  1:20 PM  Result Value Ref Range Status   Colony Count 15,000 COLONIES/ML  Final   Organism ID, Bacteria Multiple bacterial morphotypes present, none  Final   Organism ID, Bacteria predominant. Suggest appropriate recollection if   Final   Organism ID, Bacteria clinically indicated.  Final    Coagulation Studies: No results for input(s): LABPROT, INR in the last 72 hours.  Urinalysis:  Recent Labs  09/03/14 1041  COLORURINE STRAW*  LABSPEC 1.006  PHURINE 5.0  GLUCOSEU NEGATIVE  HGBUR 2+*  BILIRUBINUR NEGATIVE  KETONESUR NEGATIVE  PROTEINUR 30*  NITRITE NEGATIVE  LEUKOCYTESUR NEGATIVE      Imaging: Dg Chest 2 View  09/03/2014   CLINICAL DATA:  79 year old female with weakness and shortness of breath on exertion for years. Heartbeat felt in the ear. Former smoker. Hypertension. Initial encounter.  EXAM: CHEST  2 VIEW  COMPARISON:  01/06/2014, 06/10/2013 and 05/15/2013  FINDINGS: Chronic minimal peribronchial thickening.  No infiltrate, congestive heart failure or pneumothorax.  Heart size within normal limits.  IMPRESSION: No active cardiopulmonary disease.   Electronically Signed   By: Lacy DuverneySteven  Olson M.D.   On: 09/03/2014 07:55     Medications:   . sodium chloride 100 mL/hr at 09/03/14 1348   . docusate sodium  100 mg Oral BID  . heparin  5,000 Units Subcutaneous 3 times per day  . insulin aspart  0-9 Units Subcutaneous TID WC  . pantoprazole  40 mg Oral Daily  . rosuvastatin  20 mg Oral Daily  . senna  1 tablet Oral BID  . spironolactone  50 mg Oral Daily   acetaminophen **OR** acetaminophen, ALPRAZolam, alum & mag hydroxide-simeth, ondansetron **OR** ondansetron (ZOFRAN) IV, polyethylene glycol  Assessment/ Plan:  79 y.o. African American  female with diabetes, congestive heart failure, hypertension, peripheral vascular disease, chronic peripheral edema, Cardiac murmur and Bruits, CHF- Dr Juliann Paresallwood, leukopenia (constitutional)-evaluated by  Dr. Sherrlyn HockPandit December 2011, Proteinuria 2.8 g by protein creatinine ratio, GOUT,   1. ARF , possibly from hypotension/ATN 2. CKD st 4. Baseline Cr 3.38 in march 2016/ GFR 17 3. HTN - BP is well controlled at present. Agree with holding BP meds 4. Peripheral edema- Agree with holding diuretics and gentle hydration. Currently on spironolactone    LOS: 0 Neasia Fleeman 5/10/20164:47 PM

## 2014-09-03 NOTE — ED Provider Notes (Signed)
  Physical Exam  BP 117/33 mmHg  Pulse 68  Temp(Src) 97.8 F (36.6 C) (Oral)  Resp 18  Ht 5\' 4"  (1.626 m)  Wt 192 lb (87.091 kg)  BMI 32.94 kg/m2  SpO2 100%  Labs Reviewed  CBC - Abnormal; Notable for the following:    RBC 3.15 (*)    Hemoglobin 10.2 (*)    HCT 30.0 (*)    All other components within normal limits  BASIC METABOLIC PANEL - Abnormal; Notable for the following:    Glucose, Bld 105 (*)    BUN 68 (*)    Creatinine, Ser 5.29 (*)    GFR calc non Af Amer 7 (*)    GFR calc Af Amer 8 (*)    All other components within normal limits  TROPONIN I  URINALYSIS COMPLETEWITH MICROSCOPIC ([facility])   TROPONIN I  ----------------------------------------- 10:16 AM on 09/03/2014 -----------------------------------------    Physical Exam Patient is resting comfortably at this time. ED Course  Procedures  MDM Patient found to have elevated creatinine of 5.29 with a normal potassium. Last creatinine before this was 3.38. Patient will be admitted to the hospital. I will order her fluids in the emergency department. I discussed the lab results with the patient at the bedside. She is tearful and keeps saying how she does not want to go on dialysis. I counseled her that we will be watching her kidney function closely over the next couple days and that the conversation without dialysis will likely have to be one that happens over the next few days to follow her kidney function and with her nephrologist Dr. Thedore MinsSingh.  Assessment and plan. Acute on chronic renal failure. Initial visit      Myrna Blazeravid Matthew Schaevitz, MD 09/03/14 1018

## 2014-09-03 NOTE — ED Notes (Signed)
Vital signs stable. 

## 2014-09-03 NOTE — Plan of Care (Signed)
Problem: Discharge Progression Outcomes Goal: Discharge plan in place and appropriate Outcome: Progressing Individualization of Care  Goes by [name]  Goal: Other Discharge Outcomes/Goals Outcome: Progressing Patient has no c/o pain, is tolerating diet well, VSS except diastolic pressure is low MD aware, High Fall risk bed alarm on

## 2014-09-03 NOTE — ED Notes (Signed)
Pt to ED via EMS from [facility] c/o weakness.  Pt states tingling feeling, "weird" feeling all night.  Pt also c/o "booming" in left ear.  Pt has hx of hearing loss and has hearing aid but doesn't wear.  Pt denies chest pain, HA, abd pain, n/v/d.  No edema noted bilaterally.  Pt reports feeling weak and "just off".  Pt has hx of HTN, DBM, CHF.  Pt A&Ox4, speaking in complete and coherent sentences and in NAD at this time.

## 2014-09-03 NOTE — H&P (Signed)
[facility] NAME: [name]    MR#:  [id number]  DATE OF BIRTH:  Nov 30, 1935  DATE OF ADMISSION:  09/03/2014  PRIMARY CARE PHYSICIAN: Crecencio Mc, MD   REQUESTING/REFERRING PHYSICIAN: DR Clearnce Hasten  CHIEF COMPLAINT:   Chief Complaint  Patient presents with  . Fatigue    HISTORY OF PRESENT ILLNESS:  [name]  is a 79 y.o. female with a known history of CKD-III (baeline creat 2.5), Dm-2, HTN, chronic back pain, anemia, arthritis, history of congestive heart failure comes in with increasing fatigue dizziness and chronic back pain. Patient came in with increasing weakness dizziness and not feeling well for a few days. She was found to have creatinine of 5.6. Her baseline creatinine is on 2.5. She received IV fluids in the emergency room. Her blood pressure during my evaluation systolic is 665/99. Sats are 100% on room air. Patient is being admitted for acute on chronic renal failure suspected due to dehydration for further evaluation and management.  Patient's primary nephrologist is Dr. Candiss Norse.  PAST MEDICAL HISTORY:   Past Medical History  Diagnosis Date  . Diabetes mellitus   . GERD (gastroesophageal reflux disease)   . Hypertension   . Anemia   . Arthritis   . Hemorrhoids   . Hypertension   . CHF (congestive heart failure)     PAST SURGICAL HISTOIRY:   Past Surgical History  Procedure Laterality Date  . Cholecystectomy    . Abdominal hysterectomy    . Back surgery    . Arthroplasty w/ arthroscopy medial / lateral compartment knee  Feb 2011    [name]  . Hemorrhoid surgery  12/19/12    SOCIAL HISTORY:   History  Substance Use Topics  . Smoking status: Former Smoker -- 30 years    Types: Cigarettes    Quit date: 09/28/2001  . Smokeless tobacco: Current User    Types: Snuff  . Alcohol Use: No    FAMILY HISTORY:   Family History  Problem Relation Age of Onset  . Heart disease Mother    . Hypertension Mother   . Cancer Mother     DRUG ALLERGIES:   Allergies  Allergen Reactions  . Hydrocodone-Acetaminophen Nausea Only    Other reaction(s): Dizziness  . Morphine And Related Other (See Comments)    Makes patient feel crazy    REVIEW OF SYSTEMS:   ROS CONSTITUTIONAL: no fever, + fatigue or weakness.  EYES: No blurred or double vision.  EARS, NOSE, AND THROAT: No tinnitus or ear pain. Impaired hearing. RESPIRATORY: No cough, shortness of breath, wheezing or hemoptysis.  CARDIOVASCULAR: No chest pain, orthopnea, edema.  GASTROINTESTINAL: No nausea, vomiting, diarrhea or abdominal pain.  GENITOURINARY: No dysuria, hematuria.  ENDOCRINE: No polyuria, nocturia,  HEMATOLOGY: No anemia, easy bruising or bleeding SKIN: No rash or lesion. MUSCULOSKELETAL: No joint pain or arthritis.   NEUROLOGIC: No tingling, numbness, weakness.  PSYCHIATRY: No anxiety or depression.   MEDICATIONS AT HOME:   Prior to Admission medications   Medication Sig Start Date End Date Taking? Authorizing Provider  ALPRAZolam Duanne Moron) 0.5 MG tablet TAKE ONE TABLET TWICE DAILY AS NEEDED FOR ANXIETY 07/10/14   Crecencio Mc, MD  atenolol (TENORMIN) 100 MG tablet TAKE 1 TABLET BY MOUTH TWICE A DAY. 07/01/14   Crecencio Mc, MD  Blood Glucose Monitoring Suppl (ONE TOUCH ULTRA SYSTEM KIT) W/DEVICE KIT 1 kit by Does not apply route once. 10/01/13   Helene Kelp  Ether Griffins, MD  cloNIDine (CATAPRES) 0.1 MG tablet Take 0.1 mg by mouth 2 (two) times daily.    Historical Provider, MD  CRESTOR 20 MG tablet TAKE ONE (1) TABLET EACH DAY 04/15/14   Crecencio Mc, MD  furosemide (LASIX) 40 MG tablet TAKE ONE TABLET BY MOUTH TWO TIMES DAILY 05/20/14   Crecencio Mc, MD  glipiZIDE (GLUCOTROL) 10 MG tablet TAKE ONE TABLET TWICE DAILY BEFORE A MEAL 06/19/14   Crecencio Mc, MD  glucose blood (ONE TOUCH ULTRA TEST) test strip Check sugar 2-3 times daily Dx 250.42 02/07/14   Crecencio Mc, MD  Insulin Isophane & Regular Human  (HUMULIN 70/30 KWIKPEN) (70-30) 100 UNIT/ML PEN Take 15 units in the morning and 20 units before dinner 03/12/14   Crecencio Mc, MD  lactulose (CHRONULAC) 10 GM/15ML solution Take 15 mLs (10 g total) by mouth once a week. As needed for constipation 08/13/14   Crecencio Mc, MD  linagliptin (TRADJENTA) 5 MG TABS tablet Take 1 tablet (5 mg total) by mouth daily. Patient not taking: Reported on 07/30/2014 10/15/13   Crecencio Mc, MD  NEXIUM 40 MG capsule TAKE ONE (1) CAPSULE EACH DAY BEFORE BREAKFAST 01/25/14   Crecencio Mc, MD  spironolactone (ALDACTONE) 50 MG tablet TAKE ONE (1) TABLET EACH DAY 04/23/14   Crecencio Mc, MD  ULORIC 40 MG tablet TAKE ONE (1) TABLET EACH DAY 04/23/14   Crecencio Mc, MD  valsartan (DIOVAN) 320 MG tablet TAKE ONE (1) TABLET EACH DAY 05/20/14   Crecencio Mc, MD      VITAL SIGNS:  Blood pressure 117/48, pulse 66, temperature 97.8 F (36.6 C), temperature source Oral, resp. rate 20, height $RemoveBe'5\' 4"'nkbqoNmZL$  (1.626 m), weight 87.091 kg (192 lb), SpO2 100 %.  PHYSICAL EXAMINATION:  GENERAL:  79 y.o.-year-old patient lying in the bed with no acute distress.  EYES: Pupils equal, round, reactive to light and accommodation. No scleral icterus. Extraocular muscles intact.  HEENT: Head atraumatic, normocephalic. Oropharynx and nasopharynx clear.  NECK:  Supple, no jugular venous distention. No thyroid enlargement, no tenderness.  LUNGS: Normal breath sounds bilaterally, no wheezing, rales,rhonchi or crepitation. No use of accessory muscles of respiration.  CARDIOVASCULAR: S1, S2 normal. No murmurs, rubs, or gallops.  ABDOMEN: Soft, nontender, nondistended. Bowel sounds present. No organomegaly or mass.  EXTREMITIES: No pedal edema, cyanosis, or clubbing. Chronic mild leg edema NEUROLOGIC: Cranial nerves II through XII are intact. Muscle strength 5/5 in all extremities. Sensation intact. Gait not checked.  PSYCHIATRIC: The patient is alert and oriented x 3.  SKIN: No obvious rash,  lesion, or ulcer.   LABORATORY PANEL:   CBC  Recent Labs Lab 09/03/14 0655  WBC 4.9  HGB 10.2*  HCT 30.0*  PLT 249   ------------------------------------------------------------------------------------------------------------------  Chemistries   Recent Labs Lab 09/03/14 0655  NA 136  K 4.5  CL 104  CO2 22  GLUCOSE 105*  BUN 68*  CREATININE 5.29*  CALCIUM 9.7   ------------------------------------------------------------------------------------------------------------------  Cardiac Enzymes  Recent Labs Lab 09/03/14 0655  TROPONINI <0.03   ------------------------------------------------------------------------------------------------------------------  RADIOLOGY:  Dg Chest 2 View  09/03/2014   CLINICAL DATA:  79 year old female with weakness and shortness of breath on exertion for years. Heartbeat felt in the ear. Former smoker. Hypertension. Initial encounter.  EXAM: CHEST  2 VIEW  COMPARISON:  01/06/2014, 06/10/2013 and 05/15/2013  FINDINGS: Chronic minimal peribronchial thickening.  No infiltrate, congestive heart failure or pneumothorax.  Heart size within  normal limits.  IMPRESSION: No active cardiopulmonary disease.   Electronically Signed   By: Genia Del M.D.   On: 09/03/2014 07:55    EKG:   Orders placed or performed during the hospital encounter of 09/03/14  . EKG 12-Lead  . EKG 12-Lead    IMPRESSION AND PLAN:  80 year old Mr. Pat Patrick with history of type 2 diabetes C Katy stage III Baseline creatinine around 2.5 with history of hypertension GERD chronic back pain comes in with increasing weakness and was found to have creatinine of 5.6. She is being admitted with all #1 acute on chronic renal failure suspected due to dehydration and medication related. Will admit patient to medical's MedSurg floor.  continue IV fluids. Monitor I's and O's monitor creatinine. Will consult nephrology Dr. Candiss Norse. Avoid nephrotoxins.  #2 type 2 diabetes Continue  Glucotrol,Tradjenta and 70/30. Will adjust insulin according to patient's sugar.  #3  history of hypertension with relative hypotension . Hold blood pressure meds. Resume once blood pressure is stable. #4 GERD continue Nexium. Next #5 hyperlipidemia continue Crestor. #6 constipation due stool softeners. Laxative as needed. #7 DVT prophylaxis subcutaneous heparin 3 times a day. Next #8 generalized weakness with dizziness will have physical therapy evaluate. #9 case management for discharge planning.    Above was discussed with patient's daughter [place] phone number [phone].    All the records are reviewed and case discussed with ED provider. Management plans discussed with the patient, family and they are in agreement.  CODE STATUS: Full  TOTAL TIME TAKING CARE OF THIS PATIENT: 55 minutes.    Kealani Leckey M.D on 09/03/2014 at 11:06 AM  Between 7am to 6pm - Pager - [phone]  After 6pm go to [email or web address] - password [email or web address] [facility]  Office  [phone]  CC: Primary care physician; Crecencio Mc, MD

## 2014-09-03 NOTE — ED Provider Notes (Signed)
[facility] Emergency Department Provider Note  ____________________________________________  Time seen: Approximately 6:34 AM  I have reviewed the triage vital signs and the nursing notes.   HISTORY  Chief Complaint Fatigue    HPI Sheniqua Roseland is a 79 y.o. female comes in tonight with the concern for low blood pressure. The patient reports that she is diabetic and has high blood pressure but ever since she started taking the medications for her diabetes she reports that she's been having lower blood pressures. She reports that last night prior to going to bed she had a blood pressure that was 85/40s. The patient reports that she retook the blood pressure and it was 115/60s. Per the EMS they were called for chest pain. The patient reports that she does have pains here and there but they don't last very long. She reports that she did have some left-sided chest pain that came and went away quickly. The patient reports that while she has the small pains she never knows when the big one will be coming. The patient also was having some booming in her left ear. The patient does have a history of hearing loss and has hearing aids but she does not wear them. She does have some occasional dizziness with no shortness of breath. He also reports intermittent headache and swelling in her legs. The patient reports that she does have some anxiety but came in again for further evaluation of her blood pressure.   Past Medical History  Diagnosis Date  . Diabetes mellitus   . GERD (gastroesophageal reflux disease)   . Hypertension   . Anemia   . Arthritis   . Hemorrhoids   . Hypertension   . CHF (congestive heart failure)     Patient Active Problem List   Diagnosis Date Noted  . Dysuria 08/07/2014  . Hyponatremia 01/19/2014  . Arm pain, left 09/23/2013  . Back pain of thoracolumbar region 09/23/2013  . Visit for preventive health examination 08/18/2013  . Laceration of ear,  right, simple 08/18/2013  . Obesity, unspecified 07/07/2013  . Depression with anxiety 06/26/2013  . Hypomagnesemia 05/20/2013  . Acute renal failure 05/17/2013  . Viral URI 05/13/2013  . Hemorrhoids 11/06/2012  . Hidradenitis suppurativa 10/05/2012  . Unspecified hypothyroidism 07/24/2012  . Cognitive changes 01/18/2012  . Back pain, thoracic 11/27/2011  . Venous insufficiency of leg 11/24/2011  . Type 2 diabetes, uncontrolled, with renal manifestation 10/03/2011  . Anemia of chronic renal failure 10/03/2011  . Chest pain with normal coronary angiography 04/21/2011  . Screening for colon cancer 04/12/2011  . Screening for breast cancer 04/12/2011  . Hypertension 04/12/2011  . Hyperlipidemia LDL goal < 100 04/12/2011  . Screening for osteoporosis 04/12/2011    Past Surgical History  Procedure Laterality Date  . Cholecystectomy    . Abdominal hysterectomy    . Back surgery    . Arthroplasty w/ arthroscopy medial / lateral compartment knee  Feb 2011    [name]  . Hemorrhoid surgery  12/19/12    Current Outpatient Rx  Name  Route  Sig  Dispense  Refill  . ALPRAZolam (XANAX) 0.5 MG tablet      TAKE ONE TABLET TWICE DAILY AS NEEDED FOR ANXIETY   60 tablet   2   . atenolol (TENORMIN) 100 MG tablet      TAKE 1 TABLET BY MOUTH TWICE A DAY.   60 tablet   5   . Blood Glucose Monitoring Suppl ([place]  KIT) W/DEVICE KIT   Does not apply   1 kit by Does not apply route once.   1 each   0     Dx: 250.42   . cloNIDine (CATAPRES) 0.1 MG tablet   Oral   Take 0.1 mg by mouth 2 (two) times daily.         . CRESTOR 20 MG tablet      TAKE ONE (1) TABLET EACH DAY   30 tablet   5   . furosemide (LASIX) 40 MG tablet      TAKE ONE TABLET BY MOUTH TWO TIMES DAILY   60 tablet   5   . glipiZIDE (GLUCOTROL) 10 MG tablet      TAKE ONE TABLET TWICE DAILY BEFORE A MEAL   60 tablet   6   . glucose blood (ONE TOUCH ULTRA TEST) test strip      Check sugar  2-3 times daily Dx 250.42   100 each   5   . Insulin Isophane & Regular Human (HUMULIN 70/30 KWIKPEN) (70-30) 100 UNIT/ML PEN      Take 15 units in the morning and 20 units before dinner   15 mL   11   . lactulose (CHRONULAC) 10 GM/15ML solution   Oral   Take 15 mLs (10 g total) by mouth once a week. As needed for constipation   240 mL   1   . linagliptin (TRADJENTA) 5 MG TABS tablet   Oral   Take 1 tablet (5 mg total) by mouth daily. Patient not taking: Reported on 07/30/2014   30 tablet   11   . NEXIUM 40 MG capsule      TAKE ONE (1) CAPSULE EACH DAY BEFORE BREAKFAST   30 capsule   11   . spironolactone (ALDACTONE) 50 MG tablet      TAKE ONE (1) TABLET EACH DAY   30 tablet   5   . ULORIC 40 MG tablet      TAKE ONE (1) TABLET EACH DAY   30 tablet   5   . valsartan (DIOVAN) 320 MG tablet      TAKE ONE (1) TABLET EACH DAY   30 tablet   5     Allergies Hydrocodone-acetaminophen and Morphine and related  Family History  Problem Relation Age of Onset  . Heart disease Mother   . Hypertension Mother   . Cancer Mother     Social History History  Substance Use Topics  . Smoking status: Former Smoker -- 30 years    Types: Cigarettes    Quit date: 09/28/2001  . Smokeless tobacco: Current User    Types: Snuff  . Alcohol Use: No    Review of Systems Constitutional: No fever/chills Eyes: No visual changes. ENT: No sore throat. Cardiovascular: chest pain. Respiratory: Denies shortness of breath. Gastrointestinal: No abdominal pain.  No nausea, no vomiting.   Genitourinary: Negative for dysuria. Musculoskeletal: Back pain. Skin: Negative for rash. Neurological: Occasional headaches and occasional dizziness  10-point ROS otherwise negative.  ____________________________________________   PHYSICAL EXAM:  VITAL SIGNS: ED Triage Vitals  Enc Vitals Group     BP 09/03/14 0642 117/33 mmHg     Pulse Rate 09/03/14 0642 68     Resp 09/03/14 0642 18      Temp 09/03/14 0642 97.8 F (36.6 C)     Temp Source 09/03/14 0642 Oral     SpO2 09/03/14 0642 100 %     Weight  09/03/14 0642 192 lb (87.091 kg)     Height 09/03/14 0642 $RemoveBefor'5\' 4"'OfBfneCgECMx$  (1.626 m)     Head Cir --      Peak Flow --      Pain Score 09/03/14 0644 0     Pain Loc --      Pain Edu? --      Excl. in [place]? --     Constitutional: Alert and oriented. Well appearing and in no acute distress. Eyes: Conjunctivae are normal. PERRL. EOMI. Head: Atraumatic. Nose: No congestion/rhinnorhea. Mouth/Throat: Mucous membranes are moist.  Oropharynx non-erythematous. Cardiovascular: Normal rate, regular rhythm. Grossly normal heart sounds.  Good peripheral circulation. Respiratory: Normal respiratory effort.  No retractions. Lungs CTAB. Gastrointestinal: Soft and nontender. No distention.  Genitourinary: Deferred Musculoskeletal: Mild tenderness to palpation of lower extremities bilaterally with minimal edema Neurologic:  Normal speech and language. No gross focal neurologic deficits are appreciated. Speech is normal.  Skin:  Skin is warm, dry and intact. No rash noted. Psychiatric: Mood and affect are normal. Speech and behavior are normal.  ____________________________________________   LABS (all labs ordered are listed, but only abnormal results are displayed)  Labs Reviewed  CBC - Abnormal; Notable for the following:    RBC 3.15 (*)    Hemoglobin 10.2 (*)    HCT 30.0 (*)    All other components within normal limits  BASIC METABOLIC PANEL - Abnormal; Notable for the following:    Glucose, Bld 105 (*)    BUN 68 (*)    Creatinine, Ser 5.29 (*)    GFR calc non Af Amer 7 (*)    GFR calc Af Amer 8 (*)    All other components within normal limits  TROPONIN I  URINALYSIS COMPLETEWITH MICROSCOPIC ([facility])    ____________________________________________  EKG  ED ECG REPORT   Date: 09/03/2014  EKG Time: 642  Rate: 67  Rhythm: normal EKG, normal sinus rhythm  Axis: Normal   Intervals:first-degree A-V block   ST&T Change: Normal  ____________________________________________  RADIOLOGY  Chest x-ray no acute disease ____________________________________________   PROCEDURES  Procedure(s) performed: None  Critical Care performed: No  ____________________________________________   INITIAL IMPRESSION / ASSESSMENT AND PLAN / ED COURSE  Pertinent labs & imaging results that were available during my care of the patient were reviewed by me and considered in my medical decision making (see chart for details).  This is a 79 year old female who comes in tonight with some intermittent hypotensive episodes as well as an occasion of chest pain and feeling unwell. We will check some blood work we will check some orthostatic vital signs we will check the patient's urinalysis and reassess the patient once we have received some results.  The patient's initial blood work is unremarkable. We will continue waiting for the patient's urinalysis as well as repeat the patient's troponin. The patient is not orthostatic.  The patient's care was signed out to Dr. Dineen Kid who will follow up the results of the urine and the repeat troponin. ____________________________________________   FINAL CLINICAL IMPRESSION(S) / ED DIAGNOSES  Final diagnoses:  None      Loney Hering, MD 09/03/14 [phone]

## 2014-09-03 NOTE — Progress Notes (Signed)
Called Dr Allena KatzPatel BP 138/40 told her diastolic  running low,  No new orders continue to monitor

## 2014-09-03 NOTE — Evaluation (Signed)
Physical Therapy Evaluation Patient Details Name: [name] LoboMertis Hoggard MRN: [id number] DOB: May 14, 1935 Today's Date: 09/03/2014   History of Present Illness  79 yo female with onset of  highly elevated creatinine and low BP was referred to PT to address needs for strength and endurance.  Lives alone in single level environment, PMHx:  CKD 3, DM, HTN, back pain, anemia, OA, CHF, GERD  Clinical Impression  Pt was assessed after admission to see if she was safely independent with walking.  Her plan is to go home but PT recommending a trip to SNF first.   Pt is not going to succeed without more standing endurance and safety awareness, but also must increase standing balance to control walker and to decrease her fall risk.    Follow Up Recommendations SNF    Equipment Recommendations  Rolling walker with 5" wheels    Recommendations for Other Services       Precautions / Restrictions Precautions Precautions: Fall Restrictions Weight Bearing Restrictions: No      Mobility  Bed Mobility Overal bed mobility: Needs Assistance Bed Mobility: Rolling;Supine to Sit Rolling: Min assist   Supine to sit: Mod assist     General bed mobility comments: assisted under trunk and to swing legs off bed  Transfers Overall transfer level: Needs assistance Equipment used: Rolling walker (2 wheeled);1 person hand held assist Transfers: Sit to/from [employer] CorporationStand;Stand Pivot Transfers Sit to Stand: Min assist Stand pivot transfers: Min assist       General transfer comment: remidners for hand placement and for safety  Ambulation/Gait Ambulation/Gait assistance: Min assist Ambulation Distance (Feet): 25 Feet Assistive device: Rolling walker (2 wheeled);1 person hand held assist Gait Pattern/deviations: Step-through pattern;Decreased step length - right;Decreased step length - left;Decreased stride length;Decreased dorsiflexion - right;Decreased dorsiflexion - left;Shuffle;Trunk flexed;Drifts right/left;Wide  base of support Gait velocity: reduced Gait velocity interpretation: Below normal speed for age/gender General Gait Details: confined spaces are difficult to navigate with walker, but is able to control her walker better with vc's and clear spaces.  Needed reminding not to just back up walker to chair or bed the entire trip  Stairs            Wheelchair Mobility    Modified Rankin (Stroke Patients Only)       Balance Overall balance assessment: History of Falls;Needs assistance Sitting-balance support: Feet supported Sitting balance-Leahy Scale: Fair   Postural control: Posterior lean Standing balance support: Bilateral upper extremity supported Standing balance-Leahy Scale: Poor                               Pertinent Vitals/Pain Pain Assessment: No/denies pain    Home Living Family/patient expects to be discharged to:: Private residence Living Arrangements: Alone Available Help at Discharge:  (unsure) Type of Home: Apartment Home Access: Level entry     Home Layout: One level Home Equipment: Cane - single point;Shower seat      Prior Function Level of Independence: Independent with assistive device(s)               Hand Dominance        Extremity/Trunk Assessment   Upper Extremity Assessment: Overall WFL for tasks assessed           Lower Extremity Assessment: Generalized weakness      Cervical / Trunk Assessment: Normal  Communication   Communication: No difficulties  Cognition Arousal/Alertness: Awake/alert Behavior During Therapy: WFL for tasks assessed/performed Overall Cognitive  Status: Within Functional Limits for tasks assessed                      General Comments General comments (skin integrity, edema, etc.): Pt is feeling tired after worrying today about needing a new course of HD, but is not scheduled yet.    Exercises        Assessment/Plan    PT Assessment Patient needs continued PT services   PT Diagnosis Difficulty walking;Generalized weakness   PT Problem List Decreased strength;Decreased range of motion;Decreased activity tolerance;Decreased balance;Decreased mobility;Decreased coordination;Decreased cognition;Decreased knowledge of use of DME;Decreased safety awareness;Decreased knowledge of precautions;Decreased skin integrity  PT Treatment Interventions Gait training;Functional mobility training;Therapeutic exercise;Therapeutic activities;Balance training;Neuromuscular re-education;Cognitive remediation;Patient/family education   PT Goals (Current goals can be found in the Care Plan section) Acute Rehab PT Goals Patient Stated Goal: to get home  PT Goal Formulation: With patient Time For Goal Achievement: 09/17/14 Potential to Achieve Goals: Good    Frequency Min 2X/week   Barriers to discharge Decreased caregiver support home alone    Co-evaluation               End of Session Equipment Utilized During Treatment: Gait belt Activity Tolerance: No increased pain;Patient tolerated treatment well Patient left: in bed;with call bell/phone within reach;with bed alarm set Nurse Communication: Mobility status         Time: [id number] PT Time Calculation (min) (ACUTE ONLY): 30 min   Charges:   PT Evaluation $Initial PT Evaluation Tier I: 1 Procedure PT Treatments $Gait Training: 8-22 mins   Samul Dadauth Geo Slone, PT MS Acute Rehab Dept. Number: [facility] [phone] and [facility] [phone]   PT G Codes:        Ivar DrapeStout, Aricela Bertagnolli E 09/03/2014, 5:16 PM

## 2014-09-04 LAB — BASIC METABOLIC PANEL
ANION GAP: 6 (ref 5–15)
BUN: 54 mg/dL — ABNORMAL HIGH (ref 6–20)
CO2: 20 mmol/L — AB (ref 22–32)
Calcium: 9.1 mg/dL (ref 8.9–10.3)
Chloride: 115 mmol/L — ABNORMAL HIGH (ref 101–111)
Creatinine, Ser: 3.64 mg/dL — ABNORMAL HIGH (ref 0.44–1.00)
GFR calc non Af Amer: 11 mL/min — ABNORMAL LOW (ref >60)
GFR, EST AFRICAN AMERICAN: 13 mL/min — AB (ref >60)
Glucose, Bld: 105 mg/dL — ABNORMAL HIGH (ref 65–99)
Potassium: 4.9 mmol/L (ref 3.5–5.1)
Sodium: 141 mmol/L (ref 135–145)

## 2014-09-04 LAB — GLUCOSE, CAPILLARY
GLUCOSE-CAPILLARY: 91 mg/dL (ref 70–99)
GLUCOSE-CAPILLARY: 104 mg/dL — AB (ref 70–99)
Glucose-Capillary: 126 mg/dL — ABNORMAL HIGH (ref 70–99)
Glucose-Capillary: 146 mg/dL — ABNORMAL HIGH (ref 70–99)

## 2014-09-04 NOTE — Care Management (Signed)
Admitted to [facility] with the diagnosis of hypotension. Lives alone. Daughter is Jenean LindauColetta [phone]). States she dis have home health a few months ago, "but didn't like them." No skilled facility as a resident, but ha worked in 2 in the past. Uses no aides for ambulation and takes care of all activities of daily living herself. Sees Dr. Darrick Huntsmanullo.  Last seen 2 weeks ago. No dialysis. States family will transport. Physical therapy is recommending skilled nursing facility. Gwenette GreetBrenda S Holland RN MSN Care Management [phone]

## 2014-09-04 NOTE — Progress Notes (Signed)
[facility] [place] [facility]   PATIENT NAME: [name]    MR#:  [id number]  DATE OF BIRTH:  01-06-1936  SUBJECTIVE:  CHIEF COMPLAINT:   Chief Complaint  Patient presents with  . Fatigue  Comes with weakness, noted to be hypotensive and also ARF on CKD stage 4. Fluids given adn BP meds held. Cr improving. Not back to baseline yet.  REVIEW OF SYSTEMS:  Review of Systems  Constitutional: Negative for fever and chills.  Respiratory: Negative for cough, shortness of breath and wheezing.   Cardiovascular: Negative for chest pain and palpitations.  Gastrointestinal: Positive for constipation. Negative for nausea, vomiting, abdominal pain and diarrhea.  Genitourinary: Negative for dysuria.  Neurological: Negative for dizziness, seizures and headaches.    DRUG ALLERGIES:   Allergies  Allergen Reactions  . Hydrocodone-Acetaminophen Nausea Only    Other reaction(s): Dizziness  . Morphine And Related Other (See Comments)    Makes patient feel crazy    VITALS:  Blood pressure 142/54, pulse 81, temperature 98.4 F (36.9 C), temperature source Oral, resp. rate 20, height 5\' 4"  (1.626 m), weight 87.317 kg (192 lb 8 oz), SpO2 99 %.  PHYSICAL EXAMINATION:  Physical Exam  GENERAL:  79 y.o.-year-old patient lying in the bed with no acute distress.  EYES: Pupils equal, round, reactive to light and accommodation. No scleral icterus. Extraocular muscles intact.  HEENT: Head atraumatic, normocephalic. Oropharynx and nasopharynx clear.  NECK:  Supple, no jugular venous distention. No thyroid enlargement, no tenderness.  LUNGS: Normal breath sounds bilaterally, no wheezing, rales,rhonchi or crepitation. No use of accessory muscles of respiration.  Decreased bibasilar breath sounds. CARDIOVASCULAR: S1, S2 normal. 3/6 systolic murmur present. No rubs, or gallops.  ABDOMEN: Soft, nontender, nondistended. Bowel sounds present. No organomegaly or mass.   EXTREMITIES: 2+ pedal edema present-chronic, no cyanosis, or clubbing.  NEUROLOGIC: Cranial nerves II through XII are intact. Muscle strength 5/5 in all extremities. Sensation intact. Gait not checked.  PSYCHIATRIC: The patient is alert and oriented x 3.  SKIN: No obvious rash, lesion, or ulcer.    LABORATORY PANEL:   CBC  Recent Labs Lab 09/03/14 0655  WBC 4.9  HGB 10.2*  HCT 30.0*  PLT 249   ------------------------------------------------------------------------------------------------------------------  Chemistries   Recent Labs Lab 09/04/14 0430  NA 141  K 4.9  CL 115*  CO2 20*  GLUCOSE 105*  BUN 54*  CREATININE 3.64*  CALCIUM 9.1   ------------------------------------------------------------------------------------------------------------------  Cardiac Enzymes  Recent Labs Lab 09/03/14 0947  TROPONINI <0.03   ------------------------------------------------------------------------------------------------------------------  RADIOLOGY:  Dg Chest 2 View  09/03/2014   CLINICAL DATA:  79 year old female with weakness and shortness of breath on exertion for years. Heartbeat felt in the ear. Former smoker. Hypertension. Initial encounter.  EXAM: CHEST  2 VIEW  COMPARISON:  01/06/2014, 06/10/2013 and 05/15/2013  FINDINGS: Chronic minimal peribronchial thickening.  No infiltrate, congestive heart failure or pneumothorax.  Heart size within normal limits.  IMPRESSION: No active cardiopulmonary disease.   Electronically Signed   By: Lacy DuverneySteven  Olson M.D.   On: 09/03/2014 07:55    EKG:   Orders placed or performed during the hospital encounter of 09/03/14  . EKG 12-Lead  . EKG 12-Lead    ASSESSMENT AND PLAN:   79 year old female with past medical history significant for CK D stage IV, baseline creatinine around 3.3, hypertension and diabetes mellitus and gastroesophageal reflux disease presented to the hospital secondary to weakness and was noted to be  hypotensive. She  was also noted to have acute renal failure.   #1 Acute on chronic kidney disease.- Creatinine on admission is 5.2. Likely secondary to ATN from hypotension. Her Lasix and valsartan are on hold. Continue to hold nephrotoxins. Gentle hydration given and creatinine improved to 3.6. Baseline creatinine is 3.3. Hold off on IV fluids for today and just monitor. Nephrology has been consulted.  #2 Hypotension-blood pressure is better today. Continue to hold Lasix and clonidine, atenolol and Diovan. Aldactone is being continued.  #3 Diabetes mellitus-70/30 insulin is on hold the cause her sugars or low normal. Continue to hold glipizide as well. On sliding scale insulin.  #4 Anemia of chronic disease-stable for now. Monitor.  #5 DVT prophylaxis-continue subcutaneous heparin.  #6 Disposition-physical therapy recommended rehabilitation. Patient is interested in going back home. She lives independently with her grandson helping her. Reevaluate physical therapy tomorrow. Might need walker and home health. Plan discussed with her sister-in-law at bedside. Care management consult for home health.  All the records are reviewed and case discussed with Care Management/Social Workerr. Management plans discussed with the patient, family and they are in agreement.  CODE STATUS: Full Code  TOTAL TIME TAKING CARE OF THIS PATIENT: 35 minutes.   POSSIBLE D/C IN 1-2 DAYS, DEPENDING ON CLINICAL CONDITION.   Enid BaasKALISETTI,Keiyon Plack M.D on 09/04/2014 at 11:11 AM  Between 7am to 6pm - Pager - [phone]  After 6pm go to [email or web address] - password [email or web address] [facility]  [facility]  Office  [phone]  CC: Primary care physician; Sherlene ShamsULLO, TERESA L, MD

## 2014-09-04 NOTE — Plan of Care (Signed)
Problem: Discharge Progression Outcomes Goal: Discharge plan in place and appropriate Outcome: Progressing Individualization to Care: History of CKD, DM, HTN, HLD, CHF, anxiety, chronic back pain and arthritis all controlled by home meds, although did admit with hypotension.

## 2014-09-04 NOTE — Progress Notes (Signed)
Physical Therapy Treatment Patient Details Name: [name] MRN: [id number] DOB: 10/23/1935 Today's Date: 09/04/2014    History of Present Illness 79 yo female with onset of  highly elevated creatinine and low BP was referred to PT to address needs for strength and endurance.  Lives alone in single level environment, PMHx:  CKD 3, DM, HTN, back pain, anemia, OA, CHF, GERD    PT Comments    Pt progressing well with functional mobility and is SBA to CGA with ambulation 200 feet with RW.  Pt did fairly well navigating with RW today.  Pt declined further activity during session d/t c/o fatigue.  Pt currently refusing SNF.  Anticipate pt would be able to progress to home with intermittent supervision and use of RW but pt currently refusing to use RW at home.  D/t this, recommend pt have 24/7 assist at home for all functional mobility for safety (pt plans to use her cane).   Follow Up Recommendations  Supervision/Assistance - 24 hour (Pt refusing SNF)     Equipment Recommendations  Rolling walker with 5" wheels (Pt refusing RW at this time)    Recommendations for Other Services       Precautions / Restrictions Precautions Precautions: Fall Restrictions Weight Bearing Restrictions: No    Mobility  Bed Mobility                  Transfers Overall transfer level: Needs assistance Equipment used: Rolling walker (2 wheeled) Transfers: Sit to/from BJ'sStand;Stand Pivot Transfers Sit to Stand: Supervision;Min guard Stand pivot transfers: Supervision;Min guard          Ambulation/Gait Ambulation/Gait assistance: Supervision;Min guard Ambulation Distance (Feet): 200 Feet Assistive device: Rolling walker (2 wheeled) Gait Pattern/deviations: WFL(Within Functional Limits)     General Gait Details: good navigation with RW   Stairs            Wheelchair Mobility    Modified Rankin (Stroke Patients Only)       Balance                                    Cognition Arousal/Alertness: Awake/alert Behavior During Therapy: WFL for tasks assessed/performed Overall Cognitive Status: Within Functional Limits for tasks assessed                      Exercises      General Comments  Pt educated on home safety, AD use, and fall prevention (see education section for details).      Pertinent Vitals/Pain Pain Assessment: No/denies pain  Vitals:  At rest HR 74 bpm and O2 99% on room air; after ambulation HR 77 bpm and O2 100% on room air.    Home Living                      Prior Function            PT Goals (current goals can now be found in the care plan section) Acute Rehab PT Goals Patient Stated Goal: to get home  PT Goal Formulation: With patient Time For Goal Achievement: 09/17/14 Potential to Achieve Goals: Good Progress towards PT goals: Progressing toward goals    Frequency  Min 2X/week    PT Plan Current plan remains appropriate    Co-evaluation             End of Session Equipment Utilized During Treatment:  Gait belt Activity Tolerance: Patient tolerated treatment well Patient left: in chair;with call bell/phone within reach;with chair alarm set     Time: [id number] PT Time Calculation (min) (ACUTE ONLY): 23 min  Charges:  $Gait Training: 8-22 mins $Therapeutic Exercise: 8-22 mins                    G CodesHendricks Limes:      Cadee Agro 09/04/2014, 4:52 PM Hendricks LimesEmily Adian Jablonowski, PT [phone]

## 2014-09-04 NOTE — Progress Notes (Signed)
Subjective:   Patient known to our practice from outpatient. She is followed for diabetic nephropathy Baseline Cr 3.38 in march 2016/ GFR 17 Presents with "feeling bad". Found to have worsening of creatinine  S Cr improved today, feels well, ambulatory  Objective:  Vital signs in last 24 hours:  Temp:  [98.4 F (36.9 C)] 98.4 F (36.9 C) (05/11 0506) Pulse Rate:  [75-81] 77 (05/11 1407) BP: (116-142)/(33-54) 128/36 mmHg (05/11 1407) SpO2:  [99 %-100 %] 100 % (05/11 1407)  Weight change: 0.227 kg (8 oz) Filed Weights   09/03/14 0642 09/03/14 1315  Weight: 87.091 kg (192 lb) 87.317 kg (192 lb 8 oz)    Intake/Output: I/O last 3 completed shifts: In: 1818 [P.O.:240; I.V.:1578] Out: 151 [Urine:150; Stool:1]   Intake/Output this shift:  Total I/O In: 240 [P.O.:240] Out: -   Physical Exam: General: NAD,   Head: Normocephalic, atraumatic. Moist oral mucosal membranes  Eyes: Anicteric,   Neck: Supple, trachea midline  Lungs:  Clear to auscultation  Heart: Regular rate and rhythm  Abdomen:  Soft, nontender,   Extremities:  + peripheral edema.  Neurologic: Nonfocal, moving all four extremities  Skin: No lesions  Access:     Basic Metabolic Panel:  Recent Labs Lab 09/03/14 0655 09/04/14 0430  NA 136 141  K 4.5 4.9  CL 104 115*  CO2 22 20*  GLUCOSE 105* 105*  BUN 68* 54*  CREATININE 5.29* 3.64*  CALCIUM 9.7 9.1    Liver Function Tests: No results for input(s): AST, ALT, ALKPHOS, BILITOT, PROT, ALBUMIN in the last 168 hours. No results for input(s): LIPASE, AMYLASE in the last 168 hours. No results for input(s): AMMONIA in the last 168 hours.  CBC:  Recent Labs Lab 09/03/14 0655  WBC 4.9  HGB 10.2*  HCT 30.0*  MCV 95.0  PLT 249    Cardiac Enzymes:  Recent Labs Lab 09/03/14 0655 09/03/14 0947  TROPONINI <0.03 <0.03    BNP: Invalid input(s): POCBNP  CBG:  Recent Labs Lab 09/03/14 1346 09/03/14 1626 09/03/14 2211 09/04/14 0718  09/04/14 1115  GLUCAP 124* 154* 147* 91 104*    Microbiology: Results for orders placed or performed in visit on 07/30/14  Urine culture     Status: None   Collection Time: 07/30/14  1:20 PM  Result Value Ref Range Status   Colony Count 15,000 COLONIES/ML  Final   Organism ID, Bacteria Multiple bacterial morphotypes present, none  Final   Organism ID, Bacteria predominant. Suggest appropriate recollection if   Final   Organism ID, Bacteria clinically indicated.  Final    Coagulation Studies: No results for input(s): LABPROT, INR in the last 72 hours.  Urinalysis:  Recent Labs  09/03/14 1041  COLORURINE STRAW*  LABSPEC 1.006  PHURINE 5.0  GLUCOSEU NEGATIVE  HGBUR 2+*  BILIRUBINUR NEGATIVE  KETONESUR NEGATIVE  PROTEINUR 30*  NITRITE NEGATIVE  LEUKOCYTESUR NEGATIVE      Imaging: Dg Chest 2 View  09/03/2014   CLINICAL DATA:  79 year old female with weakness and shortness of breath on exertion for years. Heartbeat felt in the ear. Former smoker. Hypertension. Initial encounter.  EXAM: CHEST  2 VIEW  COMPARISON:  01/06/2014, 06/10/2013 and 05/15/2013  FINDINGS: Chronic minimal peribronchial thickening.  No infiltrate, congestive heart failure or pneumothorax.  Heart size within normal limits.  IMPRESSION: No active cardiopulmonary disease.   Electronically Signed   By: Lacy DuverneySteven  Olson M.D.   On: 09/03/2014 07:55     Medications:   . sodium  chloride 100 mL/hr at 09/04/14 1205   . docusate sodium  100 mg Oral BID  . heparin  5,000 Units Subcutaneous 3 times per day  . insulin aspart  0-9 Units Subcutaneous TID WC  . pantoprazole  40 mg Oral Daily  . rosuvastatin  20 mg Oral Daily  . senna  1 tablet Oral BID  . spironolactone  50 mg Oral Daily   acetaminophen **OR** acetaminophen, ALPRAZolam, alum & mag hydroxide-simeth, ondansetron **OR** ondansetron (ZOFRAN) IV, polyethylene glycol  Assessment/ Plan:  79 y.o. African American  female with diabetes, congestive heart  failure, hypertension, peripheral vascular disease, chronic peripheral edema, Cardiac murmur and Bruits, CHF- Dr Juliann Paresallwood, leukopenia (constitutional)-evaluated by Dr. Sherrlyn HockPandit December 2011, Proteinuria 2.8 g by protein creatinine ratio, GOUT,   1. ARF , possibly from hypotension/ATN. improving 2. CKD st 4. Baseline Cr 3.38 in march 2016/ GFR 17 3. HTN - BP is well controlled at present. Agree with holding BP meds 4. Peripheral edema- Agree with holding diuretics and gentle hydration. Currently on spironolactone. K 4.9    LOS: 1 Ilya Neely 5/11/20163:11 PM

## 2014-09-04 NOTE — Plan of Care (Signed)
Problem: Discharge Progression Outcomes Goal: Discharge plan in place and appropriate Outcome: Progressing VSS,  No c/o pain this shift, prn med given x1 for anxiety  Bun and Creatinine improved

## 2014-09-04 NOTE — Clinical Social Work Note (Signed)
Clinical Social Work Assessment  Patient Details  Name: [name] MRN: [id number] Date of Birth: 08-Apr-1936  Date of referral:  09/04/14               Reason for consult:  Facility Placement                Permission sought to share information with:  Case Manager, Family Supports Permission granted to share information::  Yes, Verbal Permission Granted  Name::      family    Agency::   home health agency as needed  Relationship::   family   Contact Information:   na  Housing/Transportation Living arrangements for the past 2 months:  Apartment Source of Information:  Patient Patient Interpreter Needed:  None Criminal Activity/Legal Involvement Pertinent to Current Situation/Hospitalization:    Significant Relationships:  Adult Children, Other Family Members Lives with:  Self Do you feel safe going back to the place where you live?  Yes Need for family participation in patient care:  Yes (Comment) (family will assist with transportation home)  Care giving concerns: Pt lives alone, needs minimal assistance but believes that she will be fine at home alone with family checking in on her as needed.    Social Worker assessment / plan:  Clinical Social Worker was referred to Pt to assist with dc planning. Pt is single, has 2 adult children, and grandson involved in care. 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  Pt is engaged with CSW and aware of dc recommendations. Pt declines SNF at this time, Pt is agreeable to home health agency coming to the home. CSW updated RN CM and confirmed with Pt that family would be able [facility]  [facility] CSW signing off. Reconsult if needed.    Employment status:  Retired Health and safety inspectornsurance information:    PT Recommendations:  Skilled Teacher, early years/preursing Facility Information / Referral to community resources:  Other (Comment Required) (Home health agencies)  Patient/Family's Response to care:  Pt declines SNF, prefers home health. Pt  also states that she will take a walker home but will not use it. MD and CCSW both encouraged Pt to use recommended equipment to prevent falls. Pt's sister-in-law was at bedside and aware of Pt's plans to return home. [place] and wants to get out to see her before "anything happens." Pt stated that she is afraid something has already "happened" and family is not telling her. Pt's sister-in-law offered appropriate reassurance.     Patient/Family's Understanding of and Emotional Response to Diagnosis, Current Treatment, and Prognosis:  Pt's sister-in-law at bedside, supportive of Pt's wishes to return home.   Emotional Assessment Appearance:  Appears younger than stated age Attitude/Demeanor/Rapport:   (cooperative, engaged) Affect (typically observed):  Accepting Orientation:  Oriented to Self, Oriented to Place, Oriented to  Time, Oriented to Situation Alcohol / Substance use:  Never Used Psych involvement (Current and /or in the community):  No (Comment)  Discharge Needs  Concerns to be addressed:  Adjustment to Illness Readmission within the last 30 days:  No Current discharge risk:  Lives alone Barriers to Discharge:  No Barriers Identified   [name] Cardara N Joshawa Dubin, LCSW 09/04/2014, 11:34 AM

## 2014-09-05 LAB — BASIC METABOLIC PANEL
Anion gap: 6 (ref 5–15)
BUN: 35 mg/dL — AB (ref 6–20)
CO2: 20 mmol/L — ABNORMAL LOW (ref 22–32)
Calcium: 9.6 mg/dL (ref 8.9–10.3)
Chloride: 116 mmol/L — ABNORMAL HIGH (ref 101–111)
Creatinine, Ser: 2.65 mg/dL — ABNORMAL HIGH (ref 0.44–1.00)
GFR calc non Af Amer: 16 mL/min — ABNORMAL LOW (ref >60)
GFR, EST AFRICAN AMERICAN: 19 mL/min — AB (ref >60)
Glucose, Bld: 114 mg/dL — ABNORMAL HIGH (ref 65–99)
POTASSIUM: 4.5 mmol/L (ref 3.5–5.1)
SODIUM: 142 mmol/L (ref 135–145)

## 2014-09-05 LAB — GLUCOSE, CAPILLARY
GLUCOSE-CAPILLARY: 103 mg/dL — AB (ref 65–99)
Glucose-Capillary: 159 mg/dL — ABNORMAL HIGH (ref 65–99)

## 2014-09-05 MED ORDER — INSULIN ISOPHANE & REGULAR (HUMAN 70-30)100 UNIT/ML KWIKPEN: PEN_INJECTOR | SUBCUTANEOUS | Status: DC

## 2014-09-05 MED ORDER — ATENOLOL 25 MG PO TABS: ORAL_TABLET | ORAL | Status: DC

## 2014-09-05 MED ORDER — ATENOLOL 25 MG PO TABS
ORAL_TABLET | ORAL | Status: DC
Start: 2014-09-05 — End: 2014-09-05

## 2014-09-05 NOTE — Progress Notes (Signed)
   09/05/14 1400  Clinical Encounter Type  Visited With Patient and family together  Visit Type Initial  Referral From Nurse;Physician  Spiritual Encounters  Spiritual Needs Grief support;Prayer  Received page that patient's sister had just passed away and patient requested pastoral support and prayers.  I spoke to and prayed with patient and her daughter about patient's sister.  Patient and daughter both thanked me for being there for them.  Asbury Automotive GroupChaplain Kylin Dubs-pager [phone]

## 2014-09-05 NOTE — Plan of Care (Signed)
Problem: Discharge Progression Outcomes Goal: Discharge plan in place and appropriate Individualization.  Outcome: Progressing Pt is a high fall risk. Offer toileting qx1hr with safety checks. 1xassist to the bathroom. H/O DM type II, HTN, GERD, constipation controlled with home meds. Goal: Other Discharge Outcomes/Goals Outcome: Progressing Plan of care progress to goals: BP/HR stable. IVF infusing. Up to the bathroom with 1xassist. No/c/o n/v. Denies pain.

## 2014-09-05 NOTE — Plan of Care (Addendum)
Problem: Discharge Progression Outcomes Goal: Other Discharge Outcomes/Goals Outcome: Adequate for Discharge Patient has no c/o pain this shift Patients v/s are stable  Patient Bun and Creatine have improved  Patient is going to be discharged to home

## 2014-09-05 NOTE — Care Management (Signed)
Spoke with Dr. Nemiah CommanderKalisetti. States she will be discharging Ms. Wien to home today. Will need home health and physical therapy in the home. Spoke with Ms. Demarinis and daughter, Ms Andrey CampanileWilson at the bedside. Would like [facility]. Donnal DebarBarbara Taylor, RN representative for [facility] updated. Daughter will be transporting. Personnel Care list given to Ms. Barbeau. Gwenette GreetBrenda S Holland RN MSN Care Management [phone]

## 2014-09-05 NOTE — Progress Notes (Signed)
Subjective:   Patient known to our practice from outpatient. She is followed for diabetic nephropathy Baseline Cr 3.38 in march 2016/ GFR 17 Presents with "feeling bad". Found to have worsening of creatinine  S Cr improved today, feels well, ambulatory No SOB GFR 19  Objective:  Vital signs in last 24 hours:  Temp:  [98 F (36.7 C)-98.5 F (36.9 C)] 98.3 F (36.8 C) (05/12 1325) Pulse Rate:  [78-88] 84 (05/12 1325) Resp:  [18] 18 (05/12 0927) BP: (111-159)/(30-48) 159/39 mmHg (05/12 1325) SpO2:  [99 %-100 %] 100 % (05/12 1325)  Weight change:  Filed Weights   09/03/14 0642 09/03/14 1315  Weight: 87.091 kg (192 lb) 87.317 kg (192 lb 8 oz)    Intake/Output: I/O last 3 completed shifts: In: 2689 [P.O.:720; I.V.:1969] Out: 1301 [Urine:1300; Stool:1]   Intake/Output this shift:  Total I/O In: 240 [P.O.:240] Out: 275 [Urine:275]  Physical Exam: General: NAD,   Head: Normocephalic, atraumatic. Moist oral mucosal membranes  Eyes: Anicteric,   Neck: Supple, trachea midline  Lungs:  Clear to auscultation  Heart: Regular rate and rhythm  Abdomen:  Soft, nontender,   Extremities:  + peripheral edema.  Neurologic: Nonfocal, moving all four extremities  Skin: No lesions  Access:     Basic Metabolic Panel:  Recent Labs Lab 09/03/14 0655 09/04/14 0430 09/05/14 0455  NA 136 141 142  K 4.5 4.9 4.5  CL 104 115* 116*  CO2 22 20* 20*  GLUCOSE 105* 105* 114*  BUN 68* 54* 35*  CREATININE 5.29* 3.64* 2.65*  CALCIUM 9.7 9.1 9.6    Liver Function Tests: No results for input(s): AST, ALT, ALKPHOS, BILITOT, PROT, ALBUMIN in the last 168 hours. No results for input(s): LIPASE, AMYLASE in the last 168 hours. No results for input(s): AMMONIA in the last 168 hours.  CBC:  Recent Labs Lab 09/03/14 0655  WBC 4.9  HGB 10.2*  HCT 30.0*  MCV 95.0  PLT 249    Cardiac Enzymes:  Recent Labs Lab 09/03/14 0655 09/03/14 0947  TROPONINI <0.03 <0.03    BNP: Invalid  input(s): POCBNP  CBG:  Recent Labs Lab 09/04/14 1115 09/04/14 1611 09/04/14 2201 09/05/14 0714 09/05/14 1114  GLUCAP 104* 126* 146* 103* 159*    Microbiology: Results for orders placed or performed in visit on 07/30/14  Urine culture     Status: None   Collection Time: 07/30/14  1:20 PM  Result Value Ref Range Status   Colony Count 15,000 COLONIES/ML  Final   Organism ID, Bacteria Multiple bacterial morphotypes present, none  Final   Organism ID, Bacteria predominant. Suggest appropriate recollection if   Final   Organism ID, Bacteria clinically indicated.  Final    Coagulation Studies: No results for input(s): LABPROT, INR in the last 72 hours.  Urinalysis:  Recent Labs  09/03/14 1041  COLORURINE STRAW*  LABSPEC 1.006  PHURINE 5.0  GLUCOSEU NEGATIVE  HGBUR 2+*  BILIRUBINUR NEGATIVE  KETONESUR NEGATIVE  PROTEINUR 30*  NITRITE NEGATIVE  LEUKOCYTESUR NEGATIVE      Imaging: No results found.   Medications:   . sodium chloride 100 mL/hr at 09/04/14 2146   . docusate sodium  100 mg Oral BID  . heparin  5,000 Units Subcutaneous 3 times per day  . insulin aspart  0-9 Units Subcutaneous TID WC  . pantoprazole  40 mg Oral Daily  . rosuvastatin  20 mg Oral Daily  . senna  1 tablet Oral BID  . spironolactone  50 mg  Oral Daily   acetaminophen **OR** acetaminophen, ALPRAZolam, alum & mag hydroxide-simeth, ondansetron **OR** ondansetron (ZOFRAN) IV, polyethylene glycol  Assessment/ Plan:  79 y.o. African American  female with diabetes, congestive heart failure, hypertension, peripheral vascular disease, chronic peripheral edema, Cardiac murmur and Bruits, CHF- Dr Juliann Paresallwood, leukopenia (constitutional)-evaluated by Dr. Sherrlyn HockPandit December 2011, Proteinuria 2.8 g by protein creatinine ratio, GOUT,   1. ARF , possibly from hypotension/ATN. improved 2. CKD st 4. Cr at Baseline2.65 GFR 19 3. HTN - BP is well controlled at present.  4. Peripheral edema- going home on  lasix 40 BID    LOS: 2 Amer Alcindor 5/12/20162:29 PM

## 2014-09-05 NOTE — Discharge Summary (Signed)
[facility] [place] NAME: [name]    MR#:  [id number]  DATE OF BIRTH:  1936-04-02  DATE OF ADMISSION:  09/03/2014 ADMITTING PHYSICIAN: Fritzi Mandes, MD  DATE OF DISCHARGE: No discharge date for patient encounter.  PRIMARY CARE PHYSICIAN: Crecencio Mc, MD    ADMISSION DIAGNOSIS:  Renal failure (ARF), acute on chronic [N17.9, N18.9] Chest pain, unspecified chest pain type [R07.9]  DISCHARGE DIAGNOSIS:  Principal Problem:   Hypotension  acute renal failure  SECONDARY DIAGNOSIS:   Past Medical History  Diagnosis Date  . Diabetes mellitus   . GERD (gastroesophageal reflux disease)   . Hypertension   . Anemia   . Arthritis   . Hemorrhoids   . Hypertension   . CHF (congestive heart failure)    chronic kidney disease stage IV baseline creatinine of 3.3  HOSPITAL COURSE:  79 year old female with past medical history significant for CK D stage IV, baseline creatinine around 3.3, hypertension and diabetes mellitus and gastroesophageal reflux disease presented to the hospital secondary to weakness and was noted to be hypotensive. She was also noted to have acute renal failure.   #1 Acute on chronic kidney disease.- Creatinine on admission is 5.2. Likely secondary to ATN from hypotension. Her Lasix and valsartan were held. Continue to hold nephrotoxins. Baseline creatinine is 3.3. Nephrology has been consulted. Serum creatinine today is 2.6.  #2 Hypotension-blood pressure is better today. Blood pressure is elevated today. Will restart her Diovan and atenolol and clonidine. Continue to hold Lasix at this time. Aldactone is being continued.  #3 Diabetes mellitus-70/30 insulin is on hold the cause her sugars or low normal. Continue to hold glipizide as well. On sliding scale insulin. Adjust her insulin dose at the time of discharge.  #4 Anemia of chronic disease-stable for now. Monitor.  #5 DVT prophylaxis-continue  subcutaneous heparin.  #6 Disposition-patient will be discharged home today with home health.  DISCHARGE CONDITIONS:   Stable  CONSULTS OBTAINED:  Treatment Team:  Murlean Iba, MD  DRUG ALLERGIES:   Allergies  Allergen Reactions  . Hydrocodone-Acetaminophen Nausea Only    Other reaction(s): Dizziness  . Morphine And Related Other (See Comments)    Makes patient feel crazy    DISCHARGE MEDICATIONS:   Current Discharge Medication List    CONTINUE these medications which have CHANGED   Details  atenolol (TENORMIN) 25 MG tablet Take 1 tab PO every day Qty: 30 tablet, Refills: 0    Insulin Isophane & Regular Human (HUMULIN 70/30 KWIKPEN) (70-30) 100 UNIT/ML PEN Take 10 units Twice a day subcutaneously Qty: 15 mL, Refills: 11   Associated Diagnoses: Type II diabetes mellitus with renal manifestations, uncontrolled      CONTINUE these medications which have NOT CHANGED   Details  ALPRAZolam (XANAX) 0.5 MG tablet TAKE ONE TABLET TWICE DAILY AS NEEDED FOR ANXIETY Qty: 60 tablet, Refills: 2    aspirin EC 81 MG tablet Take 81 mg by mouth daily.    Blood Glucose Monitoring Suppl (ONE TOUCH ULTRA SYSTEM KIT) W/DEVICE KIT 1 kit by Does not apply route once. Qty: 1 each, Refills: 0    cloNIDine (CATAPRES) 0.1 MG tablet Take 0.1 mg by mouth as needed.     CRESTOR 20 MG tablet TAKE ONE (1) TABLET EACH DAY Qty: 30 tablet, Refills: 5    glucose blood (ONE TOUCH ULTRA TEST) test strip Check sugar 2-3 times daily Dx 250.42 Qty: 100 each, Refills: 5  lactulose (CHRONULAC) 10 GM/15ML solution Take 15 mLs (10 g total) by mouth once a week. As needed for constipation Qty: 240 mL, Refills: 1    NEXIUM 40 MG capsule TAKE ONE (1) CAPSULE EACH DAY BEFORE BREAKFAST Qty: 30 capsule, Refills: 11    spironolactone (ALDACTONE) 50 MG tablet TAKE ONE (1) TABLET EACH DAY Qty: 30 tablet, Refills: 5    ULORIC 40 MG tablet TAKE ONE (1) TABLET EACH DAY Qty: 30 tablet, Refills: 5       STOP taking these medications     furosemide (LASIX) 40 MG tablet      glipiZIDE (GLUCOTROL) 10 MG tablet      valsartan (DIOVAN) 320 MG tablet          DISCHARGE INSTRUCTIONS:    If you experience worsening of your admission symptoms, develop shortness of breath, life threatening emergency, suicidal or homicidal thoughts you must seek medical attention immediately by calling 911 or calling your MD immediately  if symptoms less severe.  You Must read complete instructions/literature along with all the possible adverse reactions/side effects for all the Medicines you take and that have been prescribed to you. Take any new Medicines after you have completely understood and accept all the possible adverse reactions/side effects.   Please note  You were cared for by a hospitalist during your hospital stay. If you have any questions about your discharge medications or the care you received while you were in the hospital after you are discharged, you can call the unit and asked to speak with the hospitalist on call if the hospitalist that took care of you is not available. Once you are discharged, your primary care physician will handle any further medical issues. Please note that NO REFILLS for any discharge medications will be authorized once you are discharged, as it is imperative that you return to your primary care physician (or establish a relationship with a primary care physician if you do not have one) for your aftercare needs so that they can reassess your need for medications and monitor your lab values.    Today   CHIEF COMPLAINT:   Chief Complaint  Patient presents with  . Fatigue    VITAL SIGNS:  Blood pressure 159/39, pulse 84, temperature 98.3 F (36.8 C), temperature source Oral, resp. rate 18, height _0  (1.626 m), weight 87.317 kg (192 lb 8 oz), SpO2 100 %.  I/O:   Intake/Output Summary (Last 24 hours) at 09/05/14 1453 Last data filed at 09/05/14 0900  Gross  per 24 hour  Intake   1480 ml  Output   1425 ml  Net     55 ml    PHYSICAL EXAMINATION:   Physical Exam  GENERAL:  79 y.o.-year-old patient lying in the bed with no acute distress.  EYES: Pupils equal, round, reactive to light and accommodation. No scleral icterus. Extraocular muscles intact.  HEENT: Head atraumatic, normocephalic. Oropharynx and nasopharynx clear.  NECK:  Supple, no jugular venous distention. No thyroid enlargement, no tenderness.  LUNGS: Normal breath sounds bilaterally, no wheezing, rales,rhonchi or crepitation. No use of accessory muscles of respiration.  CARDIOVASCULAR: S1, S2 normal. No murmurs, rubs, or gallops.  ABDOMEN: Soft, non-tender, non-distended. Bowel sounds present. No organomegaly or mass.  EXTREMITIES: No pedal edema, cyanosis, or clubbing.  NEUROLOGIC: Cranial nerves II through XII are intact. Muscle strength 5/5 in all extremities. Sensation intact. Gait not checked.  PSYCHIATRIC: The patient is alert and oriented x 3.  SKIN: No obvious  rash, lesion, or ulcer.   DATA REVIEW:   CBC  Recent Labs Lab 09/03/14 0655  WBC 4.9  HGB 10.2*  HCT 30.0*  PLT 249    Chemistries   Recent Labs Lab 09/05/14 0455  NA 142  K 4.5  CL 116*  CO2 20*  GLUCOSE 114*  BUN 35*  CREATININE 2.65*  CALCIUM 9.6    Cardiac Enzymes  Recent Labs Lab 09/03/14 0947  TROPONINI <0.03    Microbiology Results  Results for orders placed or performed in visit on 07/30/14  Urine culture     Status: None   Collection Time: 07/30/14  1:20 PM  Result Value Ref Range Status   Colony Count 15,000 COLONIES/ML  Final   Organism ID, Bacteria Multiple bacterial morphotypes present, none  Final   Organism ID, Bacteria predominant. Suggest appropriate recollection if   Final   Organism ID, Bacteria clinically indicated.  Final    RADIOLOGY:  No results found.  EKG:   Orders placed or performed during the hospital encounter of 09/03/14  . EKG 12-Lead  . EKG  12-Lead      Management plans discussed with the patient, family and they are in agreement.  CODE STATUS:     Code Status Orders        Start     Ordered   09/03/14 1301  Full code   Continuous     09/03/14 1301      TOTAL TIME TAKING CARE OF THIS PATIENT: 40 minutes.    Gladstone Lighter M.D on 09/05/2014 at 2:53 PM  Between 7am to 6pm - Pager - [phone]  After 6pm go to [email or web address] - password [email or web address] [facility]  Office  [phone]  CC: Primary care physician; Crecencio Mc, MD

## 2014-09-05 NOTE — Progress Notes (Signed)
[facility] [place] [facility]   PATIENT NAME: Ronald LoboMertis Lacks    MR#:  [id number]  DATE OF BIRTH:  1935/08/04  SUBJECTIVE:  CHIEF COMPLAINT:   Chief Complaint  Patient presents with  . Fatigue   appears depressed and emotional as she heard about her sister passing away this morning. Labs are improving blood pressure is better. Possible discharge today.  REVIEW OF SYSTEMS:  Review of Systems  Constitutional: Negative for fever and chills.  Respiratory: Negative for cough, shortness of breath and wheezing.   Cardiovascular: Negative for chest pain and palpitations.  Gastrointestinal: Negative for nausea, vomiting, abdominal pain, diarrhea and constipation.  Genitourinary: Negative for dysuria.  Neurological: Negative for dizziness, seizures and headaches.    DRUG ALLERGIES:   Allergies  Allergen Reactions  . Hydrocodone-Acetaminophen Nausea Only    Other reaction(s): Dizziness  . Morphine And Related Other (See Comments)    Makes patient feel crazy    VITALS:  Blood pressure 159/39, pulse 84, temperature 98.3 F (36.8 C), temperature source Oral, resp. rate 18, height 5\' 4"  (1.626 m), weight 87.317 kg (192 lb 8 oz), SpO2 100 %.  PHYSICAL EXAMINATION:  Physical Exam  GENERAL:  79 y.o.-year-old patient lying in the bed with no acute distress.  EYES: Pupils equal, round, reactive to light and accommodation. No scleral icterus. Extraocular muscles intact.  HEENT: Head atraumatic, normocephalic. Oropharynx and nasopharynx clear.  NECK:  Supple, no jugular venous distention. No thyroid enlargement, no tenderness.  LUNGS: Normal breath sounds bilaterally, no wheezing, rales,rhonchi or crepitation. No use of accessory muscles of respiration.  Decreased bibasilar breath sounds. CARDIOVASCULAR: S1, S2 normal. 3/6 systolic murmur present. No rubs, or gallops.  ABDOMEN: Soft, nontender, nondistended. Bowel sounds present. No organomegaly or mass.   EXTREMITIES: 2+ pedal edema present-chronic, no cyanosis, or clubbing.  NEUROLOGIC: Cranial nerves II through XII are intact. Muscle strength 5/5 in all extremities. Sensation intact. Gait not checked.  PSYCHIATRIC: The patient is alert and oriented x 3.  SKIN: No obvious rash, lesion, or ulcer.    LABORATORY PANEL:   CBC  Recent Labs Lab 09/03/14 0655  WBC 4.9  HGB 10.2*  HCT 30.0*  PLT 249   ------------------------------------------------------------------------------------------------------------------  Chemistries   Recent Labs Lab 09/05/14 0455  NA 142  K 4.5  CL 116*  CO2 20*  GLUCOSE 114*  BUN 35*  CREATININE 2.65*  CALCIUM 9.6   ------------------------------------------------------------------------------------------------------------------  Cardiac Enzymes  Recent Labs Lab 09/03/14 0947  TROPONINI <0.03   ------------------------------------------------------------------------------------------------------------------  RADIOLOGY:  No results found.  EKG:   Orders placed or performed during the hospital encounter of 09/03/14  . EKG 12-Lead  . EKG 12-Lead    ASSESSMENT AND PLAN:   79 year old female with past medical history significant for CK D stage IV, baseline creatinine around 3.3, hypertension and diabetes mellitus and gastroesophageal reflux disease presented to the hospital secondary to weakness and was noted to be hypotensive. She was also noted to have acute renal failure.   #1 Acute on chronic kidney disease.- Creatinine on admission is 5.2. Likely secondary to ATN from hypotension. Her Lasix and valsartan were held. Continue to hold nephrotoxins. Baseline creatinine is 3.3. Nephrology has been consulted. Serum creatinine today is 2.6.  #2 Hypotension-blood pressure is better today. Blood pressure is elevated today. Will restart her Diovan and atenolol and clonidine. Continue to hold Lasix at this time.  Aldactone is being  continued.  #3 Diabetes mellitus-70/30 insulin is on hold the  cause her sugars or low normal. Continue to hold glipizide as well. On sliding scale insulin. Adjust her insulin dose at the time of discharge.  #4 Anemia of chronic disease-stable for now. Monitor.  #5 DVT prophylaxis-continue subcutaneous heparin.  #6 Disposition-patient will be discharged home today with home health.   All the records are reviewed and case discussed with Care Management/Social Workerr. Management plans discussed with the patient, family and they are in agreement.  CODE STATUS: Full Code  TOTAL TIME TAKING CARE OF THIS PATIENT: 35 minutes.   POSSIBLE D/C IN 1-2 DAYS, DEPENDING ON CLINICAL CONDITION.   Enid BaasKALISETTI,Udell Mazzocco M.D on 09/05/2014 at 2:01 PM  Between 7am to 6pm - Pager - [phone]  After 6pm go to [email or web address] - password [email or web address] [facility]  [facility]  Office  [phone]  CC: Primary care physician; Sherlene ShamsULLO, TERESA L, MD

## 2014-09-06 ENCOUNTER — Telehealth: Payer: Self-pay | Admitting: *Deleted

## 2014-09-06 NOTE — Telephone Encounter (Signed)
TCM call completed. Please advise if any further instructions until her appt 09/11/14: Any patient concerns? Pt declined to stop taking her glipizide, valsartan, and furosemide as advised at discharge. She would like to see Dr. Darrick Huntsmanullo first before changing any medications  Also had 70/30 insulin dose decreased, but actually has not taken any insulin since discharge since her sugars have been running a little lower. She states she will continue to use the previous dose instructed by Dr. Darrick Huntsmanullo 15 units in the AM and 20 units in the PM if insulin is needed. Fasting Sugar last night before dinner was 123. Fasting sugar this AM was 103. Pt has not checked her BP since discharge. Requested she check it while I was on the phone with her. 143/65 HR 70. She has continued taking her valsartan and furosemide.

## 2014-09-06 NOTE — Telephone Encounter (Addendum)
Discharge Date: 09/05/14  Transition Care Management Follow-up Telephone Call  How have you been since you were released from the hospital? Pt states she is doing ok, just has some sinus drainage. She is sad because her sister has passed away and will be going out of town for service.    Do you understand why you were in the hospital? YES, ARF, hypotension   Do you understand the discharge instructions? YES, but declines to change any of her medications until she sees Dr. Darrick Huntsmanullo.   Items Reviewed:  Medications reviewed: YES Pt was advised to stop Glipizide, Furosemide, valsartan at discharge, however she has continued to take them until she sees Dr. Darrick Huntsmanullo. Also has not changed her insulin dose. See below message sent to Dr. Darrick Huntsmanullo.  Allergies reviewed: YES, no new drug allergies  Dietary changes reviewed: YES low sodium heart healthy  Referrals reviewed: YES  HH referral   Functional Questionnaire:   Activities of Daily Living (ADLs):  She states they are independent in the following: All States they require assistance with the following: None   Any transportation issues/concerns?: NO, daughter brings her to her appointments. Daughter spoke with Kenney Housemananya and was notified of appt also.    Any patient concerns? Pt declined to stop taking her glipizide, valsartan, and furosemide as advised at discharge. She would like to see Dr. Darrick Huntsmanullo first before changing any medications  Also had 70/30 insulin dose decreased, but actually has not taken any insulin since discharge since her sugars have been running a little lower. She states she will continue to use the previous dose instructed by Dr. Darrick Huntsmanullo 15 units in the AM and 20 units in the PM if insulin is needed. Fasting Sugar last night before dinner was 123. Fasting sugar this AM was 103. Pt has not checked her BP since discharge. Requested she check it while I was on the phone with her. 143/65 HR 70. She has continued taking her  valsartan and furosemide.    Confirmed importance and date/time of follow-up visits scheduled: YES. Advised pt to call Dr. Doristine ChurchSingh's office to schedule a follow up visit, provided phone number to pt. Scheduled her an appt with Dr. Darrick Huntsmanullo 09/11/14 @ 9:00. Pt's daughter was also notified by [place].   Confirmed with patient if condition begins to worsen call PCP or go to the ER. Patient was given the Call-a-Nurse line [phone]: YES

## 2014-09-06 NOTE — Telephone Encounter (Signed)
Spoke with the patients daughter to confirm follow up appointment and to follow the discharge instructions from the hospital and Dr. Darrick Huntsmanullo will reevaluate patient at her appointment.  Daughter had called the triage line for advice.

## 2014-09-07 ENCOUNTER — Emergency Department: Payer: Medicare Other

## 2014-09-07 ENCOUNTER — Emergency Department
Admission: EM | Admit: 2014-09-07 | Discharge: 2014-09-07 | Disposition: A | Discharge status: Home / Self Care | Payer: Medicare Other | Attending: Emergency Medicine | Admitting: Emergency Medicine

## 2014-09-07 DIAGNOSIS — Z7982 Long term (current) use of aspirin: Secondary | ICD-10-CM | POA: Insufficient documentation

## 2014-09-07 DIAGNOSIS — Z87891 Personal history of nicotine dependence: Secondary | ICD-10-CM | POA: Diagnosis not present

## 2014-09-07 DIAGNOSIS — I10 Essential (primary) hypertension: Secondary | ICD-10-CM | POA: Diagnosis not present

## 2014-09-07 DIAGNOSIS — Z79899 Other long term (current) drug therapy: Secondary | ICD-10-CM | POA: Diagnosis not present

## 2014-09-07 DIAGNOSIS — R0600 Dyspnea, unspecified: Secondary | ICD-10-CM | POA: Diagnosis not present

## 2014-09-07 DIAGNOSIS — E119 Type 2 diabetes mellitus without complications: Secondary | ICD-10-CM | POA: Insufficient documentation

## 2014-09-07 DIAGNOSIS — Z794 Long term (current) use of insulin: Secondary | ICD-10-CM | POA: Diagnosis not present

## 2014-09-07 DIAGNOSIS — R0602 Shortness of breath: Secondary | ICD-10-CM | POA: Diagnosis present

## 2014-09-07 LAB — BRAIN NATRIURETIC PEPTIDE: B NATRIURETIC PEPTIDE 5: 79 pg/mL (ref 0.0–100.0)

## 2014-09-07 LAB — BASIC METABOLIC PANEL
ANION GAP: 9 (ref 5–15)
BUN: 26 mg/dL — AB (ref 6–20)
CHLORIDE: 107 mmol/L (ref 101–111)
CO2: 23 mmol/L (ref 22–32)
Calcium: 10.6 mg/dL — ABNORMAL HIGH (ref 8.9–10.3)
Creatinine, Ser: 2.64 mg/dL — ABNORMAL HIGH (ref 0.44–1.00)
GFR calc Af Amer: 19 mL/min — ABNORMAL LOW (ref >60)
GFR calc non Af Amer: 16 mL/min — ABNORMAL LOW (ref >60)
Glucose, Bld: 136 mg/dL — ABNORMAL HIGH (ref 65–99)
Potassium: 4.1 mmol/L (ref 3.5–5.1)
SODIUM: 139 mmol/L (ref 135–145)

## 2014-09-07 LAB — CBC
HCT: 31.6 % — ABNORMAL LOW (ref 35.0–47.0)
Hemoglobin: 11.1 g/dL — ABNORMAL LOW (ref 12.0–16.0)
MCH: 33.2 pg (ref 26.0–34.0)
MCHC: 35.1 g/dL (ref 32.0–36.0)
MCV: 94.6 fL (ref 80.0–100.0)
Platelets: 243 10*3/uL (ref 150–440)
RBC: 3.34 MIL/uL — AB (ref 3.80–5.20)
RDW: 12.8 % (ref 11.5–14.5)
WBC: 4.4 10*3/uL (ref 3.6–11.0)

## 2014-09-07 LAB — TROPONIN I

## 2014-09-07 NOTE — ED Provider Notes (Signed)
[facility] Emergency Department Provider Note  ____________________________________________  Time seen: Approximately 6:19 AM  I have reviewed the triage vital signs and the nursing notes.   HISTORY  Chief Complaint Shortness of Breath    HPI [name] is a 79 y.o. female who presents via EMS for shortness of breath. Patient was discharged from the hospital yesterday; admitted for kidney failure and treated with IV fluid resuscitation. Patient states she wasgetting ready for bed and experienced mild shortness of breath without chest pain. Dyspnea is not affected by positioning. States she was anxious and upset due to the recent death of her sister and her upcoming funeral today. Denies sweating, nausea, vomiting, diarrhea. States BLE swelling is chronic. Denies recent travel/surgery.   Past Medical History  Diagnosis Date  . Diabetes mellitus   . GERD (gastroesophageal reflux disease)   . Hypertension   . Anemia   . Arthritis   . Hemorrhoids   . Hypertension   . CHF (congestive heart failure)     Patient Active Problem List   Diagnosis Date Noted  . Hypotension 09/03/2014  . Dysuria 08/07/2014  . Hyponatremia 01/19/2014  . Arm pain, left 09/23/2013  . Back pain of thoracolumbar region 09/23/2013  . Visit for preventive health examination 08/18/2013  . Laceration of ear, right, simple 08/18/2013  . Obesity, unspecified 07/07/2013  . Depression with anxiety 06/26/2013  . Hypomagnesemia 05/20/2013  . Acute renal failure 05/17/2013  . Viral URI 05/13/2013  . Hemorrhoids 11/06/2012  . Hidradenitis suppurativa 10/05/2012  . Unspecified hypothyroidism 07/24/2012  . Cognitive changes 01/18/2012  . Back pain, thoracic 11/27/2011  . Venous insufficiency of leg 11/24/2011  . Type 2 diabetes, uncontrolled, with renal manifestation 10/03/2011  . Anemia of chronic renal failure 10/03/2011  . Chest pain with normal coronary angiography 04/21/2011  .  Screening for colon cancer 04/12/2011  . Screening for breast cancer 04/12/2011  . Hypertension 04/12/2011  . Hyperlipidemia LDL goal < 100 04/12/2011  . Screening for osteoporosis 04/12/2011    Past Surgical History  Procedure Laterality Date  . Cholecystectomy    . Abdominal hysterectomy    . Back surgery    . Arthroplasty w/ arthroscopy medial / lateral compartment knee  Feb 2011    [name]  . Hemorrhoid surgery  12/19/12    Current Outpatient Rx  Name  Route  Sig  Dispense  Refill  . ALPRAZolam (XANAX) 0.5 MG tablet      TAKE ONE TABLET TWICE DAILY AS NEEDED FOR ANXIETY   60 tablet   2   . aspirin EC 81 MG tablet   Oral   Take 81 mg by mouth daily.         Marland Kitchen atenolol (TENORMIN) 25 MG tablet      Take 1 tab PO every day   30 tablet   0   . Blood Glucose Monitoring Suppl (ONE TOUCH ULTRA SYSTEM KIT) W/DEVICE KIT   Does not apply   1 kit by Does not apply route once.   1 each   0     Dx: 250.42   . cloNIDine (CATAPRES) 0.1 MG tablet   Oral   Take 0.1 mg by mouth as needed.          . CRESTOR 20 MG tablet      TAKE ONE (1) TABLET EACH DAY   30 tablet   5   . glucose blood (ONE TOUCH ULTRA TEST) test strip  Check sugar 2-3 times daily Dx 250.42   100 each   5   . Insulin Isophane & Regular Human (HUMULIN 70/30 KWIKPEN) (70-30) 100 UNIT/ML PEN      Take 10 units Twice a day subcutaneously   15 mL   11   . lactulose (CHRONULAC) 10 GM/15ML solution   Oral   Take 15 mLs (10 g total) by mouth once a week. As needed for constipation   240 mL   1   . NEXIUM 40 MG capsule      TAKE ONE (1) CAPSULE EACH DAY BEFORE BREAKFAST   30 capsule   11   . spironolactone (ALDACTONE) 50 MG tablet      TAKE ONE (1) TABLET EACH DAY   30 tablet   5   . ULORIC 40 MG tablet      TAKE ONE (1) TABLET EACH DAY   30 tablet   5     Allergies Hydrocodone-acetaminophen and Morphine and related  Family History  Problem Relation Age of Onset  . Heart  disease Mother   . Hypertension Mother   . Cancer Mother     Social History History  Substance Use Topics  . Smoking status: Former Smoker -- 30 years    Types: Cigarettes    Quit date: 09/28/2001  . Smokeless tobacco: Current User    Types: Snuff  . Alcohol Use: No    Review of Systems Constitutional: No fever/chills Eyes: No visual changes. ENT: No sore throat. Cardiovascular: Denies chest pain. Respiratory: Positive for shortness of breath. Gastrointestinal: No abdominal pain.  No nausea, no vomiting.  No diarrhea.  No constipation. Genitourinary: Negative for dysuria. Musculoskeletal: Negative for back pain. Skin: Negative for rash. Neurological: Negative for headaches, focal weakness or numbness.  10-point ROS otherwise negative.  ____________________________________________   PHYSICAL EXAM:  VITAL SIGNS: ED Triage Vitals  Enc Vitals Group     BP 09/07/14 0344 161/55 mmHg     Pulse Rate 09/07/14 0344 72     Resp 09/07/14 0344 18     Temp 09/07/14 0344 98.1 F (36.7 C)     Temp Source 09/07/14 0344 Oral     SpO2 09/07/14 0344 100 %     Weight 09/07/14 0344 192 lb (87.091 kg)     Height 09/07/14 0344 _0  (1.626 m)     Head Cir --      Peak Flow --      Pain Score 09/07/14 0346 0     Pain Loc --      Pain Edu? --      Excl. in [place]? --     Constitutional: Alert and oriented. Well appearing and in no acute distress. Eyes: Conjunctivae are normal. PERRL. EOMI. Head: Atraumatic. Nose: No congestion/rhinnorhea. Mouth/Throat: Mucous membranes are moist.  Oropharynx non-erythematous. Neck: No stridor.   Cardiovascular: Normal rate, regular rhythm. Grossly normal heart sounds.  Good peripheral circulation. Respiratory: Normal respiratory effort.  No retractions. Lungs slightly diminished bibasilar; otherwise CTAB. Gastrointestinal: Soft and nontender. No distention. No abdominal bruits. No CVA tenderness. Musculoskeletal: No lower extremity tenderness. 2+  nonpitting BLE edema. No joint effusions. Neurologic:  Normal speech and language. No gross focal neurologic deficits are appreciated. Speech is normal. No gait instability. Skin:  Skin is warm, dry and intact. No rash noted. Psychiatric: Mood and affect are normal. Speech and behavior are normal.  ____________________________________________   LABS (all labs ordered are listed, but only abnormal results are displayed)  Labs Reviewed  CBC - Abnormal; Notable for the following:    RBC 3.34 (*)    Hemoglobin 11.1 (*)    HCT 31.6 (*)    All other components within normal limits  BASIC METABOLIC PANEL - Abnormal; Notable for the following:    Glucose, Bld 136 (*)    BUN 26 (*)    Creatinine, Ser 2.64 (*)    Calcium 10.6 (*)    GFR calc non Af Amer 16 (*)    GFR calc Af Amer 19 (*)    All other components within normal limits  BRAIN NATRIURETIC PEPTIDE  TROPONIN I   ____________________________________________  EKG  ED ECG REPORT   Date: 09/07/2014  EKG Time: 0339  Rate: 74  Rhythm: normal EKG, normal sinus rhythm  Axis: LAD  Intervals:first-degree A-V block   ST&T Change: Unspecific  ____________________________________________  RADIOLOGY  Portable chest x-ray (reviewed by me, interpreted by Dr. Gerilyn Nestle):  No active disease. ____________________________________________   PROCEDURES  Procedure(s) performed: None  Critical Care performed: No  ____________________________________________   INITIAL IMPRESSION / ASSESSMENT AND PLAN / ED COURSE  Pertinent labs & imaging results that were available during my care of the patient were reviewed by me and considered in my medical decision making (see chart for details).  79 year old female with complaints of mild shortness of breath without chest pain. Negative chest x-ray, negative troponin. Creatinine is improved from prior admission. Suspect mild pulmonary vascular congestion without florid overload. Given  patient's recent admission for acute on chronic renal insufficiency, will hold off on diuresis given her unremarkable chest x-ray and lack of hypoxemia. Will ambulate patient on pulse oximeter.  ----------------------------------------- 7:04 AM on 09/07/2014 -----------------------------------------  Room air sats remained 100% while ambulating. Patient ambulated with steady gait without signs of dyspnea. Patient either for discharge so she can attend her sister's funeral. Will follow up with nephrologist for evaluation of mild hypercalcemia which has been trending up. Discussed with patient and given strict return precautions. Patient verbalizes understanding and agrees to plan of care. ____________________________________________   FINAL CLINICAL IMPRESSION(S) / ED DIAGNOSES  Final diagnoses:  Dyspnea      Paulette Blanch, MD 09/07/14 [phone]

## 2014-09-07 NOTE — Discharge Instructions (Signed)
1. Continue home medicines as directed by your doctor. 2. Return to the ER for worsening symptoms, persistent vomiting, chest pain, difficulty breathing or other concerns.  Shortness of Breath Shortness of breath means you have trouble breathing. It could also mean that you have a medical problem. You should get immediate medical care for shortness of breath. CAUSES   Not enough oxygen in the air such as with high altitudes or a smoke-filled room.  Certain lung diseases, infections, or problems.  Heart disease or conditions, such as angina or heart failure.  Low red blood cells (anemia).  Poor physical fitness, which can cause shortness of breath when you exercise.  Chest or back injuries or stiffness.  Being overweight.  Smoking.  Anxiety, which can make you feel like you are not getting enough air. DIAGNOSIS  Serious medical problems can often be found during your physical exam. Tests may also be done to determine why you are having shortness of breath. Tests may include:  Chest X-rays.  Lung function tests.  Blood tests.  An electrocardiogram (ECG).  An ambulatory electrocardiogram. An ambulatory ECG records your heartbeat patterns over a 24-hour period.  Exercise testing.  A transthoracic echocardiogram (TTE). During echocardiography, sound waves are used to evaluate how blood flows through your heart.  A transesophageal echocardiogram (TEE).  Imaging scans. Your health care provider may not be able to find a cause for your shortness of breath after your exam. In this case, it is important to have a follow-up exam with your health care provider as directed.  TREATMENT  Treatment for shortness of breath depends on the cause of your symptoms and can vary greatly. HOME CARE INSTRUCTIONS   Do not smoke. Smoking is a common cause of shortness of breath. If you smoke, ask for help to quit.  Avoid being around chemicals or things that may bother your breathing, such as  paint fumes and dust.  Rest as needed. Slowly resume your usual activities.  If medicines were prescribed, take them as directed for the full length of time directed. This includes oxygen and any inhaled medicines.  Keep all follow-up appointments as directed by your health care provider. SEEK MEDICAL CARE IF:   Your condition does not improve in the time expected.  You have a hard time doing your normal activities even with rest.  You have any new symptoms. SEEK IMMEDIATE MEDICAL CARE IF:   Your shortness of breath gets worse.  You feel light-headed, faint, or develop a cough not controlled with medicines.  You start coughing up blood.  You have pain with breathing.  You have chest pain or pain in your arms, shoulders, or abdomen.  You have a fever.  You are unable to walk up stairs or exercise the way you normally do. MAKE SURE YOU:  Understand these instructions.  Will watch your condition.  Will get help right away if you are not doing well or get worse. Document Released: 01/05/2001 Document Revised: 04/17/2013 Document Reviewed: 06/28/2011 [facility] Patient Information 2015 [place]. This information is not intended to replace advice given to you by your health care provider. Make sure you discuss any questions you have with your health care provider.

## 2014-09-07 NOTE — ED Notes (Signed)
Patient states that she has been short of breath since Friday morning. Patient states that she was discharged yesterday for kidney failure. Patient with swelling to bilateral legs.

## 2014-09-10 ENCOUNTER — Other Ambulatory Visit: Payer: Self-pay | Admitting: Internal Medicine

## 2014-09-11 ENCOUNTER — Ambulatory Visit (INDEPENDENT_AMBULATORY_CARE_PROVIDER_SITE_OTHER): Payer: Medicare Other | Admitting: Internal Medicine

## 2014-09-11 ENCOUNTER — Encounter: Payer: Self-pay | Admitting: Internal Medicine

## 2014-09-11 ENCOUNTER — Telehealth: Payer: Self-pay | Admitting: *Deleted

## 2014-09-11 VITALS — BP 108/50 | HR 75 | Temp 97.4°F | Resp 14 | Ht 64.0 in | Wt 188.2 lb

## 2014-09-11 DIAGNOSIS — IMO0002 Reserved for concepts with insufficient information to code with codable children: Secondary | ICD-10-CM

## 2014-09-11 DIAGNOSIS — I872 Venous insufficiency (chronic) (peripheral): Secondary | ICD-10-CM | POA: Diagnosis not present

## 2014-09-11 DIAGNOSIS — N179 Acute kidney failure, unspecified: Secondary | ICD-10-CM

## 2014-09-11 DIAGNOSIS — E1129 Type 2 diabetes mellitus with other diabetic kidney complication: Secondary | ICD-10-CM

## 2014-09-11 DIAGNOSIS — I10 Essential (primary) hypertension: Secondary | ICD-10-CM

## 2014-09-11 DIAGNOSIS — I952 Hypotension due to drugs: Secondary | ICD-10-CM | POA: Diagnosis not present

## 2014-09-11 DIAGNOSIS — E1165 Type 2 diabetes mellitus with hyperglycemia: Secondary | ICD-10-CM

## 2014-09-11 LAB — COMPREHENSIVE METABOLIC PANEL
ALBUMIN: 4.2 g/dL (ref 3.5–5.2)
ALK PHOS: 99 U/L (ref 39–117)
ALT: 22 U/L (ref 0–35)
AST: 22 U/L (ref 0–37)
BILIRUBIN TOTAL: 0.3 mg/dL (ref 0.2–1.2)
BUN: 47 mg/dL — AB (ref 6–23)
CALCIUM: 9.4 mg/dL (ref 8.4–10.5)
CO2: 25 mEq/L (ref 19–32)
Chloride: 98 mEq/L (ref 96–112)
Creatinine, Ser: 2.66 mg/dL — ABNORMAL HIGH (ref 0.40–1.20)
GFR: 22.39 mL/min — AB (ref >60.00)
Glucose, Bld: 107 mg/dL — ABNORMAL HIGH (ref 70–99)
Potassium: 3.8 mEq/L (ref 3.5–5.1)
Sodium: 131 mEq/L — ABNORMAL LOW (ref 135–145)
Total Protein: 7.5 g/dL (ref 6.0–8.3)

## 2014-09-11 NOTE — Progress Notes (Signed)
Patient ID: [name], female   DOB: 06-20-1935, 79 y.o.   MRN: [id number]

## 2014-09-11 NOTE — Progress Notes (Signed)
Pre-visit discussion using our clinic review tool. No additional management support is needed unless otherwise documented below in the visit note.  

## 2014-09-11 NOTE — Patient Instructions (Addendum)
STOP THE LASIX , the glipzide And the valsartan !  USE COMPRESSION STOCKINGS AND ELEVATION OF LEGS FOR FLUID RETENTION  I want you to check your sugars two times daily:  1) before dinner 2)  2 hrs after dinner ,  And   3)  fasting   Take 10 units of the insulin in the morning if your fasting is > 160.  5 units if < 160 but > 120  Take 15 units of insulin before dinner for CBG > 160,  8 units if < 160 but over 120    Your last fasting glucose indicates you are at risk for developing diabetes, so I am checking an A1c today   I want you to lose 25 lbs over the next six months with a low glycemic index diet and regular exercise (30 minutes of cardio 5 days per week is your goal)  This is  my version of a  "Low GI"  Diet:  It will still lower your blood sugars and allow you to lose 4 to 8  lbs  per month if you follow it carefully.  Your goal with exercise is a minimum of 30 minutes of aerobic exercise 5 days per week (Walking does not count once it becomes easy!)     All of the foods can be found at grocery stores and in bulk at [employer].  The Atkins protein bars and shakes are available in more varieties at Target, WalMart and [employer]'s Foods.     7 AM Breakfast:  Choose from the following:  Low carbohydrate Protein  Shakes (I recommend the EAS AdvantEdge "Carb Control" shakes  Or the low carb shakes by Atkins.    2.5 carbs   Arnold's "Sandwhich Thin"toasted  w/ peanut butter (no jelly: about 20 net carbs  "Bagel Thin" with cream cheese and salmon: about 20 carbs   a scrambled egg/bacon/cheese burrito made with Mission's "carb balance" whole wheat tortilla  (about 10 net carbs )  A slice of home made fritatta (egg based dish without a crust:  google it)    Avoid bananas, k to have oatmeal and cream of wheat  But no grits. They are loaded with carbohydrates!   Dannon Light n Fit AustriaGreek Yogurt  (80 cal, 8 carbs)  Other so called "protein bars" and Greek yogurts tend to be loaded with  carbohydrates.  Remember, in food advertising, the word "energy" is synonymous for " carbohydrate."  Lunch:   A Sandwich using the bread choices listed, Can use any  Eggs,  lunchmeat, grilled meat or canned tuna), avocado, regular mayo/mustard  and cheese.  A Salad using blue cheese, ranch,  Goddess or vinagrette,  No croutons or "confetti" and no "candied nuts" but regular nuts OK.   No pretzels or chips.  Pickles and miniature sweet peppers are a good low carb alternative that provide a "crunch"  The bread is the only source of carbohydrate in a sandwich and  can be decreased by trying some of these alternatives to traditional loaf bread  Joseph's makes a pita bread and a flat bread that are 50 cal and 4 net carbs available at BJs and WalMart.  This can be toasted to use with hummous as well  Toufayan makes a low carb flatbread that's 100 cal and 9 net carbs available at [employer] and [employer]  Mission makes 2 sizes of  Low carb whole wheat tortilla  (The large one is 210 cal and 6 net  carbs)  Flat Out makes flatbreads that are low carb as well  Avoid "Low fat dressings, as well as Reyne DumasCatalina and [address] [place] dressings They are loaded with sugar!     6 PM  Dinner:     Meat/fowl/fish with a green salad, and either broccoli, cauliflower, green beans, spinach, brussel sprouts or  Lima beans. DO NOT BREAD THE PROTEIN!!      There is a low carb pasta by Dreamfield's that is acceptable and tastes great: only 5 digestible carbs/serving.( All grocery stores but BJs carry it )  Try Kai LevinsMichel Angelo's chicken piccata or chicken or eggplant parm over low carb pasta.(Lowes and BJs)   Clifton CustardAaron Sanchez's "Carnitas" (pulled pork, no sauce,  0 carbs) or his beef pot roast to make a dinner burrito (at BJ's)  Pesto over low carb pasta (bj's sells a good quality pesto in the center refrigerated section of the deli   Try satueeing  Roosvelt HarpsBok Choy with mushroooms  Snacks"   Breyer's "low carb" fudgsicle or  ice cream bar  (Carb Smart line), or  Weight Watcher's ice cream bar , or another "no sugar added" ice cream;  a serving of fresh berries/cherries with whipped cream   Cheese or DANNON'S LlGHT N FIT GREEK YOGURT or the [place]os greek yogurt   8 ounces of [place] unsweetened almond/cococunut milk  Cheese and crackers (using WASA crackers,  They are low carb) or peanut butter on low carb crackers or pita bread     Avoid bananas, pineapple, grapes  and watermelon on a regular basis because they are high in sugar.  THINK OF THEM AS DESSERT  Remember that snack Substitutions should be less than 10 NET carbs per serving and meals should be < 25 net carbs. Remember that carbohydrates from fiber do not affect blood sugar, so you can  subtract fiber grams to get the "net carbs " of any particular food item.

## 2014-09-12 ENCOUNTER — Other Ambulatory Visit: Payer: Self-pay | Admitting: Internal Medicine

## 2014-09-12 ENCOUNTER — Telehealth: Payer: Self-pay | Admitting: *Deleted

## 2014-09-12 DIAGNOSIS — N179 Acute kidney failure, unspecified: Secondary | ICD-10-CM

## 2014-09-12 NOTE — Telephone Encounter (Signed)
[facility] called requesting a UA be collected on pt due to urinary leakage and frequency with foul odor and discharge.  Pt denies pain or burning.  [facility] also requests an order for a Child psychotherapistocial Worker to assist pt with community resources.  Also states a HA1C was collected on 5.18.16 and is requesting results.  Please advise

## 2014-09-12 NOTE — Telephone Encounter (Signed)
TSH referral in process  A1c was NOT done . It has not been 90 days since last one.

## 2014-09-14 MED ORDER — VALSARTAN 320 MG PO TABS: 320.0000 mg | ORAL_TABLET | Freq: Every day | ORAL | Status: DC

## 2014-09-14 NOTE — Assessment & Plan Note (Signed)
Explained to her and grandson again that her VI is not a sign of heart failure and will not respond to escalating doses of furosemide, but must be managed with compression stockings and leg elevation.

## 2014-09-14 NOTE — Assessment & Plan Note (Signed)
On chronic renal failure, Secondary to over diuresis, suspected.  Cr wast 5.2 at admission and returned to baseline with suspension of daily lasix and valsartan and has fortunately  remained at baseline for the past week despite resuming both medications in error at discharge .  Follow up up with nephrology

## 2014-09-14 NOTE — Assessment & Plan Note (Signed)
Will resume valsartan given stable Cr since discharge one week ago .

## 2014-09-14 NOTE — Progress Notes (Addendum)
Subjective:  Patient ID: [name], female    DOB: 30-Apr-1935  Age: 79 y.o. MRN: [id number]  CC: The primary encounter diagnosis was Essential hypertension. Diagnoses of Acute renal failure, unspecified acute renal failure type, Hypotension due to drugs, Venous insufficiency of both lower extremities, and Type 2 diabetes, uncontrolled, with renal manifestation were also pertinent to this visit.  HPI [name] presents for Hospital follow up. Patient was admitted to [facility] on May  . 10 with hypotension and acute on chronic renal failure secondary to overuse of diuretics. Cr was 5.2 on admission,  Nephrology was consulted and she was not dialyzed.  Cr was 2.6 at discharge on May 12.   Valsartan was given on day of discharge,  But DC summary reports that valsartan, glipizide and  lasix were stopped, She is accompanied by her 85 yr old grandson [name] who has been helping her out since discharge.     review of medications today indicates that she has been taking all 3 discontinued medications since discharge.  She states that she was confused despite recevinign instructions, because her sister died last week on the day she was discharged. From hospital .     She has been suspending her insulin.  Her Blood sugars have been labile   140 to 150 in the evenings. fastings have been 70 to 103 .  Takes the insulin 20 unit dose in the evening only if her if BS is above 250   Has not been taking insulin in the morning unless BS is over 250 .    Her malaise and fatigue has improved.  She denies dyspnea ,although she was reevaluated in the ER on may 14 for dyspnea with chest x ray .  She denies nausea and pain.  She is still grief stricken by the unexpected loss of her sister and is having difficulty concentrating, but still managing her own medications.  She is willing to delegate this responsibility to  [facility] RN as she lives alone.    Outpatient Prescriptions Prior to Visit  Medication Sig  Dispense Refill  . ACCU-CHEK SOFTCLIX LANCETS lancets CHECK BLOOD SUGAR TWICE A DAY 100 each 6  . ALPRAZolam (XANAX) 0.5 MG tablet TAKE ONE TABLET TWICE DAILY AS NEEDED FOR ANXIETY 60 tablet 2  . aspirin EC 81 MG tablet Take 81 mg by mouth daily.    . cloNIDine (CATAPRES) 0.1 MG tablet Take 0.1 mg by mouth as needed.     . CRESTOR 20 MG tablet TAKE ONE (1) TABLET EACH DAY 30 tablet 5  . glucose blood (ONE TOUCH ULTRA TEST) test strip Check sugar 2-3 times daily Dx 250.42 100 each 5  . Insulin Isophane & Regular Human (HUMULIN 70/30 KWIKPEN) (70-30) 100 UNIT/ML PEN Take 10 units Twice a day subcutaneously 15 mL 11  . lactulose (CHRONULAC) 10 GM/15ML solution Take 15 mLs (10 g total) by mouth once a week. As needed for constipation 240 mL 1  . NEXIUM 40 MG capsule TAKE ONE (1) CAPSULE EACH DAY BEFORE BREAKFAST 30 capsule 11  . spironolactone (ALDACTONE) 50 MG tablet TAKE ONE (1) TABLET EACH DAY 30 tablet 5  . ULORIC 40 MG tablet TAKE ONE (1) TABLET EACH DAY 30 tablet 5  . Blood Glucose Monitoring Suppl (ONE TOUCH ULTRA SYSTEM KIT) W/DEVICE KIT 1 kit by Does not apply route once. 1 each 0  . atenolol (TENORMIN) 25 MG tablet Take 1 tab PO every day (Patient not taking: Reported on 09/11/2014)  30 tablet 0   No facility-administered medications prior to visit.    Review of Systems;  Patient denies headache, fevers, malaise, unintentional weight loss, skin rash, eye pain, sinus congestion and sinus pain, sore throat, dysphagia,  hemoptysis , cough, dyspnea, wheezing, chest pain, palpitations, orthopnea, edema, abdominal pain, nausea, melena, diarrhea, constipation, flank pain, dysuria, hematuria, urinary  Frequency, nocturia, numbness, tingling, seizures,  Focal weakness, Loss of consciousness,  Tremor, insomnia, depression, , and suicidal ideation.      Objective:  BP 108/50 mmHg  Pulse 75  Temp(Src) 97.4 F (36.3 C) (Oral)  Resp 14  Ht _0  (1.626 m)  Wt 188 lb 4 oz (85.39 kg)  BMI 32.30  kg/m2  SpO2 98%  BP Readings from Last 3 Encounters:  09/25/14 122/60  09/11/14 108/50  09/07/14 141/99    Wt Readings from Last 3 Encounters:  09/25/14 190 lb (86.183 kg)  09/11/14 188 lb 4 oz (85.39 kg)  09/07/14 192 lb (87.091 kg)    General appearance: alert, cooperative and appears stated age Ears: normal TM's and external ear canals both ears Throat: lips, mucosa, and tongue normal; teeth and gums normal Neck: no adenopathy, no carotid bruit, supple, symmetrical, trachea midline and thyroid not enlarged, symmetric, no tenderness/mass/nodules Back: symmetric, no curvature. ROM normal. No CVA tenderness. Lungs: clear to auscultation bilaterally Heart: regular rate and rhythm, S1, S2 normal, no murmur, click, rub or gallop Abdomen: soft, non-tender; bowel sounds normal; no masses,  no organomegaly Pulses: 2+ and symmetric Skin: Skin color, texture, turgor normal. No rashes or lesions Lymph nodes: Cervical, supraclavicular, and axillary nodes normal.  Lab Results  Component Value Date   HGBA1C 9.4* 07/17/2014   HGBA1C 10.7* 01/16/2014   HGBA1C 8.5* 08/16/2013    Lab Results  Component Value Date   CREATININE 2.66* 09/11/2014   CREATININE 2.64* 09/07/2014   CREATININE 2.65* 09/05/2014    Lab Results  Component Value Date   WBC 4.4 09/07/2014   HGB 11.1* 09/07/2014   HCT 31.6* 09/07/2014   PLT 243 09/07/2014   GLUCOSE 107* 09/11/2014   CHOL 114 07/17/2014   TRIG 101.0 07/17/2014   HDL 33.80* 07/17/2014   LDLCALC 60 07/17/2014   ALT 22 09/11/2014   AST 22 09/11/2014   NA 131* 09/11/2014   K 3.8 09/11/2014   CL 98 09/11/2014   CREATININE 2.66* 09/11/2014   BUN 47* 09/11/2014   CO2 25 09/11/2014   TSH 4.60 10/10/2012   INR 1.0 01/27/2013   HGBA1C 9.4* 07/17/2014   MICROALBUR 11.2* 07/17/2014    Dg Chest Port 1 View  09/07/2014   CLINICAL DATA:  Shortness of breath.  EXAM: PORTABLE CHEST - 1 VIEW  COMPARISON:  09/03/2014  FINDINGS: The heart size and  mediastinal contours are within normal limits. Both lungs are clear. The visualized skeletal structures are unremarkable.  IMPRESSION: No active disease.   Electronically Signed   By: Lucienne Capers M.D.   On: 09/07/2014 04:15    Assessment & Plan:   Problem List Items Addressed This Visit    Hypertension - Primary    Will resume valsartan given stable Cr since discharge one week ago .       Relevant Medications   atenolol (TENORMIN) 100 MG tablet   valsartan (DIOVAN) 320 MG tablet   Type 2 diabetes, uncontrolled, with renal manifestation    Secondary to noncompliance with regimen.  Currently taking glipizide and prn insulin despite dc summary to dc glipizide .  Patient advised to resume insulin at 10 units in the am and 15 unis in the pm and stop the glipizide. Return in 2 weeks with log of blood sugars,  To be checked twice daily both fasting and post prandially.  She is not due to hga1c until June 24  Lab Results  Component Value Date   HGBA1C 9.4* 07/17/2014         Relevant Medications   valsartan (DIOVAN) 320 MG tablet   Venous insufficiency of leg    Explained to her and grandson again that her VI is not a sign of heart failure and will not respond to escalating doses of furosemide, but must be managed with compression stockings and leg elevation.       Relevant Medications   atenolol (TENORMIN) 100 MG tablet   valsartan (DIOVAN) 320 MG tablet   Acute renal failure    On chronic renal failure, Secondary to over diuresis, suspected.  Cr wast 5.2 at admission and returned to baseline with suspension of daily lasix and valsartan and has fortunately  remained at baseline for the past week despite resuming both medications in error at discharge .  Follow up up with nephrology       Relevant Orders   Comprehensive metabolic panel (Completed)   Hypotension    Etiology appeared to be dehydration per DC summary.  She is normotensive now on valsartan ,  Atenolol, spironolactone  and furosemide,  But I has suspended her daily furosemide and recommended every other day use.        Relevant Medications   atenolol (TENORMIN) 100 MG tablet   valsartan (DIOVAN) 320 MG tablet      I have discontinued Ms. Smock's ONE TOUCH ULTRA SYSTEM KIT, furosemide, and glipiZIDE. I have also changed her valsartan. Additionally, I am having her maintain her NEXIUM, glucose blood, CRESTOR, ULORIC, spironolactone, ALPRAZolam, cloNIDine, lactulose, aspirin EC, Insulin Isophane & Regular Human, ACCU-CHEK SOFTCLIX LANCETS, and atenolol.  Meds ordered this encounter  Medications  . DISCONTD: furosemide (LASIX) 40 MG tablet    Sig: Take 1 tablet by mouth daily.  Marland Kitchen atenolol (TENORMIN) 100 MG tablet    Sig: Take 1 tablet by mouth daily.  Marland Kitchen DISCONTD: glipiZIDE (GLUCOTROL) 10 MG tablet    Sig: Take 1 tablet by mouth daily.  Marland Kitchen DISCONTD: valsartan (DIOVAN) 320 MG tablet    Sig:   . valsartan (DIOVAN) 320 MG tablet    Sig: Take 1 tablet (320 mg total) by mouth daily.    Dispense:  30 tablet    Refill:  5    Medications Discontinued During This Encounter  Medication Reason  . Blood Glucose Monitoring Suppl (ONE TOUCH ULTRA SYSTEM KIT) W/DEVICE KIT   . atenolol (TENORMIN) 25 MG tablet   . valsartan (DIOVAN) 320 MG tablet   . furosemide (LASIX) 40 MG tablet   . glipiZIDE (GLUCOTROL) 10 MG tablet    A total of 40 minutes was spent with patient more than half of which was spent in counseling patient on the above mentioned issues , reviewing and explaining recent labs and imaging studies done, and coordination of care.  Follow-up: Return in about 2 weeks (around 09/25/2014).   Crecencio Mc, MD

## 2014-09-14 NOTE — Assessment & Plan Note (Signed)
Etiology appeared to be dehydration per DC summary.  She is normotensive now on valsartan ,  Atenolol, spironolactone and furosemide,  But I has suspended her daily furosemide and recommended every other day use.

## 2014-09-14 NOTE — Assessment & Plan Note (Addendum)
Secondary to noncompliance with regimen.  Currently taking glipizide and prn insulin despite dc summary to dc glipizide .  Patient advised to resume insulin at 10 units in the am and 15 unis in the pm and stop the glipizide. Return in 2 weeks with log of blood sugars,  To be checked twice daily both fasting and post prandially.  She is not due to hga1c until June 24  Lab Results  Component Value Date   HGBA1C 9.4* 07/17/2014

## 2014-09-15 ENCOUNTER — Telehealth: Payer: Self-pay | Admitting: Internal Medicine

## 2014-09-15 NOTE — Telephone Encounter (Signed)
The blood sugar logs were received,  But there were only 2 readings from May , so I cannot make and assessments or changes yet based on these.  The April readings are irrelevant since she has been in the hospital since then and her medicaitons changed. Can they provide some from last week after I saw her ?

## 2014-09-16 NOTE — Telephone Encounter (Signed)
Line Busy no voicemail.

## 2014-09-16 NOTE — Telephone Encounter (Signed)
Called and advised patient that MD needs log of 7 days of blood sugars called patient DPR with directions as to blood sugars and advised will attain reading and bring to office.

## 2014-09-19 ENCOUNTER — Telehealth: Payer: Self-pay | Admitting: Internal Medicine

## 2014-09-19 NOTE — Telephone Encounter (Signed)
There is no evidence of UTI by culture results.  If she is still having symptoms she should make appt for pelvic exam  

## 2014-09-20 NOTE — Telephone Encounter (Signed)
Patient notified and voiced understanding. Patient stated" I am having no symptoms at this time".

## 2014-09-24 ENCOUNTER — Telehealth: Payer: Self-pay | Admitting: *Deleted

## 2014-09-24 NOTE — Telephone Encounter (Signed)
Left detailed message on Tracy's VM of MDs message.

## 2014-09-24 NOTE — Telephone Encounter (Signed)
French Anaracy from [facility] called states pts BP manually 90/40 right arm sitting, electronically 111/56 left arm sitting, standing 121/58.  Waited 15 minutes rechecked manually in right arm 112/50 sitting, 122/50 standing.  Please advise

## 2014-09-24 NOTE — Telephone Encounter (Signed)
Nothing to do,  Those are normal and do not indicate orthostasis.

## 2014-09-25 ENCOUNTER — Encounter: Payer: Self-pay | Admitting: Internal Medicine

## 2014-09-25 ENCOUNTER — Ambulatory Visit (INDEPENDENT_AMBULATORY_CARE_PROVIDER_SITE_OTHER): Payer: Medicare Other | Admitting: Internal Medicine

## 2014-09-25 VITALS — BP 122/60 | HR 68 | Temp 97.7°F | Resp 16 | Ht 64.0 in | Wt 190.0 lb

## 2014-09-25 DIAGNOSIS — E1129 Type 2 diabetes mellitus with other diabetic kidney complication: Secondary | ICD-10-CM | POA: Diagnosis not present

## 2014-09-25 DIAGNOSIS — R4189 Other symptoms and signs involving cognitive functions and awareness: Secondary | ICD-10-CM | POA: Diagnosis not present

## 2014-09-25 DIAGNOSIS — E1165 Type 2 diabetes mellitus with hyperglycemia: Secondary | ICD-10-CM

## 2014-09-25 DIAGNOSIS — R300 Dysuria: Secondary | ICD-10-CM

## 2014-09-25 DIAGNOSIS — I872 Venous insufficiency (chronic) (peripheral): Secondary | ICD-10-CM

## 2014-09-25 DIAGNOSIS — IMO0002 Reserved for concepts with insufficient information to code with codable children: Secondary | ICD-10-CM

## 2014-09-25 DIAGNOSIS — D631 Anemia in chronic kidney disease: Secondary | ICD-10-CM

## 2014-09-25 DIAGNOSIS — N179 Acute kidney failure, unspecified: Secondary | ICD-10-CM

## 2014-09-25 DIAGNOSIS — E669 Obesity, unspecified: Secondary | ICD-10-CM

## 2014-09-25 DIAGNOSIS — N184 Chronic kidney disease, stage 4 (severe): Secondary | ICD-10-CM

## 2014-09-25 NOTE — Progress Notes (Signed)
Pre-visit discussion using our clinic review tool. No additional management support is needed unless otherwise documented below in the visit note.  

## 2014-09-25 NOTE — Patient Instructions (Addendum)
Your blood sugars are improving using the 10 units of insulin before breakfast and 15 units before dinner  Eat a snack between breakfast and lynch; Peanut butter  Or  KIND low GI bar   Your post supper sugars are still high.  If you walk after supper for 15 minutes, or ride the bike will help without having to take a higher dose of insulin   Continue 10 units in the AM and the 15 units in the evening (unless BS < 100 or she is not hungry and not eating)  Use any of these  protein shakes  To avoid fasting or as a snack :  Premier protein EAS Carb Control Atkins    Labs A1c on or after Jun 24th

## 2014-09-25 NOTE — Progress Notes (Signed)
Subjective:  Patient ID: [name], female    DOB: 1935-06-15  Age: 79 y.o. MRN: [id number]  CC: There were no encounter diagnoses.kidmey d  HPI [name] presents for follow up on DM type 2, acute on chronic kidney disease, anemia of CKD and hypertension.  She was seen two weeks ago after hospital discharge and at that time she had not been following DC orders regarding medication changes.  In spite of her non adherence, her kidney function had improved to baseline.  Valsartan was resumed for hypertension , glipizide was stopped and 70//30 insulin bid was resumed at 10 units qam and 15 units pm .  Nephew/grandson has been cooking for her .  He brought a log of her BS, which was reviewed with patient today    Outpatient Prescriptions Prior to Visit  Medication Sig Dispense Refill  . ACCU-CHEK SOFTCLIX LANCETS lancets CHECK BLOOD SUGAR TWICE A DAY 100 each 6  . ALPRAZolam (XANAX) 0.5 MG tablet TAKE ONE TABLET TWICE DAILY AS NEEDED FOR ANXIETY 60 tablet 2  . aspirin EC 81 MG tablet Take 81 mg by mouth daily.    Marland Kitchen. atenolol (TENORMIN) 100 MG tablet Take 1 tablet by mouth daily.    . cloNIDine (CATAPRES) 0.1 MG tablet Take 0.1 mg by mouth as needed.     . CRESTOR 20 MG tablet TAKE ONE (1) TABLET EACH DAY 30 tablet 5  . glucose blood (ONE TOUCH ULTRA TEST) test strip Check sugar 2-3 times daily Dx 250.42 100 each 5  . Insulin Isophane & Regular Human (HUMULIN 70/30 KWIKPEN) (70-30) 100 UNIT/ML PEN Take 10 units Twice a day subcutaneously 15 mL 11  . lactulose (CHRONULAC) 10 GM/15ML solution Take 15 mLs (10 g total) by mouth once a week. As needed for constipation 240 mL 1  . NEXIUM 40 MG capsule TAKE ONE (1) CAPSULE EACH DAY BEFORE BREAKFAST 30 capsule 11  . spironolactone (ALDACTONE) 50 MG tablet TAKE ONE (1) TABLET EACH DAY 30 tablet 5  . ULORIC 40 MG tablet TAKE ONE (1) TABLET EACH DAY 30 tablet 5  . valsartan (DIOVAN) 320 MG tablet Take 1 tablet (320 mg total) by mouth daily. 30 tablet  5   No facility-administered medications prior to visit.    Review of Systems;  Patient denies headache, fevers, malaise, unintentional weight loss, skin rash, eye pain, sinus congestion and sinus pain, sore throat, dysphagia,  hemoptysis , cough, dyspnea, wheezing, chest pain, palpitations, orthopnea, edema, abdominal pain, nausea, melena, diarrhea, constipation, flank pain, dysuria, hematuria, urinary  Frequency, nocturia, numbness, tingling, seizures,  Focal weakness, Loss of consciousness,  Tremor, insomnia, depression, anxiety, and suicidal ideation.      Objective:  BP 122/60 mmHg  Pulse 68  Temp(Src) 97.7 F (36.5 C) (Oral)  Resp 16  Ht 5\' 4"  (1.626 m)  Wt 190 lb (86.183 kg)  BMI 32.60 kg/m2  SpO2 98%  BP Readings from Last 3 Encounters:  09/25/14 122/60  09/11/14 108/50  09/07/14 141/99    Wt Readings from Last 3 Encounters:  09/25/14 190 lb (86.183 kg)  09/11/14 188 lb 4 oz (85.39 kg)  09/07/14 192 lb (87.091 kg)    General appearance: alert, cooperative and appears stated age Throat: lips, mucosa, and tongue normal; teeth and gums normal Neck: no adenopathy, no carotid bruit, supple, symmetrical, trachea midline and thyroid not enlarged, symmetric, no tenderness/mass/nodules Back: symmetric, no curvature. ROM normal. No CVA tenderness. Lungs: clear to auscultation bilaterally Heart: regular rate and  rhythm, S1, S2 normal, no murmur, click, rub or gallop Abdomen: soft, non-tender; bowel sounds normal; no masses,  no organomegaly Pulses: 2+ and symmetric Skin: Skin color notable for venous stasis changes  . No rashes or lesions Lymph nodes: Cervical, supraclavicular, and axillary nodes normal.  Lab Results  Component Value Date   HGBA1C 9.4* 07/17/2014   HGBA1C 10.7* 01/16/2014   HGBA1C 8.5* 08/16/2013    Lab Results  Component Value Date   CREATININE 2.66* 09/11/2014   CREATININE 2.64* 09/07/2014   CREATININE 2.65* 09/05/2014    Lab Results    Component Value Date   WBC 4.4 09/07/2014   HGB 11.1* 09/07/2014   HCT 31.6* 09/07/2014   PLT 243 09/07/2014   GLUCOSE 107* 09/11/2014   CHOL 114 07/17/2014   TRIG 101.0 07/17/2014   HDL 33.80* 07/17/2014   LDLCALC 60 07/17/2014   ALT 22 09/11/2014   AST 22 09/11/2014   NA 131* 09/11/2014   K 3.8 09/11/2014   CL 98 09/11/2014   CREATININE 2.66* 09/11/2014   BUN 47* 09/11/2014   CO2 25 09/11/2014   TSH 4.60 10/10/2012   INR 1.0 01/27/2013   HGBA1C 9.4* 07/17/2014   MICROALBUR 11.2* 07/17/2014    Dg Chest Port 1 View  09/07/2014   CLINICAL DATA:  Shortness of breath.  EXAM: PORTABLE CHEST - 1 VIEW  COMPARISON:  09/03/2014  FINDINGS: The heart size and mediastinal contours are within normal limits. Both lungs are clear. The visualized skeletal structures are unremarkable.  IMPRESSION: No active disease.   Electronically Signed   By: Burman Nieves M.D.   On: 09/07/2014 04:15    Assessment & Plan:   Problem List Items Addressed This Visit    None      I am having Ms. Browning maintain her NEXIUM, glucose blood, CRESTOR, ULORIC, spironolactone, ALPRAZolam, cloNIDine, lactulose, aspirin EC, Insulin Isophane & Regular Human, ACCU-CHEK SOFTCLIX LANCETS, atenolol, and valsartan.  No orders of the defined types were placed in this encounter.    There are no discontinued medications.  Follow-up: No Follow-up on file.   Sherlene Shams, MD

## 2014-09-27 ENCOUNTER — Encounter: Payer: Self-pay | Admitting: Internal Medicine

## 2014-09-27 NOTE — Assessment & Plan Note (Signed)
She remains intellectually intact with regard to MMSE but is a poor historian and a rambling almost tangiental conversation pattern.  She is often noncompliant with medications due to psychologic conditions including anxiety and personality disorder.  Her family member has been assisting with medications and meal planning and has been bringing her to office visits, which has helped significantly.

## 2014-09-27 NOTE — Assessment & Plan Note (Signed)
I have addressed  BMI and recommended wt loss of 10% of body weigh over the next 6 months using a low glycemic index diet and regular exercise a minimum of 5 days per week.   

## 2014-09-27 NOTE — Assessment & Plan Note (Addendum)
Improving control with supervision by family member and careful attention to diet,  Continue 70/30 insuing bid 10 untis and 15 units,  And urged to walk after dinner , use low carb protein shakes for snacks and missed meals. \ She is not due to have a repeat hga1c until June 24  Lab Results  Component Value Date   HGBA1C 9.4* 07/17/2014

## 2014-09-27 NOTE — Assessment & Plan Note (Signed)
Explained to her and grandson again that her VI is not a sign of heart failure and will not respond to escalating doses of furosemide, but must be managed with compression stockings and leg elevation.  

## 2014-09-27 NOTE — Assessment & Plan Note (Signed)
GFR is back to baseline. Resume valsartan and daily spironolactone but no daily lasix.

## 2014-09-27 NOTE — Assessment & Plan Note (Signed)
Repeat UA was normal.

## 2014-09-27 NOTE — Assessment & Plan Note (Signed)
Her hemoglobin has improved to 11.1 She will follow up with nephrology every 3 months for consideration of iron infusions. Lab Results  Component Value Date   HGB 11.1* 09/07/2014

## 2014-09-30 ENCOUNTER — Other Ambulatory Visit: Payer: Self-pay | Admitting: Internal Medicine

## 2014-10-24 ENCOUNTER — Other Ambulatory Visit (INDEPENDENT_AMBULATORY_CARE_PROVIDER_SITE_OTHER): Payer: Medicare Other

## 2014-10-24 DIAGNOSIS — E1165 Type 2 diabetes mellitus with hyperglycemia: Secondary | ICD-10-CM | POA: Diagnosis not present

## 2014-10-24 DIAGNOSIS — N179 Acute kidney failure, unspecified: Secondary | ICD-10-CM | POA: Diagnosis not present

## 2014-10-24 DIAGNOSIS — IMO0002 Reserved for concepts with insufficient information to code with codable children: Secondary | ICD-10-CM

## 2014-10-24 DIAGNOSIS — E1129 Type 2 diabetes mellitus with other diabetic kidney complication: Secondary | ICD-10-CM

## 2014-10-24 DIAGNOSIS — N184 Chronic kidney disease, stage 4 (severe): Secondary | ICD-10-CM

## 2014-10-24 LAB — COMPREHENSIVE METABOLIC PANEL
ALT: 26 U/L (ref 0–35)
AST: 25 U/L (ref 0–37)
Albumin: 3.9 g/dL (ref 3.5–5.2)
Alkaline Phosphatase: 115 U/L (ref 39–117)
BUN: 52 mg/dL — ABNORMAL HIGH (ref 6–23)
CALCIUM: 9.8 mg/dL (ref 8.4–10.5)
CHLORIDE: 101 meq/L (ref 96–112)
CO2: 28 meq/L (ref 19–32)
CREATININE: 2.85 mg/dL — AB (ref 0.40–1.20)
GFR: 20.67 mL/min — AB (ref >60.00)
GLUCOSE: 115 mg/dL — AB (ref 70–99)
Potassium: 4.6 mEq/L (ref 3.5–5.1)
Sodium: 133 mEq/L — ABNORMAL LOW (ref 135–145)
TOTAL PROTEIN: 7.3 g/dL (ref 6.0–8.3)
Total Bilirubin: 0.4 mg/dL (ref 0.2–1.2)

## 2014-10-24 LAB — LIPID PANEL
Cholesterol: 123 mg/dL (ref 0–200)
HDL: 38.4 mg/dL — AB (ref >39.00)
LDL CALC: 68 mg/dL (ref 0–99)
NonHDL: 84.6
Total CHOL/HDL Ratio: 3
Triglycerides: 85 mg/dL (ref 0.0–149.0)
VLDL: 17 mg/dL (ref 0.0–40.0)

## 2014-10-24 LAB — MICROALBUMIN / CREATININE URINE RATIO
CREATININE, U: 134.5 mg/dL
MICROALB UR: 6.2 mg/dL — AB (ref 0.0–1.9)
Microalb Creat Ratio: 4.6 mg/g (ref 0.0–30.0)

## 2014-10-24 LAB — HEMOGLOBIN A1C: Hgb A1c MFr Bld: 7 % — ABNORMAL HIGH (ref 4.6–6.5)

## 2014-10-25 ENCOUNTER — Telehealth: Payer: Self-pay | Admitting: *Deleted

## 2014-10-25 MED ORDER — FLUCONAZOLE 150 MG PO TABS: 150.0000 mg | ORAL_TABLET | Freq: Every day | ORAL | Status: DC

## 2014-10-25 NOTE — Telephone Encounter (Signed)
I will call in fluconazole 150 mg daily x 2 ,  Works better than [facility]at.   If no resolution,  Needs pelvic exam/ appt

## 2014-10-25 NOTE — Addendum Note (Signed)
Addended by: Sherlene ShamsULLO, Mckenleigh Tarlton L on: 10/25/2014 11:52 AM   Modules accepted: Orders

## 2014-10-25 NOTE — Telephone Encounter (Signed)
Spoke with pt, advised of Mds message, pt verbalized understanding.

## 2014-10-25 NOTE — Telephone Encounter (Signed)
Pt called states she thinks she may have a yeast infection.  Pt is requesting an Rx be sent in, pt was advised she would need to be evaluated prior to Rx being called in.  Pt further [facility], [facility] pt denied both.  Pt states she had called her pharmacy as well and they were going to send her Monistat.pt states she would try that.  Please advise

## 2014-10-30 ENCOUNTER — Other Ambulatory Visit: Payer: Self-pay | Admitting: Internal Medicine

## 2014-10-31 NOTE — Telephone Encounter (Signed)
Last visit 6/1.  Ok to fill for 2 months,  i believe I already responded to this from [place].

## 2014-10-31 NOTE — Telephone Encounter (Signed)
Last visit 10/25/14, refill?

## 2014-11-06 ENCOUNTER — Other Ambulatory Visit: Payer: Self-pay | Admitting: Internal Medicine

## 2014-11-06 NOTE — Telephone Encounter (Signed)
Last OV 09/25/14, Pt was advised to f/u with you on or around 10/23/14, she did not schedule an appt.  Okay to refill?

## 2014-11-07 NOTE — Telephone Encounter (Signed)
Ok to refill,  Refill sent  

## 2014-11-21 ENCOUNTER — Other Ambulatory Visit: Payer: Self-pay | Admitting: Internal Medicine

## 2014-12-04 ENCOUNTER — Other Ambulatory Visit: Payer: Self-pay | Admitting: Internal Medicine

## 2014-12-12 ENCOUNTER — Ambulatory Visit (INDEPENDENT_AMBULATORY_CARE_PROVIDER_SITE_OTHER): Payer: Medicare Other | Admitting: Family Medicine

## 2014-12-12 ENCOUNTER — Encounter: Payer: Self-pay | Admitting: Family Medicine

## 2014-12-12 ENCOUNTER — Other Ambulatory Visit (HOSPITAL_COMMUNITY)
Admission: RE | Admit: 2014-12-12 | Discharge: 2014-12-12 | Disposition: A | Discharge status: Home / Self Care | Payer: Medicare Other | Source: Ambulatory Visit | Attending: Family Medicine | Admitting: Family Medicine

## 2014-12-12 ENCOUNTER — Telehealth: Payer: Self-pay | Admitting: Internal Medicine

## 2014-12-12 VITALS — BP 124/72 | HR 66 | Temp 99.0°F | Ht 64.0 in | Wt 188.6 lb

## 2014-12-12 DIAGNOSIS — R5383 Other fatigue: Secondary | ICD-10-CM | POA: Diagnosis not present

## 2014-12-12 DIAGNOSIS — Z113 Encounter for screening for infections with a predominantly sexual mode of transmission: Secondary | ICD-10-CM | POA: Insufficient documentation

## 2014-12-12 DIAGNOSIS — N3000 Acute cystitis without hematuria: Secondary | ICD-10-CM | POA: Diagnosis not present

## 2014-12-12 DIAGNOSIS — N898 Other specified noninflammatory disorders of vagina: Secondary | ICD-10-CM | POA: Diagnosis not present

## 2014-12-12 DIAGNOSIS — N760 Acute vaginitis: Secondary | ICD-10-CM | POA: Insufficient documentation

## 2014-12-12 DIAGNOSIS — J069 Acute upper respiratory infection, unspecified: Secondary | ICD-10-CM

## 2014-12-12 DIAGNOSIS — N390 Urinary tract infection, site not specified: Secondary | ICD-10-CM | POA: Insufficient documentation

## 2014-12-12 DIAGNOSIS — R300 Dysuria: Secondary | ICD-10-CM

## 2014-12-12 LAB — POCT URINALYSIS DIPSTICK
Bilirubin, UA: NEGATIVE
Glucose, UA: NEGATIVE
KETONES UA: NEGATIVE
Nitrite, UA: NEGATIVE
PROTEIN UA: 100
SPEC GRAV UA: 1.02
Urobilinogen, UA: 0.2
pH, UA: 5

## 2014-12-12 LAB — CBC
HEMATOCRIT: 31.7 % — AB (ref 36.0–46.0)
HEMOGLOBIN: 10.8 g/dL — AB (ref 12.0–15.0)
MCHC: 34.1 g/dL (ref 30.0–36.0)
MCV: 94.2 fl (ref 78.0–100.0)
Platelets: 230 10*3/uL (ref 150.0–400.0)
RBC: 3.36 Mil/uL — ABNORMAL LOW (ref 3.87–5.11)
RDW: 12.8 % (ref 11.5–15.5)
WBC: 4.7 10*3/uL (ref 4.0–10.5)

## 2014-12-12 LAB — TSH: TSH: 7.07 u[IU]/mL — ABNORMAL HIGH (ref 0.35–4.50)

## 2014-12-12 MED ORDER — CEPHALEXIN 250 MG PO CAPS: 250.0000 mg | ORAL_CAPSULE | Freq: Two times a day (BID) | ORAL | Status: DC

## 2014-12-12 MED ORDER — CEPHALEXIN 500 MG PO CAPS: 500.0000 mg | ORAL_CAPSULE | Freq: Four times a day (QID) | ORAL | Status: DC

## 2014-12-12 MED ORDER — FLUCONAZOLE 150 MG PO TABS: 150.0000 mg | ORAL_TABLET | ORAL | Status: DC

## 2014-12-12 NOTE — Telephone Encounter (Signed)
Pt

## 2014-12-12 NOTE — Assessment & Plan Note (Addendum)
Patient with urinary urgency and UA evidence of UTI. Vitals stable. Will treat with keflex for UTI and send urine for micro and culture. Initially keflex 500 mg qid sent to pharmacy. After sending this I noted patients renal function and noted that renal dosing was needed given recent GFR of 20. I called the pharmacy and spoke with the pharmacist. She advised that the prescription had not been picked up yet and the prescription was changed to keflex 250 mg BID for 7 days. Spoke with [facility] pharmacist who confirmed renal dosing. Called and spoke to patient as well to inform of change to 250 mg BID dosing. She voiced understanding and stated that she was waiting on delivery of the medication. Patient additionally with small amount of creamy vaginal discharge and vaginal irritation on exam. Hysterectomy makes PID unlikely. No tenderness on bimanual exam. Benign abdominal exam. Will send GC/chlamydia/ trich/yeast/BV. Will treat with diflucan as patient has history of yeast infection with UTI treatment. Patient given return precautions.

## 2014-12-12 NOTE — Progress Notes (Signed)
Pre visit review using our clinic review tool, if applicable. No additional management support is needed unless otherwise documented below in the visit note. 

## 2014-12-12 NOTE — Progress Notes (Addendum)
Patient ID: [name], female   DOB: 30-Mar-1936, 79 y.o.   MRN: [id number]  [name] Alar, MD Phone: [phone]  [name] is a 79 y.o. female who presents today for same day visit.  Sinus congestion: patient notes 3-4 days of sinus congestion, clear rhinorrhea, and post nasal drip with mucus in her throat. She notes dry cough. No shortness of breath or chest pain. Notes the mucus in her throat causes some throat tightness only when laying down. No issues swallowing. No issues breathing. Notes she feels fatigued with this. Notes she felt feverish yesterday, though did not check temp. No bleeding noted. Has a history of anemia relating to CKD. No orthopnea. States swelling in LE is stable. Blood sugar today in the 180-190 range.   Vaginal discharge: patient reports several days of creamy vaginal discharge and urinary urgency. No abdominal pain, dysuria, frequency, nausea, vomiting, diarrhea, or vaginal bleeding. Notes she has not been sexually active in over 20 years. Has history of hysterectomy.   PMH: former smoker. History of anemia  ROS see HPI  Objective  Physical Exam Filed Vitals:   12/12/14 1228  BP: 124/72  Pulse:   Temp:     BP Readings from Last 3 Encounters:  12/12/14 124/72  09/25/14 122/60  09/11/14 108/50   Wt Readings from Last 3 Encounters:  12/12/14 188 lb 9.6 oz (85.548 kg)  09/25/14 190 lb (86.183 kg)  09/11/14 188 lb 4 oz (85.39 kg)  Ambulatory O2 sat 98%  Physical Exam  Constitutional: She is well-developed, well-nourished, and in no distress.  HENT:  Head: Normocephalic.  Right Ear: External ear normal.  Left Ear: External ear normal.  Mouth/Throat: No oropharyngeal exudate.  Normal TMs bilaterally, no tonsillar swelling or exudate, mild posterior OP erythema and post nasal drip  Eyes: Conjunctivae are normal. Pupils are equal, round, and reactive to light.  Neck: Neck supple.  Cardiovascular: Normal rate and regular rhythm.  Exam  reveals no gallop and no friction rub.   No murmur heard. Pulmonary/Chest: Effort normal and breath sounds normal. No respiratory distress. She has no wheezes. She has no rales. She exhibits no tenderness.  Abdominal: Soft. Bowel sounds are normal. She exhibits no distension. There is no tenderness. There is no rebound and no guarding.  Genitourinary: Right adnexa normal and left adnexa normal.  Normal labia, vaginal mucosa mildly erythematous with creamy discharge, patient with history of hysterectomy with cervix removal, no cervix visualized  Lymphadenopathy:    She has no cervical adenopathy.  Neurological: She is alert.  Skin: Skin is warm and dry. She is not diaphoretic.     Assessment/Plan: Please see individual problem list.  URI (upper respiratory infection) Patient with symptoms of URI and evidence of post nasal drip and mucus. No chest pain or shortness of breath with this. Mild tightness in throat with laying down likely related to mucus production and viral irritation. No issues swallowing. Vitals stable. Fatigue likely related to illness, though will check CBC and TSH to evaluate for other causes. Discussed likely viral nature of illness and use of nasal saline. Given return precautions.   Infection of urinary tract Patient with urinary urgency and UA evidence of UTI. Vitals stable. Will treat with keflex for UTI and send urine for micro and culture. Initially keflex 500 mg qid sent to pharmacy. After sending this I noted patients renal function and noted that renal dosing was needed given recent GFR of 20. I called the pharmacy and spoke with the  pharmacist. She advised that the prescription had not been picked up yet and the prescription was changed to keflex 250 mg BID for 7 days. Spoke with [facility] pharmacist who confirmed renal dosing. Called and spoke to patient as well to inform of change to 250 mg BID dosing. She voiced understanding and stated that she was waiting on delivery of  the medication. Patient additionally with small amount of creamy vaginal discharge and vaginal irritation on exam. Hysterectomy makes PID unlikely. No tenderness on bimanual exam. Benign abdominal exam. Will send GC/chlamydia/ trich/yeast/BV. Will treat with diflucan as patient has history of yeast infection with UTI treatment. Patient given return precautions.     Orders Placed This Encounter  Procedures  . CBC  . TSH  . POCT Urinalysis Dipstick    Meds ordered this encounter  Medications  . DISCONTD: cephALEXin (KEFLEX) 500 MG capsule    Sig: Take 1 capsule (500 mg total) by mouth 4 (four) times daily.    Dispense:  28 capsule    Refill:  0  . fluconazole (DIFLUCAN) 150 MG tablet    Sig: Take 1 tablet (150 mg total) by mouth every 3 (three) days.    Dispense:  2 tablet    Refill:  0  . cephALEXin (KEFLEX) 250 MG capsule    Sig: Take 1 capsule (250 mg total) by mouth 2 (two) times daily.    Dispense:  14 capsule    Refill:  0    [name]

## 2014-12-12 NOTE — Patient Instructions (Signed)
Nice to meet you. Your urine likely shows an infection. We will treat this with an antibiotic.  We will give you diflucan to help prevent yeast infection with the antibiotic. Your sinus congestion is likely related to a viral infection. Please use nasal saline to help with this.  If you develop fever, chest pain, shortness of breath, abdominal pain, nausea, vomiting, diarrhea, vaginal bleeding, or fever please seek medical attention.

## 2014-12-12 NOTE — Addendum Note (Signed)
Addended by: Montine Circle D on: 12/12/2014 03:17 PM   Modules accepted: Orders

## 2014-12-12 NOTE — Assessment & Plan Note (Addendum)
Patient with symptoms of URI and evidence of post nasal drip and mucus. No chest pain or shortness of breath with this. Mild tightness in throat with laying down likely related to mucus production and viral irritation. No issues swallowing. Vitals stable. Fatigue likely related to illness, though will check CBC and TSH to evaluate for other causes. Discussed likely viral nature of illness and use of nasal saline. Given return precautions.

## 2014-12-13 ENCOUNTER — Telehealth: Payer: Self-pay | Admitting: Family Medicine

## 2014-12-13 LAB — URINALYSIS, MICROSCOPIC ONLY
BACTERIA UA: NONE SEEN [HPF]
CRYSTALS: NONE SEEN [HPF]
Casts: NONE SEEN [LPF]
Yeast: NONE SEEN [HPF]

## 2014-12-13 MED ORDER — LEVOTHYROXINE SODIUM 25 MCG PO TABS: 12.5000 ug | ORAL_TABLET | Freq: Every day | ORAL | Status: DC

## 2014-12-13 NOTE — Telephone Encounter (Signed)
Attempted to call the patient to discuss lab work. VM was full. Will attempt to call patient at a later time.

## 2014-12-13 NOTE — Telephone Encounter (Signed)
Called and spoke with patient regarding her lab work. Patient notes she feels improved from the day of her visit. Advised that her TSH is elevated. Discussed that this meant she was hypothyroid. Discussed that this had been elevated in the past, though she chose to forgo treatment at that time and repeat testing was normal. Discussed that this could be contributing to some of her fatigue and I would like to start her on synthroid. Given her age will start on low dose 12.5 mcg daily and have patient follow-up in 6 weeks for dose adjustment with Dr Darrick Huntsman. Advised to monitor for CP, dyspnea, and palpitations with starting the medicaiton. Advised that we would call her when her urine culture returned.

## 2014-12-14 ENCOUNTER — Other Ambulatory Visit: Payer: Self-pay | Admitting: Internal Medicine

## 2014-12-14 LAB — URINE CULTURE
Colony Count: NO GROWTH
Organism ID, Bacteria: NO GROWTH

## 2014-12-16 ENCOUNTER — Other Ambulatory Visit: Payer: Self-pay | Admitting: Family Medicine

## 2014-12-16 ENCOUNTER — Telehealth: Payer: Self-pay | Admitting: Internal Medicine

## 2014-12-16 DIAGNOSIS — R8281 Pyuria: Secondary | ICD-10-CM

## 2014-12-16 LAB — CERVICOVAGINAL ANCILLARY ONLY
BACTERIAL VAGINITIS: POSITIVE — AB
Candida vaginitis: NEGATIVE
Chlamydia: NEGATIVE
Neisseria Gonorrhea: NEGATIVE
Trichomonas: NEGATIVE

## 2014-12-16 NOTE — Telephone Encounter (Signed)
Pt called about one of her medication that is making her hallucinate this morning. Pt states that she thought she got a call today from the doctor telling her to stop taking her medication. Pt is not sure which one the medications is doing that. Thank You!

## 2014-12-16 NOTE — Telephone Encounter (Signed)
Spoke with pt advised Dr Birdie Sons wanted her to discontinue her Keflex as her UA did not show an infection, was not sure pt completely understood me so I called and left a detailed message on her daughters VM.

## 2014-12-17 ENCOUNTER — Telehealth: Payer: Self-pay | Admitting: Family Medicine

## 2014-12-17 MED ORDER — METRONIDAZOLE 500 MG PO TABS: 500.0000 mg | ORAL_TABLET | Freq: Two times a day (BID) | ORAL | Status: DC

## 2014-12-17 NOTE — Telephone Encounter (Signed)
Called patient to advise of positive BV testing. Discussed that this would need to be treated with flagyl. Checked with pharmacist at [facility] and they stated no renal dosing. Will send in flagyl 500 mg BID for 7 days to treat this. Patient voiced understanding.

## 2015-01-03 ENCOUNTER — Other Ambulatory Visit: Payer: Self-pay | Admitting: Internal Medicine

## 2015-01-13 ENCOUNTER — Other Ambulatory Visit: Payer: Self-pay

## 2015-01-13 NOTE — Telephone Encounter (Signed)
Pts pharmacy has sent a refill request.

## 2015-01-14 ENCOUNTER — Other Ambulatory Visit: Payer: Self-pay | Admitting: Internal Medicine

## 2015-01-14 MED ORDER — ALPRAZOLAM 0.5 MG PO TABS: ORAL_TABLET | ORAL | Status: DC

## 2015-01-14 NOTE — Telephone Encounter (Signed)
Ok to refill,  printed rx  

## 2015-01-16 NOTE — Telephone Encounter (Signed)
Pt said it was sent out last night? Not sure want she meant, but she was called and told her written rx was here.

## 2015-01-20 MED ORDER — ALPRAZOLAM 0.5 MG PO TABS: ORAL_TABLET | ORAL | Status: DC

## 2015-01-21 ENCOUNTER — Telehealth: Payer: Self-pay | Admitting: Internal Medicine

## 2015-01-21 NOTE — Telephone Encounter (Signed)
Patient notified and voiced understanding.

## 2015-01-21 NOTE — Telephone Encounter (Signed)
Pt daughter called to let Dr. Darrick Huntsman know that her mother  is having to see a Vein Dr. Rock Nephew daughter [name] would like for you to contact her at [phone].Marland Kitchen She would not give me any more information.Marland Kitchen

## 2015-01-21 NOTE — Telephone Encounter (Signed)
Patient daughter called stated she took patient to kidney doctor ( Dr. Thedore Mins) and the Dr there was recommending having [place] to check veins because patient may require dialysis in the next year. They just wanted to check and patient refused due to primary did not recommend. Patient has an appointment on Friday with MD and daughter would like if MD would advise patient that when it comes to her Kidney's that patient should listen to Dr. Thedore Mins. Patient will not see Dr. Wyn Quaker until MD advises patient and Dr. Ellouise Newer reduce patient spironolactone dosage and patient will not follow those direction until advised by primary.

## 2015-01-21 NOTE — Telephone Encounter (Signed)
Please tell patient that I agree with Dr Thedore Mins,  That's why I sent her to him in the first place!  She needs to follow his advice.

## 2015-01-24 ENCOUNTER — Ambulatory Visit (INDEPENDENT_AMBULATORY_CARE_PROVIDER_SITE_OTHER): Payer: Self-pay | Admitting: Internal Medicine

## 2015-01-24 ENCOUNTER — Ambulatory Visit: Payer: Medicare Other | Admitting: Internal Medicine

## 2015-01-24 DIAGNOSIS — E1165 Type 2 diabetes mellitus with hyperglycemia: Secondary | ICD-10-CM

## 2015-01-24 DIAGNOSIS — E1129 Type 2 diabetes mellitus with other diabetic kidney complication: Secondary | ICD-10-CM

## 2015-01-24 NOTE — Progress Notes (Signed)
Patient failed to keep scheduled appointment and will be charged a no show fee.   

## 2015-01-24 NOTE — Assessment & Plan Note (Signed)
Patient failed to keep scheduled appointment and will be charged a no show fee.   

## 2015-01-27 ENCOUNTER — Encounter: Payer: Self-pay | Admitting: Internal Medicine

## 2015-01-27 ENCOUNTER — Ambulatory Visit (INDEPENDENT_AMBULATORY_CARE_PROVIDER_SITE_OTHER): Payer: Medicare Other | Admitting: Internal Medicine

## 2015-01-27 VITALS — BP 130/48 | HR 63 | Temp 96.0°F | Resp 12 | Ht 64.0 in | Wt 189.5 lb

## 2015-01-27 DIAGNOSIS — N179 Acute kidney failure, unspecified: Secondary | ICD-10-CM

## 2015-01-27 DIAGNOSIS — E1121 Type 2 diabetes mellitus with diabetic nephropathy: Secondary | ICD-10-CM

## 2015-01-27 DIAGNOSIS — N184 Chronic kidney disease, stage 4 (severe): Secondary | ICD-10-CM

## 2015-01-27 DIAGNOSIS — E034 Atrophy of thyroid (acquired): Secondary | ICD-10-CM | POA: Diagnosis not present

## 2015-01-27 DIAGNOSIS — Z91148 Patient's other noncompliance with medication regimen for other reason: Secondary | ICD-10-CM

## 2015-01-27 DIAGNOSIS — Z23 Encounter for immunization: Secondary | ICD-10-CM | POA: Diagnosis not present

## 2015-01-27 DIAGNOSIS — D631 Anemia in chronic kidney disease: Secondary | ICD-10-CM

## 2015-01-27 DIAGNOSIS — E039 Hypothyroidism, unspecified: Secondary | ICD-10-CM

## 2015-01-27 DIAGNOSIS — E038 Other specified hypothyroidism: Secondary | ICD-10-CM | POA: Diagnosis not present

## 2015-01-27 DIAGNOSIS — IMO0002 Reserved for concepts with insufficient information to code with codable children: Secondary | ICD-10-CM

## 2015-01-27 DIAGNOSIS — I872 Venous insufficiency (chronic) (peripheral): Secondary | ICD-10-CM

## 2015-01-27 DIAGNOSIS — E1165 Type 2 diabetes mellitus with hyperglycemia: Secondary | ICD-10-CM | POA: Diagnosis not present

## 2015-01-27 DIAGNOSIS — Z9114 Patient's other noncompliance with medication regimen: Secondary | ICD-10-CM

## 2015-01-27 NOTE — Progress Notes (Signed)
Pre-visit discussion using our clinic review tool. No additional management support is needed unless otherwise documented below in the visit note.  

## 2015-01-27 NOTE — Patient Instructions (Addendum)
Do not take any more lasix (furosemide)   Only take spironolactone on Mondays and Thursday s.  You need to take your insulin ON TIME AND EAT ON TIME  8 AM AND 6 PM  I will adjust your insulin once I see your labs from today

## 2015-01-27 NOTE — Progress Notes (Signed)
Subjective:  Patient ID: [name], female    DOB: Feb 25, 1936  Age: 79 y.o. MRN: [id number]  CC: The primary encounter diagnosis was Uncontrolled type 2 diabetes mellitus with diabetic nephropathy, without long-term current use of insulin (HCC). Diagnoses of Anemia of chronic renal failure, stage 4 (severe) (HCC), Hypothyroidism due to acquired atrophy of thyroid, Encounter for immunization, Venous insufficiency of both lower extremities, Acute renal failure superimposed on stage 4 chronic kidney disease (HCC), Noncompliance with medication treatment due to overuse of medication, and Hypothyroidism (acquired) were also pertinent to this visit.  HPI Ceairra Madara presents for follow up on DM type 2 with chronic kidney disease, and CKD with recent progression/deterioration in GFR. She is accompanied by her daughter.  She has been  taking both diuretics (furosemide and spironolactone)  daily despite my instructions to stop both in July .  She was referred to Dr Thedore Mins who has reduced her spironolactone to twice daily and referred her to Dr Wyn Quaker for venous mapping  In anticipation of need for dialysis .  She has not reduced her diuretic and did not keep the appointment with Dr. Wyn Quaker  She has several unintelligible excuses today to support her medication. Has been using diuretics to treat her LE edema which I have explained to her repeatedly, is not appropriate for management of venous insufficiency.  I have again reiterated that she doe not have heart failure.  She does not want to have to go on dialysis but has not been able to accept responsibility for contributing to her GFR decline by continuing the daily use of diuretics.   She was Started on 12.5 levothyroxine in mid august  DM: BS over 200 < 300  Using 10 units of 70/30  twice daily . Did not bring blood sugar log .     Outpatient Prescriptions Prior to Visit  Medication Sig Dispense Refill  . ACCU-CHEK AVIVA PLUS test strip TEST BLOOD SUGAR  TWO TO THREE TIMES A DAY 100 each 6  . ACCU-CHEK SOFTCLIX LANCETS lancets CHECK BLOOD SUGAR TWICE A DAY 100 each 6  . ALPRAZolam (XANAX) 0.5 MG tablet TAKE ONE TABLET TWICE DAILY AS NEEDED FOR ANXIETY 60 tablet 1  . aspirin EC 81 MG tablet Take 81 mg by mouth daily.    Marland Kitchen atenolol (TENORMIN) 100 MG tablet Take 1 tablet by mouth daily.    . cloNIDine (CATAPRES) 0.1 MG tablet Take 0.1 mg by mouth as needed.     . CRESTOR 20 MG tablet TAKE ONE (1) TABLET EACH DAY 30 tablet 5  . Insulin Isophane & Regular Human (HUMULIN 70/30 KWIKPEN) (70-30) 100 UNIT/ML PEN Take 10 units Twice a day subcutaneously 15 mL 11  . lactulose (CHRONULAC) 10 GM/15ML solution Take 15 mLs (10 g total) by mouth once a week. As needed for constipation 240 mL 1  . levothyroxine (SYNTHROID) 25 MCG tablet Take 0.5 tablets (12.5 mcg total) by mouth daily before breakfast. 45 tablet 1  . NEXIUM 40 MG capsule TAKE ONE (1) CAPSULE EACH DAY BEFORE BREAKFAST 30 capsule 11  . spironolactone (ALDACTONE) 50 MG tablet TAKE ONE (1) TABLET EACH DAY (Patient taking differently: TAKE ONE (1) TABLET EACH DAY, Monday and Thursday) 30 tablet 5  . ULORIC 40 MG tablet TAKE ONE (1) TABLET EACH DAY 30 tablet 5  . ULTICARE SHORT PEN NEEDLES 31G X 8 MM MISC USE WITH INSULIN 100 each 2  . valsartan (DIOVAN) 320 MG tablet TAKE ONE (1) TABLET EACH DAY  30 tablet 5  . ALPRAZolam (XANAX) 0.5 MG tablet TAKE ONE TABLET TWICE DAILY AS NEEDED FOR ANXIETY 60 tablet 1  . atenolol (TENORMIN) 100 MG tablet TAKE ONE TABLET TWICE DAILY 60 tablet 1  . cephALEXin (KEFLEX) 250 MG capsule Take 1 capsule (250 mg total) by mouth 2 (two) times daily. 14 capsule 0  . fluconazole (DIFLUCAN) 150 MG tablet Take 1 tablet (150 mg total) by mouth every 3 (three) days. 2 tablet 0  . metroNIDAZOLE (FLAGYL) 500 MG tablet Take 1 tablet (500 mg total) by mouth 2 (two) times daily. 14 tablet 0   No facility-administered medications prior to visit.    Review of Systems;  Patient  denies headache, fevers, malaise, unintentional weight loss, skin rash, eye pain, sinus congestion and sinus pain, sore throat, dysphagia,  hemoptysis , cough, dyspnea, wheezing, chest pain, palpitations, orthopnea, edema, abdominal pain, nausea, melena, diarrhea, constipation, flank pain, dysuria, hematuria, urinary  Frequency, nocturia, numbness, tingling, seizures,  Focal weakness, Loss of consciousness,  Tremor, insomnia, depression, anxiety, and suicidal ideation.      Objective:  BP 130/48 mmHg  Pulse 63  Temp(Src) 96 F (35.6 C) (Oral)  Resp 12  Ht  (1.626 m)  Wt 189 lb 8 oz (85.957 kg)  BMI 32.51 kg/m2  SpO2 100%  BP Readings from Last 3 Encounters:  01/27/15 130/48  12/12/14 124/72  09/25/14 122/60    Wt Readings from Last 3 Encounters:  01/27/15 189 lb 8 oz (85.957 kg)  12/12/14 188 lb 9.6 oz (85.548 kg)  09/25/14 190 lb (86.183 kg)    General appearance: alert, cooperative and appears stated age Ears: normal TM's and external ear canals both ears Throat: lips, mucosa, and tongue normal; teeth and gums normal Neck: no adenopathy, no carotid bruit, supple, symmetrical, trachea midline and thyroid not enlarged, symmetric, no tenderness/mass/nodules Back: symmetric, no curvature. ROM normal. No CVA tenderness. Lungs: clear to auscultation bilaterally Heart: regular rate and rhythm, S1, S2 normal, no murmur, click, rub or gallop Abdomen: soft, non-tender; bowel sounds normal; no masses,  no organomegaly Pulses: 2+ and symmetric Skin: Skin color, texture, turgor normal. No rashes or lesions Lymph nodes: Cervical, supraclavicular, and axillary nodes normal.  Lab Results  Component Value Date   HGBA1C 7.8* 01/27/2015   HGBA1C 7.0* 10/24/2014   HGBA1C 9.4* 07/17/2014    Lab Results  Component Value Date   CREATININE 3.10* 01/27/2015   CREATININE 2.85* 10/24/2014   CREATININE 2.66* 09/11/2014    Lab Results  Component Value Date   WBC 4.7 12/12/2014   HGB  10.8* 12/12/2014   HCT 31.7* 12/12/2014   PLT 230.0 12/12/2014   GLUCOSE 112* 01/27/2015   CHOL 123 10/24/2014   TRIG 85.0 10/24/2014   HDL 38.40* 10/24/2014   LDLDIRECT 67.0 01/27/2015   LDLCALC 68 10/24/2014   ALT 40* 01/27/2015   AST 39* 01/27/2015   NA 134* 01/27/2015   K 4.8 01/27/2015   CL 98 01/27/2015   CREATININE 3.10* 01/27/2015   BUN 57* 01/27/2015   CO2 21 01/27/2015   TSH 3.23 01/27/2015   INR 1.0 01/27/2013   HGBA1C 7.8* 01/27/2015   MICROALBUR 6.2* 10/24/2014    No results found.  Assessment & Plan:   Problem List Items Addressed This Visit    Type 2 diabetes, uncontrolled, with renal manifestation (HCC) - Primary    Improved with improved compliance with use of insulin twice daily  10 units will increase dose to  12 units  bid. She is on a statin and an ARB  Lab Results  Component Value Date   HGBA1C 7.8* 01/27/2015            Relevant Orders   Hemoglobin A1c (Completed)   LDL cholesterol, direct (Completed)   Comprehensive metabolic panel (Completed)   Venous insufficiency of leg    Explained to her and daughter  again that her VI must be managed with compression stockings and leg elevation.           Acute renal failure superimposed on stage 4 chronic kidney disease (HCC)    Secondary to medication noncompliance and overuse of diuretics. She is no longer taking an ARB.       Noncompliance with medication treatment due to overuse of medication    Advised to stop furosemide  Completely.  rx discontinued in chart.  Recurrent episodes of ARF are due to overuse of diuretics.       Hypothyroidism    Minimal dose started for elevated TSH several weeks ago. Thyroid function is WNL on current dose.  No current changes needed.   Lab Results  Component Value Date   TSH 3.23 01/27/2015         Relevant Orders   TSH (Completed)   RESOLVED: Hypothyroidism (acquired)   Anemia of chronic renal failure, stage 4 (severe) (HCC)    Other Visit  Diagnoses    Encounter for immunization          A total of 40 minutes was spent with patient more than half of which was spent in counseling patient on the above mentioned issues , reviewing and explaining recent labs and imaging studies done, and coordination of care.  I have discontinued Ms. Vink's fluconazole, cephALEXin, metroNIDAZOLE, and furosemide. I am also having her maintain her NEXIUM, cloNIDine, lactulose, aspirin EC, Insulin Isophane & Regular Human, ACCU-CHEK SOFTCLIX LANCETS, atenolol, CRESTOR, ULORIC, spironolactone, ACCU-CHEK AVIVA PLUS, valsartan, levothyroxine, ULTICARE SHORT PEN NEEDLES, and ALPRAZolam.  Meds ordered this encounter  Medications  . DISCONTD: furosemide (LASIX) 40 MG tablet    Sig: Take 40 mg by mouth daily.     Medications Discontinued During This Encounter  Medication Reason  . ALPRAZolam (XANAX) 0.5 MG tablet Duplicate  . atenolol (TENORMIN) 100 MG tablet Duplicate  . cephALEXin (KEFLEX) 250 MG capsule Completed Course  . fluconazole (DIFLUCAN) 150 MG tablet Completed Course  . metroNIDAZOLE (FLAGYL) 500 MG tablet Completed Course  . furosemide (LASIX) 40 MG tablet     Follow-up: No Follow-up on file.   Sherlene Shams, MD

## 2015-01-28 DIAGNOSIS — Z9114 Patient's other noncompliance with medication regimen: Secondary | ICD-10-CM | POA: Insufficient documentation

## 2015-01-28 DIAGNOSIS — E039 Hypothyroidism, unspecified: Secondary | ICD-10-CM | POA: Insufficient documentation

## 2015-01-28 LAB — COMPREHENSIVE METABOLIC PANEL
ALBUMIN: 4.4 g/dL (ref 3.5–5.2)
ALK PHOS: 131 U/L — AB (ref 39–117)
ALT: 40 U/L — ABNORMAL HIGH (ref 0–35)
AST: 39 U/L — ABNORMAL HIGH (ref 0–37)
BUN: 57 mg/dL — ABNORMAL HIGH (ref 6–23)
CALCIUM: 10.5 mg/dL (ref 8.4–10.5)
CHLORIDE: 98 meq/L (ref 96–112)
CO2: 21 mEq/L (ref 19–32)
CREATININE: 3.1 mg/dL — AB (ref 0.40–1.20)
GFR: 18.75 mL/min — ABNORMAL LOW (ref >60.00)
Glucose, Bld: 112 mg/dL — ABNORMAL HIGH (ref 70–99)
POTASSIUM: 4.8 meq/L (ref 3.5–5.1)
Sodium: 134 mEq/L — ABNORMAL LOW (ref 135–145)
Total Bilirubin: 0.2 mg/dL (ref 0.2–1.2)
Total Protein: 8.1 g/dL (ref 6.0–8.3)

## 2015-01-28 LAB — HEMOGLOBIN A1C: Hgb A1c MFr Bld: 7.8 % — ABNORMAL HIGH (ref 4.6–6.5)

## 2015-01-28 LAB — TSH: TSH: 3.23 u[IU]/mL (ref 0.35–4.50)

## 2015-01-28 LAB — LDL CHOLESTEROL, DIRECT: Direct LDL: 67 mg/dL

## 2015-01-28 NOTE — Assessment & Plan Note (Signed)
Minimal dose started for elevated TSH several weeks ago. Thyroid function is WNL on current dose.  No current changes needed.   Lab Results  Component Value Date   TSH 3.23 01/27/2015

## 2015-01-28 NOTE — Assessment & Plan Note (Signed)
Explained to her and daughter  again that her VI must be managed with compression stockings and leg elevation.

## 2015-01-28 NOTE — Assessment & Plan Note (Addendum)
Improved with improved compliance with use of insulin twice daily  10 units will increase dose to  12 units bid. She is on a statin and an ARB  Lab Results  Component Value Date   HGBA1C 7.8* 01/27/2015

## 2015-01-28 NOTE — Assessment & Plan Note (Signed)
Secondary to medication noncompliance and overuse of diuretics. She is no longer taking an ARB.

## 2015-01-28 NOTE — Assessment & Plan Note (Signed)
Advised to stop furosemide  Completely.  rx discontinued in chart.  Recurrent episodes of ARF are due to overuse of diuretics.

## 2015-01-30 ENCOUNTER — Emergency Department: Payer: Medicare Other

## 2015-01-30 ENCOUNTER — Emergency Department
Admission: EM | Admit: 2015-01-30 | Discharge: 2015-01-30 | Disposition: A | Discharge status: Home / Self Care | Payer: Medicare Other | Attending: Emergency Medicine | Admitting: Emergency Medicine

## 2015-01-30 ENCOUNTER — Encounter: Payer: Self-pay | Admitting: *Deleted

## 2015-01-30 DIAGNOSIS — M79604 Pain in right leg: Secondary | ICD-10-CM | POA: Diagnosis present

## 2015-01-30 DIAGNOSIS — N184 Chronic kidney disease, stage 4 (severe): Secondary | ICD-10-CM | POA: Insufficient documentation

## 2015-01-30 DIAGNOSIS — Z7982 Long term (current) use of aspirin: Secondary | ICD-10-CM | POA: Insufficient documentation

## 2015-01-30 DIAGNOSIS — I129 Hypertensive chronic kidney disease with stage 1 through stage 4 chronic kidney disease, or unspecified chronic kidney disease: Secondary | ICD-10-CM | POA: Diagnosis not present

## 2015-01-30 DIAGNOSIS — E1122 Type 2 diabetes mellitus with diabetic chronic kidney disease: Secondary | ICD-10-CM | POA: Diagnosis not present

## 2015-01-30 DIAGNOSIS — Z79899 Other long term (current) drug therapy: Secondary | ICD-10-CM | POA: Diagnosis not present

## 2015-01-30 DIAGNOSIS — M79651 Pain in right thigh: Secondary | ICD-10-CM | POA: Diagnosis not present

## 2015-01-30 DIAGNOSIS — Z794 Long term (current) use of insulin: Secondary | ICD-10-CM | POA: Diagnosis not present

## 2015-01-30 DIAGNOSIS — Z87891 Personal history of nicotine dependence: Secondary | ICD-10-CM | POA: Diagnosis not present

## 2015-01-30 MED ORDER — TRAMADOL HCL 50 MG PO TABS
ORAL_TABLET | ORAL | Status: AC
  Administered 2015-01-30: 50 mg via ORAL
  Filled 2015-01-30: qty 1

## 2015-01-30 MED ORDER — TRAMADOL HCL 50 MG PO TABS
50.0000 mg | ORAL_TABLET | Freq: Once | ORAL | Status: AC
  Administered 2015-01-30: 50 mg via ORAL

## 2015-01-30 MED ORDER — TRAMADOL HCL 50 MG PO TABS: 50.0000 mg | ORAL_TABLET | Freq: Four times a day (QID) | ORAL | Status: DC | PRN

## 2015-01-30 NOTE — ED Notes (Signed)
Pt reports an intermittent cramping in her inner right thigh, reports she has swelling in the right leg (chronic for the pt, "maybe a little more" now).

## 2015-01-30 NOTE — ED Notes (Signed)
Patient transported to US via wheelchair

## 2015-01-30 NOTE — ED Provider Notes (Signed)
[facility] Emergency Department Provider Note  Time seen: 7:18 PM  I have reviewed the triage vital signs and the nursing notes.   HISTORY  Chief Complaint Leg Pain    HPI [name] is a 79 y.o. female with a past medical history of diabetes, gastric reflux, hypertension, arthritis, CHF who presents the emergency department with right leg pain. According to the patient for the past 2 days she has had intermittent right inner thigh pain. States a mild dull aching pain and occasional sharp shooting pain in the area. Denies any falls or trauma. Denies any pain currently, but states it is intermittent and will come and go when it does, can be severe. Patient has been using her walker due to discomfort.    Past Medical History  Diagnosis Date  . Diabetes mellitus   . GERD (gastroesophageal reflux disease)   . Hypertension   . Anemia   . Arthritis   . Hemorrhoids   . Hypertension   . CHF (congestive heart failure) [facility])     Patient Active Problem List   Diagnosis Date Noted  . Noncompliance with medication treatment due to overuse of medication 01/28/2015  . Hypothyroidism 01/28/2015  . Dysuria 08/07/2014  . Back pain of thoracolumbar region 09/23/2013  . Visit for preventive health examination 08/18/2013  . Obesity 07/07/2013  . Depression with anxiety 06/26/2013  . Acute renal failure superimposed on stage 4 chronic kidney disease (HCC) 05/17/2013  . Hemorrhoids 11/06/2012  . Hidradenitis suppurativa 10/05/2012  . Cognitive changes 01/18/2012  . Venous insufficiency of leg 11/24/2011  . Type 2 diabetes, uncontrolled, with renal manifestation (HCC) 10/03/2011  . Anemia of chronic renal failure, stage 4 (severe) (HCC) 10/03/2011  . Chest pain with normal coronary angiography 04/21/2011  . Screening for colon cancer 04/12/2011  . Screening for breast cancer 04/12/2011  . Hypertension 04/12/2011  . Hyperlipidemia LDL goal < 100 04/12/2011  .  Screening for osteoporosis 04/12/2011    Past Surgical History  Procedure Laterality Date  . Cholecystectomy    . Abdominal hysterectomy    . Back surgery    . Arthroplasty w/ arthroscopy medial / lateral compartment knee  Feb 2011    [name]  . Hemorrhoid surgery  12/19/12    Current Outpatient Rx  Name  Route  Sig  Dispense  Refill  . ACCU-CHEK AVIVA PLUS test strip      TEST BLOOD SUGAR TWO TO THREE TIMES A DAY   100 each   6   . ACCU-CHEK SOFTCLIX LANCETS lancets      CHECK BLOOD SUGAR TWICE A DAY   100 each   6   . ALPRAZolam (XANAX) 0.5 MG tablet      TAKE ONE TABLET TWICE DAILY AS NEEDED FOR ANXIETY   60 tablet   1   . aspirin EC 81 MG tablet   Oral   Take 81 mg by mouth daily.         Marland Kitchen atenolol (TENORMIN) 100 MG tablet   Oral   Take 1 tablet by mouth daily.         . cloNIDine (CATAPRES) 0.1 MG tablet   Oral   Take 0.1 mg by mouth as needed.          . CRESTOR 20 MG tablet      TAKE ONE (1) TABLET EACH DAY   30 tablet   5   . Insulin Isophane & Regular Human (HUMULIN 70/30 KWIKPEN) (70-30) 100  UNIT/ML PEN      Take 10 units Twice a day subcutaneously   15 mL   11   . lactulose (CHRONULAC) 10 GM/15ML solution   Oral   Take 15 mLs (10 g total) by mouth once a week. As needed for constipation   240 mL   1   . levothyroxine (SYNTHROID) 25 MCG tablet   Oral   Take 0.5 tablets (12.5 mcg total) by mouth daily before breakfast.   45 tablet   1   . NEXIUM 40 MG capsule      TAKE ONE (1) CAPSULE EACH DAY BEFORE BREAKFAST   30 capsule   11   . ULORIC 40 MG tablet      TAKE ONE (1) TABLET EACH DAY   30 tablet   5   . ULTICARE SHORT PEN NEEDLES 31G X 8 MM MISC      USE WITH INSULIN   100 each   2   . valsartan (DIOVAN) 320 MG tablet      TAKE ONE (1) TABLET EACH DAY   30 tablet   5     Allergies Hydrocodone-acetaminophen and Morphine and related  Family History  Problem Relation Age of Onset  . Heart disease Mother    . Hypertension Mother   . Cancer Mother     Social History Social History  Substance Use Topics  . Smoking status: Former Smoker -- 30 years    Types: Cigarettes    Quit date: 09/28/2001  . Smokeless tobacco: Current User    Types: Snuff  . Alcohol Use: No    Review of Systems Constitutional: Negative for fever. Cardiovascular: Negative for chest pain. Respiratory: Negative for shortness of breath. Gastrointestinal: Negative for abdominal pain Skin: Negative for rash. 10-point ROS otherwise negative.  ____________________________________________   PHYSICAL EXAM:  VITAL SIGNS: ED Triage Vitals  Enc Vitals Group     BP 01/30/15 1710 177/44 mmHg     Pulse Rate 01/30/15 1710 66     Resp 01/30/15 1710 18     Temp 01/30/15 1710 98 F (36.7 C)     Temp Source 01/30/15 1710 Oral     SpO2 01/30/15 1710 100 %     Weight 01/30/15 1710 189 lb (85.73 kg)     Height 01/30/15 1710  (1.626 m)     Head Cir --      Peak Flow --      Pain Score 01/30/15 1710 0     Pain Loc --      Pain Edu? --      Excl. in GC? --     Constitutional: Alert and oriented. Well appearing and in no distress. Eyes: Normal exam ENT   Head: Normocephalic and atraumatic.   Mouth/Throat: Mucous membranes are moist. Cardiovascular: Normal rate, regular rhythm Respiratory: Normal respiratory effort without tachypnea nor retractions. Breath sounds are clear  Gastrointestinal: Soft and nontender. No distention.   Musculoskeletal: Mild tenderness to palpation of the right mid inner thigh. One plus lower extremity edema, patient wears compression stockings. No calf tenderness to palpation. Good range of motion in hip and knee, patient has ambulated without difficulty. Neurologic:  Normal speech and language. No gross focal neurologic deficits  Psychiatric: Mood and affect are normal. Speech and behavior are normal ____________________________________________     RADIOLOGY  Ultrasound  within normal limits. No DVT.  ____________________________________________   INITIAL IMPRESSION / ASSESSMENT AND PLAN / ED COURSE  Pertinent labs &  imaging results that were available during my care of the patient were reviewed by me and considered in my medical decision making (see chart for details).  Patient with pain/discomfort to her right inner thigh 2 days. No trauma. Mild tenderness palpation of the mid inner thigh. No tenderness of the popliteal fossa or calf. We will check an ultrasound to rule out DVT. Patient agreeable to plan. Suspect DVT versus muscular pain.  Ultrasound shows no DVT. I discussed with patient likely musculoskeletal pain, trial of Ultram as needed for pain relief and will prescribe/provide a walker. Patient to follow up with orthopedics for any continued pain.  ____________________________________________   FINAL CLINICAL IMPRESSION(S) / ED DIAGNOSES  Right leg pain   Minna Antis, MD 01/30/15 2217

## 2015-01-30 NOTE — Discharge Instructions (Signed)
Please use your provided walker while experiencing right leg pain for added stability. Please take your pain medication as needed, as prescribed. Please follow-up with orthopedics if you continue to have right leg pain for further evaluation.   Musculoskeletal Pain Musculoskeletal pain is muscle and boney aches and pains. These pains can occur in any part of the body. Your caregiver may treat you without knowing the cause of the pain. They may treat you if blood or urine tests, X-rays, and other tests were normal.  CAUSES There is often not a definite cause or reason for these pains. These pains may be caused by a type of germ (virus). The discomfort may also come from overuse. Overuse includes working out too hard when your body is not fit. Boney aches also come from weather changes. Bone is sensitive to atmospheric pressure changes. HOME CARE INSTRUCTIONS   Ask when your test results will be ready. Make sure you get your test results.  Only take over-the-counter or prescription medicines for pain, discomfort, or fever as directed by your caregiver. If you were given medications for your condition, do not drive, operate machinery or power tools, or sign legal documents for 24 hours. Do not drink alcohol. Do not take sleeping pills or other medications that may interfere with treatment.  Continue all activities unless the activities cause more pain. When the pain lessens, slowly resume normal activities. Gradually increase the intensity and duration of the activities or exercise.  During periods of severe pain, bed rest may be helpful. Lay or sit in any position that is comfortable.  Putting ice on the injured area.  Put ice in a bag.  Place a towel between your skin and the bag.  Leave the ice on for 15 to 20 minutes, 3 to 4 times a day.  Follow up with your caregiver for continued problems and no reason can be found for the pain. If the pain becomes worse or does not go away, it may be  necessary to repeat tests or do additional testing. Your caregiver may need to look further for a possible cause. SEEK IMMEDIATE MEDICAL CARE IF:  You have pain that is getting worse and is not relieved by medications.  You develop chest pain that is associated with shortness or breath, sweating, feeling sick to your stomach (nauseous), or throw up (vomit).  Your pain becomes localized to the abdomen.  You develop any new symptoms that seem different or that concern you. MAKE SURE YOU:   Understand these instructions.  Will watch your condition.  Will get help right away if you are not doing well or get worse.   This information is not intended to replace advice given to you by your health care provider. Make sure you discuss any questions you have with your health care provider.   Document Released: 04/12/2005 Document Revised: 07/05/2011 Document Reviewed: 12/15/2012 Elsevier Interactive Patient Education [employer]

## 2015-02-04 ENCOUNTER — Other Ambulatory Visit: Payer: Self-pay

## 2015-02-04 MED ORDER — CLONIDINE HCL 0.1 MG PO TABS: 0.1000 mg | ORAL_TABLET | ORAL | Status: DC | PRN

## 2015-02-06 ENCOUNTER — Telehealth: Payer: Self-pay | Admitting: Internal Medicine

## 2015-02-06 NOTE — Telephone Encounter (Signed)
Left Message for Theodoro DoingKrysta to call me back

## 2015-02-06 NOTE — Telephone Encounter (Signed)
Theodoro DoingKrysta called back and gave BP from 10/3 office visit.

## 2015-02-06 NOTE — Telephone Encounter (Signed)
Theodoro DoingKrysta nurse from [facility] called to get pt last BP? Please call her at [phone]. Thank You!

## 2015-02-17 ENCOUNTER — Other Ambulatory Visit: Payer: Self-pay

## 2015-02-17 MED ORDER — ESOMEPRAZOLE MAGNESIUM 40 MG PO CPDR
DELAYED_RELEASE_CAPSULE | ORAL | Status: DC
Start: 2015-02-17 — End: 2015-05-15

## 2015-02-20 ENCOUNTER — Ambulatory Visit (INDEPENDENT_AMBULATORY_CARE_PROVIDER_SITE_OTHER): Payer: Self-pay | Admitting: Internal Medicine

## 2015-02-20 DIAGNOSIS — E1165 Type 2 diabetes mellitus with hyperglycemia: Secondary | ICD-10-CM

## 2015-02-20 DIAGNOSIS — Z794 Long term (current) use of insulin: Secondary | ICD-10-CM

## 2015-02-20 DIAGNOSIS — E1121 Type 2 diabetes mellitus with diabetic nephropathy: Secondary | ICD-10-CM

## 2015-02-22 NOTE — Progress Notes (Signed)
Patient failed to keep scheduled appointment and will be charged a no show fee.   

## 2015-02-22 NOTE — Assessment & Plan Note (Signed)
Patient failed to keep scheduled appointment and will be charged a no show fee.   

## 2015-03-03 ENCOUNTER — Other Ambulatory Visit: Payer: Self-pay

## 2015-03-03 MED ORDER — INSULIN PEN NEEDLE 31G X 8 MM MISC: Status: DC

## 2015-03-07 ENCOUNTER — Other Ambulatory Visit: Payer: Self-pay

## 2015-03-07 ENCOUNTER — Ambulatory Visit: Payer: Medicare Other | Admitting: Internal Medicine

## 2015-03-07 MED ORDER — ATENOLOL 100 MG PO TABS: 100.0000 mg | ORAL_TABLET | Freq: Every day | ORAL | Status: DC

## 2015-03-14 ENCOUNTER — Ambulatory Visit (INDEPENDENT_AMBULATORY_CARE_PROVIDER_SITE_OTHER): Payer: Medicare Other | Admitting: Internal Medicine

## 2015-03-14 VITALS — BP 162/50 | HR 73 | Temp 98.0°F | Resp 14 | Ht 64.0 in | Wt 191.8 lb

## 2015-03-14 DIAGNOSIS — Z794 Long term (current) use of insulin: Secondary | ICD-10-CM

## 2015-03-14 DIAGNOSIS — I10 Essential (primary) hypertension: Secondary | ICD-10-CM

## 2015-03-14 DIAGNOSIS — E038 Other specified hypothyroidism: Secondary | ICD-10-CM

## 2015-03-14 DIAGNOSIS — N184 Chronic kidney disease, stage 4 (severe): Secondary | ICD-10-CM

## 2015-03-14 DIAGNOSIS — E1121 Type 2 diabetes mellitus with diabetic nephropathy: Secondary | ICD-10-CM

## 2015-03-14 DIAGNOSIS — E034 Atrophy of thyroid (acquired): Secondary | ICD-10-CM

## 2015-03-14 DIAGNOSIS — D631 Anemia in chronic kidney disease: Secondary | ICD-10-CM

## 2015-03-14 DIAGNOSIS — IMO0002 Reserved for concepts with insufficient information to code with codable children: Secondary | ICD-10-CM

## 2015-03-14 DIAGNOSIS — E1165 Type 2 diabetes mellitus with hyperglycemia: Secondary | ICD-10-CM

## 2015-03-14 MED ORDER — CARVEDILOL 3.125 MG PO TABS: 3.1250 mg | ORAL_TABLET | Freq: Two times a day (BID) | ORAL | Status: DC

## 2015-03-14 NOTE — Patient Instructions (Addendum)
You need to take your insulin not more than 30 minutes before you morning and evening meals or as soon after you eat as possible, not at bedtime     You should add an ounce of cheese or some peanut butter with your afternoon apple  Diet sodas do NOT have sugar  In them but they have sodium  So limit them to once daily    I want you to start taking carvedilol two times daily for your blood pressure  You can continue using the clonidine at bedtime or if your bp is too high   You can take up to 4 tylenol every day safely (available OTC as acetominophen)  For a total of 2000 mg daily

## 2015-03-14 NOTE — Progress Notes (Signed)
Subjective:  Patient ID: [name], female    DOB: Oct 18, 1935  Age: 79 y.o. MRN: [id number]  CC: The primary encounter diagnosis was Uncontrolled type 2 diabetes mellitus with diabetic nephropathy, with long-term current use of insulin (HCC). Diagnoses of Hypothyroidism due to acquired atrophy of thyroid, Essential hypertension, and Anemia of chronic renal failure, stage 4 (severe) (HCC) were also pertinent to this visit.  HPI [name] presents for follow up on type 2 DM. She has not been following a low GI diet.  She continues to refuse  referral to Diabetes Education for nutritional education, but remain confused about what is low glycemic index and takes her insulin after eating instead of before.     Mornings 130-140   Post prandials lunch 170-80  And 10 pm sugars 220  Currently taking 10 units wice daily of 70/30 insulin  but occasionally increases it  To 15 units.  Hypertension:  She has not been taking clonidine  Except at night because it is sedating    Lab Results  Component Value Date   HGBA1C 7.8* 01/27/2015     Outpatient Prescriptions Prior to Visit  Medication Sig Dispense Refill  . ACCU-CHEK AVIVA PLUS test strip TEST BLOOD SUGAR TWO TO THREE TIMES A DAY 100 each 6  . ACCU-CHEK SOFTCLIX LANCETS lancets CHECK BLOOD SUGAR TWICE A DAY 100 each 6  . ALPRAZolam (XANAX) 0.5 MG tablet TAKE ONE TABLET TWICE DAILY AS NEEDED FOR ANXIETY 60 tablet 1  . aspirin EC 81 MG tablet Take 81 mg by mouth daily.    Marland Kitchen. atenolol (TENORMIN) 100 MG tablet Take 1 tablet (100 mg total) by mouth daily. 60 tablet 0  . cloNIDine (CATAPRES) 0.1 MG tablet Take 1 tablet (0.1 mg total) by mouth as needed. 60 tablet 1  . CRESTOR 20 MG tablet TAKE ONE (1) TABLET EACH DAY 30 tablet 5  . esomeprazole (NEXIUM) 40 MG capsule TAKE ONE (1) CAPSULE EACH DAY BEFORE BREAKFAST 30 capsule 11  . Insulin Isophane & Regular Human (HUMULIN 70/30 KWIKPEN) (70-30) 100 UNIT/ML PEN Take 10 units Twice a day  subcutaneously 15 mL 11  . Insulin Pen Needle (ULTICARE SHORT PEN NEEDLES) 31G X 8 MM MISC USE WITH INSULIN 100 each 12  . lactulose (CHRONULAC) 10 GM/15ML solution Take 15 mLs (10 g total) by mouth once a week. As needed for constipation 240 mL 1  . levothyroxine (SYNTHROID) 25 MCG tablet Take 0.5 tablets (12.5 mcg total) by mouth daily before breakfast. 45 tablet 1  . traMADol (ULTRAM) 50 MG tablet Take 1 tablet (50 mg total) by mouth every 6 (six) hours as needed. 20 tablet 0  . ULORIC 40 MG tablet TAKE ONE (1) TABLET EACH DAY 30 tablet 5  . valsartan (DIOVAN) 320 MG tablet TAKE ONE (1) TABLET EACH DAY 30 tablet 5   No facility-administered medications prior to visit.    Review of Systems;  Patient denies headache, fevers, malaise, unintentional weight loss, skin rash, eye pain, sinus congestion and sinus pain, sore throat, dysphagia,  hemoptysis , cough, dyspnea, wheezing, chest pain, palpitations, orthopnea, edema, abdominal pain, nausea, melena, diarrhea, constipation, flank pain, dysuria, hematuria, urinary  Frequency, nocturia, numbness, tingling, seizures,  Focal weakness, Loss of consciousness,  Tremor, insomnia, depression, anxiety, and suicidal ideation.      Objective:  BP 162/50 mmHg  Pulse 73  Temp(Src) 98 F (36.7 C)  Resp 14  Ht 5\' 4"  (1.626 m)  Wt 191 lb 12.8 oz (87  kg)  BMI 32.91 kg/m2  SpO2 97%  BP Readings from Last 3 Encounters:  03/14/15 162/50  01/30/15 177/44  01/27/15 130/48    Wt Readings from Last 3 Encounters:  03/14/15 191 lb 12.8 oz (87 kg)  01/30/15 189 lb (85.73 kg)  01/27/15 189 lb 8 oz (85.957 kg)    General appearance: alert, cooperative and appears stated age Ears: normal TM's and external ear canals both ears Throat: lips, mucosa, and tongue normal; teeth and gums normal Neck: no adenopathy, no carotid bruit, supple, symmetrical, trachea midline and thyroid not enlarged, symmetric, no tenderness/mass/nodules Back: symmetric, no  curvature. ROM normal. No CVA tenderness. Lungs: clear to auscultation bilaterally Heart: regular rate and rhythm, S1, S2 normal, no murmur, click, rub or gallop Abdomen: soft, non-tender; bowel sounds normal; no masses,  no organomegaly Pulses: 2+ and symmetric Skin: Skin color, texture, turgor normal. No rashes or lesions Lymph nodes: Cervical, supraclavicular, and axillary nodes normal.  Lab Results  Component Value Date   HGBA1C 7.8* 01/27/2015   HGBA1C 7.0* 10/24/2014   HGBA1C 9.4* 07/17/2014    Lab Results  Component Value Date   CREATININE 3.10* 01/27/2015   CREATININE 2.85* 10/24/2014   CREATININE 2.66* 09/11/2014    Lab Results  Component Value Date   WBC 4.7 12/12/2014   HGB 10.8* 12/12/2014   HCT 31.7* 12/12/2014   PLT 230.0 12/12/2014   GLUCOSE 112* 01/27/2015   CHOL 123 10/24/2014   TRIG 85.0 10/24/2014   HDL 38.40* 10/24/2014   LDLDIRECT 67.0 01/27/2015   LDLCALC 68 10/24/2014   ALT 40* 01/27/2015   AST 39* 01/27/2015   NA 134* 01/27/2015   K 4.8 01/27/2015   CL 98 01/27/2015   CREATININE 3.10* 01/27/2015   BUN 57* 01/27/2015   CO2 21 01/27/2015   TSH 3.23 01/27/2015   INR 1.0 01/27/2013   HGBA1C 7.8* 01/27/2015   MICROALBUR 6.2* 10/24/2014    US Venous Img Lower Unilateral Right  01/30/2015  CLINICAL DATA:  RIGHT THIGH PAIN X1 DAY EXAM: RIGHT LOWER EXTREMITY VENOUS DOPPLER ULTRASOUND TECHNIQUE: Gray-scale sonography with compression, as well as color and duplex ultrasound, were performed to evaluate the deep venous system from the level of the common femoral vein through the popliteal and proximal calf veins. COMPARISON:  03/07/2007 BY REPORT ONLY FINDINGS: Normal compressibility of the common femoral, superficial femoral, and popliteal veins, as well as the proximal calf veins. No filling defects to suggest DVT on grayscale or color Doppler imaging. Doppler waveforms show normal direction of venous flow, normal respiratory phasicity and response to  augmentation. Survey views of the contralateral common femoral vein are unremarkable. IMPRESSION: 1. No evidence of lower extremity deep vein thrombosis, RIGHT. Electronically Signed   By: Corlis Leak M.D.   On: 01/30/2015 22:00    Assessment & Plan:   Problem List Items Addressed This Visit    Hypertension    Not  Controlled due to medication nonadherence /intolerance to clonidine. .  Adding carvedilol twice daily         Relevant Medications   carvedilol (COREG) 3.125 MG tablet   Type 2 diabetes, uncontrolled, with renal manifestation (HCC) - Primary    She did not increase her dose after last visit to 12 units bid. Spent 15 minutes,  Reviewing proper use of insulin and low glycemic diet  .  She is on a statin and an ARB  Lab Results  Component Value Date   HGBA1C 7.8* 01/27/2015   Lab  Results  Component Value Date   MICROALBUR 6.2* 10/24/2014               Relevant Orders   Microalbumin / creatinine urine ratio   Hemoglobin A1c   Lipid panel   Anemia of chronic renal failure, stage 4 (severe) (HCC)    Her hemoglobin has improved .She will follow up with nephrology every 3 months for consideration of iron infusions. Lab Results  Component Value Date   HGB 10.8* 12/12/2014           Hypothyroidism    Thyroid function is WNL on current dose.  No current changes needed.   Lab Results  Component Value Date   TSH 3.23 01/27/2015         Relevant Medications   carvedilol (COREG) 3.125 MG tablet   Other Relevant Orders   TSH      I am having Ms. Ales start on carvedilol. I am also having her maintain her lactulose, aspirin EC, Insulin Isophane & Regular Human, ACCU-CHEK SOFTCLIX LANCETS, CRESTOR, ULORIC, ACCU-CHEK AVIVA PLUS, valsartan, levothyroxine, ALPRAZolam, traMADol, cloNIDine, esomeprazole, Insulin Pen Needle, and atenolol.  Meds ordered this encounter  Medications  . carvedilol (COREG) 3.125 MG tablet    Sig: Take 1 tablet (3.125 mg total) by  mouth 2 (two) times daily with a meal.    Dispense:  60 tablet    Refill:  2    There are no discontinued medications.  Follow-up: Return in about 3 months (around 06/14/2015).   Sherlene Shams, MD

## 2015-03-14 NOTE — Progress Notes (Signed)
Pre visit review using our clinic review tool, if applicable. No additional management support is needed unless otherwise documented below in the visit note. 

## 2015-03-16 ENCOUNTER — Encounter: Payer: Self-pay | Admitting: Internal Medicine

## 2015-03-16 NOTE — Assessment & Plan Note (Signed)
Thyroid function is WNL on current dose.  No current changes needed.   Lab Results  Component Value Date   TSH 3.23 01/27/2015

## 2015-03-16 NOTE — Assessment & Plan Note (Signed)
Her hemoglobin has improved .She will follow up with nephrology every 3 months for consideration of iron infusions. Lab Results  Component Value Date   HGB 10.8* 12/12/2014

## 2015-03-16 NOTE — Assessment & Plan Note (Signed)
She did not increase her dose after last visit to 12 units bid. Spent 15 minutes,  Reviewing proper use of insulin and low glycemic diet  .  She is on a statin and an ARB  Lab Results  Component Value Date   HGBA1C 7.8* 01/27/2015   Lab Results  Component Value Date   MICROALBUR 6.2* 10/24/2014

## 2015-03-16 NOTE — Assessment & Plan Note (Signed)
Not  Controlled due to medication nonadherence /intolerance to clonidine. .  Adding carvedilol twice daily

## 2015-03-25 ENCOUNTER — Telehealth: Payer: Self-pay

## 2015-04-01 ENCOUNTER — Other Ambulatory Visit: Payer: Self-pay | Admitting: Internal Medicine

## 2015-04-01 ENCOUNTER — Telehealth: Payer: Self-pay

## 2015-04-01 MED ORDER — ALPRAZOLAM 0.5 MG PO TABS: ORAL_TABLET | ORAL | Status: DC

## 2015-04-01 NOTE — Telephone Encounter (Signed)
Refill faxed

## 2015-04-01 NOTE — Telephone Encounter (Signed)
Pt is requesting  Alprazolam 0.5mg 

## 2015-04-01 NOTE — Telephone Encounter (Signed)
Ok to refill,  printed rx  

## 2015-04-01 NOTE — Telephone Encounter (Signed)
Please advise ok to fill patient is taking BID for anxiety PRN, ok to fill last fill 01/20/15 60 with one refill.

## 2015-04-02 ENCOUNTER — Telehealth: Payer: Self-pay | Admitting: Internal Medicine

## 2015-04-02 ENCOUNTER — Other Ambulatory Visit: Payer: Self-pay

## 2015-04-02 MED ORDER — ATENOLOL 100 MG PO TABS: 100.0000 mg | ORAL_TABLET | Freq: Every day | ORAL | Status: DC

## 2015-04-02 MED ORDER — VALSARTAN 320 MG PO TABS: 320.0000 mg | ORAL_TABLET | Freq: Every day | ORAL | Status: DC

## 2015-04-02 MED ORDER — ROSUVASTATIN CALCIUM 20 MG PO TABS: 20.0000 mg | ORAL_TABLET | Freq: Every day | ORAL | Status: DC

## 2015-04-02 NOTE — Telephone Encounter (Signed)
Done

## 2015-04-02 NOTE — Telephone Encounter (Signed)
Pt called about needing medication refill for valsartan (DIOVAN) 320 MG tablet,atenolol (TENORMIN) 100 MG tablet,CRESTOR 20 MG tablet.  Pharmacy is [facility] - [address] Thank You!

## 2015-04-03 NOTE — Telephone Encounter (Signed)
A Xanax prescription has been requested for patient. Please Advise

## 2015-04-03 NOTE — Telephone Encounter (Signed)
Prescription for Xanax was faxed on the 6th for this patient.

## 2015-04-24 ENCOUNTER — Telehealth: Payer: Self-pay | Admitting: Internal Medicine

## 2015-04-24 NOTE — Telephone Encounter (Signed)
Pt daughter called about being concerned for her mom regarding mom calling the police thinking someone is breaking in her house,someone doing stuff to her food and hiding things so she cannot find it. Daughter is concerned on what to do. Thank You!

## 2015-04-24 NOTE — Telephone Encounter (Signed)
Spoke with daughter.  Reports mom not being a danger to herself or others, but extremely paranoid.  PCP made aware.  Following up with an appointment.

## 2015-04-30 ENCOUNTER — Ambulatory Visit (INDEPENDENT_AMBULATORY_CARE_PROVIDER_SITE_OTHER): Payer: Medicare Other | Admitting: Internal Medicine

## 2015-04-30 ENCOUNTER — Encounter: Payer: Self-pay | Admitting: Internal Medicine

## 2015-04-30 VITALS — BP 140/72 | HR 64 | Temp 98.1°F | Resp 12 | Ht 64.0 in | Wt 192.8 lb

## 2015-04-30 DIAGNOSIS — E1121 Type 2 diabetes mellitus with diabetic nephropathy: Secondary | ICD-10-CM

## 2015-04-30 DIAGNOSIS — I10 Essential (primary) hypertension: Secondary | ICD-10-CM

## 2015-04-30 DIAGNOSIS — Z794 Long term (current) use of insulin: Secondary | ICD-10-CM

## 2015-04-30 DIAGNOSIS — E1165 Type 2 diabetes mellitus with hyperglycemia: Secondary | ICD-10-CM | POA: Diagnosis not present

## 2015-04-30 DIAGNOSIS — F600 Paranoid personality disorder: Secondary | ICD-10-CM

## 2015-04-30 DIAGNOSIS — IMO0002 Reserved for concepts with insufficient information to code with codable children: Secondary | ICD-10-CM

## 2015-04-30 DIAGNOSIS — F05 Delirium due to known physiological condition: Secondary | ICD-10-CM | POA: Diagnosis not present

## 2015-04-30 DIAGNOSIS — Z13820 Encounter for screening for osteoporosis: Secondary | ICD-10-CM

## 2015-04-30 DIAGNOSIS — R4189 Other symptoms and signs involving cognitive functions and awareness: Secondary | ICD-10-CM

## 2015-04-30 DIAGNOSIS — E038 Other specified hypothyroidism: Secondary | ICD-10-CM

## 2015-04-30 DIAGNOSIS — N183 Chronic kidney disease, stage 3 unspecified: Secondary | ICD-10-CM

## 2015-04-30 DIAGNOSIS — E785 Hyperlipidemia, unspecified: Secondary | ICD-10-CM

## 2015-04-30 DIAGNOSIS — E034 Atrophy of thyroid (acquired): Secondary | ICD-10-CM

## 2015-04-30 DIAGNOSIS — R410 Disorientation, unspecified: Secondary | ICD-10-CM

## 2015-04-30 LAB — COMPREHENSIVE METABOLIC PANEL
ALK PHOS: 108 U/L (ref 39–117)
ALT: 16 U/L (ref 0–35)
AST: 16 U/L (ref 0–37)
Albumin: 3.5 g/dL (ref 3.5–5.2)
BUN: 19 mg/dL (ref 6–23)
CO2: 27 mEq/L (ref 19–32)
Calcium: 9.5 mg/dL (ref 8.4–10.5)
Chloride: 106 mEq/L (ref 96–112)
Creatinine, Ser: 1.69 mg/dL — ABNORMAL HIGH (ref 0.40–1.20)
GFR: 37.73 mL/min — AB (ref >60.00)
GLUCOSE: 157 mg/dL — AB (ref 70–99)
POTASSIUM: 4.2 meq/L (ref 3.5–5.1)
Sodium: 138 mEq/L (ref 135–145)
TOTAL PROTEIN: 6.6 g/dL (ref 6.0–8.3)
Total Bilirubin: 0.3 mg/dL (ref 0.2–1.2)

## 2015-04-30 LAB — HEMOGLOBIN A1C: Hgb A1c MFr Bld: 7.3 % — ABNORMAL HIGH (ref 4.6–6.5)

## 2015-04-30 LAB — LDL CHOLESTEROL, DIRECT: Direct LDL: 65 mg/dL

## 2015-04-30 NOTE — Patient Instructions (Signed)
I think you should get a pill box that has two boxes per day for keeping your medications straight  Please consider letting me prescribe a medication to help you relax and sleep better at night.    The alprazolam is not the best choice for you anymore for  your insomnia

## 2015-04-30 NOTE — Progress Notes (Signed)
Pre-visit discussion using our clinic review tool. No additional management support is needed unless otherwise documented below in the visit note.  
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
## 2015-05-02 ENCOUNTER — Other Ambulatory Visit: Payer: Self-pay | Admitting: Internal Medicine

## 2015-05-02 ENCOUNTER — Other Ambulatory Visit: Payer: Self-pay

## 2015-05-02 MED ORDER — ATENOLOL 100 MG PO TABS: 100.0000 mg | ORAL_TABLET | Freq: Every day | ORAL | Status: DC

## 2015-05-02 MED ORDER — LEVOTHYROXINE SODIUM 25 MCG PO TABS: 12.5000 ug | ORAL_TABLET | Freq: Every day | ORAL | Status: DC

## 2015-05-03 DIAGNOSIS — N183 Chronic kidney disease, stage 3 unspecified: Secondary | ICD-10-CM | POA: Insufficient documentation

## 2015-05-03 NOTE — Assessment & Plan Note (Signed)
Secondary to DM and hypertension,  Continue follow [facility],  Avoid NSAIDs and other nephrotoxins. Prior episodes of ARF on CKD were due to patient's inappropriate use of furosemide for lower extremity edema due to venous insufficiency.

## 2015-05-03 NOTE — Assessment & Plan Note (Addendum)
Patient appears to be having paranoid delusions, but does not appear incompetent.  She is a very difficult historian, and hs a histrionic personality ;  there is very little I can do to encourage her to change from an independent living to assisted living, as her daughter would prefer . She does appear to exhibit early FTD.  Referral to neurology requested by family for further evaluation

## 2015-05-03 NOTE — Assessment & Plan Note (Signed)
Uncontrolled secondary to medication noncompliance per daughter.  Patient refuses help from outside. , Suggested patient obtain a pill box with two daily boxes to improve administration.

## 2015-05-03 NOTE — Assessment & Plan Note (Signed)
LDL and triglycerides are at goal on current medications. Direct LDL is 65 today . She has no side effects and liver enzymes are normal. No changes today  Lab Results  Component Value Date   CHOL 123 10/24/2014   HDL 38.40* 10/24/2014   LDLCALC 68 10/24/2014   LDLDIRECT 65.0 04/30/2015   TRIG 85.0 10/24/2014   CHOLHDL 3 10/24/2014    Lab Results  Component Value Date   ALT 16 04/30/2015   AST 16 04/30/2015   ALKPHOS 108 04/30/2015   BILITOT 0.3 04/30/2015

## 2015-05-03 NOTE — Assessment & Plan Note (Addendum)
Improving despite her refusal to  increase her dose after last visit to 12 units bid. Still using 10 units and adjusting to 15 units pern CBG > 200, as best I can tell fron her rambling discussion . NO changes today given A1c of 7.3.   She is on a statin and an ARB.  Urine was requested but patient was not escorted to the bathroom by staff so it was not obtained.   Lab Results  Component Value Date   HGBA1C 7.3* 04/30/2015   Lab Results  Component Value Date   MICROALBUR 6.2* 10/24/2014   Lab Results  Component Value Date   CREATININE 1.69* 04/30/2015

## 2015-05-05 ENCOUNTER — Encounter: Payer: Self-pay | Admitting: *Deleted

## 2015-05-07 ENCOUNTER — Other Ambulatory Visit: Payer: Medicare Other

## 2015-05-07 DIAGNOSIS — E1165 Type 2 diabetes mellitus with hyperglycemia: Principal | ICD-10-CM

## 2015-05-07 DIAGNOSIS — E1121 Type 2 diabetes mellitus with diabetic nephropathy: Secondary | ICD-10-CM

## 2015-05-07 DIAGNOSIS — Z794 Long term (current) use of insulin: Principal | ICD-10-CM

## 2015-05-07 DIAGNOSIS — IMO0002 Reserved for concepts with insufficient information to code with codable children: Secondary | ICD-10-CM

## 2015-05-07 LAB — URINALYSIS, ROUTINE W REFLEX MICROSCOPIC
Bilirubin Urine: NEGATIVE
KETONES UR: NEGATIVE
Leukocytes, UA: NEGATIVE
NITRITE: NEGATIVE
SPECIFIC GRAVITY, URINE: 1.01 (ref 1.000–1.030)
TOTAL PROTEIN, URINE-UPE24: 100 — AB
URINE GLUCOSE: NEGATIVE
UROBILINOGEN UA: 0.2 (ref 0.0–1.0)
pH: 6 (ref 5.0–8.0)

## 2015-05-07 LAB — MICROALBUMIN / CREATININE URINE RATIO
Creatinine,U: 83.6 mg/dL
MICROALB UR: 79.4 mg/dL — AB (ref 0.0–1.9)
Microalb Creat Ratio: 95 mg/g — ABNORMAL HIGH (ref 0.0–30.0)

## 2015-05-08 LAB — URINE CULTURE
Colony Count: NO GROWTH
ORGANISM ID, BACTERIA: NO GROWTH

## 2015-05-09 LAB — T4 AND TSH
T4 TOTAL: 4.4 ug/dL — AB (ref 4.5–12.0)
TSH: 9.14 u[IU]/mL — AB (ref 0.450–4.500)

## 2015-05-13 ENCOUNTER — Other Ambulatory Visit: Payer: Self-pay | Admitting: Internal Medicine

## 2015-05-13 MED ORDER — LEVOTHYROXINE SODIUM 25 MCG PO TABS: 25.0000 ug | ORAL_TABLET | Freq: Every day | ORAL | Status: DC

## 2015-05-15 ENCOUNTER — Emergency Department: Payer: Medicare Other

## 2015-05-15 ENCOUNTER — Encounter: Payer: Self-pay | Admitting: Emergency Medicine

## 2015-05-15 ENCOUNTER — Emergency Department
Admission: EM | Admit: 2015-05-15 | Discharge: 2015-05-15 | Disposition: A | Discharge status: Home / Self Care | Payer: Medicare Other | Attending: Emergency Medicine | Admitting: Emergency Medicine

## 2015-05-15 DIAGNOSIS — N184 Chronic kidney disease, stage 4 (severe): Secondary | ICD-10-CM | POA: Diagnosis not present

## 2015-05-15 DIAGNOSIS — R1011 Right upper quadrant pain: Secondary | ICD-10-CM | POA: Diagnosis present

## 2015-05-15 DIAGNOSIS — I129 Hypertensive chronic kidney disease with stage 1 through stage 4 chronic kidney disease, or unspecified chronic kidney disease: Secondary | ICD-10-CM | POA: Insufficient documentation

## 2015-05-15 DIAGNOSIS — R1013 Epigastric pain: Secondary | ICD-10-CM | POA: Insufficient documentation

## 2015-05-15 DIAGNOSIS — Z87891 Personal history of nicotine dependence: Secondary | ICD-10-CM | POA: Diagnosis not present

## 2015-05-15 DIAGNOSIS — Z79899 Other long term (current) drug therapy: Secondary | ICD-10-CM | POA: Diagnosis not present

## 2015-05-15 DIAGNOSIS — Z7982 Long term (current) use of aspirin: Secondary | ICD-10-CM | POA: Insufficient documentation

## 2015-05-15 DIAGNOSIS — E1122 Type 2 diabetes mellitus with diabetic chronic kidney disease: Secondary | ICD-10-CM | POA: Diagnosis not present

## 2015-05-15 DIAGNOSIS — Z794 Long term (current) use of insulin: Secondary | ICD-10-CM | POA: Insufficient documentation

## 2015-05-15 DIAGNOSIS — R1032 Left lower quadrant pain: Secondary | ICD-10-CM | POA: Insufficient documentation

## 2015-05-15 LAB — COMPREHENSIVE METABOLIC PANEL
ALK PHOS: 146 U/L — AB (ref 38–126)
ALT: 26 U/L (ref 14–54)
AST: 24 U/L (ref 15–41)
Albumin: 3.4 g/dL — ABNORMAL LOW (ref 3.5–5.0)
Anion gap: 3 — ABNORMAL LOW (ref 5–15)
BUN: 15 mg/dL (ref 6–20)
CALCIUM: 9.1 mg/dL (ref 8.9–10.3)
CHLORIDE: 108 mmol/L (ref 101–111)
CO2: 25 mmol/L (ref 22–32)
CREATININE: 1.71 mg/dL — AB (ref 0.44–1.00)
GFR calc Af Amer: 32 mL/min — ABNORMAL LOW (ref >60)
GFR, EST NON AFRICAN AMERICAN: 28 mL/min — AB (ref >60)
Glucose, Bld: 133 mg/dL — ABNORMAL HIGH (ref 65–99)
Potassium: 4.3 mmol/L (ref 3.5–5.1)
Sodium: 136 mmol/L (ref 135–145)
Total Bilirubin: <0.1 mg/dL — ABNORMAL LOW (ref 0.3–1.2)
Total Protein: 7.2 g/dL (ref 6.5–8.1)

## 2015-05-15 LAB — URINALYSIS COMPLETE WITH MICROSCOPIC (ARMC ONLY)
BILIRUBIN URINE: NEGATIVE
Glucose, UA: NEGATIVE mg/dL
KETONES UR: NEGATIVE mg/dL
Leukocytes, UA: NEGATIVE
Nitrite: NEGATIVE
PH: 6 (ref 5.0–8.0)
PROTEIN: 100 mg/dL — AB
SPECIFIC GRAVITY, URINE: 1.003 — AB (ref 1.005–1.030)

## 2015-05-15 LAB — CBC
HCT: 34.6 % — ABNORMAL LOW (ref 35.0–47.0)
Hemoglobin: 11.9 g/dL — ABNORMAL LOW (ref 12.0–16.0)
MCH: 31.9 pg (ref 26.0–34.0)
MCHC: 34.4 g/dL (ref 32.0–36.0)
MCV: 92.8 fL (ref 80.0–100.0)
PLATELETS: 223 10*3/uL (ref 150–440)
RBC: 3.73 MIL/uL — ABNORMAL LOW (ref 3.80–5.20)
RDW: 12.5 % (ref 11.5–14.5)
WBC: 5.8 10*3/uL (ref 3.6–11.0)

## 2015-05-15 LAB — LIPASE, BLOOD: Lipase: 42 U/L (ref 11–51)

## 2015-05-15 MED ORDER — GI COCKTAIL ~~LOC~~
30.0000 mL | Freq: Once | ORAL | Status: AC
  Administered 2015-05-15: 30 mL via ORAL
  Filled 2015-05-15: qty 30

## 2015-05-15 MED ORDER — IOHEXOL 240 MG/ML SOLN
25.0000 mL | INTRAMUSCULAR | Status: AC
  Administered 2015-05-15: 25 mL via ORAL

## 2015-05-15 MED ORDER — OMEPRAZOLE 40 MG PO CPDR: 40.0000 mg | DELAYED_RELEASE_CAPSULE | Freq: Every day | ORAL | Status: AC

## 2015-05-15 MED ORDER — CLONIDINE HCL 0.1 MG PO TABS
0.2000 mg | ORAL_TABLET | Freq: Once | ORAL | Status: AC
  Administered 2015-05-15: 0.2 mg via ORAL
  Filled 2015-05-15: qty 2

## 2015-05-15 NOTE — Discharge Instructions (Signed)
Please return to the emergency department if you develop worsening abdominal pain, nausea or vomiting, fever, or any other symptoms concerning to you.

## 2015-05-15 NOTE — ED Notes (Signed)
Pt arrived from home by EMS with c/o of upper quadrant abdominal pain. EMS reports pt ambulatory to stretcher.  Pt states she as had the pain for 24 hours and has had no relief. Pt also states she thinks there is a funny odor in her apartment, "some type of fumes."

## 2015-05-15 NOTE — ED Notes (Signed)
Discussed discharge instructions, prescriptions, and follow-up care with patient. No questions or concerns at this time. Pt stable at discharge. Pt calling daughter to come and pick her up.

## 2015-05-15 NOTE — ED Provider Notes (Signed)
80 y.o. female with a history of hypertension presenting with epigastric pain that started several days ago but got acutely worse last night. Worse when she lays down. The patient denies any fever. The patient's labs are reassuring and her CT abdomen does not show any acute process. She does have a hiatal hernia and is possible that she is having a symptomatic hiatal hernia. In the past, the patient has been diagnosed with GERD but is currently not being treated for this.  I have reexamined the patient, and she has isolated mild tenderness to palpation in the epigastric area without any rebound or guarding or peritoneal signs. It is possible that she has a symptomatic hiatal hernia, or that she is having GERD and needs to be restarted on a PPI. I will plan to discharge her home with this medication and have her follow-up with her PMD, Dr. Darrick Huntsman, tomorrow for repeat abdominal examination.  Plan discharge.  Rockne Menghini, MD 05/15/15 [phone]

## 2015-05-15 NOTE — ED Provider Notes (Signed)
[facility] Emergency Department Provider Note  ____________________________________________  Time seen: 3: 15 AM  I have reviewed the triage vital signs and the nursing notes.   HISTORY  Chief Complaint Abdominal Pain      HPI [name] is a 80 y.o. female presents with upper abdominal pain accompanied by nausea times one day. Patient states current pain score 7 out of 10. Patient denies any vomiting no diarrhea or constipation.   Past Medical History  Diagnosis Date  . Diabetes mellitus   . GERD (gastroesophageal reflux disease)   . Hypertension   . Anemia   . Arthritis   . Hemorrhoids   . Hypertension   . CHF (congestive heart failure) [facility])     Patient Active Problem List   Diagnosis Date Noted  . CKD (chronic kidney disease) stage 3, GFR 30-59 ml/min 05/03/2015  . Noncompliance with medication treatment due to overuse of medication 01/28/2015  . Hypothyroidism 01/28/2015  . Dysuria 08/07/2014  . Back pain of thoracolumbar region 09/23/2013  . Visit for preventive health examination 08/18/2013  . Obesity 07/07/2013  . Depression with anxiety 06/26/2013  . Hemorrhoids 11/06/2012  . Hidradenitis suppurativa 10/05/2012  . Cognitive changes 01/18/2012  . Venous insufficiency of leg 11/24/2011  . Type 2 diabetes, uncontrolled, with renal manifestation (HCC) 10/03/2011  . Anemia of chronic renal failure, stage 4 (severe) (HCC) 10/03/2011  . Chest pain with normal coronary angiography 04/21/2011  . Screening for colon cancer 04/12/2011  . Screening for breast cancer 04/12/2011  . Hypertension 04/12/2011  . Hyperlipidemia with target LDL less than 100 04/12/2011  . Screening for osteoporosis 04/12/2011    Past Surgical History  Procedure Laterality Date  . Cholecystectomy    . Abdominal hysterectomy    . Back surgery    . Arthroplasty w/ arthroscopy medial / lateral compartment knee  Feb 2011    [name]  . Hemorrhoid surgery   12/19/12    Current Outpatient Rx  Name  Route  Sig  Dispense  Refill  . ACCU-CHEK AVIVA PLUS test strip      TEST BLOOD SUGAR TWO TO THREE TIMES A DAY   100 each   6   . ACCU-CHEK SOFTCLIX LANCETS lancets      CHECK BLOOD SUGAR TWICE A DAY   100 each   6   . ALPRAZolam (XANAX) 0.5 MG tablet      TAKE ONE TABLET TWICE DAILY AS NEEDED FOR ANXIETY   60 tablet   1   . aspirin EC 81 MG tablet   Oral   Take 81 mg by mouth daily.         Marland Kitchen atenolol (TENORMIN) 100 MG tablet   Oral   Take 1 tablet (100 mg total) by mouth daily.   60 tablet   4   . carvedilol (COREG) 3.125 MG tablet   Oral   Take 1 tablet (3.125 mg total) by mouth 2 (two) times daily with a meal. Patient not taking: Reported on 04/30/2015   60 tablet   2   . cloNIDine (CATAPRES) 0.1 MG tablet   Oral   Take 1 tablet (0.1 mg total) by mouth as needed.   60 tablet   1   . esomeprazole (NEXIUM) 40 MG capsule      TAKE ONE (1) CAPSULE EACH DAY BEFORE BREAKFAST   30 capsule   11   . Insulin Isophane & Regular Human (HUMULIN 70/30 KWIKPEN) (70-30) 100 UNIT/ML PEN  Take 10 units Twice a day subcutaneously   15 mL   11   . Insulin Pen Needle (ULTICARE SHORT PEN NEEDLES) 31G X 8 MM MISC      USE WITH INSULIN   100 each   12   . lactulose (CHRONULAC) 10 GM/15ML solution   Oral   Take 15 mLs (10 g total) by mouth once a week. As needed for constipation   240 mL   1   . levothyroxine (SYNTHROID) 25 MCG tablet   Oral   Take 1 tablet (25 mcg total) by mouth daily before breakfast.   90 tablet   1   . rosuvastatin (CRESTOR) 20 MG tablet   Oral   Take 1 tablet (20 mg total) by mouth daily.   30 tablet   2   . traMADol (ULTRAM) 50 MG tablet   Oral   Take 1 tablet (50 mg total) by mouth every 6 (six) hours as needed.   20 tablet   0   . ULORIC 40 MG tablet      TAKE ONE (1) TABLET EACH DAY   30 tablet   5   . valsartan (DIOVAN) 320 MG tablet   Oral   Take 1 tablet (320 mg total)  by mouth daily.   30 tablet   5     Allergies Hydrocodone-acetaminophen and Morphine and related  Family History  Problem Relation Age of Onset  . Heart disease Mother   . Hypertension Mother   . Cancer Mother     Social History Social History  Substance Use Topics  . Smoking status: Former Smoker -- 30 years    Types: Cigarettes    Quit date: 09/28/2001  . Smokeless tobacco: Current User    Types: Snuff  . Alcohol Use: No    Review of Systems  Constitutional: Negative for fever. Eyes: Negative for visual changes. ENT: Negative for sore throat. Cardiovascular: Negative for chest pain. Respiratory: Negative for shortness of breath. Gastrointestinal: Positive for abdominal pain Genitourinary: Negative for dysuria. Musculoskeletal: Negative for back pain. Skin: Negative for rash. Neurological: Negative for headaches, focal weakness or numbness.   10-point ROS otherwise negative.  ____________________________________________   PHYSICAL EXAM:  VITAL SIGNS: ED Triage Vitals  Enc Vitals Group     BP --      Pulse Rate 05/15/15 0307 74     Resp 05/15/15 0307 15     Temp 05/15/15 0307 98 F (36.7 C)     Temp Source 05/15/15 0307 Oral     SpO2 05/15/15 0307 99 %     Weight 05/15/15 0307 191 lb (86.637 kg)     Height 05/15/15 0307  (1.626 m)     Head Cir --      Peak Flow --      Pain Score 05/15/15 0308 7     Pain Loc --      Pain Edu? --      Excl. in GC? --     Constitutional: Alert and oriented. Well appearing and in no distress. Eyes: Conjunctivae are normal. PERRL. Normal extraocular movements. ENT   Head: Normocephalic and atraumatic.   Nose: No congestion/rhinnorhea.   Mouth/Throat: Mucous membranes are moist.   Neck: No stridor. Hematological/Lymphatic/Immunilogical: No cervical lymphadenopathy. Cardiovascular: Normal rate, regular rhythm. Normal and symmetric distal pulses are present in all extremities. No murmurs, rubs, or  gallops. Respiratory: Normal respiratory effort without tachypnea nor retractions. Breath sounds are clear and equal bilaterally.  No wheezes/rales/rhonchi. Gastrointestinal: Right upper quadrant/epigastric/left lower quadrant tenderness to palpation.. No distention. There is no CVA tenderness. Genitourinary: deferred Musculoskeletal: Nontender with normal range of motion in all extremities. No joint effusions.  No lower extremity tenderness nor edema. Neurologic:  Normal speech and language. No gross focal neurologic deficits are appreciated. Speech is normal.  Skin:  Skin is warm, dry and intact. No rash noted. Psychiatric: Mood and affect are normal. Speech and behavior are normal. Patient exhibits appropriate insight and judgment.  ____________________________________________    LABS (pertinent positives/negatives)  Labs Reviewed  COMPREHENSIVE METABOLIC PANEL - Abnormal; Notable for the following:    Glucose, Bld 133 (*)    Creatinine, Ser 1.71 (*)    Albumin 3.4 (*)    Alkaline Phosphatase 146 (*)    Total Bilirubin <0.1 (*)    GFR calc non Af Amer 28 (*)    GFR calc Af Amer 32 (*)    Anion gap 3 (*)    All other components within normal limits  CBC - Abnormal; Notable for the following:    RBC 3.73 (*)    Hemoglobin 11.9 (*)    HCT 34.6 (*)    All other components within normal limits  URINALYSIS COMPLETEWITH MICROSCOPIC ([facility] ONLY) - Abnormal; Notable for the following:    Color, Urine STRAW (*)    APPearance CLEAR (*)    Specific Gravity, Urine 1.003 (*)    Hgb urine dipstick 1+ (*)    Protein, ur 100 (*)    Bacteria, UA RARE (*)    Squamous Epithelial / LPF 0-5 (*)    All other components within normal limits  LIPASE, BLOOD     ____________________________________________   EKG  ED ECG REPORT I, BROWN, [place] N, the attending physician, personally viewed and interpreted this ECG.   Date: 05/16/2015  EKG Time: 3:14 AM  Rate: 64  Rhythm: Normal sinus  rhythm  Axis: None  Intervals: Normal  ST&T Change: None   ____________________________________________    RADIOLOGY  CT Abdomen Pelvis Wo Contrast (Final result) Result time: 05/15/15 07:16:20   Final result by Rad Results In Interface (05/15/15 07:16:20)   Narrative:   CLINICAL DATA: Upper abdominal pain for 24 hours  EXAM: CT ABDOMEN AND PELVIS WITHOUT CONTRAST  TECHNIQUE: Multidetector CT imaging of the abdomen and pelvis was performed following the standard protocol without IV contrast. Oral contrast was administered.  COMPARISON: June 10, 2013  FINDINGS: Lower chest: There is mild bibasilar atelectatic change. There is a small hiatal hernia.  Hepatobiliary: No focal liver lesions are identified. Gallbladder is absent. There is no appreciable biliary duct dilatation.  Pancreas: There is no pancreatic mass or inflammatory focus.  Small hiatal hernia.  Spleen: No splenic lesions are identified.  Adrenals/Urinary Tract: Adrenals appear normal bilaterally. Kidneys bilaterally show no mass or hydronephrosis on either side. There is no renal or ureteral calculus on either side. The urinary bladder is midline with wall thickness within normal limits.  Stomach/Bowel: There are scattered sigmoid diverticula without diverticulitis. There is no bowel wall or mesenteric thickening. No bowel obstruction. No free air or portal venous air. There is lipomatous infiltration of the ileocecal valve.  Vascular/Lymphatic: There is atherosclerotic change with calcification in the aorta and common iliac arteries. No focal vascular lesions are identified on this noncontrast enhanced study. There is no demonstrable adenopathy in the abdomen or pelvis.  Reproductive: Uterus is absent. There is no pelvic mass or pelvic fluid collection.  Other: Appendix not  seen. No periappendiceal region inflammation. No abscess or ascites in the abdomen or pelvis.  Musculoskeletal:  Patient has had fusion cage placement at L4-5. There is degenerative change in the lumbar spine. There are stable tiny lucent areas throughout the iliac crests, stable. No well-defined blastic or lytic bone lesions. No intramuscular or abdominal wall lesion. There is fatty infiltration in the posterior paraspinous muscles bilaterally.  IMPRESSION: No bowel wall thickening or bowel obstruction. No abscess.  Gallbladder absent. Uterus absent. Question absence of the appendix. No periappendiceal region inflammation.  No renal or ureteral calculus. No hydronephrosis.  Lipomatosis infiltration of the ileocecal valve, stable.  Small lucencies in the iliac crests are stable. Significance of this finding is uncertain.  Small hiatal hernia.   Electronically Signed By: Bretta Bang III M.D. On: 05/15/2015 07:16           INITIAL IMPRESSION / ASSESSMENT AND PLAN / ED COURSE  Pertinent labs & imaging results that were available during my care of the patient were reviewed by me and considered in my medical decision making (see chart for details).    ____________________________________________   FINAL CLINICAL IMPRESSION(S) / ED DIAGNOSES  Final diagnoses:  Abdominal pain, epigastric      Darci Current, MD 05/16/15 0501

## 2015-05-15 NOTE — ED Notes (Signed)
Pt to lobby to wait for daughter to pick her up.

## 2015-05-16 ENCOUNTER — Emergency Department
Admission: EM | Admit: 2015-05-16 | Discharge: 2015-05-17 | Disposition: A | Discharge status: Other Acute Inpatient Hospital | Payer: Medicare Other | Attending: Emergency Medicine | Admitting: Emergency Medicine

## 2015-05-16 ENCOUNTER — Encounter: Payer: Self-pay | Admitting: Emergency Medicine

## 2015-05-16 ENCOUNTER — Telehealth: Payer: Self-pay | Admitting: Internal Medicine

## 2015-05-16 DIAGNOSIS — Z79899 Other long term (current) drug therapy: Secondary | ICD-10-CM | POA: Insufficient documentation

## 2015-05-16 DIAGNOSIS — N184 Chronic kidney disease, stage 4 (severe): Secondary | ICD-10-CM | POA: Insufficient documentation

## 2015-05-16 DIAGNOSIS — F419 Anxiety disorder, unspecified: Secondary | ICD-10-CM | POA: Insufficient documentation

## 2015-05-16 DIAGNOSIS — F1310 Sedative, hypnotic or anxiolytic abuse, uncomplicated: Secondary | ICD-10-CM | POA: Insufficient documentation

## 2015-05-16 DIAGNOSIS — I129 Hypertensive chronic kidney disease with stage 1 through stage 4 chronic kidney disease, or unspecified chronic kidney disease: Secondary | ICD-10-CM | POA: Insufficient documentation

## 2015-05-16 DIAGNOSIS — Z87891 Personal history of nicotine dependence: Secondary | ICD-10-CM | POA: Insufficient documentation

## 2015-05-16 DIAGNOSIS — IMO0002 Reserved for concepts with insufficient information to code with codable children: Secondary | ICD-10-CM | POA: Diagnosis present

## 2015-05-16 DIAGNOSIS — Z7982 Long term (current) use of aspirin: Secondary | ICD-10-CM | POA: Insufficient documentation

## 2015-05-16 DIAGNOSIS — F22 Delusional disorders: Secondary | ICD-10-CM | POA: Diagnosis present

## 2015-05-16 DIAGNOSIS — F29 Unspecified psychosis not due to a substance or known physiological condition: Secondary | ICD-10-CM

## 2015-05-16 DIAGNOSIS — I10 Essential (primary) hypertension: Secondary | ICD-10-CM | POA: Diagnosis present

## 2015-05-16 DIAGNOSIS — Z794 Long term (current) use of insulin: Secondary | ICD-10-CM | POA: Insufficient documentation

## 2015-05-16 DIAGNOSIS — E1122 Type 2 diabetes mellitus with diabetic chronic kidney disease: Secondary | ICD-10-CM | POA: Diagnosis not present

## 2015-05-16 DIAGNOSIS — E1129 Type 2 diabetes mellitus with other diabetic kidney complication: Secondary | ICD-10-CM | POA: Diagnosis present

## 2015-05-16 DIAGNOSIS — E1165 Type 2 diabetes mellitus with hyperglycemia: Secondary | ICD-10-CM

## 2015-05-16 HISTORY — DX: Disorder of kidney and ureter, unspecified: N28.9

## 2015-05-16 LAB — URINALYSIS COMPLETE WITH MICROSCOPIC (ARMC ONLY)
BILIRUBIN URINE: NEGATIVE
Bacteria, UA: NONE SEEN
GLUCOSE, UA: NEGATIVE mg/dL
KETONES UR: NEGATIVE mg/dL
Leukocytes, UA: NEGATIVE
Nitrite: NEGATIVE
Protein, ur: 100 mg/dL — AB
SPECIFIC GRAVITY, URINE: 1.004 — AB (ref 1.005–1.030)
SQUAMOUS EPITHELIAL / LPF: NONE SEEN
pH: 6 (ref 5.0–8.0)

## 2015-05-16 LAB — URINE DRUG SCREEN, QUALITATIVE (ARMC ONLY)
AMPHETAMINES, UR SCREEN: NOT DETECTED
Barbiturates, Ur Screen: NOT DETECTED
Benzodiazepine, Ur Scrn: POSITIVE — AB
CANNABINOID 50 NG, UR ~~LOC~~: NOT DETECTED
COCAINE METABOLITE, UR ~~LOC~~: NOT DETECTED
MDMA (ECSTASY) UR SCREEN: NOT DETECTED
Methadone Scn, Ur: NOT DETECTED
OPIATE, UR SCREEN: NOT DETECTED
PHENCYCLIDINE (PCP) UR S: NOT DETECTED
Tricyclic, Ur Screen: NOT DETECTED

## 2015-05-16 LAB — COMPREHENSIVE METABOLIC PANEL
ALK PHOS: 134 U/L — AB (ref 38–126)
ALT: 25 U/L (ref 14–54)
AST: 21 U/L (ref 15–41)
Albumin: 3.4 g/dL — ABNORMAL LOW (ref 3.5–5.0)
Anion gap: 8 (ref 5–15)
BUN: 21 mg/dL — AB (ref 6–20)
CALCIUM: 9.4 mg/dL (ref 8.9–10.3)
CO2: 26 mmol/L (ref 22–32)
CREATININE: 1.74 mg/dL — AB (ref 0.44–1.00)
Chloride: 103 mmol/L (ref 101–111)
GFR, EST AFRICAN AMERICAN: 31 mL/min — AB (ref >60)
GFR, EST NON AFRICAN AMERICAN: 27 mL/min — AB (ref >60)
Glucose, Bld: 230 mg/dL — ABNORMAL HIGH (ref 65–99)
Potassium: 3.9 mmol/L (ref 3.5–5.1)
SODIUM: 137 mmol/L (ref 135–145)
Total Bilirubin: 0.5 mg/dL (ref 0.3–1.2)
Total Protein: 7.2 g/dL (ref 6.5–8.1)

## 2015-05-16 LAB — CBC WITH DIFFERENTIAL/PLATELET
Basophils Absolute: 0 10*3/uL (ref 0–0.1)
Basophils Relative: 0 %
EOS ABS: 0.1 10*3/uL (ref 0–0.7)
EOS PCT: 1 %
HCT: 33.9 % — ABNORMAL LOW (ref 35.0–47.0)
Hemoglobin: 11.8 g/dL — ABNORMAL LOW (ref 12.0–16.0)
LYMPHS ABS: 1.7 10*3/uL (ref 1.0–3.6)
Lymphocytes Relative: 34 %
MCH: 31.9 pg (ref 26.0–34.0)
MCHC: 34.8 g/dL (ref 32.0–36.0)
MCV: 91.7 fL (ref 80.0–100.0)
MONO ABS: 0.4 10*3/uL (ref 0.2–0.9)
Monocytes Relative: 9 %
Neutro Abs: 2.7 10*3/uL (ref 1.4–6.5)
Neutrophils Relative %: 56 %
PLATELETS: 216 10*3/uL (ref 150–440)
RBC: 3.69 MIL/uL — AB (ref 3.80–5.20)
RDW: 12.6 % (ref 11.5–14.5)
WBC: 4.8 10*3/uL (ref 3.6–11.0)

## 2015-05-16 LAB — GLUCOSE, CAPILLARY
GLUCOSE-CAPILLARY: 180 mg/dL — AB (ref 65–99)
Glucose-Capillary: 185 mg/dL — ABNORMAL HIGH (ref 65–99)

## 2015-05-16 LAB — ETHANOL: Alcohol, Ethyl (B): <5 mg/dL (ref <5)

## 2015-05-16 MED ORDER — PANTOPRAZOLE SODIUM 40 MG PO TBEC
40.0000 mg | DELAYED_RELEASE_TABLET | Freq: Every day | ORAL | Status: DC
  Administered 2015-05-16 – 2015-05-17 (×2): 40 mg via ORAL
  Filled 2015-05-16: qty 1

## 2015-05-16 MED ORDER — ROSUVASTATIN CALCIUM 20 MG PO TABS
20.0000 mg | ORAL_TABLET | Freq: Every day | ORAL | Status: DC
  Administered 2015-05-16 – 2015-05-17 (×2): 20 mg via ORAL
  Filled 2015-05-16 (×2): qty 1

## 2015-05-16 MED ORDER — ATENOLOL 25 MG PO TABS
100.0000 mg | ORAL_TABLET | Freq: Two times a day (BID) | ORAL | Status: DC
  Administered 2015-05-16 – 2015-05-17 (×3): 100 mg via ORAL
  Filled 2015-05-16 (×3): qty 4

## 2015-05-16 MED ORDER — ASPIRIN EC 81 MG PO TBEC
81.0000 mg | DELAYED_RELEASE_TABLET | Freq: Every day | ORAL | Status: DC
  Administered 2015-05-16 – 2015-05-17 (×2): 81 mg via ORAL
  Filled 2015-05-16 (×2): qty 1

## 2015-05-16 MED ORDER — LEVOTHYROXINE SODIUM 25 MCG PO TABS
25.0000 ug | ORAL_TABLET | Freq: Every day | ORAL | Status: DC
  Administered 2015-05-17: 25 ug via ORAL
  Filled 2015-05-16 (×2): qty 1

## 2015-05-16 MED ORDER — RISPERIDONE 0.5 MG PO TABS
0.2500 mg | ORAL_TABLET | Freq: Four times a day (QID) | ORAL | Status: DC | PRN
  Filled 2015-05-16: qty 1

## 2015-05-16 MED ORDER — ALPRAZOLAM 0.5 MG PO TABS
0.5000 mg | ORAL_TABLET | Freq: Every day | ORAL | Status: DC
  Administered 2015-05-16: 0.5 mg via ORAL
  Filled 2015-05-16: qty 1

## 2015-05-16 MED ORDER — FEBUXOSTAT 40 MG PO TABS
40.0000 mg | ORAL_TABLET | Freq: Every day | ORAL | Status: DC
  Administered 2015-05-16: 40 mg via ORAL
  Filled 2015-05-16 (×4): qty 1

## 2015-05-16 MED ORDER — PANTOPRAZOLE SODIUM 40 MG PO TBEC
DELAYED_RELEASE_TABLET | ORAL | Status: AC
  Administered 2015-05-16: 40 mg via ORAL
  Filled 2015-05-16: qty 1

## 2015-05-16 MED ORDER — IRBESARTAN 75 MG PO TABS
75.0000 mg | ORAL_TABLET | Freq: Every day | ORAL | Status: DC
  Administered 2015-05-16: 75 mg via ORAL
  Filled 2015-05-16 (×3): qty 1

## 2015-05-16 MED ORDER — CARVEDILOL 3.125 MG PO TABS
3.1250 mg | ORAL_TABLET | Freq: Two times a day (BID) | ORAL | Status: DC
  Administered 2015-05-16: 3.125 mg via ORAL
  Filled 2015-05-16 (×5): qty 1

## 2015-05-16 MED ORDER — TRAMADOL HCL 50 MG PO TABS: 50.0000 mg | ORAL_TABLET | Freq: Four times a day (QID) | ORAL | Status: DC | PRN

## 2015-05-16 NOTE — ED Notes (Signed)
TTS in with pt. 

## 2015-05-16 NOTE — ED Notes (Signed)
Informed by Claris Che that pt was accepted to [facility], informed that [facility] transport was transporting another pt to Claris Gower would not be able to get pt to [place] by 11pm tonight, pt is to go tomorrow 1/21

## 2015-05-16 NOTE — Progress Notes (Addendum)
Pt. has been accepted to [facility]. Room 147 Bed-1 Accepting physician is Dr. Gwynneth Munson, Attending Physician Dr. Minerva Ends .  Call report to [phone]. Please do not call report until secure transportation has arrive to transport the pt.  Pt can only be admit between the hours of  6 am-11 pm   ER Staff is aware of it [facility] Dr. Lenard Lance, ER MD & Claris Che Patient's Nurse)      05/16/2015 Cheryl Flash, MS, NCC, LPCA Therapeutic Triage Specialist

## 2015-05-16 NOTE — ED Notes (Signed)
Pt. Noted in room; told staff that, she is supposed to be getting Clonidine; that she takes every night.. No further complaints or concerns voiced. No distress or abnormal behavior noted. Will continue to monitor with security cameras. Q 15 minute rounds continue.

## 2015-05-16 NOTE — ED Notes (Signed)
ENVIRONMENTAL ASSESSMENT Potentially harmful objects out of patient reach: Yes Personal belongings secured: Yes Patient dressed in hospital provided attire only: Yes Plastic bags out of patient reach: Yes Patient care equipment (cords, cables, call bells, lines, and drains) shortened, removed, or accounted for: Yes Equipment and supplies removed from bottom of stretcher: Yes Potentially toxic materials out of patient reach: Yes Sharps container removed or out of patient reach: Yes  Patient assigned to appropriate care area. Patient oriented to unit/care area: Informed that, for their safety, care areas are designed for safety and monitored by security cameras at all times; and visiting hours explained to patient. Patient verbalizes understanding, and verbal contract for safety obtained.  Patient presented to ED with paranoid thinking, auditory and olfactory hallucinations. Mood is anxious but cooperative. She is alert and oriented. Cooperative with all nursing interventions.
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
## 2015-05-16 NOTE — ED Notes (Signed)
Resting quietly in dayroom. Maintained on 15 minute checks and observation by security camera for safety.

## 2015-05-16 NOTE — Telephone Encounter (Signed)
Called patient DPR ( daughter) to make aware that MD cannot wright orders for patient that are admitted to the hospital advised daughter to talk with the hospitalist. Daughter voiced understanding with " I will ask the Hospitalist about my concerns and whishes to have my mother transferred."

## 2015-05-16 NOTE — ED Notes (Signed)
Resting quietly in dayroom. Patient's daughter called and was updated on her mother's condition.  All safety precautions maintained.

## 2015-05-16 NOTE — BH Assessment (Signed)
Assessment Note  [name] is an 80 y.o. female presenting to the ED voluntarily because she believes her neighbor in the apartment building is breaking into her place and taking medications. She also believes that this neighbor is planting items in her apartment such as hair curlers and rugs.  Pt also believes that someone is spraying chemicals through the vents in her apartment.  She states she is afraid to return to apartment because someone is spying on her from the attic.  Diagnosis: Paranoid  Past Medical History:  Past Medical History  Diagnosis Date  . Diabetes mellitus   . GERD (gastroesophageal reflux disease)   . Hypertension   . Anemia   . Arthritis   . Hemorrhoids   . Hypertension   . CHF (congestive heart failure) (HCC)   . Renal disorder     Past Surgical History  Procedure Laterality Date  . Cholecystectomy    . Abdominal hysterectomy    . Back surgery    . Arthroplasty w/ arthroscopy medial / lateral compartment knee  Feb 2011    [name]  . Hemorrhoid surgery  12/19/12    Family History:  Family History  Problem Relation Age of Onset  . Heart disease Mother   . Hypertension Mother   . Cancer Mother     Social History:  reports that she quit smoking about 13 years ago. Her smoking use included Cigarettes. She quit after 30 years of use. Her smokeless tobacco use includes Snuff. She reports that she does not drink alcohol or use illicit drugs.  Additional Social History:  Alcohol / Drug Use History of alcohol / drug use?: No history of alcohol / drug abuse  CIWA: CIWA-Ar BP: (!) 151/46 mmHg Pulse Rate: 64 COWS:    Allergies:  Allergies  Allergen Reactions  . Hydrocodone-Acetaminophen Nausea Only    Other reaction(s): Dizziness  . Morphine And Related Other (See Comments)    Makes patient feel crazy    Home Medications:  (Not in a hospital admission)  OB/GYN Status:  No LMP recorded. Patient has had a hysterectomy.  General Assessment  Data Location of Assessment: [facility] ED TTS Assessment: In system Is this a Tele or Face-to-Face Assessment?: Face-to-Face Is this an Initial Assessment or a Re-assessment for this encounter?: Initial Assessment Marital status: Single Maiden name: N/A Is patient pregnant?: No Pregnancy Status: No Living Arrangements: Alone Can pt return to current living arrangement?: Yes (Pt does not want to return to her apartment.) Admission Status: Voluntary Is patient capable of signing voluntary admission?: Yes Referral Source: Self/Family/Friend Insurance type: [facility] Medicare  Medical Screening Exam [facility] Walk-in ONLY) Medical Exam completed: Yes  Crisis Care Plan Living Arrangements: Alone Legal Guardian: Other: (self) Name of Psychiatrist: None Name of Therapist: None  Education Status Is patient currently in school?: No Current Grade: N/A Highest grade of school patient has completed: N/A Name of school: N/A Contact person: N/A  Risk to self with the past 6 months Suicidal Ideation: No Has patient been a risk to self within the past 6 months prior to admission? : No Suicidal Intent: No Has patient had any suicidal intent within the past 6 months prior to admission? : No Is patient at risk for suicide?: No Suicidal Plan?: No Has patient had any suicidal plan within the past 6 months prior to admission? : No Access to Means: No What has been your use of drugs/alcohol within the last 12 months?: None Previous Attempts/Gestures: No How many times?: 0  Other Self Harm Risks: None Triggers for Past Attempts: None known Intentional Self Injurious Behavior: None Family Suicide History: No Recent stressful life event(s): Other (Comment) (Pt thinks someone is breaking into her apartment) Persecutory voices/beliefs?: No Depression: No Substance abuse history and/or treatment for substance abuse?: No Suicide prevention information given to non-admitted patients: Not applicable  Risk to  Others within the past 6 months Homicidal Ideation: No Does patient have any lifetime risk of violence toward others beyond the six months prior to admission? : No Thoughts of Harm to Others: No-Not Currently Present/Within Last 6 Months Current Homicidal Intent: No Current Homicidal Plan: No Access to Homicidal Means: No Identified Victim: None identified History of harm to others?: No Assessment of Violence: None Noted Violent Behavior Description: N/A Does patient have access to weapons?: No Criminal Charges Pending?: No Does patient have a court date: No Is patient on probation?: No  Psychosis Hallucinations: None noted Delusions: Unspecified  Mental Status Report Appearance/Hygiene: In scrubs Eye Contact: Fair Motor Activity: Freedom of movement Speech: Soft, Slow Level of Consciousness: Drowsy Mood: Suspicious, Fearful Affect: Frightened, Fearful Anxiety Level: Moderate Thought Processes: Circumstantial Judgement: Partial Orientation: Person, Place Obsessive Compulsive Thoughts/Behaviors: Minimal  Cognitive Functioning Concentration: Normal Memory: Recent Intact IQ: Average Insight: Fair Impulse Control: Fair Appetite: Fair Weight Loss: 0 Weight Gain: 0 Sleep: Decreased Total Hours of Sleep: 5 Vegetative Symptoms: None  ADLScreening [facility] Assessment Services) Patient's cognitive ability adequate to safely complete daily activities?: Yes Patient able to express need for assistance with ADLs?: Yes Independently performs ADLs?: Yes (appropriate for developmental age)  Prior Inpatient Therapy Prior Inpatient Therapy: No Prior Therapy Dates: N/A Prior Therapy Facilty/Provider(s): N/A Reason for Treatment: N/A  Prior Outpatient Therapy Prior Outpatient Therapy: No Prior Therapy Dates: N/A Prior Therapy Facilty/Provider(s): N/A Reason for Treatment: N/A Does patient have an ACCT team?: No Does patient have Intensive In-House Services?  : No Does patient  have [facility] services? : No Does patient have P4CC services?: No  ADL Screening (condition at time of admission) Patient's cognitive ability adequate to safely complete daily activities?: Yes Patient able to express need for assistance with ADLs?: Yes Independently performs ADLs?: Yes (appropriate for developmental age)       Abuse/Neglect Assessment (Assessment to be complete while patient is alone) Physical Abuse: Denies Verbal Abuse: Denies Sexual Abuse: Denies Exploitation of patient/patient's resources: Denies Self-Neglect: Denies Values / Beliefs Cultural Requests During Hospitalization: None Spiritual Requests During Hospitalization: None Consults Spiritual Care Consult Needed: No Social Work Consult Needed: Yes (Comment) (Pt fears returning to her apartment b/c she thinks someone is out to get her. Family may need assistance with placement in assisted living.) Advance Directives (For Healthcare) Does patient have an advance directive?: No    Additional Information 1:1 In Past 12 Months?: No CIRT Risk: No Elopement Risk: No Does patient have medical clearance?: No     Disposition:  Disposition Initial Assessment Completed for this Encounter: Yes Disposition of Patient: Other dispositions Other disposition(s): Other (Comment) (Social work consult to discuss placement.)  On Site Evaluation by:   Reviewed with Physician:    [place] 05/16/2015 5:16 AM

## 2015-05-16 NOTE — ED Notes (Signed)
Report received from Amy H., RN; . Pt. Alert and oriented in no distress denies SI, HI, AVH and pain.  Pt. Instructed to come to me with problems or concerns.Will continue to monitor for safety via security cameras and Q 15 minute checks. 

## 2015-05-16 NOTE — ED Notes (Signed)
Snack and beverage given. 

## 2015-05-16 NOTE — ED Notes (Signed)
Pt. Believes neighbor in apartment building is breaking into her place and taking medications.  Pt. Also believes same person is planting items in her apartment(rug, sheets and hair curlers.) Pt. Is fearful on returning to apartment, but also hates the idea of having to move to somewhere else. Pt. Also fearful someone is spying on her from her attic and releasing some kind of chemical into her apartment.

## 2015-05-16 NOTE — Progress Notes (Signed)
Referral information for Geriatric Placements have been faxed to;       [facility]([phone]),    [facility]([phone]),    [facility]([phone]),    [facility]([phone]),    [facility]([phone]).   05/16/2015 Nicole Evonne Rinks, MS, NCC, LPCA Therapeutic Triage Specialist  

## 2015-05-16 NOTE — ED Notes (Signed)
Pt. Noted in the day room watching the tv. No complaints or concerns voiced. No distress or abnormal behavior noted. Will continue to monitor with security cameras. Q 15 minute rounds continue. 

## 2015-05-16 NOTE — ED Notes (Signed)
Pt. Noted in the day room. No complaints or concerns voiced. No distress or abnormal behavior noted. Will continue to monitor with security cameras. Q 15 minute rounds continue. 

## 2015-05-16 NOTE — ED Notes (Signed)
Pt belongings returned to daughter and family, Corretta and Allyson Sabal, per pt's permission.  1/1 bag returned.

## 2015-05-16 NOTE — ED Notes (Signed)
Meal served. Patient in dayroom, watching television. In no acute distress. Maintained on 15 minute checks and observation by security camera for safety.

## 2015-05-16 NOTE — ED Notes (Addendum)
A telephone call was placed to pt.'s daughter--[name]--[phone]; to make her aware of pt. Having been accepted at [facility] by Dr. Gwynneth Munson and Dr. Minerva Ends; who will be attending--[phone]; Per Cheryl Flash, MS, NCC, LPCA, TTS.

## 2015-05-16 NOTE — Consult Note (Signed)
[facility] Consult   Reason for Consult:  80 year old woman with minimal past psychiatric history is brought to the hospital because of concerns about worsening psychotic symptoms and paranoia Referring Physician:  Owens Shark Patient Identification: [name] MRN:  [id number] Principal Diagnosis: Psychosis Diagnosis:   Patient Active Problem List   Diagnosis Date Noted  . Psychosis [F29] 05/16/2015  . CKD (chronic kidney disease) stage 3, GFR 30-59 ml/min [N18.3] 05/03/2015  . Noncompliance with medication treatment due to overuse of medication [Z91.14] 01/28/2015  . Hypothyroidism [E03.9] 01/28/2015  . Dysuria [R30.0] 08/07/2014  . Back pain of thoracolumbar region [M54.5] 09/23/2013  . Visit for preventive health examination [Z00.00] 08/18/2013  . Obesity [E66.9] 07/07/2013  . Depression with anxiety [F41.8] 06/26/2013  . Hemorrhoids [K64.9] 11/06/2012  . Hidradenitis suppurativa [L73.2] 10/05/2012  . Venous insufficiency of leg [I87.2] 11/24/2011  . Type 2 diabetes, uncontrolled, with renal manifestation ([place]) [E11.29, E11.65] 10/03/2011  . Anemia of chronic renal failure, stage 4 (severe) (HCC) [N18.4, D63.1] 10/03/2011  . Chest pain with normal coronary angiography [R07.9] 04/21/2011  . Screening for colon cancer [Z12.11] 04/12/2011  . Screening for breast cancer [Z12.39] 04/12/2011  . Hypertension [I10] 04/12/2011  . Hyperlipidemia with target LDL less than 100 [E78.5] 04/12/2011  . Screening for osteoporosis [Z13.820] 04/12/2011    Total Time spent with patient: 1 hour  Subjective:   [name] is a 80 y.o. female patient admitted with "I know you might not believe me".  HPI:  A shouldn't interviewed. Chart reviewed. Old notes and chart reviewed. Labs reviewed and vitals reviewed. This 80 year old woman came to the hospital after calling 911 once again in something of a panic over her fear and delusions at home. Patient takes a while to get the whole  story out but it sounds like 4 weeks and maybe longer than that she's been believing that another woman who lives in her apartment complex has been stealing her medicine. In fact she seems to think that the woman is breaking into her house every single day and now steals not only medicine but other things in the house. Also believes that people are rearranging things in her house. Also has developed a belief that people are creeping around in her attic and blowing fumes into her house and blowing other chemicals in her front door which harm her in some way. Her mood has been very anxious and upset. She is sleeping very poorly and is afraid to go to sleep. Has called police multiple occasions because of this. He is not amenable to being told that she has nothing to worry about. Patient is not reporting any suicidal thoughts has not assaulted anyone. She is currently on multiple medications but the only one evidently for psychiatric use is a low dose of Xanax at night. Patient has been living in her current apartment complex for about 7 years. She doesn't report any clear verifiable stress that could've set this off. Health sounds like it's been stable but with multiple medical problems for a while. She has not been seen apparently by a provider for these complaints yet.  Social history: Patient lives in an apartment complex design for older residence. She's been there for about 7 years. Says that she's never liked it but it sounds like she actually was doing fine there for a long period of time. He is accusing of stealing her medicine sounds like they actually are a friend of hers. Patient has family including a daughter and siblings.  She has told him about her complaints and they have told her that they don't believe them.  Medical history: Multiple medical problems including high blood pressure, history of heart failure, hypothyroidism, history of elevated triglycerides and cholesterol, diabetes. Chronic  pain.  Substance abuse history: Patient denies any alcohol or drug use. Indicates that many many years ago she used to drink but that that's been probably a couple decades ago. Nothing recent.  Past Psychiatric History: Patient has no clear past psychiatric history other than being prescribed Xanax for sleep by her outpatient provider. Patient denies any history of suicide attempts denies any history of violence. Sounds like many years ago during the dissolution of her marriage she might of seen a counselor a couple times but nothing much came of it. No history of hospitalizations.  Risk to Self: Suicidal Ideation: No Suicidal Intent: No Is patient at risk for suicide?: No Suicidal Plan?: No Access to Means: No What has been your use of drugs/alcohol within the last 12 months?: None How many times?: 0 Other Self Harm Risks: None Triggers for Past Attempts: None known Intentional Self Injurious Behavior: None Risk to Others: Homicidal Ideation: No Thoughts of Harm to Others: No-Not Currently Present/Within Last 6 Months Current Homicidal Intent: No Current Homicidal Plan: No Access to Homicidal Means: No Identified Victim: None identified History of harm to others?: No Assessment of Violence: None Noted Violent Behavior Description: N/A Does patient have access to weapons?: No Criminal Charges Pending?: No Does patient have a court date: No Prior Inpatient Therapy: Prior Inpatient Therapy: No Prior Therapy Dates: N/A Prior Therapy Facilty/Provider(s): N/A Reason for Treatment: N/A Prior Outpatient Therapy: Prior Outpatient Therapy: No Prior Therapy Dates: N/A Prior Therapy Facilty/Provider(s): N/A Reason for Treatment: N/A Does patient have an ACCT team?: No Does patient have Intensive In-House Services?  : No Does patient have [facility] services? : No Does patient have P4CC services?: No  Past Medical History:  Past Medical History  Diagnosis Date  . Diabetes mellitus   .  GERD (gastroesophageal reflux disease)   . Hypertension   . Anemia   . Arthritis   . Hemorrhoids   . Hypertension   . CHF (congestive heart failure) (HCC)   . Renal disorder     Past Surgical History  Procedure Laterality Date  . Cholecystectomy    . Abdominal hysterectomy    . Back surgery    . Arthroplasty w/ arthroscopy medial / lateral compartment knee  Feb 2011    [name]  . Hemorrhoid surgery  12/19/12   Family History:  Family History  Problem Relation Age of Onset  . Heart disease Mother   . Hypertension Mother   . Cancer Mother    Family Psychiatric  History: Patient does not know of any kind of family history of mental health problems Social History:  History  Alcohol Use No     History  Drug Use No    Social History   Social History  . Marital Status: Single    Spouse Name: N/A  . Number of Children: N/A  . Years of Education: N/A   Social History Main Topics  . Smoking status: Former Smoker -- 30 years    Types: Cigarettes    Quit date: 09/28/2001  . Smokeless tobacco: Current User    Types: Snuff  . Alcohol Use: No  . Drug Use: No  . Sexual Activity: Not Currently   Other Topics Concern  . None   Social History Narrative  Additional Social History:    History of alcohol / drug use?: No history of alcohol / drug abuse                     Allergies:   Allergies  Allergen Reactions  . Hydrocodone-Acetaminophen Nausea Only    Other reaction(s): Dizziness  . Morphine And Related Other (See Comments)    Makes patient feel crazy    Labs:  Results for orders placed or performed during the hospital encounter of 05/16/15 (from the past 48 hour(s))  Comprehensive metabolic panel     Status: Abnormal   Collection Time: 05/16/15  3:39 AM  Result Value Ref Range   Sodium 137 135 - 145 mmol/L   Potassium 3.9 3.5 - 5.1 mmol/L   Chloride 103 101 - 111 mmol/L   CO2 26 22 - 32 mmol/L   Glucose, Bld 230 (H) 65 - 99 mg/dL   BUN 21 (H) 6  - 20 mg/dL   Creatinine, Ser 1.74 (H) 0.44 - 1.00 mg/dL   Calcium 9.4 8.9 - 10.3 mg/dL   Total Protein 7.2 6.5 - 8.1 g/dL   Albumin 3.4 (L) 3.5 - 5.0 g/dL   AST 21 15 - 41 U/L   ALT 25 14 - 54 U/L   Alkaline Phosphatase 134 (H) 38 - 126 U/L   Total Bilirubin 0.5 0.3 - 1.2 mg/dL   GFR calc non Af Amer 27 (L) >60 mL/min   GFR calc Af Amer 31 (L) >60 mL/min    Comment: (NOTE) The eGFR has been calculated using the CKD EPI equation. This calculation has not been validated in all clinical situations. eGFR's persistently <60 mL/min signify possible Chronic Kidney Disease.    Anion gap 8 5 - 15  Ethanol     Status: None   Collection Time: 05/16/15  3:39 AM  Result Value Ref Range   Alcohol, Ethyl (B) <5 <5 mg/dL    Comment:        LOWEST DETECTABLE LIMIT FOR SERUM ALCOHOL IS 5 mg/dL FOR MEDICAL PURPOSES ONLY   CBC with Diff     Status: Abnormal   Collection Time: 05/16/15  3:39 AM  Result Value Ref Range   WBC 4.8 3.6 - 11.0 K/uL   RBC 3.69 (L) 3.80 - 5.20 MIL/uL   Hemoglobin 11.8 (L) 12.0 - 16.0 g/dL   HCT 33.9 (L) 35.0 - 47.0 %   MCV 91.7 80.0 - 100.0 fL   MCH 31.9 26.0 - 34.0 pg   MCHC 34.8 32.0 - 36.0 g/dL   RDW 12.6 11.5 - 14.5 %   Platelets 216 150 - 440 K/uL   Neutrophils Relative % 56 %   Neutro Abs 2.7 1.4 - 6.5 K/uL   Lymphocytes Relative 34 %   Lymphs Abs 1.7 1.0 - 3.6 K/uL   Monocytes Relative 9 %   Monocytes Absolute 0.4 0.2 - 0.9 K/uL   Eosinophils Relative 1 %   Eosinophils Absolute 0.1 0 - 0.7 K/uL   Basophils Relative 0 %   Basophils Absolute 0.0 0 - 0.1 K/uL    No current facility-administered medications for this encounter.   Current Outpatient Prescriptions  Medication Sig Dispense Refill  . ALPRAZolam (XANAX) 0.5 MG tablet TAKE ONE TABLET TWICE DAILY AS NEEDED FOR ANXIETY (Patient taking differently: Take 0.5 mg by mouth 2 (two) times daily as needed for anxiety. ) 60 tablet 1  . aspirin EC 81 MG tablet Take 81 mg by mouth  daily.    . atenolol  (TENORMIN) 100 MG tablet Take 1 tablet (100 mg total) by mouth daily. (Patient taking differently: Take 100 mg by mouth 2 (two) times daily. ) 60 tablet 4  . carvedilol (COREG) 3.125 MG tablet Take 1 tablet (3.125 mg total) by mouth 2 (two) times daily with a meal. 60 tablet 2  . cloNIDine (CATAPRES) 0.1 MG tablet Take 1 tablet (0.1 mg total) by mouth as needed. (Patient taking differently: Take 0.1 mg by mouth daily as needed (high blood pressure). ) 60 tablet 1  . levothyroxine (SYNTHROID) 25 MCG tablet Take 1 tablet (25 mcg total) by mouth daily before breakfast. (Patient taking differently: Take 12.5 mcg by mouth daily before breakfast. ) 90 tablet 1  . omeprazole (PRILOSEC) 40 MG capsule Take 1 capsule (40 mg total) by mouth daily. 30 capsule 0  . rosuvastatin (CRESTOR) 20 MG tablet Take 1 tablet (20 mg total) by mouth daily. 30 tablet 2  . ULORIC 40 MG tablet TAKE ONE (1) TABLET EACH DAY 30 tablet 5  . valsartan (DIOVAN) 320 MG tablet Take 1 tablet (320 mg total) by mouth daily. 30 tablet 5  . Insulin Isophane & Regular Human (HUMULIN 70/30 KWIKPEN) (70-30) 100 UNIT/ML PEN Take 10 units Twice a day subcutaneously (Patient not taking: Reported on 05/16/2015) 15 mL 11  . lactulose (CHRONULAC) 10 GM/15ML solution Take 15 mLs (10 g total) by mouth once a week. As needed for constipation (Patient not taking: Reported on 05/15/2015) 240 mL 1  . traMADol (ULTRAM) 50 MG tablet Take 1 tablet (50 mg total) by mouth every 6 (six) hours as needed. (Patient not taking: Reported on 05/15/2015) 20 tablet 0    Musculoskeletal: Strength & Muscle Tone: decreased Gait & Station: normal Patient leans: N/A  Psychiatric Specialty Exam: Review of Systems  Constitutional: Negative.   HENT: Negative.   Eyes: Negative.   Respiratory: Negative.   Cardiovascular: Negative.   Gastrointestinal: Negative.   Musculoskeletal: Negative.   Skin: Negative.   Neurological: Negative.   Psychiatric/Behavioral: Positive for  depression, hallucinations and memory loss. Negative for suicidal ideas and substance abuse. The patient is nervous/anxious. The patient does not have insomnia.     Blood pressure 152/47, pulse 62, temperature 97.6 F (36.4 C), temperature source Oral, resp. rate 18, height 5' (1.524 m), weight 86.637 kg (191 lb), SpO2 100 %.Body mass index is 37.3 kg/(m^2).  General Appearance: Disheveled  Eye Sport and exercise psychologist::  Fair  Speech:  Slow  Volume:  Normal  Mood:  Dysphoric  Affect:  Tearful  Thought Process:  Loose  Orientation:  Other:  Patient was basically oriented to place and time although it took her several tries to remember the correct year  Thought Content:  Delusions, Hallucinations: Auditory and Ideas of Reference:   Paranoia  Suicidal Thoughts:  No  Homicidal Thoughts:  No  Memory:  Immediate;   Fair Recent;   Poor Remote;   Fair  Judgement:  Impaired  Insight:  Lacking  Psychomotor Activity:  Psychomotor Retardation  Concentration:  Fair  Recall:  [place]  Language: Fair  Akathisia:  No  Handed:  Right  AIMS (if indicated):     Assets:  Desire for Improvement Financial Resources/Insurance Housing Resilience Social Support  ADL's:  Intact  Cognition: Impaired,  Mild  Sleep:      Treatment Plan Summary: Daily contact with patient to assess and evaluate symptoms and progress in treatment, Medication management and Plan  80 year old woman without a year past psychiatric history but with a history of multiple medical problems is presenting with psychotic symptoms. These are fairly typical symptoms in elderly patients especially living alone including a belief that people are stealing from her breaking into her house moving her things around pumping fumes into her house. Patient is not open to the possibility that these are delusions and has become increasingly agitated by them. Unable to sleep at night. Calling the police multiple times. Review of her labs does not  show an obvious acute illness or sign of infection that would be likely to be the cause of this. No recent changes to medication that would be an obvious cause of this. Could be related to a progressive dementing condition both possibly Alzheimer's or vascular related dementia. At this point patient appears to be a good candidate for geriatric psychiatry ward. I have informed her that that will be our treatment plan and obviously she is distressed because of her lack of insight. We will try and keep her on her normal medicine and make sure she is being medically cared for here. Case discussed with TTS and emergency room physician. Referral being conducted to geriatric psychiatry.  Disposition: Recommend psychiatric Inpatient admission when medically cleared. Supportive therapy provided about ongoing stressors.  Lasharon Dunivan 05/16/2015 2:13 PM

## 2015-05-17 DIAGNOSIS — F22 Delusional disorders: Secondary | ICD-10-CM | POA: Diagnosis not present

## 2015-05-17 DIAGNOSIS — F250 Schizoaffective disorder, bipolar type: Secondary | ICD-10-CM | POA: Insufficient documentation

## 2015-05-17 LAB — GLUCOSE, CAPILLARY: Glucose-Capillary: 145 mg/dL — ABNORMAL HIGH (ref 65–99)

## 2015-05-17 NOTE — ED Notes (Signed)
Pt's daughter has all belongings.

## 2015-05-17 NOTE — ED Notes (Signed)
Pt. Noted in room sleeping;. No complaints or concerns voiced. No distress or abnormal behavior noted. Will continue to monitor with security cameras. Q 15 minute rounds continue. 

## 2015-05-17 NOTE — ED Notes (Signed)
Pt. Noted in room resting quietly;. No complaints or concerns voiced. No distress or abnormal behavior noted. Will continue to monitor with security cameras. Q 15 minute rounds continue. 

## 2015-05-17 NOTE — ED Notes (Signed)
Pt's daughter had taken home all of her belongings.

## 2015-05-17 NOTE — ED Notes (Signed)
Daughter called and informed she was on her way to [employer]

## 2015-05-17 NOTE — ED Notes (Signed)
Ppt's blood pressure status has been reported to ER-MD, Dr. Lenard Lance.

## 2015-05-17 NOTE — ED Notes (Signed)
Pt in day room speaking to daughter on the phone. Calm behavior. Maintained on 15 minute checks and observation by security camera for safety.

## 2015-05-17 NOTE — ED Notes (Signed)
Pt informed she will be transferred to [facility]. Pt states she knows nothing about that facility and wants to stay in her home city. Pt is confused, slightly paranoid. Pt denies SI/HI and AVH. Denies pain. Pt cooperative, calm. Maintained on 15 minute checks and observation by security camera for safety.

## 2015-05-17 NOTE — ED Notes (Signed)
Pt. Noted in room. Was awake a few minutes earlier; was given water; and she went back to bed. No complaints or concerns voiced. No distress or abnormal behavior noted. Will continue to monitor with security cameras. Q 15 minute rounds continue.

## 2015-05-17 NOTE — ED Provider Notes (Signed)
Patient accepted in transfer to [facility]  Filed Vitals:   05/17/15 0927 05/17/15 1049  BP: 160/80 160/80  Pulse: 68 68  Temp:  98.1 F (36.7 C)  Resp:  18   Patient alert in no distress at time of discharge.  Sharyn Creamer, MD 05/17/15 [phone]

## 2015-05-17 NOTE — ED Notes (Signed)
Pt washed up prior to transport. Pt was not happy about leaving with the officers, but was cooperative.

## 2015-05-17 NOTE — ED Provider Notes (Signed)
-----------------------------------------   5:49 AM on 05/17/2015 -----------------------------------------   Blood pressure 164/40, pulse 65, temperature 97.5 F (36.4 C), temperature source Oral, resp. rate 19, height 5' (1.524 m), weight 86.637 kg, SpO2 100 %.  The patient had no acute events since last update.  Calm and cooperative at this time.  Reportedly accepted to [facility] and should be transferred within the next 24 hours or less.   Loleta Rose, MD 05/17/15 [phone]

## 2015-06-02 DIAGNOSIS — F0280 Dementia in other diseases classified elsewhere without behavioral disturbance: Secondary | ICD-10-CM | POA: Insufficient documentation

## 2015-06-02 DIAGNOSIS — F0150 Vascular dementia without behavioral disturbance: Secondary | ICD-10-CM | POA: Insufficient documentation

## 2015-06-02 DIAGNOSIS — G309 Alzheimer's disease, unspecified: Secondary | ICD-10-CM

## 2015-06-06 ENCOUNTER — Telehealth: Payer: Self-pay

## 2015-06-06 NOTE — Telephone Encounter (Signed)
Pt's caregiver Carma Leaven stated that Ms.Peeks was sent home on Humalin 70/30 but her insurance will not cover it, Ms. Andrey Campanile wanted to know if she could use the Novalong 20 units instead, and she needs a refill if that's fine to use. Please advise,thanks.

## 2015-06-09 ENCOUNTER — Telehealth: Payer: Self-pay | Admitting: Internal Medicine

## 2015-06-09 NOTE — Telephone Encounter (Signed)
Thank you.  Will follow as appropriate. 

## 2015-06-09 NOTE — Telephone Encounter (Signed)
HFU/ Pt was discharged today. Diagnosis is Demtia. Pt is scheduled for 06/18/2015 :30pm. Thank you!

## 2015-06-09 NOTE — Telephone Encounter (Signed)
I cannot tell her that without seeing her blood sugars.

## 2015-06-10 ENCOUNTER — Telehealth: Payer: Self-pay | Admitting: Internal Medicine

## 2015-06-10 ENCOUNTER — Other Ambulatory Visit: Payer: Self-pay | Admitting: Internal Medicine

## 2015-06-10 MED ORDER — INSULIN ASPART 100 UNIT/ML ~~LOC~~ SOLN: 10.0000 [IU] | Freq: Three times a day (TID) | SUBCUTANEOUS | Status: DC

## 2015-06-10 MED ORDER — INSULIN REGULAR HUMAN 100 UNIT/ML IJ SOLN: 10.0000 [IU] | Freq: Three times a day (TID) | INTRAMUSCULAR | Status: DC

## 2015-06-10 NOTE — Telephone Encounter (Signed)
Yes humalog 10 units tid qac   10 ml bottle

## 2015-06-10 NOTE — Telephone Encounter (Signed)
Last night before she ate her dinner it was 90, while in the hospital she was on novalog she was stable. Pt has appt coming up on the 22nd. Please advise, thanks

## 2015-06-10 NOTE — Telephone Encounter (Signed)
PATIENT SHOULD ONLY BE TAKING HUMULIN R,  10 UNITS  AT [facility] MEAL.  NO OTHER INSULIN UNTIL SHE FOLLOWS UP WITH ME

## 2015-06-10 NOTE — Telephone Encounter (Signed)
Yes, but have her start  with 10 units of Novolog tid qac ,  Not 20,  And cancel the humulin  R that I sent to pharmacy in error.  cyou have to call the pharmacy

## 2015-06-10 NOTE — Telephone Encounter (Signed)
Pharmacy called for change in Novolog to humalog due to patient insurance will not cover Novolog verbal given for humalog ?

## 2015-06-10 NOTE — Telephone Encounter (Signed)
Done as written

## 2015-06-11 ENCOUNTER — Telehealth: Payer: Self-pay | Admitting: Internal Medicine

## 2015-06-11 ENCOUNTER — Telehealth: Payer: Self-pay

## 2015-06-11 NOTE — Telephone Encounter (Signed)
Verbal order to continue home health given

## 2015-06-11 NOTE — Telephone Encounter (Signed)
Transition Care Management Follow-up Telephone Call   Date discharged? 06/09/15   How have you been since you were released from the hospital? Doing ok.  BP running 200/90.  No headache or dizziness. Appetite is good. Voiding ok. Orlene Erm is good since discharge. Minimal hallucinations. Someone is with her during daytime hours and stays alone at night.   Do you understand why you were in the hospital? Yes, daughter is helping her to understand.   Do you understand the discharge instructions? Yes and resting well, taking all medications and following up appropriately with appointments.   Where were you discharged to? Home   Items Reviewed:  Medications reviewed: Yes, taking all medications as scheduled.  Dr. Thedore Mins added Lasix 40 mg today.  Allergies reviewed: Yes, no changes.  Dietary changes reviewed: Yes, no problems.  Referrals reviewed: Yes, Psychiatry and Neurology appointment scheduled.   Functional Questionnaire:   Activities of Daily Living (ADLs):   She states they are independent in the following: Bathing, dressing, grooming, self feeding. States they require assistance with the following: Ambulates with cane, PT to start tomorrow.     Any transportation issues/concerns?: No.    Any patient concerns? High blood pressure readings.   Confirmed importance and date/time of follow-up visits scheduled Yes, appointment scheduled 06/18/15 at 3:30.  Provider Appointment booked with Dr. Darrick Huntsman (PCP).  Confirmed with patient if condition begins to worsen call PCP or go to the ER.  Patient was given the office number and encouraged to call back with question or concerns.  : Yes, verbalized understanding.

## 2015-06-11 NOTE — Telephone Encounter (Signed)
[name] from [place]. [phone]. Pt release from the hospital, behavioral unit. Need orders to continue seeing her at home. Pt's BP is also 228/73, and pulse 78, this was at 10:15 this morning. Pt said she already took her morning BP medication.

## 2015-06-11 NOTE — Telephone Encounter (Signed)
Patient was given clonidine after the reading was attained of 228/73 with pulse of 78, but nurse could not attain a post medication reading due to patient going to see nephrology. Order needed to continue Homehealth.

## 2015-06-12 NOTE — Telephone Encounter (Signed)
Verbal given to continue home health.

## 2015-06-18 ENCOUNTER — Ambulatory Visit (INDEPENDENT_AMBULATORY_CARE_PROVIDER_SITE_OTHER): Payer: Medicare Other | Admitting: Internal Medicine

## 2015-06-18 ENCOUNTER — Encounter: Payer: Self-pay | Admitting: Internal Medicine

## 2015-06-18 VITALS — BP 142/60 | HR 88 | Temp 97.6°F | Resp 14 | Ht 60.0 in | Wt 191.2 lb

## 2015-06-18 DIAGNOSIS — Z794 Long term (current) use of insulin: Secondary | ICD-10-CM

## 2015-06-18 DIAGNOSIS — E038 Other specified hypothyroidism: Secondary | ICD-10-CM | POA: Diagnosis not present

## 2015-06-18 DIAGNOSIS — G309 Alzheimer's disease, unspecified: Secondary | ICD-10-CM

## 2015-06-18 DIAGNOSIS — F0150 Vascular dementia without behavioral disturbance: Secondary | ICD-10-CM

## 2015-06-18 DIAGNOSIS — IMO0002 Reserved for concepts with insufficient information to code with codable children: Secondary | ICD-10-CM

## 2015-06-18 DIAGNOSIS — E1121 Type 2 diabetes mellitus with diabetic nephropathy: Secondary | ICD-10-CM

## 2015-06-18 DIAGNOSIS — F250 Schizoaffective disorder, bipolar type: Secondary | ICD-10-CM

## 2015-06-18 DIAGNOSIS — F0280 Dementia in other diseases classified elsewhere without behavioral disturbance: Secondary | ICD-10-CM

## 2015-06-18 DIAGNOSIS — E034 Atrophy of thyroid (acquired): Secondary | ICD-10-CM | POA: Diagnosis not present

## 2015-06-18 DIAGNOSIS — R944 Abnormal results of kidney function studies: Secondary | ICD-10-CM | POA: Diagnosis not present

## 2015-06-18 DIAGNOSIS — E1165 Type 2 diabetes mellitus with hyperglycemia: Secondary | ICD-10-CM

## 2015-06-18 DIAGNOSIS — I10 Essential (primary) hypertension: Secondary | ICD-10-CM

## 2015-06-18 DIAGNOSIS — Z09 Encounter for follow-up examination after completed treatment for conditions other than malignant neoplasm: Secondary | ICD-10-CM

## 2015-06-18 MED ORDER — INSULIN ASPART PROT & ASPART (70-30 MIX) 100 UNIT/ML PEN: PEN_INJECTOR | SUBCUTANEOUS | Status: DC

## 2015-06-18 NOTE — Progress Notes (Signed)
Pre visit review using our clinic review tool, if applicable. No additional management support is needed unless otherwise documented below in the visit note. 

## 2015-06-18 NOTE — Progress Notes (Addendum)
Subjective:  Patient ID: [name], female    DOB: June 10, 1935  Age: 80 y.o. MRN: [id number]  CC: The primary encounter diagnosis was Hypothyroidism due to acquired atrophy of thyroid. Diagnoses of Decreased GFR, Uncontrolled type 2 diabetes mellitus with diabetic nephropathy, with long-term current use of insulin (HCC), Schizoaffective disorder, bipolar type (HCC), Mixed Alzheimer's and vascular dementia, and Essential hypertension were also pertinent to this visit.  HPI [name] presents for follow up on recent admission to [facility] on Jan 21 and discharged on Feb 8th  .  patient called EMS and was taken to [facility] ED on Jan 19th with paranoia  And admitted, then transferred to  [facility] .  No records available except through [facility] portal   Admitted to [facility] on Jan 21 and discharged on Feb 8th.  schizoaffective disorder, bipolar type .  Depakote EC 500 mg bid started , alprazolam reduced to bedtime only,  And risperidone 3 mg at bedtime .  Dementia also suggested by MRI .  She was not deemed incompetent at time of discharge since she was cooperative, and did not want to go to ALF so daughter was unable to orchestrate a change in residence.    Psych follow up  At [facility] March 27 .  Had a urine drug screen and intake [name] was done yesterday . Has appt with psychiatrist ,  Dr [name] on Friday at 5 pm.   Daughter [name] is helping with meds,  meds are bubblepacked and has a CME coming over  9-12   Then [name] stays from 5-8   Lasix 40 mg bid started on feb 14 by Dr Thedore Mins  gFR imrpoved to 34 ml/min    Lab Results  Component Value Date   HGBA1C 7.3* 04/30/2015        Outpatient Prescriptions Prior to Visit  Medication Sig Dispense Refill  . ALPRAZolam (XANAX) 0.5 MG tablet TAKE ONE TABLET TWICE DAILY AS NEEDED FOR ANXIETY (Patient taking differently: Take 0.5 mg by mouth 2 (two) times daily as needed for anxiety. ) 60 tablet 1    . aspirin EC 81 MG tablet Take 81 mg by mouth daily.    Marland Kitchen atenolol (TENORMIN) 100 MG tablet Take 1 tablet (100 mg total) by mouth daily. (Patient taking differently: Take 100 mg by mouth 2 (two) times daily. ) 60 tablet 4  . carvedilol (COREG) 3.125 MG tablet Take 1 tablet (3.125 mg total) by mouth 2 (two) times daily with a meal. 60 tablet 2  . cloNIDine (CATAPRES) 0.1 MG tablet Take 1 tablet (0.1 mg total) by mouth as needed. (Patient taking differently: Take 0.1 mg by mouth daily as needed (high blood pressure). ) 60 tablet 1  . insulin lispro (HUMALOG) 100 UNIT/ML injection Inject 10 Units into the skin 3 (three) times daily with meals.    Marland Kitchen levothyroxine (SYNTHROID) 25 MCG tablet Take 1 tablet (25 mcg total) by mouth daily before breakfast. (Patient taking differently: Take 12.5 mcg by mouth daily before breakfast. ) 90 tablet 1  . omeprazole (PRILOSEC) 40 MG capsule Take 1 capsule (40 mg total) by mouth daily. 30 capsule 0  . rosuvastatin (CRESTOR) 20 MG tablet Take 1 tablet (20 mg total) by mouth daily. 30 tablet 2  . Insulin Isophane & Regular Human (HUMULIN 70/30 KWIKPEN) (70-30) 100 UNIT/ML PEN Take 10 units Twice a day subcutaneously 15 mL 11  . insulin regular (HUMULIN R) 100 units/mL injection  Inject 0.1 mLs (10 Units total) into the skin 3 (three) times daily before meals. 10 mL 11  . lactulose (CHRONULAC) 10 GM/15ML solution Take 15 mLs (10 g total) by mouth once a week. As needed for constipation 240 mL 1  . traMADol (ULTRAM) 50 MG tablet Take 1 tablet (50 mg total) by mouth every 6 (six) hours as needed. 20 tablet 0  . ULORIC 40 MG tablet TAKE ONE (1) TABLET EACH DAY 30 tablet 5  . valsartan (DIOVAN) 320 MG tablet Take 1 tablet (320 mg total) by mouth daily. 30 tablet 5   No facility-administered medications prior to visit.    Review of Systems;  Patient denies headache, fevers, malaise, unintentional weight loss, skin rash, eye pain, sinus congestion and sinus pain, sore  throat, dysphagia,  hemoptysis , cough, dyspnea, wheezing, chest pain, palpitations, orthopnea, edema, abdominal pain, nausea, melena, diarrhea, constipation, flank pain, dysuria, hematuria, urinary  Frequency, nocturia, numbness, tingling, seizures,  Focal weakness, Loss of consciousness,  Tremor, insomnia, depression, anxiety, and suicidal ideation.      Objective:  BP 142/60 mmHg  Pulse 88  Temp(Src) 97.6 F (36.4 C) (Oral)  Resp 14  Ht 5' (1.524 m)  Wt 191 lb 3.2 oz (86.728 kg)  BMI 37.34 kg/m2  SpO2 98%  BP Readings from Last 3 Encounters:  06/18/15 142/60  05/17/15 160/80  05/15/15 119/50    Wt Readings from Last 3 Encounters:  06/18/15 191 lb 3.2 oz (86.728 kg)  05/16/15 191 lb (86.637 kg)  05/15/15 191 lb (86.637 kg)    General appearance: alert, cooperative and appears stated age Ears: normal TM's and external ear canals both ears Throat: lips, mucosa, and tongue normal; teeth and gums normal Neck: no adenopathy, no carotid bruit, supple, symmetrical, trachea midline and thyroid not enlarged, symmetric, no tenderness/mass/nodules Back: symmetric, no curvature. ROM normal. No CVA tenderness. Lungs: clear to auscultation bilaterally Heart: regular rate and rhythm, S1, S2 normal, no murmur, click, rub or gallop Abdomen: soft, non-tender; bowel sounds normal; no masses,  no organomegaly Pulses: 2+ and symmetric Skin: Skin color, texture, turgor normal. No rashes or lesions Lymph nodes: Cervical, supraclavicular, and axillary nodes normal.  Lab Results  Component Value Date   HGBA1C 7.3* 04/30/2015   HGBA1C 7.8* 01/27/2015   HGBA1C 7.0* 10/24/2014    Lab Results  Component Value Date   CREATININE 2.11* 06/18/2015   CREATININE 1.74* 05/16/2015   CREATININE 1.71* 05/15/2015    Lab Results  Component Value Date   WBC 4.8 05/16/2015   HGB 11.8* 05/16/2015   HCT 33.9* 05/16/2015   PLT 216 05/16/2015   GLUCOSE 130* 06/18/2015   CHOL 123 10/24/2014   TRIG  85.0 10/24/2014   HDL 38.40* 10/24/2014   LDLDIRECT 65.0 04/30/2015   LDLCALC 68 10/24/2014   ALT 25 05/16/2015   AST 21 05/16/2015   NA 142 06/18/2015   K 3.7 06/18/2015   CL 99 06/18/2015   CREATININE 2.11* 06/18/2015   BUN 29* 06/18/2015   CO2 33* 06/18/2015   TSH 5.450* 06/18/2015   INR 1.0 01/27/2013   HGBA1C 7.3* 04/30/2015   MICROALBUR 79.4* 05/07/2015    No results found.  Assessment & Plan:   Problem List Items Addressed This Visit    Hypertension    No changes to regimen today.  Patient taking lasix 40 mg twice daily per Nephrology and GFR improving       Type 2 diabetes, uncontrolled, with renal manifestation (HCC)  Improving control.  Patient wants to resume the 70/30 flex pen for ease of administration \. Start with 20 units in the AM and 10 units in the PM.  Continue use of  statin and an ARB.   Lab Results  Component Value Date   HGBA1C 7.3* 04/30/2015   Lab Results  Component Value Date   MICROALBUR 79.4* 05/07/2015   Lab Results  Component Value Date   CREATININE 2.11* 06/18/2015                   Relevant Medications   insulin aspart protamine - aspart (NOVOLOG 70/30 MIX) (70-30) 100 UNIT/ML FlexPen   Schizoaffective disorder, bipolar type (HCC)    With recent [facility] admission for paranoia with delusions.  patient now medicated ,  Daughter supervision medications. Has follow up with [facility].       Mixed Alzheimer's and vascular dementia    there is very little I can do to encourage her to change from an independent living to assisted living, as her daughter would prefer . Referral to neurology requested by family for further evaluation         Relevant Medications   divalproex (DEPAKOTE) 500 MG DR tablet   memantine (NAMENDA) 5 MG tablet   risperiDONE (RISPERDAL) 3 MG tablet   traZODone (DESYREL) 50 MG tablet   Hypothyroidism - Primary    Dose was increased to 25 mcg daily during recent hospitalization, so current  elevated TSH is not  reflective of 6 weeks of therapy . Continue 25 mcg daily for now./       Relevant Orders   T4 AND TSH (Completed)    Other Visit Diagnoses    Decreased GFR        Relevant Orders    Basic metabolic panel (Completed)       I have discontinued Ms. Taddeo's lactulose, Insulin Isophane & Regular Human, ULORIC, traMADol, valsartan, and insulin regular. I am also having her start on insulin aspart protamine - aspart. Additionally, I am having her maintain her aspirin EC, cloNIDine, carvedilol, ALPRAZolam, rosuvastatin, atenolol, levothyroxine, omeprazole, insulin lispro, allopurinol, bisacodyl, divalproex, memantine, risperiDONE, and traZODone.  Meds ordered this encounter  Medications  . allopurinol (ZYLOPRIM) 300 MG tablet    Sig: Take by mouth.  . bisacodyl (DULCOLAX) 5 MG EC tablet    Sig: Take by mouth.  . divalproex (DEPAKOTE) 500 MG DR tablet    Sig: Take by mouth.  . memantine (NAMENDA) 5 MG tablet    Sig: Take by mouth.  . risperiDONE (RISPERDAL) 3 MG tablet    Sig: Take by mouth.  . traZODone (DESYREL) 50 MG tablet    Sig: Take by mouth.  . insulin aspart protamine - aspart (NOVOLOG 70/30 MIX) (70-30) 100 UNIT/ML FlexPen    Sig: 20 units in the morning and 10 units  in the evening    Dispense:  30 mL    Refill:  2    90day supply    Medications Discontinued During This Encounter  Medication Reason  . ULORIC 40 MG tablet Completed Course  . valsartan (DIOVAN) 320 MG tablet Completed Course  . traMADol (ULTRAM) 50 MG tablet Completed Course  . lactulose (CHRONULAC) 10 GM/15ML solution Completed Course  . Insulin Isophane & Regular Human (HUMULIN 70/30 KWIKPEN) (70-30) 100 UNIT/ML PEN Completed Course  . insulin regular (HUMULIN R) 100 units/mL injection Completed Course  A total of 25 minutes of face to face time was spent with  patient more than half of which was spent in counselling about the above mentioned conditions  and coordination of care    Follow-up: Return in about 2 months (around 08/16/2015), or FASTING LABS PRIOR TO APPT AFTER APRIL 4 .   Sherlene Shams, MD

## 2015-06-18 NOTE — Patient Instructions (Signed)
If her insurance will pay for the 70/30 Flex pen ,  We can switch her back to twice daily use of flex pen   We are checking thyroid today

## 2015-06-19 LAB — BASIC METABOLIC PANEL
BUN: 29 mg/dL — AB (ref 6–23)
CALCIUM: 9.3 mg/dL (ref 8.4–10.5)
CO2: 33 mEq/L — ABNORMAL HIGH (ref 19–32)
CREATININE: 2.11 mg/dL — AB (ref 0.40–1.20)
Chloride: 99 mEq/L (ref 96–112)
GFR: 29.19 mL/min — AB (ref >60.00)
GLUCOSE: 130 mg/dL — AB (ref 70–99)
POTASSIUM: 3.7 meq/L (ref 3.5–5.1)
Sodium: 142 mEq/L (ref 135–145)

## 2015-06-19 LAB — T4 AND TSH
T4, Total: 7.5 ug/dL (ref 4.5–12.0)
TSH: 5.45 u[IU]/mL — AB (ref 0.450–4.500)

## 2015-06-20 NOTE — Assessment & Plan Note (Addendum)
Dose was increased to 25 mcg daily on Jan 5.  Will increase dose

## 2015-06-21 DIAGNOSIS — Z09 Encounter for follow-up examination after completed treatment for conditions other than malignant neoplasm: Secondary | ICD-10-CM | POA: Insufficient documentation

## 2015-06-21 DIAGNOSIS — Z7689 Persons encountering health services in other specified circumstances: Secondary | ICD-10-CM

## 2015-06-21 NOTE — Assessment & Plan Note (Signed)
there is very little I can do to encourage her to change from an independent living to assisted living, as her daughter would prefer . Referral to neurology requested by family for further evaluation

## 2015-06-21 NOTE — Assessment & Plan Note (Signed)
Improving control.  Patient wants to resume the 70/30 flex pen for ease of administration \. Start with 20 units in the AM and 10 units in the PM.  Continue use of  statin and an ARB.   Lab Results  Component Value Date   HGBA1C 7.3* 04/30/2015   Lab Results  Component Value Date   MICROALBUR 79.4* 05/07/2015   Lab Results  Component Value Date   CREATININE 2.11* 06/18/2015

## 2015-06-21 NOTE — Assessment & Plan Note (Signed)
hospital follow up.  I have reviewed the records from the hospital admission in detail with patient today.   

## 2015-06-21 NOTE — Assessment & Plan Note (Signed)
With recent [facility] admission for paranoia with delusions.  patient now medicated ,  Daughter supervision medications. Has follow up with [facility].

## 2015-06-21 NOTE — Assessment & Plan Note (Signed)
No changes to regimen today.  Patient taking lasix 40 mg twice daily per Nephrology and GFR improving

## 2015-06-22 ENCOUNTER — Telehealth: Payer: Self-pay | Admitting: Internal Medicine

## 2015-06-22 NOTE — Telephone Encounter (Signed)
Form for home health aide from [place] completed and in red folder.  Please charge $50 for the abbreviated FL-2 form

## 2015-06-23 MED ORDER — LEVOTHYROXINE SODIUM 50 MCG PO TABS: 50.0000 ug | ORAL_TABLET | Freq: Every day | ORAL | Status: AC

## 2015-06-23 NOTE — Telephone Encounter (Signed)
Form completed and sent to billing and scan.

## 2015-06-23 NOTE — Addendum Note (Signed)
Addended by: Sherlene Shams on: 06/23/2015 01:26 PM   Modules accepted: Orders

## 2015-06-24 NOTE — Addendum Note (Signed)
Addended by: Dennie Bible on: 06/24/2015 04:35 PM   Modules accepted: Orders

## 2015-06-30 ENCOUNTER — Other Ambulatory Visit: Payer: Self-pay

## 2015-06-30 NOTE — Telephone Encounter (Signed)
Please advise as two of the medications are new to you to prescribe. (the Linizess, and the Pens.) Thanks

## 2015-06-30 NOTE — Telephone Encounter (Signed)
Please call patient about the following discrepancies;    She should NOT be taking carvedilol AND atenolol. They are too similar.   She reqursted a change in insulin to the 70/30 flex pens, so she should not be needing the syringes you requested

## 2015-06-30 NOTE — Telephone Encounter (Signed)
Talked with the patient, she needs her caregiver to call me back to confirm which meds she needs as she gets them ordered for her.  thanks

## 2015-07-07 ENCOUNTER — Telehealth: Payer: Self-pay | Admitting: Internal Medicine

## 2015-07-07 DIAGNOSIS — M546 Pain in thoracic spine: Principal | ICD-10-CM

## 2015-07-07 DIAGNOSIS — M545 Low back pain, unspecified: Secondary | ICD-10-CM

## 2015-07-07 NOTE — Telephone Encounter (Signed)
Will try,  But the only diagnoses I have are back pain and venous insufficiency

## 2015-07-07 NOTE — Telephone Encounter (Signed)
Please advise for order for hospital bed. Thanks

## 2015-07-07 NOTE — Telephone Encounter (Signed)
[phone] [facility] regarding [facility] needing [facility] hospital bed to be ordered. Pt is having a hard time getting in and out of bed sometimes she sleeps in chair. Thank you!

## 2015-07-09 NOTE — Telephone Encounter (Signed)
Left message for kelly to cal and advise where to fax DME order for hospital bed.

## 2015-07-09 NOTE — Telephone Encounter (Signed)
PAtient's caregiver has yet to return a phone call to the office, refusing meds until she calls back with update on them. Thanks

## 2015-07-11 ENCOUNTER — Other Ambulatory Visit: Payer: Self-pay

## 2015-07-11 NOTE — Telephone Encounter (Signed)
Spoke with the patient's daughter, she confirmed that patient is NOT taking atenolol anymore (since January 19 th) and is on Carvedilol instead.  Needs refills on that and Xanax.  Patients daughter is going out of town so she is trying to get patient set with her meds today.  Please advise refill. thanks

## 2015-07-15 ENCOUNTER — Telehealth: Payer: Self-pay

## 2015-07-15 MED ORDER — CARVEDILOL 3.125 MG PO TABS: 3.1250 mg | ORAL_TABLET | Freq: Two times a day (BID) | ORAL | Status: DC

## 2015-07-15 MED ORDER — CARVEDILOL 25 MG PO TABS: 25.0000 mg | ORAL_TABLET | Freq: Two times a day (BID) | ORAL | Status: DC

## 2015-07-15 MED ORDER — ALPRAZOLAM 0.5 MG PO TABS: ORAL_TABLET | ORAL | Status: DC

## 2015-07-15 NOTE — Telephone Encounter (Signed)
meds refilled 

## 2015-07-15 NOTE — Telephone Encounter (Signed)
[facility] is questioning the dosage of [name]'s script for Coreg. It was sent over today for a refill carvedilol (COREG) 3.125 MG tablet. Pt was increased to 25 mg of this med by Dr. Tonita PhoenixKomissarova. Please advise the correct dosage the pt should be taking. Thanks

## 2015-07-15 NOTE — Telephone Encounter (Signed)
Medication list updated and new rx for 25 mg dose sent .

## 2015-07-15 NOTE — Telephone Encounter (Signed)
Noted  

## 2015-07-17 ENCOUNTER — Encounter: Payer: Self-pay | Admitting: Family Medicine

## 2015-07-17 ENCOUNTER — Telehealth: Payer: Self-pay

## 2015-07-17 ENCOUNTER — Ambulatory Visit (INDEPENDENT_AMBULATORY_CARE_PROVIDER_SITE_OTHER): Payer: Medicare Other | Admitting: Family Medicine

## 2015-07-17 DIAGNOSIS — R4182 Altered mental status, unspecified: Secondary | ICD-10-CM | POA: Diagnosis not present

## 2015-07-17 LAB — POCT URINALYSIS DIPSTICK
BILIRUBIN UA: NEGATIVE
GLUCOSE UA: NEGATIVE
Nitrite, UA: NEGATIVE
Protein, UA: >=300
SPEC GRAV UA: 1.025
Urobilinogen, UA: 0.2
pH, UA: 5.5

## 2015-07-17 NOTE — Telephone Encounter (Signed)
FYIOkey Regal: [name] from [facility] states that pt has been confused, disoriented, and having tremors in upper arms. [name] noticed that [name] has not been taking her meds correctly b/c she feels she does not need them, so they have been thrown on the floor. Pt's BP was 178/72, Pulse was 88, Temp 97.8, and Resp was 18.

## 2015-07-17 NOTE — Telephone Encounter (Signed)
please give her verbal authorization to collect urine for UA with micro and culture.  I cannot see her today, so if she feels she needs to be seen check with the other providers

## 2015-07-17 NOTE — Progress Notes (Signed)
Pre visit review using our clinic review tool, if applicable. No additional management support is needed unless otherwise documented below in the visit note. 

## 2015-07-17 NOTE — Patient Instructions (Signed)
Continue current medications. Exam normal except for the tremor today.  We will call with the results.  Call with concerns.  Take care  Dr. Adriana Simasook

## 2015-07-17 NOTE — Telephone Encounter (Signed)
Caregiver is bringing in the specimen for UA and Culture

## 2015-07-17 NOTE — Progress Notes (Signed)
Subjective:  Patient ID: [name], female    DOB: 04/17/1936  Age: 80 y.o. MRN: [id number]  CC: Altered mental status  HPI:  80 year old female with a complicated PMH including dementia and schizoaffective disorder presents after home health nurse reported that she has not been herself.  Per the EMR, [name] from [facility] called the office reporting the patient has been confused, disoriented. She also reported that she has had tremor.  Additionally, she states that the patient has not been taking her medications as prescribed. Blood pressure has been elevated.  Patient his company by her son today. History limited given dementia and mental illness. She complains of sore throat and ear pain. Denies dysuria. She states that she has not taken her medication recently as she doesn't think she needs it. Son reports that besides refusal to take medication, she has been her normal self.    Social Hx   Social History   Social History  . Marital Status: Single    Spouse Name: N/A  . Number of Children: N/A  . Years of Education: N/A   Social History Main Topics  . Smoking status: Former Smoker -- 30 years    Types: Cigarettes    Quit date: 09/28/2001  . Smokeless tobacco: Current User    Types: Snuff  . Alcohol Use: No  . Drug Use: No  . Sexual Activity: Not Currently   Other Topics Concern  . None   Social History Narrative   Review of Systems  Constitutional: Negative.   HENT: Positive for ear pain and sore throat.    ROS limited secondary to mental illness/dementia.  Objective:  BP 122/72 mmHg  Pulse 77  Temp(Src) 97.6 F (36.4 C) (Oral)  Wt 189 lb 8 oz (85.957 kg)  SpO2 98%  BP/Weight 07/17/2015 06/18/2015 05/17/2015  Systolic BP 122 142 160  Diastolic BP 72 60 80  Wt. (Lbs) 189.5 191.2 -  BMI 37.01 37.34 -   Physical Exam  Constitutional:  Elderly female in no acute distress.  HENT:  Mouth/Throat: Oropharynx is clear and moist.  Normal TM's bilaterally.    Cardiovascular: Normal rate and regular rhythm.   Pulmonary/Chest: Effort normal and breath sounds normal.  Neurological: She is alert.  Hand tremor noted. Resolved quickly during examination.  Psychiatric:  Flat affect.  Vitals reviewed.  Lab Results  Component Value Date   WBC 4.8 05/16/2015   HGB 11.8* 05/16/2015   HCT 33.9* 05/16/2015   PLT 216 05/16/2015   GLUCOSE 130* 06/18/2015   CHOL 123 10/24/2014   TRIG 85.0 10/24/2014   HDL 38.40* 10/24/2014   LDLDIRECT 65.0 04/30/2015   LDLCALC 68 10/24/2014   ALT 25 05/16/2015   AST 21 05/16/2015   NA 142 06/18/2015   K 3.7 06/18/2015   CL 99 06/18/2015   CREATININE 2.11* 06/18/2015   BUN 29* 06/18/2015   CO2 33* 06/18/2015   TSH 5.450* 06/18/2015   INR 1.0 01/27/2013   HGBA1C 7.3* 04/30/2015   MICROALBUR 79.4* 05/07/2015   Results for orders placed or performed in visit on 07/17/15 (from the past 24 hour(s))  POCT urinalysis dipstick     Status: Abnormal   Collection Time: 07/17/15  4:10 PM  Result Value Ref Range   Color, UA yellow    Clarity, UA cloudy    Glucose, UA neg    Bilirubin, UA neg    Ketones, UA trace    Spec Grav, UA 1.025    Blood, UA  small    pH, UA 5.5    Protein, UA >=300    Urobilinogen, UA 0.2    Nitrite, UA neg    Leukocytes, UA small (1+) (A) Negative     Assessment & Plan:   Problem List Items Addressed This Visit    Altered mental status    New problem. PCP requested UA. It revealed small leukocytes and small blood. Will await culture. Per son, patient at cognitive baseline.  Reported change in status likely secondary to medication noncompliance in setting of dementia/mental illness. Tremor noted on exam but resolved quickly during exam; remainder of exam unremarkable.  Tremor likely from depakote. Obtaining level today. CMP today as well. Advised son to encourage compliance with medication.       Relevant Orders   POCT urinalysis dipstick (Completed)   Urine culture    Comprehensive metabolic panel   Valproic Acid level     Follow-up: PRN  Everlene OtherJayce Donette Mainwaring DO [facility] [place]

## 2015-07-17 NOTE — Telephone Encounter (Signed)
Ms [name] is coming in to see Dr. Adriana Simasook for UTI.

## 2015-07-18 DIAGNOSIS — G9341 Metabolic encephalopathy: Secondary | ICD-10-CM | POA: Insufficient documentation

## 2015-07-18 LAB — COMPREHENSIVE METABOLIC PANEL
ALT: 14 U/L (ref 0–35)
AST: 21 U/L (ref 0–37)
Albumin: 3.5 g/dL (ref 3.5–5.2)
Alkaline Phosphatase: 77 U/L (ref 39–117)
BUN: 20 mg/dL (ref 6–23)
CALCIUM: 9.9 mg/dL (ref 8.4–10.5)
CHLORIDE: 99 meq/L (ref 96–112)
CO2: 37 meq/L — AB (ref 19–32)
Creatinine, Ser: 1.77 mg/dL — ABNORMAL HIGH (ref 0.40–1.20)
GFR: 35.75 mL/min — AB (ref >60.00)
Glucose, Bld: 123 mg/dL — ABNORMAL HIGH (ref 70–99)
POTASSIUM: 3.3 meq/L — AB (ref 3.5–5.1)
Sodium: 144 mEq/L (ref 135–145)
Total Bilirubin: 0.3 mg/dL (ref 0.2–1.2)
Total Protein: 7.3 g/dL (ref 6.0–8.3)

## 2015-07-18 NOTE — Assessment & Plan Note (Addendum)
New problem. PCP requested UA. It revealed small leukocytes and small blood. Will await culture. Per son, patient at cognitive baseline.  Reported change in status likely secondary to medication noncompliance in setting of dementia/mental illness. Tremor noted on exam but resolved quickly during exam; remainder of exam unremarkable.  Tremor likely from depakote. Obtaining level today. CMP today as well. Advised son to encourage compliance with medication.

## 2015-07-20 LAB — URINE CULTURE: Colony Count: >=100000

## 2015-07-21 ENCOUNTER — Telehealth: Payer: Self-pay | Admitting: *Deleted

## 2015-07-21 ENCOUNTER — Other Ambulatory Visit: Payer: Self-pay | Admitting: Internal Medicine

## 2015-07-21 ENCOUNTER — Other Ambulatory Visit: Payer: Self-pay | Admitting: Family Medicine

## 2015-07-21 LAB — VALPROIC ACID LEVEL

## 2015-07-21 MED ORDER — CEPHALEXIN 250 MG PO CAPS
250.0000 mg | ORAL_CAPSULE | Freq: Two times a day (BID) | ORAL | Status: DC
Start: 2015-07-21 — End: 2015-07-28

## 2015-07-21 MED ORDER — POTASSIUM CHLORIDE CRYS ER 20 MEQ PO TBCR: 20.0000 meq | EXTENDED_RELEASE_TABLET | Freq: Every day | ORAL | Status: DC

## 2015-07-21 NOTE — Telephone Encounter (Signed)
[facility] called say for the Valproic, by the time they started to run test it started thawing out and they need a re-collect, tried calling pt didn't get an answer

## 2015-07-22 NOTE — Telephone Encounter (Signed)
Cancel the order?

## 2015-07-22 NOTE — Progress Notes (Signed)
Patient DPR notified. °

## 2015-07-22 NOTE — Telephone Encounter (Signed)
FYI. What would you like to go from here. Would you like her to come in for a re-collect.

## 2015-07-22 NOTE — Telephone Encounter (Signed)
How would i do this after they've been released?

## 2015-07-23 ENCOUNTER — Telehealth: Payer: Self-pay | Admitting: Internal Medicine

## 2015-07-23 ENCOUNTER — Emergency Department: Payer: Medicare Other

## 2015-07-23 ENCOUNTER — Inpatient Hospital Stay
Admission: EM | Admit: 2015-07-23 | Discharge: 2015-07-28 | DRG: 557 | Disposition: A | Discharge status: Skilled Nursing Facility | Payer: Medicare Other | Attending: Internal Medicine | Admitting: Internal Medicine

## 2015-07-23 ENCOUNTER — Encounter: Payer: Self-pay | Admitting: *Deleted

## 2015-07-23 DIAGNOSIS — R4182 Altered mental status, unspecified: Secondary | ICD-10-CM

## 2015-07-23 DIAGNOSIS — R401 Stupor: Secondary | ICD-10-CM | POA: Diagnosis not present

## 2015-07-23 DIAGNOSIS — M6282 Rhabdomyolysis: Secondary | ICD-10-CM | POA: Diagnosis not present

## 2015-07-23 DIAGNOSIS — F17290 Nicotine dependence, other tobacco product, uncomplicated: Secondary | ICD-10-CM | POA: Diagnosis present

## 2015-07-23 DIAGNOSIS — K219 Gastro-esophageal reflux disease without esophagitis: Secondary | ICD-10-CM | POA: Diagnosis present

## 2015-07-23 DIAGNOSIS — Z7982 Long term (current) use of aspirin: Secondary | ICD-10-CM

## 2015-07-23 DIAGNOSIS — E1122 Type 2 diabetes mellitus with diabetic chronic kidney disease: Secondary | ICD-10-CM | POA: Diagnosis present

## 2015-07-23 DIAGNOSIS — R748 Abnormal levels of other serum enzymes: Secondary | ICD-10-CM

## 2015-07-23 DIAGNOSIS — F0390 Unspecified dementia without behavioral disturbance: Secondary | ICD-10-CM | POA: Diagnosis present

## 2015-07-23 DIAGNOSIS — G9341 Metabolic encephalopathy: Secondary | ICD-10-CM | POA: Diagnosis present

## 2015-07-23 DIAGNOSIS — Z794 Long term (current) use of insulin: Secondary | ICD-10-CM

## 2015-07-23 DIAGNOSIS — I509 Heart failure, unspecified: Secondary | ICD-10-CM | POA: Diagnosis present

## 2015-07-23 DIAGNOSIS — N183 Chronic kidney disease, stage 3 (moderate): Secondary | ICD-10-CM | POA: Diagnosis present

## 2015-07-23 DIAGNOSIS — E860 Dehydration: Secondary | ICD-10-CM | POA: Diagnosis present

## 2015-07-23 DIAGNOSIS — Z8249 Family history of ischemic heart disease and other diseases of the circulatory system: Secondary | ICD-10-CM

## 2015-07-23 DIAGNOSIS — Z79899 Other long term (current) drug therapy: Secondary | ICD-10-CM

## 2015-07-23 DIAGNOSIS — E039 Hypothyroidism, unspecified: Secondary | ICD-10-CM | POA: Diagnosis present

## 2015-07-23 DIAGNOSIS — W1830XA Fall on same level, unspecified, initial encounter: Secondary | ICD-10-CM | POA: Diagnosis present

## 2015-07-23 DIAGNOSIS — Y92003 Bedroom of unspecified non-institutional (private) residence as the place of occurrence of the external cause: Secondary | ICD-10-CM

## 2015-07-23 DIAGNOSIS — W19XXXA Unspecified fall, initial encounter: Secondary | ICD-10-CM

## 2015-07-23 DIAGNOSIS — E876 Hypokalemia: Secondary | ICD-10-CM | POA: Diagnosis present

## 2015-07-23 DIAGNOSIS — Z87440 Personal history of urinary (tract) infections: Secondary | ICD-10-CM

## 2015-07-23 DIAGNOSIS — E785 Hyperlipidemia, unspecified: Secondary | ICD-10-CM | POA: Diagnosis present

## 2015-07-23 DIAGNOSIS — I130 Hypertensive heart and chronic kidney disease with heart failure and stage 1 through stage 4 chronic kidney disease, or unspecified chronic kidney disease: Secondary | ICD-10-CM | POA: Diagnosis present

## 2015-07-23 LAB — CBC
HEMATOCRIT: 33.9 % — AB (ref 35.0–47.0)
Hemoglobin: 12 g/dL (ref 12.0–16.0)
MCH: 32.2 pg (ref 26.0–34.0)
MCHC: 35.3 g/dL (ref 32.0–36.0)
MCV: 91.2 fL (ref 80.0–100.0)
Platelets: 140 10*3/uL — ABNORMAL LOW (ref 150–440)
RBC: 3.72 MIL/uL — ABNORMAL LOW (ref 3.80–5.20)
RDW: 14 % (ref 11.5–14.5)
WBC: 4 10*3/uL (ref 3.6–11.0)

## 2015-07-23 LAB — BRAIN NATRIURETIC PEPTIDE: B NATRIURETIC PEPTIDE 5: 92 pg/mL (ref 0.0–100.0)

## 2015-07-23 LAB — URINALYSIS COMPLETE WITH MICROSCOPIC (ARMC ONLY)
BACTERIA UA: NONE SEEN
BILIRUBIN URINE: NEGATIVE
Glucose, UA: NEGATIVE mg/dL
Ketones, ur: NEGATIVE mg/dL
LEUKOCYTES UA: NEGATIVE
Nitrite: NEGATIVE
PH: 7 (ref 5.0–8.0)
PROTEIN: 100 mg/dL — AB
SQUAMOUS EPITHELIAL / LPF: NONE SEEN
Specific Gravity, Urine: 1.009 (ref 1.005–1.030)

## 2015-07-23 LAB — BASIC METABOLIC PANEL
Anion gap: 8 (ref 5–15)
BUN: 17 mg/dL (ref 6–20)
CHLORIDE: 97 mmol/L — AB (ref 101–111)
CO2: 33 mmol/L — AB (ref 22–32)
Calcium: 9.5 mg/dL (ref 8.9–10.3)
Creatinine, Ser: 1.71 mg/dL — ABNORMAL HIGH (ref 0.44–1.00)
GFR calc Af Amer: 32 mL/min — ABNORMAL LOW (ref >60)
GFR calc non Af Amer: 28 mL/min — ABNORMAL LOW (ref >60)
GLUCOSE: 166 mg/dL — AB (ref 65–99)
POTASSIUM: 3.1 mmol/L — AB (ref 3.5–5.1)
SODIUM: 138 mmol/L (ref 135–145)

## 2015-07-23 LAB — BLOOD GAS, VENOUS
Acid-Base Excess: 15.2 mmol/L — ABNORMAL HIGH (ref 0.0–3.0)
Bicarbonate: 41 mEq/L — ABNORMAL HIGH (ref 21.0–28.0)
O2 SAT: 75.4 %
PATIENT TEMPERATURE: 37
pCO2, Ven: 55 mmHg (ref 44.0–60.0)
pH, Ven: 7.48 — ABNORMAL HIGH (ref 7.320–7.430)
pO2, Ven: 37 mmHg (ref 31.0–45.0)

## 2015-07-23 LAB — URINE DRUG SCREEN, QUALITATIVE (ARMC ONLY)
Amphetamines, Ur Screen: NOT DETECTED
BARBITURATES, UR SCREEN: NOT DETECTED
BENZODIAZEPINE, UR SCRN: POSITIVE — AB
CANNABINOID 50 NG, UR ~~LOC~~: NOT DETECTED
Cocaine Metabolite,Ur ~~LOC~~: NOT DETECTED
MDMA (Ecstasy)Ur Screen: NOT DETECTED
Methadone Scn, Ur: NOT DETECTED
Opiate, Ur Screen: NOT DETECTED
Phencyclidine (PCP) Ur S: NOT DETECTED
TRICYCLIC, UR SCREEN: NOT DETECTED

## 2015-07-23 LAB — CK: Total CK: 1386 U/L — ABNORMAL HIGH (ref 38–234)

## 2015-07-23 LAB — ETHANOL

## 2015-07-23 LAB — AMMONIA: Ammonia: 14 umol/L (ref 9–35)

## 2015-07-23 LAB — GLUCOSE, CAPILLARY: GLUCOSE-CAPILLARY: 137 mg/dL — AB (ref 65–99)

## 2015-07-23 NOTE — ED Notes (Addendum)
Family states when they came to check on pt this AM she was on the floor at the foot of her bed at 0930, daughter states when she saw her mother last night she was herself, pt arrives awake but sleepy, responds to voice, lower extremity edema, states body soreness, speech clear, grips weak  But even, face symmetrical, daughter states she is actively being treated for a UTI with Keflex, daughter states pt was like this on Sunday but normal on Monday

## 2015-07-23 NOTE — Telephone Encounter (Signed)
Notified daughter of [name]'s comments. Pt's daughter states that she will bring in the form with as much information filled out as possible. Pt's daughter is aware that paperwork can take upto a week to be filled out.

## 2015-07-23 NOTE — Telephone Encounter (Signed)
Pt daughter Margaretha GlassingLoretta called wanting to know if we have any Fl2 in the office or does daughter need to bring one in? Call daughter @ [phone]. Thank you!

## 2015-07-23 NOTE — Telephone Encounter (Signed)
FL2 is paperwork for a facility admission.  It is easier for them to bring it in with as much completed as possible and then it will take up to a week to complete.  Thanks

## 2015-07-23 NOTE — Telephone Encounter (Signed)
What is a F12? Please advise, thanks

## 2015-07-23 NOTE — ED Provider Notes (Signed)
[facility] Emergency Department Provider Note  ____________________________________________  Time seen: Approximately 7:24 PM  I have reviewed the triage vital signs and the nursing notes.   HISTORY  Chief Complaint Weakness  The patient history and physical is limited due to her altered mental status. She is accompanied by her daughter who is her primary care taker who gives most of the history.  HPI [name] is a 80 y.o. female with a history of HTN, DM and CHF presenting for altered mental status.The patient was in her usual state of health at 9:30 PM last night and that was the last that she was seen normal. 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 This morning, she did not answer the door and was found lying on the ground. The family was able to get her up and she was able to answer basic questions but she could not describe what had happened the previous night. Today she sat in a chair and did not move, did not go to the bathroom. This evening she had a shuffling gait and required a walker. After dinner she became more and more somnolent with decreased responsiveness so the family brought her here. There is no recent changes in her medications although she is newly being treated with Keflex for urinary tract infection. No recent history of fever, chills, nausea vomiting or diarrhea, abdominal pain, chest pain lightheadedness or sig be.   Past Medical History  Diagnosis Date  . Diabetes mellitus   . GERD (gastroesophageal reflux disease)   . Hypertension   . Anemia   . Arthritis   . Hemorrhoids   . Hypertension   . CHF (congestive heart failure) (HCC)   . Renal disorder     Patient Active Problem List   Diagnosis Date Noted  . Altered mental status 07/18/2015  . Hospital discharge  follow-up 06/21/2015  . Mixed Alzheimer's and vascular dementia 06/02/2015  . Schizoaffective disorder, bipolar type (HCC) 05/17/2015  . Psychosis 05/16/2015  . CKD (chronic kidney disease) stage 3, GFR 30-59 ml/min 05/03/2015  . Noncompliance with medication treatment due to overuse of medication 01/28/2015  . Hypothyroidism 01/28/2015  . Dysuria 08/07/2014  . Back pain of thoracolumbar region 09/23/2013  . Visit for preventive health examination 08/18/2013  . Obesity 07/07/2013  . Depression with anxiety 06/26/2013  . Hemorrhoids 11/06/2012  . Hidradenitis suppurativa 10/05/2012  . Venous insufficiency of leg 11/24/2011  . Type 2 diabetes, uncontrolled, with renal manifestation (HCC) 10/03/2011  . Anemia of chronic renal failure, stage 4 (severe) (HCC) 10/03/2011  . Chest pain with normal coronary angiography 04/21/2011  . Screening for colon cancer 04/12/2011  . Screening for breast cancer 04/12/2011  . Hypertension 04/12/2011  . Hyperlipidemia with target LDL less than 100 04/12/2011  . Screening for osteoporosis 04/12/2011    Past Surgical History  Procedure Laterality Date  . Cholecystectomy    . Abdominal hysterectomy    . Back surgery    . Arthroplasty w/ arthroscopy medial / lateral compartment knee  Feb 2011    [name]  . Hemorrhoid surgery  12/19/12    Current Outpatient Rx  Name  Route  Sig  Dispense  Refill  . allopurinol (ZYLOPRIM) 300 MG tablet   Oral   Take by mouth.         Marland Kitchen  ALPRAZolam (XANAX) 0.5 MG tablet      TAKE ONE TABLET TWICE DAILY AS NEEDED FOR ANXIETY   60 tablet   5   . aspirin EC 81 MG tablet   Oral   Take 81 mg by mouth daily.         . carvedilol (COREG) 25 MG tablet   Oral   Take 1 tablet (25 mg total) by mouth 2 (two) times daily with a meal.   60 tablet   2   . cephALEXin (KEFLEX) 250 MG capsule   Oral   Take 1 capsule (250 mg total) by mouth 2 (two) times daily.   10 capsule   0   . cloNIDine (CATAPRES) 0.1 MG  tablet   Oral   Take 1 tablet (0.1 mg total) by mouth as needed. Patient taking differently: Take 0.1 mg by mouth daily as needed (high blood pressure).    60 tablet   1   . divalproex (DEPAKOTE) 500 MG DR tablet   Oral   Take by mouth 2 (two) times daily.          . furosemide (LASIX) 40 MG tablet   Oral   Take 40 mg by mouth 2 (two) times daily.         . insulin lispro (HUMALOG) 100 UNIT/ML injection   Subcutaneous   Inject 10 Units into the skin 3 (three) times daily with meals.         Marland Kitchen levothyroxine (SYNTHROID, LEVOTHROID) 50 MCG tablet   Oral   Take 1 tablet (50 mcg total) by mouth daily before breakfast.   90 tablet   1   . LINZESS 145 MCG CAPS capsule      1 capsule daily as needed.            Dispense as written.   . memantine (NAMENDA) 5 MG tablet   Oral   Take by mouth.         Marland Kitchen omeprazole (PRILOSEC) 40 MG capsule   Oral   Take 1 capsule (40 mg total) by mouth daily.   30 capsule   0   . potassium chloride SA (K-DUR,KLOR-CON) 20 MEQ tablet   Oral   Take 1 tablet (20 mEq total) by mouth daily. For 5 days   5 tablet   0   . risperiDONE (RISPERDAL) 3 MG tablet   Oral   Take 3 mg by mouth at bedtime.         . rosuvastatin (CRESTOR) 20 MG tablet   Oral   Take 1 tablet (20 mg total) by mouth daily.   30 tablet   2   . traZODone (DESYREL) 50 MG tablet   Oral   Take by mouth.         . insulin aspart protamine - aspart (NOVOLOG 70/30 MIX) (70-30) 100 UNIT/ML FlexPen      20 units in the morning and 10 units  in the evening   30 mL   2     90day supply     Allergies Hydrocodone-acetaminophen and Morphine and related  Family History  Problem Relation Age of Onset  . Heart disease Mother   . Hypertension Mother   . Cancer Mother     Social History Social History  Substance Use Topics  . Smoking status: Former Smoker -- 30 years    Types: Cigarettes    Quit date: 09/28/2001  . Smokeless tobacco: Current User     Types:  Snuff  . Alcohol Use: No    Review of Systems Constitutional: No fever/chills.  Positive fall.  Positive ams.  Unknown syncope.  Diffuse "soreness" Eyes: No visual changes.  No blurred or double vision. ENT: No sore throat. No congestion or rhinorrhea. Cardiovascular: Denies chest pain. Denies palpitations. Respiratory: Denies shortness of breath.  No cough. Gastrointestinal: No abdominal pain.  No nausea, no vomiting.  No diarrhea.  No constipation. Genitourinary: Negative for dysuria. Musculoskeletal: Negative for back pain. Skin: Negative for rash. Neurological: Negative for headaches. No focal numbness, tingling or weakness.   10-point ROS otherwise negative.  ____________________________________________   PHYSICAL EXAM:  VITAL SIGNS: ED Triage Vitals  Enc Vitals Group     BP 07/23/15 1847 170/37 mmHg     Pulse Rate 07/23/15 1847 73     Resp 07/23/15 1847 18     Temp 07/23/15 1847 98.2 F (36.8 C)     Temp Source 07/23/15 1847 Oral     SpO2 07/23/15 1847 100 %     Weight 07/23/15 1847 189 lb (85.73 kg)     Height 07/23/15 1847  (1.702 m)     Head Cir --      Peak Flow --      Pain Score 07/23/15 1848 3     Pain Loc --      Pain Edu? --      Excl. in GC? --     Constitutional: Pt is alert but oriented only to name, place and year.  She says it is October and does not know the president.  She is somnolent on exam but in no acute distress. Eyes: Conjunctivae are normal.  EOMI. No scleral icterus.  No eye discharge. Head: Atraumatic. No raccoon eyes or Battle sign Nose: No congestion/rhinnorhea. Mouth/Throat: Mucous membranes are dry. No malocclusion or dental injury Neck: No stridor.  Supple.  No meningismus. No midline C-spine tenderness to palpation, step-offs or deformities. Cardiovascular: Normal rate, regular rhythm. No murmurs, rubs or gallops.  Respiratory: Normal respiratory effort.  No accessory muscle use or retractions. Lungs CTAB.  No  wheezes, rales or ronchi. Gastrointestinal: Obese. Soft, nontender and nondistended.  No guarding or rebound.  No peritoneal signs. Musculoskeletal: No LE edema. No ttp in the calves or palpable cords.  Negative Homan's sign. Significant bilateral lower extremity swelling which is symmetric. Patient has clinical signs consistent with chronic lymphedema.Full range of motion of the bilateral hips, knees and ankles without pain. Neurologic: Alert and not oriented. Speech is slurred.  Occasional nonsensical responses.  Left-sided facial droop but symmetric smile. EOMI and PERRLA. Tongue is midline. No pronator drift. 5 out of 5 grip, biceps, triceps, hip flexors, plantar flexion and dorsiflexion. Normal sensation to light touch in the bilateral upper and lower extremities, and face. Unable to comply with heel-to-shin.  Skin:  Skin is warm, dry and intact. No rash noted. Psychiatric: Unable to judge due to patient mental status.  ____________________________________________   LABS (all labs ordered are listed, but only abnormal results are displayed)  Labs Reviewed  BASIC METABOLIC PANEL - Abnormal; Notable for the following:    Potassium 3.1 (*)    Chloride 97 (*)    CO2 33 (*)    Glucose, Bld 166 (*)    Creatinine, Ser 1.71 (*)    GFR calc non Af Amer 28 (*)    GFR calc Af Amer 32 (*)    All other components within normal limits  CBC - Abnormal; Notable  for the following:    RBC 3.72 (*)    HCT 33.9 (*)    Platelets 140 (*)    All other components within normal limits  URINALYSIS COMPLETEWITH MICROSCOPIC ([facility] ONLY) - Abnormal; Notable for the following:    Color, Urine STRAW (*)    APPearance CLEAR (*)    Hgb urine dipstick 2+ (*)    Protein, ur 100 (*)    All other components within normal limits  CK - Abnormal; Notable for the following:    Total CK 1386 (*)    All other components within normal limits  GLUCOSE, CAPILLARY - Abnormal; Notable for the following:     Glucose-Capillary 137 (*)    All other components within normal limits  BRAIN NATRIURETIC PEPTIDE  AMMONIA  BLOOD GAS, VENOUS  CBG MONITORING, ED   ____________________________________________  EKG  ED ECG REPORT I, Rockne Menghini, the attending physician, personally viewed and interpreted this ECG.   Date: 07/23/2015  EKG Time: 1849  Rate: 73  Rhythm: normal sinus rhythm  Axis: Leftward  Intervals:first-degree A-V block   ST&T Change: No ST elevation  ____________________________________________  RADIOLOGY  Dg Pelvis 1-2 Views  07/23/2015  CLINICAL DATA:  Fall EXAM: PELVIS - 1-2 VIEW COMPARISON:  None. FINDINGS: There is no evidence of pelvic fracture or diastasis. Suboptimal study due to patient's large body habitus. Mild degenerative changes bilateral hip joints with mild superior acetabular spurring. IMPRESSION: No acute fracture or subluxation. Mild degenerative changes bilateral hip joints. Electronically Signed   By: Natasha Mead M.D.   On: 07/23/2015 20:25   Ct Head Wo Contrast  07/23/2015  CLINICAL DATA:  Fall. EXAM: CT HEAD WITHOUT CONTRAST TECHNIQUE: Contiguous axial images were obtained from the base of the skull through the vertex without intravenous contrast. COMPARISON:  01/06/2014 head CT. FINDINGS: No evidence of parenchymal hemorrhage or extra-axial fluid collection. No mass lesion, mass effect, or midline shift. No CT evidence of acute infarction. Intracranial atherosclerosis. Mild diffuse cerebral volume loss. Nonspecific mild subcortical and periventricular white matter hypodensity, most in keeping with chronic small vessel ischemic change. No ventriculomegaly. The visualized paranasal sinuses are essentially clear. The mastoid air cells are unopacified. No evidence of calvarial fracture. IMPRESSION: 1. No evidence of acute intracranial abnormality. No calvarial fracture . 2. Mild cerebral volume loss and mild chronic small vessel ischemia. Electronically  Signed   By: Delbert Phenix M.D.   On: 07/23/2015 19:20    ____________________________________________   PROCEDURES  Procedure(s) performed: None  Critical Care performed: No ____________________________________________   INITIAL IMPRESSION / ASSESSMENT AND PLAN / ED COURSE  Pertinent labs & imaging results that were available during my care of the patient were reviewed by me and considered in my medical decision making (see chart for details).  80 y.o. female with unwitnessed fall for unknown duration last night, unknown if syncope, with progressively worsening mental status throughout the day. On my exam, the patient is attended and has a left facial droop although her motor and sensory exam appear normal. She is hypertensive and I will evaluate her for intracranial bleeding, and consider acute stroke. We will also evaluate her for urinary tract infection, electrolyte abnormalities, cardiac causes.  She will require admission to the hospital.  She does not appear to have any injuries from her fall, but I will get a pelvis to rule out any acute fracture.  ----------------------------------------- 9:32 PM on 07/23/2015 -----------------------------------------  The patient continues to be hemodynamically stable, with no changes in  her mental status. Her urinalysis does not show UTI. Her CT scan does not show any acute stroke. The remainder of her labs are within normal limits except for CK which is 1300 does not have renal insufficiency associated with this, and I will treat her by initiating IV fluids. She is a mild hypo-kalemia as well. Plan admission.  ____________________________________________  FINAL CLINICAL IMPRESSION(S) / ED DIAGNOSES  Final diagnoses:  Altered mental status, unspecified altered mental status type  Elevated CK  Stupor      NEW MEDICATIONS STARTED DURING THIS VISIT:  New Prescriptions   No medications on file     Rockne MenghiniAnne-Caroline Camya Haydon, MD 07/23/15  2136

## 2015-07-24 ENCOUNTER — Encounter: Payer: Self-pay | Admitting: Internal Medicine

## 2015-07-24 DIAGNOSIS — I509 Heart failure, unspecified: Secondary | ICD-10-CM | POA: Diagnosis present

## 2015-07-24 DIAGNOSIS — M6282 Rhabdomyolysis: Secondary | ICD-10-CM | POA: Diagnosis present

## 2015-07-24 DIAGNOSIS — W1830XA Fall on same level, unspecified, initial encounter: Secondary | ICD-10-CM | POA: Diagnosis present

## 2015-07-24 DIAGNOSIS — E039 Hypothyroidism, unspecified: Secondary | ICD-10-CM | POA: Diagnosis present

## 2015-07-24 DIAGNOSIS — Y92003 Bedroom of unspecified non-institutional (private) residence as the place of occurrence of the external cause: Secondary | ICD-10-CM | POA: Diagnosis not present

## 2015-07-24 DIAGNOSIS — Z8249 Family history of ischemic heart disease and other diseases of the circulatory system: Secondary | ICD-10-CM | POA: Diagnosis not present

## 2015-07-24 DIAGNOSIS — Z87440 Personal history of urinary (tract) infections: Secondary | ICD-10-CM | POA: Diagnosis not present

## 2015-07-24 DIAGNOSIS — E785 Hyperlipidemia, unspecified: Secondary | ICD-10-CM | POA: Diagnosis present

## 2015-07-24 DIAGNOSIS — K219 Gastro-esophageal reflux disease without esophagitis: Secondary | ICD-10-CM | POA: Diagnosis present

## 2015-07-24 DIAGNOSIS — I130 Hypertensive heart and chronic kidney disease with heart failure and stage 1 through stage 4 chronic kidney disease, or unspecified chronic kidney disease: Secondary | ICD-10-CM | POA: Diagnosis present

## 2015-07-24 DIAGNOSIS — E1122 Type 2 diabetes mellitus with diabetic chronic kidney disease: Secondary | ICD-10-CM | POA: Diagnosis present

## 2015-07-24 DIAGNOSIS — F0390 Unspecified dementia without behavioral disturbance: Secondary | ICD-10-CM | POA: Diagnosis present

## 2015-07-24 DIAGNOSIS — F17290 Nicotine dependence, other tobacco product, uncomplicated: Secondary | ICD-10-CM | POA: Diagnosis present

## 2015-07-24 DIAGNOSIS — Z794 Long term (current) use of insulin: Secondary | ICD-10-CM | POA: Diagnosis not present

## 2015-07-24 DIAGNOSIS — G9341 Metabolic encephalopathy: Secondary | ICD-10-CM | POA: Diagnosis present

## 2015-07-24 DIAGNOSIS — E876 Hypokalemia: Secondary | ICD-10-CM | POA: Diagnosis present

## 2015-07-24 DIAGNOSIS — R401 Stupor: Secondary | ICD-10-CM | POA: Diagnosis present

## 2015-07-24 DIAGNOSIS — E860 Dehydration: Secondary | ICD-10-CM | POA: Diagnosis present

## 2015-07-24 DIAGNOSIS — Z7982 Long term (current) use of aspirin: Secondary | ICD-10-CM | POA: Diagnosis not present

## 2015-07-24 DIAGNOSIS — N183 Chronic kidney disease, stage 3 (moderate): Secondary | ICD-10-CM | POA: Diagnosis present

## 2015-07-24 DIAGNOSIS — Z79899 Other long term (current) drug therapy: Secondary | ICD-10-CM | POA: Diagnosis not present

## 2015-07-24 LAB — BASIC METABOLIC PANEL
ANION GAP: 7 (ref 5–15)
BUN: 17 mg/dL (ref 6–20)
CALCIUM: 9 mg/dL (ref 8.9–10.3)
CHLORIDE: 103 mmol/L (ref 101–111)
CO2: 29 mmol/L (ref 22–32)
Creatinine, Ser: 1.6 mg/dL — ABNORMAL HIGH (ref 0.44–1.00)
GFR calc Af Amer: 35 mL/min — ABNORMAL LOW (ref >60)
GFR calc non Af Amer: 30 mL/min — ABNORMAL LOW (ref >60)
Glucose, Bld: 116 mg/dL — ABNORMAL HIGH (ref 65–99)
Potassium: 3.1 mmol/L — ABNORMAL LOW (ref 3.5–5.1)
Sodium: 139 mmol/L (ref 135–145)

## 2015-07-24 LAB — CBC
HEMATOCRIT: 31.3 % — AB (ref 35.0–47.0)
Hemoglobin: 10.9 g/dL — ABNORMAL LOW (ref 12.0–16.0)
MCH: 31.5 pg (ref 26.0–34.0)
MCHC: 34.9 g/dL (ref 32.0–36.0)
MCV: 90.3 fL (ref 80.0–100.0)
Platelets: 122 10*3/uL — ABNORMAL LOW (ref 150–440)
RBC: 3.47 MIL/uL — ABNORMAL LOW (ref 3.80–5.20)
RDW: 14.3 % (ref 11.5–14.5)
WBC: 3.6 10*3/uL (ref 3.6–11.0)

## 2015-07-24 LAB — GLUCOSE, CAPILLARY: Glucose-Capillary: 139 mg/dL — ABNORMAL HIGH (ref 65–99)

## 2015-07-24 LAB — CK: Total CK: 1143 U/L — ABNORMAL HIGH (ref 38–234)

## 2015-07-24 LAB — TROPONIN I
Troponin I: <0.03 ng/mL (ref <0.031)
Troponin I: <0.03 ng/mL (ref <0.031)
Troponin I: <0.03 ng/mL (ref <0.031)

## 2015-07-24 MED ORDER — HYDRALAZINE HCL 20 MG/ML IJ SOLN
10.0000 mg | Freq: Once | INTRAMUSCULAR | Status: AC
  Administered 2015-07-24: 10 mg via INTRAVENOUS
  Filled 2015-07-24: qty 1

## 2015-07-24 MED ORDER — ALPRAZOLAM 0.5 MG PO TABS
0.5000 mg | ORAL_TABLET | Freq: Two times a day (BID) | ORAL | Status: DC | PRN
  Filled 2015-07-24 (×3): qty 1

## 2015-07-24 MED ORDER — CLONIDINE HCL 0.1 MG PO TABS
0.1000 mg | ORAL_TABLET | Freq: Every day | ORAL | Status: DC | PRN
  Administered 2015-07-25 – 2015-07-27 (×3): 0.1 mg via ORAL
  Filled 2015-07-24 (×3): qty 1

## 2015-07-24 MED ORDER — ALLOPURINOL 100 MG PO TABS: 300.0000 mg | ORAL_TABLET | Freq: Every day | ORAL | Status: DC | PRN

## 2015-07-24 MED ORDER — CARVEDILOL 12.5 MG PO TABS
25.0000 mg | ORAL_TABLET | Freq: Two times a day (BID) | ORAL | Status: DC
  Administered 2015-07-24 – 2015-07-28 (×8): 25 mg via ORAL
  Filled 2015-07-24 (×9): qty 2

## 2015-07-24 MED ORDER — SODIUM CHLORIDE 0.9% FLUSH
3.0000 mL | Freq: Two times a day (BID) | INTRAVENOUS | Status: DC
  Administered 2015-07-24 – 2015-07-28 (×4): 3 mL via INTRAVENOUS

## 2015-07-24 MED ORDER — ROSUVASTATIN CALCIUM 10 MG PO TABS
20.0000 mg | ORAL_TABLET | Freq: Every day | ORAL | Status: DC
  Administered 2015-07-24: 20 mg via ORAL
  Filled 2015-07-24: qty 1

## 2015-07-24 MED ORDER — POTASSIUM CHLORIDE CRYS ER 20 MEQ PO TBCR: 40.0000 meq | EXTENDED_RELEASE_TABLET | Freq: Once | ORAL | Status: DC

## 2015-07-24 MED ORDER — TRAZODONE HCL 50 MG PO TABS
50.0000 mg | ORAL_TABLET | Freq: Every day | ORAL | Status: DC
  Administered 2015-07-24 – 2015-07-27 (×4): 50 mg via ORAL
  Filled 2015-07-24 (×4): qty 1

## 2015-07-24 MED ORDER — BISACODYL 5 MG PO TBEC: 5.0000 mg | DELAYED_RELEASE_TABLET | Freq: Every day | ORAL | Status: DC | PRN

## 2015-07-24 MED ORDER — LEVOTHYROXINE SODIUM 50 MCG PO TABS
50.0000 ug | ORAL_TABLET | Freq: Every day | ORAL | Status: DC
  Administered 2015-07-24 – 2015-07-27 (×3): 50 ug via ORAL
  Filled 2015-07-24 (×4): qty 1

## 2015-07-24 MED ORDER — SODIUM CHLORIDE 0.9 % IV SOLN
INTRAVENOUS | Status: DC
  Administered 2015-07-24: 02:00:00 via INTRAVENOUS

## 2015-07-24 MED ORDER — RISPERIDONE 1 MG PO TABS
3.0000 mg | ORAL_TABLET | Freq: Every day | ORAL | Status: DC
Start: 2015-07-24 — End: 2015-07-28
  Administered 2015-07-24 – 2015-07-27 (×4): 3 mg via ORAL
  Filled 2015-07-24 (×5): qty 3

## 2015-07-24 MED ORDER — ASPIRIN EC 81 MG PO TBEC
81.0000 mg | DELAYED_RELEASE_TABLET | Freq: Every day | ORAL | Status: DC
  Administered 2015-07-24 – 2015-07-27 (×3): 81 mg via ORAL
  Filled 2015-07-24 (×4): qty 1

## 2015-07-24 MED ORDER — POTASSIUM CHLORIDE 20 MEQ PO PACK
40.0000 meq | PACK | Freq: Two times a day (BID) | ORAL | Status: DC
  Administered 2015-07-24: 40 meq via ORAL
  Filled 2015-07-24: qty 2

## 2015-07-24 MED ORDER — PANTOPRAZOLE SODIUM 40 MG PO TBEC
40.0000 mg | DELAYED_RELEASE_TABLET | Freq: Every day | ORAL | Status: DC
Start: 2015-07-24 — End: 2015-07-28
  Administered 2015-07-24 – 2015-07-27 (×3): 40 mg via ORAL
  Filled 2015-07-24 (×4): qty 1

## 2015-07-24 MED ORDER — ENOXAPARIN SODIUM 40 MG/0.4ML ~~LOC~~ SOLN
40.0000 mg | Freq: Every day | SUBCUTANEOUS | Status: DC
  Administered 2015-07-24 – 2015-07-27 (×5): 40 mg via SUBCUTANEOUS
  Filled 2015-07-24 (×5): qty 0.4

## 2015-07-24 MED ORDER — HYDRALAZINE HCL 20 MG/ML IJ SOLN
10.0000 mg | Freq: Four times a day (QID) | INTRAMUSCULAR | Status: DC | PRN
  Administered 2015-07-24 – 2015-07-25 (×3): 10 mg via INTRAVENOUS
  Filled 2015-07-24 (×6): qty 1

## 2015-07-24 MED ORDER — MEMANTINE HCL 5 MG PO TABS
5.0000 mg | ORAL_TABLET | Freq: Every day | ORAL | Status: DC
  Administered 2015-07-24 – 2015-07-27 (×3): 5 mg via ORAL
  Filled 2015-07-24 (×4): qty 1

## 2015-07-24 MED ORDER — MODAFINIL 100 MG PO TABS
100.0000 mg | ORAL_TABLET | Freq: Every day | ORAL | Status: DC
  Administered 2015-07-24 – 2015-07-26 (×2): 100 mg via ORAL
  Filled 2015-07-24 (×2): qty 1

## 2015-07-24 MED ORDER — POTASSIUM CHLORIDE IN NACL 40-0.9 MEQ/L-% IV SOLN
INTRAVENOUS | Status: AC
  Administered 2015-07-24: 75 mL/h via INTRAVENOUS
  Filled 2015-07-24: qty 1000

## 2015-07-24 MED ORDER — DIVALPROEX SODIUM 500 MG PO DR TAB
500.0000 mg | DELAYED_RELEASE_TABLET | Freq: Two times a day (BID) | ORAL | Status: DC
  Administered 2015-07-24 – 2015-07-27 (×7): 500 mg via ORAL
  Filled 2015-07-24 (×9): qty 1

## 2015-07-24 MED ORDER — ONDANSETRON HCL 4 MG/2ML IJ SOLN: 4.0000 mg | Freq: Four times a day (QID) | INTRAMUSCULAR | Status: DC | PRN

## 2015-07-24 MED ORDER — ONDANSETRON HCL 4 MG PO TABS: 4.0000 mg | ORAL_TABLET | Freq: Four times a day (QID) | ORAL | Status: DC | PRN

## 2015-07-24 NOTE — Progress Notes (Signed)
Pt's BP below Filed Vitals:   07/24/15 0952  BP: 205/46  Pulse:   Temp:   Resp:    Dr. Allena KatzPatel rounding and made aware, to place orders for IV PRN hydralazine.

## 2015-07-24 NOTE — Progress Notes (Signed)
Pt admitted to room 251. Pt unable to complete admission questions as she is disoriented to place and situation. VSS, no acute distress noted. Pt oriented to unit, room, and call bell. Instructed to call nursing before attempting to get out of bed. Skin and telemetry verified with Serina CowperAlisa, RN. RN will continue to monitor and treat per MD orders. [name] Overmanassie A Brayson Livesey, RN

## 2015-07-24 NOTE — Clinical Social Work Note (Signed)
Clinical Social Work Assessment  Patient Details  Name: [name] MRN: [id number] Date of Birth: 24-May-1935  Date of referral:  07/24/15               Reason for consult:  Facility Placement                Permission sought to share information with:    Permission granted to share information::     Name::        Agency::     Relationship::     Contact Information:     Housing/Transportation Living arrangements for the past 2 months:  Single Family Home Source of Information:  Adult Children Patient Interpreter Needed:  None Criminal Activity/Legal Involvement Pertinent to Current Situation/Hospitalization:  No - Comment as needed Significant Relationships:  Adult Children Lives with:  Self Do you feel safe going back to the place where you live?  Yes Need for family participation in patient care:  Yes (Comment)  Care giving concerns:  Patient lives alone at home.   Social Worker assessment / plan:  Patient transferred from 2A today and patient's daughter: [name] LeavenCoretta [name]: [phone] came to speak with CSW at desk and asked if patient would be able to go to STR. Patient's daughter owns a family care home and is familiar with the process for STR as well with her clients. CSW explained that PT had assessed today and recommended rehab. Patient's daughter does not want [facility]. Bedsearch initiated.   Employment status:  Retired Database administratornsurance information:  Managed Medicare PT Recommendations:  Skilled Nursing Facility Information / Referral to community resources:     Patient/Family's Response to care:  Patient's daughter appreciative of CSW assistance.  Patient/Family's Understanding of and Emotional Response to Diagnosis, Current Treatment, and Prognosis: Patient's daughter aware that patient will need rehab at discharge and is in agreement.  Emotional Assessment Appearance:  Appears stated age Attitude/Demeanor/Rapport:   (pleasant but confused and unable to carry on a  conversation with CSW at this time) Affect (typically observed):  Unable to Assess Orientation:  Oriented to Self Alcohol / Substance use:  Not Applicable Psych involvement (Current and /or in the community):  No (Comment)  Discharge Needs  Concerns to be addressed:  Care Coordination Readmission within the last 30 days:  No Current discharge risk:  None Barriers to Discharge:  No Barriers Identified   York SpanielMonica Onofre Gains, LCSW 07/24/2015, 5:08 PM

## 2015-07-24 NOTE — H&P (Signed)
[facility] - [place] at [facility]   PATIENT NAME: [name] LoboMertis [name]    MR#:  [id number]  DATE OF BIRTH:  01/02/1936  DATE OF ADMISSION:  07/23/2015  PRIMARY CARE PHYSICIAN: Sherlene ShamsULLO, TERESA L, MD   REQUESTING/REFERRING PHYSICIAN:   CHIEF COMPLAINT:   Chief Complaint  Patient presents with  . Weakness    HISTORY OF PRESENT ILLNESS: [name]  is a 80 y.o. female with a known history of Diabetes mellitus2, GERD, hypertension, anemia, arthritis, congestive heart failure presented to the emergency room with generalized weakness and confusion. Patient lives alone and has caregivers coming in the day. Patient was found on the floor today by family members. They do not exactly know whether she passed out. Patient started great historian. She is lethargic and confused. She was worked up with a CT head which showed no acute intracranial abnormality. Her CK level was high. Patient is awake and responds to verbal commands in the emergency room. She has generalized soreness. No history of any fever or chills. No history of any head injury according to family members.  PAST MEDICAL HISTORY:   Past Medical History  Diagnosis Date  . Diabetes mellitus   . GERD (gastroesophageal reflux disease)   . Hypertension   . Anemia   . Arthritis   . Hemorrhoids   . Hypertension   . CHF (congestive heart failure) (HCC)   . Renal disorder     PAST SURGICAL HISTORY: Past Surgical History  Procedure Laterality Date  . Cholecystectomy    . Abdominal hysterectomy    . Back surgery    . Arthroplasty w/ arthroscopy medial / lateral compartment knee  Feb 2011    [name]  . Hemorrhoid surgery  12/19/12    SOCIAL HISTORY:  Social History  Substance Use Topics  . Smoking status: Former Smoker -- 30 years    Types: Cigarettes    Quit date: 09/28/2001  . Smokeless tobacco: Current User    Types: Snuff  . Alcohol Use: No    FAMILY HISTORY:  Family History  Problem Relation Age  of Onset  . Heart disease Mother   . Hypertension Mother   . Cancer Mother     DRUG ALLERGIES:  Allergies  Allergen Reactions  . Hydrocodone-Acetaminophen Nausea Only    Other reaction(s): Dizziness  . Morphine And Related Other (See Comments)    Makes patient feel crazy    REVIEW OF SYSTEMS:  Could not be obtained as patient is confused. MEDICATIONS AT HOME:  Prior to Admission medications   Medication Sig Start Date End Date Taking? Authorizing Provider  allopurinol (ZYLOPRIM) 300 MG tablet Take 300 mg by mouth daily as needed (gout).  06/04/15 06/03/16 Yes Historical Provider, MD  ALPRAZolam Prudy Feeler(XANAX) 0.5 MG tablet Take 0.5 mg by mouth 2 (two) times daily as needed for anxiety.   Yes Historical Provider, MD  aspirin EC 81 MG tablet Take 81 mg by mouth daily.   Yes Historical Provider, MD  carvedilol (COREG) 25 MG tablet Take 1 tablet (25 mg total) by mouth 2 (two) times daily with a meal. 07/15/15  Yes Sherlene Shamseresa L Tullo, MD  cephALEXin (KEFLEX) 250 MG capsule Take 1 capsule (250 mg total) by mouth 2 (two) times daily. 07/21/15  Yes Tommie SamsJayce G Cook, DO  cloNIDine (CATAPRES) 0.1 MG tablet Take 1 tablet (0.1 mg total) by mouth as needed. Patient taking differently: Take 0.1 mg by mouth daily as needed (high blood pressure).  02/04/15  Yes  Sherlene Shams, MD  divalproex (DEPAKOTE) 500 MG DR tablet Take by mouth 2 (two) times daily.  06/04/15 06/03/16 Yes Historical Provider, MD  furosemide (LASIX) 40 MG tablet Take 40 mg by mouth 2 (two) times daily.   Yes Historical Provider, MD  insulin lispro (HUMALOG) 100 UNIT/ML injection Inject 10 Units into the skin 3 (three) times daily with meals.   Yes Historical Provider, MD  levothyroxine (SYNTHROID, LEVOTHROID) 50 MCG tablet Take 1 tablet (50 mcg total) by mouth daily before breakfast. 06/23/15  Yes Sherlene Shams, MD  LINZESS 145 MCG CAPS capsule Take 1 capsule by mouth daily as needed (constipation).  06/04/15  Yes Historical Provider, MD  memantine  (NAMENDA) 5 MG tablet Take 5 mg by mouth daily.  06/04/15 06/03/16 Yes Historical Provider, MD  omeprazole (PRILOSEC) 40 MG capsule Take 1 capsule (40 mg total) by mouth daily. 05/15/15 05/14/16 Yes Anne-Caroline Sharma Covert, MD  potassium chloride SA (K-DUR,KLOR-CON) 20 MEQ tablet Take 1 tablet (20 mEq total) by mouth daily. For 5 days 07/21/15  Yes Sherlene Shams, MD  risperiDONE (RISPERDAL) 3 MG tablet Take 3 mg by mouth at bedtime.   Yes Historical Provider, MD  rosuvastatin (CRESTOR) 20 MG tablet Take 1 tablet (20 mg total) by mouth daily. 04/02/15  Yes Sherlene Shams, MD  traZODone (DESYREL) 50 MG tablet Take 50 mg by mouth at bedtime.  06/04/15 07/23/15 Yes Historical Provider, MD  insulin aspart protamine - aspart (NOVOLOG 70/30 MIX) (70-30) 100 UNIT/ML FlexPen 20 units in the morning and 10 units  in the evening 06/18/15   Sherlene Shams, MD      PHYSICAL EXAMINATION:   VITAL SIGNS: Blood pressure 180/55, pulse 73, temperature 98.2 F (36.8 C), temperature source Oral, resp. rate 19, height  (1.702 m), weight 85.73 kg (189 lb), SpO2 100 %.  GENERAL:  80 y.o.-year-old patient lying in the bed with no acute distress.  EYES: Pupils equal, round, reactive to light and accommodation. No scleral icterus. Extraocular muscles intact.  HEENT: Head atraumatic, normocephalic. Oropharynx dry and nasopharynx clear.  NECK:  Supple, no jugular venous distention. No thyroid enlargement, no tenderness.  LUNGS: Normal breath sounds bilaterally, no wheezing, rales,rhonchi or crepitation. No use of accessory muscles of respiration.  CARDIOVASCULAR: S1, S2 normal. No murmurs, rubs, or gallops.  ABDOMEN: Soft, nontender, nondistended. Bowel sounds present. No organomegaly or mass.  EXTREMITIES: No pedal edema, cyanosis, or clubbing.  NEUROLOGIC: Cranial nerves II through XII are intact. Moves all extremities.No cerebellar signs noted. PSYCHIATRIC: could not be assessed SKIN: No obvious rash, lesion, or ulcer.    LABORATORY PANEL:   CBC  Recent Labs Lab 07/23/15 1849  WBC 4.0  HGB 12.0  HCT 33.9*  PLT 140*  MCV 91.2  MCH 32.2  MCHC 35.3  RDW 14.0   ------------------------------------------------------------------------------------------------------------------  Chemistries   Recent Labs Lab 07/17/15 1637 07/23/15 1849  NA 144 138  K 3.3* 3.1*  CL 99 97*  CO2 37* 33*  GLUCOSE 123* 166*  BUN 20 17  CREATININE 1.77* 1.71*  CALCIUM 9.9 9.5  AST 21  --   ALT 14  --   ALKPHOS 77  --   BILITOT 0.3  --    ------------------------------------------------------------------------------------------------------------------ estimated creatinine clearance is 31 mL/min (by C-G formula based on Cr of 1.71). ------------------------------------------------------------------------------------------------------------------ No results for input(s): TSH, T4TOTAL, T3FREE, THYROIDAB in the last 72 hours.  Invalid input(s): FREET3   Coagulation profile No results for input(s): INR, PROTIME  in the last 168 hours. ------------------------------------------------------------------------------------------------------------------- No results for input(s): DDIMER in the last 72 hours. -------------------------------------------------------------------------------------------------------------------  Cardiac Enzymes No results for input(s): CKMB, TROPONINI, MYOGLOBIN in the last 168 hours.  Invalid input(s): CK ------------------------------------------------------------------------------------------------------------------ Invalid input(s): POCBNP  ---------------------------------------------------------------------------------------------------------------  Urinalysis    Component Value Date/Time   COLORURINE STRAW* 07/23/2015 2041   COLORURINE Straw 01/06/2014 1651   APPEARANCEUR CLEAR* 07/23/2015 2041   APPEARANCEUR Hazy 01/06/2014 1651   LABSPEC 1.009 07/23/2015 2041   LABSPEC  1.006 01/06/2014 1651   PHURINE 7.0 07/23/2015 2041   PHURINE 5.0 01/06/2014 1651   GLUCOSEU NEGATIVE 07/23/2015 2041   GLUCOSEU NEGATIVE 05/07/2015 0921   GLUCOSEU 50 mg/dL 16/01/9603 5409   HGBUR 2+* 07/23/2015 2041   HGBUR 1+ 01/06/2014 1651   BILIRUBINUR NEGATIVE 07/23/2015 2041   BILIRUBINUR neg 07/17/2015 1610   BILIRUBINUR Negative 01/06/2014 1651   KETONESUR NEGATIVE 07/23/2015 2041   KETONESUR Negative 01/06/2014 1651   PROTEINUR 100* 07/23/2015 2041   PROTEINUR >=300 07/17/2015 1610   PROTEINUR 100 mg/dL 81/19/1478 2956   UROBILINOGEN 0.2 07/17/2015 1610   UROBILINOGEN 0.2 05/07/2015 0921   NITRITE NEGATIVE 07/23/2015 2041   NITRITE neg 07/17/2015 1610   NITRITE Negative 01/06/2014 1651   LEUKOCYTESUR NEGATIVE 07/23/2015 2041   LEUKOCYTESUR 1+ 01/06/2014 1651     RADIOLOGY: Dg Pelvis 1-2 Views  07/23/2015  CLINICAL DATA:  Fall EXAM: PELVIS - 1-2 VIEW COMPARISON:  None. FINDINGS: There is no evidence of pelvic fracture or diastasis. Suboptimal study due to patient's large body habitus. Mild degenerative changes bilateral hip joints with mild superior acetabular spurring. IMPRESSION: No acute fracture or subluxation. Mild degenerative changes bilateral hip joints. Electronically Signed   By: Natasha Mead M.D.   On: 07/23/2015 20:25   Ct Head Wo Contrast  07/23/2015  CLINICAL DATA:  Fall. EXAM: CT HEAD WITHOUT CONTRAST TECHNIQUE: Contiguous axial images were obtained from the base of the skull through the vertex without intravenous contrast. COMPARISON:  01/06/2014 head CT. FINDINGS: No evidence of parenchymal hemorrhage or extra-axial fluid collection. No mass lesion, mass effect, or midline shift. No CT evidence of acute infarction. Intracranial atherosclerosis. Mild diffuse cerebral volume loss. Nonspecific mild subcortical and periventricular white matter hypodensity, most in keeping with chronic small vessel ischemic change. No ventriculomegaly. The visualized paranasal  sinuses are essentially clear. The mastoid air cells are unopacified. No evidence of calvarial fracture. IMPRESSION: 1. No evidence of acute intracranial abnormality. No calvarial fracture . 2. Mild cerebral volume loss and mild chronic small vessel ischemia. Electronically Signed   By: Delbert Phenix M.D.   On: 07/23/2015 19:20    EKG: Orders placed or performed during the hospital encounter of 07/23/15  . EKG 12-Lead  . EKG 12-Lead  . ED EKG  . ED EKG    IMPRESSION AND PLAN: 80 year old female patient with history of for diabetes mellitus, hypertension, CHF presented to the emergency room with confusion and lethargy. Admitting diagnosis 1. Altered mental status 2. Hypokalemia 3. Dehydration 4. Acute rhabdomyolysis 5. Syncope versus fall Treatment plan Admit patient to telemetry IV fluid hydration Hold diuretics Replace potassium Follow-up CK level Check troponin to rule out ischemia Supportive care.  All the records are reviewed and case discussed with ED provider. Management plans discussed with the patient, family and they are in agreement.  CODE STATUS:FULL Code Status History    Date Active Date Inactive Code Status Order ID Comments User Context   09/03/2014  1:01 PM 09/05/2014  6:09 PM Full Code  [id number]  Enedina Finner, MD Inpatient       TOTAL TIME TAKING CARE OF THIS PATIENT: 50 minutes.    Ihor Austin M.D on 07/24/2015 at 12:25 AM  Between 7am to 6pm - Pager - [phone]  After 6pm go to [email or web address] - password [email or web address] [facility]  [facility]  Office  [phone]  CC: Primary care physician; Sherlene Shams, MD

## 2015-07-24 NOTE — Progress Notes (Addendum)
[facility] - [place] at [facility]                                                                                                                                                                                            Patient Demographics   [name], is a 80 y.o. female, DOB - [birth year], FAO:[id number]  Admit date - 07/23/2015   Admitting Physician Ihor Austin, MD  Outpatient Primary MD for the patient is Sherlene Shams, MD   LOS - 0  Subjective: Patient was admited  with this fall noted to have rhabdomyolysis she is currently confused a little sleepy. Denies any complaints     Review of Systems:   CONSTITUTIONAL: No documented fever. No fatigue, weakness. No weight gain, no weight loss.  EYES: No blurry or double vision.  ENT: No tinnitus. No postnasal drip. No redness of the oropharynx.  RESPIRATORY: No cough, no wheeze, no hemoptysis. No dyspnea.  CARDIOVASCULAR: No chest pain. No orthopnea. No palpitations. No syncope.  GASTROINTESTINAL: No nausea, no vomiting or diarrhea. No abdominal pain. No melena or hematochezia.  GENITOURINARY: No dysuria or hematuria.  ENDOCRINE: No polyuria or nocturia. No heat or cold intolerance.  HEMATOLOGY: No anemia. No bruising. No bleeding.  INTEGUMENTARY: No rashes. No lesions.  MUSCULOSKELETAL: No arthritis. No swelling. No gout.  NEUROLOGIC: No numbness, tingling, or ataxia. No seizure-type activity.  PSYCHIATRIC: No anxiety. No insomnia. No ADD.    Vitals:   Filed Vitals:   07/24/15 1057 07/24/15 1129 07/24/15 1225 07/24/15 1228  BP: 140/38 146/38 153/35 150/58  Pulse: 79 80 83   Temp:  98.2 F (36.8 C) 98.3 F (36.8 C)   TempSrc:  Oral Oral   Resp:  18    Height:      Weight:      SpO2:  95% 99%     Wt Readings from Last 3 Encounters:  07/24/15 84.596 kg (186 lb 8 oz)  07/17/15 85.957 kg (189 lb 8 oz)  06/18/15 86.728 kg (191 lb 3.2 oz)     Intake/Output Summary (Last 24 hours) at  07/24/15 1435 Last data filed at 07/24/15 0245  Gross per 24 hour  Intake      0 ml  Output    500 ml  Net   -500 ml    Physical Exam:   GENERAL: Pleasant-appearing in no apparent distress.  HEAD, EYES, EARS, NOSE AND THROAT: Atraumatic, normocephalic. Extraocular muscles are intact. Pupils equal and reactive to light. Sclerae anicteric. No conjunctival injection. No oro-pharyngeal erythema.  NECK: Supple. There is no jugular venous distention. No bruits, no  lymphadenopathy, no thyromegaly.  HEART: Regular rate and rhythm,. No murmurs, no rubs, no clicks.  LUNGS: Clear to auscultation bilaterally. No rales or rhonchi. No wheezes.  ABDOMEN: Soft, flat, nontender, nondistended. Has good bowel sounds. No hepatosplenomegaly appreciated.  EXTREMITIES: No evidence of any cyanosis, clubbing, or peripheral edema.  +2 pedal and radial pulses bilaterally.  NEUROLOGIC: The patient is a little sleepy but able to answer questions not oriented to place or time with no focal motor or sensory deficits appreciated bilaterally.  SKIN: Moist and warm with no rashes appreciated.  Psych: Not anxious, depressed LN: No inguinal LN enlargement    Antibiotics   Anti-infectives    None      Medications   Scheduled Meds: . aspirin EC  81 mg Oral Daily  . carvedilol  25 mg Oral BID WC  . divalproex  500 mg Oral Q12H  . enoxaparin (LOVENOX) injection  40 mg Subcutaneous QHS  . levothyroxine  50 mcg Oral QAC breakfast  . memantine  5 mg Oral Daily  . modafinil  100 mg Oral Daily  . pantoprazole  40 mg Oral QAC breakfast  . risperiDONE  3 mg Oral QHS  . sodium chloride flush  3 mL Intravenous Q12H  . traZODone  50 mg Oral QHS   Continuous Infusions: . 0.9 % NaCl with KCl 40 mEq / L 75 mL/hr (07/24/15 1023)   PRN Meds:.allopurinol, ALPRAZolam, bisacodyl, cloNIDine, hydrALAZINE, ondansetron **OR** ondansetron (ZOFRAN) IV   Data Review:   Micro Results Recent Results (from the past 240 hour(s))   Urine culture     Status: None   Collection Time: 07/17/15  4:16 PM  Result Value Ref Range Status   Culture ESCHERICHIA COLI  Final   Colony Count >=100,000 COLONIES/ML  Final   Organism ID, Bacteria ESCHERICHIA COLI  Final      Susceptibility   Escherichia coli -  (no method available)    CEFAZOLIN 25 Sensitive     AMPICILLIN >=32 Resistant     AMOX/CLAVULANIC 16 Intermediate     AMPICILLIN/SULBACTAM >=32 Resistant     PIP/TAZO <=4 Sensitive     IMIPENEM <=0.25 Sensitive     CEFTRIAXONE <=1 Sensitive     CEFTAZIDIME <=1 Sensitive     CEFEPIME <=1 Sensitive     GENTAMICIN <=1 Sensitive     TOBRAMYCIN <=1 Sensitive     CIPROFLOXACIN 1 Sensitive     LEVOFLOXACIN 1 Sensitive     NITROFURANTOIN <=16 Sensitive     TRIMETH/SULFA* <=20 Sensitive      * NR=NOT REPORTABLE,SEE COMMENTORAL therapy:A cefazolin MIC of <32 predicts susceptibility to the oral agents cefaclor,cefdinir,cefpodoxime,cefprozil,cefuroxime,cephalexin,and loracarbef when used for therapy of uncomplicated UTIs due to E.coli,K.pneumomiae,and P.mirabilis. PARENTERAL therapy: A cefazolinMIC of >8 indicates resistance to parenteralcefazolin. An alternate test method must beperformed to confirm susceptibility to parenteralcefazolin.    Radiology Reports Dg Pelvis 1-2 Views  07/23/2015  CLINICAL DATA:  Fall EXAM: PELVIS - 1-2 VIEW COMPARISON:  None. FINDINGS: There is no evidence of pelvic fracture or diastasis. Suboptimal study due to patient's large body habitus. Mild degenerative changes bilateral hip joints with mild superior acetabular spurring. IMPRESSION: No acute fracture or subluxation. Mild degenerative changes bilateral hip joints. Electronically Signed   By: Natasha MeadLiviu  Pop M.D.   On: 07/23/2015 20:25   Ct Head Wo Contrast  07/23/2015  CLINICAL DATA:  Fall. EXAM: CT HEAD WITHOUT CONTRAST TECHNIQUE: Contiguous axial images were obtained from the base of the skull through the vertex  without intravenous contrast. COMPARISON:   01/06/2014 head CT. FINDINGS: No evidence of parenchymal hemorrhage or extra-axial fluid collection. No mass lesion, mass effect, or midline shift. No CT evidence of acute infarction. Intracranial atherosclerosis. Mild diffuse cerebral volume loss. Nonspecific mild subcortical and periventricular white matter hypodensity, most in keeping with chronic small vessel ischemic change. No ventriculomegaly. The visualized paranasal sinuses are essentially clear. The mastoid air cells are unopacified. No evidence of calvarial fracture. IMPRESSION: 1. No evidence of acute intracranial abnormality. No calvarial fracture . 2. Mild cerebral volume loss and mild chronic small vessel ischemia. Electronically Signed   By: Delbert Phenix M.D.   On: 07/23/2015 19:20     CBC  Recent Labs Lab 07/23/15 1849 07/24/15 0639  WBC 4.0 3.6  HGB 12.0 10.9*  HCT 33.9* 31.3*  PLT 140* 122*  MCV 91.2 90.3  MCH 32.2 31.5  MCHC 35.3 34.9  RDW 14.0 14.3    Chemistries   Recent Labs Lab 07/17/15 1637 07/23/15 1849 07/24/15 0639  NA 144 138 139  K 3.3* 3.1* 3.1*  CL 99 97* 103  CO2 37* 33* 29  GLUCOSE 123* 166* 116*  BUN CREATININE 1.77* 1.71* 1.60*  CALCIUM 9.9 9.5 9.0  AST 21  --   --   ALT 14  --   --   ALKPHOS 77  --   --   BILITOT 0.3  --   --    ------------------------------------------------------------------------------------------------------------------ estimated creatinine clearance is 32.9 mL/min (by C-G formula based on Cr of 1.6). ------------------------------------------------------------------------------------------------------------------ No results for input(s): HGBA1C in the last 72 hours. ------------------------------------------------------------------------------------------------------------------ No results for input(s): CHOL, HDL, LDLCALC, TRIG, CHOLHDL, LDLDIRECT in the last 72  hours. ------------------------------------------------------------------------------------------------------------------ No results for input(s): TSH, T4TOTAL, T3FREE, THYROIDAB in the last 72 hours.  Invalid input(s): FREET3 ------------------------------------------------------------------------------------------------------------------ No results for input(s): VITAMINB12, FOLATE, FERRITIN, TIBC, IRON, RETICCTPCT in the last 72 hours.  Coagulation profile No results for input(s): INR, PROTIME in the last 168 hours.  No results for input(s): DDIMER in the last 72 hours.  Cardiac Enzymes  Recent Labs Lab 07/24/15 0049 07/24/15 0639 07/24/15 1235  TROPONINI <0.03 <0.03 <0.03   ------------------------------------------------------------------------------------------------------------------ Invalid input(s): POCBNP    Assessment & Plan   80 year old female patient with history of for diabetes mellitus, hypertension, CHF presented to the emergency room with confusion and lethargy. Admitting diagnosis 1. Acute encephalopathy could be related to worsening dementia- at this time with the rhabdo will give her IV fluids monitor renal function I will start patient on Provigil in the morning due to daytime drowsiness 2. Hypokalemia I will add potassium to her IV fluids 3. Dehydration 4. Acute rhabdomyolysis IV fluids monitor CPK 5. Likely fall no evidence of arrhythmia or any other cardiac symptoms to suggest syncope we'll try to ambulate patient 6. Hyperlipidemia we'll stop her cholesterol in lowering drug 7. Hypertension we'll resume her clonidine 8. Dementia we'll continue trazodone and Risperdal      Code Status Orders        Start     Ordered   07/24/15 0218  Full code   Continuous     07/24/15 0218    Code Status History    Date Active Date Inactive Code Status Order ID Comments User Context   09/03/2014  1:01 PM 09/05/2014  6:09 PM Full Code [id number]  Enedina Finner, MD  Inpatient           Consults none DVT Prophylaxis  Lovenox  Lab Results  Component Value Date   PLT 122* 07/24/2015     Time Spent in minutes  45 minutes  Greater than 50% of time spent in care coordination and counseling patient regarding the condition and plan of care.   Auburn Bilberry M.D on 07/24/2015 at 2:35 PM  Between 7am to 6pm - Pager - [phone]  After 6pm go to [email or web address] - password [email or web address] [facility]  [facility]   Office  [phone]

## 2015-07-24 NOTE — Progress Notes (Signed)
Patient is confused not aware of situation. Patient has orders to be transferred to 212. Report given. Daughter(coretta) is aware patient is being transferred. Patient was transported via bed by nursing staff.

## 2015-07-24 NOTE — NC FL2 (Signed)
MEDICAID FL2 LEVEL OF CARE SCREENING TOOL     IDENTIFICATION  Patient Name: [name] Birthdate: Jun 04, 1935 Sex: female Admission Date (Current Location): 07/23/2015  [place] and [place] Number:  Chiropodist and Address:  [facility], [address], [place], [place] [address]      Provider Number: [id number]  Attending Physician Name and Address:  Auburn Bilberry, MD  Relative Name and Phone Number:       Current Level of Care: Hospital Recommended Level of Care: Skilled Nursing Facility Prior Approval Number:    Date Approved/Denied:   PASRR Number:    Discharge Plan: SNF    Current Diagnoses: Patient Active Problem List   Diagnosis Date Noted  . Rhabdomyolysis 07/24/2015  . Altered mental status 07/18/2015  . Hospital discharge follow-up 06/21/2015  . Mixed Alzheimer's and vascular dementia 06/02/2015  . Schizoaffective disorder, bipolar type (HCC) 05/17/2015  . Psychosis 05/16/2015  . CKD (chronic kidney disease) stage 3, GFR 30-59 ml/min 05/03/2015  . Noncompliance with medication treatment due to overuse of medication 01/28/2015  . Hypothyroidism 01/28/2015  . Dysuria 08/07/2014  . Back pain of thoracolumbar region 09/23/2013  . Visit for preventive health examination 08/18/2013  . Obesity 07/07/2013  . Depression with anxiety 06/26/2013  . Hemorrhoids 11/06/2012  . Hidradenitis suppurativa 10/05/2012  . Venous insufficiency of leg 11/24/2011  . Type 2 diabetes, uncontrolled, with renal manifestation (HCC) 10/03/2011  . Anemia of chronic renal failure, stage 4 (severe) (HCC) 10/03/2011  . Chest pain with normal coronary angiography 04/21/2011  . Screening for colon cancer 04/12/2011  . Screening for breast cancer 04/12/2011  . Hypertension 04/12/2011  . Hyperlipidemia with target LDL less than 100 04/12/2011  . Screening for osteoporosis 04/12/2011    Orientation RESPIRATION BLADDER Height & Weight      Self  Normal Continent Weight: 186 lb 8 oz (84.596 kg) Height:   (170.2 cm)  BEHAVIORAL SYMPTOMS/MOOD NEUROLOGICAL BOWEL NUTRITION STATUS   (none)  (none) Continent Diet (heart healthy/carb modified)  AMBULATORY STATUS COMMUNICATION OF NEEDS Skin   Limited Assist Verbally Normal                       Personal Care Assistance Level of Assistance  Bathing, Dressing Bathing Assistance: Limited assistance   Dressing Assistance: Limited assistance     Functional Limitations Info             SPECIAL CARE FACTORS FREQUENCY  PT (By licensed PT)                    Contractures Contractures Info: Not present    Additional Factors Info  Allergies   Allergies Info: morphine;hydrocodone           Current Medications (07/24/2015):  This is the current hospital active medication list Current Facility-Administered Medications  Medication Dose Route Frequency Provider Last Rate Last Dose  . allopurinol (ZYLOPRIM) tablet 300 mg  300 mg Oral Daily PRN Ihor Austin, MD      . ALPRAZolam Prudy Feeler) tablet 0.5 mg  0.5 mg Oral BID PRN Ihor Austin, MD      . aspirin EC tablet 81 mg  81 mg Oral Daily Ihor Austin, MD   81 mg at 07/24/15 0936  . bisacodyl (DULCOLAX) EC tablet 5 mg  5 mg Oral Daily PRN Ihor Austin, MD      . carvedilol (COREG) tablet 25 mg  25 mg Oral BID WC  Ihor AustinPavan Pyreddy, MD   25 mg at 07/24/15 0936  . cloNIDine (CATAPRES) tablet 0.1 mg  0.1 mg Oral Daily PRN Auburn BilberryShreyang Patel, MD      . divalproex (DEPAKOTE) DR tablet 500 mg  500 mg Oral Q12H Ihor AustinPavan Pyreddy, MD   500 mg at 07/24/15 0936  . enoxaparin (LOVENOX) injection 40 mg  40 mg Subcutaneous QHS Pavan Pyreddy, MD   40 mg at 07/24/15 0231  . hydrALAZINE (APRESOLINE) injection 10 mg  10 mg Intravenous Q6H PRN Auburn BilberryShreyang Patel, MD   10 mg at 07/24/15 1023  . levothyroxine (SYNTHROID, LEVOTHROID) tablet 50 mcg  50 mcg Oral QAC breakfast Ihor AustinPavan Pyreddy, MD   50 mcg at 07/24/15 0936  . memantine (NAMENDA) tablet  5 mg  5 mg Oral Daily Ihor AustinPavan Pyreddy, MD   5 mg at 07/24/15 0936  . modafinil (PROVIGIL) tablet 100 mg  100 mg Oral Daily Auburn BilberryShreyang Patel, MD   100 mg at 07/24/15 1023  . ondansetron (ZOFRAN) tablet 4 mg  4 mg Oral Q6H PRN Ihor AustinPavan Pyreddy, MD       Or  . ondansetron (ZOFRAN) injection 4 mg  4 mg Intravenous Q6H PRN Pavan Pyreddy, MD      . pantoprazole (PROTONIX) EC tablet 40 mg  40 mg Oral QAC breakfast Ihor AustinPavan Pyreddy, MD   40 mg at 07/24/15 0936  . risperiDONE (RISPERDAL) tablet 3 mg  3 mg Oral QHS Pavan Pyreddy, MD      . sodium chloride flush (NS) 0.9 % injection 3 mL  3 mL Intravenous Q12H Pavan Pyreddy, MD   3 mL at 07/24/15 0230  . traZODone (DESYREL) tablet 50 mg  50 mg Oral QHS Ihor AustinPavan Pyreddy, MD         Discharge Medications: Please see discharge summary for a list of discharge medications.  Relevant Imaging Results:  Relevant Lab Results:   Additional Information SS: [id number]  York SpanielMonica Ronak Duquette, LCSW

## 2015-07-24 NOTE — Evaluation (Signed)
Physical Therapy Evaluation Patient Details Name: [name] LoboMertis Graley MRN: [id number] DOB: August 11, 1935 Today's Date: 07/24/2015   History of Present Illness  presented to ER secondary to generalized weakness, AMS after being found on floor for unknown period of time (syncope vs. fall?); admitted with rhabdomyolysis.  Clinical Impression  Upon evaluation, patient lethargic, but arousable; oriented to self only.  Bilat UE/LEs globally weak and deconditioned with strength generally only 3-/5 throughout.  Intermittent resting tremors noted bilat UEs; patient able to calm when directed.  Currently requiring mod/max assist +2 for bed mobility; mod/max assist +1 for unsupported sitting balance.  Frequent posterior trunk lean/weight shift with difficulty maintaining feet on floor at times; minimal/no attempts at spontaneous righting.  Very high risk for falls, even from seated position. Unsafe/unable to attempts additional OOB/mobility efforts at this time. Patient noted to be generally hypotensive during evaluation as well; no change in reading despite change of position (no orthostasis). RN informed/aware. Would benefit from skilled PT to address above deficits and promote optimal return to PLOF; recommend transition to STR upon discharge from acute hospitalization.  Patient unsafe/unable to return home and be alone for any period of time at this time.     Follow Up Recommendations SNF    Equipment Recommendations  Rolling walker with 5" wheels    Recommendations for Other Services       Precautions / Restrictions Precautions Precautions: Fall Precaution Comments: Bedrest with BR privildges, Seizure history Restrictions Weight Bearing Restrictions: No      Mobility  Bed Mobility Overal bed mobility: Needs Assistance Bed Mobility: Supine to Sit;Sit to Supine     Supine to sit: Max assist Sit to supine: Total assist;+2 for physical assistance      Transfers                 General  transfer comment: mod/max assist for unsupported sitting balance; unsafe/unable to attempt standing or OOB efforts at this time  Ambulation/Gait             General Gait Details: mod/max assist for unsupported sitting balance; unsafe/unable to attempt standing or OOB efforts at this time  Stairs            Wheelchair Mobility    Modified Rankin (Stroke Patients Only)       Balance Overall balance assessment: Needs assistance Sitting-balance support: No upper extremity supported;Feet supported Sitting balance-Leahy Scale: Poor Sitting balance - Comments: posterior trunk lean/weight shift, difficulty maintaining LEs on floor at times.  Little/no spontaneous righting.  Very high fall risk.       Standing balance comment: mod/max assist for unsupported sitting balance; unsafe/unable to attempt standing or OOB efforts at this time                             Pertinent Vitals/Pain Pain Assessment: No/denies pain    Home Living Family/patient expects to be discharged to:: Private residence Living Arrangements: Alone Available Help at Discharge: Personal care attendant Type of Home: Apartment Home Access: Level entry     Home Layout: One level Home Equipment: Cane - single point;Shower seat Additional Comments: Social history obtained from chart (previous admissions), as patient unable to provide.  Per chart, has caregivers during daytime hours; alone at night    Prior Function Level of Independence: Needs assistance         Comments: Patient unable to provide; will verify with family/caregivers as available  Hand Dominance        Extremity/Trunk Assessment   Upper Extremity Assessment: Generalized weakness (grossly 3-/5 throughout, occasional resting tremor noted bilat)           Lower Extremity Assessment: Generalized weakness (grossly 3-/5 throughout; globally weak and deconditioend)      Cervical / Trunk Assessment:  (poor trunk  control in unsupported sitting; frequent posterior LOB/weight shift with minimal/no attempts at spontaneous correction)  Communication   Communication: No difficulties (speech slurred and unintelligible at times)  Cognition Arousal/Alertness: Lethargic   Overall Cognitive Status: Difficult to assess (oriented to self only; unaware of situation, location; inconsistently follows simple commands)                      General Comments      Exercises Other Exercises Other Exercises: Attempted to work on sitting balance and awareness of midline orientation (esp in A/P plane).  PAtient intermittent able to correct to neutral with mod/max cuing from therapist, but unable to maintain without constant cuing and direct focus on task.  FAtigues quickly; difficulty following commands for more functional activities beyond static sitting.      Assessment/Plan    PT Assessment Patient needs continued PT services  PT Diagnosis Difficulty walking;Generalized weakness   PT Problem List Decreased strength;Decreased range of motion;Decreased activity tolerance;Decreased balance;Decreased mobility;Decreased coordination;Decreased cognition;Decreased knowledge of use of DME;Decreased safety awareness;Decreased knowledge of precautions  PT Treatment Interventions DME instruction;Gait training;Stair training;Functional mobility training;Therapeutic activities;Balance training;Therapeutic exercise;Patient/family education;Cognitive remediation   PT Goals (Current goals can be found in the Care Plan section) Acute Rehab PT Goals PT Goal Formulation: Patient unable to participate in goal setting Time For Goal Achievement: 08/07/15 Potential to Achieve Goals: Fair Additional Goals Additional Goal #1: Assess and establish goals for OOB/gait as appropriate.    Frequency Min 2X/week   Barriers to discharge Decreased caregiver support;Inaccessible home environment      Co-evaluation                End of Session Equipment Utilized During Treatment: Gait belt Activity Tolerance: Patient limited by lethargy Patient left: in bed;with call bell/phone within reach;with bed alarm set Nurse Communication: Mobility status         Time: 4098-1191 PT Time Calculation (min) (ACUTE ONLY): 16 min   Charges:   PT Evaluation $PT Eval Moderate Complexity: 1 Procedure PT Treatments $Therapeutic Activity: 8-22 mins   PT G Codes:        Takeria Marquina H. Manson Passey, PT, DPT, NCS 07/24/2015, 10:53 AM [phone]

## 2015-07-25 ENCOUNTER — Inpatient Hospital Stay
Admit: 2015-07-25 | Discharge: 2015-07-25 | Disposition: A | Discharge status: Home / Self Care | Payer: Medicare Other | Attending: Internal Medicine | Admitting: Internal Medicine

## 2015-07-25 LAB — CBC
HEMATOCRIT: 30.8 % — AB (ref 35.0–47.0)
HEMOGLOBIN: 10.8 g/dL — AB (ref 12.0–16.0)
MCH: 31.7 pg (ref 26.0–34.0)
MCHC: 35.2 g/dL (ref 32.0–36.0)
MCV: 90.2 fL (ref 80.0–100.0)
Platelets: 128 10*3/uL — ABNORMAL LOW (ref 150–440)
RBC: 3.41 MIL/uL — AB (ref 3.80–5.20)
RDW: 14.1 % (ref 11.5–14.5)
WBC: 2.9 10*3/uL — AB (ref 3.6–11.0)

## 2015-07-25 LAB — BASIC METABOLIC PANEL
Anion gap: 7 (ref 5–15)
BUN: 18 mg/dL (ref 6–20)
CHLORIDE: 107 mmol/L (ref 101–111)
CO2: 26 mmol/L (ref 22–32)
Calcium: 9.3 mg/dL (ref 8.9–10.3)
Creatinine, Ser: 1.47 mg/dL — ABNORMAL HIGH (ref 0.44–1.00)
GFR, EST AFRICAN AMERICAN: 39 mL/min — AB (ref >60)
GFR, EST NON AFRICAN AMERICAN: 33 mL/min — AB (ref >60)
Glucose, Bld: 116 mg/dL — ABNORMAL HIGH (ref 65–99)
POTASSIUM: 3.5 mmol/L (ref 3.5–5.1)
SODIUM: 140 mmol/L (ref 135–145)

## 2015-07-25 LAB — CK
CK TOTAL: 800 U/L — AB (ref 38–234)
CK TOTAL: 642 U/L — AB (ref 38–234)

## 2015-07-25 MED ORDER — LORAZEPAM 2 MG/ML IJ SOLN
1.0000 mg | Freq: Once | INTRAMUSCULAR | Status: AC
  Administered 2015-07-25: 1 mg via INTRAVENOUS
  Filled 2015-07-25: qty 0.5

## 2015-07-25 MED ORDER — IRBESARTAN 75 MG PO TABS
75.0000 mg | ORAL_TABLET | Freq: Every day | ORAL | Status: DC
  Administered 2015-07-26 – 2015-07-28 (×3): 75 mg via ORAL
  Filled 2015-07-25 (×3): qty 1

## 2015-07-25 MED ORDER — SODIUM CHLORIDE 0.9 % IV SOLN
INTRAVENOUS | Status: DC
  Administered 2015-07-25 – 2015-07-27 (×4): via INTRAVENOUS

## 2015-07-25 MED ORDER — HYDRALAZINE HCL 20 MG/ML IJ SOLN
10.0000 mg | INTRAMUSCULAR | Status: AC
  Administered 2015-07-25: 10 mg via INTRAVENOUS

## 2015-07-25 NOTE — Progress Notes (Signed)
[facility] - [place] at [facility]                                                                                                                                                                                            Patient Demographics   [facility], is a 80 y.o. female, DOB - 04/24/1936, ZOX:[id number]  Admit date - 07/23/2015   Admitting Physician Ihor Austin, MD  Outpatient Primary MD for the patient is Sherlene Shams, MD   LOS - 1  Subjective: Patient is little more awake. Asking for lunch. Her CPKs trending down    Review of Systems:   CONSTITUTIONAL: Limited due to confusion  Vitals:   Filed Vitals:   07/25/15 0451 07/25/15 0525 07/25/15 0554 07/25/15 0644  BP: 190/46 198/50 188/46 180/43  Pulse:    89  Temp:      TempSrc:      Resp:      Height:      Weight:      SpO2:        Wt Readings from Last 3 Encounters:  07/24/15 84.596 kg (186 lb 8 oz)  07/17/15 85.957 kg (189 lb 8 oz)  06/18/15 86.728 kg (191 lb 3.2 oz)     Intake/Output Summary (Last 24 hours) at 07/25/15 1141 Last data filed at 07/25/15 0900  Gross per 24 hour  Intake      0 ml  Output      0 ml  Net      0 ml    Physical Exam:   GENERAL: Pleasant-appearing in no apparent distress.  HEAD, EYES, EARS, NOSE AND THROAT: Atraumatic, normocephalic. Extraocular muscles are intact. Pupils equal and reactive to light. Sclerae anicteric. No conjunctival injection. No oro-pharyngeal erythema.  NECK: Supple. There is no jugular venous distention. No bruits, no lymphadenopathy, no thyromegaly.  HEART: Regular rate and rhythm,. No murmurs, no rubs, no clicks.  LUNGS: Clear to auscultation bilaterally. No rales or rhonchi. No wheezes.  ABDOMEN: Soft, flat, nontender, nondistended. Has good bowel sounds. No hepatosplenomegaly appreciated.  EXTREMITIES: No evidence of any cyanosis, clubbing, or peripheral edema.  +2 pedal and radial pulses bilaterally.  NEUROLOGIC: The  patient is a little sleepy but able to answer questions not oriented to place or time with no focal motor or sensory deficits appreciated bilaterally.  SKIN: Moist and warm with no rashes appreciated.  Psych: Not anxious, depressed LN: No inguinal LN enlargement    Antibiotics   Anti-infectives    None      Medications   Scheduled Meds: . aspirin EC  81 mg Oral  Daily  . carvedilol  25 mg Oral BID WC  . divalproex  500 mg Oral Q12H  . enoxaparin (LOVENOX) injection  40 mg Subcutaneous QHS  . levothyroxine  50 mcg Oral QAC breakfast  . memantine  5 mg Oral Daily  . modafinil  100 mg Oral Daily  . pantoprazole  40 mg Oral QAC breakfast  . risperiDONE  3 mg Oral QHS  . sodium chloride flush  3 mL Intravenous Q12H  . traZODone  50 mg Oral QHS   Continuous Infusions: . sodium chloride     PRN Meds:.allopurinol, ALPRAZolam, bisacodyl, cloNIDine, hydrALAZINE, ondansetron **OR** ondansetron (ZOFRAN) IV   Data Review:   Micro Results Recent Results (from the past 240 hour(s))  Urine culture     Status: None   Collection Time: 07/17/15  4:16 PM  Result Value Ref Range Status   Culture ESCHERICHIA COLI  Final   Colony Count >=100,000 COLONIES/ML  Final   Organism ID, Bacteria ESCHERICHIA COLI  Final      Susceptibility   Escherichia coli -  (no method available)    CEFAZOLIN 25 Sensitive     AMPICILLIN >=32 Resistant     AMOX/CLAVULANIC 16 Intermediate     AMPICILLIN/SULBACTAM >=32 Resistant     PIP/TAZO <=4 Sensitive     IMIPENEM <=0.25 Sensitive     CEFTRIAXONE <=1 Sensitive     CEFTAZIDIME <=1 Sensitive     CEFEPIME <=1 Sensitive     GENTAMICIN <=1 Sensitive     TOBRAMYCIN <=1 Sensitive     CIPROFLOXACIN 1 Sensitive     LEVOFLOXACIN 1 Sensitive     NITROFURANTOIN <=16 Sensitive     TRIMETH/SULFA* <=20 Sensitive      * NR=NOT REPORTABLE,SEE COMMENTORAL therapy:A cefazolin MIC of <32 predicts susceptibility to the oral agents  cefaclor,cefdinir,cefpodoxime,cefprozil,cefuroxime,cephalexin,and loracarbef when used for therapy of uncomplicated UTIs due to E.coli,K.pneumomiae,and P.mirabilis. PARENTERAL therapy: A cefazolinMIC of >8 indicates resistance to parenteralcefazolin. An alternate test method must beperformed to confirm susceptibility to parenteralcefazolin.    Radiology Reports Dg Pelvis 1-2 Views  07/23/2015  CLINICAL DATA:  Fall EXAM: PELVIS - 1-2 VIEW COMPARISON:  None. FINDINGS: There is no evidence of pelvic fracture or diastasis. Suboptimal study due to patient's large body habitus. Mild degenerative changes bilateral hip joints with mild superior acetabular spurring. IMPRESSION: No acute fracture or subluxation. Mild degenerative changes bilateral hip joints. Electronically Signed   By: Natasha Mead M.D.   On: 07/23/2015 20:25   Ct Head Wo Contrast  07/23/2015  CLINICAL DATA:  Fall. EXAM: CT HEAD WITHOUT CONTRAST TECHNIQUE: Contiguous axial images were obtained from the base of the skull through the vertex without intravenous contrast. COMPARISON:  01/06/2014 head CT. FINDINGS: No evidence of parenchymal hemorrhage or extra-axial fluid collection. No mass lesion, mass effect, or midline shift. No CT evidence of acute infarction. Intracranial atherosclerosis. Mild diffuse cerebral volume loss. Nonspecific mild subcortical and periventricular white matter hypodensity, most in keeping with chronic small vessel ischemic change. No ventriculomegaly. The visualized paranasal sinuses are essentially clear. The mastoid air cells are unopacified. No evidence of calvarial fracture. IMPRESSION: 1. No evidence of acute intracranial abnormality. No calvarial fracture . 2. Mild cerebral volume loss and mild chronic small vessel ischemia. Electronically Signed   By: Delbert Phenix M.D.   On: 07/23/2015 19:20     CBC  Recent Labs Lab 07/23/15 1849 07/24/15 0639 07/25/15 0432  WBC 4.0 3.6 2.9*  HGB 12.0 10.9* 10.8*  HCT 33.9*  31.3* 30.8*  PLT 140* 122* 128*  MCV 91.2 90.3 90.2  MCH 32.2 31.5 31.7  MCHC 35.3 34.9 35.2  RDW 14.0 14.3 14.1    Chemistries   Recent Labs Lab 07/23/15 1849 07/24/15 0639 07/25/15 0432  NA 138 139 140  K 3.1* 3.1* 3.5  CL 97* 103 107  CO2 33* 29 26  GLUCOSE 166* 116* 116*  BUN 17 17 18   CREATININE 1.71* 1.60* 1.47*  CALCIUM 9.5 9.0 9.3   ------------------------------------------------------------------------------------------------------------------ estimated creatinine clearance is 35.8 mL/min (by C-G formula based on Cr of 1.47). ------------------------------------------------------------------------------------------------------------------ No results for input(s): HGBA1C in the last 72 hours. ------------------------------------------------------------------------------------------------------------------ No results for input(s): CHOL, HDL, LDLCALC, TRIG, CHOLHDL, LDLDIRECT in the last 72 hours. ------------------------------------------------------------------------------------------------------------------ No results for input(s): TSH, T4TOTAL, T3FREE, THYROIDAB in the last 72 hours.  Invalid input(s): FREET3 ------------------------------------------------------------------------------------------------------------------ No results for input(s): VITAMINB12, FOLATE, FERRITIN, TIBC, IRON, RETICCTPCT in the last 72 hours.  Coagulation profile No results for input(s): INR, PROTIME in the last 168 hours.  No results for input(s): DDIMER in the last 72 hours.  Cardiac Enzymes  Recent Labs Lab 07/24/15 0049 07/24/15 0639 07/24/15 1235  TROPONINI <0.03 <0.03 <0.03   ------------------------------------------------------------------------------------------------------------------ Invalid input(s): POCBNP    Assessment & Plan   80 year old female patient with history of for diabetes mellitus, hypertension, CHF presented to the emergency room with confusion  and lethargy. Admitting diagnosis  1. Acute encephalopathy could be related to worsening dementia-  Patient's ammonia level was normal, CT scan of the head is negative, urinalysis was negative Patient responding to Provigil and IV hydration 2. Hypokalemia improved recheck tomorrow  3. Dehydration continue IV fluid 4. Acute rhabdomyolysis IV fluids CPK trending down  5. Likely fall no evidence of arrhythmia or any other cardiac symptoms to suggest syncope we'll try to ambulate patient 6. Hyperlipidemia we'll stop her cholesterol in lowering drug 7. Hypertension we'll resume her clonidine blood pressure is labile with systolic blood pressure higher I will add ARB 8. Dementia we'll continue trazodone and Risperdal      Code Status Orders        Start     Ordered   07/24/15 0218  Full code   Continuous     07/24/15 0218    Code Status History    Date Active Date Inactive Code Status Order ID Comments User Context   09/03/2014  1:01 PM 09/05/2014  6:09 PM Full Code [id number]  Enedina FinnerSona Tauheedah Bok, MD Inpatient           Consults none DVT Prophylaxis  Lovenox  Lab Results  Component Value Date   PLT 128* 07/25/2015     Time Spent in minutes  35 minutes  Greater than 50% of time spent in care coordination and counseling patient regarding the condition and plan of care. Case discussed with patient's daughter updated on her condition   Auburn BilberryPATEL, Valyn Latchford M.D on 07/25/2015 at 11:41 AM  Between 7am to 6pm - Pager - [phone]  After 6pm go to [email or web address] - password [email or web address] [facility]  [facility]   Office  [phone]

## 2015-07-25 NOTE — Progress Notes (Signed)
*  PRELIMINARY RESULTS* Echocardiogram 2D Echocardiogram has been performed.  [name] 07/25/2015, 7:07 PM

## 2015-07-25 NOTE — Care Management Important Message (Signed)
Important Message  Patient Details  Name: [name] MRN: [id number] Date of Birth: 1935-11-19   Medicare Important Message Given:  Yes    Chapman FitchBOWEN, Maylon Sailors T, RN 07/25/2015, 11:33 AM

## 2015-07-25 NOTE — Progress Notes (Signed)
Pt after several attempts was reluctant to taking medications and getting bp checked. She stated 'my doctor told me which medications to take,' I'm not taking that. Pt agitation increased into the afternoon; however while the pt was alone in the room the pt just rested. When she noticed staff in the room, she insisted on leaving and called out to someone not present named "corretta'.  Pt in bed resting continue to assess.

## 2015-07-25 NOTE — Clinical Documentation Improvement (Signed)
Internal Medicine  Please clarify the type of "Acute Encephalopathy" your patient has and document findings in next progress note. Thank you!   Metabolic secondary to Rhabdomyolysis, worsening dementia - CK 1386  Other  Clinically Undetermined  Supporting Information:  Confusion, Altered Mental Status, Dementia - being treated with Trazodone and Risperdal  Please exercise your independent, professional judgment when responding. A specific answer is not anticipated or expected.  Thank You, Shellee MiloEileen T Strummer Canipe RN, BSN, CCDS Health Information Management [place] [phone]; Cell: [phone]

## 2015-07-25 NOTE — Progress Notes (Signed)
Notified Dr. Loney Lohseni for elevated blood pressure. Order received. Last time BP checked was 181/51.Patient is alert and confused. No signs of acute distress.

## 2015-07-26 LAB — BASIC METABOLIC PANEL
Anion gap: 4 — ABNORMAL LOW (ref 5–15)
BUN: 17 mg/dL (ref 6–20)
CALCIUM: 9 mg/dL (ref 8.9–10.3)
CO2: 25 mmol/L (ref 22–32)
CREATININE: 1.34 mg/dL — AB (ref 0.44–1.00)
Chloride: 110 mmol/L (ref 101–111)
GFR calc Af Amer: 43 mL/min — ABNORMAL LOW (ref >60)
GFR calc non Af Amer: 37 mL/min — ABNORMAL LOW (ref >60)
GLUCOSE: 118 mg/dL — AB (ref 65–99)
Potassium: 3.4 mmol/L — ABNORMAL LOW (ref 3.5–5.1)
Sodium: 139 mmol/L (ref 135–145)

## 2015-07-26 LAB — MAGNESIUM: Magnesium: 1.8 mg/dL (ref 1.7–2.4)

## 2015-07-26 LAB — ECHOCARDIOGRAM COMPLETE
HEIGHTINCHES: 67 in
WEIGHTICAEL: 2984 [oz_av]

## 2015-07-26 LAB — CK: Total CK: 601 U/L — ABNORMAL HIGH (ref 38–234)

## 2015-07-26 MED ORDER — HALOPERIDOL LACTATE 5 MG/ML IJ SOLN
1.0000 mg | Freq: Four times a day (QID) | INTRAMUSCULAR | Status: DC | PRN
  Administered 2015-07-26 – 2015-07-28 (×4): 1 mg via INTRAVENOUS
  Filled 2015-07-26 (×4): qty 1

## 2015-07-26 MED ORDER — AMLODIPINE BESYLATE 5 MG PO TABS
5.0000 mg | ORAL_TABLET | Freq: Every day | ORAL | Status: DC
  Administered 2015-07-26: 5 mg via ORAL
  Filled 2015-07-26: qty 1

## 2015-07-26 MED ORDER — LORAZEPAM 2 MG/ML IJ SOLN
0.5000 mg | Freq: Once | INTRAMUSCULAR | Status: AC
  Administered 2015-07-27: 0.5 mg via INTRAVENOUS
  Filled 2015-07-26 (×2): qty 1

## 2015-07-26 MED ORDER — HALOPERIDOL LACTATE 5 MG/ML IJ SOLN
5.0000 mg | Freq: Once | INTRAMUSCULAR | Status: AC
  Administered 2015-07-26: 5 mg via INTRAMUSCULAR
  Filled 2015-07-26: qty 1

## 2015-07-26 NOTE — Progress Notes (Signed)
   07/26/15 1300  Clinical Encounter Type  Visited With Patient  Visit Type Initial  Referral From Nurse  Spiritual Encounters  Spiritual Needs Literature  Stress Factors  Patient Stress Factors Health changes  Chaplain helped patient fill out successfully the AD information. Patient filled out HCPOA. Chaplain made copy and gave copy to staff to place in chart. Patient and family received original copy.

## 2015-07-26 NOTE — Plan of Care (Signed)
Problem: Safety: Goal: Ability to remain free from injury will improve Outcome: Not Progressing Pt continues to try and get out of bed during my shift. Bed alarm activated and pt moved to room closer to nursing station.   Problem: Nutrition: Goal: Adequate nutrition will be maintained Outcome: Not Progressing Pt has pulled her PIV out and refuses to have it replaced during my shift. MD notified

## 2015-07-26 NOTE — Progress Notes (Signed)
Pt has made many attempts to get OOB and has pulled out her PIV. Pt refuses to let RN restart PIV and covers her arm to avoid being stuck. MD paged and informed if situation. MD ordered Haldol 5mg  IM. Pt continues to try and get out the bed and refuses to have PIV restarted. Pt moved to a room closer to nursing station. Will continue to try and restart PIV.

## 2015-07-26 NOTE — Progress Notes (Signed)
Patient ID: [name], female   DOB: 1935-12-08, 80 y.o.   MRN: [id number] [facility] PRO[facility]  [name] [id number] DOB: 1935-12-08 DOA: 07/23/2015 PCP: [name] ShamsULLO, TERESA L, MD  HPI/Subjective: Patient states that she lives alone and this was confirmed by the daughter. Patient feels okay and offered no complaints. She did answer some yes or no questions. She kept calling out for her daughter but her daughter left at the time that I saw her.  Objective: Filed Vitals:   07/26/15 0512 07/26/15 1214  BP: 177/62 159/47  Pulse:  82  Temp:  97.8 F (36.6 C)  Resp:      Filed Weights   07/23/15 1847 07/24/15 0213  Weight: 85.73 kg (189 lb) 84.596 kg (186 lb 8 oz)    ROS: Review of Systems  Constitutional: Negative for fever and chills.  Eyes: Negative for blurred vision.  Respiratory: Negative for cough and shortness of breath.   Cardiovascular: Negative for chest pain.  Gastrointestinal: Negative for nausea, vomiting, abdominal pain, diarrhea and constipation.  Genitourinary: Negative for dysuria.  Musculoskeletal: Negative for joint pain.  Neurological: Negative for dizziness and headaches.   Exam: Physical Exam  HENT:  Nose: No mucosal edema.  Mouth/Throat: No oropharyngeal exudate or posterior oropharyngeal edema.  Eyes: Conjunctivae, EOM and lids are normal. Pupils are equal, round, and reactive to light.  Neck: No JVD present. Carotid bruit is not present. No edema present. No thyroid mass and no thyromegaly present.  Cardiovascular: S1 normal and S2 normal.  Exam reveals no gallop.   No murmur heard. Pulses:      Dorsalis pedis pulses are 2+ on the right side, and 2+ on the left side.  Respiratory: No respiratory distress. She has no wheezes. She has no rhonchi. She has no rales.  GI: Soft. Bowel sounds are normal. There is no tenderness.  Musculoskeletal:       Right ankle: She exhibits swelling.       Left ankle: She exhibits swelling.   Lymphadenopathy:    She has no cervical adenopathy.  Neurological: She is alert. No cranial nerve deficit.  Able to straight leg raise.  Skin: Skin is warm. No rash noted. Nails show no clubbing.  Psychiatric: She has a normal mood and affect.      Data Reviewed: Basic Metabolic Panel:  Recent Labs Lab 07/23/15 1849 07/24/15 0639 07/25/15 0432 07/26/15 0605  NA 138 139 140 139  K 3.1* 3.1* 3.5 3.4*  CL 97* 103 107 110  CO2 33* 29 26 25   GLUCOSE 166* 116* 116* 118*  BUN 17 17 18 17   CREATININE 1.71* 1.60* 1.47* 1.34*  CALCIUM 9.5 9.0 9.3 9.0    Recent Labs Lab 07/23/15 2105  AMMONIA 14   CBC:  Recent Labs Lab 07/23/15 1849 07/24/15 0639 07/25/15 0432  WBC 4.0 3.6 2.9*  HGB 12.0 10.9* 10.8*  HCT 33.9* 31.3* 30.8*  MCV 91.2 90.3 90.2  PLT 140* 122* 128*   Cardiac Enzymes:  Recent Labs Lab 07/23/15 1849 07/24/15 0049 07/24/15 0639 07/24/15 1235 07/25/15 0432 07/25/15 1207 07/26/15 0605  CKTOTAL 1386*  --  1143*  --  800* 642* 601*  TROPONINI  --  <0.03 <0.03 <0.03  --   --   --    BNP (last 3 results)  Recent Labs  09/07/14 0352 07/23/15 1849  BNP 79.0 92.0    CBG:  Recent Labs Lab 07/23/15 1935 07/24/15 0216  GLUCAP 137* 139*  Recent Results (from the past 240 hour(s))  Urine culture     Status: None   Collection Time: 07/17/15  4:16 PM  Result Value Ref Range Status   Culture ESCHERICHIA COLI  Final   Colony Count >=100,000 COLONIES/ML  Final   Organism ID, Bacteria ESCHERICHIA COLI  Final      Susceptibility   Escherichia coli -  (no method available)    CEFAZOLIN 25 Sensitive     AMPICILLIN >=32 Resistant     AMOX/CLAVULANIC 16 Intermediate     AMPICILLIN/SULBACTAM >=32 Resistant     PIP/TAZO <=4 Sensitive     IMIPENEM <=0.25 Sensitive     CEFTRIAXONE <=1 Sensitive     CEFTAZIDIME <=1 Sensitive     CEFEPIME <=1 Sensitive     GENTAMICIN <=1 Sensitive     TOBRAMYCIN <=1 Sensitive     CIPROFLOXACIN 1 Sensitive      LEVOFLOXACIN 1 Sensitive     NITROFURANTOIN <=16 Sensitive     TRIMETH/SULFA* <=20 Sensitive      * NR=NOT REPORTABLE,SEE COMMENTORAL therapy:A cefazolin MIC of <32 predicts susceptibility to the oral agents cefaclor,cefdinir,cefpodoxime,cefprozil,cefuroxime,cephalexin,and loracarbef when used for therapy of uncomplicated UTIs due to E.coli,K.pneumomiae,and P.mirabilis. PARENTERAL therapy: A cefazolinMIC of >8 indicates resistance to parenteralcefazolin. An alternate test method must beperformed to confirm susceptibility to parenteralcefazolin.      Scheduled Meds: . amLODipine  5 mg Oral Daily  . aspirin EC  81 mg Oral Daily  . carvedilol  25 mg Oral BID WC  . divalproex  500 mg Oral Q12H  . enoxaparin (LOVENOX) injection  40 mg Subcutaneous QHS  . irbesartan  75 mg Oral Daily  . levothyroxine  50 mcg Oral QAC breakfast  . LORazepam  0.5 mg Intravenous Once  . memantine  5 mg Oral Daily  . modafinil  100 mg Oral Daily  . pantoprazole  40 mg Oral QAC breakfast  . risperiDONE  3 mg Oral QHS  . sodium chloride flush  3 mL Intravenous Q12H  . traZODone  50 mg Oral QHS   Continuous Infusions: . sodium chloride 100 mL/hr at 07/26/15 1358    Assessment/Plan:  1. Acute encephalopathy. This seems better to me. The daughter is very concerned about her mental status and she wanted me to get an MRI of the brain. I ordered an MRI of the brain for further evaluation. 2. Acute rhabdomyolysis. Stop Crestor and give IV fluid hydration. Check CPK in the a.m. 3. Essential hypertension continue amlodipine, Coreg, irbesartan 4. Hypothyroidism unspecified continue levothyroxine 5. History of dementia on Namenda, risperidone and trazodone 6. History of CHF- currently no signs 7. Chronic kidney disease stage III continue to monitor. 8. Recent urinary tract infection. Urinalysis on this admission was negative.  Code Status:     Code Status Orders        Start     Ordered   07/24/15 0218  Full  code   Continuous     07/24/15 0218    Code Status History    Date Active Date Inactive Code Status Order ID Comments User Context   09/03/2014  1:01 PM 09/05/2014  6:09 PM Full Code [id number]  [name] Finner, MD Inpatient     Family Communication: Daughter on phone Disposition Plan: To be determined  Time spent: 25 minutes  Alford Highland  [facility]

## 2015-07-27 ENCOUNTER — Inpatient Hospital Stay: Payer: Medicare Other

## 2015-07-27 LAB — COMPREHENSIVE METABOLIC PANEL
ALT: 16 U/L (ref 14–54)
AST: 24 U/L (ref 15–41)
Albumin: 2.5 g/dL — ABNORMAL LOW (ref 3.5–5.0)
Alkaline Phosphatase: 62 U/L (ref 38–126)
Anion gap: 3 — ABNORMAL LOW (ref 5–15)
BUN: 19 mg/dL (ref 6–20)
CHLORIDE: 113 mmol/L — AB (ref 101–111)
CO2: 24 mmol/L (ref 22–32)
Calcium: 8.7 mg/dL — ABNORMAL LOW (ref 8.9–10.3)
Creatinine, Ser: 1.46 mg/dL — ABNORMAL HIGH (ref 0.44–1.00)
GFR, EST AFRICAN AMERICAN: 39 mL/min — AB (ref >60)
GFR, EST NON AFRICAN AMERICAN: 33 mL/min — AB (ref >60)
Glucose, Bld: 107 mg/dL — ABNORMAL HIGH (ref 65–99)
POTASSIUM: 3.3 mmol/L — AB (ref 3.5–5.1)
Sodium: 140 mmol/L (ref 135–145)
Total Bilirubin: 0.3 mg/dL (ref 0.3–1.2)
Total Protein: 5.7 g/dL — ABNORMAL LOW (ref 6.5–8.1)

## 2015-07-27 LAB — CK: CK TOTAL: 386 U/L — AB (ref 38–234)

## 2015-07-27 MED ORDER — AMLODIPINE BESYLATE 10 MG PO TABS
10.0000 mg | ORAL_TABLET | Freq: Every day | ORAL | Status: DC
  Administered 2015-07-27 – 2015-07-28 (×2): 10 mg via ORAL
  Filled 2015-07-27 (×2): qty 1

## 2015-07-27 NOTE — Progress Notes (Signed)
Notified Dr. Renae GlossWieting that right arm BP is consistently reading lower than left arm. Per Dr. Renae GlossWieting use right arm for BPs. No new order received will continue to monitor.

## 2015-07-27 NOTE — Progress Notes (Signed)
Patient ID: [name], female   DOB: 13-May-1935, 80 y.o.   MRN: [id number]  [facility] PRO[facility]ESS [facility]  [name] [id number] DOB: 13-May-1935 DOA: 07/23/2015 PCP: Sherlene ShamsULLO, TERESA L, MD  HPI/Subjective: Patient does answer some questions appropriately. Some questions not as appropriate. Feels okay. Offers no complaints. She was able to recall one thing that she had on her breakfast tray.  Objective: Filed Vitals:   07/27/15 1325 07/27/15 1331  BP: 151/94 148/73  Pulse: 84 80  Temp: 98.1 F (36.7 C) 98.6 F (37 C)  Resp: 17 16    Filed Weights   07/23/15 1847 07/24/15 0213  Weight: 85.73 kg (189 lb) 84.596 kg (186 lb 8 oz)    ROS: Review of Systems  Constitutional: Negative for fever and chills.  Eyes: Negative for blurred vision.  Respiratory: Negative for cough and shortness of breath.   Cardiovascular: Negative for chest pain.  Gastrointestinal: Negative for nausea, vomiting, abdominal pain, diarrhea and constipation.  Genitourinary: Negative for dysuria.  Musculoskeletal: Negative for joint pain.  Neurological: Negative for dizziness and headaches.   Exam: Physical Exam  HENT:  Nose: No mucosal edema.  Mouth/Throat: No oropharyngeal exudate or posterior oropharyngeal edema.  Eyes: Conjunctivae, EOM and lids are normal. Pupils are equal, round, and reactive to light.  Neck: No JVD present. Carotid bruit is not present. No edema present. No thyroid mass and no thyromegaly present.  Cardiovascular: S1 normal and S2 normal.  Exam reveals no gallop.   No murmur heard. Pulses:      Dorsalis pedis pulses are 2+ on the right side, and 2+ on the left side.  Respiratory: No respiratory distress. She has no wheezes. She has no rhonchi. She has no rales.  GI: Soft. Bowel sounds are normal. There is no tenderness.  Musculoskeletal:       Right ankle: She exhibits swelling.       Left ankle: She exhibits swelling.  Lymphadenopathy:    She has no cervical  adenopathy.  Neurological: She is alert. No cranial nerve deficit.  Able to straight leg raise.  Skin: Skin is warm. No rash noted. Nails show no clubbing.  Psychiatric: She has a normal mood and affect.      Data Reviewed: Basic Metabolic Panel:  Recent Labs Lab 07/23/15 1849 07/24/15 0639 07/25/15 0432 07/26/15 0605 07/27/15 0533  NA 138 139 140 139 140  K 3.1* 3.1* 3.5 3.4* 3.3*  CL 97* 103 107 110 113*  CO2 33* 29 26 25 24   GLUCOSE 166* 116* 116* 118* 107*  BUN 17 17 18 17 19   CREATININE 1.71* 1.60* 1.47* 1.34* 1.46*  CALCIUM 9.5 9.0 9.3 9.0 8.7*  MG  --   --   --  1.8  --     Recent Labs Lab 07/23/15 2105  AMMONIA 14   CBC:  Recent Labs Lab 07/23/15 1849 07/24/15 0639 07/25/15 0432  WBC 4.0 3.6 2.9*  HGB 12.0 10.9* 10.8*  HCT 33.9* 31.3* 30.8*  MCV 91.2 90.3 90.2  PLT 140* 122* 128*   Cardiac Enzymes:  Recent Labs Lab 07/24/15 0049 07/24/15 0639 07/24/15 1235 07/25/15 0432 07/25/15 1207 07/26/15 0605 07/27/15 0533  CKTOTAL  --  1143*  --  800* 642* 601* 386*  TROPONINI <0.03 <0.03 <0.03  --   --   --   --    BNP (last 3 results)  Recent Labs  09/07/14 0352 07/23/15 1849  BNP 79.0 92.0    CBG:  Recent  Labs Lab 07/23/15 1935 07/24/15 0216  GLUCAP 137* 139*    Recent Results (from the past 240 hour(s))  Urine culture     Status: None   Collection Time: 07/17/15  4:16 PM  Result Value Ref Range Status   Culture ESCHERICHIA COLI  Final   Colony Count >=100,000 COLONIES/ML  Final   Organism ID, Bacteria ESCHERICHIA COLI  Final      Susceptibility   Escherichia coli -  (no method available)    CEFAZOLIN 25 Sensitive     AMPICILLIN >=32 Resistant     AMOX/CLAVULANIC 16 Intermediate     AMPICILLIN/SULBACTAM >=32 Resistant     PIP/TAZO <=4 Sensitive     IMIPENEM <=0.25 Sensitive     CEFTRIAXONE <=1 Sensitive     CEFTAZIDIME <=1 Sensitive     CEFEPIME <=1 Sensitive     GENTAMICIN <=1 Sensitive     TOBRAMYCIN <=1 Sensitive      CIPROFLOXACIN 1 Sensitive     LEVOFLOXACIN 1 Sensitive     NITROFURANTOIN <=16 Sensitive     TRIMETH/SULFA* <=20 Sensitive      * NR=NOT REPORTABLE,SEE COMMENTORAL therapy:A cefazolin MIC of <32 predicts susceptibility to the oral agents cefaclor,cefdinir,cefpodoxime,cefprozil,cefuroxime,cephalexin,and loracarbef when used for therapy of uncomplicated UTIs due to E.coli,K.pneumomiae,and P.mirabilis. PARENTERAL therapy: A cefazolinMIC of >8 indicates resistance to parenteralcefazolin. An alternate test method must beperformed to confirm susceptibility to parenteralcefazolin.      Scheduled Meds: . amLODipine  10 mg Oral Daily  . aspirin EC  81 mg Oral Daily  . carvedilol  25 mg Oral BID WC  . divalproex  500 mg Oral Q12H  . enoxaparin (LOVENOX) injection  40 mg Subcutaneous QHS  . irbesartan  75 mg Oral Daily  . levothyroxine  50 mcg Oral QAC breakfast  . memantine  5 mg Oral Daily  . pantoprazole  40 mg Oral QAC breakfast  . risperiDONE  3 mg Oral QHS  . sodium chloride flush  3 mL Intravenous Q12H  . traZODone  50 mg Oral QHS   Continuous Infusions: . sodium chloride 50 mL/hr at 07/27/15 0442    Assessment/Plan:  1. Acute encephalopathy. The daughter is very concerned about her mental status. MRI of the brain was negative. 2. Acute rhabdomyolysis. Stop Crestor and can stop IV fluids and CPK has trended better 3. Essential hypertension continue amlodipine, Coreg, irbesartan 4. Hypothyroidism unspecified continue levothyroxine 5. History of dementia on Namenda, risperidone and trazodone 6. History of CHF- currently no signs 7. Chronic kidney disease stage III continue to monitor. 8. Recent urinary tract infection. Urinalysis on this admission was negative.  Code Status:     Code Status Orders        Start     Ordered   07/24/15 0218  Full code   Continuous     07/24/15 0218    Code Status History    Date Active Date Inactive Code Status Order ID Comments User  Context   09/03/2014  1:01 PM 09/05/2014  6:09 PM Full Code [id number]  Enedina Finner, MD Inpatient     Family Communication: Daughter at bedside Disposition Plan: Likely out to rehabilitation potentially tomorrow  Time spent: 20 minutes  Alford Highland  [facility]

## 2015-07-28 MED ORDER — CLONIDINE HCL 0.1 MG PO TABS
0.1000 mg | ORAL_TABLET | Freq: Two times a day (BID) | ORAL | Status: DC
  Filled 2015-07-28: qty 1

## 2015-07-28 MED ORDER — POTASSIUM CHLORIDE CRYS ER 10 MEQ PO TBCR: 10.0000 meq | EXTENDED_RELEASE_TABLET | Freq: Every day | ORAL | Status: DC

## 2015-07-28 MED ORDER — HYDRALAZINE HCL 20 MG/ML IJ SOLN
10.0000 mg | INTRAMUSCULAR | Status: DC | PRN
  Administered 2015-07-28: 10 mg via INTRAVENOUS
  Filled 2015-07-28: qty 1

## 2015-07-28 MED ORDER — ALPRAZOLAM 0.5 MG PO TABS: 0.5000 mg | ORAL_TABLET | Freq: Two times a day (BID) | ORAL | Status: DC | PRN

## 2015-07-28 MED ORDER — TRAZODONE HCL 50 MG PO TABS: 50.0000 mg | ORAL_TABLET | Freq: Every day | ORAL | Status: DC

## 2015-07-28 MED ORDER — CLONIDINE HCL 0.1 MG PO TABS: 0.1000 mg | ORAL_TABLET | Freq: Two times a day (BID) | ORAL | Status: DC

## 2015-07-28 MED ORDER — FUROSEMIDE 40 MG PO TABS: 40.0000 mg | ORAL_TABLET | Freq: Every day | ORAL | Status: DC

## 2015-07-28 NOTE — Care Management Important Message (Signed)
Important Message  Patient Details  Name: [name] MRN: [id number] Date of Birth: [birth year]   Medicare Important Message Given:  Yes    [name] 07/28/2015, 10:25 AM

## 2015-07-28 NOTE — Clinical Social Work Note (Signed)
Patient has been getting up and walking around and is confused beginning last evening and this morning. CSW has made [facility] LongsJoseph at [facility] aware. Patient to discharge today to [facility]. Patient's daughter, Marella BileCoretta, is aware and in agreement with discharge today. Patient's daughter requests transport via EMS. Discharge information sent to [facility].  York SpanielMonica Juliauna Stueve MSW,LCSW [phone]

## 2015-07-28 NOTE — Progress Notes (Signed)
Pt BP elevated 180's over 50's, MD notified, Hydralazine 10mg , !4 hr PRN  ordered and admin. Pt. Refused to get her follow up BP taken. Bbecoming agitated and pulling at the BP cuff.

## 2015-07-28 NOTE — Discharge Summary (Signed)
[facility] - [place] at [facility]   PATIENT NAME: [name]    MR#:  [id number]  DATE OF BIRTH:  02/27/1936  DATE OF ADMISSION:  07/23/2015 ADMITTING PHYSICIAN: Ihor Austin, MD  DATE OF DISCHARGE: 07/28/2015  PRIMARY CARE PHYSICIAN: Sherlene Shams, MD    ADMISSION DIAGNOSIS:  Stupor [R40.1] Fall [W19.XXXA] Elevated CK [R74.8] Altered mental status, unspecified altered mental status type [R41.82]  DISCHARGE DIAGNOSIS:  Active Problems:   Rhabdomyolysis   SECONDARY DIAGNOSIS:   Past Medical History  Diagnosis Date  . Diabetes mellitus   . GERD (gastroesophageal reflux disease)   . Hypertension   . Anemia   . Arthritis   . Hemorrhoids   . Hypertension   . CHF (congestive heart failure) (HCC)   . Renal disorder     HOSPITAL COURSE:   1. Acute encephalopathy. MRI of the brain is negative. Patient answering some questions. Likely underlying dementia. Patient also had a recent urinary tract infection. And was admitted with rhabdomyolysis. 2. Acute rhabdomyolysis. Stop Crestor and allopurinol. CPK trended better 3. Essential hypertension. I did not realize the patient takes standing dose clonidine as outpatient and restart that and continue the Coreg. Take blood pressure in the right arm because that's a little bit lower than the left arm. I'd rather not lower the blood pressure too much. Can add Norvasc for blood pressure of needed. 4. Hypothyroidism unspecified continue levothyroxine 5. History of dementia on Namenda) and trazodone 6. History of CHF currently no signs can restart Lasix as outpatient 7. Chronic kidney disease stage III continue to monitor 8. Recent urinary tract infection. Urinalysis on this admission negative 9. All sugars have been good here in the hospital. No need for insulin.  DISCHARGE CONDITIONS:   Satisfactory  CONSULTS OBTAINED:  Treatment Team:  Auburn Bilberry, MD  DRUG ALLERGIES:   Allergies   Allergen Reactions  . Hydrocodone-Acetaminophen Nausea Only    Other reaction(s): Dizziness  . Morphine And Related Other (See Comments)    Makes patient feel crazy    DISCHARGE MEDICATIONS:   Current Discharge Medication List    CONTINUE these medications which have CHANGED   Details  ALPRAZolam (XANAX) 0.5 MG tablet Take 1 tablet (0.5 mg total) by mouth 2 (two) times daily as needed for anxiety. Qty: 60 tablet, Refills: 0    cloNIDine (CATAPRES) 0.1 MG tablet Take 1 tablet (0.1 mg total) by mouth 2 (two) times daily. Qty: 60 tablet, Refills: 1    furosemide (LASIX) 40 MG tablet Take 1 tablet (40 mg total) by mouth daily. Qty: 30 tablet    potassium chloride SA (K-DUR,KLOR-CON) 10 MEQ tablet Take 1 tablet (10 mEq total) by mouth daily. For 5 days    traZODone (DESYREL) 50 MG tablet Take 1 tablet (50 mg total) by mouth at bedtime. Qty: 30 tablet      CONTINUE these medications which have NOT CHANGED   Details  aspirin EC 81 MG tablet Take 81 mg by mouth daily.    carvedilol (COREG) 25 MG tablet Take 1 tablet (25 mg total) by mouth 2 (two) times daily with a meal. Qty: 60 tablet, Refills: 2    divalproex (DEPAKOTE) 500 MG DR tablet Take by mouth 2 (two) times daily.     levothyroxine (SYNTHROID, LEVOTHROID) 50 MCG tablet Take 1 tablet (50 mcg total) by mouth daily before breakfast. Qty: 90 tablet, Refills: 1    LINZESS 145 MCG CAPS capsule Take 1 capsule by mouth daily  as needed (constipation).     memantine (NAMENDA) 5 MG tablet Take 5 mg by mouth daily.     omeprazole (PRILOSEC) 40 MG capsule Take 1 capsule (40 mg total) by mouth daily. Qty: 30 capsule, Refills: 0    risperiDONE (RISPERDAL) 3 MG tablet Take 3 mg by mouth at bedtime.      STOP taking these medications     allopurinol (ZYLOPRIM) 300 MG tablet      cephALEXin (KEFLEX) 250 MG capsule      insulin lispro (HUMALOG) 100 UNIT/ML injection      rosuvastatin (CRESTOR) 20 MG tablet      insulin  aspart protamine - aspart (NOVOLOG 70/30 MIX) (70-30) 100 UNIT/ML FlexPen          DISCHARGE INSTRUCTIONS:   Follow-up with Dr. rehabilitation 1 day  If you experience worsening of your admission symptoms, develop shortness of breath, life threatening emergency, suicidal or homicidal thoughts you must seek medical attention immediately by calling 911 or calling your MD immediately  if symptoms less severe.  You Must read complete instructions/literature along with all the possible adverse reactions/side effects for all the Medicines you take and that have been prescribed to you. Take any new Medicines after you have completely understood and accept all the possible adverse reactions/side effects.   Please note  You were cared for by a hospitalist during your hospital stay. If you have any questions about your discharge medications or the care you received while you were in the hospital after you are discharged, you can call the unit and asked to speak with the hospitalist on call if the hospitalist that took care of you is not available. Once you are discharged, your primary care physician will handle any further medical issues. Please note that NO REFILLS for any discharge medications will be authorized once you are discharged, as it is imperative that you return to your primary care physician (or establish a relationship with a primary care physician if you do not have one) for your aftercare needs so that they can reassess your need for medications and monitor your lab values.    Today   CHIEF COMPLAINT:   Chief Complaint  Patient presents with  . Weakness    HISTORY OF PRESENT ILLNESS:  [name]  is a 80 y.o. female came in with weakness and was found to have rhabdomyolysis   VITAL SIGNS:  Blood pressure 155/43, pulse 84, temperature 97.7 F (36.5 C), temperature source Oral, resp. rate 20, height  (1.702 m), weight 84.596 kg (186 lb 8 oz), SpO2 99 %.  I/O:    Intake/Output Summary (Last 24 hours) at 07/28/15 0937 Last data filed at 07/28/15 0549  Gross per 24 hour  Intake    373 ml  Output      0 ml  Net    373 ml    PHYSICAL EXAMINATION:  GENERAL:  80 y.o.-year-old patient lying in the bed with no acute distress.  EYES: Pupils equal, round, reactive to light and accommodation. No scleral icterus. Extraocular muscles intact.  HEENT: Head atraumatic, normocephalic. Oropharynx and nasopharynx clear.  NECK:  Supple, no jugular venous distention. No thyroid enlargement, no tenderness.  LUNGS: Normal breath sounds bilaterally, no wheezing, rales,rhonchi or crepitation. No use of accessory muscles of respiration.  CARDIOVASCULAR: S1, S2 normal. No murmurs, rubs, or gallops.  ABDOMEN: Soft, non-tender, non-distended. Bowel sounds present. No organomegaly or mass.  EXTREMITIES: No pedal edema, cyanosis, or clubbing.  NEUROLOGIC:  Cranial nerves II through XII are intact. PSYCHIATRIC: The patient is alert and answers questions.  SKIN: No obvious rash, lesion, or ulcer.   DATA REVIEW:   CBC  Recent Labs Lab 07/25/15 0432  WBC 2.9*  HGB 10.8*  HCT 30.8*  PLT 128*    Chemistries   Recent Labs Lab 07/26/15 0605 07/27/15 0533  NA 139 140  K 3.4* 3.3*  CL 110 113*  CO2 25 24  GLUCOSE 118* 107*  BUN 17 19  CREATININE 1.34* 1.46*  CALCIUM 9.0 8.7*  MG 1.8  --   AST  --  24  ALT  --  16  ALKPHOS  --  62  BILITOT  --  0.3    Cardiac Enzymes  Recent Labs Lab 07/24/15 1235  TROPONINI <0.03       RADIOLOGY:  Mr Brain Wo Contrast  07/27/2015  CLINICAL DATA:  Altered mental status and weakness. Found on floor at home 3 days ago. EXAM: MRI HEAD WITHOUT CONTRAST TECHNIQUE: Multiplanar, multiecho pulse sequences of the brain and surrounding structures were obtained without intravenous contrast. COMPARISON:  Head CT 07/23/2015 and MRI 03/15/2007 FINDINGS: There is no evidence of acute infarct, intracranial hemorrhage, mass,  midline shift, or extra-axial fluid collection. Mild cerebral atrophy is within normal limits for age. T2 hyperintensities in the subcortical and deep cerebral white matter bilaterally are similar to the prior MRI and nonspecific but compatible with mild chronic small vessel ischemic disease. Orbits are unremarkable. The paranasal sinuses are clear. There is a trace right mastoid effusion. Major intracranial vascular flow voids are preserved. IMPRESSION: 1. No acute intracranial abnormality. 2. Mild chronic small vessel ischemic disease. Electronically Signed   By: Sebastian AcheAllen  Grady M.D.   On: 07/27/2015 14:27     Management plans discussed with the patient, family and they are in agreement.  CODE STATUS:     Code Status Orders        Start     Ordered   07/24/15 0218  Full code   Continuous     07/24/15 0218    Code Status History    Date Active Date Inactive Code Status Order ID Comments User Context   09/03/2014  1:01 PM 09/05/2014  6:09 PM Full Code [id number]  Enedina FinnerSona Patel, MD Inpatient      TOTAL TIME TAKING CARE OF THIS PATIENT: 35 minutes.    Alford HighlandWIETING, Jamieson Hetland M.D on 07/28/2015 at 9:37 AM  Between 7am to 6pm - Pager - [phone]  After 6pm go to [email or web address] - password [email or web address] [facility]  [facility]  Office  [phone]  CC: Primary care physician; Sherlene ShamsULLO, TERESA L, MD

## 2015-07-28 NOTE — Clinical Social Work Placement (Signed)
   CLINICAL SOCIAL WORK PLACEMENT  NOTE  Date:  07/28/2015  Patient Details  Name: [name] MRN: [id number] Date of Birth: [birth year]  Clinical Social Work is seeking post-discharge placement for this patient at the Skilled  Nursing Facility level of care (*CSW will initial, date and re-position this form in  chart as items are completed):  Yes   Patient/family provided with [place] Clinical Social Work Department's list of facilities offering this level of care within the geographic area requested by the patient (or if unable, by the patient's family).  Yes   Patient/family informed of their freedom to choose among providers that offer the needed level of care, that participate in Medicare, Medicaid or managed care program needed by the patient, have an available bed and are willing to accept the patient.  Yes   Patient/family informed of [place]'s ownership interest in [facility] and [facility], as well as of the fact that they are under no obligation to receive care at these facilities.  PASRR submitted to EDS on 07/24/15     PASRR number received on 07/24/15     Existing PASRR number confirmed on       FL2 transmitted to all facilities in geographic area requested by pt/family on 07/24/15     FL2 transmitted to all facilities within larger geographic area on       Patient informed that his/her managed care company has contracts with or will negotiate with certain facilities, including the following:        Yes   Patient/family informed of bed offers received.  Patient chooses bed at  [facility])     Physician recommends and patient chooses bed at  [facility](SNF)    Patient to be transferred to  (SNF) on 07/28/15.  Patient to be transferred to facility by  (EMS)     Patient family notified on 07/28/15 of transfer.  Name of family member notified:  daughter: coretta     PHYSICIAN       Additional Comment:     _______________________________________________ [name] SpanielMonica Danessa Mensch, LCSW 07/28/2015, 2:11 PM

## 2015-07-28 NOTE — Progress Notes (Signed)
Pt stable. IV removed. Report called to [facility] and EMS transport called. Will notify daughter when EMS arrives.

## 2015-07-28 NOTE — Progress Notes (Signed)
Spoke with Zeb, [employer] rep at [phone], to notify of non-emergent EMS transport.  Auth notification reference given as [id number].   Service date range good from 07/28/15 - 10/26/15.   Gap exception requested to determine if services can be considered at an in-network level.

## 2015-07-29 ENCOUNTER — Telehealth: Payer: Self-pay | Admitting: *Deleted

## 2015-07-29 NOTE — Telephone Encounter (Signed)
Febe from [facility] stated that patient discharged from [facility] [place], on 07/28/15. The diagnosis code is R4182 :altered mental status unspecified.  Contact [phone] Ext [phone]

## 2015-07-29 NOTE — Telephone Encounter (Signed)
Attempted to call Febe back, left a VM to return my call for clarification.

## 2015-07-30 ENCOUNTER — Other Ambulatory Visit: Payer: Self-pay

## 2015-07-30 NOTE — Telephone Encounter (Signed)
Spoke to daughter, Marella BileCoretta.  HIPPA cleared.  She reports patient was discharged from hospital 07/28/15 and admitted to [facility] skilled nursing facility.  She is expected to stay 4-6 weeks or longer if needed.  Encouraged her to contact the office if needed and follow up with PCP once she is discharged home.

## 2015-08-13 ENCOUNTER — Other Ambulatory Visit: Payer: Self-pay

## 2015-08-19 ENCOUNTER — Ambulatory Visit: Payer: Self-pay | Admitting: Internal Medicine

## 2015-09-22 ENCOUNTER — Emergency Department: Payer: Medicare Other

## 2015-09-22 ENCOUNTER — Inpatient Hospital Stay
Admission: EM | Admit: 2015-09-22 | Discharge: 2015-09-26 | DRG: 871 | Disposition: A | Discharge status: Skilled Nursing Facility | Payer: Medicare Other | Attending: Internal Medicine | Admitting: Internal Medicine

## 2015-09-22 ENCOUNTER — Encounter: Payer: Self-pay | Admitting: Emergency Medicine

## 2015-09-22 DIAGNOSIS — Z90710 Acquired absence of both cervix and uterus: Secondary | ICD-10-CM

## 2015-09-22 DIAGNOSIS — I2510 Atherosclerotic heart disease of native coronary artery without angina pectoris: Secondary | ICD-10-CM | POA: Diagnosis present

## 2015-09-22 DIAGNOSIS — E1122 Type 2 diabetes mellitus with diabetic chronic kidney disease: Secondary | ICD-10-CM | POA: Diagnosis present

## 2015-09-22 DIAGNOSIS — E785 Hyperlipidemia, unspecified: Secondary | ICD-10-CM | POA: Diagnosis present

## 2015-09-22 DIAGNOSIS — Z809 Family history of malignant neoplasm, unspecified: Secondary | ICD-10-CM | POA: Diagnosis not present

## 2015-09-22 DIAGNOSIS — M1990 Unspecified osteoarthritis, unspecified site: Secondary | ICD-10-CM | POA: Diagnosis present

## 2015-09-22 DIAGNOSIS — I509 Heart failure, unspecified: Secondary | ICD-10-CM | POA: Diagnosis present

## 2015-09-22 DIAGNOSIS — Z885 Allergy status to narcotic agent status: Secondary | ICD-10-CM

## 2015-09-22 DIAGNOSIS — E2740 Unspecified adrenocortical insufficiency: Secondary | ICD-10-CM | POA: Diagnosis present

## 2015-09-22 DIAGNOSIS — R4182 Altered mental status, unspecified: Secondary | ICD-10-CM | POA: Diagnosis present

## 2015-09-22 DIAGNOSIS — L8990 Pressure ulcer of unspecified site, unspecified stage: Secondary | ICD-10-CM

## 2015-09-22 DIAGNOSIS — Z79899 Other long term (current) drug therapy: Secondary | ICD-10-CM

## 2015-09-22 DIAGNOSIS — Z9049 Acquired absence of other specified parts of digestive tract: Secondary | ICD-10-CM

## 2015-09-22 DIAGNOSIS — I639 Cerebral infarction, unspecified: Secondary | ICD-10-CM

## 2015-09-22 DIAGNOSIS — G039 Meningitis, unspecified: Secondary | ICD-10-CM

## 2015-09-22 DIAGNOSIS — E876 Hypokalemia: Secondary | ICD-10-CM | POA: Diagnosis present

## 2015-09-22 DIAGNOSIS — I10 Essential (primary) hypertension: Secondary | ICD-10-CM | POA: Diagnosis not present

## 2015-09-22 DIAGNOSIS — K219 Gastro-esophageal reflux disease without esophagitis: Secondary | ICD-10-CM | POA: Diagnosis present

## 2015-09-22 DIAGNOSIS — Z7982 Long term (current) use of aspirin: Secondary | ICD-10-CM

## 2015-09-22 DIAGNOSIS — E039 Hypothyroidism, unspecified: Secondary | ICD-10-CM | POA: Diagnosis present

## 2015-09-22 DIAGNOSIS — N183 Chronic kidney disease, stage 3 unspecified: Secondary | ICD-10-CM

## 2015-09-22 DIAGNOSIS — Z8249 Family history of ischemic heart disease and other diseases of the circulatory system: Secondary | ICD-10-CM

## 2015-09-22 DIAGNOSIS — G9341 Metabolic encephalopathy: Secondary | ICD-10-CM | POA: Diagnosis present

## 2015-09-22 DIAGNOSIS — A419 Sepsis, unspecified organism: Principal | ICD-10-CM

## 2015-09-22 DIAGNOSIS — I130 Hypertensive heart and chronic kidney disease with heart failure and stage 1 through stage 4 chronic kidney disease, or unspecified chronic kidney disease: Secondary | ICD-10-CM | POA: Diagnosis present

## 2015-09-22 DIAGNOSIS — Z86718 Personal history of other venous thrombosis and embolism: Secondary | ICD-10-CM | POA: Diagnosis not present

## 2015-09-22 DIAGNOSIS — D61818 Other pancytopenia: Secondary | ICD-10-CM

## 2015-09-22 DIAGNOSIS — R4189 Other symptoms and signs involving cognitive functions and awareness: Secondary | ICD-10-CM

## 2015-09-22 DIAGNOSIS — F0151 Vascular dementia with behavioral disturbance: Secondary | ICD-10-CM | POA: Diagnosis present

## 2015-09-22 DIAGNOSIS — Z8673 Personal history of transient ischemic attack (TIA), and cerebral infarction without residual deficits: Secondary | ICD-10-CM | POA: Diagnosis not present

## 2015-09-22 DIAGNOSIS — F17220 Nicotine dependence, chewing tobacco, uncomplicated: Secondary | ICD-10-CM | POA: Diagnosis present

## 2015-09-22 DIAGNOSIS — T68XXXA Hypothermia, initial encounter: Secondary | ICD-10-CM | POA: Diagnosis present

## 2015-09-22 DIAGNOSIS — E119 Type 2 diabetes mellitus without complications: Secondary | ICD-10-CM | POA: Diagnosis not present

## 2015-09-22 LAB — CBC
HEMATOCRIT: 26.1 % — AB (ref 35.0–47.0)
Hemoglobin: 9.2 g/dL — ABNORMAL LOW (ref 12.0–16.0)
MCH: 32.7 pg (ref 26.0–34.0)
MCHC: 35.2 g/dL (ref 32.0–36.0)
MCV: 92.9 fL (ref 80.0–100.0)
PLATELETS: 140 10*3/uL — AB (ref 150–440)
RBC: 2.81 MIL/uL — ABNORMAL LOW (ref 3.80–5.20)
RDW: 16.8 % — AB (ref 11.5–14.5)
WBC: 2.9 10*3/uL — AB (ref 3.6–11.0)

## 2015-09-22 LAB — LACTIC ACID, PLASMA
LACTIC ACID, VENOUS: 1 mmol/L (ref 0.5–2.0)
Lactic Acid, Venous: 1.1 mmol/L (ref 0.5–2.0)

## 2015-09-22 LAB — COMPREHENSIVE METABOLIC PANEL
ALBUMIN: 2.6 g/dL — AB (ref 3.5–5.0)
ALK PHOS: 105 U/L (ref 38–126)
ALT: 19 U/L (ref 14–54)
AST: 17 U/L (ref 15–41)
Anion gap: 5 (ref 5–15)
BILIRUBIN TOTAL: 0.5 mg/dL (ref 0.3–1.2)
BUN: 20 mg/dL (ref 6–20)
CALCIUM: 9.2 mg/dL (ref 8.9–10.3)
CO2: 32 mmol/L (ref 22–32)
CREATININE: 1.46 mg/dL — AB (ref 0.44–1.00)
Chloride: 102 mmol/L (ref 101–111)
GFR calc Af Amer: 39 mL/min — ABNORMAL LOW (ref >60)
GFR, EST NON AFRICAN AMERICAN: 33 mL/min — AB (ref >60)
GLUCOSE: 128 mg/dL — AB (ref 65–99)
POTASSIUM: 3.6 mmol/L (ref 3.5–5.1)
Sodium: 139 mmol/L (ref 135–145)
TOTAL PROTEIN: 6.5 g/dL (ref 6.5–8.1)

## 2015-09-22 LAB — URINE DRUG SCREEN, QUALITATIVE (ARMC ONLY)
Amphetamines, Ur Screen: NOT DETECTED
Barbiturates, Ur Screen: NOT DETECTED
Benzodiazepine, Ur Scrn: POSITIVE — AB
COCAINE METABOLITE, UR ~~LOC~~: NOT DETECTED
Cannabinoid 50 Ng, Ur ~~LOC~~: NOT DETECTED
MDMA (ECSTASY) UR SCREEN: NOT DETECTED
METHADONE SCREEN, URINE: NOT DETECTED
OPIATE, UR SCREEN: NOT DETECTED
Phencyclidine (PCP) Ur S: NOT DETECTED
TRICYCLIC, UR SCREEN: NOT DETECTED

## 2015-09-22 LAB — URINALYSIS COMPLETE WITH MICROSCOPIC (ARMC ONLY)
BACTERIA UA: NONE SEEN
BILIRUBIN URINE: NEGATIVE
GLUCOSE, UA: NEGATIVE mg/dL
Hgb urine dipstick: NEGATIVE
Ketones, ur: NEGATIVE mg/dL
LEUKOCYTES UA: NEGATIVE
NITRITE: NEGATIVE
Protein, ur: NEGATIVE mg/dL
SPECIFIC GRAVITY, URINE: 1.006 (ref 1.005–1.030)
Squamous Epithelial / LPF: NONE SEEN
pH: 6 (ref 5.0–8.0)

## 2015-09-22 LAB — AMMONIA: AMMONIA: 18 umol/L (ref 9–35)

## 2015-09-22 LAB — VALPROIC ACID LEVEL: Valproic Acid Lvl: 53 ug/mL (ref 50.0–100.0)

## 2015-09-22 LAB — TROPONIN I: Troponin I: <0.03 ng/mL (ref <0.031)

## 2015-09-22 LAB — SALICYLATE LEVEL: Salicylate Lvl: <4 mg/dL (ref 2.8–30.0)

## 2015-09-22 LAB — ETHANOL

## 2015-09-22 LAB — BRAIN NATRIURETIC PEPTIDE: B NATRIURETIC PEPTIDE 5: 152 pg/mL — AB (ref 0.0–100.0)

## 2015-09-22 LAB — ACETAMINOPHEN LEVEL

## 2015-09-22 MED ORDER — CARVEDILOL 25 MG PO TABS: 25.0000 mg | ORAL_TABLET | Freq: Every day | ORAL | Status: DC

## 2015-09-22 MED ORDER — ASPIRIN EC 81 MG PO TBEC
81.0000 mg | DELAYED_RELEASE_TABLET | Freq: Every day | ORAL | Status: DC
  Administered 2015-09-23 – 2015-09-26 (×4): 81 mg via ORAL
  Filled 2015-09-22 (×5): qty 1

## 2015-09-22 MED ORDER — ONDANSETRON HCL 4 MG PO TABS: 4.0000 mg | ORAL_TABLET | Freq: Four times a day (QID) | ORAL | Status: DC | PRN

## 2015-09-22 MED ORDER — ONDANSETRON HCL 4 MG/2ML IJ SOLN: 4.0000 mg | Freq: Four times a day (QID) | INTRAMUSCULAR | Status: DC | PRN

## 2015-09-22 MED ORDER — LINACLOTIDE 145 MCG PO CAPS
145.0000 ug | ORAL_CAPSULE | Freq: Every day | ORAL | Status: DC | PRN
  Administered 2015-09-24: 145 ug via ORAL
  Filled 2015-09-22 (×2): qty 1

## 2015-09-22 MED ORDER — MEMANTINE HCL 5 MG PO TABS
5.0000 mg | ORAL_TABLET | Freq: Every day | ORAL | Status: DC
  Administered 2015-09-23 – 2015-09-26 (×4): 5 mg via ORAL
  Filled 2015-09-22 (×4): qty 1

## 2015-09-22 MED ORDER — RISPERIDONE 1 MG PO TABS
3.0000 mg | ORAL_TABLET | Freq: Every day | ORAL | Status: DC
  Administered 2015-09-22 – 2015-09-24 (×3): 3 mg via ORAL
  Filled 2015-09-22 (×3): qty 3

## 2015-09-22 MED ORDER — DIVALPROEX SODIUM 250 MG PO DR TAB
250.0000 mg | DELAYED_RELEASE_TABLET | Freq: Two times a day (BID) | ORAL | Status: DC
  Administered 2015-09-22 – 2015-09-24 (×4): 250 mg via ORAL
  Filled 2015-09-22 (×4): qty 1

## 2015-09-22 MED ORDER — POTASSIUM CHLORIDE CRYS ER 10 MEQ PO TBCR: 10.0000 meq | EXTENDED_RELEASE_TABLET | Freq: Every day | ORAL | Status: DC

## 2015-09-22 MED ORDER — CLONIDINE HCL 0.1 MG PO TABS: 0.1000 mg | ORAL_TABLET | Freq: Two times a day (BID) | ORAL | Status: DC

## 2015-09-22 MED ORDER — ENOXAPARIN SODIUM 40 MG/0.4ML ~~LOC~~ SOLN
40.0000 mg | SUBCUTANEOUS | Status: DC
  Administered 2015-09-22: 23:00:00 40 mg via SUBCUTANEOUS
  Filled 2015-09-22: qty 0.4

## 2015-09-22 MED ORDER — FUROSEMIDE 40 MG PO TABS
40.0000 mg | ORAL_TABLET | Freq: Two times a day (BID) | ORAL | Status: DC
  Administered 2015-09-22: 23:00:00 40 mg via ORAL
  Filled 2015-09-22: qty 1

## 2015-09-22 MED ORDER — CLONIDINE HCL 0.1 MG PO TABS
0.2000 mg | ORAL_TABLET | Freq: Two times a day (BID) | ORAL | Status: DC
  Administered 2015-09-22: 0.2 mg via ORAL
  Filled 2015-09-22: qty 2

## 2015-09-22 MED ORDER — CARVEDILOL 12.5 MG PO TABS: 25.0000 mg | ORAL_TABLET | Freq: Every day | ORAL | Status: DC

## 2015-09-22 MED ORDER — LEVOTHYROXINE SODIUM 50 MCG PO TABS
50.0000 ug | ORAL_TABLET | Freq: Every day | ORAL | Status: DC
  Administered 2015-09-24 – 2015-09-26 (×3): 50 ug via ORAL
  Filled 2015-09-22 (×3): qty 1

## 2015-09-22 MED ORDER — PANTOPRAZOLE SODIUM 40 MG PO TBEC
40.0000 mg | DELAYED_RELEASE_TABLET | Freq: Every day | ORAL | Status: DC
  Administered 2015-09-23 – 2015-09-26 (×4): 40 mg via ORAL
  Filled 2015-09-22 (×4): qty 1

## 2015-09-22 NOTE — Progress Notes (Signed)
Patient's weight is ~82 kg per RN. Calculated CrCl of ~40 mL/min to verify enoxaparin dose.  [name] 10:17 PM

## 2015-09-22 NOTE — ED Notes (Addendum)
Per [facility], patient comes from [facility] due to altered mental status. EMS reports patient was lethargic but easy to arouse upon arrival. Patients daughter said the patient called her this morning, unable to give a time, and that the patient seemed normal. Staff at [facility] noticed the patient was "more sleepy" than usual. Patient takes Alprazolam, clonidine, and trazodone. [facility] unable to state if she had all these medications today but they are on her medication list. Patients pupils 2mm but reactive bilaterally. Patient arouses to verbal stimulation but fall asleep easily. CBG 153.

## 2015-09-22 NOTE — ED Provider Notes (Signed)
Asked to monitor this patient to see if improvement isn't altered mental status. No significant improvement per daughter. On exam patient with pinpoint pupils, I have some concern for possible pontine stroke. I'll admit to the hospitalist service for further evaluation  Jene Everyobert Tashiya Souders, MD 09/22/15 1707

## 2015-09-22 NOTE — H&P (Signed)
[facility] [place] at [facility]   PATIENT NAME: [name]    MR#:  [id number]  DATE OF BIRTH:  [birth year]  DATE OF ADMISSION:  09/22/2015  PRIMARY CARE PHYSICIAN: Sherlene ShamsULLO, TERESA L, MD   REQUESTING/REFERRING PHYSICIAN: Dr Cyril LoosenKinner  CHIEF COMPLAINT:   Altered mental status HISTORY OF PRESENT ILLNESS:  [name]  is a 80 y.o. female with a known history of Diabetes, hypertension, anemia, CHF comes to the emergency room from [facility] after she was found to be having altered mental status when family visited her. Patient had spoken earlier in the day with her daughter is the past by she was more sleepy family requested patient be transferred for evaluation management. Patient opens her eyes momentarily. She appears sleepy. She is answering most basic questions. She is able to move all her extremities well. CT head was negative. Given her sudden change in altered mental status and her having history of stroke in the past patient is being admitted for rule out CVA.  PAST MEDICAL HISTORY:   Past Medical History  Diagnosis Date  . Diabetes mellitus   . GERD (gastroesophageal reflux disease)   . Hypertension   . Anemia   . Arthritis   . Hemorrhoids   . Hypertension   . CHF (congestive heart failure) (HCC)   . Renal disorder     PAST SURGICAL HISTOIRY:   Past Surgical History  Procedure Laterality Date  . Cholecystectomy    . Abdominal hysterectomy    . Back surgery    . Arthroplasty w/ arthroscopy medial / lateral compartment knee  Feb 2011    [name]  . Hemorrhoid surgery  12/19/12    SOCIAL HISTORY:   Social History  Substance Use Topics  . Smoking status: Former Smoker -- 30 years    Types: Cigarettes    Quit date: 09/28/2001  . Smokeless tobacco: Current User    Types: Snuff  . Alcohol Use: No    FAMILY HISTORY:   Family History  Problem Relation Age of Onset  . Heart disease Mother   . Hypertension Mother   . Cancer  Mother     DRUG ALLERGIES:   Allergies  Allergen Reactions  . Hydrocodone-Acetaminophen Nausea And Vomiting and Other (See Comments)    Reaction:  Dizziness   . Morphine And Related Other (See Comments)    Reaction:  Hallucinations     REVIEW OF SYSTEMS:  Review of Systems  Constitutional: Negative for fever, chills and weight loss.  HENT: Negative for ear discharge, ear pain and nosebleeds.   Eyes: Negative for blurred vision, pain and discharge.  Respiratory: Negative for sputum production, shortness of breath, wheezing and stridor.   Cardiovascular: Negative for chest pain, palpitations, orthopnea and PND.  Gastrointestinal: Negative for nausea, vomiting, abdominal pain and diarrhea.  Genitourinary: Negative for urgency and frequency.  Musculoskeletal: Negative for back pain and joint pain.  Neurological: Positive for weakness. Negative for sensory change, speech change and focal weakness.  Psychiatric/Behavioral: Negative for depression and hallucinations. The patient is not nervous/anxious.      MEDICATIONS AT HOME:   Prior to Admission medications   Medication Sig Start Date End Date Taking? Authorizing Provider  ALPRAZolam Prudy Feeler(XANAX) 0.5 MG tablet Take 1 tablet (0.5 mg total) by mouth 2 (two) times daily as needed for anxiety. 07/28/15  Yes Alford Highlandichard Wieting, MD  carvedilol (COREG) 25 MG tablet Take 1 tablet (25 mg total) by mouth 2 (two) times daily with a  meal. 07/15/15  Yes Sherlene Shams, MD  cloNIDine (CATAPRES) 0.1 MG tablet Take 1 tablet (0.1 mg total) by mouth 2 (two) times daily. 07/28/15  Yes Alford Highland, MD  divalproex (DEPAKOTE) 250 MG DR tablet Take 250 mg by mouth 2 (two) times daily.   Yes Historical Provider, MD  furosemide (LASIX) 40 MG tablet Take 1 tablet (40 mg total) by mouth daily. 07/28/15  Yes Alford Highland, MD  levothyroxine (SYNTHROID, LEVOTHROID) 50 MCG tablet Take 1 tablet (50 mcg total) by mouth daily before breakfast. 06/23/15  Yes Sherlene Shams, MD   omeprazole (PRILOSEC) 40 MG capsule Take 1 capsule (40 mg total) by mouth daily. 05/15/15 05/14/16 Yes Anne-Caroline Sharma Covert, MD  potassium chloride (K-DUR,KLOR-CON) 10 MEQ tablet Take 10 mEq by mouth daily.   Yes Historical Provider, MD  traZODone (DESYREL) 50 MG tablet Take 1 tablet (50 mg total) by mouth at bedtime. 07/28/15  Yes Alford Highland, MD  aspirin EC 81 MG tablet Take 81 mg by mouth daily.    Historical Provider, MD  LINZESS 145 MCG CAPS capsule Take 1 capsule by mouth daily as needed (constipation).  06/04/15   Historical Provider, MD  memantine (NAMENDA) 5 MG tablet Take 5 mg by mouth daily.     Historical Provider, MD  risperiDONE (RISPERDAL) 3 MG tablet Take 3 mg by mouth at bedtime.    Historical Provider, MD      VITAL SIGNS:  Blood pressure 117/41, pulse 54, resp. rate 13, SpO2 100 %.  PHYSICAL EXAMINATION:  GENERAL:  80 y.o.-year-old patient lying in the bed with no acute distress.  EYES: Pupils equal, round, reactive to light and accommodation. No scleral icterus. Extraocular muscles intact.  HEENT: Head atraumatic, normocephalic. Oropharynx and nasopharynx clear.  NECK:  Supple, no jugular venous distention. No thyroid enlargement, no tenderness.  LUNGS: Normal breath sounds bilaterally, no wheezing, rales,rhonchi or crepitation. No use of accessory muscles of respiration.  CARDIOVASCULAR: S1, S2 normal. No murmurs, rubs, or gallops.  ABDOMEN: Soft, nontender, nondistended. Bowel sounds present. No organomegaly or mass.  EXTREMITIES: No pedal edema, cyanosis, or clubbing.  NEUROLOGIC: Moves all extremities well. No focal weakness noted. Cranial nerves overall appears intact. Unable to do detailed exam second of patient being very lethargic PSYCHIATRIC: Patient is mainly sleepy wakens up momentarily mumbles a few words  SKIN: No obvious rash, lesion, or ulcer.   LABORATORY PANEL:   CBC  Recent Labs Lab 09/22/15 1335  WBC 2.9*  HGB 9.2*  HCT 26.1*  PLT 140*    ------------------------------------------------------------------------------------------------------------------  Chemistries   Recent Labs Lab 09/22/15 1335  NA 139  K 3.6  CL 102  CO2 32  GLUCOSE 128*  BUN 20  CREATININE 1.46*  CALCIUM 9.2  AST 17  ALT 19  ALKPHOS 105  BILITOT 0.5   ------------------------------------------------------------------------------------------------------------------  Cardiac Enzymes  Recent Labs Lab 09/22/15 1335  TROPONINI <0.03   ------------------------------------------------------------------------------------------------------------------  RADIOLOGY:  Dg Chest 1 View  09/22/2015  CLINICAL DATA:  Altered mental status and lethargy EXAM: CHEST 1 VIEW COMPARISON:  09/07/2014 FINDINGS: Cardiac shadow is stable. Lungs are well aerated bilaterally. No focal infiltrate or sizable effusion is noted. No acute bone abnormality is noted IMPRESSION: No active disease. Electronically Signed   By: Alcide Clever M.D.   On: 09/22/2015 15:36   Ct Head Wo Contrast  09/22/2015  CLINICAL DATA:  Lethargy EXAM: CT HEAD WITHOUT CONTRAST TECHNIQUE: Contiguous axial images were obtained from the base of the skull through the  vertex without intravenous contrast. COMPARISON:  07/27/2015 FINDINGS: Bony calvarium is intact. No gross soft tissue abnormality is seen. Mild mucosal thickening is noted within the maxillary and sphenoid sinuses. Mild atrophic changes are noted. No findings to suggest acute hemorrhage, acute infarction or space-occupying mass lesion are noted. IMPRESSION: Atrophic changes without acute abnormality. Electronically Signed   By: Alcide Clever M.D.   On: 09/22/2015 15:29    EKG:    IMPRESSION AND PLAN:  [name]  is a 80 y.o. female with a known history of Diabetes, hypertension, anemia, CHF comes to the emergency room from [facility] after she was found to be having altered mental status when family visited her. Patient had  spoken earlier in the day with her daughter is the past by she was more sleepy family requested patient be transferred for evaluation management  1. altered mental status rule out CVA -Patient has history of CVA and multiple risk factors -No source of infection identified. CT head negative. -Get MRI of the brain in the morning. -Physical therapy to see patient. -We will avoid benzodiazepines and any sedating agents including sleeping aid  2. Hypertension continue home meds  3. History of CVA On aspirin  4. DVT prophylaxis subcutaneous Lovenox  5. GERD continue PPI   All the records are reviewed and case discussed with ED provider. Management plans discussed with the patient and they are in agreement.  CODE STATUS: full  TOTAL TIME TAKING CARE OF THIS PATIENT: 50 minutes.    Labella Zahradnik M.D on 09/22/2015 at 6:33 PM  Between 7am to 6pm - Pager - [phone]  After 6pm go to [email or web address] - password [email or web address] [facility]  [facility]  Office  [phone]  CC: Primary care physician; Sherlene Shams, MD

## 2015-09-22 NOTE — ED Notes (Signed)
Patients daughter [name](POA) is requesting to have her number left.   [name] [phone].

## 2015-09-22 NOTE — ED Provider Notes (Signed)
[facility] Emergency Department Provider Note   ____________________________________________  Time seen: Approximately 2:53 PM  I have reviewed the triage vital signs and the nursing notes.   HISTORY  Chief Complaint Altered Mental Status  History Limited by altered mental status.  HPI [name] is a 80 y.o. female who comes in because her family found her sitting in a wheelchair outside at [facility] very sleepy. She family member reports she called earlier and spoke with the patient patient seen to be awake alert and was doing fine. But then she went for visit and found the patient sleeping in a wheelchair outside. No one seems to be sure why she is like this. She does take a number of medicines which could be sedating. She has not been like this in the last several weeks since not since she's been in [facility]. Patient had had at least one episode before when she was living at home without a known etiology. Patient is currently very sleepy but arousable follow commands. She is unable to provide a history however.CBG was 153.  Past Medical History  Diagnosis Date  . Diabetes mellitus   . GERD (gastroesophageal reflux disease)   . Hypertension   . Anemia   . Arthritis   . Hemorrhoids   . Hypertension   . CHF (congestive heart failure) (HCC)   . Renal disorder     Patient Active Problem List   Diagnosis Date Noted  . Rhabdomyolysis 07/24/2015  . Altered mental status 07/18/2015  . Hospital discharge follow-up 06/21/2015  . Mixed Alzheimer's and vascular dementia 06/02/2015  . Schizoaffective disorder, bipolar type (HCC) 05/17/2015  . Psychosis 05/16/2015  . CKD (chronic kidney disease) stage 3, GFR 30-59 ml/min 05/03/2015  . Noncompliance with medication treatment due to overuse of medication 01/28/2015  . Hypothyroidism 01/28/2015  . Dysuria 08/07/2014  . Back pain of thoracolumbar region 09/23/2013  . Visit for preventive  health examination 08/18/2013  . Obesity 07/07/2013  . Depression with anxiety 06/26/2013  . Hemorrhoids 11/06/2012  . Hidradenitis suppurativa 10/05/2012  . Venous insufficiency of leg 11/24/2011  . Type 2 diabetes, uncontrolled, with renal manifestation (HCC) 10/03/2011  . Anemia of chronic renal failure, stage 4 (severe) (HCC) 10/03/2011  . Chest pain with normal coronary angiography 04/21/2011  . Screening for colon cancer 04/12/2011  . Screening for breast cancer 04/12/2011  . Hypertension 04/12/2011  . Hyperlipidemia with target LDL less than 100 04/12/2011  . Screening for osteoporosis 04/12/2011    Past Surgical History  Procedure Laterality Date  . Cholecystectomy    . Abdominal hysterectomy    . Back surgery    . Arthroplasty w/ arthroscopy medial / lateral compartment knee  Feb 2011    [name]  . Hemorrhoid surgery  12/19/12    Current Outpatient Rx  Name  Route  Sig  Dispense  Refill  . ALPRAZolam (XANAX) 0.5 MG tablet   Oral   Take 1 tablet (0.5 mg total) by mouth 2 (two) times daily as needed for anxiety.   60 tablet   0   . aspirin EC 81 MG tablet   Oral   Take 81 mg by mouth daily.         . carvedilol (COREG) 25 MG tablet   Oral   Take 1 tablet (25 mg total) by mouth 2 (two) times daily with a meal.   60 tablet   2   . cloNIDine (CATAPRES) 0.1 MG tablet  Oral   Take 1 tablet (0.1 mg total) by mouth 2 (two) times daily.   60 tablet   1   . divalproex (DEPAKOTE) 500 MG DR tablet   Oral   Take by mouth 2 (two) times daily.          . furosemide (LASIX) 40 MG tablet   Oral   Take 1 tablet (40 mg total) by mouth daily.   30 tablet      . levothyroxine (SYNTHROID, LEVOTHROID) 50 MCG tablet   Oral   Take 1 tablet (50 mcg total) by mouth daily before breakfast.   90 tablet   1   . LINZESS 145 MCG CAPS capsule   Oral   Take 1 capsule by mouth daily as needed (constipation).            Dispense as written.   . memantine (NAMENDA) 5  MG tablet   Oral   Take 5 mg by mouth daily.          Marland Kitchen omeprazole (PRILOSEC) 40 MG capsule   Oral   Take 1 capsule (40 mg total) by mouth daily.   30 capsule   0   . potassium chloride SA (K-DUR,KLOR-CON) 10 MEQ tablet   Oral   Take 1 tablet (10 mEq total) by mouth daily. For 5 days         . risperiDONE (RISPERDAL) 3 MG tablet   Oral   Take 3 mg by mouth at bedtime.         . traZODone (DESYREL) 50 MG tablet   Oral   Take 1 tablet (50 mg total) by mouth at bedtime.   30 tablet        Allergies Hydrocodone-acetaminophen and Morphine and related  Family History  Problem Relation Age of Onset  . Heart disease Mother   . Hypertension Mother   . Cancer Mother     Social History Social History  Substance Use Topics  . Smoking status: Former Smoker -- 30 years    Types: Cigarettes    Quit date: 09/28/2001  . Smokeless tobacco: Current User    Types: Snuff  . Alcohol Use: No    Review of Systems Constitutional: No fever/chills Eyes: No visual changes. ENT: No sore throat. Cardiovascular: Denies chest pain. Respiratory: Denies shortness of breath. Gastrointestinal: No abdominal pain.  No nausea, no vomiting.  No diarrhea.  No constipation. Genitourinary: Negative for dysuria. Musculoskeletal: Negative for back pain. Skin: Negative for rash. Neurological: Negative for headaches, focal weakness or numbness.  10-point ROS otherwise negative.  ____________________________________________   PHYSICAL EXAM:  VITAL SIGNS: ED Triage Vitals  Enc Vitals Group     BP 09/22/15 1321 128/45 mmHg     Pulse Rate 09/22/15 1321 56     Resp 09/22/15 1321 12     Temp --      Temp src --      SpO2 09/22/15 1321 99 %     Weight --      Height --      Head Cir --      [facility] --      Pain Score --      Pain Loc --      Pain Edu? --      Excl. in GC? --     Constitutional: Sleepy but arousable. Eyes: Conjunctivae are normal. PERRL. EOMI. Head:  Atraumatic. Nose: No congestion/rhinnorhea. Mouth/Throat: Mucous membranes are moist.  Oropharynx non-erythematous. Neck: No stridor.  Cardiovascular: Normal rate, regular rhythm. Grossly normal heart sounds.  Good peripheral circulation. Respiratory: Normal respiratory effort.  No retractions. Lungs CTAB. Gastrointestinal: Soft and nontender. No distention. No abdominal bruits. No CVA tenderness. Musculoskeletal: No lower extremity tenderness nor edema.  No joint effusions. Neurologic:  Normal speech and language just very faint and she falls asleep again afterwards.. No gross focal neurologic deficits are appreciated.  Skin:  Skin is warm, dry and intact. No rash noted.   ____________________________________________   LABS (all labs ordered are listed, but only abnormal results are displayed)  Labs Reviewed  COMPREHENSIVE METABOLIC PANEL - Abnormal; Notable for the following:    Glucose, Bld 128 (*)    Creatinine, Ser 1.46 (*)    Albumin 2.6 (*)    GFR calc non Af Amer 33 (*)    GFR calc Af Amer 39 (*)    All other components within normal limits  CBC - Abnormal; Notable for the following:    WBC 2.9 (*)    RBC 2.81 (*)    Hemoglobin 9.2 (*)    HCT 26.1 (*)    RDW 16.8 (*)    Platelets 140 (*)    All other components within normal limits  BRAIN NATRIURETIC PEPTIDE - Abnormal; Notable for the following:    B Natriuretic Peptide 152.0 (*)    All other components within normal limits  AMMONIA  ETHANOL  SALICYLATE LEVEL  URINALYSIS COMPLETEWITH MICROSCOPIC ([facility] ONLY)  URINE DRUG SCREEN, QUALITATIVE ([facility] ONLY)  TROPONIN I  LACTIC ACID, PLASMA  LACTIC ACID, PLASMA  ACETAMINOPHEN LEVEL  CBG MONITORING, ED   ____________________________________________  EKG  EKG read and interpreted by me shows sinus bradycardia rate of 52 first-degree AV block no acute ST-T wave changes ____________________________________________  RADIOLOGY chest x-ray and head CT read by  radiology as no acute disease ____________________________________________   PROCEDURES    ____________________________________________   INITIAL IMPRESSION / ASSESSMENT AND PLAN / ED COURSE  Pertinent labs & imaging results that were available during my care of the patient were reviewed by me and considered in my medical decision making (see chart for details).  ----------------------------------------- 3:56 PM on 09/22/2015 -----------------------------------------  At this time patient is more awake and alert. She is talking with minimal stimulation. Her were just a little bit thicker slurry. She is moving all extremities equally and well. I will plan on watching her for somewhat longer period of time to see if she continues to improve. I will sign her out to Dr. Cyril Loosen to do this. Plan is to admit her if she does not get back to baseline and if she does get Activase probably send her back home. ____________________________________________   FINAL CLINICAL IMPRESSION(S) / ED DIAGNOSES  Final diagnoses:  Altered mental status      NEW MEDICATIONS STARTED DURING THIS VISIT:  New Prescriptions   No medications on file     Note:  This document was prepared using Dragon voice recognition software and may include unintentional dictation errors.    Arnaldo Natal, MD 09/22/15 [phone]

## 2015-09-22 NOTE — ED Notes (Signed)
Patient transported to CT 

## 2015-09-23 ENCOUNTER — Inpatient Hospital Stay: Payer: Medicare Other

## 2015-09-23 ENCOUNTER — Inpatient Hospital Stay
Admit: 2015-09-23 | Discharge: 2015-09-23 | Disposition: A | Discharge status: Home / Self Care | Payer: Medicare Other | Attending: Internal Medicine | Admitting: Internal Medicine

## 2015-09-23 DIAGNOSIS — M1990 Unspecified osteoarthritis, unspecified site: Secondary | ICD-10-CM

## 2015-09-23 DIAGNOSIS — I509 Heart failure, unspecified: Secondary | ICD-10-CM

## 2015-09-23 DIAGNOSIS — R4182 Altered mental status, unspecified: Secondary | ICD-10-CM

## 2015-09-23 DIAGNOSIS — Z79899 Other long term (current) drug therapy: Secondary | ICD-10-CM

## 2015-09-23 DIAGNOSIS — E119 Type 2 diabetes mellitus without complications: Secondary | ICD-10-CM

## 2015-09-23 DIAGNOSIS — D61818 Other pancytopenia: Secondary | ICD-10-CM

## 2015-09-23 DIAGNOSIS — I10 Essential (primary) hypertension: Secondary | ICD-10-CM

## 2015-09-23 LAB — BLOOD GAS, ARTERIAL
ALLENS TEST (PASS/FAIL): POSITIVE — AB
Acid-Base Excess: 7.9 mmol/L — ABNORMAL HIGH (ref 0.0–3.0)
Bicarbonate: 33.3 mEq/L — ABNORMAL HIGH (ref 21.0–28.0)
FIO2: 0.21
O2 SAT: 89.9 %
PATIENT TEMPERATURE: 37
PO2 ART: 56 mmHg — AB (ref 83.0–108.0)
pCO2 arterial: 49 mmHg — ABNORMAL HIGH (ref 32.0–48.0)
pH, Arterial: 7.44 (ref 7.350–7.450)

## 2015-09-23 LAB — DIFFERENTIAL
Band Neutrophils: 0 %
Basophils Absolute: 0 10*3/uL (ref 0–0.1)
Basophils Relative: 0 %
Blasts: 0 %
EOS ABS: 0 10*3/uL (ref 0–0.7)
EOS PCT: 0 %
LYMPHS ABS: 1 10*3/uL (ref 1.0–3.6)
Lymphocytes Relative: 32 %
MONOS PCT: 8 %
MYELOCYTES: 0 %
Metamyelocytes Relative: 0 %
Monocytes Absolute: 0.2 10*3/uL (ref 0.2–0.9)
NEUTROS ABS: 1.8 10*3/uL (ref 1.4–6.5)
NRBC: 0 /100{WBCs}
Neutrophils Relative %: 59 %
OTHER: 0 %
PROMYELOCYTES ABS: 1 %

## 2015-09-23 LAB — PROTIME-INR
INR: 1.08
PROTHROMBIN TIME: 14.2 s (ref 11.4–15.0)

## 2015-09-23 LAB — LACTIC ACID, PLASMA: Lactic Acid, Venous: 1.6 mmol/L (ref 0.5–2.0)

## 2015-09-23 LAB — CBC
HCT: 23.1 % — ABNORMAL LOW (ref 35.0–47.0)
Hemoglobin: 8.1 g/dL — ABNORMAL LOW (ref 12.0–16.0)
MCH: 32.6 pg (ref 26.0–34.0)
MCHC: 35 g/dL (ref 32.0–36.0)
MCV: 93.1 fL (ref 80.0–100.0)
PLATELETS: 122 10*3/uL — AB (ref 150–440)
RBC: 2.48 MIL/uL — ABNORMAL LOW (ref 3.80–5.20)
RDW: 16.7 % — AB (ref 11.5–14.5)
WBC: 1.9 10*3/uL — AB (ref 3.6–11.0)

## 2015-09-23 LAB — ECHOCARDIOGRAM COMPLETE
Height: 67 in
Weight: 2885 oz

## 2015-09-23 LAB — COMPREHENSIVE METABOLIC PANEL
ALT: 17 U/L (ref 14–54)
ANION GAP: 3 — AB (ref 5–15)
AST: 19 U/L (ref 15–41)
Albumin: 2.2 g/dL — ABNORMAL LOW (ref 3.5–5.0)
Alkaline Phosphatase: 84 U/L (ref 38–126)
BUN: 18 mg/dL (ref 6–20)
CHLORIDE: 104 mmol/L (ref 101–111)
CO2: 32 mmol/L (ref 22–32)
Calcium: 8.7 mg/dL — ABNORMAL LOW (ref 8.9–10.3)
Creatinine, Ser: 1.43 mg/dL — ABNORMAL HIGH (ref 0.44–1.00)
GFR, EST AFRICAN AMERICAN: 40 mL/min — AB (ref >60)
GFR, EST NON AFRICAN AMERICAN: 34 mL/min — AB (ref >60)
Glucose, Bld: 184 mg/dL — ABNORMAL HIGH (ref 65–99)
POTASSIUM: 3.4 mmol/L — AB (ref 3.5–5.1)
Sodium: 139 mmol/L (ref 135–145)
Total Bilirubin: 0.4 mg/dL (ref 0.3–1.2)
Total Protein: 5.5 g/dL — ABNORMAL LOW (ref 6.5–8.1)

## 2015-09-23 LAB — IRON AND TIBC
Iron: 81 ug/dL (ref 28–170)
SATURATION RATIOS: 27 % (ref 10.4–31.8)
TIBC: 296 ug/dL (ref 250–450)
UIBC: 215 ug/dL

## 2015-09-23 LAB — APTT: APTT: 36 s (ref 24–36)

## 2015-09-23 LAB — FOLATE: Folate: 9.1 ng/mL (ref >5.9)

## 2015-09-23 LAB — TROPONIN I: Troponin I: <0.03 ng/mL (ref <0.031)

## 2015-09-23 LAB — FERRITIN: Ferritin: 63 ng/mL (ref 11–307)

## 2015-09-23 LAB — TSH: TSH: 2.04 u[IU]/mL (ref 0.350–4.500)

## 2015-09-23 LAB — MRSA PCR SCREENING: MRSA by PCR: NEGATIVE

## 2015-09-23 LAB — VALPROIC ACID LEVEL: VALPROIC ACID LVL: 36 ug/mL — AB (ref 50.0–100.0)

## 2015-09-23 LAB — GLUCOSE, CAPILLARY: Glucose-Capillary: 166 mg/dL — ABNORMAL HIGH (ref 65–99)

## 2015-09-23 MED ORDER — POTASSIUM CHLORIDE 10 MEQ/100ML IV SOLN
10.0000 meq | INTRAVENOUS | Status: AC
  Administered 2015-09-23 (×2): 10 meq via INTRAVENOUS
  Filled 2015-09-23 (×2): qty 100

## 2015-09-23 MED ORDER — SODIUM CHLORIDE 0.9 % IV BOLUS (SEPSIS)
250.0000 mL | Freq: Once | INTRAVENOUS | Status: AC
  Administered 2015-09-23: 02:00:00 250 mL via INTRAVENOUS

## 2015-09-23 MED ORDER — PIPERACILLIN-TAZOBACTAM 3.375 G IVPB
3.3750 g | Freq: Three times a day (TID) | INTRAVENOUS | Status: DC
  Administered 2015-09-23 (×2): 3.375 g via INTRAVENOUS
  Filled 2015-09-23 (×4): qty 50

## 2015-09-23 MED ORDER — DEXAMETHASONE SODIUM PHOSPHATE 4 MG/ML IJ SOLN
4.0000 mg | Freq: Three times a day (TID) | INTRAMUSCULAR | Status: DC
  Administered 2015-09-23 – 2015-09-25 (×6): 4 mg via INTRAVENOUS
  Filled 2015-09-23 (×8): qty 1

## 2015-09-23 MED ORDER — VANCOMYCIN HCL IN DEXTROSE 1-5 GM/200ML-% IV SOLN
1000.0000 mg | INTRAVENOUS | Status: DC
  Administered 2015-09-23: 14:00:00 1000 mg via INTRAVENOUS
  Filled 2015-09-23 (×2): qty 200

## 2015-09-23 MED ORDER — CLONIDINE HCL 0.1 MG PO TABS
0.2000 mg | ORAL_TABLET | Freq: Two times a day (BID) | ORAL | Status: DC
  Administered 2015-09-23 – 2015-09-26 (×6): 0.2 mg via ORAL
  Filled 2015-09-23 (×7): qty 2

## 2015-09-23 MED ORDER — CARVEDILOL 12.5 MG PO TABS
25.0000 mg | ORAL_TABLET | Freq: Two times a day (BID) | ORAL | Status: DC
  Administered 2015-09-23 – 2015-09-26 (×7): 25 mg via ORAL
  Filled 2015-09-23 (×7): qty 2

## 2015-09-23 MED ORDER — DEXTROSE 5 % IV SOLN
2.0000 g | Freq: Two times a day (BID) | INTRAVENOUS | Status: DC
  Administered 2015-09-23 – 2015-09-24 (×4): 2 g via INTRAVENOUS
  Filled 2015-09-23 (×5): qty 2

## 2015-09-23 MED ORDER — VANCOMYCIN HCL IN DEXTROSE 1-5 GM/200ML-% IV SOLN
1000.0000 mg | Freq: Once | INTRAVENOUS | Status: AC
  Administered 2015-09-23: 1000 mg via INTRAVENOUS
  Filled 2015-09-23: qty 200

## 2015-09-23 NOTE — Consult Note (Signed)
Reason for Consult:Altered mental status Referring Physician: Winona Legato  CC: Lethargy  HPI: [name] is an 80 y.o. female who has no family present to provide history.  All history obtained from the chart.  Patient was brought to the ED on yesterday after her family visited her and felt she was more sleepy.  Family requested patient be transferred for evaluation management. Patient had at least one episode before when she was living at home without a known etiology.  Blood sugar on presentation was normal.    Past Medical History  Diagnosis Date  . Diabetes mellitus   . GERD (gastroesophageal reflux disease)   . Hypertension   . Anemia   . Arthritis   . Hemorrhoids   . Hypertension   . CHF (congestive heart failure) (HCC)   . Renal disorder     Past Surgical History  Procedure Laterality Date  . Cholecystectomy    . Abdominal hysterectomy    . Back surgery    . Arthroplasty w/ arthroscopy medial / lateral compartment knee  Feb 2011    [name]  . Hemorrhoid surgery  12/19/12    Family History  Problem Relation Age of Onset  . Heart disease Mother   . Hypertension Mother   . Cancer Mother     Social History:  reports that she quit smoking about 13 years ago. Her smoking use included Cigarettes. She quit after 30 years of use. Her smokeless tobacco use includes Snuff. She reports that she does not drink alcohol or use illicit drugs.  Allergies  Allergen Reactions  . Hydrocodone-Acetaminophen Nausea And Vomiting and Other (See Comments)    Reaction:  Dizziness   . Morphine And Related Other (See Comments)    Reaction:  Hallucinations     Medications:  I have reviewed the patient's current medications. Prior to Admission:  Prescriptions prior to admission  Medication Sig Dispense Refill Last Dose  . ALPRAZolam (XANAX) 0.5 MG tablet Take 1 tablet (0.5 mg total) by mouth 2 (two) times daily as needed for anxiety. 60 tablet 0 09/22/2015 at Unknown time  . aspirin EC 81  MG tablet Take 81 mg by mouth daily.   09/22/2015 at 0800  . carvedilol (COREG) 25 MG tablet Take 25 mg by mouth daily with breakfast.   09/22/2015 at 0800  . cloNIDine (CATAPRES) 0.2 MG tablet Take 0.2 mg by mouth 2 (two) times daily.   09/22/2015 at Unknown time  . divalproex (DEPAKOTE) 250 MG DR tablet Take 250 mg by mouth 2 (two) times daily.   09/22/2015 at Unknown time  . furosemide (LASIX) 40 MG tablet Take 40 mg by mouth 2 (two) times daily.   09/22/2015 at Unknown time  . levothyroxine (SYNTHROID, LEVOTHROID) 50 MCG tablet Take 1 tablet (50 mcg total) by mouth daily before breakfast. 90 tablet 1 09/22/2015 at Unknown time  . LINZESS 145 MCG CAPS capsule Take 145 mcg by mouth daily before breakfast.    09/22/2015 at Unknown time  . memantine (NAMENDA) 5 MG tablet Take 5 mg by mouth daily.    09/22/2015 at Unknown time  . omeprazole (PRILOSEC) 40 MG capsule Take 1 capsule (40 mg total) by mouth daily. 30 capsule 0 09/22/2015 at Unknown time  . risperiDONE (RISPERDAL) 3 MG tablet Take 3 mg by mouth at bedtime.   09/21/2015 at Unknown time  . traZODone (DESYREL) 50 MG tablet Take 25 mg by mouth at bedtime.   09/21/2015 at Unknown time   Scheduled: . aspirin  EC  81 mg Oral Daily  . carvedilol  25 mg Oral BID WC  . cloNIDine  0.2 mg Oral BID  . divalproex  250 mg Oral BID  . enoxaparin (LOVENOX) injection  40 mg Subcutaneous Q24H  . levothyroxine  50 mcg Oral Q0600  . memantine  5 mg Oral Daily  . pantoprazole  40 mg Oral Daily  . piperacillin-tazobactam (ZOSYN)  IV  3.375 g Intravenous Q8H  . risperiDONE  3 mg Oral QHS  . vancomycin  1,000 mg Intravenous Q24H    ROS: History obtained from the patient  General ROS: negative for - chills, fatigue, fever, night sweats, weight gain or weight loss Psychological ROS: negative for - behavioral disorder, hallucinations, memory difficulties, mood swings or suicidal ideation Ophthalmic ROS: negative for - blurry vision, double vision, eye pain or loss  of vision ENT ROS: negative for - epistaxis, nasal discharge, oral lesions, sore throat, tinnitus or vertigo Allergy and Immunology ROS: negative for - hives or itchy/watery eyes Hematological and Lymphatic ROS: negative for - bleeding problems, bruising or swollen lymph nodes Endocrine ROS: negative for - galactorrhea, hair pattern changes, polydipsia/polyuria or temperature intolerance Respiratory ROS: negative for - cough, hemoptysis, shortness of breath or wheezing Cardiovascular ROS: negative for - chest pain, dyspnea on exertion, edema or irregular heartbeat Gastrointestinal ROS: negative for - abdominal pain, diarrhea, hematemesis, nausea/vomiting or stool incontinence Genito-Urinary ROS: negative for - dysuria, hematuria, incontinence or urinary frequency/urgency Musculoskeletal ROS: negative for - joint swelling or muscular weakness Neurological ROS: as noted in HPI Dermatological ROS: negative for rash and skin lesion changes  Physical Examination: Blood pressure 131/29, pulse 68, temperature 97.8 F (36.6 C), temperature source Oral, resp. rate 18, height 5\' 7"  (1.702 m), weight 81.789 kg (180 lb 5 oz), SpO2 100 %.  HEENT-  Normocephalic, no lesions, without obvious abnormality.  Normal external eye and conjunctiva.  Normal TM's bilaterally.  Normal auditory canals and external ears. Normal external nose, mucus membranes and septum.  Normal pharynx. Cardiovascular- S1, S2 normal, pulses palpable throughout   Lungs- chest clear, no wheezing, rales, normal symmetric air entry Abdomen- soft, non-tender; bowel sounds normal; no masses,  no organomegaly Extremities- mild lower extremity edema Lymph-no adenopathy palpable Musculoskeletal-no joint tenderness, deformity or swelling. No neck pain with flexion or movement from side-to-side.   Skin-warm and dry, no hyperpigmentation, vitiligo, or suspicious lesions  Neurological Examination Mental Status: Alert, oriented, thought content  appropriate.  Speech fluent without evidence of aphasia.  Able to follow 3 step commands without difficulty.  Some bradykinesia noted.   Cranial Nerves: II: Discs flat bilaterally; Visual fields grossly normal, pupils equal, round, reactive to light and accommodation III,IV, VI: ptosis not present, extra-ocular motions intact bilaterally V,VII: smile symmetric, facial light touch sensation normal bilaterally VIII: hearing normal bilaterally IX,X: gag reflex present XI: bilateral shoulder shrug XII: midline tongue extension Motor: Able to lift all extremities against gravity with no focal weakness noted.   Sensory: Pinprick and light touch intact throughout, bilaterally Deep Tendon Reflexes: 2+ in the upper extremities and absent in the lower extremities.   Plantars: Right: downgoing   Left: downgoing Cerebellar: Normal finger-to-nose and normal heel-to-shin testing bilaterally   Laboratory Studies:   Basic Metabolic Panel:  Recent Labs Lab 09/22/15 1335 09/23/15 0200  NA 139 139  K 3.6 3.4*  CL 102 104  CO2 32 32  GLUCOSE 128* 184*  BUN 20 18  CREATININE 1.46* 1.43*  CALCIUM 9.2 8.7*  Liver Function Tests:  Recent Labs Lab 09/22/15 1335 09/23/15 0200  AST 17 19  ALT 19 17  ALKPHOS 105 84  BILITOT 0.5 0.4  PROT 6.5 5.5*  ALBUMIN 2.6* 2.2*   No results for input(s): LIPASE, AMYLASE in the last 168 hours.  Recent Labs Lab 09/22/15 1416  AMMONIA 18    CBC:  Recent Labs Lab 09/22/15 1335 09/23/15 0200  WBC 2.9* 1.9*  HGB 9.2* 8.1*  HCT 26.1* 23.1*  MCV 92.9 93.1  PLT 140* 122*    Cardiac Enzymes:  Recent Labs Lab 09/22/15 1335 09/23/15 0200  TROPONINI <0.03 <0.03    BNP: Invalid input(s): POCBNP  CBG:  Recent Labs Lab 09/23/15 0122  GLUCAP 166*    Microbiology: Results for orders placed or performed during the hospital encounter of 09/22/15  Culture, blood (Routine X 2) w Reflex to ID Panel     Status: None (Preliminary result)    Collection Time: 09/22/15 10:13 PM  Result Value Ref Range Status   Specimen Description BLOOD RIGHT WRIST  Final   Special Requests BOTTLES DRAWN AEROBIC AND ANAEROBIC 5ML  Final   Culture NO GROWTH < 12 HOURS  Final   Report Status PENDING  Incomplete  Culture, blood (Routine X 2) w Reflex to ID Panel     Status: None (Preliminary result)   Collection Time: 09/22/15 11:45 PM  Result Value Ref Range Status   Specimen Description BLOOD RIGHT WRIST  Final   Special Requests BOTTLES DRAWN AEROBIC AND ANAEROBIC 5ML  Final   Culture NO GROWTH < 12 HOURS  Final   Report Status PENDING  Incomplete  MRSA PCR Screening     Status: None   Collection Time: 09/23/15 12:10 AM  Result Value Ref Range Status   MRSA by PCR NEGATIVE NEGATIVE Final    Comment:        The GeneXpert MRSA Assay (FDA approved for NASAL specimens only), is one component of a comprehensive MRSA colonization surveillance program. It is not intended to diagnose MRSA infection nor to guide or monitor treatment for MRSA infections.     Coagulation Studies: No results for input(s): LABPROT, INR in the last 72 hours.  Urinalysis:  Recent Labs Lab 09/22/15 1539  COLORURINE STRAW*  LABSPEC 1.006  PHURINE 6.0  GLUCOSEU NEGATIVE  HGBUR NEGATIVE  BILIRUBINUR NEGATIVE  KETONESUR NEGATIVE  PROTEINUR NEGATIVE  NITRITE NEGATIVE  LEUKOCYTESUR NEGATIVE    Lipid Panel:     Component Value Date/Time   CHOL 123 10/24/2014 1007   TRIG 85.0 10/24/2014 1007   HDL 38.40* 10/24/2014 1007   CHOLHDL 3 10/24/2014 1007   VLDL 17.0 10/24/2014 1007   LDLCALC 68 10/24/2014 1007    HgbA1C:  Lab Results  Component Value Date   HGBA1C 7.3* 04/30/2015    Urine Drug Screen:     Component Value Date/Time   LABOPIA NONE DETECTED 09/22/2015 1539   COCAINSCRNUR NONE DETECTED 09/22/2015 1539   LABBENZ POSITIVE* 09/22/2015 1539   AMPHETMU NONE DETECTED 09/22/2015 1539   THCU NONE DETECTED 09/22/2015 1539   LABBARB NONE  DETECTED 09/22/2015 1539    Alcohol Level:  Recent Labs Lab 09/22/15 1335  ETH <5    Other results: EKG: sinus rhythm at 60 bpm, 1st degree AV block.  Imaging: Dg Chest 1 View  09/22/2015  CLINICAL DATA:  Altered mental status and lethargy EXAM: CHEST 1 VIEW COMPARISON:  09/07/2014 FINDINGS: Cardiac shadow is stable. Lungs are well aerated bilaterally. No focal infiltrate  or sizable effusion is noted. No acute bone abnormality is noted IMPRESSION: No active disease. Electronically Signed   By: Alcide Clever M.D.   On: 09/22/2015 15:36   Ct Head Wo Contrast  09/23/2015  CLINICAL DATA:  Unresponsive. EXAM: CT HEAD WITHOUT CONTRAST TECHNIQUE: Contiguous axial images were obtained from the base of the skull through the vertex without intravenous contrast. COMPARISON:  Head CT 12 hours prior.  Brain MRI 07/27/2015 FINDINGS: No intracranial hemorrhage or findings of involving ischemia. No change from prior exam. Unchanged atrophy and chronic small vessel ischemic change. No subdural or extra-axial collection. Opacification of both maxillary sinuses with fluid levels. Opacification of scattered ethmoid air cells, and sphenoid sinus mucosal thickening. No calvarial fracture. IMPRESSION: 1. No acute intracranial abnormality. No hemorrhage or evidence of a evolving ischemia compared to prior exam. 2. Maxillary sinus opacification with fluid levels, may be acute sinusitis. Electronically Signed   By: Rubye Oaks M.D.   On: 09/23/2015 02:28   Ct Head Wo Contrast  09/22/2015  CLINICAL DATA:  Lethargy EXAM: CT HEAD WITHOUT CONTRAST TECHNIQUE: Contiguous axial images were obtained from the base of the skull through the vertex without intravenous contrast. COMPARISON:  07/27/2015 FINDINGS: Bony calvarium is intact. No gross soft tissue abnormality is seen. Mild mucosal thickening is noted within the maxillary and sphenoid sinuses. Mild atrophic changes are noted. No findings to suggest acute hemorrhage,  acute infarction or space-occupying mass lesion are noted. IMPRESSION: Atrophic changes without acute abnormality. Electronically Signed   By: Alcide Clever M.D.   On: 09/22/2015 15:29     Assessment/Plan: 80 year old female presenting after being found lethargic by family.  Has had a similar episode in the past.   Currently no focal findings on neurological examination.  Bradykinesia likely related to medications.  MRI of the brain personally reviewed and shows some layering in the lateral ventricles.  Concern was for neck stiffness, as well, of which I find none at this time on neurological examination.  Due to MRI results there does remain some concern for infection. Clinically this does not appear to be the case but will investigate further since with evidence of myelosuppression and may not have a typical presentation.    Recommendations: 1.  Patient to have LP.  Consent to be signed.  LP tray to bedside.  Lovenox to be discontinued with SCD's placed.  PT/PTT and INR. 2.  EEG     Thana Farr, MD Neurology [phone] 09/23/2015, 2:03 PM

## 2015-09-23 NOTE — Progress Notes (Signed)
[name] is an 80 y.o. female.   Chief Complaint  Patient presents with  . Altered Mental Status   HPI: Patient admitted with altered mental status from long term care facility, [facility]. Daughter found patient unattended and unresponsive outside. She was brought to ED for further evaluation. Concerning for CVA as patient has previous history of vascular dementia, HTN, anemia. Most of history obtained from chart as well as daughter, Coretta. Noted to have mild pancytopenia.   Past Medical History: Past Medical History  Diagnosis Date  . Diabetes mellitus   . GERD (gastroesophageal reflux disease)   . Hypertension   . Anemia   . Arthritis   . Hemorrhoids   . Hypertension   . CHF (congestive heart failure) ([place])   . Renal disorder     Past Surgical History: Past Surgical History  Procedure Laterality Date  . Cholecystectomy    . Abdominal hysterectomy    . Back surgery    . Arthroplasty w/ arthroscopy medial / lateral compartment knee  Feb 2011    [name]  . Hemorrhoid surgery  12/19/12    Family History: Family History  Problem Relation Age of Onset  . Heart disease Mother   . Hypertension Mother   . Cancer Mother     Social History:  reports that she quit smoking about 13 years ago. Her smoking use included Cigarettes. She quit after 30 years of use. Her smokeless tobacco use includes Snuff. She reports that she does not drink alcohol or use illicit drugs.  Allergies:  Allergies  Allergen Reactions  . Hydrocodone-Acetaminophen Nausea And Vomiting and Other (See Comments)    Reaction:  Dizziness   . Morphine And Related Other (See Comments)    Reaction:  Hallucinations     Medications Prior to Admission  Medication Sig Dispense Refill  . ALPRAZolam (XANAX) 0.5 MG tablet Take 1 tablet (0.5 mg total) by mouth 2 (two) times daily as needed for anxiety. 60 tablet 0  . aspirin EC 81 MG tablet Take 81 mg by mouth daily.    . carvedilol (COREG) 25 MG tablet  Take 25 mg by mouth daily with breakfast.    . cloNIDine (CATAPRES) 0.2 MG tablet Take 0.2 mg by mouth 2 (two) times daily.    . divalproex (DEPAKOTE) 250 MG DR tablet Take 250 mg by mouth 2 (two) times daily.    . furosemide (LASIX) 40 MG tablet Take 40 mg by mouth 2 (two) times daily.    Marland Kitchen levothyroxine (SYNTHROID, LEVOTHROID) 50 MCG tablet Take 1 tablet (50 mcg total) by mouth daily before breakfast. 90 tablet 1  . LINZESS 145 MCG CAPS capsule Take 145 mcg by mouth daily before breakfast.     . memantine (NAMENDA) 5 MG tablet Take 5 mg by mouth daily.     Marland Kitchen omeprazole (PRILOSEC) 40 MG capsule Take 1 capsule (40 mg total) by mouth daily. 30 capsule 0  . risperiDONE (RISPERDAL) 3 MG tablet Take 3 mg by mouth at bedtime.    . traZODone (DESYREL) 50 MG tablet Take 25 mg by mouth at bedtime.      Results for orders placed or performed during the hospital encounter of 09/22/15 (from the past 48 hour(s))  Comprehensive metabolic panel     Status: Abnormal   Collection Time: 09/22/15  1:35 PM  Result Value Ref Range   Sodium 139 135 - 145 mmol/L   Potassium 3.6 3.5 - 5.1 mmol/L   Chloride 102 101 -  111 mmol/L   CO2 32 22 - 32 mmol/L   Glucose, Bld 128 (H) 65 - 99 mg/dL   BUN 20 6 - 20 mg/dL   Creatinine, Ser 1.46 (H) 0.44 - 1.00 mg/dL   Calcium 9.2 8.9 - 10.3 mg/dL   Total Protein 6.5 6.5 - 8.1 g/dL   Albumin 2.6 (L) 3.5 - 5.0 g/dL   AST 17 15 - 41 U/L   ALT 19 14 - 54 U/L   Alkaline Phosphatase 105 38 - 126 U/L   Total Bilirubin 0.5 0.3 - 1.2 mg/dL   GFR calc non Af Amer 33 (L) >60 mL/min   GFR calc Af Amer 39 (L) >60 mL/min    Comment: (NOTE) The eGFR has been calculated using the CKD EPI equation. This calculation has not been validated in all clinical situations. eGFR's persistently <60 mL/min signify possible Chronic Kidney Disease.    Anion gap 5 5 - 15  CBC     Status: Abnormal   Collection Time: 09/22/15  1:35 PM  Result Value Ref Range   WBC 2.9 (L) 3.6 - 11.0 K/uL    RBC 2.81 (L) 3.80 - 5.20 MIL/uL   Hemoglobin 9.2 (L) 12.0 - 16.0 g/dL   HCT 26.1 (L) 35.0 - 47.0 %   MCV 92.9 80.0 - 100.0 fL   MCH 32.7 26.0 - 34.0 pg   MCHC 35.2 32.0 - 36.0 g/dL   RDW 16.8 (H) 11.5 - 14.5 %   Platelets 140 (L) 150 - 440 K/uL  Troponin I     Status: None   Collection Time: 09/22/15  1:35 PM  Result Value Ref Range   Troponin I <0.03 <0.031 ng/mL    Comment:        NO INDICATION OF MYOCARDIAL INJURY.   Brain natriuretic peptide     Status: Abnormal   Collection Time: 09/22/15  1:35 PM  Result Value Ref Range   B Natriuretic Peptide 152.0 (H) 0.0 - 100.0 pg/mL  Ethanol     Status: None   Collection Time: 09/22/15  1:35 PM  Result Value Ref Range   Alcohol, Ethyl (B) <5 <5 mg/dL    Comment:        LOWEST DETECTABLE LIMIT FOR SERUM ALCOHOL IS 5 mg/dL FOR MEDICAL PURPOSES ONLY   Acetaminophen level     Status: Abnormal   Collection Time: 09/22/15  1:35 PM  Result Value Ref Range   Acetaminophen (Tylenol), Serum <10 (L) 10 - 30 ug/mL    Comment:        THERAPEUTIC CONCENTRATIONS VARY SIGNIFICANTLY. A RANGE OF 10-30 ug/mL MAY BE AN EFFECTIVE CONCENTRATION FOR MANY PATIENTS. HOWEVER, SOME ARE BEST TREATED AT CONCENTRATIONS OUTSIDE THIS RANGE. ACETAMINOPHEN CONCENTRATIONS >150 ug/mL AT 4 HOURS AFTER INGESTION AND >50 ug/mL AT 12 HOURS AFTER INGESTION ARE OFTEN ASSOCIATED WITH TOXIC REACTIONS.   Salicylate level     Status: None   Collection Time: 09/22/15  1:35 PM  Result Value Ref Range   Salicylate Lvl <2.5 2.8 - 30.0 mg/dL  Valproic acid level     Status: None   Collection Time: 09/22/15  1:35 PM  Result Value Ref Range   Valproic Acid Lvl 53 50.0 - 100.0 ug/mL  Ammonia     Status: None   Collection Time: 09/22/15  2:16 PM  Result Value Ref Range   Ammonia 18 9 - 35 umol/L  Lactic acid, plasma     Status: None   Collection Time: 09/22/15  2:30 PM  Result Value Ref Range   Lactic Acid, Venous 1.1 0.5 - 2.0 mmol/L  Urinalysis complete, with  microscopic ([facility] only)     Status: Abnormal   Collection Time: 09/22/15  3:39 PM  Result Value Ref Range   Color, Urine STRAW (A) YELLOW   APPearance CLEAR (A) CLEAR   Glucose, UA NEGATIVE NEGATIVE mg/dL   Bilirubin Urine NEGATIVE NEGATIVE   Ketones, ur NEGATIVE NEGATIVE mg/dL   Specific Gravity, Urine 1.006 1.005 - 1.030   Hgb urine dipstick NEGATIVE NEGATIVE   pH 6.0 5.0 - 8.0   Protein, ur NEGATIVE NEGATIVE mg/dL   Nitrite NEGATIVE NEGATIVE   Leukocytes, UA NEGATIVE NEGATIVE   RBC / HPF 0-5 0 - 5 RBC/hpf   WBC, UA 0-5 0 - 5 WBC/hpf   Bacteria, UA NONE SEEN NONE SEEN   Squamous Epithelial / LPF NONE SEEN NONE SEEN   Hyaline Casts, UA PRESENT   Urine Drug Screen, Qualitative ([facility] only)     Status: Abnormal   Collection Time: 09/22/15  3:39 PM  Result Value Ref Range   Tricyclic, Ur Screen NONE DETECTED NONE DETECTED   Amphetamines, Ur Screen NONE DETECTED NONE DETECTED   MDMA (Ecstasy)Ur Screen NONE DETECTED NONE DETECTED   Cocaine Metabolite,Ur [place] NONE DETECTED NONE DETECTED   Opiate, Ur Screen NONE DETECTED NONE DETECTED   Phencyclidine (PCP) Ur S NONE DETECTED NONE DETECTED   Cannabinoid 50 Ng, Ur [place] NONE DETECTED NONE DETECTED   Barbiturates, Ur Screen NONE DETECTED NONE DETECTED   Benzodiazepine, Ur Scrn POSITIVE (A) NONE DETECTED   Methadone Scn, Ur NONE DETECTED NONE DETECTED    Comment: (NOTE) 465  Tricyclics, urine               Cutoff 1000 ng/mL 200  Amphetamines, urine             Cutoff 1000 ng/mL 300  MDMA (Ecstasy), urine           Cutoff 500 ng/mL 400  Cocaine Metabolite, urine       Cutoff 300 ng/mL 500  Opiate, urine                   Cutoff 300 ng/mL 600  Phencyclidine (PCP), urine      Cutoff 25 ng/mL 700  Cannabinoid, urine              Cutoff 50 ng/mL 800  Barbiturates, urine             Cutoff 200 ng/mL 900  Benzodiazepine, urine           Cutoff 200 ng/mL 1000 Methadone, urine                Cutoff 300 ng/mL 1100 1200 The urine drug screen  provides only a preliminary, unconfirmed 1300 analytical test result and should not be used for non-medical 1400 purposes. Clinical consideration and professional judgment should 1500 be applied to any positive drug screen result due to possible 1600 interfering substances. A more specific alternate chemical method 1700 must be used in order to obtain a confirmed analytical result.  1800 Gas chromato graphy / mass spectrometry (GC/MS) is the preferred 1900 confirmatory method.   Lactic acid, plasma     Status: None   Collection Time: 09/22/15  5:22 PM  Result Value Ref Range   Lactic Acid, Venous 1.0 0.5 - 2.0 mmol/L  Culture, blood (Routine X 2) w Reflex to ID Panel  Status: None (Preliminary result)   Collection Time: 09/22/15 10:13 PM  Result Value Ref Range   Specimen Description BLOOD RIGHT WRIST    Special Requests BOTTLES DRAWN AEROBIC AND ANAEROBIC 5ML    Culture NO GROWTH < 12 HOURS    Report Status PENDING   Culture, blood (Routine X 2) w Reflex to ID Panel     Status: None (Preliminary result)   Collection Time: 09/22/15 11:45 PM  Result Value Ref Range   Specimen Description BLOOD RIGHT WRIST    Special Requests BOTTLES DRAWN AEROBIC AND ANAEROBIC 5ML    Culture NO GROWTH < 12 HOURS    Report Status PENDING   MRSA PCR Screening     Status: None   Collection Time: 09/23/15 12:10 AM  Result Value Ref Range   MRSA by PCR NEGATIVE NEGATIVE    Comment:        The GeneXpert MRSA Assay (FDA approved for NASAL specimens only), is one component of a comprehensive MRSA colonization surveillance program. It is not intended to diagnose MRSA infection nor to guide or monitor treatment for MRSA infections.   Glucose, capillary     Status: Abnormal   Collection Time: 09/23/15  1:22 AM  Result Value Ref Range   Glucose-Capillary 166 (H) 65 - 99 mg/dL  Blood gas, arterial     Status: Abnormal   Collection Time: 09/23/15  1:54 AM  Result Value Ref Range   FIO2 0.21     Delivery systems ROOM AIR    pH, Arterial 7.44 7.350 - 7.450   pCO2 arterial 49 (H) 32.0 - 48.0 mmHg   pO2, Arterial 56 (L) 83.0 - 108.0 mmHg   Bicarbonate 33.3 (H) 21.0 - 28.0 mEq/L   Acid-Base Excess 7.9 (H) 0.0 - 3.0 mmol/L   O2 Saturation 89.9 %   Patient temperature 37.0    Collection site RIGHT RADIAL    Sample type ARTERIAL DRAW    Allens test (pass/fail) POSITIVE (A) PASS  Lactic acid, plasma     Status: None   Collection Time: 09/23/15  2:00 AM  Result Value Ref Range   Lactic Acid, Venous 1.6 0.5 - 2.0 mmol/L  Troponin I     Status: None   Collection Time: 09/23/15  2:00 AM  Result Value Ref Range   Troponin I <0.03 <0.031 ng/mL    Comment:        NO INDICATION OF MYOCARDIAL INJURY.   CBC     Status: Abnormal   Collection Time: 09/23/15  2:00 AM  Result Value Ref Range   WBC 1.9 (L) 3.6 - 11.0 K/uL   RBC 2.48 (L) 3.80 - 5.20 MIL/uL   Hemoglobin 8.1 (L) 12.0 - 16.0 g/dL   HCT 23.1 (L) 35.0 - 47.0 %   MCV 93.1 80.0 - 100.0 fL   MCH 32.6 26.0 - 34.0 pg   MCHC 35.0 32.0 - 36.0 g/dL   RDW 16.7 (H) 11.5 - 14.5 %   Platelets 122 (L) 150 - 440 K/uL  Comprehensive metabolic panel     Status: Abnormal   Collection Time: 09/23/15  2:00 AM  Result Value Ref Range   Sodium 139 135 - 145 mmol/L   Potassium 3.4 (L) 3.5 - 5.1 mmol/L   Chloride 104 101 - 111 mmol/L   CO2 32 22 - 32 mmol/L   Glucose, Bld 184 (H) 65 - 99 mg/dL   BUN 18 6 - 20 mg/dL   Creatinine, Ser 1.43 (H) 0.44 -  1.00 mg/dL   Calcium 8.7 (L) 8.9 - 10.3 mg/dL   Total Protein 5.5 (L) 6.5 - 8.1 g/dL   Albumin 2.2 (L) 3.5 - 5.0 g/dL   AST 19 15 - 41 U/L   ALT 17 14 - 54 U/L   Alkaline Phosphatase 84 38 - 126 U/L   Total Bilirubin 0.4 0.3 - 1.2 mg/dL   GFR calc non Af Amer 34 (L) >60 mL/min   GFR calc Af Amer 40 (L) >60 mL/min    Comment: (NOTE) The eGFR has been calculated using the CKD EPI equation. This calculation has not been validated in all clinical situations. eGFR's persistently <60 mL/min  signify possible Chronic Kidney Disease.    Anion gap 3 (L) 5 - 15  Valproic acid level     Status: Abnormal   Collection Time: 09/23/15  2:00 AM  Result Value Ref Range   Valproic Acid Lvl 36 (L) 50.0 - 100.0 ug/mL  TSH     Status: None   Collection Time: 09/23/15  2:00 AM  Result Value Ref Range   TSH 2.040 0.350 - 4.500 uIU/mL   Dg Chest 1 View  09/22/2015  CLINICAL DATA:  Altered mental status and lethargy EXAM: CHEST 1 VIEW COMPARISON:  09/07/2014 FINDINGS: Cardiac shadow is stable. Lungs are well aerated bilaterally. No focal infiltrate or sizable effusion is noted. No acute bone abnormality is noted IMPRESSION: No active disease. Electronically Signed   By: Inez Catalina M.D.   On: 09/22/2015 15:36   Ct Head Wo Contrast  09/23/2015  CLINICAL DATA:  Unresponsive. EXAM: CT HEAD WITHOUT CONTRAST TECHNIQUE: Contiguous axial images were obtained from the base of the skull through the vertex without intravenous contrast. COMPARISON:  Head CT 12 hours prior.  Brain MRI 07/27/2015 FINDINGS: No intracranial hemorrhage or findings of involving ischemia. No change from prior exam. Unchanged atrophy and chronic small vessel ischemic change. No subdural or extra-axial collection. Opacification of both maxillary sinuses with fluid levels. Opacification of scattered ethmoid air cells, and sphenoid sinus mucosal thickening. No calvarial fracture. IMPRESSION: 1. No acute intracranial abnormality. No hemorrhage or evidence of a evolving ischemia compared to prior exam. 2. Maxillary sinus opacification with fluid levels, may be acute sinusitis. Electronically Signed   By: Jeb Levering M.D.   On: 09/23/2015 02:28   Ct Head Wo Contrast  09/22/2015  CLINICAL DATA:  Lethargy EXAM: CT HEAD WITHOUT CONTRAST TECHNIQUE: Contiguous axial images were obtained from the base of the skull through the vertex without intravenous contrast. COMPARISON:  07/27/2015 FINDINGS: Bony calvarium is intact. No gross soft tissue  abnormality is seen. Mild mucosal thickening is noted within the maxillary and sphenoid sinuses. Mild atrophic changes are noted. No findings to suggest acute hemorrhage, acute infarction or space-occupying mass lesion are noted. IMPRESSION: Atrophic changes without acute abnormality. Electronically Signed   By: Inez Catalina M.D.   On: 09/22/2015 15:29   Mr Brain Wo Contrast  09/23/2015  CLINICAL DATA:  Altered mental status. EXAM: MRI HEAD WITHOUT CONTRAST TECHNIQUE: Multiplanar, multiecho pulse sequences of the brain and surrounding structures were obtained without intravenous contrast. COMPARISON:  CT head without contrast 09/23/2015. FINDINGS: There is layering debris within the occipital horns of the lateral ventricles bilaterally, worse on the left. This is new. There slight increase in ventricular size since the prior study. No acute infarct, hemorrhage, or other mass lesion is present. Moderate periventricular and subcortical white matter changes bilaterally are stable. No significant extra-axial fluid collection is  present. The internal auditory canals are within normal limits bilaterally. T1 and T2 hyperintense material is present within the right Peters apex. There is minimal fluid in the right mastoid air cells. Internal auditory canals are normal. Flow is present in the major intracranial arteries. Bilateral lens replacements are present. There is diffuse opacification of maxillary sinuses bilaterally. Restricted diffusion suggests infection. Diffuse mucosal thickening is present through the remaining paranasal sinuses. Mucosal disease is more prominent right than left in the sphenoid sinuses. IMPRESSION: 1. New layering material within the lateral ventricles. This may related to infection. Neoplasm is considered less likely. This does not appear to be hemorrhage. Recommend CSF sampling for further evaluation. 2. Stable atrophy and white matter disease otherwise reflects the sequela of chronic  microvascular ischemia. 3. No acute infarct. 4. A diffuse sinus disease with restricted diffusion the maxillary sinuses bilaterally compatible with acute sinusitis. These results will be called to the ordering clinician or representative by the Radiologist Assistant, and communication documented in the PACS or zVision Dashboard. Electronically Signed   By: San Morelle M.D.   On: 09/23/2015 14:04    ROS: Review of Systems  Unable to perform ROS: mental acuity    Vital Signs: Blood pressure 162/40, pulse 62, temperature 97.5 F (36.4 C), temperature source Oral, resp. rate 18, height '5\' 7"'$  (1.702 m), weight 180 lb 5 oz (81.789 kg), SpO2 100 %.  Physical Exam: Physical Exam  Constitutional: She appears well-developed and well-nourished.  HENT:  Head: Normocephalic and atraumatic.  Eyes: Conjunctivae and EOM are normal. Pupils are equal, round, and reactive to light.  Neck: Normal range of motion. Neck supple.  Cardiovascular: Normal rate, regular rhythm and normal heart sounds.   Respiratory: Effort normal and breath sounds normal.  GI: Soft. Bowel sounds are normal.  Musculoskeletal: Normal range of motion.  Neurological: She is alert.  Answering questions appropriately but not a good historian.  Skin: Skin is warm and dry.     Assessment/Plan 1. Mild pancytopenia. Possibly related to current infectious process. Will obtain further labs including, platelet antibody, folate, iron/TIBC, and ferritin. Discussed with Dr. Grayland Ormond, patient may need more aggressive investigation regarding source of pancytopenia if there is no improvement over the next few days. Discussed this with daughter as well, some hesitance regarding aggressive testing at this time. Agreed to continue to follow at this time.   Dr. Grayland Ormond was available for consultation and review of plan of care for this patient.  Evlyn Kanner, NP 09/23/2015, 3:26 PM

## 2015-09-23 NOTE — Progress Notes (Signed)
*  PRELIMINARY RESULTS* Echocardiogram 2D Echocardiogram has been performed.  [name] 09/23/2015, 1:43 PM

## 2015-09-23 NOTE — Progress Notes (Signed)
[facility] [place] at [facility]   PATIENT NAME: [name]    MR#:  [id number]  DATE OF BIRTH:  [birth year]  SUBJECTIVE:  CHIEF COMPLAINT:   Chief Complaint  Patient presents with  . Altered Mental Status  Patient is 80 year old African-American female with past medical history significant for history of DVTs, hypertension, anemia, CHF, who presents to the hospital with altered mental status, sleepiness. On arrival to the hospital patient had CT scan of the head which was unremarkable for new findings, but atrophy. Patient's labs were concerning for Mild CO2 retention, hypokalemia, renal insufficiency, hyperglycemia, pancytopenia. Patient was noted to be hypothermic and was initiated on the bear hugger. She seems to be more alert today and able to answer a few questions, follows some commands. She was able to eat 50% of offered meal   . Review of Systems  Unable to perform ROS: medical condition    VITAL SIGNS: Blood pressure 162/40, pulse 62, temperature 97.5 F (36.4 C), temperature source Oral, resp. rate 18, height  (1.702 m), weight 81.789 kg (180 lb 5 oz), SpO2 100 %.  PHYSICAL EXAMINATION:   GENERAL:  80 y.o.-year-old patient lying in the bed with no acute distress. Sitting in the bed, seems to be comfortable but somewhat sleepy EYES: Pupils equal, round, reactive to light and accommodation. No scleral icterus. Extraocular muscles intact.  HEENT: Head atraumatic, normocephalic. Oropharynx and nasopharynx clear. Significant nuchal rigidity  NECK:  Rigid, unable to flex, no jugular venous distention. No thyroid enlargement, no tenderness.  LUNGS: Normal breath sounds bilaterally, no wheezing, rales,rhonchi or crepitation. No use of accessory muscles of respiration.  CARDIOVASCULAR: S1, S2 normal. No murmurs, rubs, or gallops.  ABDOMEN: Soft, nontender, nondistended. Bowel sounds present. No organomegaly or mass.  EXTREMITIES: No pedal edema,  cyanosis, or clubbing.  NEUROLOGIC: Cranial nerves II through XII are grossly intact. Muscle strength difficult to evaluate due to patient's condition. Sensation intact. Gait not checked.  PSYCHIATRIC: The patient is alert , able to answer some questions, follows simple commands.  SKIN: No obvious rash, lesion, or ulcer.   ORDERS/RESULTS REVIEWED:   CBC  Recent Labs Lab 09/22/15 1335 09/23/15 0200  WBC 2.9* 1.9*  HGB 9.2* 8.1*  HCT 26.1* 23.1*  PLT 140* 122*  MCV 92.9 93.1  MCH 32.7 32.6  MCHC 35.2 35.0  RDW 16.8* 16.7*   ------------------------------------------------------------------------------------------------------------------  Chemistries   Recent Labs Lab 09/22/15 1335 09/23/15 0200  NA 139 139  K 3.6 3.4*  CL 102 104  CO2 32 32  GLUCOSE 128* 184*  BUN 20 18  CREATININE 1.46* 1.43*  CALCIUM 9.2 8.7*  AST 17 19  ALT 19 17  ALKPHOS 105 84  BILITOT 0.5 0.4   ------------------------------------------------------------------------------------------------------------------ estimated creatinine clearance is 36.3 mL/min (by C-G formula based on Cr of 1.43). ------------------------------------------------------------------------------------------------------------------  Recent Labs  09/23/15 0200  TSH 2.040    Cardiac Enzymes  Recent Labs Lab 09/22/15 1335 09/23/15 0200  TROPONINI <0.03 <0.03   ------------------------------------------------------------------------------------------------------------------ Invalid input(s): POCBNP ---------------------------------------------------------------------------------------------------------------  RADIOLOGY: Dg Chest 1 View  09/22/2015  CLINICAL DATA:  Altered mental status and lethargy EXAM: CHEST 1 VIEW COMPARISON:  09/07/2014 FINDINGS: Cardiac shadow is stable. Lungs are well aerated bilaterally. No focal infiltrate or sizable effusion is noted. No acute bone abnormality is noted IMPRESSION: No  active disease. Electronically Signed   By: Alcide Clever M.D.   On: 09/22/2015 15:36   Ct Head Wo Contrast  09/23/2015  CLINICAL DATA:  Unresponsive. EXAM: CT HEAD WITHOUT CONTRAST TECHNIQUE: Contiguous axial images were obtained from the base of the skull through the vertex without intravenous contrast. COMPARISON:  Head CT 12 hours prior.  Brain MRI 07/27/2015 FINDINGS: No intracranial hemorrhage or findings of involving ischemia. No change from prior exam. Unchanged atrophy and chronic small vessel ischemic change. No subdural or extra-axial collection. Opacification of both maxillary sinuses with fluid levels. Opacification of scattered ethmoid air cells, and sphenoid sinus mucosal thickening. No calvarial fracture. IMPRESSION: 1. No acute intracranial abnormality. No hemorrhage or evidence of a evolving ischemia compared to prior exam. 2. Maxillary sinus opacification with fluid levels, may be acute sinusitis. Electronically Signed   By: Rubye Oaks M.D.   On: 09/23/2015 02:28   Ct Head Wo Contrast  09/22/2015  CLINICAL DATA:  Lethargy EXAM: CT HEAD WITHOUT CONTRAST TECHNIQUE: Contiguous axial images were obtained from the base of the skull through the vertex without intravenous contrast. COMPARISON:  07/27/2015 FINDINGS: Bony calvarium is intact. No gross soft tissue abnormality is seen. Mild mucosal thickening is noted within the maxillary and sphenoid sinuses. Mild atrophic changes are noted. No findings to suggest acute hemorrhage, acute infarction or space-occupying mass lesion are noted. IMPRESSION: Atrophic changes without acute abnormality. Electronically Signed   By: Alcide Clever M.D.   On: 09/22/2015 15:29   Mr Brain Wo Contrast  09/23/2015  CLINICAL DATA:  Altered mental status. EXAM: MRI HEAD WITHOUT CONTRAST TECHNIQUE: Multiplanar, multiecho pulse sequences of the brain and surrounding structures were obtained without intravenous contrast. COMPARISON:  CT head without contrast  09/23/2015. FINDINGS: There is layering debris within the occipital horns of the lateral ventricles bilaterally, worse on the left. This is new. There slight increase in ventricular size since the prior study. No acute infarct, hemorrhage, or other mass lesion is present. Moderate periventricular and subcortical white matter changes bilaterally are stable. No significant extra-axial fluid collection is present. The internal auditory canals are within normal limits bilaterally. T1 and T2 hyperintense material is present within the right Peters apex. There is minimal fluid in the right mastoid air cells. Internal auditory canals are normal. Flow is present in the major intracranial arteries. Bilateral lens replacements are present. There is diffuse opacification of maxillary sinuses bilaterally. Restricted diffusion suggests infection. Diffuse mucosal thickening is present through the remaining paranasal sinuses. Mucosal disease is more prominent right than left in the sphenoid sinuses. IMPRESSION: 1. New layering material within the lateral ventricles. This may related to infection. Neoplasm is considered less likely. This does not appear to be hemorrhage. Recommend CSF sampling for further evaluation. 2. Stable atrophy and white matter disease otherwise reflects the sequela of chronic microvascular ischemia. 3. No acute infarct. 4. A diffuse sinus disease with restricted diffusion the maxillary sinuses bilaterally compatible with acute sinusitis. These results will be called to the ordering clinician or representative by the Radiologist Assistant, and communication documented in the PACS or zVision Dashboard. Electronically Signed   By: Marin Roberts M.D.   On: 09/23/2015 14:04    EKG:  Orders placed or performed during the hospital encounter of 09/22/15  . ED EKG  . ED EKG  . EKG 12-Lead  . EKG 12-Lead  . ED EKG  . ED EKG  . EKG 12-Lead  . EKG 12-Lead    ASSESSMENT AND PLAN:  Active  Problems:   Altered mental status #1 altered mental status, likely metabolic encephalopathy due to acute bacterial meningitis, concerning for bacterial, neurology consultation is obtained for  LP, initiate patient on the Rocephin intravenously, continue vancomycin, initiate dexamethasone, following culture results #2. Pancytopenia, get oncologist involved for further recommendations as patient has been pancytopenic for a while, get differential #3. Hypokalemia, supplement intravenously, check magnesium level #4. Renal insufficiency, chronic, stage III, seems to be stable from before, follow with therapy   Management plans discussed with the patient, family and they are in agreement.   DRUG ALLERGIES:  Allergies  Allergen Reactions  . Hydrocodone-Acetaminophen Nausea And Vomiting and Other (See Comments)    Reaction:  Dizziness   . Morphine And Related Other (See Comments)    Reaction:  Hallucinations     CODE STATUS:     Code Status Orders        Start     Ordered   09/22/15 2127  Full code   Continuous     09/22/15 2126    Code Status History    Date Active Date Inactive Code Status Order ID Comments User Context   07/24/2015  2:18 AM 07/28/2015  6:12 PM Full Code [id number]  Ihor AustinPavan Pyreddy, MD Inpatient   09/03/2014  1:01 PM 09/05/2014  6:09 PM Full Code [id number]  Enedina FinnerSona Patel, MD Inpatient    Advance Directive Documentation        Most Recent Value   Type of Advance Directive  Healthcare Power of Attorney   Pre-existing out of facility DNR order (yellow form or pink MOST form)     "MOST" Form in Place?        TOTAL TIME TAKING CARE OF THIS PATIENT: 40 minutes.    Katharina CaperVAICKUTE,Mersades Barbaro M.D on 09/23/2015 at 2:21 PM  Between 7am to 6pm - Pager - [phone]  After 6pm go to [email or web address] - password [email or web address] [facility]  [facility]  Office  [phone]  CC: Primary care physician; Sherlene ShamsULLO, TERESA L, MD

## 2015-09-23 NOTE — Progress Notes (Signed)
Pt arrived on unit alert and oriented, low temperature, warming blanket ordered per MD. Blood cultures  and abx ordered per MD.. At 0100, patient became hypotensive and unresponsive,  MD notified, MD arrived on unit, labs, CT scan, EKG and  250 bolus given per MD. Blood pressure improved. All results informed to MD. Warming blanket continues.

## 2015-09-23 NOTE — Clinical Social Work Note (Addendum)
Clinical Social Work Assessment  Patient Details  Name: [name] MRN: [id number] Date of Birth: [birth year]  Date of referral:  09/23/15               Reason for consult:  Facility Placement, Other (Comment Required) (Long Term Care Resident at [facility] )                Permission sought to share information with:  Chartered certified accountant granted to share information::  Yes, Verbal Permission Granted  Name::      Media planner::   [place]   Relationship::     Contact Information:     Housing/Transportation Living arrangements for the past 2 months:  [place] of Information:  Patient, Adult Children Patient Interpreter Needed:  None Criminal Activity/Legal Involvement Pertinent to Current Situation/Hospitalization:  No - Comment as needed Significant Relationships:  Adult Children Lives with:  Facility Resident Do you feel safe going back to the place where you live?  Yes Need for family participation in patient care:  Yes (Comment)  Care giving concerns:  Patient is a long term care resident at [facility].    Social Worker assessment / plan:  Holiday representative (CSW) received consult for D/C planning. CSW reviewed chart and noted that patient is from [facility]. Per [facility] admissions coordinator at [facility] patient is a long term care resident and can return. CSW met with patient and she was pleasantly confused. Patient reported that she is from [facility] and is agreeable to return. Patient gave CSW permission to call her daughter [name]. CSW contacted patient's daughter and spoke briefly as daughter was at work. Per daughter patient is from [facility]. Daughter reported that she would call CSW back when she can talk a little more. CSW will continue to follow and assist as needed.   CSW received call back from patient's daughter [name] yesterday on 09/23/15. Daughter reported  that patient was at [facility] for STR and went to [facility] for [place]. Daughter reported that she found patient outside on the patio at [facility] and she was unresponsive and [facility]. Daughter is agreeable for patient to return to [facility].     Employment status:  Retired Nurse, adult, Medicaid In [place] PT Recommendations:  Not assessed at this time [place] / Referral to community resources:  [place]  Patient/Family's Response to care:  Patient is agreeable to return to [facility].   Patient/Family's Understanding of and Emotional Response to Diagnosis, Current Treatment, and Prognosis:  Patient and daughter were pleasant and thanked CSW for visit.   Emotional Assessment Appearance:  Appears stated age Attitude/Demeanor/Rapport:    Affect (typically observed):  Accepting, Adaptable, Pleasant Orientation:  Oriented to Self, Oriented to Place, Fluctuating Orientation (Suspected and/or reported Sundowners) Alcohol / Substance use:  Not Applicable Psych involvement (Current and /or in the community):  No (Comment)  Discharge Needs  Concerns to be addressed:  Discharge Planning Concerns Readmission within the last 30 days:  No Current discharge risk:  Dependent with Mobility Barriers to Discharge:  Continued Medical Work up   Elwyn Reach 09/23/2015, 3:33 PM

## 2015-09-23 NOTE — [place] FL2 (Signed)
[place] MEDICAID FL2 LEVEL OF CARE SCREENING TOOL     IDENTIFICATION  Patient Name: [name] LoboMertis Calma Birthdate: [birth year] Sex: female Admission Date (Current Location): 09/22/2015  [place]  [place]  ([id number]) Facility and Address:  [facility], [address], [place], [place] [address]      Provider Number: [id number]  Attending Physician Name and Address:  Katharina Caperima Vaickute, MD  Relative Name and Phone Number:       Current Level of Care: Hospital Recommended Level of Care: Skilled Nursing Facility Prior Approval Number:    Date Approved/Denied:   PASRR Number:  ( [id number] )  Discharge Plan: SNF    Current Diagnoses: Patient Active Problem List   Diagnosis Date Noted  . Rhabdomyolysis 07/24/2015  . Altered mental status 07/18/2015  . Hospital discharge follow-up 06/21/2015  . Mixed Alzheimer's and vascular dementia 06/02/2015  . Schizoaffective disorder, bipolar type (HCC) 05/17/2015  . Psychosis 05/16/2015  . CKD (chronic kidney disease) stage 3, GFR 30-59 ml/min 05/03/2015  . Noncompliance with medication treatment due to overuse of medication 01/28/2015  . Hypothyroidism 01/28/2015  . Dysuria 08/07/2014  . Back pain of thoracolumbar region 09/23/2013  . Visit for preventive health examination 08/18/2013  . Obesity 07/07/2013  . Depression with anxiety 06/26/2013  . Hemorrhoids 11/06/2012  . Hidradenitis suppurativa 10/05/2012  . Venous insufficiency of leg 11/24/2011  . Type 2 diabetes, uncontrolled, with renal manifestation (HCC) 10/03/2011  . Anemia of chronic renal failure, stage 4 (severe) (HCC) 10/03/2011  . Chest pain with normal coronary angiography 04/21/2011  . Screening for colon cancer 04/12/2011  . Screening for breast cancer 04/12/2011  . Hypertension 04/12/2011  . Hyperlipidemia with target LDL less than 100 04/12/2011  . Screening for osteoporosis 04/12/2011    Orientation RESPIRATION  BLADDER Height & Weight     Self  O2 (2 Liters Oxygen ) Continent Weight: 180 lb 5 oz (81.789 kg) Height:  5\' 7"  (170.2 cm)  BEHAVIORAL SYMPTOMS/MOOD NEUROLOGICAL BOWEL NUTRITION STATUS   (none )  (none ) Continent Diet (Diet: DYS 2 )  AMBULATORY STATUS COMMUNICATION OF NEEDS Skin   Extensive Assist Verbally Normal                       Personal Care Assistance Level of Assistance  Bathing, Feeding, Dressing Bathing Assistance: Limited assistance Feeding assistance: Independent Dressing Assistance: Limited assistance     Functional Limitations Info  Sight, Hearing, Speech Sight Info: Adequate Hearing Info: Adequate Speech Info: Adequate    SPECIAL CARE FACTORS FREQUENCY  PT (By licensed PT), OT (By licensed OT)     PT Frequency:  (5) OT Frequency:  (5)            Contractures      Additional Factors Info  Code Status, Allergies, Psychotropic Code Status Info:  (Full Code. ) Allergies Info:  (Hydrocodone-acetaminophen, Morphine And Related)           Current Medications (09/23/2015):  This is the current hospital active medication list Current Facility-Administered Medications  Medication Dose Route Frequency Provider Last Rate Last Dose  . aspirin EC tablet 81 mg  81 mg Oral Daily Enedina FinnerSona Patel, MD      . carvedilol (COREG) tablet 25 mg  25 mg Oral BID WC Katharina Caperima Vaickute, MD      . cloNIDine (CATAPRES) tablet 0.2 mg  0.2 mg Oral BID Katharina Caperima Vaickute, MD      . divalproex (DEPAKOTE) DR  tablet 250 mg  250 mg Oral BID Enedina Finner, MD   250 mg at 09/22/15 2308  . enoxaparin (LOVENOX) injection 40 mg  40 mg Subcutaneous Q24H Enedina Finner, MD   40 mg at 09/22/15 2323  . levothyroxine (SYNTHROID, LEVOTHROID) tablet 50 mcg  50 mcg Oral Q0600 Enedina Finner, MD   50 mcg at 09/23/15 0550  . linaclotide (LINZESS) capsule 145 mcg  145 mcg Oral Daily PRN Enedina Finner, MD      . memantine [facility]) tablet 5 mg  5 mg Oral Daily Enedina Finner, MD      . ondansetron (ZOFRAN) tablet 4 mg  4 mg  Oral Q6H PRN Enedina Finner, MD       Or  . ondansetron (ZOFRAN) injection 4 mg  4 mg Intravenous Q6H PRN Enedina Finner, MD      . pantoprazole (PROTONIX) EC tablet 40 mg  40 mg Oral Daily Enedina Finner, MD      . piperacillin-tazobactam (ZOSYN) IVPB 3.375 g  3.375 g Intravenous Q8H Debby Crosley, MD   3.375 g at 09/23/15 0151  . risperiDONE (RISPERDAL) tablet 3 mg  3 mg Oral QHS Enedina Finner, MD   3 mg at 09/22/15 2306  . vancomycin (VANCOCIN) IVPB 1000 mg/200 mL premix  1,000 mg Intravenous Q24H Debby Crosley, MD         Discharge Medications: Please see discharge summary for a list of discharge medications.  Relevant Imaging Results:  Relevant Lab Results:   Additional Information  (SSN: [id number])  Haig Prophet, LCSW

## 2015-09-23 NOTE — Clinical Documentation Improvement (Signed)
Hospitalist  Can the diagnosis of CHF be further specified?    Acuity - Acute, Chronic, Acute on Chronic   Type - Systolic, Diastolic, Systolic and Diastolic  Other  Clinically Undetermined   Document any associated diagnoses/conditions   Supporting Information: Patient with a history of CHF per 5/29 progress notes. 5/29: BNP: 152.0.   Please exercise your independent, professional judgment when responding. A specific answer is not anticipated or expected.   Thank Sabino DonovanYou,  Rubie Ficco [facility] [place] [phone]

## 2015-09-23 NOTE — Progress Notes (Signed)
Pharmacy Antibiotic Note  [name] is a 80 y.o. female admitted on 09/22/2015 with low temperature.  Pharmacy has been consulted for vancomycin and zosyn dosing.  Plan: DW 70kg  Vd 49L kei 0.034 hr-1  T1/2 20 hours.  Vancomycin 1 gram q 24 hours ordered with stacked dosing.  Level before 5th dose. Goal trough 15-20.  Zosyn 3.375 grams q 8 hours ordered.  Height: 5\' 7"  (170.2 cm) Weight: 180 lb 5 oz (81.789 kg) IBW/kg (Calculated) : 61.6  Temp (24hrs), Avg:92.6 F (33.7 C), Min:91.9 F (33.3 C), Max:93.3 F (34.1 C)   Recent Labs Lab 09/22/15 1335 09/22/15 1430 09/22/15 1722  WBC 2.9*  --   --   CREATININE 1.46*  --   --   LATICACIDVEN  --  1.1 1.0    Estimated Creatinine Clearance: 35.5 mL/min (by C-G formula based on Cr of 1.46).    Allergies  Allergen Reactions  . Hydrocodone-Acetaminophen Nausea And Vomiting and Other (See Comments)    Reaction:  Dizziness   . Morphine And Related Other (See Comments)    Reaction:  Hallucinations     Antimicrobials this admission: vancomycin  >>  Zosyn  >>   Dose adjustments this admission:   Microbiology results: 5/30 BCx: pending 5/30 MRSA PCR: pending  5/30 CXR: no active disease  Thank you for allowing pharmacy to be a part of this patient's care.  Tylan Kinn S 09/23/2015 12:01 AM

## 2015-09-23 NOTE — Clinical Documentation Improvement (Signed)
Hospitalist  Can the diagnosis of altered mental status be further specified?   Confusion/delirium (including drug induced)  Stupor/Semi-coma  Encephalopathy - Alcoholic, Anoxic/Hypoxia, Drug Induced/Toxic (specify drug), Hepatic, Hypertensive, Hypoglycemic, Metabolic/Septic, Traumatic/post concussive, Wernicke, Other  Other  Clinically Undetermined  Document any associated diagnoses/conditions.   Supporting Information: Patient with AMS per 5/29 progress notes.   Please exercise your independent, professional judgment when responding. A specific answer is not anticipated or expected.   Thank Sabino DonovanYou,  Nastassia Bazaldua [facility] [place] [phone]

## 2015-09-23 NOTE — Progress Notes (Signed)
Speech Therapy Note: Received order, reviewed chart notes and contacted NSG. Pt is currently out of room for MRI per NSG. ST will f/u w/ assessment either today or tomorrow morning.

## 2015-09-23 NOTE — Evaluation (Signed)
Physical Therapy Evaluation Patient Details Name: [name] MRN: [id number] DOB: 1935-06-10 Today's Date: 09/23/2015   History of Present Illness  Pt admitted for complaints of AMS. Pt with history of DM, HTN, anemia, CHF, and GERD. Pt slightly confused upon evaluation. Warming blanket donned upon arrival to room. RN disconnected in order to allow pt mobility.  Clinical Impression  Pt is a pleasant 80 year old female who was admitted for AMS. Pt performs bed mobility, transfers, and ambulation with min assist and rw. Pt still confused on situation, however oriented to place. Pt demonstrates deficits with strength/mobility. Pt motivated to participate in therapy. Would benefit from skilled PT to address above deficits and promote optimal return to PLOF; recommend transition to STR upon discharge from acute hospitalization.       Follow Up Recommendations SNF    Equipment Recommendations       Recommendations for Other Services       Precautions / Restrictions Precautions Precautions: Fall Restrictions Weight Bearing Restrictions: No      Mobility  Bed Mobility Overal bed mobility: Needs Assistance Bed Mobility: Supine to Sit     Supine to sit: Min assist     General bed mobility comments: assist for initiating movement and cues for sequencing. Pt able to use bed rails to upright trunk. Once seated at EOB, pt able to sit with upright posture  Transfers Overall transfer level: Needs assistance Equipment used: Rolling walker (2 wheeled) Transfers: Sit to/from Stand Sit to Stand: Min assist         General transfer comment: transfers performed with cues for sequencing. Once standing, pt cued for upright posture. Slight post leaning noted  Ambulation/Gait Ambulation/Gait assistance: Min assist Ambulation Distance (Feet): 5 Feet Assistive device: Rolling walker (2 wheeled) Gait Pattern/deviations: Step-through pattern     General Gait Details: ambulated using  rw and slow step to gait pattern. Pt able to follow commands, however needs manual cues for manueving rw.  Stairs            Wheelchair Mobility    Modified Rankin (Stroke Patients Only)       Balance Overall balance assessment: Needs assistance Sitting-balance support: Feet supported Sitting balance-Leahy Scale: Good     Standing balance support: Bilateral upper extremity supported Standing balance-Leahy Scale: Fair                               Pertinent Vitals/Pain Pain Assessment: No/denies pain    Home Living Family/patient expects to be discharged to:: Skilled nursing facility                      Prior Function Level of Independence: Needs assistance         Comments: Pt from SNF [facility] receiving rehab at this time     Hand Dominance        Extremity/Trunk Assessment   Upper Extremity Assessment: Generalized weakness (B UE grossly 3+/5)           Lower Extremity Assessment: Generalized weakness (B LE grossly 3+/5)         Communication   Communication: No difficulties  Cognition Arousal/Alertness: Awake/alert Behavior During Therapy: WFL for tasks assessed/performed Overall Cognitive Status: History of cognitive impairments - at baseline                      General Comments  Exercises Other Exercises Other Exercises: Pt performed supine ther-ex x 10 reps including ankle pumps, SLRs, and hip abd/add. All ther-ex performed with min assist. All ther-ex performed on B LE      Assessment/Plan    PT Assessment Patient needs continued PT services  PT Diagnosis Abnormality of gait;Generalized weakness;Difficulty walking   PT Problem List Decreased strength;Decreased mobility  PT Treatment Interventions Gait training;DME instruction   PT Goals (Current goals can be found in the Care Plan section) Acute Rehab PT Goals Patient Stated Goal: to get stronger PT Goal Formulation: With  patient Time For Goal Achievement: 10/07/15 Potential to Achieve Goals: Good    Frequency Min 2X/week   Barriers to discharge        Co-evaluation               End of Session Equipment Utilized During Treatment: Gait belt Activity Tolerance: Patient tolerated treatment well Patient left: in chair;with chair alarm set Nurse Communication: Mobility status         Time: [id number] PT Time Calculation (min) (ACUTE ONLY): 21 min   Charges:   PT Evaluation $PT Eval Moderate Complexity: 1 Procedure PT Treatments $Therapeutic Exercise: 8-22 mins   PT G Codes:        Laurann Mcmorris 09/23/2015, 3:14 PM  Elizabeth PalauStephanie Dejay Kronk, PT, DPT [phone]

## 2015-09-24 ENCOUNTER — Inpatient Hospital Stay: Payer: Medicare Other

## 2015-09-24 DIAGNOSIS — L8990 Pressure ulcer of unspecified site, unspecified stage: Secondary | ICD-10-CM

## 2015-09-24 LAB — CBC
HCT: 29.7 % — ABNORMAL LOW (ref 35.0–47.0)
Hemoglobin: 10.3 g/dL — ABNORMAL LOW (ref 12.0–16.0)
MCH: 32.8 pg (ref 26.0–34.0)
MCHC: 34.6 g/dL (ref 32.0–36.0)
MCV: 94.8 fL (ref 80.0–100.0)
Platelets: 152 10*3/uL (ref 150–440)
RBC: 3.14 MIL/uL — ABNORMAL LOW (ref 3.80–5.20)
RDW: 16.6 % — ABNORMAL HIGH (ref 11.5–14.5)
WBC: 3.5 10*3/uL — ABNORMAL LOW (ref 3.6–11.0)

## 2015-09-24 LAB — PROTEIN AND GLUCOSE, CSF
Glucose, CSF: 141 mg/dL — ABNORMAL HIGH (ref 40–70)
Total  Protein, CSF: 25 mg/dL (ref 15–45)

## 2015-09-24 LAB — CREATININE, SERUM
CREATININE: 1.63 mg/dL — AB (ref 0.44–1.00)
GFR calc Af Amer: 34 mL/min — ABNORMAL LOW (ref >60)
GFR calc non Af Amer: 29 mL/min — ABNORMAL LOW (ref >60)

## 2015-09-24 LAB — CSF CELL COUNT WITH DIFFERENTIAL
Eosinophils, CSF: 0 %
Lymphs, CSF: 0 %
Monocyte-Macrophage-Spinal Fluid: 0 %
OTHER CELLS CSF: 0
RBC COUNT CSF: 5 /mm3 — AB (ref 0–3)
Segmented Neutrophils-CSF: 0 %
Tube #: 3
WBC CSF: 0 /mm3

## 2015-09-24 LAB — RAPID HIV SCREEN (HIV 1/2 AB+AG)
HIV 1/2 Antibodies: NONREACTIVE
HIV-1 P24 ANTIGEN - HIV24: NONREACTIVE

## 2015-09-24 LAB — CORTISOL: CORTISOL PLASMA: 1.8 ug/dL

## 2015-09-24 LAB — POTASSIUM: Potassium: 4 mmol/L (ref 3.5–5.1)

## 2015-09-24 MED ORDER — HYDRALAZINE HCL 20 MG/ML IJ SOLN
5.0000 mg | Freq: Four times a day (QID) | INTRAMUSCULAR | Status: DC | PRN
  Administered 2015-09-24 – 2015-09-26 (×4): 5 mg via INTRAVENOUS
  Filled 2015-09-24 (×4): qty 1

## 2015-09-24 MED ORDER — POLYETHYLENE GLYCOL 3350 17 G PO PACK
17.0000 g | PACK | Freq: Once | ORAL | Status: AC
  Administered 2015-09-24: 17 g via ORAL
  Filled 2015-09-24: qty 1

## 2015-09-24 MED ORDER — DOXYCYCLINE HYCLATE 100 MG IV SOLR
100.0000 mg | Freq: Two times a day (BID) | INTRAVENOUS | Status: DC
  Administered 2015-09-24 – 2015-09-25 (×2): 100 mg via INTRAVENOUS
  Filled 2015-09-24 (×4): qty 100

## 2015-09-24 MED ORDER — SODIUM CHLORIDE 0.9 % IV SOLN
3.0000 g | Freq: Four times a day (QID) | INTRAVENOUS | Status: DC
  Administered 2015-09-25 (×3): 3 g via INTRAVENOUS
  Filled 2015-09-24 (×5): qty 3

## 2015-09-24 MED ORDER — VANCOMYCIN HCL IN DEXTROSE 750-5 MG/150ML-% IV SOLN
750.0000 mg | INTRAVENOUS | Status: DC
  Administered 2015-09-24: 13:00:00 750 mg via INTRAVENOUS
  Filled 2015-09-24 (×2): qty 150

## 2015-09-24 MED ORDER — HYDRALAZINE HCL 20 MG/ML IJ SOLN
INTRAMUSCULAR | Status: AC
  Filled 2015-09-24: qty 1

## 2015-09-24 MED ORDER — PANTOPRAZOLE SODIUM 40 MG PO TBEC
40.0000 mg | DELAYED_RELEASE_TABLET | Freq: Once | ORAL | Status: AC
  Administered 2015-09-24: 21:00:00 40 mg via ORAL
  Filled 2015-09-24: qty 1

## 2015-09-24 NOTE — Progress Notes (Signed)
PT Cancellation Note  Patient Details Name: [name] MRN: [id number] DOB: 1936/04/14   Cancelled Treatment:    Reason Eval/Treat Not Completed: Other (comment). Pt currently out of room for EEG. Not available for therapy. Will re-attempt time permitting.   Keyshaun Exley 09/24/2015, 9:35 AM  Elizabeth PalauStephanie Regginald Pask, PT, DPT [phone]

## 2015-09-24 NOTE — Care Management Important Message (Signed)
Important Message  Patient Details  Name: [name] LoboMertis Hirata MRN: [id number] Date of Birth: 07/06/1935   Medicare Important Message Given:  Yes    Gwenette GreetBrenda S Neo Yepiz, RN 09/24/2015, 11:38 AM

## 2015-09-24 NOTE — Progress Notes (Addendum)
Pharmacy Antibiotic Note  [name] is a 80 y.o. female admitted on 09/22/2015 with low temperature. Now possible meningitis. Pharmacy has been consulted for vancomycin.  Plan: DW 70kg  Vd 49L kei 0.034 hr-1  T1/2 20 hours. Vancomycin 1 gram q 24 hours ordered with stacked dosing.  Level before 5th dose. Goal trough 15-20.  5/31: Scr increased from 1.46 to 1.63. Will adjust Vancomycin to 750mg  IV q24h. F/u Scr in am. Trough 6/2 at 1100. Ke 0.028  T1/2 24.76 Vd 48.7 (Ceftriaxone was added, Zosyn d/c. MD did not want to add Ampicillin at this time)    Height: 5\' 7"  (170.2 cm) Weight: 180 lb 5 oz (81.789 kg) IBW/kg (Calculated) : 61.6  Temp (24hrs), Avg:97.8 F (36.6 C), Min:97.5 F (36.4 C), Max:98.4 F (36.9 C)   Recent Labs Lab 09/22/15 1335 09/22/15 1430 09/22/15 1722 09/23/15 0200 09/24/15 0510  WBC 2.9*  --   --  1.9* 3.5*  CREATININE 1.46*  --   --  1.43* 1.63*  LATICACIDVEN  --  1.1 1.0 1.6  --     Estimated Creatinine Clearance: 31.8 mL/min (by C-G formula based on Cr of 1.63).    Allergies  Allergen Reactions  . Hydrocodone-Acetaminophen Nausea And Vomiting and Other (See Comments)    Reaction:  Dizziness   . Morphine And Related Other (See Comments)    Reaction:  Hallucinations     Antimicrobials this admission: vancomycin 5/30 >>  Zosyn  5/30>> 5/31 CTX 5/31 >>  Dose adjustments this admission:   Microbiology results: 5/30 BCx: pending 5/30 MRSA PCR: negative  5/30 CXR: no active disease  Thank you for allowing pharmacy to be a part of this patient's care.  Finian Helvey A 09/24/2015 8:36 AM

## 2015-09-24 NOTE — Progress Notes (Signed)
Subjective: Patient awake and alert.  Remembers our conversation from yesterday.    Objective: Current vital signs: BP 168/50 mmHg  Pulse 85  Temp(Src) 97.6 F (36.4 C) (Oral)  Resp 18  Ht  (1.702 m)  Wt 81.789 kg (180 lb 5 oz)  BMI 28.23 kg/m2  SpO2 98% Vital signs in last 24 hours: Temp:  [97.5 F (36.4 C)-98.4 F (36.9 C)] 97.6 F (36.4 C) (05/31 1610) Pulse Rate:  [62-85] 85 (05/31 0918) Resp:  [17-18] 18 (05/31 0918) BP: (131-226)/(29-51) 168/50 mmHg (05/31 0918) SpO2:  [98 %-100 %] 98 % (05/31 0918)  Intake/Output from previous day: 05/30 0701 - 05/31 0700 In: 360 [P.O.:360] Out: 525 [Urine:525] Intake/Output this shift:   Nutritional status: DIET DYS 2 Room service appropriate?: Yes; Fluid consistency:: Thin  Neurologic Exam: Mental Status: Alert, oriented, thought content appropriate. Speech fluent without evidence of aphasia. Able to follow 3 step commands without difficulty. Some bradykinesia noted.  Cranial Nerves: II: Discs flat bilaterally; Visual fields grossly normal, pupils equal, round, reactive to light and accommodation III,IV, VI: ptosis not present, extra-ocular motions intact bilaterally V,VII: smile symmetric, facial light touch sensation normal bilaterally VIII: hearing normal bilaterally IX,X: gag reflex present XI: bilateral shoulder shrug XII: midline tongue extension Motor: Able to lift all extremities against gravity with no focal weakness noted.  Sensory: Pinprick and light touch intact throughout, bilaterally Deep Tendon Reflexes: 2+ in the upper extremities and absent in the lower extremities.  Plantars: Right: downgoingLeft: downgoing  Lab Results: Basic Metabolic Panel:  Recent Labs Lab 09/22/15 1335 09/23/15 0200 09/24/15 0510  NA 139 139  --   K 3.6 3.4* 4.0  CL 102 104  --   CO2 32 32  --   GLUCOSE 128* 184*  --   BUN 20 18  --   CREATININE 1.46* 1.43* 1.63*  CALCIUM 9.2  8.7*  --     Liver Function Tests:  Recent Labs Lab 09/22/15 1335 09/23/15 0200  AST 17 19  ALT 19 17  ALKPHOS 105 84  BILITOT 0.5 0.4  PROT 6.5 5.5*  ALBUMIN 2.6* 2.2*   No results for input(s): LIPASE, AMYLASE in the last 168 hours.  Recent Labs Lab 09/22/15 1416  AMMONIA 18    CBC:  Recent Labs Lab 09/22/15 1335 09/23/15 0200 09/23/15 1506 09/24/15 0510  WBC 2.9* 1.9*  --  3.5*  NEUTROABS  --   --  1.8  --   HGB 9.2* 8.1*  --  10.3*  HCT 26.1* 23.1*  --  29.7*  MCV 92.9 93.1  --  94.8  PLT 140* 122*  --  152    Cardiac Enzymes:  Recent Labs Lab 09/22/15 1335 09/23/15 0200  TROPONINI <0.03 <0.03    Lipid Panel: No results for input(s): CHOL, TRIG, HDL, CHOLHDL, VLDL, LDLCALC in the last 168 hours.  CBG:  Recent Labs Lab 09/23/15 0122  GLUCAP 166*    Microbiology: Results for orders placed or performed during the hospital encounter of 09/22/15  Culture, blood (Routine X 2) w Reflex to ID Panel     Status: None (Preliminary result)   Collection Time: 09/22/15 10:13 PM  Result Value Ref Range Status   Specimen Description BLOOD RIGHT WRIST  Final   Special Requests BOTTLES DRAWN AEROBIC AND ANAEROBIC  Final   Culture NO GROWTH 2 DAYS  Final   Report Status PENDING  Incomplete  Culture, blood (Routine X 2) w Reflex to ID Panel  Status: None (Preliminary result)   Collection Time: 09/22/15 11:45 PM  Result Value Ref Range Status   Specimen Description BLOOD RIGHT WRIST  Final   Special Requests BOTTLES DRAWN AEROBIC AND ANAEROBIC 5ML  Final   Culture NO GROWTH 1 DAY  Final   Report Status PENDING  Incomplete  MRSA PCR Screening     Status: None   Collection Time: 09/23/15 12:10 AM  Result Value Ref Range Status   MRSA by PCR NEGATIVE NEGATIVE Final    Comment:        The GeneXpert MRSA Assay (FDA approved for NASAL specimens only), is one component of a comprehensive MRSA colonization surveillance program. It is not intended to  diagnose MRSA infection nor to guide or monitor treatment for MRSA infections.     Coagulation Studies:  Recent Labs  09/23/15 1506  LABPROT 14.2  INR 1.08    Imaging: Dg Chest 1 View  09/22/2015  CLINICAL DATA:  Altered mental status and lethargy EXAM: CHEST 1 VIEW COMPARISON:  09/07/2014 FINDINGS: Cardiac shadow is stable. Lungs are well aerated bilaterally. No focal infiltrate or sizable effusion is noted. No acute bone abnormality is noted IMPRESSION: No active disease. Electronically Signed   By: Alcide CleverMark  Lukens M.D.   On: 09/22/2015 15:36   Ct Head Wo Contrast  09/23/2015  CLINICAL DATA:  Unresponsive. EXAM: CT HEAD WITHOUT CONTRAST TECHNIQUE: Contiguous axial images were obtained from the base of the skull through the vertex without intravenous contrast. COMPARISON:  Head CT 12 hours prior.  Brain MRI 07/27/2015 FINDINGS: No intracranial hemorrhage or findings of involving ischemia. No change from prior exam. Unchanged atrophy and chronic small vessel ischemic change. No subdural or extra-axial collection. Opacification of both maxillary sinuses with fluid levels. Opacification of scattered ethmoid air cells, and sphenoid sinus mucosal thickening. No calvarial fracture. IMPRESSION: 1. No acute intracranial abnormality. No hemorrhage or evidence of a evolving ischemia compared to prior exam. 2. Maxillary sinus opacification with fluid levels, may be acute sinusitis. Electronically Signed   By: Rubye OaksMelanie  Ehinger M.D.   On: 09/23/2015 02:28   Ct Head Wo Contrast  09/22/2015  CLINICAL DATA:  Lethargy EXAM: CT HEAD WITHOUT CONTRAST TECHNIQUE: Contiguous axial images were obtained from the base of the skull through the vertex without intravenous contrast. COMPARISON:  07/27/2015 FINDINGS: Bony calvarium is intact. No gross soft tissue abnormality is seen. Mild mucosal thickening is noted within the maxillary and sphenoid sinuses. Mild atrophic changes are noted. No findings to suggest acute  hemorrhage, acute infarction or space-occupying mass lesion are noted. IMPRESSION: Atrophic changes without acute abnormality. Electronically Signed   By: Alcide CleverMark  Lukens M.D.   On: 09/22/2015 15:29   Mr Brain Wo Contrast  09/23/2015  CLINICAL DATA:  Altered mental status. EXAM: MRI HEAD WITHOUT CONTRAST TECHNIQUE: Multiplanar, multiecho pulse sequences of the brain and surrounding structures were obtained without intravenous contrast. COMPARISON:  CT head without contrast 09/23/2015. FINDINGS: There is layering debris within the occipital horns of the lateral ventricles bilaterally, worse on the left. This is new. There slight increase in ventricular size since the prior study. No acute infarct, hemorrhage, or other mass lesion is present. Moderate periventricular and subcortical white matter changes bilaterally are stable. No significant extra-axial fluid collection is present. The internal auditory canals are within normal limits bilaterally. T1 and T2 hyperintense material is present within the right Peters apex. There is minimal fluid in the right mastoid air cells. Internal auditory canals are normal. Flow is  present in the major intracranial arteries. Bilateral lens replacements are present. There is diffuse opacification of maxillary sinuses bilaterally. Restricted diffusion suggests infection. Diffuse mucosal thickening is present through the remaining paranasal sinuses. Mucosal disease is more prominent right than left in the sphenoid sinuses. IMPRESSION: 1. New layering material within the lateral ventricles. This may related to infection. Neoplasm is considered less likely. This does not appear to be hemorrhage. Recommend CSF sampling for further evaluation. 2. Stable atrophy and white matter disease otherwise reflects the sequela of chronic microvascular ischemia. 3. No acute infarct. 4. A diffuse sinus disease with restricted diffusion the maxillary sinuses bilaterally compatible with acute sinusitis.  These results will be called to the ordering clinician or representative by the Radiologist Assistant, and communication documented in the PACS or zVision Dashboard. Electronically Signed   By: Marin Roberts M.D.   On: 09/23/2015 14:04    Medications:  I have reviewed the patient's current medications. Scheduled: . aspirin EC  81 mg Oral Daily  . carvedilol  25 mg Oral BID WC  . cefTRIAXone (ROCEPHIN)  IV  2 g Intravenous Q12H  . cloNIDine  0.2 mg Oral BID  . dexamethasone  4 mg Intravenous Q8H  . divalproex  250 mg Oral BID  . hydrALAZINE      . levothyroxine  50 mcg Oral Q0600  . memantine  5 mg Oral Daily  . pantoprazole  40 mg Oral Daily  . risperiDONE  3 mg Oral QHS  . vancomycin  750 mg Intravenous Q24H    Assessment/Plan: Patient stable.  Started on Rocephin and Vanc.  Consent for LP obtained from daughter.  Patient consented to procedure as well.  Patient has had previous lumbar surgery.  LP attempted with patient in the lateral decub position but was unsuccessful.  EEG result pending.  Patient to be seen by Heme concerning myelosuppression.  Patient on Depakote, per daughter for behavioral issues.  May need to consider Depakote as a possible causative factor for myelosuppression.      Recommendations: 1.  LP under fluoro   LOS: 2 days   Thana Farr, MD Neurology [phone] 09/24/2015  11:02 AM

## 2015-09-24 NOTE — Progress Notes (Signed)
ANTIBIOTIC CONSULT NOTE - INITIAL  Pharmacy Consult for Unasyn  Indication: sinusitis  Allergies  Allergen Reactions  . Hydrocodone-Acetaminophen Nausea And Vomiting and Other (See Comments)    Reaction:  Dizziness   . Morphine And Related Other (See Comments)    Reaction:  Hallucinations     Patient Measurements: Height:  (170.2 cm) Weight: 180 lb 5 oz (81.789 kg) IBW/kg (Calculated) : 61.6 Adjusted Body Weight:   Vital Signs: Temp: 97.7 F (36.5 C) (05/31 2028) Temp Source: Oral (05/31 2028) BP: 170/54 mmHg (05/31 2028) Pulse Rate: 72 (05/31 2028) Intake/Output from previous day: 05/30 0701 - 05/31 0700 In: 360 [P.O.:360] Out: 525 [Urine:525] Intake/Output from this shift:    Labs:  Recent Labs  09/22/15 1335 09/23/15 0200 09/24/15 0510  WBC 2.9* 1.9* 3.5*  HGB 9.2* 8.1* 10.3*  PLT 140* 122* 152  CREATININE 1.46* 1.43* 1.63*   Estimated Creatinine Clearance: 31.8 mL/min (by C-G formula based on Cr of 1.63). No results for input(s): VANCOTROUGH, VANCOPEAK, VANCORANDOM, GENTTROUGH, GENTPEAK, GENTRANDOM, TOBRATROUGH, TOBRAPEAK, TOBRARND, AMIKACINPEAK, AMIKACINTROU, AMIKACIN in the last 72 hours.   Microbiology: Recent Results (from the past 720 hour(s))  Culture, blood (Routine X 2) w Reflex to ID Panel     Status: None (Preliminary result)   Collection Time: 09/22/15 10:13 PM  Result Value Ref Range Status   Specimen Description BLOOD RIGHT WRIST  Final   Special Requests BOTTLES DRAWN AEROBIC AND ANAEROBIC  Final   Culture NO GROWTH 2 DAYS  Final   Report Status PENDING  Incomplete  Culture, blood (Routine X 2) w Reflex to ID Panel     Status: None (Preliminary result)   Collection Time: 09/22/15 11:45 PM  Result Value Ref Range Status   Specimen Description BLOOD RIGHT WRIST  Final   Special Requests BOTTLES DRAWN AEROBIC AND ANAEROBIC  Final   Culture NO GROWTH 1 DAY  Final   Report Status PENDING  Incomplete  MRSA PCR Screening      Status: None   Collection Time: 09/23/15 12:10 AM  Result Value Ref Range Status   MRSA by PCR NEGATIVE NEGATIVE Final    Comment:        The GeneXpert MRSA Assay (FDA approved for NASAL specimens only), is one component of a comprehensive MRSA colonization surveillance program. It is not intended to diagnose MRSA infection nor to guide or monitor treatment for MRSA infections.   CSF culture     Status: None (Preliminary result)   Collection Time: 09/24/15  2:17 PM  Result Value Ref Range Status   Specimen Description CSF  Final   Special Requests NONE  Final   Gram Stain   Final    FEW WBC PRESENT, PREDOMINANTLY MONONUCLEAR NO ORGANISMS SEEN    Culture PENDING  Incomplete   Report Status PENDING  Incomplete    Medical History: Past Medical History  Diagnosis Date  . Diabetes mellitus   . GERD (gastroesophageal reflux disease)   . Hypertension   . Anemia   . Arthritis   . Hemorrhoids   . Hypertension   . CHF (congestive heart failure) (HCC)   . Renal disorder     Medications:  Prescriptions prior to admission  Medication Sig Dispense Refill Last Dose  . ALPRAZolam (XANAX) 0.5 MG tablet Take 1 tablet (0.5 mg total) by mouth 2 (two) times daily as needed for anxiety. 60 tablet 0 09/22/2015 at Unknown time  . aspirin EC 81 MG tablet Take 81  mg by mouth daily.   09/22/2015 at 0800  . carvedilol (COREG) 25 MG tablet Take 25 mg by mouth daily with breakfast.   09/22/2015 at 0800  . cloNIDine (CATAPRES) 0.2 MG tablet Take 0.2 mg by mouth 2 (two) times daily.   09/22/2015 at Unknown time  . divalproex (DEPAKOTE) 250 MG DR tablet Take 250 mg by mouth 2 (two) times daily.   09/22/2015 at Unknown time  . furosemide (LASIX) 40 MG tablet Take 40 mg by mouth 2 (two) times daily.   09/22/2015 at Unknown time  . levothyroxine (SYNTHROID, LEVOTHROID) 50 MCG tablet Take 1 tablet (50 mcg total) by mouth daily before breakfast. 90 tablet 1 09/22/2015 at Unknown time  . LINZESS 145 MCG CAPS  capsule Take 145 mcg by mouth daily before breakfast.    09/22/2015 at Unknown time  . memantine (NAMENDA) 5 MG tablet Take 5 mg by mouth daily.    09/22/2015 at Unknown time  . omeprazole (PRILOSEC) 40 MG capsule Take 1 capsule (40 mg total) by mouth daily. 30 capsule 0 09/22/2015 at Unknown time  . risperiDONE (RISPERDAL) 3 MG tablet Take 3 mg by mouth at bedtime.   09/21/2015 at Unknown time  . traZODone (DESYREL) 50 MG tablet Take 25 mg by mouth at bedtime.   09/21/2015 at Unknown time   Assessment: CrCl = 31.8 ml/min   Goal of Therapy:  resolution of infection  Plan:  Will start Unasyn 3 gm IV Q6H on 5/31.   Kavya Haag D 09/24/2015,10:03 PM

## 2015-09-24 NOTE — Progress Notes (Signed)
[facility] [place] [facility]   PATIENT NAME: [name] LoboMertis [name]    MR#:  [id number]  DATE OF BIRTH:  28-Mar-1936  SUBJECTIVE:  CHIEF COMPLAINT:   Chief Complaint  Patient presents with  . Altered Mental Status  Patient is 80 year old African-American female with past medical history significant for history of DVTs, hypertension, anemia, CHF, who presents to the hospital with altered mental status, sleepiness. On arrival to the hospital patient had CT scan of the head which was unremarkable for new findings, but atrophy. Patient's labs were concerning for Mild CO2 retention, hypokalemia, renal insufficiency, hyperglycemia, pancytopenia. Patient was noted to be hypothermic and was Managed initially on the bear hugger. She seems to be more alert today and able to answer  questions, follows commands, able to reason. Denies any pain, even headaches   . Review of Systems  Unable to perform ROS: medical condition  Constitutional: Negative for fever, chills and weight loss.  HENT: Negative for congestion.   Eyes: Negative for blurred vision and double vision.  Respiratory: Negative for cough, sputum production, shortness of breath and wheezing.   Cardiovascular: Negative for chest pain, palpitations, orthopnea, leg swelling and PND.  Gastrointestinal: Negative for nausea, vomiting, abdominal pain, diarrhea, constipation and blood in stool.  Genitourinary: Negative for dysuria, urgency, frequency and hematuria.  Musculoskeletal: Negative for falls.  Neurological: Negative for dizziness, tremors, focal weakness and headaches.  Endo/Heme/Allergies: Does not bruise/bleed easily.  Psychiatric/Behavioral: Negative for depression. The patient does not have insomnia.     VITAL SIGNS: Blood pressure 143/36, pulse 75, temperature 97.5 F (36.4 C), temperature source Oral, resp. rate 18, height 5\' 7"  (1.702 m), weight 81.789 kg (180 lb 5 oz), SpO2 99 %.  PHYSICAL  EXAMINATION:   GENERAL:  80 y.o.-year-old patient lying in the bed with no acute distress. Sitting in the bed, seems to be comfortable but somewhat sleepy EYES: Pupils equal, round, reactive to light and accommodation. No scleral icterus. Extraocular muscles intact.  HEENT: Head atraumatic, normocephalic. Oropharynx and nasopharynx clear. Significant nuchal rigidity  NECK:  Rigid, unable to flex, no jugular venous distention. No thyroid enlargement, no tenderness.  LUNGS: Normal breath sounds bilaterally, no wheezing, rales,rhonchi or crepitation. No use of accessory muscles of respiration.  CARDIOVASCULAR: S1, S2 normal. No murmurs, rubs, or gallops.  ABDOMEN: Soft, nontender, nondistended. Bowel sounds present. No organomegaly or mass.  EXTREMITIES: No pedal edema, cyanosis, or clubbing.  NEUROLOGIC: Cranial nerves II through XII are grossly intact. Muscle strength difficult to evaluate due to patient's condition. Sensation intact. Gait not checked.  PSYCHIATRIC: The patient is alert , able to answer some questions, follows simple commands.  SKIN: No obvious rash, lesion, or ulcer.   ORDERS/RESULTS REVIEWED:   CBC  Recent Labs Lab 09/22/15 1335 09/23/15 0200 09/23/15 1506 09/24/15 0510  WBC 2.9* 1.9*  --  3.5*  HGB 9.2* 8.1*  --  10.3*  HCT 26.1* 23.1*  --  29.7*  PLT 140* 122*  --  152  MCV 92.9 93.1  --  94.8  MCH 32.7 32.6  --  32.8  MCHC 35.2 35.0  --  34.6  RDW 16.8* 16.7*  --  16.6*  LYMPHSABS  --   --  1.0  --   MONOABS  --   --  0.2  --   EOSABS  --   --  0.0  --   BASOSABS  --   --  0.0  --    ------------------------------------------------------------------------------------------------------------------  Chemistries   Recent Labs Lab 09/22/15 1335 09/23/15 0200 09/24/15 0510  NA 139 139  --   K 3.6 3.4* 4.0  CL 102 104  --   CO2 32 32  --   GLUCOSE 128* 184*  --   BUN 20 18  --   CREATININE 1.46* 1.43* 1.63*  CALCIUM 9.2 8.7*  --   AST 17 19  --    ALT 19 17  --   ALKPHOS 105 84  --   BILITOT 0.5 0.4  --    ------------------------------------------------------------------------------------------------------------------ estimated creatinine clearance is 31.8 mL/min (by C-G formula based on Cr of 1.63). ------------------------------------------------------------------------------------------------------------------  Recent Labs  09/23/15 0200  TSH 2.040    Cardiac Enzymes  Recent Labs Lab 09/22/15 1335 09/23/15 0200  TROPONINI <0.03 <0.03   ------------------------------------------------------------------------------------------------------------------ Invalid input(s): POCBNP ---------------------------------------------------------------------------------------------------------------  RADIOLOGY: Dg Chest 1 View  09/22/2015  CLINICAL DATA:  Altered mental status and lethargy EXAM: CHEST 1 VIEW COMPARISON:  09/07/2014 FINDINGS: Cardiac shadow is stable. Lungs are well aerated bilaterally. No focal infiltrate or sizable effusion is noted. No acute bone abnormality is noted IMPRESSION: No active disease. Electronically Signed   By: Alcide Clever M.D.   On: 09/22/2015 15:36   Ct Head Wo Contrast  09/23/2015  CLINICAL DATA:  Unresponsive. EXAM: CT HEAD WITHOUT CONTRAST TECHNIQUE: Contiguous axial images were obtained from the base of the skull through the vertex without intravenous contrast. COMPARISON:  Head CT 12 hours prior.  Brain MRI 07/27/2015 FINDINGS: No intracranial hemorrhage or findings of involving ischemia. No change from prior exam. Unchanged atrophy and chronic small vessel ischemic change. No subdural or extra-axial collection. Opacification of both maxillary sinuses with fluid levels. Opacification of scattered ethmoid air cells, and sphenoid sinus mucosal thickening. No calvarial fracture. IMPRESSION: 1. No acute intracranial abnormality. No hemorrhage or evidence of a evolving ischemia compared to prior exam. 2.  Maxillary sinus opacification with fluid levels, may be acute sinusitis. Electronically Signed   By: Rubye Oaks M.D.   On: 09/23/2015 02:28   Ct Head Wo Contrast  09/22/2015  CLINICAL DATA:  Lethargy EXAM: CT HEAD WITHOUT CONTRAST TECHNIQUE: Contiguous axial images were obtained from the base of the skull through the vertex without intravenous contrast. COMPARISON:  07/27/2015 FINDINGS: Bony calvarium is intact. No gross soft tissue abnormality is seen. Mild mucosal thickening is noted within the maxillary and sphenoid sinuses. Mild atrophic changes are noted. No findings to suggest acute hemorrhage, acute infarction or space-occupying mass lesion are noted. IMPRESSION: Atrophic changes without acute abnormality. Electronically Signed   By: Alcide Clever M.D.   On: 09/22/2015 15:29   Mr Brain Wo Contrast  09/23/2015  CLINICAL DATA:  Altered mental status. EXAM: MRI HEAD WITHOUT CONTRAST TECHNIQUE: Multiplanar, multiecho pulse sequences of the brain and surrounding structures were obtained without intravenous contrast. COMPARISON:  CT head without contrast 09/23/2015. FINDINGS: There is layering debris within the occipital horns of the lateral ventricles bilaterally, worse on the left. This is new. There slight increase in ventricular size since the prior study. No acute infarct, hemorrhage, or other mass lesion is present. Moderate periventricular and subcortical white matter changes bilaterally are stable. No significant extra-axial fluid collection is present. The internal auditory canals are within normal limits bilaterally. T1 and T2 hyperintense material is present within the right Peters apex. There is minimal fluid in the right mastoid air cells. Internal auditory canals are normal. Flow is present in the major intracranial arteries. Bilateral lens replacements are  present. There is diffuse opacification of maxillary sinuses bilaterally. Restricted diffusion suggests infection. Diffuse mucosal  thickening is present through the remaining paranasal sinuses. Mucosal disease is more prominent right than left in the sphenoid sinuses. IMPRESSION: 1. New layering material within the lateral ventricles. This may related to infection. Neoplasm is considered less likely. This does not appear to be hemorrhage. Recommend CSF sampling for further evaluation. 2. Stable atrophy and white matter disease otherwise reflects the sequela of chronic microvascular ischemia. 3. No acute infarct. 4. A diffuse sinus disease with restricted diffusion the maxillary sinuses bilaterally compatible with acute sinusitis. These results will be called to the ordering clinician or representative by the Radiologist Assistant, and communication documented in the PACS or zVision Dashboard. Electronically Signed   By: Marin Roberts M.D.   On: 09/23/2015 14:04    EKG:  Orders placed or performed during the hospital encounter of 09/22/15  . ED EKG  . ED EKG  . EKG 12-Lead  . EKG 12-Lead  . ED EKG  . ED EKG  . EKG 12-Lead  . EKG 12-Lead    ASSESSMENT AND PLAN:  Active Problems:   Altered mental status #1 Metabolic encephalopathy , Appreciate neurology input, getting LP to rule out infection, bacterial, electroencephalogram was performed today and revealed generalized slowing, possible metabolic encephalopathy related. Patient's condition has improved significantly with therapy, continue Rocephin intravenously, vancomycin, dexamethasone for now, following results, microbiology so far revealed no growth. Getting cortisol level to be checked from the bladder which was drawn before dexamethasone was initiated to rule out adrenal insufficiency #2. Pancytopenia, appreciate oncologist input, was felt to be related to infectious process, so far, labs, including iron studies, ferritin, folate level were unremarkable, neurologist thinks that Depakote could have caused myelosuppression. Discontinue Depakote, as it was initiated  for behavioral control #3. Hypokalemia, supplemented intravenously, resolved, check magnesium level #4. Renal insufficiency, chronic, stage III, seems to be stable from before, follow with therapy #5. Hypothermia, initially thought due to sepsis, however, all cultures are negative so far, asking infectious disease specialist to see patient in consultation for recommendations, awaiting for cultures, continue antibody therapy for now, getting cortisol level to rule out adrenal insufficiency as a cause of hypothermia, the stage where she was found to be normal at 2.04 #6. Abnormal brain MRI with sediment in the lateral ventricles, unlikely bacterial meningitis per neurologist due to clinical presentation, awaiting for LP and cultures, get HIV status   Management plans discussed with the patient, family and they are in agreement.   DRUG ALLERGIES:  Allergies  Allergen Reactions  . Hydrocodone-Acetaminophen Nausea And Vomiting and Other (See Comments)    Reaction:  Dizziness   . Morphine And Related Other (See Comments)    Reaction:  Hallucinations     CODE STATUS:     Code Status Orders        Start     Ordered   09/22/15 2127  Full code   Continuous     09/22/15 2126    Code Status History    Date Active Date Inactive Code Status Order ID Comments User Context   07/24/2015  2:18 AM 07/28/2015  6:12 PM Full Code [id number]  Ihor Austin, MD Inpatient   09/03/2014  1:01 PM 09/05/2014  6:09 PM Full Code [id number]  Enedina Finner, MD Inpatient    Advance Directive Documentation        Most Recent Value   Type of Advance Directive  Healthcare Power of [place]  Pre-existing out of facility DNR order (yellow form or pink MOST form)     "MOST" Form in Place?        TOTAL TIME TAKING CARE OF THIS PATIENT: 45 minutes.  Discussed with neurologist, oncologist, infectious disease specialist today  Zyionna Pesce M.D on 09/24/2015 at 2:39 PM  Between 7am to 6pm - Pager - [phone]  After  6pm go to [email or web address] - password [email or web address] [facility]  [facility]  Office  [phone]  CC: Primary care physician; Sherlene Shams, MD

## 2015-09-24 NOTE — Evaluation (Addendum)
Clinical/Bedside Swallow Evaluation Patient Details  Name: [name] MRN: [id number] Date of Birth: [birth year]  Today's Date: 09/24/2015 Time: SLP Start Time (ACUTE ONLY): 0800 SLP Stop Time (ACUTE ONLY): 0900 SLP Time Calculation (min) (ACUTE ONLY): 60 min  Past Medical History:  Past Medical History  Diagnosis Date  . Diabetes mellitus   . GERD (gastroesophageal reflux disease)   . Hypertension   . Anemia   . Arthritis   . Hemorrhoids   . Hypertension   . CHF (congestive heart failure) (HCC)   . Renal disorder    Past Surgical History:  Past Surgical History  Procedure Laterality Date  . Cholecystectomy    . Abdominal hysterectomy    . Back surgery    . Arthroplasty w/ arthroscopy medial / lateral compartment knee  Feb 2011    [name]  . Hemorrhoid surgery  12/19/12   HPI:  Pt is a 80 year old female presenting after being found lethargic by family. Has had a similar episode in the past.Pt has multiple medical issues including GERD, DM, HTN, SHF, renal dis., and Vascular Dementia (per chart report/NSG).  Currently no focal findings on neurological examination. Bradykinesia likely related to medications.MRI shows some layering in the lateral ventricles. Due to MRI results there does remain some concern for infection. Clinically this does not appear to be the case but will investigate further since with evidence of myelosuppression and may not have a typical presentation. Pt is awake this morning eating her breakfast w/ assistance. Pt does seem less aware of oral clearing and requires cues for such by the NSG student; min. impulsive behavior is noted as well. Pt has exhibited "gulping"  swallowing w/ NSG.    Assessment / Plan / Recommendation Clinical Impression   Pt appears to adequately tolerate trials of thin liquids via Cup and small trials of broken down, soft solids w/ no overt s/s of aspiration noted. However, pt exhibited increased oral phase time and holding w/  the increased textured foods and required verbal cues and alternating bites of food w/ small sips of liquids or wet food(applesauce) to aid oral clearing. This presentation could be related to her Cognitive decline(Dementia) baseline. Pt required feeding support and facilitation w/ cup drinking(hand over hand holding the cup). Due to pt's oral phase presentation and baseline Cognitive decline, pt is at min risk for aspiration, but w/ aspiration precautions and supervision/monitoring during meals for oral clearing, pt appears able to tolerate current diet as ordered, Dysphagia 2 w/ thin liquids. Recommend meds in Puree - crushed as able. ST will f/u w/ pt's status as she improves for diet consistency upgrade when appropriate.  NSG updated.     Aspiration Risk  Mild aspiration risk (d/t the decreased attention during the oral phase)    Diet Recommendation  Dysphagia 2, thin liquids; aspiration precautions including No Straws and monitoring for oral clearing b/t trials; reduce distractions during meals; 100% supervision at this time during meals.   Medication Administration: Whole meds with puree (or crushed if necessary/able to; liquid form)    Other  Recommendations Recommended Consults:  (Dietician) Oral Care Recommendations: Oral care BID;Staff/trained caregiver to provide oral care   Follow up Recommendations   (TBD)    Frequency and Duration min 2x/week  2 weeks       Prognosis Prognosis for Safe Diet Advancement: Fair (-Good) Barriers to Reach Goals: Cognitive deficits      Swallow Study   General Date of Onset: 09/22/15 HPI: Pt is a  79 year old female presenting after being found lethargic by family. Has had a similar episode in the past.Pt has multiple medical issues including GERD, DM, HTN, SHF, renal dis., and Vascular Dementia (per chart report/NSG).  Currently no focal findings on neurological examination. Bradykinesia likely related to medications.MRI shows some layering in  the lateral ventricles. Due to MRI results there does remain some concern for infection. Clinically this does not appear to be the case but will investigate further since with evidence of myelosuppression and may not have a typical presentation. Pt is awake this morning eating her breakfast w/ assistance. Pt does seem less aware of oral clearing and requires cues for such by the NSG student; min. impulsive behavior is noted as well. Pt has exhibited "gulping"  swallowing w/ NSG.  Type of Study: Bedside Swallow Evaluation Previous Swallow Assessment: none Diet Prior to this Study: Dysphagia 2 (chopped);Thin liquids (since admission) Temperature Spikes Noted: No (wbc 1.9; 3.5 since admission) Respiratory Status: Room air History of Recent Intubation: No Behavior/Cognition: Alert;Cooperative;Pleasant mood;Confused;Requires cueing Oral Cavity Assessment: Within Functional Limits Oral Care Completed by SLP: No (eating meal) Oral Cavity - Dentition: Adequate natural dentition Vision: Functional for self-feeding Self-Feeding Abilities: Able to feed self;Needs assist;Needs set up Patient Positioning: Upright in bed Baseline Vocal Quality: Low vocal intensity Volitional Cough: Strong Volitional Swallow: Able to elicit    Oral/Motor/Sensory Function Overall Oral Motor/Sensory Function: Within functional limits (and w/ boluses)   Ice Chips Ice chips: Not tested   Thin Liquid Thin Liquid: Within functional limits Presentation: Cup;Self Fed (assisted sec. to impulsive behavior; reduced attention) Other Comments: cues to monitor    Nectar Thick Nectar Thick Liquid: Not tested   Honey Thick Honey Thick Liquid: Not tested   Puree Puree: Impaired (3 trials) Presentation: Spoon (fed) Oral Phase Impairments: Poor awareness of bolus Pharyngeal Phase Impairments:  (none) Other Comments: reduced oral awareness/attention for clearing mouth fully; verbal cues and alternating foods/liquids aided    Solid   GO   Solid: Impaired (soft, broken down foods; 2 trials) Presentation: Spoon (fed) Oral Phase Impairments: Poor awareness of bolus Pharyngeal Phase Impairments:  (none) Other Comments: same as with puree trials       Jerilynn Som, MS, CCC-SLP  Watson,Katherine 09/24/2015,11:56 AM

## 2015-09-24 NOTE — Consult Note (Signed)
[facility] Infectious Disease     Reason for Consult:AMS, possible meningitis   Referring Physician: Lenetta Quaker Date of Admission:  09/22/2015   Active Problems:   Altered mental status   Pressure ulcer   HPI: [name] is a 80 y.o. female admitted 5/29 from [facility] with AMS noted by family members. SHe has a hx of prior CVA, htn, chf, ckd and anemia. CUrrently her friend is at the bedside and can provide little history. The patient remains confused but is awake and able to converse. She cannot tell me where she is, when it is or why she is here. On admission wbc was low as was hgn and plts. She was actually hypotermic but has normalized now. Started on vanco and zosyn.  Lc MRI showed layering material in the lateral ventricle but LP shows no wbc, now rbc and nml protein.   Past Medical History  Diagnosis Date  . Diabetes mellitus   . GERD (gastroesophageal reflux disease)   . Hypertension   . Anemia   . Arthritis   . Hemorrhoids   . Hypertension   . CHF (congestive heart failure) (HCC)   . Renal disorder    Past Surgical History  Procedure Laterality Date  . Cholecystectomy    . Abdominal hysterectomy    . Back surgery    . Arthroplasty w/ arthroscopy medial / lateral compartment knee  Feb 2011    [name]  . Hemorrhoid surgery  12/19/12   Social History  Substance Use Topics  . Smoking status: Former Smoker -- 30 years    Types: Cigarettes    Quit date: 09/28/2001  . Smokeless tobacco: Current User    Types: Snuff  . Alcohol Use: No   Family History  Problem Relation Age of Onset  . Heart disease Mother   . Hypertension Mother   . Cancer Mother     Allergies:  Allergies  Allergen Reactions  . Hydrocodone-Acetaminophen Nausea And Vomiting and Other (See Comments)    Reaction:  Dizziness   . Morphine And Related Other (See Comments)    Reaction:  Hallucinations     Current antibiotics: Antibiotics Given (last 72 hours)    Date/Time Action  Medication Dose Rate   09/23/15 0151 Given   vancomycin (VANCOCIN) IVPB 1000 mg/200 mL premix 1,000 mg 200 mL/hr   09/23/15 0151 Given   piperacillin-tazobactam (ZOSYN) IVPB 3.375 g 3.375 g 12.5 mL/hr   09/23/15 1333 Given   piperacillin-tazobactam (ZOSYN) IVPB 3.375 g 3.375 g 12.5 mL/hr   09/23/15 1333 Given   vancomycin (VANCOCIN) IVPB 1000 mg/200 mL premix 1,000 mg 200 mL/hr   09/23/15 1617 Given   cefTRIAXone (ROCEPHIN) 2 g in dextrose 5 % 50 mL IVPB 2 g 100 mL/hr   09/23/15 2059 Given   cefTRIAXone (ROCEPHIN) 2 g in dextrose 5 % 50 mL IVPB 2 g 100 mL/hr   09/24/15 1148 Given   cefTRIAXone (ROCEPHIN) 2 g in dextrose 5 % 50 mL IVPB 2 g 100 mL/hr   09/24/15 1325 Given   vancomycin (VANCOCIN) IVPB 750 mg/150 ml premix 750 mg 150 mL/hr      MEDICATIONS: . aspirin EC  81 mg Oral Daily  . carvedilol  25 mg Oral BID WC  . cefTRIAXone (ROCEPHIN)  IV  2 g Intravenous Q12H  . cloNIDine  0.2 mg Oral BID  . dexamethasone  4 mg Intravenous Q8H  . doxycycline (VIBRAMYCIN) IV  100 mg Intravenous Q12H  . levothyroxine  50  mcg Oral Q0600  . memantine  5 mg Oral Daily  . pantoprazole  40 mg Oral Daily  . pantoprazole  40 mg Oral Once  . polyethylene glycol  17 g Oral Once  . risperiDONE  3 mg Oral QHS  . vancomycin  750 mg Intravenous Q24H    Review of Systems - 11 systems reviewed and negative per HPI   OBJECTIVE: Temp:  [97.5 F (36.4 C)-98.4 F (36.9 C)] 97.7 F (36.5 C) (05/31 2028) Pulse Rate:  [64-85] 72 (05/31 2028) Resp:  [17-18] 18 (05/31 2028) BP: (143-179)/(35-54) 170/54 mmHg (05/31 2028) SpO2:  [98 %-100 %] 99 % (05/31 2028) Physical Exam  Constitutional: frail, confused HENT: [place]/AT, PERRLA, no scleral icterus Mouth/Throat: Oropharynx is clear and dry. No oropharyngeal exudate.  Cardiovascular: Normal rate, regular rhythm and normal heart sounds.   Pulmonary/Chest: Effort normal and breath sounds normal. No respiratory distress.  Neck = supple, no nuchal  rigidity Abdominal: Soft. Bowel sounds are normal.  exhibits no distension. There is no tenderness.  Lymphadenopathy: no cervical adenopathy. No axillary adenopathy Neurological:cannot tell me where she is, when it is or why she is here Able to move all 4 ext. Psychiatric: confused LABS: Results for orders placed or performed during the hospital encounter of 09/22/15 (from the past 48 hour(s))  Culture, blood (Routine X 2) w Reflex to ID Panel     Status: None (Preliminary result)   Collection Time: 09/22/15 10:13 PM  Result Value Ref Range   Specimen Description BLOOD RIGHT WRIST    Special Requests BOTTLES DRAWN AEROBIC AND ANAEROBIC 5ML    Culture NO GROWTH 2 DAYS    Report Status PENDING   Culture, blood (Routine X 2) w Reflex to ID Panel     Status: None (Preliminary result)   Collection Time: 09/22/15 11:45 PM  Result Value Ref Range   Specimen Description BLOOD RIGHT WRIST    Special Requests BOTTLES DRAWN AEROBIC AND ANAEROBIC 5ML    Culture NO GROWTH 1 DAY    Report Status PENDING   MRSA PCR Screening     Status: None   Collection Time: 09/23/15 12:10 AM  Result Value Ref Range   MRSA by PCR NEGATIVE NEGATIVE    Comment:        The GeneXpert MRSA Assay (FDA approved for NASAL specimens only), is one component of a comprehensive MRSA colonization surveillance program. It is not intended to diagnose MRSA infection nor to guide or monitor treatment for MRSA infections.   Glucose, capillary     Status: Abnormal   Collection Time: 09/23/15  1:22 AM  Result Value Ref Range   Glucose-Capillary 166 (H) 65 - 99 mg/dL  Blood gas, arterial     Status: Abnormal   Collection Time: 09/23/15  1:54 AM  Result Value Ref Range   FIO2 0.21    Delivery systems ROOM AIR    pH, Arterial 7.44 7.350 - 7.450   pCO2 arterial 49 (H) 32.0 - 48.0 mmHg   pO2, Arterial 56 (L) 83.0 - 108.0 mmHg   Bicarbonate 33.3 (H) 21.0 - 28.0 mEq/L   Acid-Base Excess 7.9 (H) 0.0 - 3.0 mmol/L   O2  Saturation 89.9 %   Patient temperature 37.0    Collection site RIGHT RADIAL    Sample type ARTERIAL DRAW    Allens test (pass/fail) POSITIVE (A) PASS  Lactic acid, plasma     Status: None   Collection Time: 09/23/15  2:00 AM  Result Value  Ref Range   Lactic Acid, Venous 1.6 0.5 - 2.0 mmol/L  Troponin I     Status: None   Collection Time: 09/23/15  2:00 AM  Result Value Ref Range   Troponin I <0.03 <0.031 ng/mL    Comment:        NO INDICATION OF MYOCARDIAL INJURY.   CBC     Status: Abnormal   Collection Time: 09/23/15  2:00 AM  Result Value Ref Range   WBC 1.9 (L) 3.6 - 11.0 K/uL   RBC 2.48 (L) 3.80 - 5.20 MIL/uL   Hemoglobin 8.1 (L) 12.0 - 16.0 g/dL   HCT 23.1 (L) 35.0 - 47.0 %   MCV 93.1 80.0 - 100.0 fL   MCH 32.6 26.0 - 34.0 pg   MCHC 35.0 32.0 - 36.0 g/dL   RDW 16.7 (H) 11.5 - 14.5 %   Platelets 122 (L) 150 - 440 K/uL  Comprehensive metabolic panel     Status: Abnormal   Collection Time: 09/23/15  2:00 AM  Result Value Ref Range   Sodium 139 135 - 145 mmol/L   Potassium 3.4 (L) 3.5 - 5.1 mmol/L   Chloride 104 101 - 111 mmol/L   CO2 32 22 - 32 mmol/L   Glucose, Bld 184 (H) 65 - 99 mg/dL   BUN 18 6 - 20 mg/dL   Creatinine, Ser 1.43 (H) 0.44 - 1.00 mg/dL   Calcium 8.7 (L) 8.9 - 10.3 mg/dL   Total Protein 5.5 (L) 6.5 - 8.1 g/dL   Albumin 2.2 (L) 3.5 - 5.0 g/dL   AST 19 15 - 41 U/L   ALT 17 14 - 54 U/L   Alkaline Phosphatase 84 38 - 126 U/L   Total Bilirubin 0.4 0.3 - 1.2 mg/dL   GFR calc non Af Amer 34 (L) >60 mL/min   GFR calc Af Amer 40 (L) >60 mL/min    Comment: (NOTE) The eGFR has been calculated using the CKD EPI equation. This calculation has not been validated in all clinical situations. eGFR's persistently <60 mL/min signify possible Chronic Kidney Disease.    Anion gap 3 (L) 5 - 15  Valproic acid level     Status: Abnormal   Collection Time: 09/23/15  2:00 AM  Result Value Ref Range   Valproic Acid Lvl 36 (L) 50.0 - 100.0 ug/mL  TSH     Status:  None   Collection Time: 09/23/15  2:00 AM  Result Value Ref Range   TSH 2.040 0.350 - 4.500 uIU/mL  APTT     Status: None   Collection Time: 09/23/15  3:06 PM  Result Value Ref Range   aPTT 36 24 - 36 seconds  Protime-INR     Status: None   Collection Time: 09/23/15  3:06 PM  Result Value Ref Range   Prothrombin Time 14.2 11.4 - 15.0 seconds   INR 1.08   Differential     Status: None   Collection Time: 09/23/15  3:06 PM  Result Value Ref Range   Neutro Abs 1.8 1.4 - 6.5 K/uL   Lymphs Abs 1.0 1.0 - 3.6 K/uL   Monocytes Absolute 0.2 0.2 - 0.9 K/uL   Eosinophils Absolute 0.0 0 - 0.7 K/uL   Basophils Absolute 0.0 0 - 0.1 K/uL   Neutrophils Relative % 59 %   Lymphocytes Relative 32 %   Monocytes Relative 8 %   Eosinophils Relative 0 %   Basophils Relative 0 %   Band Neutrophils 0 %  Metamyelocytes Relative 0 %   Myelocytes 0 %   Promyelocytes Absolute 1 %   Blasts 0 %   nRBC 0 0 /100 WBC   Other 0 %  Folate     Status: None   Collection Time: 09/23/15  3:06 PM  Result Value Ref Range   Folate 9.1 >5.9 ng/mL  Ferritin     Status: None   Collection Time: 09/23/15  3:06 PM  Result Value Ref Range   Ferritin 63 11 - 307 ng/mL  Iron and TIBC     Status: None   Collection Time: 09/23/15  3:06 PM  Result Value Ref Range   Iron 81 28 - 170 ug/dL   TIBC 296 250 - 450 ug/dL   Saturation Ratios 27 10.4 - 31.8 %   UIBC 215 ug/dL  CBC     Status: Abnormal   Collection Time: 09/24/15  5:10 AM  Result Value Ref Range   WBC 3.5 (L) 3.6 - 11.0 K/uL   RBC 3.14 (L) 3.80 - 5.20 MIL/uL   Hemoglobin 10.3 (L) 12.0 - 16.0 g/dL   HCT 29.7 (L) 35.0 - 47.0 %   MCV 94.8 80.0 - 100.0 fL   MCH 32.8 26.0 - 34.0 pg   MCHC 34.6 32.0 - 36.0 g/dL   RDW 16.6 (H) 11.5 - 14.5 %   Platelets 152 150 - 440 K/uL  Potassium     Status: None   Collection Time: 09/24/15  5:10 AM  Result Value Ref Range   Potassium 4.0 3.5 - 5.1 mmol/L  Creatinine, serum     Status: Abnormal   Collection Time:  09/24/15  5:10 AM  Result Value Ref Range   Creatinine, Ser 1.63 (H) 0.44 - 1.00 mg/dL   GFR calc non Af Amer 29 (L) >60 mL/min   GFR calc Af Amer 34 (L) >60 mL/min    Comment: (NOTE) The eGFR has been calculated using the CKD EPI equation. This calculation has not been validated in all clinical situations. eGFR's persistently <60 mL/min signify possible Chronic Kidney Disease.   Rapid HIV screen (HIV 1/2 Ab+Ag)     Status: None   Collection Time: 09/24/15  5:10 AM  Result Value Ref Range   HIV-1 P24 Antigen - HIV24 NON REACTIVE NON REACTIVE   HIV 1/2 Antibodies NON REACTIVE NON REACTIVE   Interpretation (HIV Ag Ab)      A non reactive test result means that HIV 1 or HIV 2 antibodies and HIV 1 p24 antigen were not detected in the specimen.  Cortisol     Status: None   Collection Time: 09/24/15  8:08 AM  Result Value Ref Range   Cortisol, Plasma 1.8 ug/dL    Comment: (NOTE) AM    6.7 - 22.6 ug/dL PM   <10.0       ug/dL Performed at [place] and glucose, CSF     Status: Abnormal   Collection Time: 09/24/15  2:17 PM  Result Value Ref Range   Glucose, CSF 141 (H) 40 - 70 mg/dL   Total  Protein, CSF 25 15 - 45 mg/dL  CSF cell count with differential     Status: Abnormal   Collection Time: 09/24/15  2:17 PM  Result Value Ref Range   Tube # 3    Color, CSF COLORLESS COLORLESS   Appearance, CSF CLEAR (A) CLEAR   RBC Count, CSF 5 (H) 0 - 3 /cu mm   WBC, CSF  0 /cu mm   Segmented Neutrophils-CSF 0 %   Lymphs, CSF 0 %   Monocyte-Macrophage-Spinal Fluid 0 %   Eosinophils, CSF 0 %   Other Cells, CSF 0   CSF culture     Status: None (Preliminary result)   Collection Time: 09/24/15  2:17 PM  Result Value Ref Range   Specimen Description CSF    Special Requests NONE    Gram Stain      FEW WBC PRESENT, PREDOMINANTLY MONONUCLEAR NO ORGANISMS SEEN    Culture PENDING    Report Status PENDING    No components found for: ESR, C REACTIVE PROTEIN MICRO: Recent  Results (from the past 720 hour(s))  Culture, blood (Routine X 2) w Reflex to ID Panel     Status: None (Preliminary result)   Collection Time: 09/22/15 10:13 PM  Result Value Ref Range Status   Specimen Description BLOOD RIGHT WRIST  Final   Special Requests BOTTLES DRAWN AEROBIC AND ANAEROBIC 5ML  Final   Culture NO GROWTH 2 DAYS  Final   Report Status PENDING  Incomplete  Culture, blood (Routine X 2) w Reflex to ID Panel     Status: None (Preliminary result)   Collection Time: 09/22/15 11:45 PM  Result Value Ref Range Status   Specimen Description BLOOD RIGHT WRIST  Final   Special Requests BOTTLES DRAWN AEROBIC AND ANAEROBIC 5ML  Final   Culture NO GROWTH 1 DAY  Final   Report Status PENDING  Incomplete  MRSA PCR Screening     Status: None   Collection Time: 09/23/15 12:10 AM  Result Value Ref Range Status   MRSA by PCR NEGATIVE NEGATIVE Final    Comment:        The GeneXpert MRSA Assay (FDA approved for NASAL specimens only), is one component of a comprehensive MRSA colonization surveillance program. It is not intended to diagnose MRSA infection nor to guide or monitor treatment for MRSA infections.   CSF culture     Status: None (Preliminary result)   Collection Time: 09/24/15  2:17 PM  Result Value Ref Range Status   Specimen Description CSF  Final   Special Requests NONE  Final   Gram Stain   Final    FEW WBC PRESENT, PREDOMINANTLY MONONUCLEAR NO ORGANISMS SEEN    Culture PENDING  Incomplete   Report Status PENDING  Incomplete    IMAGING: Dg Chest 1 View  09/22/2015  CLINICAL DATA:  Altered mental status and lethargy EXAM: CHEST 1 VIEW COMPARISON:  09/07/2014 FINDINGS: Cardiac shadow is stable. Lungs are well aerated bilaterally. No focal infiltrate or sizable effusion is noted. No acute bone abnormality is noted IMPRESSION: No active disease. Electronically Signed   By: Inez Catalina M.D.   On: 09/22/2015 15:36   Ct Head Wo Contrast  09/23/2015  CLINICAL DATA:   Unresponsive. EXAM: CT HEAD WITHOUT CONTRAST TECHNIQUE: Contiguous axial images were obtained from the base of the skull through the vertex without intravenous contrast. COMPARISON:  Head CT 12 hours prior.  Brain MRI 07/27/2015 FINDINGS: No intracranial hemorrhage or findings of involving ischemia. No change from prior exam. Unchanged atrophy and chronic small vessel ischemic change. No subdural or extra-axial collection. Opacification of both maxillary sinuses with fluid levels. Opacification of scattered ethmoid air cells, and sphenoid sinus mucosal thickening. No calvarial fracture. IMPRESSION: 1. No acute intracranial abnormality. No hemorrhage or evidence of a evolving ischemia compared to prior exam. 2. Maxillary sinus opacification with fluid levels, may be acute  sinusitis. Electronically Signed   By: Jeb Levering M.D.   On: 09/23/2015 02:28   Ct Head Wo Contrast  09/22/2015  CLINICAL DATA:  Lethargy EXAM: CT HEAD WITHOUT CONTRAST TECHNIQUE: Contiguous axial images were obtained from the base of the skull through the vertex without intravenous contrast. COMPARISON:  07/27/2015 FINDINGS: Bony calvarium is intact. No gross soft tissue abnormality is seen. Mild mucosal thickening is noted within the maxillary and sphenoid sinuses. Mild atrophic changes are noted. No findings to suggest acute hemorrhage, acute infarction or space-occupying mass lesion are noted. IMPRESSION: Atrophic changes without acute abnormality. Electronically Signed   By: Inez Catalina M.D.   On: 09/22/2015 15:29   Mr Brain Wo Contrast  09/23/2015  CLINICAL DATA:  Altered mental status. EXAM: MRI HEAD WITHOUT CONTRAST TECHNIQUE: Multiplanar, multiecho pulse sequences of the brain and surrounding structures were obtained without intravenous contrast. COMPARISON:  CT head without contrast 09/23/2015. FINDINGS: There is layering debris within the occipital horns of the lateral ventricles bilaterally, worse on the left. This is new.  There slight increase in ventricular size since the prior study. No acute infarct, hemorrhage, or other mass lesion is present. Moderate periventricular and subcortical white matter changes bilaterally are stable. No significant extra-axial fluid collection is present. The internal auditory canals are within normal limits bilaterally. T1 and T2 hyperintense material is present within the right Peters apex. There is minimal fluid in the right mastoid air cells. Internal auditory canals are normal. Flow is present in the major intracranial arteries. Bilateral lens replacements are present. There is diffuse opacification of maxillary sinuses bilaterally. Restricted diffusion suggests infection. Diffuse mucosal thickening is present through the remaining paranasal sinuses. Mucosal disease is more prominent right than left in the sphenoid sinuses. IMPRESSION: 1. New layering material within the lateral ventricles. This may related to infection. Neoplasm is considered less likely. This does not appear to be hemorrhage. Recommend CSF sampling for further evaluation. 2. Stable atrophy and white matter disease otherwise reflects the sequela of chronic microvascular ischemia. 3. No acute infarct. 4. A diffuse sinus disease with restricted diffusion the maxillary sinuses bilaterally compatible with acute sinusitis. These results will be called to the ordering clinician or representative by the Radiologist Assistant, and communication documented in the PACS or zVision Dashboard. Electronically Signed   By: San Morelle M.D.   On: 09/23/2015 14:04   Dg Fluoro Guided Loc Of Needle/cath Tip For Spinal Inject Lt  09/24/2015  CLINICAL DATA:  Meningitis. EXAM: DIAGNOSTIC LUMBAR PUNCTURE UNDER FLUOROSCOPIC GUIDANCE FLUOROSCOPY TIME:  Radiation Exposure Index (as provided by the fluoroscopic device): 21.9 mGy PROCEDURE: Informed consent was obtained from the patient prior to the procedure, including potential complications  of headache, allergy, and pain. With the patient prone, the lower back was prepped with Betadine. 1% Lidocaine was used for local anesthesia. Lumbar puncture was performed at the L4-L5 level using a 22 gauge needle with return of clear CSF with an opening pressure of 17 cm water. Thirteen ml of CSF were obtained for laboratory studies. The patient tolerated the procedure well and there were no apparent complications. IMPRESSION: Successful fluoroscopically directed lumbar puncture. Electronically Signed   By: Marcello Moores  Register   On: 09/24/2015 14:37    Assessment:   [name] is a 80 y.o. female with AMS, leukopenia, anemia and TCP as well as MRI showing sinus dz and layering material in the lateral ventricles. She was initally hypothermic but now is normothermic. BCX uCx neg.  Has had similar  issues and admissions in past and some notes of hx of dementia and schizoaffective disorder Recommendations No evidence meningitis DC vanco zosyn and ceftriaxone Start unasyn for sinus disease - can transition to augmenti at dc.  Would give a 10 day course. Would give a 7 day course of doxy as well for atypical coverage,esp with the leukopenia.   Thank you very much for allowing me to participate in the care of this patient. Please call with questions.   Cheral Marker. Ola Spurr, MD

## 2015-09-24 NOTE — Procedures (Signed)
ELECTROENCEPHALOGRAM REPORT   Patient: [name]       Room #: 111A-AA EEG No. ID: [id number] Age: 80 y.o.        Sex: female Referring Physician: Winona LegatoVaickute Report Date:  09/24/2015        Interpreting Physician: Thana FarrEYNOLDS, Lukasz Rogus  History: [name] is an 80 y.o. female with altered mental status  Medications:  Scheduled: . aspirin EC  81 mg Oral Daily  . carvedilol  25 mg Oral BID WC  . cefTRIAXone (ROCEPHIN)  IV  2 g Intravenous Q12H  . cloNIDine  0.2 mg Oral BID  . dexamethasone  4 mg Intravenous Q8H  . divalproex  250 mg Oral BID  . hydrALAZINE      . levothyroxine  50 mcg Oral Q0600  . memantine  5 mg Oral Daily  . pantoprazole  40 mg Oral Daily  . risperiDONE  3 mg Oral QHS  . vancomycin  750 mg Intravenous Q24H    Conditions of Recording:  This is a 16 channel EEG carried out with the patient in the awake and drowsy states.  Description:  The waking background activity consists of a low voltage, symmetrical, fairly well organized, 6-7 Hz theta activity, seen from the parieto-occipital and posterior temporal regions.  There is an absence of faster rhythms anteriorly as well.  Mostly theta activity is seen from the central and temporal regions as well with only some rare alpha activity intermixed.   No stage II sleep is not obtained. No epileptiform activity is noted.   Hyperventilation was not performed. Intermittent photic stimulation was performed but failed to illicit any change in the tracing.     IMPRESSION: This EEG is characterized by slowing which is consistent with normal drowse.  Can not rule out the possibility of slowing related to general cerebral disturbance such as a metabolic encephalopathy.  Clinical correlation recommended.  No epileptiform activity is noted.       Thana FarrLeslie Raheem Kolbe, MD Neurology [phone] 09/24/2015, 11:32 AM

## 2015-09-25 DIAGNOSIS — N183 Chronic kidney disease, stage 3 unspecified: Secondary | ICD-10-CM

## 2015-09-25 DIAGNOSIS — D61818 Other pancytopenia: Secondary | ICD-10-CM

## 2015-09-25 DIAGNOSIS — A419 Sepsis, unspecified organism: Secondary | ICD-10-CM

## 2015-09-25 DIAGNOSIS — E876 Hypokalemia: Secondary | ICD-10-CM

## 2015-09-25 DIAGNOSIS — T68XXXA Hypothermia, initial encounter: Secondary | ICD-10-CM

## 2015-09-25 LAB — BASIC METABOLIC PANEL
ANION GAP: 8 (ref 5–15)
BUN: 24 mg/dL — ABNORMAL HIGH (ref 6–20)
CHLORIDE: 102 mmol/L (ref 101–111)
CO2: 28 mmol/L (ref 22–32)
Calcium: 9.7 mg/dL (ref 8.9–10.3)
Creatinine, Ser: 1.43 mg/dL — ABNORMAL HIGH (ref 0.44–1.00)
GFR calc Af Amer: 40 mL/min — ABNORMAL LOW (ref >60)
GFR, EST NON AFRICAN AMERICAN: 34 mL/min — AB (ref >60)
GLUCOSE: 258 mg/dL — AB (ref 65–99)
POTASSIUM: 4.1 mmol/L (ref 3.5–5.1)
Sodium: 138 mmol/L (ref 135–145)

## 2015-09-25 LAB — DIRECT PLATELET ANTIBODY
PLT ASSOC. ANTI-IA/IIA: NEGATIVE
PLT ASSOC. ANTI-IB/IX: NEGATIVE
PLT ASSOC. ANTI-IIB/IIIA: POSITIVE — AB

## 2015-09-25 LAB — CBC
HEMATOCRIT: 29.8 % — AB (ref 35.0–47.0)
HEMOGLOBIN: 10.5 g/dL — AB (ref 12.0–16.0)
MCH: 32.9 pg (ref 26.0–34.0)
MCHC: 35.3 g/dL (ref 32.0–36.0)
MCV: 93.3 fL (ref 80.0–100.0)
PLATELETS: 150 10*3/uL (ref 150–440)
RBC: 3.19 MIL/uL — ABNORMAL LOW (ref 3.80–5.20)
RDW: 17 % — ABNORMAL HIGH (ref 11.5–14.5)
WBC: 6.6 10*3/uL (ref 3.6–11.0)

## 2015-09-25 LAB — CORTISOL: Cortisol, Plasma: 1.3 ug/dL

## 2015-09-25 LAB — VDRL, CSF: SYPHILIS VDRL QUANT CSF: NONREACTIVE

## 2015-09-25 MED ORDER — RISPERIDONE 3 MG PO TABS
3.0000 mg | ORAL_TABLET | Freq: Two times a day (BID) | ORAL | Status: AC
Start: 2015-09-25 — End: ?

## 2015-09-25 MED ORDER — ALPRAZOLAM 0.5 MG PO TABS
0.5000 mg | ORAL_TABLET | Freq: Two times a day (BID) | ORAL | Status: DC | PRN
  Administered 2015-09-26: 10:00:00 0.5 mg via ORAL
  Filled 2015-09-25: qty 1

## 2015-09-25 MED ORDER — DOXYCYCLINE HYCLATE 100 MG PO CAPS: 100.0000 mg | ORAL_CAPSULE | Freq: Two times a day (BID) | ORAL | Status: DC

## 2015-09-25 MED ORDER — LORAZEPAM 2 MG/ML IJ SOLN
0.5000 mg | INTRAMUSCULAR | Status: DC | PRN
  Administered 2015-09-25 (×2): 0.5 mg via INTRAVENOUS
  Filled 2015-09-25 (×2): qty 1

## 2015-09-25 MED ORDER — TRAZODONE HCL 50 MG PO TABS
25.0000 mg | ORAL_TABLET | Freq: Every day | ORAL | Status: DC
  Administered 2015-09-25: 25 mg via ORAL
  Filled 2015-09-25: qty 1

## 2015-09-25 MED ORDER — AMOXICILLIN-POT CLAVULANATE 875-125 MG PO TABS
1.0000 | ORAL_TABLET | Freq: Two times a day (BID) | ORAL | Status: DC
  Administered 2015-09-25 – 2015-09-26 (×2): 1 via ORAL
  Filled 2015-09-25 (×2): qty 1

## 2015-09-25 MED ORDER — AMOXICILLIN-POT CLAVULANATE 500-125 MG PO TABS: 1.0000 | ORAL_TABLET | Freq: Three times a day (TID) | ORAL | Status: DC

## 2015-09-25 MED ORDER — RISPERIDONE 3 MG PO TABS
3.0000 mg | ORAL_TABLET | Freq: Two times a day (BID) | ORAL | Status: DC
  Administered 2015-09-25 – 2015-09-26 (×3): 3 mg via ORAL
  Filled 2015-09-25 (×4): qty 1

## 2015-09-25 MED ORDER — COSYNTROPIN 0.25 MG IJ SOLR
0.2500 mg | Freq: Once | INTRAMUSCULAR | Status: AC
  Administered 2015-09-26: 05:00:00 0.25 mg via INTRAVENOUS
  Filled 2015-09-25: qty 0.25

## 2015-09-25 MED ORDER — HALOPERIDOL LACTATE 5 MG/ML IJ SOLN
5.0000 mg | Freq: Once | INTRAMUSCULAR | Status: AC
  Administered 2015-09-25: 5 mg via INTRAMUSCULAR
  Filled 2015-09-25: qty 1

## 2015-09-25 MED ORDER — ALPRAZOLAM 0.25 MG PO TABS
0.2500 mg | ORAL_TABLET | Freq: Two times a day (BID) | ORAL | Status: DC | PRN
  Administered 2015-09-25: 10:00:00 0.25 mg via ORAL
  Filled 2015-09-25: qty 1

## 2015-09-25 MED ORDER — DOXYCYCLINE HYCLATE 100 MG PO TABS
100.0000 mg | ORAL_TABLET | Freq: Two times a day (BID) | ORAL | Status: DC
  Administered 2015-09-25 – 2015-09-26 (×3): 100 mg via ORAL
  Filled 2015-09-25 (×3): qty 1

## 2015-09-25 NOTE — Clinical Documentation Improvement (Signed)
Hospitalist  Was the diagnosis of Sepsis present on admission?    Sepsis - specify causative organism if known  Identify etiology of Sepsis - Device, Implant, Graft, Infusion, Abortion}  Other  Clinically Undetermined  Document any associated diagnoses/conditions.   Supporting Information: Sepsis documented per 5/31 progress notes. Lactic acid: 5/30: 1.6. 5/29: 1.0;  1.1.   Please exercise your independent, professional judgment when responding. A specific answer is not anticipated or expected.   Thank Sabino DonovanYou,  [place] [facility] [place] [phone]

## 2015-09-25 NOTE — Progress Notes (Signed)
Physical Therapy Treatment Patient Details Name: [name] MRN: [id number] DOB: February 02, 1936 Today's Date: 09/25/2015    History of Present Illness Pt admitted for complaints of AMS. Pt with history of DM, HTN, anemia, CHF, and GERD. Pt slightly confused upon evaluation. Warming blanket donned upon arrival to room. RN disconnected in order to allow pt mobility.    PT Comments    Pt lethargic, but responds to questioning; however, not always with relevant responses. Pt tends to start discernable conversation, but ultimately speech becomes more of a ramble that is incomprehensible. Pt tolerates some passive range of motion without any attempt to assist; with encouragement to assist, pt states "I can't do that". At one point pt agreeable to get up, but with attempt, pt refuses. No further attempt at this time. Continue PT as appropriate when pt better able to participate to progress motion, strength and endurance to improve functional mobility.   Follow Up Recommendations  SNF     Equipment Recommendations       Recommendations for Other Services       Precautions / Restrictions Precautions Precautions: Fall Restrictions Weight Bearing Restrictions: No    Mobility  Bed Mobility               General bed mobility comments: Initially agreeable to sit up; ultimately refuses any attempt   Transfers                    Ambulation/Gait                 Stairs            Wheelchair Mobility    Modified Rankin (Stroke Patients Only)       Balance                                    Cognition Arousal/Alertness: Lethargic Behavior During Therapy: Flat affect (confused) Overall Cognitive Status: History of cognitive impairments - at baseline                      Exercises General Exercises - Lower Extremity Ankle Circles/Pumps: PROM;Both;10 reps;Supine (with assist; pt says she can't do "that") Quad Sets: Other (comment)  (attempted; unable to comprehend) Short Arc Quad: PROM;Both;10 reps;Supine (pt states she "can't") Heel Slides: PROM;Both;10 reps (partial range) Hip ABduction/ADduction: PROM;Both;10 reps;Supine Other Exercises Other Exercises: pt mildly attempts to resist movement    General Comments        Pertinent Vitals/Pain Pain Assessment: No/denies pain (Pt confused; unsure pt understood; does no present in pain)    Home Living                      Prior Function            PT Goals (current goals can now be found in the care plan section) Progress towards PT goals: Not progressing toward goals - comment    Frequency  Min 2X/week    PT Plan Current plan remains appropriate    Co-evaluation             End of Session   Activity Tolerance: Other (comment);Patient limited by lethargy (limted secondary to confusion) Patient left: in bed;with call bell/phone within reach;with bed alarm set     Time: 1316-1331 PT Time Calculation (min) (ACUTE ONLY): 15 min  Charges:  $Therapeutic Exercise: 8-22 mins  G CodesKristeen Miss:      Heidi Elizabeth Bishop, PTA 09/25/2015, 2:12 PM

## 2015-09-25 NOTE — Progress Notes (Signed)
Inpatient Diabetes Program Recommendations  AACE/ADA: New Consensus Statement on Inpatient Glycemic Control (2015)  Target Ranges:  Prepandial:   less than 140 mg/dL      Peak postprandial:   less than 180 mg/dL (1-2 hours)      Critically ill patients:  140 - 180 mg/dL  Results for Ronald LoboCHEELY, Rifky (MRN [id number]) as of 09/25/2015 12:04  Ref. Range 09/23/2015 02:00 09/25/2015 05:56  Glucose Latest Ref Range: 65-99 mg/dL 409184 (H) 811258 (H)   Review of Glycemic Control  Diabetes history: DM2 (per H&P) Outpatient Diabetes medications: None Current orders for Inpatient glycemic control: None  Inpatient Diabetes Program Recommendations: Correction (SSI): Lab glucose 258 mg/dl today and patient has a documented history of diabetes. Noted patient was receiving Decadron until discontinued today which is likely cause of hyperglycemia.  While inpatient, please order CBGs with Novolog correction scale ACHS. A1C: Please consider ordering an A1C to evaluate glycemic control over the past 2-3 months.  Thanks, Orlando PennerMarie Krishika Bugge, RN, MSN, CDE Diabetes Coordinator Inpatient Diabetes Program [phone] (Team Pager from 8am to 5pm) [phone] ([facility] office) [phone] [facility] office) [phone] [facility] office)

## 2015-09-25 NOTE — Discharge Summary (Addendum)
[facility] [place] [facility]   PATIENT NAME: [name]    MR#:  [id number]  DATE OF BIRTH:  03-30-1936  DATE OF ADMISSION:  09/22/2015 ADMITTING PHYSICIAN: Enedina Finner, MD  DATE OF DISCHARGE: No discharge date for patient encounter.  PRIMARY CARE PHYSICIAN: Sherlene Shams, MD     ADMISSION DIAGNOSIS:  Altered mental status [R41.82] Cerebral infarction due to unspecified mechanism [I63.9]  DISCHARGE DIAGNOSIS:  Active Problems:   Encephalopathy, metabolic   Sepsis (HCC)   Pancytopenia (HCC)   Hypothermia   Hypokalemia   Pressure ulcer   CKD (chronic kidney disease), stage III   SECONDARY DIAGNOSIS:   Past Medical History  Diagnosis Date  . Diabetes mellitus   . GERD (gastroesophageal reflux disease)   . Hypertension   . Anemia   . Arthritis   . Hemorrhoids   . Hypertension   . CHF (congestive heart failure) (HCC)   . Renal disorder     .pro HOSPITAL COURSE:  Patient is 80 year old African-American female with past medical history significant for history of DVTs, hypertension, anemia, CHF, who presents to the hospital with altered mental status, sleepiness. On arrival to the hospital patient had CT scan of the head which was unremarkable for new findings, but atrophy. Patient's labs were concerning for Mild CO2 retention, hypokalemia, renal insufficiency, hyperglycemia, pancytopenia. Patient was noted to be hypothermic and was managed initially on the bear hugger. Some of her sedating medications were initially stopped, including the next and trazodone. She Improved with conservative therapy and was able to answersome questions, followed commands. Denied any complaints. Patient had LP done since her brain MRI revealed new layering material within the lateral ventricles, infection was of concern, however, patient's protein was only 25, glucose 141, 0 white blood cells, 5 red blood cells, CNS infection was felt to be unlikely. Patient  was seen by neurologist, electroencephalogram was performed, however, only slowing was noted, nonspecific. Patient was evaluated by infectious disease specialist, Dr. Sampson Goon, who recommended to continue patient on Augmentin 10 day therapy and doxycycline 7 day therapy. Patient became somewhat agitated, restless, 09/25/2015, her Xanax as well as trazodone were restarted, Risperdal was increased to twice daily dosing. Valproic acid due to pancytopenia was discontinued and is not recommended to be restarted. Discussion by problem: #1 Metabolic encephalopathy, likely due to sepsis, multiple metabolic derangements , appreciate neurologist, infectious disease specialist input, LP revealed no obvious infection, electroencephalogram  revealed generalized slowing, possible metabolic encephalopathy related. Patient's condition has improved significantly with therapy, microbiology  revealed no growth. Cortisol level was found to be low at 1.8, concerning for adrenal insufficiency, however, further workup has to be done to confirm it. Repeating cortisol level today. #2. Pancytopenia, appreciate oncologist input, was felt to be related to infectious process, iron studies, ferritin, folate levels were normal, neurologist felt that Depakote could have caused myelosuppression. Discontinued Depakote, patient's white blood cell count has improved, follow-up as outpatient. #3. Hypokalemia, supplemented intravenously, resolved #4. Renal insufficiency, chronic, stage III, stable from before, follow up as outpatient, patient's creatinine was 1.43, estimated GFR of 40 on 09/24/2015 #5. Hypothermia, felt to be due to  to sepsis, all cultures were negative so far, infectious disease specialist , Dr. Sampson Goon saw patient in consultation and recommended Augmentin for 10 days and doxycycline for 7 days, having in mind pancytopenia on admission, also as mentioned above cortisol level was low at 1.8, would need to rule out adrenal  insufficiency as a cause of  hypothermia, TSH was found to be normal at 2.04. Repeating cortisol level today. #6. Abnormal brain MRI with sediment in the lateral ventricles, no bacterial meningitis per neurologist due to clinical presentation, unremarkable LP and cultures, negative HIV status, patient's condition overall improved. #7. Dementia with agitation, advanced Risperdal to 2 times daily, valproic acid is discontinued, follow patient clinically and adjust medications as needed. Continue Xanax as previously scheduled, continue trazodone at nighttime. # 8 hypoadrenalism?, getting ACTH stimulation test tomorrow morning, endocrinology consult is requested.  DISCHARGE CONDITIONS:   Stable  CONSULTS OBTAINED:  Treatment Team:  Loann QuillLeslie F Herring, NP Kym GroomNeuro1 [facility], MD Thana FarrLeslie Reynolds, MD Mick Sellavid P Fitzgerald, MD  DRUG ALLERGIES:   Allergies  Allergen Reactions  . Hydrocodone-Acetaminophen Nausea And Vomiting and Other (See Comments)    Reaction:  Dizziness   . Morphine And Related Other (See Comments)    Reaction:  Hallucinations     DISCHARGE MEDICATIONS:   Current Discharge Medication List    START taking these medications   Details  amoxicillin-clavulanate (AUGMENTIN) 500-125 MG tablet Take 1 tablet (500 mg total) by mouth 3 (three) times daily. Qty: 20 tablet, Refills: 0    doxycycline (VIBRAMYCIN) 100 MG capsule Take 1 capsule (100 mg total) by mouth 2 (two) times daily. Qty: 14 capsule, Refills: 0      CONTINUE these medications which have CHANGED   Details  risperiDONE (RISPERDAL) 3 MG tablet Take 1 tablet (3 mg total) by mouth 2 (two) times daily. Qty: 60 tablet, Refills: 5      CONTINUE these medications which have NOT CHANGED   Details  ALPRAZolam (XANAX) 0.5 MG tablet Take 1 tablet (0.5 mg total) by mouth 2 (two) times daily as needed for anxiety. Qty: 60 tablet, Refills: 0    aspirin EC 81 MG tablet Take 81 mg by mouth daily.    carvedilol (COREG) 25 MG  tablet Take 25 mg by mouth daily with breakfast.    cloNIDine (CATAPRES) 0.2 MG tablet Take 0.2 mg by mouth 2 (two) times daily.    furosemide (LASIX) 40 MG tablet Take 40 mg by mouth 2 (two) times daily.    levothyroxine (SYNTHROID, LEVOTHROID) 50 MCG tablet Take 1 tablet (50 mcg total) by mouth daily before breakfast. Qty: 90 tablet, Refills: 1    LINZESS 145 MCG CAPS capsule Take 145 mcg by mouth daily before breakfast.     memantine (NAMENDA) 5 MG tablet Take 5 mg by mouth daily.     omeprazole (PRILOSEC) 40 MG capsule Take 1 capsule (40 mg total) by mouth daily. Qty: 30 capsule, Refills: 0    traZODone (DESYREL) 50 MG tablet Take 25 mg by mouth at bedtime.      STOP taking these medications     divalproex (DEPAKOTE) 250 MG DR tablet          DISCHARGE INSTRUCTIONS:    Patient is to follow-up with primary care physician as outpatient  If you experience worsening of your admission symptoms, develop shortness of breath, life threatening emergency, suicidal or homicidal thoughts you must seek medical attention immediately by calling 911 or calling your MD immediately  if symptoms less severe.  You Must read complete instructions/literature along with all the possible adverse reactions/side effects for all the Medicines you take and that have been prescribed to you. Take any new Medicines after you have completely understood and accept all the possible adverse reactions/side effects.   Please note  You were cared for by a hospitalist  during your hospital stay. If you have any questions about your discharge medications or the care you received while you were in the hospital after you are discharged, you can call the unit and asked to speak with the hospitalist on call if the hospitalist that took care of you is not available. Once you are discharged, your primary care physician will handle any further medical issues. Please note that NO REFILLS for any discharge medications will  be authorized once you are discharged, as it is imperative that you return to your primary care physician (or establish a relationship with a primary care physician if you do not have one) for your aftercare needs so that they can reassess your need for medications and monitor your lab values.    Today   CHIEF COMPLAINT:   Chief Complaint  Patient presents with  . Altered Mental Status    HISTORY OF PRESENT ILLNESS:  [name]  is a 80 y.o. female with a known history of DVTs, hypertension, anemia, CHF, who presents to the hospital with altered mental status, sleepiness. On arrival to the hospital patient had CT scan of the head which was unremarkable for new findings, but atrophy. Patient's labs were concerning for Mild CO2 retention, hypokalemia, renal insufficiency, hyperglycemia, pancytopenia. Patient was noted to be hypothermic and was managed initially on the bear hugger. Some of her sedating medications were initially stopped, including the next and trazodone. She Improved with conservative therapy and was able to answersome questions, followed commands. Denied any complaints. Patient had LP done since her brain MRI revealed new layering material within the lateral ventricles, infection was of concern, however, patient's protein was only 25, glucose 141, 0 white blood cells, 5 red blood cells, CNS infection was felt to be unlikely. Patient was seen by neurologist, electroencephalogram was performed, however, only slowing was noted, nonspecific. Patient was evaluated by infectious disease specialist, Dr. Sampson Goon, who recommended to continue patient on Augmentin 10 day therapy and doxycycline 7 day therapy. Patient became somewhat agitated, restless, 09/25/2015, her Xanax as well as trazodone were restarted, Risperdal was increased to twice daily dosing. Valproic acid due to pancytopenia was discontinued and is not recommended to be restarted. Discussion by problem: #1 Metabolic  encephalopathy, likely due to sepsis, multiple metabolic derangements , appreciate neurologist, infectious disease specialist input, LP revealed no obvious infection, electroencephalogram  revealed generalized slowing, possible metabolic encephalopathy related. Patient's condition has improved significantly with therapy, microbiology  revealed no growth. Cortisol level was found to be low at 1.8, concerning for adrenal insufficiency, however, further workup has to be done to confirm it. Repeating cortisol level today. #2. Pancytopenia, appreciate oncologist input, was felt to be related to infectious process, iron studies, ferritin, folate levels were normal, neurologist felt that Depakote could have caused myelosuppression. Discontinued Depakote, patient's white blood cell count has improved, follow-up as outpatient. #3. Hypokalemia, supplemented intravenously, resolved #4. Renal insufficiency, chronic, stage III, stable from before, follow up as outpatient, patient's creatinine was 1.43, estimated GFR of 40 on 09/24/2015 #5. Hypothermia, felt to be due to  to sepsis, all cultures were negative so far, infectious disease specialist , Dr. Sampson Goon saw patient in consultation and recommended Augmentin for 10 days and doxycycline for 7 days, having in mind pancytopenia on admission, also as mentioned above cortisol level was low at 1.8, would need to rule out adrenal insufficiency as a cause of hypothermia, TSH was found to be normal at 2.04. Repeating cortisol level today. #6. Abnormal brain MRI  with sediment in the lateral ventricles, no bacterial meningitis per neurologist due to clinical presentation, unremarkable LP and cultures, negative HIV status, patient's condition overall improved. #7. Dementia with agitation, advanced Risperdal to 2 times daily, valproic acid is discontinued, follow patient clinically and adjust medications as needed. Continue Xanax as previously scheduled, continue trazodone at  nighttime. # 8 hypoadrenalism?, getting ACTH stimulation test tomorrow morning, endocrinology consult is requested.     VITAL SIGNS:  Blood pressure 150/63, pulse 90, temperature 98 F (36.7 C), temperature source Oral, resp. rate 20, height 5\' 7"  (1.702 m), weight 81.789 kg (180 lb 5 oz), SpO2 100 %.  I/O:   Intake/Output Summary (Last 24 hours) at 09/25/15 1211 Last data filed at 09/24/15 1700  Gross per 24 hour  Intake    240 ml  Output      0 ml  Net    240 ml    PHYSICAL EXAMINATION:  GENERAL:  80 y.o.-year-old patient lying in the bed with no acute distress.  EYES: Pupils equal, round, reactive to light and accommodation. No scleral icterus. Extraocular muscles intact.  HEENT: Head atraumatic, normocephalic. Oropharynx and nasopharynx clear.  NECK:  Supple, no jugular venous distention. No thyroid enlargement, no tenderness.  LUNGS: Normal breath sounds bilaterally, no wheezing, rales,rhonchi or crepitation. No use of accessory muscles of respiration.  CARDIOVASCULAR: S1, S2 normal. No murmurs, rubs, or gallops.  ABDOMEN: Soft, non-tender, non-distended. Bowel sounds present. No organomegaly or mass.  EXTREMITIES: No pedal edema, cyanosis, or clubbing.  NEUROLOGIC: Cranial nerves II through XII are intact. Muscle strength 5/5 in all extremities. Sensation intact. Gait not checked.  PSYCHIATRIC: The patient is alert and oriented x 1.  SKIN: No obvious rash, lesion, or ulcer.   DATA REVIEW:   CBC  Recent Labs Lab 09/25/15 0556  WBC 6.6  HGB 10.5*  HCT 29.8*  PLT 150    Chemistries   Recent Labs Lab 09/23/15 0200  09/25/15 0556  NA 139  --  138  K 3.4*  < > 4.1  CL 104  --  102  CO2 32  --  28  GLUCOSE 184*  --  258*  BUN 18  --  24*  CREATININE 1.43*  < > 1.43*  CALCIUM 8.7*  --  9.7  AST 19  --   --   ALT 17  --   --   ALKPHOS 84  --   --   BILITOT 0.4  --   --   < > = values in this interval not displayed.  Cardiac Enzymes  Recent Labs Lab  09/23/15 0200  TROPONINI <0.03    Microbiology Results  Results for orders placed or performed during the hospital encounter of 09/22/15  Culture, blood (Routine X 2) w Reflex to ID Panel     Status: None (Preliminary result)   Collection Time: 09/22/15 10:13 PM  Result Value Ref Range Status   Specimen Description BLOOD RIGHT WRIST  Final   Special Requests BOTTLES DRAWN AEROBIC AND ANAEROBIC  Final   Culture NO GROWTH 2 DAYS  Final   Report Status PENDING  Incomplete  Culture, blood (Routine X 2) w Reflex to ID Panel     Status: None (Preliminary result)   Collection Time: 09/22/15 11:45 PM  Result Value Ref Range Status   Specimen Description BLOOD RIGHT WRIST  Final   Special Requests BOTTLES DRAWN AEROBIC AND ANAEROBIC  Final   Culture NO GROWTH 1 DAY  Final   Report Status PENDING  Incomplete  MRSA PCR Screening     Status: None   Collection Time: 09/23/15 12:10 AM  Result Value Ref Range Status   MRSA by PCR NEGATIVE NEGATIVE Final    Comment:        The GeneXpert MRSA Assay (FDA approved for NASAL specimens only), is one component of a comprehensive MRSA colonization surveillance program. It is not intended to diagnose MRSA infection nor to guide or monitor treatment for MRSA infections.   Culture, fungus without smear ([facility]-Only)     Status: None (Preliminary result)   Collection Time: 09/24/15  2:15 PM  Result Value Ref Range Status   Specimen Description CSF  Final   Special Requests NONE  Final   Culture NO GROWTH < 24 HOURS  Final   Report Status PENDING  Incomplete  CSF culture     Status: None (Preliminary result)   Collection Time: 09/24/15  2:17 PM  Result Value Ref Range Status   Specimen Description CSF  Final   Special Requests NONE  Final   Gram Stain   Final    FEW WBC PRESENT, PREDOMINANTLY MONONUCLEAR NO ORGANISMS SEEN    Culture NO GROWTH < 24 HOURS  Final   Report Status PENDING  Incomplete    RADIOLOGY:  Mr Brain Wo  Contrast  09/23/2015  CLINICAL DATA:  Altered mental status. EXAM: MRI HEAD WITHOUT CONTRAST TECHNIQUE: Multiplanar, multiecho pulse sequences of the brain and surrounding structures were obtained without intravenous contrast. COMPARISON:  CT head without contrast 09/23/2015. FINDINGS: There is layering debris within the occipital horns of the lateral ventricles bilaterally, worse on the left. This is new. There slight increase in ventricular size since the prior study. No acute infarct, hemorrhage, or other mass lesion is present. Moderate periventricular and subcortical white matter changes bilaterally are stable. No significant extra-axial fluid collection is present. The internal auditory canals are within normal limits bilaterally. T1 and T2 hyperintense material is present within the right Peters apex. There is minimal fluid in the right mastoid air cells. Internal auditory canals are normal. Flow is present in the major intracranial arteries. Bilateral lens replacements are present. There is diffuse opacification of maxillary sinuses bilaterally. Restricted diffusion suggests infection. Diffuse mucosal thickening is present through the remaining paranasal sinuses. Mucosal disease is more prominent right than left in the sphenoid sinuses. IMPRESSION: 1. New layering material within the lateral ventricles. This may related to infection. Neoplasm is considered less likely. This does not appear to be hemorrhage. Recommend CSF sampling for further evaluation. 2. Stable atrophy and white matter disease otherwise reflects the sequela of chronic microvascular ischemia. 3. No acute infarct. 4. A diffuse sinus disease with restricted diffusion the maxillary sinuses bilaterally compatible with acute sinusitis. These results will be called to the ordering clinician or representative by the Radiologist Assistant, and communication documented in the PACS or zVision Dashboard. Electronically Signed   By: Marin Roberts M.D.   On: 09/23/2015 14:04   Dg Fluoro Guided Loc Of Needle/cath Tip For Spinal Inject Lt  09/24/2015  CLINICAL DATA:  Meningitis. EXAM: DIAGNOSTIC LUMBAR PUNCTURE UNDER FLUOROSCOPIC GUIDANCE FLUOROSCOPY TIME:  Radiation Exposure Index (as provided by the fluoroscopic device): 21.9 mGy PROCEDURE: Informed consent was obtained from the patient prior to the procedure, including potential complications of headache, allergy, and pain. With the patient prone, the lower back was prepped with Betadine. 1% Lidocaine was used for local anesthesia. Lumbar puncture was performed at  the L4-L5 level using a 22 gauge needle with return of clear CSF with an opening pressure of 17 cm water. Thirteen ml of CSF were obtained for laboratory studies. The patient tolerated the procedure well and there were no apparent complications. IMPRESSION: Successful fluoroscopically directed lumbar puncture. Electronically Signed   By: Maisie Fus  Register   On: 09/24/2015 14:37    EKG:   Orders placed or performed during the hospital encounter of 09/22/15  . ED EKG  . ED EKG  . EKG 12-Lead  . EKG 12-Lead  . ED EKG  . ED EKG  . EKG 12-Lead  . EKG 12-Lead      Management plans discussed with the patient, family and they are in agreement.  CODE STATUS:     Code Status Orders        Start     Ordered   09/22/15 2127  Full code   Continuous     09/22/15 2126    Code Status History    Date Active Date Inactive Code Status Order ID Comments User Context   07/24/2015  2:18 AM 07/28/2015  6:12 PM Full Code [id number]  Ihor Austin, MD Inpatient   09/03/2014  1:01 PM 09/05/2014  6:09 PM Full Code [id number]  Enedina Finner, MD Inpatient    Advance Directive Documentation        Most Recent Value   Type of Advance Directive  Healthcare Power of Attorney   Pre-existing out of facility DNR order (yellow form or pink MOST form)     "MOST" Form in Place?        TOTAL TIME TAKING CARE OF THIS PATIENT: 40 minutes.     Katharina Caper M.D on 09/25/2015 at 12:11 PM  Between 7am to 6pm - Pager - [phone]  After 6pm go to [email or web address] - password [email or web address] [facility]  [place] [facility]  Office  [phone]  CC: Primary care physician; Sherlene Shams, MD

## 2015-09-25 NOTE — Clinical Documentation Improvement (Signed)
Hospitalist  Can the diagnosis of pressure ulcer be further specified?   Document if pressure ulcer with stage is Present on Admission   Document Site with laterality - Elbow, Back (upper/lower), Sacral, Hip, Buttock, Ankle, Heel, Head, Other (Specify)  Pressure Ulcer Stage - Stage1, Stage 2, Stage 3, Stage 4, Unstageable, Unspecified, Unable to Clinically Determine  Document any associated diagnoses/conditions such as: with gangrene  Other  Clinically Undetermined    Supporting Information: Pressure ulcer noted per 5/31 progress notes.   Please exercise your independent, professional judgment when responding. A specific answer is not anticipated or expected.   Thank Sabino DonovanYou,  Kamila Broda [facility] [place] [phone]

## 2015-09-25 NOTE — Progress Notes (Addendum)
Speech Language Pathology Treatment: Dysphagia  Patient Details Name: [name] MRN: [id number] DOB: Mar 09, 1936 Today's Date: 09/25/2015 Time: [id number] SLP Time Calculation (min) (ACUTE ONLY): 40 min  Assessment / Plan / Recommendation Clinical Impression  Pt appeared to adequately tolerate trials of Dys. 2 diet w/ thin liquids, however, required strategies of verbal cues to lingual sweep and swallow and alternate food and liquid boluses w/ general aspiration precautions d/t declined Cognitive status and reduced attention to task of oral intake. Pt was distracted by the mitts she was wearing post pulling out an IV and required cues to redirect to task. No overt s/s of aspiration were noted during the swallowing.  Pt does remain at increased risk(mild) and would benefit from a Dysphagia 2 diet w/ thin liquids w/ supervision during meals; aspiration precautions. Recommend skilled ST as f/u for toleration of diet and readiness for any diet consistency upgrade.    HPI HPI: Pt is a 80 year old female presenting after being found lethargic by family. Has had a similar episode in the past.Pt has multiple medical issues including GERD, DM, HTN, SHF, renal dis., and Vascular Dementia (per chart report/NSG).  Currently no focal findings on neurological examination. Bradykinesia likely related to medications.MRI shows some layering in the lateral ventricles. Due to MRI results there does remain some concern for infection. Clinically this does not appear to be the case but will investigate further since with evidence of myelosuppression and may not have a typical presentation. Pt is awake this morning eating her breakfast w/ assistance. Pt does seem less aware of oral clearing and requires cues for such by the NSG student; min. impulsive behavior is noted as well. Pt is easily distracted but attends w/ cues. Pt pulled out IV which NSG replaed; pt is now wearing mitts to prevent pulling of IV/lines.        SLP Plan  Continue with current plan of care     Recommendations  Diet recommendations: Dysphagia 2 (fine chop);Thin liquid Liquids provided via: Cup;Straw Medication Administration: Whole meds with puree Supervision: Staff to assist with self feeding;Full supervision/cueing for compensatory strategies Compensations: Minimize environmental distractions;Slow rate;Small sips/bites;Lingual sweep for clearance of pocketing;Follow solids with liquid;Multiple dry swallows after each bite/sip Postural Changes and/or Swallow Maneuvers: Seated upright 90 degrees;Upright 30-60 min after meal             General recommendations:  (Dietician) Oral Care Recommendations: Oral care BID;Staff/trained caregiver to provide oral care Follow up Recommendations: Skilled Nursing facility Plan: Continue with current plan of care     GO               [name] SomKatherine Shamica Moree, MS, CCC-SLP  [name] 09/25/2015, 4:34 PM

## 2015-09-25 NOTE — Progress Notes (Signed)
Pt has been very agitated this morning.  Pulled her IV out and trying to get out of bed.  Wanting to leave hospital.  Dr. Winona LegatoVaickute made aware and new orders received.  Pt's daughter Marella BileCoretta made aware as well.

## 2015-09-25 NOTE — Progress Notes (Signed)
Subjective: Patient awake and alert.  No new complaints.    Objective: Current vital signs: BP 150/63 mmHg  Pulse 90  Temp(Src) 98 F (36.7 C) (Oral)  Resp 20  Ht 5\' 7"  (1.702 m)  Wt 81.789 kg (180 lb 5 oz)  BMI 28.23 kg/m2  SpO2 100% Vital signs in last 24 hours: Temp:  [97.5 F (36.4 C)-98 F (36.7 C)] 98 F (36.7 C) (06/01 1013) Pulse Rate:  [69-90] 90 (06/01 1013) Resp:  [18-20] 20 (06/01 0424) BP: (143-176)/(36-63) 150/63 mmHg (06/01 1013) SpO2:  [99 %-100 %] 100 % (06/01 1013)  Intake/Output from previous day: 05/31 0701 - 06/01 0700 In: 240 [P.O.:240] Out: -  Intake/Output this shift:   Nutritional status: DIET DYS 2 Room service appropriate?: Yes with Assist; Fluid consistency:: Thin  Neurologic Exam: Mental Status: Alert, oriented, thought content appropriate. Speech fluent without evidence of aphasia. Able to follow 3 step commands without difficulty. Some bradykinesia noted.  Cranial Nerves: II: Discs flat bilaterally; Visual fields grossly normal, pupils equal, round, reactive to light and accommodation III,IV, VI: ptosis not present, extra-ocular motions intact bilaterally V,VII: smile symmetric, facial light touch sensation normal bilaterally VIII: hearing normal bilaterally IX,X: gag reflex present XI: bilateral shoulder shrug XII: midline tongue extension Motor: Able to lift all extremities against gravity with no focal weakness noted.  Sensory: Pinprick and light touch intact throughout, bilaterally Deep Tendon Reflexes: 2+ in the upper extremities and absent in the lower extremities.  Plantars: Right: downgoingLeft: downgoing  Lab Results: Basic Metabolic Panel:  Recent Labs Lab 09/22/15 1335 09/23/15 0200 09/24/15 0510 09/25/15 0556  NA 139 139  --  138  K 3.6 3.4* 4.0 4.1  CL 102 104  --  102  CO2 32 32  --  28  GLUCOSE 128* 184*  --  258*  BUN 20 18  --  24*  CREATININE 1.46* 1.43* 1.63*  1.43*  CALCIUM 9.2 8.7*  --  9.7    Liver Function Tests:  Recent Labs Lab 09/22/15 1335 09/23/15 0200  AST 17 19  ALT 19 17  ALKPHOS 105 84  BILITOT 0.5 0.4  PROT 6.5 5.5*  ALBUMIN 2.6* 2.2*   No results for input(s): LIPASE, AMYLASE in the last 168 hours.  Recent Labs Lab 09/22/15 1416  AMMONIA 18    CBC:  Recent Labs Lab 09/22/15 1335 09/23/15 0200 09/23/15 1506 09/24/15 0510 09/25/15 0556  WBC 2.9* 1.9*  --  3.5* 6.6  NEUTROABS  --   --  1.8  --   --   HGB 9.2* 8.1*  --  10.3* 10.5*  HCT 26.1* 23.1*  --  29.7* 29.8*  MCV 92.9 93.1  --  94.8 93.3  PLT 140* 122*  --  152 150    Cardiac Enzymes:  Recent Labs Lab 09/22/15 1335 09/23/15 0200  TROPONINI <0.03 <0.03    Lipid Panel: No results for input(s): CHOL, TRIG, HDL, CHOLHDL, VLDL, LDLCALC in the last 168 hours.  CBG:  Recent Labs Lab 09/23/15 0122  GLUCAP 166*    Microbiology: Results for orders placed or performed during the hospital encounter of 09/22/15  Culture, blood (Routine X 2) w Reflex to ID Panel     Status: None (Preliminary result)   Collection Time: 09/22/15 10:13 PM  Result Value Ref Range Status   Specimen Description BLOOD RIGHT WRIST  Final   Special Requests BOTTLES DRAWN AEROBIC AND ANAEROBIC 5ML  Final   Culture NO GROWTH 2 DAYS  Final   Report Status PENDING  Incomplete  Culture, blood (Routine X 2) w Reflex to ID Panel     Status: None (Preliminary result)   Collection Time: 09/22/15 11:45 PM  Result Value Ref Range Status   Specimen Description BLOOD RIGHT WRIST  Final   Special Requests BOTTLES DRAWN AEROBIC AND ANAEROBIC  Final   Culture NO GROWTH 1 DAY  Final   Report Status PENDING  Incomplete  MRSA PCR Screening     Status: None   Collection Time: 09/23/15 12:10 AM  Result Value Ref Range Status   MRSA by PCR NEGATIVE NEGATIVE Final    Comment:        The GeneXpert MRSA Assay (FDA approved for NASAL specimens only), is one component of  a comprehensive MRSA colonization surveillance program. It is not intended to diagnose MRSA infection nor to guide or monitor treatment for MRSA infections.   Culture, fungus without smear ([facility]-Only)     Status: None (Preliminary result)   Collection Time: 09/24/15  2:15 PM  Result Value Ref Range Status   Specimen Description CSF  Final   Special Requests NONE  Final   Culture NO GROWTH < 24 HOURS  Final   Report Status PENDING  Incomplete  CSF culture     Status: None (Preliminary result)   Collection Time: 09/24/15  2:17 PM  Result Value Ref Range Status   Specimen Description CSF  Final   Special Requests NONE  Final   Gram Stain   Final    FEW WBC PRESENT, PREDOMINANTLY MONONUCLEAR NO ORGANISMS SEEN    Culture NO GROWTH < 24 HOURS  Final   Report Status PENDING  Incomplete    Coagulation Studies:  Recent Labs  09/23/15 1506  LABPROT 14.2  INR 1.08    Imaging: Mr Brain Wo Contrast  09/23/2015  CLINICAL DATA:  Altered mental status. EXAM: MRI HEAD WITHOUT CONTRAST TECHNIQUE: Multiplanar, multiecho pulse sequences of the brain and surrounding structures were obtained without intravenous contrast. COMPARISON:  CT head without contrast 09/23/2015. FINDINGS: There is layering debris within the occipital horns of the lateral ventricles bilaterally, worse on the left. This is new. There slight increase in ventricular size since the prior study. No acute infarct, hemorrhage, or other mass lesion is present. Moderate periventricular and subcortical white matter changes bilaterally are stable. No significant extra-axial fluid collection is present. The internal auditory canals are within normal limits bilaterally. T1 and T2 hyperintense material is present within the right Peters apex. There is minimal fluid in the right mastoid air cells. Internal auditory canals are normal. Flow is present in the major intracranial arteries. Bilateral lens replacements are present. There is  diffuse opacification of maxillary sinuses bilaterally. Restricted diffusion suggests infection. Diffuse mucosal thickening is present through the remaining paranasal sinuses. Mucosal disease is more prominent right than left in the sphenoid sinuses. IMPRESSION: 1. New layering material within the lateral ventricles. This may related to infection. Neoplasm is considered less likely. This does not appear to be hemorrhage. Recommend CSF sampling for further evaluation. 2. Stable atrophy and white matter disease otherwise reflects the sequela of chronic microvascular ischemia. 3. No acute infarct. 4. A diffuse sinus disease with restricted diffusion the maxillary sinuses bilaterally compatible with acute sinusitis. These results will be called to the ordering clinician or representative by the Radiologist Assistant, and communication documented in the PACS or zVision Dashboard. Electronically Signed   By: Marin Roberts M.D.   On: 09/23/2015  14:04   Dg Fluoro Guided Loc Of Needle/cath Tip For Spinal Inject Lt  09/24/2015  CLINICAL DATA:  Meningitis. EXAM: DIAGNOSTIC LUMBAR PUNCTURE UNDER FLUOROSCOPIC GUIDANCE FLUOROSCOPY TIME:  Radiation Exposure Index (as provided by the fluoroscopic device): 21.9 mGy PROCEDURE: Informed consent was obtained from the patient prior to the procedure, including potential complications of headache, allergy, and pain. With the patient prone, the lower back was prepped with Betadine. 1% Lidocaine was used for local anesthesia. Lumbar puncture was performed at the L4-L5 level using a 22 gauge needle with return of clear CSF with an opening pressure of 17 cm water. Thirteen ml of CSF were obtained for laboratory studies. The patient tolerated the procedure well and there were no apparent complications. IMPRESSION: Successful fluoroscopically directed lumbar puncture. Electronically Signed   By: Maisie Fus  Register   On: 09/24/2015 14:37    Medications:  I have reviewed the patient's  current medications. Scheduled: . ampicillin-sulbactam (UNASYN) IV  3 g Intravenous Q6H  . aspirin EC  81 mg Oral Daily  . carvedilol  25 mg Oral BID WC  . cloNIDine  0.2 mg Oral BID  . dexamethasone  4 mg Intravenous Q8H  . doxycycline (VIBRAMYCIN) IV  100 mg Intravenous Q12H  . levothyroxine  50 mcg Oral Q0600  . memantine  5 mg Oral Daily  . pantoprazole  40 mg Oral Daily  . risperiDONE  3 mg Oral QHS  . traZODone  25 mg Oral QHS    Assessment/Plan: Patient with no new complaints s/p LP.  Protein 25, glucose 141, 0 wbcs, 5 rbcs. CNS infection unlikely.  Agree with discontinuation of Rocephin and Vanc.  Would also discontinue steroids as well.  Mental status improved from admission.  EEG only significant for slowing.     LOS: 3 days   Thana Farr, MD Neurology [phone] 09/25/2015  10:41 AM

## 2015-09-26 LAB — ACTH STIMULATION, 3 TIME POINTS
CORTISOL 60 MIN: 20.1 ug/dL
Cortisol, 30 Min: 17.4 ug/dL
Cortisol, Base: 1.3 ug/dL

## 2015-09-26 NOTE — Consult Note (Signed)
ENDOCRINOLOGY CONSULTATION  REFERRING PHYSICIAN: Katharina Caper, MD CONSULTING PHYSICIAN:  A. Wendall Mola, MD.  CHIEF COMPLAINT:  Hypothermia and low cortisol  History of Present Illness  [name] is a 80 y.o. female with medical history significant for hypothyroidism, type 2 diabetes, coronary artery disease, HTN, hyperlipidemia, and dementia - who is seen in consultation for hypothermia and low cortisol. Notes and records were reviewed. All history obtained from the chart.Patient was not alert enough to provide a history.  She presented to the ER from [facility] due to altered mental status. Evaluation has included CT head (which showed no acute findings), LP (no wbc, now rbc and nml protein), and various labs including AM cortisol found to be low at 1.8 ug/dl on 6/96 at 8 AM.  No known recent use of glucocorticoids. No long-term use of glucocorticoids. No hypotension or hyponatremia or hyperkalemia.   This morning an ACTH stim test showed her repeat cortisol low at 1.3 ug/dl 2:95 AM. After cosyntropin, a 30 min cortisol was 17.4 ug/dl and 60 min cortisol was 20.1 ug/dl.  Medical history Past Medical History  Diagnosis Date  . Diabetes mellitus   . GERD (gastroesophageal reflux disease)   . Hypertension   . Anemia   . Arthritis   . Hemorrhoids   . Hypertension   . CHF (congestive heart failure) (HCC)   . Renal disorder     Surgical history Past Surgical History  Procedure Laterality Date  . Cholecystectomy    . Abdominal hysterectomy    . Back surgery    . Arthroplasty w/ arthroscopy medial / lateral compartment knee  Feb 2011    [name]  . Hemorrhoid surgery  12/19/12    Family history Family History  Problem Relation Age of Onset  . Heart disease Mother   . Hypertension Mother   . Cancer Mother     Social History Social History  Substance Use Topics  . Smoking status: Former Smoker -- 30 years    Types: Cigarettes    Quit date: 09/28/2001  .  Smokeless tobacco: Current User    Types: Snuff  . Alcohol Use: No    Allergies Allergies  Allergen Reactions  . Hydrocodone-Acetaminophen Nausea And Vomiting and Other (See Comments)    Reaction:  Dizziness   . Morphine And Related Other (See Comments)    Reaction:  Hallucinations     Medications . amoxicillin-clavulanate  1 tablet Oral Q12H  . aspirin EC  81 mg Oral Daily  . carvedilol  25 mg Oral BID WC  . cloNIDine  0.2 mg Oral BID  . doxycycline  100 mg Oral Q12H  . levothyroxine  50 mcg Oral Q0600  . memantine  5 mg Oral Daily  . pantoprazole  40 mg Oral Daily  . risperiDONE  3 mg Oral BID  . traZODone  25 mg Oral QHS    Review of Systems Unable to obtain  Exam BP 171/46 mmHg  Pulse 76  Temp(Src) 98.5 F (36.9 C) (Oral)  Resp 22  Ht  (1.702 m)  Wt 81.789 kg (180 lb 5 oz)  BMI 28.23 kg/m2  SpO2 100%  GEN: Sleeping AAF. Difficult to arouse. Once awoke, she does not speak coherently. HEENT: No proptosis. Oropharynx is clear. EOMI. NECK: supple, trachea midline, thyroid not enlarged. No appreciable neck TTP. RESPIRATORY: clear bilaterally, no wheeze, good inspiratory effort. CV: No carotid bruits, RRR. ABD: soft, NT/ND. EXT: no peripheral edema SKIN: no dermatopathy or rash. LYMPH: no submandibular  or supraclavicular LAD NEURO: PERRL. PSYC: Arousable. Poor insight.   Labs Results for orders placed or performed during the hospital encounter of 09/22/15 (from the past 24 hour(s))  ACTH stimulation, 3 time points     Status: None   Collection Time: 09/26/15  4:50 AM  Result Value Ref Range   Cortisol, Base 1.3 ug/dL   Cortisol, 30 Min 16.117.4 ug/dL   Cortisol, 60 Min 09.620.1 ug/dL   Lab Results  Component Value Date   TSH 2.040 09/23/2015    Assessment Altered mental status Dementia History of hypothermia, now resolved Low morning cortisol Hypothyroidism  Plan Given results of ACTH stim test, she does not have primary adrenal insufficiency. No  causes to also suggest secondary adrenal insufficiency. While morning cortisol is low, this is a screening test for AI only and the ACTH stim test is a good confirmatory test to show she does not have AI.   Hypothyroidism is controlled. Continue low dose levothyroxine.  Will sign off. I am available if there are questions concerns at pager [phone].

## 2015-09-26 NOTE — BH Assessment (Signed)
Writer informed psychiatrist (Dr. Clapacs) of the consult. 

## 2015-09-26 NOTE — Progress Notes (Signed)
Alert with confusion, anxiety improved with alprazolam, pt is d/c to [facility], report given to crystal, pt will continue on po antibiotics at dsicharge. Pt is to f/u with endocrinology at discharge. Patient's daughter notified by Huntley DecSara, CSW.

## 2015-09-26 NOTE — Clinical Social Work Note (Signed)
Pt is ready for discharge today and will return to [facility]. CSW spoke with pt's daughter, who is aware and agreeable to discharge plan. Facility has received discharge information and is ready to accept pt. RN called report and EMS will provide transportation. CSW is signing off as no further needs identified.   Dede QuerySarah Addie Cederberg, MSW, LCSW  Clinical Social Worker  [phone]

## 2015-09-26 NOTE — Care Management Important Message (Signed)
Important Message  Patient Details  Name: Ronald LoboMertis Krohn MRN: [id number] Date of Birth: 1935/10/04   Medicare Important Message Given:  Yes    Gwenette GreetBrenda S Mavin Dyke, RN 09/26/2015, 9:59 AM

## 2015-09-26 NOTE — Progress Notes (Signed)
Spoke with [facility] [employer] rep at [phone], to notify of non-emergent EMS transport.  Auth notification reference given as [id number].   Service date range good from 09/26/15 - 12/25/15.   Gap exception requested to determine if services can be considered at an in-network level.

## 2015-09-26 NOTE — Progress Notes (Signed)
[facility] INFECTIOUS DISEASE PROGRESS NOTE Date of Admission:  09/22/2015     ID: Lether Geathers is a 80 y.o. female with AMS  Active Problems:   Encephalopathy, metabolic   Pressure ulcer   Pancytopenia (HCC)   Sepsis (HCC)   Hypothermia   Hypokalemia   CKD (chronic kidney disease), stage III   Subjective: No fevers, still with some confusion   ROS  Eleven systems are reviewed and negative except per hpi  Medications:  Antibiotics Given (last 72 hours)    Date/Time Action Medication Dose Rate   09/23/15 1617 Given   cefTRIAXone (ROCEPHIN) 2 g in dextrose 5 % 50 mL IVPB 2 g 100 mL/hr   09/23/15 2059 Given   cefTRIAXone (ROCEPHIN) 2 g in dextrose 5 % 50 mL IVPB 2 g 100 mL/hr   09/24/15 1148 Given   cefTRIAXone (ROCEPHIN) 2 g in dextrose 5 % 50 mL IVPB 2 g 100 mL/hr   09/24/15 1325 Given   vancomycin (VANCOCIN) IVPB 750 mg/150 ml premix 750 mg 150 mL/hr   09/24/15 2046 Given   cefTRIAXone (ROCEPHIN) 2 g in dextrose 5 % 50 mL IVPB 2 g 100 mL/hr   09/24/15 2226 Given   doxycycline (VIBRAMYCIN) 100 mg in dextrose 5 % 250 mL IVPB 100 mg 125 mL/hr   09/25/15 0029 Given   Ampicillin-Sulbactam (UNASYN) 3 g in sodium chloride 0.9 % 100 mL IVPB 3 g 100 mL/hr   09/25/15 0601 Given   Ampicillin-Sulbactam (UNASYN) 3 g in sodium chloride 0.9 % 100 mL IVPB 3 g 100 mL/hr   09/25/15 1015 Given   Ampicillin-Sulbactam (UNASYN) 3 g in sodium chloride 0.9 % 100 mL IVPB 3 g 100 mL/hr   09/25/15 1058 Given   doxycycline (VIBRAMYCIN) 100 mg in dextrose 5 % 250 mL IVPB 100 mg 125 mL/hr   09/25/15 1700 Given   doxycycline (VIBRA-TABS) tablet 100 mg 100 mg    09/25/15 2237 Given   doxycycline (VIBRA-TABS) tablet 100 mg 100 mg    09/25/15 2237 Given   amoxicillin-clavulanate (AUGMENTIN) 875-125 MG per tablet 1 tablet 1 tablet    09/26/15 0931 Given   doxycycline (VIBRA-TABS) tablet 100 mg 100 mg    09/26/15 0931 Given   amoxicillin-clavulanate (AUGMENTIN) 875-125 MG per tablet 1 tablet 1  tablet      . amoxicillin-clavulanate  1 tablet Oral Q12H  . aspirin EC  81 mg Oral Daily  . carvedilol  25 mg Oral BID WC  . cloNIDine  0.2 mg Oral BID  . doxycycline  100 mg Oral Q12H  . levothyroxine  50 mcg Oral Q0600  . memantine  5 mg Oral Daily  . pantoprazole  40 mg Oral Daily  . risperiDONE  3 mg Oral BID  . traZODone  25 mg Oral QHS    Objective: Vital signs in last 24 hours: Temp:  [98.4 F (36.9 C)-98.6 F (37 C)] 98.5 F (36.9 C) (06/02 0453) Pulse Rate:  [69-90] 76 (06/02 0758) Resp:  [20-22] 22 (06/02 0453) BP: (155-197)/(38-55) 171/46 mmHg (06/02 0758) SpO2:  [100 %] 100 % (06/02 0453) Constitutional: frail, confused HENT: Cassandra/AT, PERRLA, no scleral icterus Mouth/Throat: Oropharynx is clear and dry. No oropharyngeal exudate.  Cardiovascular: Normal rate, regular rhythm and normal heart sounds.  Pulmonary/Chest: Effort normal and breath sounds normal. No respiratory distress.  Neck = supple, no nuchal rigidity Abdominal: Soft. Bowel sounds are normal. exhibits no distension. There is no tenderness.  Lymphadenopathy: no cervical adenopathy. No axillary  adenopathy Neurological:cannot tell me where she is, when it is or why she is here Able to move all 4 ext. Psychiatric: confused  Lab Results  Recent Labs  09/24/15 0510 09/25/15 0556  WBC 3.5* 6.6  HGB 10.3* 10.5*  HCT 29.7* 29.8*  NA  --  138  K 4.0 4.1  CL  --  102  CO2  --  28  BUN  --  24*  CREATININE 1.63* 1.43*    Microbiology: Results for orders placed or performed during the hospital encounter of 09/22/15  Culture, blood (Routine X 2) w Reflex to ID Panel     Status: None (Preliminary result)   Collection Time: 09/22/15 10:13 PM  Result Value Ref Range Status   Specimen Description BLOOD RIGHT WRIST  Final   Special Requests BOTTLES DRAWN AEROBIC AND ANAEROBIC  Final   Culture NO GROWTH 4 DAYS  Final   Report Status PENDING  Incomplete  Culture, blood (Routine X 2) w Reflex  to ID Panel     Status: None (Preliminary result)   Collection Time: 09/22/15 11:45 PM  Result Value Ref Range Status   Specimen Description BLOOD RIGHT WRIST  Final   Special Requests BOTTLES DRAWN AEROBIC AND ANAEROBIC  Final   Culture NO GROWTH 3 DAYS  Final   Report Status PENDING  Incomplete  MRSA PCR Screening     Status: None   Collection Time: 09/23/15 12:10 AM  Result Value Ref Range Status   MRSA by PCR NEGATIVE NEGATIVE Final    Comment:        The GeneXpert MRSA Assay (FDA approved for NASAL specimens only), is one component of a comprehensive MRSA colonization surveillance program. It is not intended to diagnose MRSA infection nor to guide or monitor treatment for MRSA infections.   Culture, fungus without smear ([facility]-Only)     Status: None (Preliminary result)   Collection Time: 09/24/15  2:15 PM  Result Value Ref Range Status   Specimen Description CSF  Final   Special Requests NONE  Final   Culture NO FUNGUS ISOLATED AFTER 2 DAYS  Final   Report Status PENDING  Incomplete  CSF culture     Status: None (Preliminary result)   Collection Time: 09/24/15  2:17 PM  Result Value Ref Range Status   Specimen Description CSF  Final   Special Requests NONE  Final   Gram Stain   Final    FEW WBC PRESENT, PREDOMINANTLY MONONUCLEAR NO ORGANISMS SEEN    Culture NO GROWTH 2 DAYS  Final   Report Status PENDING  Incomplete    Studies/Results: No results found.  Assessment/Plan: Yazmeen Cygan is a 80 y.o. female with AMS, leukopenia, anemia and TCP as well as MRI showing sinus dz and layering material in the lateral ventricles. She was initally hypothermic but now is normothermic. BCX uCx neg.  Has had similar issues and admissions in past and some notes of hx of dementia and schizoaffective disorder Recommendations No evidence meningitis Agree with dc with augmentin for sinus disease And short course doxycyline Thank you very much for the consult. Will follow  with you.  Drakkar Medeiros P   09/26/2015, 2:37 PM

## 2015-09-26 NOTE — Discharge Summary (Signed)
[facility] [place] [facility]   PATIENT NAME: [name] LoboMertis Schickling    MR#:  [id number]  DATE OF BIRTH:  Jan 13, 1936  DATE OF ADMISSION:  09/22/2015 ADMITTING PHYSICIAN: Enedina FinnerSona Patel, MD  DATE OF DISCHARGE: No discharge date for patient encounter.  PRIMARY CARE PHYSICIAN: Sherlene ShamsULLO, TERESA L, MD     ADMISSION DIAGNOSIS:  Altered mental status [R41.82] Cerebral infarction due to unspecified mechanism [I63.9]  DISCHARGE DIAGNOSIS:  Active Problems:   Encephalopathy, metabolic   Sepsis (HCC)   Pancytopenia (HCC)   Hypothermia   Hypokalemia   Pressure ulcer   CKD (chronic kidney disease), stage III   SECONDARY DIAGNOSIS:   Past Medical History  Diagnosis Date  . Diabetes mellitus   . GERD (gastroesophageal reflux disease)   . Hypertension   . Anemia   . Arthritis   . Hemorrhoids   . Hypertension   . CHF (congestive heart failure) (HCC)   . Renal disorder     .pro HOSPITAL COURSE:  Patient is 80 year old African-American female with past medical history significant for history of DVTs, hypertension, anemia, CHF, who presents to the hospital with altered mental status, sleepiness. On arrival to the hospital patient had CT scan of the head which was unremarkable for new findings, but atrophy. Patient's labs were concerning for Mild CO2 retention, hypokalemia, renal insufficiency, hyperglycemia, pancytopenia. Patient was noted to be hypothermic and was managed initially on the bear hugger. Some of her sedating medications were initially stopped, including the next and trazodone. She Improved with conservative therapy and was able to answersome questions, followed commands. Denied any complaints. Patient had LP done since her brain MRI revealed new layering material within the lateral ventricles, infection was of concern, however, patient's protein was only 25, glucose 141, 0 white blood cells, 5 red blood cells, CNS infection was felt to be unlikely. Patient  was seen by neurologist, electroencephalogram was performed, however, only slowing was noted, nonspecific. Patient was evaluated by infectious disease specialist, Dr. Sampson GoonFitzgerald, who recommended to continue patient on Augmentin 10 day therapy and doxycycline 7 day therapy. Patient became somewhat agitated, restless, 09/25/2015, her Xanax as well as trazodone were restarted, Risperdal was increased to twice daily dosing. Valproic acid due to pancytopenia was discontinued and is not recommended to be restarted. Discussion by problem: #1 Metabolic encephalopathy, likely due to sepsis, multiple metabolic derangements , appreciate neurologist, infectious disease specialist input, LP revealed no obvious infection, electroencephalogram  revealed generalized slowing, possible metabolic encephalopathy related. Patient's condition has improved significantly with therapy, microbiology  revealed no growth. Cortisol level was found to be low at 1.8, concerning for adrenal insufficiency, however, further workup has to be done to confirm it. Repeating cortisol level today. #2. Pancytopenia, appreciate oncologist input, was felt to be related to infectious process, iron studies, ferritin, folate levels were normal, neurologist felt that Depakote could have caused myelosuppression. Discontinued Depakote, patient's white blood cell count has improved, follow-up as outpatient. #3. Hypokalemia, supplemented intravenously, resolved #4. Renal insufficiency, chronic, stage III, stable from before, follow up as outpatient, patient's creatinine was 1.43, estimated GFR of 40 on 09/24/2015 #5. Hypothermia, felt to be due to  to sepsis, all cultures were negative so far, infectious disease specialist , Dr. Sampson GoonFitzgerald saw patient in consultation and recommended Augmentin for 10 days and doxycycline for 7 days, having in mind pancytopenia on admission, also as mentioned above cortisol level was low at 1.8, would need to rule out adrenal  insufficiency as a cause of  hypothermia, TSH was found to be normal at 2.04. Repeating cortisol level today. #6. Abnormal brain MRI with sediment in the lateral ventricles, no bacterial meningitis per neurologist due to clinical presentation, unremarkable LP and cultures, negative HIV status, patient's condition overall improved. #7. Dementia with agitation, advanced Risperdal to 2 times daily, valproic acid is discontinued, follow patient clinically and adjust medications as needed. Continue Xanax as previously scheduled, continue trazodone at nighttime. # 8 hypoadrenalism, ACTH stimulation test is done on the day of discharge, endocrinology consult as outpatient is requested.Patient's blood pressure remains high. No hyperkalemia was reported  DISCHARGE CONDITIONS:   Stable  CONSULTS OBTAINED:  Treatment Team:  Loann Quill, NP Kym Groom, MD Thana Farr, MD Mick Sell, MD Raj Janus, MD Audery Amel, MD  DRUG ALLERGIES:   Allergies  Allergen Reactions  . Hydrocodone-Acetaminophen Nausea And Vomiting and Other (See Comments)    Reaction:  Dizziness   . Morphine And Related Other (See Comments)    Reaction:  Hallucinations     DISCHARGE MEDICATIONS:   Current Discharge Medication List    START taking these medications   Details  amoxicillin-clavulanate (AUGMENTIN) 500-125 MG tablet Take 1 tablet (500 mg total) by mouth 3 (three) times daily. Qty: 20 tablet, Refills: 0    doxycycline (VIBRAMYCIN) 100 MG capsule Take 1 capsule (100 mg total) by mouth 2 (two) times daily. Qty: 14 capsule, Refills: 0      CONTINUE these medications which have CHANGED   Details  risperiDONE (RISPERDAL) 3 MG tablet Take 1 tablet (3 mg total) by mouth 2 (two) times daily. Qty: 60 tablet, Refills: 5      CONTINUE these medications which have NOT CHANGED   Details  ALPRAZolam (XANAX) 0.5 MG tablet Take 1 tablet (0.5 mg total) by mouth 2 (two) times daily as needed for  anxiety. Qty: 60 tablet, Refills: 0    aspirin EC 81 MG tablet Take 81 mg by mouth daily.    carvedilol (COREG) 25 MG tablet Take 25 mg by mouth daily with breakfast.    cloNIDine (CATAPRES) 0.2 MG tablet Take 0.2 mg by mouth 2 (two) times daily.    furosemide (LASIX) 40 MG tablet Take 40 mg by mouth 2 (two) times daily.    levothyroxine (SYNTHROID, LEVOTHROID) 50 MCG tablet Take 1 tablet (50 mcg total) by mouth daily before breakfast. Qty: 90 tablet, Refills: 1    LINZESS 145 MCG CAPS capsule Take 145 mcg by mouth daily before breakfast.     memantine (NAMENDA) 5 MG tablet Take 5 mg by mouth daily.     omeprazole (PRILOSEC) 40 MG capsule Take 1 capsule (40 mg total) by mouth daily. Qty: 30 capsule, Refills: 0    traZODone (DESYREL) 50 MG tablet Take 25 mg by mouth at bedtime.      STOP taking these medications     divalproex (DEPAKOTE) 250 MG DR tablet          DISCHARGE INSTRUCTIONS:    Patient is to follow-up with primary care physician as outpatient  If you experience worsening of your admission symptoms, develop shortness of breath, life threatening emergency, suicidal or homicidal thoughts you must seek medical attention immediately by calling 911 or calling your MD immediately  if symptoms less severe.  You Must read complete instructions/literature along with all the possible adverse reactions/side effects for all the Medicines you take and that have been prescribed to you. Take any new Medicines after you have  completely understood and accept all the possible adverse reactions/side effects.   Please note  You were cared for by a hospitalist during your hospital stay. If you have any questions about your discharge medications or the care you received while you were in the hospital after you are discharged, you can call the unit and asked to speak with the hospitalist on call if the hospitalist that took care of you is not available. Once you are discharged, your  primary care physician will handle any further medical issues. Please note that NO REFILLS for any discharge medications will be authorized once you are discharged, as it is imperative that you return to your primary care physician (or establish a relationship with a primary care physician if you do not have one) for your aftercare needs so that they can reassess your need for medications and monitor your lab values.    Today   CHIEF COMPLAINT:   Chief Complaint  Patient presents with  . Altered Mental Status    HISTORY OF PRESENT ILLNESS:  Sharma Bordeaux  is a 80 y.o. female with a known history of DVTs, hypertension, anemia, CHF, who presents to the hospital with altered mental status, sleepiness. On arrival to the hospital patient had CT scan of the head which was unremarkable for new findings, but atrophy. Patient's labs were concerning for Mild CO2 retention, hypokalemia, renal insufficiency, hyperglycemia, pancytopenia. Patient was noted to be hypothermic and was managed initially on the bear hugger. Some of her sedating medications were initially stopped, including the next and trazodone. She Improved with conservative therapy and was able to answersome questions, followed commands. Denied any complaints. Patient had LP done since her brain MRI revealed new layering material within the lateral ventricles, infection was of concern, however, patient's protein was only 25, glucose 141, 0 white blood cells, 5 red blood cells, CNS infection was felt to be unlikely. Patient was seen by neurologist, electroencephalogram was performed, however, only slowing was noted, nonspecific. Patient was evaluated by infectious disease specialist, Dr. Sampson Goon, who recommended to continue patient on Augmentin 10 day therapy and doxycycline 7 day therapy. Patient became somewhat agitated, restless, 09/25/2015, her Xanax as well as trazodone were restarted, Risperdal was increased to twice daily dosing. Valproic  acid due to pancytopenia was discontinued and is not recommended to be restarted. Discussion by problem: #1 Metabolic encephalopathy, likely due to sepsis, multiple metabolic derangements , appreciate neurologist, infectious disease specialist input, LP revealed no obvious infection, electroencephalogram  revealed generalized slowing, possible metabolic encephalopathy related. Patient's condition has improved significantly with therapy, microbiology  revealed no growth. Cortisol level was found to be low at 1.8, concerning for adrenal insufficiency, however, further workup has to be done to confirm it. Repeating cortisol level today. #2. Pancytopenia, appreciate oncologist input, was felt to be related to infectious process, iron studies, ferritin, folate levels were normal, neurologist felt that Depakote could have caused myelosuppression. Discontinued Depakote, patient's white blood cell count has improved, follow-up as outpatient. #3. Hypokalemia, supplemented intravenously, resolved #4. Renal insufficiency, chronic, stage III, stable from before, follow up as outpatient, patient's creatinine was 1.43, estimated GFR of 40 on 09/24/2015 #5. Hypothermia, felt to be due to  to sepsis, all cultures were negative so far, infectious disease specialist , Dr. Sampson Goon saw patient in consultation and recommended Augmentin for 10 days and doxycycline for 7 days, having in mind pancytopenia on admission, also as mentioned above cortisol level was low at 1.8, would need to rule out adrenal  insufficiency as a cause of hypothermia, TSH was found to be normal at 2.04. Repeating cortisol level today. #6. Abnormal brain MRI with sediment in the lateral ventricles, no bacterial meningitis per neurologist due to clinical presentation, unremarkable LP and cultures, negative HIV status, patient's condition overall improved. #7. Dementia with agitation, advanced Risperdal to 2 times daily, valproic acid is discontinued,  follow patient clinically and adjust medications as needed. Continue Xanax as previously scheduled, continue trazodone at nighttime. # 8 hypoadrenalism, ACTH stimulation test was done on the day of discharge, endocrinology consult as outpatient is requested. Patient's blood pressure remains high. No hyperkalemia was reported.    VITAL SIGNS:  Blood pressure 171/46, pulse 76, temperature 98.5 F (36.9 C), temperature source Oral, resp. rate 22, height 5\' 7"  (1.702 m), weight 81.789 kg (180 lb 5 oz), SpO2 100 %.  I/O:    Intake/Output Summary (Last 24 hours) at 09/26/15 1007 Last data filed at 09/26/15 0705  Gross per 24 hour  Intake    120 ml  Output      0 ml  Net    120 ml    PHYSICAL EXAMINATION:  GENERAL:  80 y.o.-year-old patient lying in the bed with no acute distress.  EYES: Pupils equal, round, reactive to light and accommodation. No scleral icterus. Extraocular muscles intact.  HEENT: Head atraumatic, normocephalic. Oropharynx and nasopharynx clear.  NECK:  Supple, no jugular venous distention. No thyroid enlargement, no tenderness.  LUNGS: Normal breath sounds bilaterally, no wheezing, rales,rhonchi or crepitation. No use of accessory muscles of respiration.  CARDIOVASCULAR: S1, S2 normal. No murmurs, rubs, or gallops.  ABDOMEN: Soft, non-tender, non-distended. Bowel sounds present. No organomegaly or mass.  EXTREMITIES: No pedal edema, cyanosis, or clubbing.  NEUROLOGIC: Cranial nerves II through XII are intact. Muscle strength 5/5 in all extremities. Sensation intact. Gait not checked.  PSYCHIATRIC: The patient is alert and oriented x 1.  SKIN: No obvious rash, lesion, or ulcer.   DATA REVIEW:   CBC  Recent Labs Lab 09/25/15 0556  WBC 6.6  HGB 10.5*  HCT 29.8*  PLT 150    Chemistries   Recent Labs Lab 09/23/15 0200  09/25/15 0556  NA 139  --  138  K 3.4*  < > 4.1  CL 104  --  102  CO2 32  --  28  GLUCOSE 184*  --  258*  BUN 18  --  24*  CREATININE  1.43*  < > 1.43*  CALCIUM 8.7*  --  9.7  AST 19  --   --   ALT 17  --   --   ALKPHOS 84  --   --   BILITOT 0.4  --   --   < > = values in this interval not displayed.  Cardiac Enzymes  Recent Labs Lab 09/23/15 0200  TROPONINI <0.03    Microbiology Results  Results for orders placed or performed during the hospital encounter of 09/22/15  Culture, blood (Routine X 2) w Reflex to ID Panel     Status: None (Preliminary result)   Collection Time: 09/22/15 10:13 PM  Result Value Ref Range Status   Specimen Description BLOOD RIGHT WRIST  Final   Special Requests BOTTLES DRAWN AEROBIC AND ANAEROBIC  Final   Culture NO GROWTH 4 DAYS  Final   Report Status PENDING  Incomplete  Culture, blood (Routine X 2) w Reflex to ID Panel     Status: None (Preliminary result)   Collection Time: 09/22/15  11:45 PM  Result Value Ref Range Status   Specimen Description BLOOD RIGHT WRIST  Final   Special Requests BOTTLES DRAWN AEROBIC AND ANAEROBIC  Final   Culture NO GROWTH 3 DAYS  Final   Report Status PENDING  Incomplete  MRSA PCR Screening     Status: None   Collection Time: 09/23/15 12:10 AM  Result Value Ref Range Status   MRSA by PCR NEGATIVE NEGATIVE Final    Comment:        The GeneXpert MRSA Assay (FDA approved for NASAL specimens only), is one component of a comprehensive MRSA colonization surveillance program. It is not intended to diagnose MRSA infection nor to guide or monitor treatment for MRSA infections.   Culture, fungus without smear ([facility]-Only)     Status: None (Preliminary result)   Collection Time: 09/24/15  2:15 PM  Result Value Ref Range Status   Specimen Description CSF  Final   Special Requests NONE  Final   Culture NO FUNGUS ISOLATED AFTER 2 DAYS  Final   Report Status PENDING  Incomplete  CSF culture     Status: None (Preliminary result)   Collection Time: 09/24/15  2:17 PM  Result Value Ref Range Status   Specimen Description CSF  Final   Special  Requests NONE  Final   Gram Stain   Final    FEW WBC PRESENT, PREDOMINANTLY MONONUCLEAR NO ORGANISMS SEEN    Culture NO GROWTH 2 DAYS  Final   Report Status PENDING  Incomplete    RADIOLOGY:  Dg Fluoro Guided Loc Of Needle/cath Tip For Spinal Inject Lt  09/24/2015  CLINICAL DATA:  Meningitis. EXAM: DIAGNOSTIC LUMBAR PUNCTURE UNDER FLUOROSCOPIC GUIDANCE FLUOROSCOPY TIME:  Radiation Exposure Index (as provided by the fluoroscopic device): 21.9 mGy PROCEDURE: Informed consent was obtained from the patient prior to the procedure, including potential complications of headache, allergy, and pain. With the patient prone, the lower back was prepped with Betadine. 1% Lidocaine was used for local anesthesia. Lumbar puncture was performed at the L4-L5 level using a 22 gauge needle with return of clear CSF with an opening pressure of 17 cm water. Thirteen ml of CSF were obtained for laboratory studies. The patient tolerated the procedure well and there were no apparent complications. IMPRESSION: Successful fluoroscopically directed lumbar puncture. Electronically Signed   By: Maisie Fus  Register   On: 09/24/2015 14:37    EKG:   Orders placed or performed during the hospital encounter of 09/22/15  . ED EKG  . ED EKG  . EKG 12-Lead  . EKG 12-Lead  . ED EKG  . ED EKG  . EKG 12-Lead  . EKG 12-Lead      Management plans discussed with the patient, family and they are in agreement.  CODE STATUS:     Code Status Orders        Start     Ordered   09/22/15 2127  Full code   Continuous     09/22/15 2126    Code Status History    Date Active Date Inactive Code Status Order ID Comments User Context   07/24/2015  2:18 AM 07/28/2015  6:12 PM Full Code [id number]  Ihor Austin, MD Inpatient   09/03/2014  1:01 PM 09/05/2014  6:09 PM Full Code [id number]  Enedina Finner, MD Inpatient    Advance Directive Documentation        Most Recent Value   Type of Advance Directive  Healthcare Power of Attorney    Pre-existing out  of facility DNR order (yellow form or pink MOST form)     "MOST" Form in Place?        TOTAL TIME TAKING CARE OF THIS PATIENT: 40 minutes.    Katharina Caper M.D on 09/26/2015 at 10:07 AM  Between 7am to 6pm - Pager - [phone]  After 6pm go to [email or web address] - password [email or web address] [facility]  [place] [facility]  Office  [phone]  CC: Primary care physician; Sherlene Shams, MD

## 2015-09-27 LAB — CULTURE, BLOOD (ROUTINE X 2): CULTURE: NO GROWTH

## 2015-09-28 LAB — CULTURE, BLOOD (ROUTINE X 2): CULTURE: NO GROWTH

## 2015-09-29 LAB — CSF CULTURE W GRAM STAIN: Culture: NO GROWTH

## 2015-09-29 LAB — CSF CULTURE

## 2015-10-15 LAB — CULTURE, FUNGUS WITHOUT SMEAR

## 2016-05-12 ENCOUNTER — Encounter: Payer: Self-pay | Admitting: Emergency Medicine

## 2016-05-12 ENCOUNTER — Inpatient Hospital Stay
Admission: EM | Admit: 2016-05-12 | Discharge: 2016-05-17 | DRG: 871 | Disposition: A | Discharge status: Skilled Nursing Facility | Payer: Medicare Other | Attending: Internal Medicine | Admitting: Internal Medicine

## 2016-05-12 ENCOUNTER — Emergency Department: Payer: Medicare Other

## 2016-05-12 DIAGNOSIS — A4151 Sepsis due to Escherichia coli [E. coli]: Secondary | ICD-10-CM | POA: Diagnosis not present

## 2016-05-12 DIAGNOSIS — G9341 Metabolic encephalopathy: Secondary | ICD-10-CM | POA: Diagnosis present

## 2016-05-12 DIAGNOSIS — L8990 Pressure ulcer of unspecified site, unspecified stage: Secondary | ICD-10-CM | POA: Insufficient documentation

## 2016-05-12 DIAGNOSIS — E1122 Type 2 diabetes mellitus with diabetic chronic kidney disease: Secondary | ICD-10-CM | POA: Diagnosis present

## 2016-05-12 DIAGNOSIS — D649 Anemia, unspecified: Secondary | ICD-10-CM | POA: Diagnosis present

## 2016-05-12 DIAGNOSIS — Z96659 Presence of unspecified artificial knee joint: Secondary | ICD-10-CM | POA: Diagnosis present

## 2016-05-12 DIAGNOSIS — Z87891 Personal history of nicotine dependence: Secondary | ICD-10-CM | POA: Diagnosis not present

## 2016-05-12 DIAGNOSIS — Z87440 Personal history of urinary (tract) infections: Secondary | ICD-10-CM

## 2016-05-12 DIAGNOSIS — R6520 Severe sepsis without septic shock: Secondary | ICD-10-CM | POA: Diagnosis present

## 2016-05-12 DIAGNOSIS — Z8249 Family history of ischemic heart disease and other diseases of the circulatory system: Secondary | ICD-10-CM

## 2016-05-12 DIAGNOSIS — Z7982 Long term (current) use of aspirin: Secondary | ICD-10-CM

## 2016-05-12 DIAGNOSIS — G9340 Encephalopathy, unspecified: Secondary | ICD-10-CM | POA: Diagnosis not present

## 2016-05-12 DIAGNOSIS — N170 Acute kidney failure with tubular necrosis: Secondary | ICD-10-CM | POA: Diagnosis present

## 2016-05-12 DIAGNOSIS — N3000 Acute cystitis without hematuria: Secondary | ICD-10-CM | POA: Diagnosis present

## 2016-05-12 DIAGNOSIS — N183 Chronic kidney disease, stage 3 (moderate): Secondary | ICD-10-CM | POA: Diagnosis present

## 2016-05-12 DIAGNOSIS — Z809 Family history of malignant neoplasm, unspecified: Secondary | ICD-10-CM | POA: Diagnosis not present

## 2016-05-12 DIAGNOSIS — E860 Dehydration: Secondary | ICD-10-CM | POA: Diagnosis present

## 2016-05-12 DIAGNOSIS — N10 Acute tubulo-interstitial nephritis: Secondary | ICD-10-CM | POA: Diagnosis present

## 2016-05-12 DIAGNOSIS — I130 Hypertensive heart and chronic kidney disease with heart failure and stage 1 through stage 4 chronic kidney disease, or unspecified chronic kidney disease: Secondary | ICD-10-CM | POA: Diagnosis present

## 2016-05-12 DIAGNOSIS — I509 Heart failure, unspecified: Secondary | ICD-10-CM | POA: Diagnosis present

## 2016-05-12 DIAGNOSIS — K219 Gastro-esophageal reflux disease without esophagitis: Secondary | ICD-10-CM | POA: Diagnosis present

## 2016-05-12 DIAGNOSIS — N179 Acute kidney failure, unspecified: Secondary | ICD-10-CM

## 2016-05-12 DIAGNOSIS — M1990 Unspecified osteoarthritis, unspecified site: Secondary | ICD-10-CM | POA: Diagnosis present

## 2016-05-12 DIAGNOSIS — Z90710 Acquired absence of both cervix and uterus: Secondary | ICD-10-CM

## 2016-05-12 DIAGNOSIS — I959 Hypotension, unspecified: Secondary | ICD-10-CM | POA: Diagnosis present

## 2016-05-12 DIAGNOSIS — E039 Hypothyroidism, unspecified: Secondary | ICD-10-CM | POA: Diagnosis present

## 2016-05-12 DIAGNOSIS — F0390 Unspecified dementia without behavioral disturbance: Secondary | ICD-10-CM | POA: Diagnosis present

## 2016-05-12 DIAGNOSIS — A419 Sepsis, unspecified organism: Secondary | ICD-10-CM

## 2016-05-12 DIAGNOSIS — N390 Urinary tract infection, site not specified: Secondary | ICD-10-CM | POA: Diagnosis present

## 2016-05-12 DIAGNOSIS — Z885 Allergy status to narcotic agent status: Secondary | ICD-10-CM

## 2016-05-12 LAB — URINALYSIS, COMPLETE (UACMP) WITH MICROSCOPIC
BILIRUBIN URINE: NEGATIVE
GLUCOSE, UA: NEGATIVE mg/dL
KETONES UR: NEGATIVE mg/dL
Nitrite: NEGATIVE
PROTEIN: 100 mg/dL — AB
Specific Gravity, Urine: 1.012 (ref 1.005–1.030)
pH: 5 (ref 5.0–8.0)

## 2016-05-12 LAB — INFLUENZA PANEL BY PCR (TYPE A & B)
INFLBPCR: NEGATIVE
Influenza A By PCR: NEGATIVE

## 2016-05-12 LAB — CBC WITH DIFFERENTIAL/PLATELET
BASOS PCT: 0 %
Basophils Absolute: 0 10*3/uL (ref 0–0.1)
Eosinophils Absolute: 0 10*3/uL (ref 0–0.7)
Eosinophils Relative: 0 %
HEMATOCRIT: 25.4 % — AB (ref 35.0–47.0)
Hemoglobin: 8.8 g/dL — ABNORMAL LOW (ref 12.0–16.0)
LYMPHS ABS: 0.4 10*3/uL — AB (ref 1.0–3.6)
Lymphocytes Relative: 5 %
MCH: 32.4 pg (ref 26.0–34.0)
MCHC: 34.8 g/dL (ref 32.0–36.0)
MCV: 93.1 fL (ref 80.0–100.0)
MONO ABS: 1 10*3/uL — AB (ref 0.2–0.9)
MONOS PCT: 12 %
NEUTROS ABS: 6.6 10*3/uL — AB (ref 1.4–6.5)
Neutrophils Relative %: 83 %
Platelets: 174 10*3/uL (ref 150–440)
RBC: 2.73 MIL/uL — ABNORMAL LOW (ref 3.80–5.20)
RDW: 14.5 % (ref 11.5–14.5)
WBC: 8 10*3/uL (ref 3.6–11.0)

## 2016-05-12 LAB — COMPREHENSIVE METABOLIC PANEL
ALBUMIN: 2.5 g/dL — AB (ref 3.5–5.0)
ALT: 57 U/L — ABNORMAL HIGH (ref 14–54)
AST: 27 U/L (ref 15–41)
Alkaline Phosphatase: 137 U/L — ABNORMAL HIGH (ref 38–126)
Anion gap: 7 (ref 5–15)
BILIRUBIN TOTAL: 1.6 mg/dL — AB (ref 0.3–1.2)
BUN: 59 mg/dL — AB (ref 6–20)
CO2: 25 mmol/L (ref 22–32)
Calcium: 8.9 mg/dL (ref 8.9–10.3)
Chloride: 109 mmol/L (ref 101–111)
Creatinine, Ser: 4.25 mg/dL — ABNORMAL HIGH (ref 0.44–1.00)
GFR calc Af Amer: 11 mL/min — ABNORMAL LOW (ref >60)
GFR calc non Af Amer: 9 mL/min — ABNORMAL LOW (ref >60)
GLUCOSE: 164 mg/dL — AB (ref 65–99)
POTASSIUM: 4.5 mmol/L (ref 3.5–5.1)
Sodium: 141 mmol/L (ref 135–145)
TOTAL PROTEIN: 6.1 g/dL — AB (ref 6.5–8.1)

## 2016-05-12 LAB — PROTIME-INR
INR: 1.51
Prothrombin Time: 18.4 seconds — ABNORMAL HIGH (ref 11.4–15.2)

## 2016-05-12 LAB — LACTIC ACID, PLASMA: Lactic Acid, Venous: 1.3 mmol/L (ref 0.5–1.9)

## 2016-05-12 MED ORDER — ACETAMINOPHEN 650 MG RE SUPP: 650.0000 mg | Freq: Four times a day (QID) | RECTAL | Status: DC | PRN

## 2016-05-12 MED ORDER — ACETAMINOPHEN 325 MG PO TABS
650.0000 mg | ORAL_TABLET | Freq: Four times a day (QID) | ORAL | Status: DC | PRN
  Administered 2016-05-12 – 2016-05-16 (×3): 650 mg via ORAL
  Filled 2016-05-12 (×3): qty 2

## 2016-05-12 MED ORDER — LORAZEPAM 0.5 MG PO TABS: 0.5000 mg | ORAL_TABLET | Freq: Three times a day (TID) | ORAL | Status: DC | PRN

## 2016-05-12 MED ORDER — CLONIDINE HCL 0.1 MG PO TABS
0.3000 mg | ORAL_TABLET | Freq: Two times a day (BID) | ORAL | Status: DC
  Administered 2016-05-13 – 2016-05-17 (×8): 0.3 mg via ORAL
  Filled 2016-05-12 (×9): qty 3

## 2016-05-12 MED ORDER — LAMOTRIGINE 25 MG PO TABS
25.0000 mg | ORAL_TABLET | Freq: Every day | ORAL | Status: DC
  Administered 2016-05-12 – 2016-05-17 (×6): 25 mg via ORAL
  Filled 2016-05-12 (×6): qty 1

## 2016-05-12 MED ORDER — HEPARIN SODIUM (PORCINE) 5000 UNIT/ML IJ SOLN
5000.0000 [IU] | Freq: Three times a day (TID) | INTRAMUSCULAR | Status: DC
  Administered 2016-05-12 – 2016-05-17 (×15): 5000 [IU] via SUBCUTANEOUS
  Filled 2016-05-12 (×15): qty 1

## 2016-05-12 MED ORDER — CARVEDILOL 25 MG PO TABS
25.0000 mg | ORAL_TABLET | Freq: Every day | ORAL | Status: DC
  Administered 2016-05-12 – 2016-05-17 (×6): 25 mg via ORAL
  Filled 2016-05-12 (×7): qty 1

## 2016-05-12 MED ORDER — PANTOPRAZOLE SODIUM 40 MG PO TBEC
40.0000 mg | DELAYED_RELEASE_TABLET | Freq: Every day | ORAL | Status: DC
  Administered 2016-05-12 – 2016-05-16 (×5): 40 mg via ORAL
  Filled 2016-05-12 (×6): qty 1

## 2016-05-12 MED ORDER — SODIUM CHLORIDE 0.9 % IV SOLN
INTRAVENOUS | Status: DC
  Administered 2016-05-12 – 2016-05-14 (×3): via INTRAVENOUS

## 2016-05-12 MED ORDER — GABAPENTIN 100 MG PO CAPS
100.0000 mg | ORAL_CAPSULE | Freq: Every day | ORAL | Status: DC
  Administered 2016-05-12 – 2016-05-16 (×5): 100 mg via ORAL
  Filled 2016-05-12 (×5): qty 1

## 2016-05-12 MED ORDER — CEFTRIAXONE SODIUM-DEXTROSE 1-3.74 GM-% IV SOLR
1.0000 g | INTRAVENOUS | Status: DC
  Administered 2016-05-12: 1 g via INTRAVENOUS
  Filled 2016-05-12: qty 50

## 2016-05-12 MED ORDER — CEFEPIME-DEXTROSE 2 GM/50ML IV SOLR
2.0000 g | Freq: Once | INTRAVENOUS | Status: AC
  Administered 2016-05-12: 2 g via INTRAVENOUS
  Filled 2016-05-12: qty 50

## 2016-05-12 MED ORDER — SODIUM CHLORIDE 0.9 % IV BOLUS (SEPSIS)
1000.0000 mL | Freq: Once | INTRAVENOUS | Status: AC
  Administered 2016-05-12: 1000 mL via INTRAVENOUS

## 2016-05-12 MED ORDER — LISINOPRIL 5 MG PO TABS
5.0000 mg | ORAL_TABLET | Freq: Every day | ORAL | Status: DC
  Administered 2016-05-12: 5 mg via ORAL
  Filled 2016-05-12: qty 1

## 2016-05-12 MED ORDER — VANCOMYCIN HCL IN DEXTROSE 1-5 GM/200ML-% IV SOLN
1000.0000 mg | Freq: Once | INTRAVENOUS | Status: AC
  Administered 2016-05-12: 1000 mg via INTRAVENOUS
  Filled 2016-05-12: qty 200

## 2016-05-12 MED ORDER — ONDANSETRON HCL 4 MG/2ML IJ SOLN: 4.0000 mg | Freq: Four times a day (QID) | INTRAMUSCULAR | Status: DC | PRN

## 2016-05-12 MED ORDER — LEVOTHYROXINE SODIUM 50 MCG PO TABS
50.0000 ug | ORAL_TABLET | Freq: Every day | ORAL | Status: DC
  Administered 2016-05-12 – 2016-05-17 (×6): 50 ug via ORAL
  Filled 2016-05-12 (×6): qty 1

## 2016-05-12 MED ORDER — ONDANSETRON HCL 4 MG PO TABS: 4.0000 mg | ORAL_TABLET | Freq: Four times a day (QID) | ORAL | Status: DC | PRN

## 2016-05-12 MED ORDER — ASPIRIN EC 81 MG PO TBEC
81.0000 mg | DELAYED_RELEASE_TABLET | Freq: Every day | ORAL | Status: DC
  Administered 2016-05-12 – 2016-05-16 (×5): 81 mg via ORAL
  Filled 2016-05-12 (×6): qty 1

## 2016-05-12 MED ORDER — MEMANTINE HCL 5 MG PO TABS
5.0000 mg | ORAL_TABLET | Freq: Every day | ORAL | Status: DC
  Administered 2016-05-12 – 2016-05-17 (×6): 5 mg via ORAL
  Filled 2016-05-12 (×7): qty 1

## 2016-05-12 MED ORDER — RISPERIDONE 0.5 MG PO TABS
3.0000 mg | ORAL_TABLET | Freq: Every day | ORAL | Status: DC
  Administered 2016-05-12 – 2016-05-16 (×5): 3 mg via ORAL
  Filled 2016-05-12 (×3): qty 6

## 2016-05-12 MED ORDER — VITAMIN D 1000 UNITS PO TABS
1000.0000 [IU] | ORAL_TABLET | Freq: Every day | ORAL | Status: DC
  Administered 2016-05-12 – 2016-05-17 (×6): 1000 [IU] via ORAL
  Filled 2016-05-12 (×7): qty 1

## 2016-05-12 MED ORDER — SENNOSIDES-DOCUSATE SODIUM 8.6-50 MG PO TABS
1.0000 | ORAL_TABLET | Freq: Every day | ORAL | Status: DC
  Administered 2016-05-12 – 2016-05-17 (×6): 1 via ORAL
  Filled 2016-05-12 (×6): qty 1

## 2016-05-12 MED ORDER — DEXTROSE 5 % IV SOLN: 1.0000 g | INTRAVENOUS | Status: DC

## 2016-05-12 NOTE — ED Notes (Signed)
Patient transported to X-ray 

## 2016-05-12 NOTE — H&P (Signed)
[employer] [facility] [place] at [facility]   PATIENT NAME: [name]    MR#:  [id number]  DATE OF BIRTH:  Dec 17, 1935  DATE OF ADMISSION:  05/12/2016  PRIMARY CARE PHYSICIAN: Sherlene Shams, MD   REQUESTING/REFERRING PHYSICIAN: Dr. Willy Eddy  CHIEF COMPLAINT:   Chief Complaint  Patient presents with  . Code Sepsis    HISTORY OF PRESENT ILLNESS:  [name]  is a 81 y.o. female with a known history of Dementia, diabetes, hypertension, hyperlipidemia, GERD, history of CHF who presents to the hospital due to altered mental status and weakness. Patient resides at a skilled nursing facility and for the past few days has been more lethargic than usual. Her appetite has been poor. She was noted to have a fever today at the skilled nursing facility at therefore sent to the ER for further evaluation. As per the daughter who gives most of the history patient has been having foul-smelling urine now at the skull is an facility for the past few days. She presented to the ER was noted to have sepsis secondary to urinary tract infection also noted to be in acute kidney injury and hospitalist services were contacted further treatment and evaluation.  PAST MEDICAL HISTORY:   Past Medical History:  Diagnosis Date  . Anemia   . Arthritis   . CHF (congestive heart failure) (HCC)   . Diabetes mellitus   . GERD (gastroesophageal reflux disease)   . Hemorrhoids   . Hypertension   . Hypertension   . Renal disorder     PAST SURGICAL HISTORY:   Past Surgical History:  Procedure Laterality Date  . ABDOMINAL HYSTERECTOMY    . ARTHROPLASTY W/ ARTHROSCOPY MEDIAL / LATERAL COMPARTMENT KNEE  Feb 2011   [name]  . BACK SURGERY    . CHOLECYSTECTOMY    . HEMORRHOID SURGERY  12/19/12    SOCIAL HISTORY:   Social History  Substance Use Topics  . Smoking status: Former Smoker    Years: 30.00    Types: Cigarettes    Quit date: 09/28/2001  . Smokeless tobacco: Current User   Types: Snuff  . Alcohol use No    FAMILY HISTORY:   Family History  Problem Relation Age of Onset  . Heart disease Mother   . Hypertension Mother   . Cancer Mother     DRUG ALLERGIES:   Allergies  Allergen Reactions  . Hydrocodone-Acetaminophen Nausea And Vomiting and Other (See Comments)    Reaction:  Dizziness   . Morphine And Related Other (See Comments)    Reaction:  Hallucinations     REVIEW OF SYSTEMS:   Review of Systems  Constitutional: Positive for fever. Negative for weight loss.  HENT: Negative for congestion, nosebleeds and tinnitus.   Eyes: Negative for blurred vision, double vision and redness.  Respiratory: Negative for cough, hemoptysis and shortness of breath.   Cardiovascular: Negative for chest pain, orthopnea, leg swelling and PND.  Gastrointestinal: Negative for abdominal pain, diarrhea, melena, nausea and vomiting.  Genitourinary: Negative for dysuria, hematuria and urgency.  Musculoskeletal: Negative for falls and joint pain.  Neurological: Positive for weakness. Negative for dizziness, tingling, sensory change, focal weakness, seizures and headaches.  Endo/Heme/Allergies: Negative for polydipsia. Does not bruise/bleed easily.  Psychiatric/Behavioral: Negative for depression and memory loss. The patient is not nervous/anxious.     MEDICATIONS AT HOME:   Prior to Admission medications   Medication Sig Start Date End Date Taking? Authorizing Provider  ALPRAZolam Prudy Feeler) 0.5 MG tablet  Take 1 tablet (0.5 mg total) by mouth 2 (two) times daily as needed for anxiety. 07/28/15   Alford Highlandichard Wieting, MD  amoxicillin-clavulanate (AUGMENTIN) 500-125 MG tablet Take 1 tablet (500 mg total) by mouth 3 (three) times daily. 09/25/15   Katharina Caperima Vaickute, MD  aspirin EC 81 MG tablet Take 81 mg by mouth daily.    Historical Provider, MD  carvedilol (COREG) 25 MG tablet Take 25 mg by mouth daily with breakfast.    Historical Provider, MD  cloNIDine (CATAPRES) 0.2 MG tablet Take  0.2 mg by mouth 2 (two) times daily.    Historical Provider, MD  doxycycline (VIBRAMYCIN) 100 MG capsule Take 1 capsule (100 mg total) by mouth 2 (two) times daily. 09/25/15   Katharina Caperima Vaick[place], MD  furosemide (LASIX) 40 MG tablet Take 40 mg by mouth 2 (two) times daily.    Historical Provider, MD  levothyroxine (SYNTHROID, LEVOTHROID) 50 MCG tablet Take 1 tablet (50 mcg total) by mouth daily before breakfast. 06/23/15   Sherlene Shamseresa L Tullo, MD  LINZESS 145 MCG CAPS capsule Take 145 mcg by mouth daily before breakfast.     Historical Provider, MD  memantine (NAMENDA) 5 MG tablet Take 5 mg by mouth daily.     Historical Provider, MD  omeprazole (PRILOSEC) 40 MG capsule Take 1 capsule (40 mg total) by mouth daily. 05/15/15 05/14/16  Anne-Caroline Sharma CovertNorman, MD  risperiDONE (RISPERDAL) 3 MG tablet Take 1 tablet (3 mg total) by mouth 2 (two) times daily. 09/25/15   Katharina Caperima Vaickute, MD  traZODone (DESYREL) 50 MG tablet Take 25 mg by mouth at bedtime.    Historical Provider, MD      VITAL SIGNS:  Blood pressure (!) 122/40, pulse 87, resp. rate 17, height 5\' 4"  (1.626 m), weight 72 kg (158 lb 11.7 oz), SpO2 97 %.  PHYSICAL EXAMINATION:  Physical Exam  GENERAL:  81 y.o.-year-old patient lying in the bed in no acute distress.  EYES: Pupils equal, round, reactive to light and accommodation. No scleral icterus. Extraocular muscles intact.  HEENT: Head atraumatic, normocephalic. Oropharynx and nasopharynx clear. No oropharyngeal erythema, dry oral mucosa  NECK:  Supple, no jugular venous distention. No thyroid enlargement, no tenderness.  LUNGS: Normal breath sounds bilaterally, no wheezing, rales, rhonchi. No use of accessory muscles of respiration.  CARDIOVASCULAR: S1, S2 RRR. II/VI SEM at base , NO rubs, gallops, clicks.  ABDOMEN: Soft, nontender, nondistended. Bowel sounds present. No organomegaly or mass.  EXTREMITIES: Trace edema b/l, No cyanosis, or clubbing. + 2 pedal & radial pulses b/l.   NEUROLOGIC: Cranial  nerves II through XII are intact. No focal Motor or sensory deficits appreciated b/l. Globally weak PSYCHIATRIC: The patient is alert and oriented x 3. Good affect.  SKIN: No obvious rash, lesion, or ulcer.   LABORATORY PANEL:   CBC  Recent Labs Lab 05/12/16 1144  WBC 8.0  HGB 8.8*  HCT 25.4*  PLT 174   ------------------------------------------------------------------------------------------------------------------  Chemistries   Recent Labs Lab 05/12/16 1144  NA 141  K 4.5  CL 109  CO2 25  GLUCOSE 164*  BUN 59*  CREATININE 4.25*  CALCIUM 8.9  AST 27  ALT 57*  ALKPHOS 137*  BILITOT 1.6*   ------------------------------------------------------------------------------------------------------------------  Cardiac Enzymes No results for input(s): TROPONINI in the last 168 hours. ------------------------------------------------------------------------------------------------------------------  RADIOLOGY:  Dg Chest 2 View  Result Date: 05/12/2016 CLINICAL DATA:  Altered mental status.  Fever.  Low blood pressure. EXAM: CHEST  2 VIEW COMPARISON:  09/22/2015.  09/07/2014.  09/03/2014.  01/06/2014. FINDINGS: Mediastinum and hilar structures are normal. Mild right perihilar and right base infiltrate. No pleural effusion or pneumothorax. Heart size stable. No acute bony abnormality. IMPRESSION: Mild right infrahilar and right base infiltrate. Electronically Signed   By: Maisie Fus  Register   On: 05/12/2016 12:53     IMPRESSION AND PLAN:   81 year old female with past mental history of dementia, hypertension, hyperlipidemia, history of CHF, GERD, diabetes who presented to the hospital due to altered mental status, fever and noted to have sepsis secondary to urinary tract infection  1. Sepsis-patient meets criteria given fever, tachycardia and abnormal urinalysis. -We'll treat the patient with IV ceftriaxone for UTI and follow cultures. Follow hemodynamics.  2. Urinary tract  infection-patient has a previous history of Escherichia coli UTI. -In the ER patient given broad-spectrum IV antibiotics with vancomycin, cefepime. I will give the patient IV ceftriaxone, follow urine cultures.  3. Acute kidney injury-secondary to sepsis and dehydration.  -we'll hydrate with IV fluids, follow BUN and creatinine. Renal dose meds avoid nephrotoxins.  4. Chronic anemia-hemoglobin close to baseline. No acute indication for transfusion.  5. Dementia-continue Namenda, Risperdal.  6. Hypothyroidism-continue Synthroid.  7. Essential hypertension-continue carvedilol, clonidine.   All the records are reviewed and case discussed with ED provider. Management plans discussed with the patient, family and they are in agreement.  CODE STATUS: Full Code   TOTAL TIME TAKING CARE OF THIS PATIENT: 45 minutes.    Houston Siren M.D on 05/12/2016 at 1:46 PM  Between 7am to 6pm - Pager - [phone]  After 6pm go to [email or web address] - password [email or web address] [facility]  [facility]  Office  [phone]  CC: Primary care physician; Sherlene Shams, MD

## 2016-05-12 NOTE — ED Provider Notes (Signed)
[facility] Emergency Department Provider Note    First MD Initiated Contact with Patient 05/12/16 1137     (approximate)  I have reviewed the triage vital signs and the nursing notes.   HISTORY  Chief Complaint Code Sepsis    HPI [name] is a 81 y.o. female presents from nursing facility presents with altered mental status, decreased level of consciousness and hypotension. According to the family member at bedside the patient has had a deterioration in her mental status starting 3 days ago. Yesterday she was "lethargic" and difficult to arouse. They had to sternal rub her yesterday to get her to wake up. Today the symptoms did not improve and they measured a fever which point they called EMS to have her brought to the ER for further evaluation. Of note the patient was recently treated for UTI. Has had a productive cough. No abdominal pain. No nausea or vomiting. No diarrhea. EMS got an initial blood pressure of 80/30 with a temperature of 102.8. 1 L of normal saline was provided with improvement in her blood pressure. Patient arrives encephalopathic but is alert person and able to answer questions. Seems lethargic.   Past Medical History:  Diagnosis Date  . Anemia   . Arthritis   . CHF (congestive heart failure) (HCC)   . Diabetes mellitus   . GERD (gastroesophageal reflux disease)   . Hemorrhoids   . Hypertension   . Hypertension   . Renal disorder    Family History  Problem Relation Age of Onset  . Heart disease Mother   . Hypertension Mother   . Cancer Mother    Past Surgical History:  Procedure Laterality Date  . ABDOMINAL HYSTERECTOMY    . ARTHROPLASTY W/ ARTHROSCOPY MEDIAL / LATERAL COMPARTMENT KNEE  Feb 2011   [name]  . BACK SURGERY    . CHOLECYSTECTOMY    . HEMORRHOID SURGERY  12/19/12   Patient Active Problem List   Diagnosis Date Noted  . UTI (urinary tract infection) 05/12/2016  . Pancytopenia (HCC) 09/25/2015  . Sepsis  (HCC) 09/25/2015  . Hypothermia 09/25/2015  . Hypokalemia 09/25/2015  . CKD (chronic kidney disease), stage III 09/25/2015  . Pressure ulcer 09/24/2015  . Rhabdomyolysis 07/24/2015  . Encephalopathy, metabolic 07/18/2015  . Hospital discharge follow-up 06/21/2015  . Mixed Alzheimer's and vascular dementia 06/02/2015  . Schizoaffective disorder, bipolar type (HCC) 05/17/2015  . Psychosis 05/16/2015  . CKD (chronic kidney disease) stage 3, GFR 30-59 ml/min 05/03/2015  . Noncompliance with medication treatment due to overuse of medication 01/28/2015  . Hypothyroidism 01/28/2015  . Dysuria 08/07/2014  . Back pain of thoracolumbar region 09/23/2013  . Visit for preventive health examination 08/18/2013  . Obesity 07/07/2013  . Depression with anxiety 06/26/2013  . Hemorrhoids 11/06/2012  . Hidradenitis suppurativa 10/05/2012  . Venous insufficiency of leg 11/24/2011  . Type 2 diabetes, uncontrolled, with renal manifestation (HCC) 10/03/2011  . Anemia of chronic renal failure, stage 4 (severe) (HCC) 10/03/2011  . Chest pain with normal coronary angiography 04/21/2011  . Screening for colon cancer 04/12/2011  . Screening for breast cancer 04/12/2011  . Hypertension 04/12/2011  . Hyperlipidemia with target LDL less than 100 04/12/2011  . Screening for osteoporosis 04/12/2011      Prior to Admission medications   Medication Sig Start Date End Date Taking? Authorizing Provider  aspirin EC 81 MG tablet Take 81 mg by mouth daily.   Yes Historical Provider, MD  carvedilol (COREG) 25 MG tablet Take  25 mg by mouth daily with breakfast.   Yes Historical Provider, MD  cholecalciferol (VITAMIN D) 1000 units tablet Take 1,000 Units by mouth daily.   Yes Historical Provider, MD  cloNIDine (CATAPRES) 0.3 MG tablet Take 0.3 mg by mouth 2 (two) times daily.   Yes Historical Provider, MD  furosemide (LASIX) 40 MG tablet Take 80 mg by mouth 2 (two) times daily.    Yes Historical Provider, MD    gabapentin (NEURONTIN) 100 MG capsule Take 100 mg by mouth at bedtime.   Yes Historical Provider, MD  lamoTRIgine (LAMICTAL) 25 MG tablet Take 25 mg by mouth daily.   Yes Historical Provider, MD  levothyroxine (SYNTHROID, LEVOTHROID) 50 MCG tablet Take 1 tablet (50 mcg total) by mouth daily before breakfast. 06/23/15  Yes Sherlene Shams, MD  lisinopril (PRINIVIL,ZESTRIL) 5 MG tablet Take 5 mg by mouth daily.   Yes Historical Provider, MD  LORazepam (ATIVAN) 0.5 MG tablet Take 0.5 mg by mouth every 8 (eight) hours as needed for anxiety.   Yes Historical Provider, MD  memantine (NAMENDA) 5 MG tablet Take 5 mg by mouth daily.    Yes Historical Provider, MD  omeprazole (PRILOSEC) 40 MG capsule Take 1 capsule (40 mg total) by mouth daily. Patient taking differently: Take 20 mg by mouth daily.  05/15/15 05/14/16 Yes Anne-Caroline Sharma Covert, MD  polyvinyl alcohol (LIQUIFILM TEARS) 1.4 % ophthalmic solution Place 1 drop into both eyes 2 (two) times daily.   Yes Historical Provider, MD  potassium chloride (KLOR-CON) 20 MEQ packet Take by mouth 2 (two) times daily.   Yes Historical Provider, MD  risperiDONE (RISPERDAL) 3 MG tablet Take 1 tablet (3 mg total) by mouth 2 (two) times daily. Patient taking differently: Take 3 mg by mouth at bedtime.  09/25/15  Yes Katharina Caper, MD  sennosides-docusate sodium (SENOKOT-S) 8.6-50 MG tablet Take 1 tablet by mouth daily.   Yes Historical Provider, MD  ALPRAZolam Prudy Feeler) 0.5 MG tablet Take 1 tablet (0.5 mg total) by mouth 2 (two) times daily as needed for anxiety. Patient not taking: Reported on 05/12/2016 07/28/15   Alford Highland, MD    Allergies Hydrocodone-acetaminophen and Morphine and related    Social History Social History  Substance Use Topics  . Smoking status: Former Smoker    Years: 30.00    Types: Cigarettes    Quit date: 09/28/2001  . Smokeless tobacco: Current User    Types: Snuff  . Alcohol use No    Review of Systems Patient denies headaches,  rhinorrhea, blurry vision, numbness, shortness of breath, chest pain, edema, cough, abdominal pain, nausea, vomiting, diarrhea, dysuria, fevers, rashes or hallucinations unless otherwise stated above in HPI. ____________________________________________   PHYSICAL EXAM:  VITAL SIGNS: Vitals:   05/12/16 1629 05/12/16 2019  BP: (!) 135/52 (!) 129/42  Pulse: 98 94  Resp: 16 18  Temp: 99.7 F (37.6 C) (!) 102.6 F (39.2 C)    Constitutional: lethargic, critically ill appearing Eyes: Conjunctivae are normal. PERRL. EOMI. Head: Atraumatic. Nose: No congestion/rhinnorhea. Mouth/Throat: Mucous membranes are moist.  Oropharynx non-erythematous. Neck: No stridor. Painless ROM. No cervical spine tenderness to palpation Hematological/Lymphatic/Immunilogical: No cervical lymphadenopathy. Cardiovascular: normal rate, regular rhythm. Systolic ejection murmur  Diaphoretic and poorly perfused Respiratory: Normal respiratory effort.  No retractions. Lungs CTAB. Gastrointestinal: Soft and nontender. No distention. No abdominal bruits. No CVA tenderness. Musculoskeletal: No lower extremity tenderness , 2+ edema.  No joint effusions. Neurologic:  Encephalopathyic, drowsy, A&O to person and place.  MAE spontaneously  Skin:  Skin is warm, dry and intact. No rash noted. Psychiatric: Mood and affect are normal. Speech and behavior are normal.  ____________________________________________   LABS (all labs ordered are listed, but only abnormal results are displayed)  Results for orders placed or performed during the hospital encounter of 05/12/16 (from the past 24 hour(s))  Comprehensive metabolic panel     Status: Abnormal   Collection Time: 05/12/16 11:44 AM  Result Value Ref Range   Sodium 141 135 - 145 mmol/L   Potassium 4.5 3.5 - 5.1 mmol/L   Chloride 109 101 - 111 mmol/L   CO2 25 22 - 32 mmol/L   Glucose, Bld 164 (H) 65 - 99 mg/dL   BUN 59 (H) 6 - 20 mg/dL   Creatinine, Ser 1.61 (H) 0.44 -  1.00 mg/dL   Calcium 8.9 8.9 - 09.6 mg/dL   Total Protein 6.1 (L) 6.5 - 8.1 g/dL   Albumin 2.5 (L) 3.5 - 5.0 g/dL   AST 27 15 - 41 U/L   ALT 57 (H) 14 - 54 U/L   Alkaline Phosphatase 137 (H) 38 - 126 U/L   Total Bilirubin 1.6 (H) 0.3 - 1.2 mg/dL   GFR calc non Af Amer 9 (L) >60 mL/min   GFR calc Af Amer 11 (L) >60 mL/min   Anion gap 7 5 - 15  Lactic acid, plasma     Status: None   Collection Time: 05/12/16 11:44 AM  Result Value Ref Range   Lactic Acid, Venous 1.3 0.5 - 1.9 mmol/L  CBC with Differential     Status: Abnormal   Collection Time: 05/12/16 11:44 AM  Result Value Ref Range   WBC 8.0 3.6 - 11.0 K/uL   RBC 2.73 (L) 3.80 - 5.20 MIL/uL   Hemoglobin 8.8 (L) 12.0 - 16.0 g/dL   HCT 04.5 (L) 40.9 - 81.1 %   MCV 93.1 80.0 - 100.0 fL   MCH 32.4 26.0 - 34.0 pg   MCHC 34.8 32.0 - 36.0 g/dL   RDW 91.4 78.2 - 95.6 %   Platelets 174 150 - 440 K/uL   Neutrophils Relative % 83 %   Neutro Abs 6.6 (H) 1.4 - 6.5 K/uL   Lymphocytes Relative 5 %   Lymphs Abs 0.4 (L) 1.0 - 3.6 K/uL   Monocytes Relative 12 %   Monocytes Absolute 1.0 (H) 0.2 - 0.9 K/uL   Eosinophils Relative 0 %   Eosinophils Absolute 0.0 0 - 0.7 K/uL   Basophils Relative 0 %   Basophils Absolute 0.0 0 - 0.1 K/uL  Protime-INR     Status: Abnormal   Collection Time: 05/12/16 11:44 AM  Result Value Ref Range   Prothrombin Time 18.4 (H) 11.4 - 15.2 seconds   INR 1.51   Urinalysis, Complete w Microscopic     Status: Abnormal   Collection Time: 05/12/16 11:45 AM  Result Value Ref Range   Color, Urine AMBER (A) YELLOW   APPearance TURBID (A) CLEAR   Specific Gravity, Urine 1.012 1.005 - 1.030   pH 5.0 5.0 - 8.0   Glucose, UA NEGATIVE NEGATIVE mg/dL   Hgb urine dipstick MODERATE (A) NEGATIVE   Bilirubin Urine NEGATIVE NEGATIVE   Ketones, ur NEGATIVE NEGATIVE mg/dL   Protein, ur 213 (A) NEGATIVE mg/dL   Nitrite NEGATIVE NEGATIVE   Leukocytes, UA LARGE (A) NEGATIVE   RBC / HPF 6-30 0 - 5 RBC/hpf   WBC, UA TOO  NUMEROUS TO COUNT 0 - 5 WBC/hpf  Bacteria, UA MANY (A) NONE SEEN   Squamous Epithelial / LPF 0-5 (A) NONE SEEN   WBC Clumps PRESENT    Hyaline Casts, UA PRESENT   Influenza panel by PCR (type A & B)     Status: None   Collection Time: 05/12/16 12:03 PM  Result Value Ref Range   Influenza A By PCR NEGATIVE NEGATIVE   Influenza B By PCR NEGATIVE NEGATIVE   ____________________________________________  EKG My review and personal interpretation at Time: 11:41   Indication: sepsis  Rate: 85  Rhythm: sinus Axis: normal Other: non specific st changes, poor r wave progression ____________________________________________  RADIOLOGY  I personally reviewed all radiographic images ordered to evaluate for the above acute complaints and reviewed radiology reports and findings.  These findings were personally discussed with the patient.  Please see medical record for radiology report.  ____________________________________________   PROCEDURES  Procedure(s) performed:  Procedures    Critical Care performed: no ____________________________________________   INITIAL IMPRESSION / ASSESSMENT AND PLAN / ED COURSE  Pertinent labs & imaging results that were available during my care of the patient were reviewed by me and considered in my medical decision making (see chart for details).  DDX: sepsis, uti, pna, flu, bacteremia, acidosis  [name] is a 81 y.o. who presents to the ED with altered mental status and concern for sepsis as described above. Based on recent treatment of UTI I am concerned for urosepsis. Patient without signs or symptoms of encephalitis or min meningitis. I do suspect metabolic encephalopathy. Patient given IV fluids for resuscitation. We'll start empiric broad-spectrum antibiotics.  The patient will be placed on continuous pulse oximetry and telemetry for monitoring.  Laboratory evaluation will be sent to evaluate for the above complaints.     Clinical Course as  of May 13 2019  Wed May 12, 2016  1300 PAtient with evidence of urosepsis.  Clinically improving with IVF.  Abx infusing. Also with evidence of marked AKI.  No history of stones.   [PR]  1301 CXR also with evidence of infiltrate but patient is not hypoxic at this time.  I spoke with Dr. Quentin Cornwall of hospitalist group who kindly agrees to admit patient for further evaluation and management.  Have discussed with the patient and available family all diagnostics and treatments performed thus far and all questions were answered to the best of my ability. The patient demonstrates understanding and agreement with plan.    [PR]    Clinical Course User Index [PR] Willy Eddy, MD     ____________________________________________   FINAL CLINICAL IMPRESSION(S) / ED DIAGNOSES  Final diagnoses:  Sepsis, due to unspecified organism [facility])  Acute encephalopathy  Acute pyelonephritis  AKI (acute kidney injury) (HCC)      NEW MEDICATIONS STARTED DURING THIS VISIT:  Current Discharge Medication List       Note:  This document was prepared using Dragon voice recognition software and may include unintentional dictation errors.    Willy Eddy, MD 05/12/16 2024

## 2016-05-12 NOTE — Progress Notes (Signed)
   [employer] - [place] at [facility]   Advance care planning  Hospital Day: 0 days [name] is a 81 y.o. female presenting with Code Sepsis .   Advance care planning discussed with patient  with additional Family at bedside. All questions in regards to overall condition and expected prognosis answered. The decision was made to continue current code status  CODE STATUS: full Time spent: 18 minutes

## 2016-05-12 NOTE — ED Triage Notes (Signed)
[facility] in from [facility] with altered mental status, decreased LOC, hypotension.  Pt alert to self only.  EMS reports initial BP 80/30 and temperature 102.8; 1,07100mL NS given.  Pt responsive to verbal stimuli, alert to self only.  MD notified.

## 2016-05-13 LAB — BLOOD CULTURE ID PANEL (REFLEXED)
ACINETOBACTER BAUMANNII: NOT DETECTED
CANDIDA ALBICANS: NOT DETECTED
CANDIDA TROPICALIS: NOT DETECTED
Candida glabrata: NOT DETECTED
Candida krusei: NOT DETECTED
Candida parapsilosis: NOT DETECTED
Carbapenem resistance: NOT DETECTED
ENTEROBACTER CLOACAE COMPLEX: NOT DETECTED
ENTEROCOCCUS SPECIES: NOT DETECTED
Enterobacteriaceae species: DETECTED — AB
Escherichia coli: DETECTED — AB
HAEMOPHILUS INFLUENZAE: NOT DETECTED
Klebsiella oxytoca: NOT DETECTED
Klebsiella pneumoniae: NOT DETECTED
LISTERIA MONOCYTOGENES: NOT DETECTED
NEISSERIA MENINGITIDIS: NOT DETECTED
PROTEUS SPECIES: NOT DETECTED
Pseudomonas aeruginosa: NOT DETECTED
STREPTOCOCCUS AGALACTIAE: NOT DETECTED
STREPTOCOCCUS SPECIES: NOT DETECTED
Serratia marcescens: NOT DETECTED
Staphylococcus aureus (BCID): NOT DETECTED
Staphylococcus species: NOT DETECTED
Streptococcus pneumoniae: NOT DETECTED
Streptococcus pyogenes: NOT DETECTED

## 2016-05-13 LAB — CBC
HCT: 24.2 % — ABNORMAL LOW (ref 35.0–47.0)
Hemoglobin: 8.5 g/dL — ABNORMAL LOW (ref 12.0–16.0)
MCH: 32.4 pg (ref 26.0–34.0)
MCHC: 35 g/dL (ref 32.0–36.0)
MCV: 92.4 fL (ref 80.0–100.0)
PLATELETS: 156 10*3/uL (ref 150–440)
RBC: 2.62 MIL/uL — ABNORMAL LOW (ref 3.80–5.20)
RDW: 14.9 % — AB (ref 11.5–14.5)
WBC: 6.7 10*3/uL (ref 3.6–11.0)

## 2016-05-13 LAB — BASIC METABOLIC PANEL
Anion gap: 8 (ref 5–15)
BUN: 59 mg/dL — AB (ref 6–20)
CALCIUM: 8.6 mg/dL — AB (ref 8.9–10.3)
CO2: 22 mmol/L (ref 22–32)
CREATININE: 3.54 mg/dL — AB (ref 0.44–1.00)
Chloride: 112 mmol/L — ABNORMAL HIGH (ref 101–111)
GFR, EST AFRICAN AMERICAN: 13 mL/min — AB (ref >60)
GFR, EST NON AFRICAN AMERICAN: 11 mL/min — AB (ref >60)
Glucose, Bld: 160 mg/dL — ABNORMAL HIGH (ref 65–99)
Potassium: 3.9 mmol/L (ref 3.5–5.1)
SODIUM: 142 mmol/L (ref 135–145)

## 2016-05-13 LAB — MRSA PCR SCREENING: MRSA BY PCR: NEGATIVE

## 2016-05-13 MED ORDER — MEROPENEM-SODIUM CHLORIDE 500 MG/50ML IV SOLR
500.0000 mg | Freq: Two times a day (BID) | INTRAVENOUS | Status: DC
  Administered 2016-05-13 – 2016-05-15 (×5): 500 mg via INTRAVENOUS
  Filled 2016-05-13 (×6): qty 50

## 2016-05-13 NOTE — Progress Notes (Signed)
[facility] - [place] at [facility]   PATIENT NAME: [name]    MR#:  [id number]  DATE OF BIRTH:  [birth year]  SUBJECTIVE:  CHIEF COMPLAINT:   Chief Complaint  Patient presents with  . Code Sepsis   - Patient admitted with sepsis from UTI also with acute renal failure. -Has underlying dementia, pleasantly confused at baseline. Still appears to be lethargic.  REVIEW OF SYSTEMS:  Review of Systems  Unable to perform ROS: Dementia    DRUG ALLERGIES:   Allergies  Allergen Reactions  . Hydrocodone-Acetaminophen Nausea And Vomiting and Other (See Comments)    Reaction:  Dizziness   . Morphine And Related Other (See Comments)    Reaction:  Hallucinations     VITALS:  Blood pressure (!) 116/32, pulse 80, temperature 99.7 F (37.6 C), temperature source Oral, resp. rate 15, height 5\' 4"  (1.626 m), weight 72 kg (158 lb 11.7 oz), SpO2 99 %.  PHYSICAL EXAMINATION:  Physical Exam  GENERAL:  81 y.o.-year-old patient lying in the bed with no acute distress. obtunded EYES: Pupils equal, round, reactive to light and accommodation. No scleral icterus. Extraocular muscles intact.  HEENT: Head atraumatic, normocephalic. Oropharynx and nasopharynx clear.  NECK:  Supple, no jugular venous distention. No thyroid enlargement, no tenderness.  LUNGS: Normal breath sounds bilaterally, no wheezing, rales,rhonchi or crepitation. No use of accessory muscles of respiration. Decreased bibasilar breath sounds CARDIOVASCULAR: S1, S2 normal. No rubs, or gallops. 2/6 systolic murmur present. ABDOMEN: Soft, nontender, nondistended. Bowel sounds present. No organomegaly or mass.  EXTREMITIES: No pedal edema, cyanosis, or clubbing.  NEUROLOGIC: non arousable, moaning to deep sternal rub. Not following commands PSYCHIATRIC: The patient is drowsy SKIN: No obvious rash, lesion, or ulcer.    LABORATORY PANEL:   CBC  Recent Labs Lab 05/13/16 0535  WBC 6.7  HGB 8.5*  HCT 24.2*   PLT 156   ------------------------------------------------------------------------------------------------------------------  Chemistries   Recent Labs Lab 05/12/16 1144 05/13/16 0535  NA 141 142  K 4.5 3.9  CL 109 112*  CO2 25 22  GLUCOSE 164* 160*  BUN 59* 59*  CREATININE 4.25* 3.54*  CALCIUM 8.9 8.6*  AST 27  --   ALT 57*  --   ALKPHOS 137*  --   BILITOT 1.6*  --    ------------------------------------------------------------------------------------------------------------------  Cardiac Enzymes No results for input(s): TROPONINI in the last 168 hours. ------------------------------------------------------------------------------------------------------------------  RADIOLOGY:  Dg Chest 2 View  Result Date: 05/12/2016 CLINICAL DATA:  Altered mental status.  Fever.  Low blood pressure. EXAM: CHEST  2 VIEW COMPARISON:  09/22/2015.  09/07/2014.  09/03/2014.  01/06/2014. FINDINGS: Mediastinum and hilar structures are normal. Mild right perihilar and right base infiltrate. No pleural effusion or pneumothorax. Heart size stable. No acute bony abnormality. IMPRESSION: Mild right infrahilar and right base infiltrate. Electronically Signed   By: Maisie Fus  Register   On: 05/12/2016 12:53    EKG:   Orders placed or performed during the hospital encounter of 05/12/16  . EKG 12-Lead  . EKG 12-Lead    ASSESSMENT AND PLAN:   81 year old female with past mental history of dementia, hypertension, hyperlipidemia, history of CHF, GERD, diabetes who presented to the hospital due to altered mental status, fever and noted to have sepsis secondary to urinary tract infection  1. Sepsis- secondary to acute cystitis - blood and urine cultures are pending - BCID positive for E.coli and enterobacteriae- ABX changed to meropenem.  2. ARF- on CKD< baseline cr of 1.4 Cr  was 4.25 on admission- ATN from sepsis - discontinue nephrotoxins - hold lisinopril, IV fluids, slowly improving  3.  Chronic anemia-hemoglobin close to baseline. No acute indication for transfusion.  4. Dementia-continue Namenda, Risperdal. Pleasantly confused at baseline  5. Metabolic encephalopathy- from sepsis, monitor  6. Hypothyroidism-continue Synthroid.  7. Essential hypertension-continue carvedilol, clonidine.   Physical Therapy Plan to return to [facility] as patient is a long term resident there   All the records are reviewed and case discussed with Care Management/Social Workerr. Management plans discussed with the patient, family and they are in agreement.  CODE STATUS: Full Code  TOTAL TIME TAKING CARE OF THIS PATIENT: 38 minutes.   POSSIBLE D/C IN 2 DAYS, DEPENDING ON CLINICAL CONDITION.   Enid BaasKALISETTI,Saajan Willmon M.D on 05/13/2016 at 8:52 AM  Between 7am to 6pm - Pager - [phone]  After 6pm go to [email or web address] - Social research officer, governmentpassword EPAS [facility]  [facility] [place] [facility]  Office  [phone]  CC: Primary care physician; Sherlene ShamsULLO, TERESA L, MD

## 2016-05-13 NOTE — [place] FL2 (Signed)
[place] MEDICAID FL2 LEVEL OF CARE SCREENING TOOL     IDENTIFICATION  Patient Name: [name] Birthdate: 1936/04/19 Sex: female Admission Date (Current Location): 05/12/2016  [facility] [place]  [place]  ([id number]) Facility and Address:  [facility], [address], [place], [place] [address]      Provider Number: [id number]  Attending Physician Name and Address:  Enid Baasadhika Kalisetti, MD  Relative Name and Phone Number:       Current Level of Care: Hospital Recommended Level of Care: Skilled Nursing Facility Prior Approval Number:    Date Approved/Denied:   PASRR Number:  [id number] )  Discharge Plan: SNF    Current Diagnoses: Patient Active Problem List   Diagnosis Date Noted  . UTI (urinary tract infection) 05/12/2016  . Pancytopenia (HCC) 09/25/2015  . Sepsis (HCC) 09/25/2015  . Hypothermia 09/25/2015  . Hypokalemia 09/25/2015  . CKD (chronic kidney disease), stage III 09/25/2015  . Pressure ulcer 09/24/2015  . Rhabdomyolysis 07/24/2015  . Encephalopathy, metabolic 07/18/2015  . Hospital discharge follow-up 06/21/2015  . Mixed Alzheimer's and vascular dementia 06/02/2015  . Schizoaffective disorder, bipolar type (HCC) 05/17/2015  . Psychosis 05/16/2015  . CKD (chronic kidney disease) stage 3, GFR 30-59 ml/min 05/03/2015  . Noncompliance with medication treatment due to overuse of medication 01/28/2015  . Hypothyroidism 01/28/2015  . Dysuria 08/07/2014  . Back pain of thoracolumbar region 09/23/2013  . Visit for preventive health examination 08/18/2013  . Obesity 07/07/2013  . Depression with anxiety 06/26/2013  . Hemorrhoids 11/06/2012  . Hidradenitis suppurativa 10/05/2012  . Venous insufficiency of leg 11/24/2011  . Type 2 diabetes, uncontrolled, with renal manifestation (HCC) 10/03/2011  . Anemia of chronic renal failure, stage 4 (severe) (HCC) 10/03/2011  . Chest pain with normal coronary angiography  04/21/2011  . Screening for colon cancer 04/12/2011  . Screening for breast cancer 04/12/2011  . Hypertension 04/12/2011  . Hyperlipidemia with target LDL less than 100 04/12/2011  . Screening for osteoporosis 04/12/2011    Orientation RESPIRATION BLADDER Height & Weight     Self, Time  Normal Continent Weight: 158 lb 11.7 oz (72 kg) Height:  5\' 4"  (162.6 cm)  BEHAVIORAL SYMPTOMS/MOOD NEUROLOGICAL BOWEL NUTRITION STATUS   (none)  (none) Continent Diet (Diet: DYS 2 )  AMBULATORY STATUS COMMUNICATION OF NEEDS Skin   Extensive Assist Verbally Normal                       Personal Care Assistance Level of Assistance  Bathing, Feeding, Dressing Bathing Assistance: Limited assistance Feeding assistance: Independent Dressing Assistance: Limited assistance     Functional Limitations Info  Sight, Hearing, Speech Sight Info: Adequate Hearing Info: Adequate Speech Info: Adequate    SPECIAL CARE FACTORS FREQUENCY                       Contractures      Additional Factors Info  Code Status, Allergies Code Status Info:  (Full Code. ) Allergies Info:  (Hydrocodone-acetaminophen, Morphine And Related)           Current Medications (05/13/2016):  This is the current hospital active medication list Current Facility-Administered Medications  Medication Dose Route Frequency Provider Last Rate Last Dose  . 0.9 %  sodium chloride infusion   Intravenous Continuous Houston SirenVivek J Sainani, MD 100 mL/hr at 05/12/16 1633    . acetaminophen (TYLENOL) tablet 650 mg  650 mg Oral Q6H PRN Houston SirenVivek J Sainani, MD  650 mg at 05/12/16 2347   Or  . acetaminophen (TYLENOL) suppository 650 mg  650 mg Rectal Q6H PRN Houston Siren, MD      . aspirin EC tablet 81 mg  81 mg Oral Daily Houston Siren, MD   81 mg at 05/13/16 1241  . carvedilol (COREG) tablet 25 mg  25 mg Oral Q breakfast Houston Siren, MD   25 mg at 05/13/16 1610  . cholecalciferol (VITAMIN D) tablet 1,000 Units  1,000 Units Oral  Daily Houston Siren, MD   1,000 Units at 05/13/16 1627  . cloNIDine (CATAPRES) tablet 0.3 mg  0.3 mg Oral BID Houston Siren, MD   0.3 mg at 05/13/16 1247  . gabapentin (NEURONTIN) capsule 100 mg  100 mg Oral QHS Houston Siren, MD   100 mg at 05/12/16 2348  . heparin injection 5,000 Units  5,000 Units Subcutaneous Q8H Houston Siren, MD   5,000 Units at 05/13/16 1625  . lamoTRIgine (LAMICTAL) tablet 25 mg  25 mg Oral Daily Houston Siren, MD   25 mg at 05/13/16 1242  . levothyroxine (SYNTHROID, LEVOTHROID) tablet 50 mcg  50 mcg Oral QAC breakfast Houston Siren, MD   50 mcg at 05/13/16 [phone]  . LORazepam (ATIVAN) tablet 0.5 mg  0.5 mg Oral Q8H PRN Houston Siren, MD      . memantine [facility]) tablet 5 mg  5 mg Oral Daily Houston Siren, MD   5 mg at 05/13/16 1242  . meropenem (MERREM) IVPB SOLR 500 mg  500 mg Intravenous Q12H Adrian Saran, MD   500 mg at 05/13/16 1626  . ondansetron (ZOFRAN) tablet 4 mg  4 mg Oral Q6H PRN Houston Siren, MD       Or  . ondansetron (ZOFRAN) injection 4 mg  4 mg Intravenous Q6H PRN Houston Siren, MD      . pantoprazole (PROTONIX) EC tablet 40 mg  40 mg Oral Daily Houston Siren, MD   40 mg at 05/13/16 1241  . risperiDONE (RISPERDAL) tablet 3 mg  3 mg Oral QHS Houston Siren, MD   3 mg at 05/12/16 2348  . senna-docusate (Senokot-S) tablet 1 tablet  1 tablet Oral Daily Houston Siren, MD   1 tablet at 05/13/16 1241     Discharge Medications: Please see discharge summary for a list of discharge medications.  Relevant Imaging Results:  Relevant Lab Results:   Additional Information  (SSN: [id number])  Safwan Tomei, Darleen Crocker, LCSW

## 2016-05-13 NOTE — Clinical Social Work Note (Signed)
Clinical Social Work Assessment  Patient Details  Name: [name] MRN: [id number] Date of Birth: 08/16/1935  Date of referral:  05/13/16               Reason for consult:  Other (Comment Required) (From [facility] )                Permission sought to share information with:  Chartered certified accountant granted to share information::  Yes, Verbal Permission Granted  Name::      Fish farm manager::   [place]   Relationship::     Contact Information:     Housing/Transportation Living arrangements for the past 2 months:  [place] of Information:  [place], Adult Children Patient Interpreter Needed:  None Criminal Activity/Legal Involvement Pertinent to Current Situation/Hospitalization:  No - Comment as needed Significant Relationships:  Adult Children Lives with:  Facility Resident Do you feel safe going back to the place where you live?  Yes Need for family participation in patient care:  Yes (Comment)  Care giving concerns:  Patient is a long term care resident at [facility].    Social Worker assessment / plan:  Holiday representative (CSW) received consult that patient is from [facility]. Per [facility] admissions coordinator at [facility] patient is a long term care resident and can return when stable. CSW met with patient and her son Alvester Chou and daughter Rennis Harding were at bedside. Patient was pleasantly confused and did not participate in assessment. Per daughter she is HPOA and her and Gwenlyn Found are patient's only children. Per daughter patient has been at [facility] since [place] 2017 and is a long term care resident there. Daughter reported that patient was doing PT at first however she is no longer doing PT. Daughter feels that patient does not need PT because she gets around well with her walker. Daughter and son are agreeable for patient to return to [facility]. FL2 complete. CSW  will continue to follow and assist as needed.   Employment status:  Retired Forensic scientist:  Medicaid [place], [employer] PT Recommendations:  Not assessed at this time [place] / Referral to community resources:  [place]  Patient/Family's Response to care:  Patient's family is agreeable for her to return to [facility].   Patient/Family's Understanding of and Emotional Response to Diagnosis, Current Treatment, and Prognosis:  Patient and her family were very pleasant and thanked CSW for visit.   Emotional Assessment Appearance:  Appears stated age Attitude/Demeanor/Rapport:  Lethargic Affect (typically observed):  Pleasant Orientation:  Oriented to Self, Oriented to  Time, Fluctuating Orientation (Suspected and/or reported Sund[place]) Alcohol / Substance use:  Not Applicable Psych involvement (Current and /or in the community):  No (Comment)  Discharge Needs  Concerns to be addressed:  Discharge Planning Concerns Readmission within the last 30 days:  No Current discharge risk:  Dependent with Mobility, Cognitively Impaired, Chronically ill Barriers to Discharge:  Continued Medical Work up   [employer], Veronia Beets, LCSW 05/13/2016, 4:50 PM

## 2016-05-13 NOTE — Progress Notes (Signed)
PHARMACY - PHYSICIAN COMMUNICATION CRITICAL VALUE ALERT - BLOOD CULTURE IDENTIFICATION (BCID)  Results for orders placed or performed during the hospital encounter of 05/12/16  Blood Culture ID Panel (Reflexed) (Collected: 05/12/2016 11:44 AM)  Result Value Ref Range   Enterococcus species NOT DETECTED NOT DETECTED   Listeria monocytogenes NOT DETECTED NOT DETECTED   Staphylococcus species NOT DETECTED NOT DETECTED   Staphylococcus aureus NOT DETECTED NOT DETECTED   Streptococcus species NOT DETECTED NOT DETECTED   Streptococcus agalactiae NOT DETECTED NOT DETECTED   Streptococcus pneumoniae NOT DETECTED NOT DETECTED   Streptococcus pyogenes NOT DETECTED NOT DETECTED   Acinetobacter baumannii NOT DETECTED NOT DETECTED   Enterobacteriaceae species DETECTED (A) NOT DETECTED   Enterobacter cloacae complex NOT DETECTED NOT DETECTED   Escherichia coli DETECTED (A) NOT DETECTED   Klebsiella oxytoca NOT DETECTED NOT DETECTED   Klebsiella pneumoniae NOT DETECTED NOT DETECTED   Proteus species NOT DETECTED NOT DETECTED   Serratia marcescens NOT DETECTED NOT DETECTED   Carbapenem resistance NOT DETECTED NOT DETECTED   Haemophilus influenzae NOT DETECTED NOT DETECTED   Neisseria meningitidis NOT DETECTED NOT DETECTED   Pseudomonas aeruginosa NOT DETECTED NOT DETECTED   Candida albicans NOT DETECTED NOT DETECTED   Candida glabrata NOT DETECTED NOT DETECTED   Candida krusei NOT DETECTED NOT DETECTED   Candida parapsilosis NOT DETECTED NOT DETECTED   Candida tropicalis NOT DETECTED NOT DETECTED    Name of physician (or Provider) Contacted: Dr. Juliene PinaMody  Changes to prescribed antibiotics required: discontinue ceftriaxone, start meropenem 500 mg IV Q12H  Carola FrostNathan A Ashantia Amaral, Pharm.D., BCPS Clinical Pharmacist 05/13/2016  3:02 AM

## 2016-05-13 NOTE — Progress Notes (Signed)
Pt afebrile after receiving tylenol. Rested quietly during the night with no acute distress noted. BP remains low. No acute distress noted. Staff will continue to monitor

## 2016-05-13 NOTE — Progress Notes (Signed)
Blood pressure 116/32. Pt resting, but had to be shaken to arouse her. Dr Juliene PinaMody notified of blood pressure. No  New order received at this time

## 2016-05-14 DIAGNOSIS — L8990 Pressure ulcer of unspecified site, unspecified stage: Secondary | ICD-10-CM | POA: Insufficient documentation

## 2016-05-14 LAB — BASIC METABOLIC PANEL
ANION GAP: 6 (ref 5–15)
BUN: 55 mg/dL — ABNORMAL HIGH (ref 6–20)
CO2: 21 mmol/L — ABNORMAL LOW (ref 22–32)
CREATININE: 2.9 mg/dL — AB (ref 0.44–1.00)
Calcium: 8.7 mg/dL — ABNORMAL LOW (ref 8.9–10.3)
Chloride: 115 mmol/L — ABNORMAL HIGH (ref 101–111)
GFR calc non Af Amer: 15 mL/min — ABNORMAL LOW (ref >60)
GFR, EST AFRICAN AMERICAN: 17 mL/min — AB (ref >60)
Glucose, Bld: 123 mg/dL — ABNORMAL HIGH (ref 65–99)
Potassium: 3.6 mmol/L (ref 3.5–5.1)
SODIUM: 142 mmol/L (ref 135–145)

## 2016-05-14 NOTE — Progress Notes (Signed)
[facility] - [place] at [facility]   PATIENT NAME: Sula Fetterly    MR#:  [id number]  DATE OF BIRTH:  1935-12-01  SUBJECTIVE:  CHIEF COMPLAINT:   Chief Complaint  Patient presents with  . Code Sepsis   - More alert today, pleasantly confused and trying to feed herself. -Blood cultures and urine cultures with Escherichia coli. Still having low-grade fevers  REVIEW OF SYSTEMS:  Review of Systems  Unable to perform ROS: Dementia  Constitutional: Positive for fever.    DRUG ALLERGIES:   Allergies  Allergen Reactions  . Hydrocodone-Acetaminophen Nausea And Vomiting and Other (See Comments)    Reaction:  Dizziness   . Morphine And Related Other (See Comments)    Reaction:  Hallucinations     VITALS:  Blood pressure (!) 143/40, pulse 85, temperature (!) 100.7 F (38.2 C), temperature source Oral, resp. rate (!) 22, height 5\' 4"  (1.626 m), weight 72 kg (158 lb 11.7 oz), SpO2 96 %.  PHYSICAL EXAMINATION:  Physical Exam  GENERAL:  81 y.o.-year-old patient lying in the bed with no acute distress.Alert today EYES: Pupils equal, round, reactive to light and accommodation. No scleral icterus. Extraocular muscles intact.  HEENT: Head atraumatic, normocephalic. Oropharynx and nasopharynx clear.  NECK:  Supple, no jugular venous distention. No thyroid enlargement, no tenderness.  LUNGS: Normal breath sounds bilaterally, no wheezing, rales,rhonchi or crepitation. No use of accessory muscles of respiration. Decreased bibasilar breath sounds CARDIOVASCULAR: S1, S2 normal. No rubs, or gallops. 2/6 systolic murmur present. ABDOMEN: Soft, nontender, nondistended. Bowel sounds present. No organomegaly or mass.  EXTREMITIES: No pedal edema, cyanosis, or clubbing.  NEUROLOGIC: Alert and following simple commands. Global weakness noted. Able to move all extremities in bed. Sensation is intact. PSYCHIATRIC: The patient is alert but not oriented. Pleasantly confused SKIN: No  obvious rash, lesion, or ulcer.    LABORATORY PANEL:   CBC  Recent Labs Lab 05/13/16 0535  WBC 6.7  HGB 8.5*  HCT 24.2*  PLT 156   ------------------------------------------------------------------------------------------------------------------  Chemistries   Recent Labs Lab 05/12/16 1144  05/14/16 0542  NA 141  < > 142  K 4.5  < > 3.6  CL 109  < > 115*  CO2 25  < > 21*  GLUCOSE 164*  < > 123*  BUN 59*  < > 55*  CREATININE 4.25*  < > 2.90*  CALCIUM 8.9  < > 8.7*  AST 27  --   --   ALT 57*  --   --   ALKPHOS 137*  --   --   BILITOT 1.6*  --   --   < > = values in this interval not displayed. ------------------------------------------------------------------------------------------------------------------  Cardiac Enzymes No results for input(s): TROPONINI in the last 168 hours. ------------------------------------------------------------------------------------------------------------------  RADIOLOGY:  Dg Chest 2 View  Result Date: 05/12/2016 CLINICAL DATA:  Altered mental status.  Fever.  Low blood pressure. EXAM: CHEST  2 VIEW COMPARISON:  09/22/2015.  09/07/2014.  09/03/2014.  01/06/2014. FINDINGS: Mediastinum and hilar structures are normal. Mild right perihilar and right base infiltrate. No pleural effusion or pneumothorax. Heart size stable. No acute bony abnormality. IMPRESSION: Mild right infrahilar and right base infiltrate. Electronically Signed   By: Maisie Fus  Register   On: 05/12/2016 12:53    EKG:   Orders placed or performed during the hospital encounter of 05/12/16  . EKG 12-Lead  . EKG 12-Lead    ASSESSMENT AND PLAN:   81 year old female with past mental history of  dementia, hypertension, hyperlipidemia, history of CHF, GERD, diabetes who presented to the hospital due to altered mental status, fever and noted to have sepsis secondary to urinary tract infection  1. Sepsis- secondary to acute cystitis - blood and urine cultures are Growing  Escherichia coli - BCID positive for E.coli and enterobacteriae- on meropenem. -Narrow antibiotics once sensitivities are available -Continues to have low-grade fevers  2. ARF- on CKD-  baseline cr of 1.4 Cr was 4.25 on admission- ATN from sepsis - discontinue nephrotoxins - hold lisinopril, IV fluids, slowly improving, creatinine at 2.9 today  3. Chronic anemia-hemoglobin close to baseline. No acute indication for transfusion.  4. Dementia-continue Namenda, Risperdal. Pleasantly confused at baseline Seems to be at baseline now  5. Metabolic encephalopathy- from sepsis, much improved today. Close to her baseline  6. Hypothyroidism-continue Synthroid.  7. Essential hypertension-continue carvedilol, clonidine.   Physical Therapy Plan to return to [facility] as patient is a long term resident there   All the records are reviewed and case discussed with Care Management/Social Workerr. Management plans discussed with the patient, family and they are in agreement.  CODE STATUS: Full Code  TOTAL TIME TAKING CARE OF THIS PATIENT: 34 minutes.   POSSIBLE D/C IN 2 DAYS, DEPENDING ON CLINICAL CONDITION.   Enid BaasKALISETTI,Esthefany Herrig M.D on 05/14/2016 at 11:00 AM  Between 7am to 6pm - Pager - [phone]  After 6pm go to [email or web address] - Social research officer, governmentpassword EPAS [facility]  [facility] [place] [facility]  Office  [phone]  CC: Primary care physician; Sherlene ShamsULLO, TERESA L, MD

## 2016-05-14 NOTE — Care Management Important Message (Signed)
Important Message  Patient Details  Name: [name] MRN: [id number] Date of Birth: [birth year]   Medicare Important Message Given:  Yes  Copy of signed IM delivered to patient.   Collie SiadAngela Mckinnley Smithey, RN 05/14/2016, 8:01 AM

## 2016-05-14 NOTE — Progress Notes (Signed)
Per RN in progression rounds this morning MD stated that patient will not be ready for D/C until Monday 05/17/16. Plan is for [facility] to [facility] when medically stable. [facility] is aware of above.   [employer]Bailey Lerone Onder, LCSW [phone]

## 2016-05-15 LAB — CULTURE, BLOOD (ROUTINE X 2)

## 2016-05-15 LAB — BASIC METABOLIC PANEL
ANION GAP: 6 (ref 5–15)
BUN: 43 mg/dL — ABNORMAL HIGH (ref 6–20)
CALCIUM: 8.4 mg/dL — AB (ref 8.9–10.3)
CO2: 21 mmol/L — AB (ref 22–32)
Chloride: 116 mmol/L — ABNORMAL HIGH (ref 101–111)
Creatinine, Ser: 2.17 mg/dL — ABNORMAL HIGH (ref 0.44–1.00)
GFR, EST AFRICAN AMERICAN: 24 mL/min — AB (ref >60)
GFR, EST NON AFRICAN AMERICAN: 21 mL/min — AB (ref >60)
Glucose, Bld: 166 mg/dL — ABNORMAL HIGH (ref 65–99)
Potassium: 3.7 mmol/L (ref 3.5–5.1)
Sodium: 143 mmol/L (ref 135–145)

## 2016-05-15 LAB — CBC
HEMATOCRIT: 25.3 % — AB (ref 35.0–47.0)
Hemoglobin: 8.9 g/dL — ABNORMAL LOW (ref 12.0–16.0)
MCH: 33 pg (ref 26.0–34.0)
MCHC: 35.2 g/dL (ref 32.0–36.0)
MCV: 93.6 fL (ref 80.0–100.0)
Platelets: 185 10*3/uL (ref 150–440)
RBC: 2.7 MIL/uL — ABNORMAL LOW (ref 3.80–5.20)
RDW: 14.8 % — AB (ref 11.5–14.5)
WBC: 4.7 10*3/uL (ref 3.6–11.0)

## 2016-05-15 LAB — URINE CULTURE: Culture: >=100000 — AB

## 2016-05-15 MED ORDER — CEFTRIAXONE SODIUM-DEXTROSE 2-2.22 GM-% IV SOLR
2.0000 g | INTRAVENOUS | Status: DC
  Administered 2016-05-15: 2 g via INTRAVENOUS
  Filled 2016-05-15 (×3): qty 50

## 2016-05-15 NOTE — Progress Notes (Signed)
[facility] - [place] at [facility]   PATIENT NAME: [name]    MR#:  [id number]  DATE OF BIRTH:  1935/08/04  SUBJECTIVE:   - More alert today, pleasantly confused and trying to feed herself. -Blood cultures and urine cultures with Escherichia coli. Still having low-grade fevers  REVIEW OF SYSTEMS:  Review of Systems  Unable to perform ROS: Dementia  Constitutional: Positive for fever.    DRUG ALLERGIES:   Allergies  Allergen Reactions  . Hydrocodone-Acetaminophen Nausea And Vomiting and Other (See Comments)    Reaction:  Dizziness   . Morphine And Related Other (See Comments)    Reaction:  Hallucinations     VITALS:  Blood pressure (!) 152/44, pulse 80, temperature 100.3 F (37.9 C), temperature source Oral, resp. rate 18, height 5\' 4"  (1.626 m), weight 72 kg (158 lb 11.7 oz), SpO2 94 %.  PHYSICAL EXAMINATION:  Physical Exam  GENERAL:  81 year old patient lying in the bed with no acute distress.Alert today EYES: Pupils equal, round, reactive to light and accommodation. No scleral icterus. Extraocular muscles intact.  HEENT: Head atraumatic, normocephalic. Oropharynx and nasopharynx clear.  NECK:  Supple, no jugular venous distention. No thyroid enlargement, no tenderness.  LUNGS: Normal breath sounds bilaterally, no wheezing, rales,rhonchi or crepitation. No use of accessory muscles of respiration. Decreased bibasilar breath sounds CARDIOVASCULAR: S1, S2 normal. No rubs, or gallops. 2/6 systolic murmur present. ABDOMEN: Soft, nontender, nondistended. Bowel sounds present. No organomegaly or mass.  EXTREMITIES: No pedal edema, cyanosis, or clubbing.  NEUROLOGIC: Alert and following simple commands. Global weakness noted. Able to move all extremities in bed. Sensation is intact. PSYCHIATRIC: The patient is alert but not oriented. Pleasantly confused SKIN: No obvious rash, lesion, or ulcer.    LABORATORY PANEL:   CBC  Recent Labs Lab  05/15/16 0413  WBC 4.7  HGB 8.9*  HCT 25.3*  PLT 185   ------------------------------------------------------------------------------------------------------------------  Chemistries   Recent Labs Lab 05/12/16 1144  05/15/16 0413  NA 141  < > 143  K 4.5  < > 3.7  CL 109  < > 116*  CO2 25  < > 21*  GLUCOSE 164*  < > 166*  BUN 59*  < > 43*  CREATININE 4.25*  < > 2.17*  CALCIUM 8.9  < > 8.4*  AST 27  --   --   ALT 57*  --   --   ALKPHOS 137*  --   --   BILITOT 1.6*  --   --   < > = values in this interval not displayed. ------------------------------------------------------------------------------------------------------------------  Cardiac Enzymes No results for input(s): TROPONINI in the last 168 hours. ------------------------------------------------------------------------------------------------------------------  RADIOLOGY:  No results found.  EKG:   Orders placed or performed during the hospital encounter of 05/12/16  . EKG 12-Lead  . EKG 12-Lead    ASSESSMENT AND PLAN:   81 year old female with past mental history of dementia, hypertension, hyperlipidemia, history of CHF, GERD, diabetes who presented to the hospital due to altered mental status, fever and noted to have sepsis secondary to urinary tract infection  1. Sepsis- secondary to acute cystitis - blood and urine cultures are Growing Escherichia coli - BCID positive for E.coli and enterobacteriae- on meropenem---change to rocephin according to the c/s -Narrow antibiotics once sensitivities are available -Continues to have low-grade fevers  2. ARF- on CKD-  baseline cr of 2.1 Cr was 4.25 on admission- ATN from sepsis---2.17 - discontinue nephrotoxins - hold lisinopril, IV fluids,  slowly improving  3. Chronic anemia-hemoglobin close to baseline. No acute indication for transfusion.  4. Dementia-continue Namenda, Risperdal. Pleasantly confused at baseline Seems to be at baseline now  5.  Metabolic encephalopathy- from sepsis, much improved today. Close to her baseline  6. Hypothyroidism-continue Synthroid.  7. Essential hypertension-continue carvedilol, clonidine.   Physical Therapy Plan to return to [facility] as patient is a long term resident there  Spoke with dter coretta All the records are reviewed and case discussed with Care Management/Social Workerr. Management plans discussed with the patient, family and they are in agreement.  CODE STATUS: Full Code  TOTAL TIME TAKING CARE OF THIS PATIENT: 30 minutes.   POSSIBLE D/C IN 2 DAYS, DEPENDING ON CLINICAL CONDITION.   Jossue Rubenstein M.D on 05/15/2016 at 2:09 PM  Between 7am to 6pm - Pager - [phone]  After 6pm go to [email or web address] - Social research officer, governmentpassword EPAS [facility]  [facility]  Office  [phone]  CC: Primary care physician; Sherlene ShamsULLO, TERESA L, MD

## 2016-05-15 NOTE — Progress Notes (Addendum)
[facility], would like updates on pt's care, please call at [phone], or [phone] if not available on cell. Also stated she would like copy of lab results, she will request from medical records.

## 2016-05-16 NOTE — Progress Notes (Addendum)
[facility] - [place] at [facility]   PATIENT NAME: [name]    MR#:  [id number]  DATE OF BIRTH:  07/02/1935  SUBJECTIVE:   - More alert today, pleasantly confused and trying to feed herself. -Blood cultures and urine cultures with Escherichia coli.  -afebrile  REVIEW OF SYSTEMS:  Review of Systems  Unable to perform ROS: Dementia  Constitutional: Positive for fever. Negative for chills and weight loss.  HENT: Negative for ear discharge, ear pain and nosebleeds.   Eyes: Negative for blurred vision, pain and discharge.  Respiratory: Negative for sputum production, shortness of breath, wheezing and stridor.   Cardiovascular: Negative for chest pain, palpitations, orthopnea and PND.  Gastrointestinal: Negative for abdominal pain, diarrhea, nausea and vomiting.  Genitourinary: Negative for frequency and urgency.  Musculoskeletal: Negative for back pain and joint pain.  Neurological: Positive for weakness. Negative for sensory change, speech change and focal weakness.  Psychiatric/Behavioral: Negative for depression and hallucinations. The patient is not nervous/anxious.     DRUG ALLERGIES:   Allergies  Allergen Reactions  . Hydrocodone-Acetaminophen Nausea And Vomiting and Other (See Comments)    Reaction:  Dizziness   . Morphine And Related Other (See Comments)    Reaction:  Hallucinations     VITALS:  Blood pressure (!) 164/44, pulse 78, temperature 98.9 F (37.2 C), temperature source Oral, resp. rate 18, height 5\' 4"  (1.626 m), weight 72 kg (158 lb 11.7 oz), SpO2 97 %.  PHYSICAL EXAMINATION:  Physical Exam  GENERAL:  81 y.o.-year-old patient lying in the bed with no acute distress.Alert today EYES: Pupils equal, round, reactive to light and accommodation. No scleral icterus. Extraocular muscles intact.  HEENT: Head atraumatic, normocephalic. Oropharynx and nasopharynx clear.  NECK:  Supple, no jugular venous distention. No thyroid enlargement,  no tenderness.  LUNGS: Normal breath sounds bilaterally, no wheezing, rales,rhonchi or crepitation. No use of accessory muscles of respiration. Decreased bibasilar breath sounds CARDIOVASCULAR: S1, S2 normal. No rubs, or gallops. 2/6 systolic murmur present. ABDOMEN: Soft, nontender, nondistended. Bowel sounds present. No organomegaly or mass.  EXTREMITIES: No pedal edema, cyanosis, or clubbing.  NEUROLOGIC: Alert and following simple commands. Global weakness noted. Able to move all extremities in bed. Sensation is intact. PSYCHIATRIC: The patient is alert but not oriented. Pleasantly confused SKIN: No obvious rash, lesion, or ulcer.    LABORATORY PANEL:   CBC  Recent Labs Lab 05/15/16 0413  WBC 4.7  HGB 8.9*  HCT 25.3*  PLT 185   ------------------------------------------------------------------------------------------------------------------  Chemistries   Recent Labs Lab 05/12/16 1144  05/15/16 0413  NA 141  < > 143  K 4.5  < > 3.7  CL 109  < > 116*  CO2 25  < > 21*  GLUCOSE 164*  < > 166*  BUN 59*  < > 43*  CREATININE 4.25*  < > 2.17*  CALCIUM 8.9  < > 8.4*  AST 27  --   --   ALT 57*  --   --   ALKPHOS 137*  --   --   BILITOT 1.6*  --   --   < > = values in this interval not displayed. ------------------------------------------------------------------------------------------------------------------  Cardiac Enzymes No results for input(s): TROPONINI in the last 168 hours. ------------------------------------------------------------------------------------------------------------------  RADIOLOGY:  No results found.  EKG:   Orders placed or performed during the hospital encounter of 05/12/16  . EKG 12-Lead  . EKG 12-Lead    ASSESSMENT AND PLAN:   81 year old female with past  mental history of dementia, hypertension, hyperlipidemia, history of CHF, GERD, diabetes who presented to the hospital due to altered mental status, fever and noted to have sepsis  secondary to urinary tract infection  1. Sepsis- secondary to acute cystitis - blood and urine cultures are Growing Escherichia coli - BCID positive for E.coli and enterobacteriae- on meropenem---change to rocephin according to the c/s -Narrow antibiotics once sensitivities are available -Continues to have low-grade fevers  2. ARF- on CKD-  baseline cr of 2.1 Cr was 4.25 on admission- ATN from sepsis---2.17 - discontinue nephrotoxins - d/c lisinopril initially due to elevated creatinine  3. Chronic anemia-hemoglobin close to baseline. No acute indication for transfusion.  4. Dementia-continue Namenda, Risperdal. Pleasantly confused at baseline Seems to be at baseline now  5. Metabolic encephalopathy- from sepsis, much improved today. Close to her baseline  6. Hypothyroidism-continue Synthroid.  7. Essential hypertension-continue carvedilol, clonidine.   Physical Therapy Plan to return to [facility] as patient is a long term resident there  Spoke with dter [name] All the records are reviewed and case discussed with Care Management/Social Workerr. Management plans discussed with the patient, family and they are in agreement.  CODE STATUS: Full Code  TOTAL TIME TAKING CARE OF THIS PATIENT: 30 minutes.   POSSIBLE D/C IN 2 DAYS, DEPENDING ON CLINICAL CONDITION.   [name] M.D on 05/16/2016 at 1:24 PM  Between 7am to 6pm - Pager - [phone]  After 6pm go to [email or web address] - Social research officer, governmentpassword EPAS [facility]  [facility] [place] [facility]  Office  [phone]  CC: Primary care physician; Sherlene ShamsULLO, [name] L, MD

## 2016-05-17 MED ORDER — CEFUROXIME AXETIL 500 MG PO TABS: 500.0000 mg | ORAL_TABLET | Freq: Two times a day (BID) | ORAL | 0 refills | Status: DC

## 2016-05-17 MED ORDER — HYDRALAZINE HCL 20 MG/ML IJ SOLN
10.0000 mg | Freq: Once | INTRAMUSCULAR | Status: AC
  Administered 2016-05-17: 10 mg via INTRAVENOUS
  Filled 2016-05-17: qty 1

## 2016-05-17 MED ORDER — CEFUROXIME AXETIL 500 MG PO TABS
500.0000 mg | ORAL_TABLET | Freq: Two times a day (BID) | ORAL | Status: DC
  Administered 2016-05-17: 500 mg via ORAL
  Filled 2016-05-17: qty 1

## 2016-05-17 NOTE — Progress Notes (Addendum)
Patient is medically stable for D/C back to [facility] today. [facility] patient will go back to [employer] hall, RN will call report and arrange EMS for transport. Clinical Child psychotherapistocial Worker (CSW) sent D/C orders to [facility] via [employer]. Patient is aware of above. CSW attempted to contact patient's daughter Marella BileCoretta however she did not answer and her voicemail was full. CSW contacted patient's son Gery PrayBarry and made him aware of above. Please reconsult if future social work needs arise. CSW signing off.   Patient's daughter Marella BileCoretta called CSW back and is in agreement with D/C plan.   [employer]Bailey Rafaella Kole, LCSW [phone]

## 2016-05-17 NOTE — Progress Notes (Signed)
Patient's daughter Marella BileCoretta took her black purse home and will return it back to patient once she is at [facility]

## 2016-05-17 NOTE — Progress Notes (Signed)
Pts BP 168/54, Scheduled BP meds given. MD Allena KatzPatel notified. No new orders at this time

## 2016-05-17 NOTE — Progress Notes (Signed)
[facility]  , EMS called to transportation.

## 2016-05-17 NOTE — Progress Notes (Signed)
Pts BP 170/56 after IV 10 mg Hydralazine, MD Allena KatzPatel notified per MD pt still ok to d/c.

## 2016-05-17 NOTE — Progress Notes (Signed)
PTs BP 177/50, MD Patel notifed. Orders received for IV Hydralazine 10 mg Q1 Order placed.

## 2016-05-17 NOTE — Discharge Summary (Signed)
[facility] - [place] at [facility]   PATIENT NAME: [name]    MR#:  [id number]  DATE OF BIRTH:  1935-12-19  DATE OF ADMISSION:  05/12/2016 ADMITTING PHYSICIAN: Houston Siren, MD  DATE OF DISCHARGE: 05/17/2016  PRIMARY CARE PHYSICIAN: Sherlene Shams, MD    ADMISSION DIAGNOSIS:  Acute encephalopathy [G93.40] AKI (acute kidney injury) (HCC) [N17.9] Acute pyelonephritis [N10] Sepsis, due to unspecified organism (HCC) [A41.9]  DISCHARGE DIAGNOSIS:  Escherichia coli sepsis Acute cystitis due to Escherichia coli Acute on chronic renal failure secondary to ATN in the setting of sepsis  SECONDARY DIAGNOSIS:   Past Medical History:  Diagnosis Date  . Anemia   . Arthritis   . CHF (congestive heart failure) (HCC)   . Diabetes mellitus   . GERD (gastroesophageal reflux disease)   . Hemorrhoids   . Hypertension   . Hypertension   . Renal disorder     HOSPITAL COURSE:   81 year old female with past mental history of dementia, hypertension, hyperlipidemia, history of CHF, GERD, diabetes who presented to the hospital due to altered mental status, fever and noted to have sepsis secondary to urinary tract infection  1. Sepsis- secondary to acute cystitis - blood and urine cultures are Growing Escherichia coli - BCID positive for E.coli and enterobacteriae- on meropenem---changed to rocephin according to the c/s--now on by mouth Ceftin -Narrow antibiotics once sensitivities are available -Continues to have low-grade fevers  2. ARF- on CKD-  baseline cr of 2.1 Cr was 4.25 on admission- ATN from sepsis---2.17 - discontinue nephrotoxins - d/c lisinopril initially due to elevated creatinine  3. Chronic anemia-hemoglobin close to baseline. No acute indication for transfusion.  4. Dementia-continue Namenda, Risperdal. Pleasantly confused at baseline Seems to be at baseline now  5. Metabolic encephalopathy- from sepsis, much improved today.  Close to her baseline  6. Hypothyroidism-continue Synthroid.  7. Essential hypertension-continue carvedilol, clonidine.   Physical Therapy recommends rehabilitation Plan to return to [facility] as patient is a long term resident there    CONSULTS OBTAINED:    DRUG ALLERGIES:   Allergies  Allergen Reactions  . Hydrocodone-Acetaminophen Nausea And Vomiting and Other (See Comments)    Reaction:  Dizziness   . Morphine And Related Other (See Comments)    Reaction:  Hallucinations     DISCHARGE MEDICATIONS:   Current Discharge Medication List    START taking these medications   Details  cefUROXime (CEFTIN) 500 MG tablet Take 1 tablet (500 mg total) by mouth 2 (two) times daily with a meal. Qty: 12 tablet, Refills: 0      CONTINUE these medications which have NOT CHANGED   Details  aspirin EC 81 MG tablet Take 81 mg by mouth daily.    carvedilol (COREG) 25 MG tablet Take 25 mg by mouth daily with breakfast.    cholecalciferol (VITAMIN D) 1000 units tablet Take 1,000 Units by mouth daily.    cloNIDine (CATAPRES) 0.3 MG tablet Take 0.3 mg by mouth 2 (two) times daily.    furosemide (LASIX) 40 MG tablet Take 80 mg by mouth 2 (two) times daily.     gabapentin (NEURONTIN) 100 MG capsule Take 100 mg by mouth at bedtime.    lamoTRIgine (LAMICTAL) 25 MG tablet Take 25 mg by mouth daily.    levothyroxine (SYNTHROID, LEVOTHROID) 50 MCG tablet Take 1 tablet (50 mcg total) by mouth daily before breakfast. Qty: 90 tablet, Refills: 1    LORazepam (ATIVAN) 0.5 MG tablet Take 0.5  mg by mouth every 8 (eight) hours as needed for anxiety.    memantine (NAMENDA) 5 MG tablet Take 5 mg by mouth daily.     omeprazole (PRILOSEC) 40 MG capsule Take 1 capsule (40 mg total) by mouth daily. Qty: 30 capsule, Refills: 0    polyvinyl alcohol (LIQUIFILM TEARS) 1.4 % ophthalmic solution Place 1 drop into both eyes 2 (two) times daily.    potassium chloride (KLOR-CON) 20 MEQ packet  Take by mouth 2 (two) times daily.    risperiDONE (RISPERDAL) 3 MG tablet Take 1 tablet (3 mg total) by mouth 2 (two) times daily. Qty: 60 tablet, Refills: 5    sennosides-docusate sodium (SENOKOT-S) 8.6-50 MG tablet Take 1 tablet by mouth daily.    ALPRAZolam (XANAX) 0.5 MG tablet Take 1 tablet (0.5 mg total) by mouth 2 (two) times daily as needed for anxiety. Qty: 60 tablet, Refills: 0      STOP taking these medications     lisinopril (PRINIVIL,ZESTRIL) 5 MG tablet         If you experience worsening of your admission symptoms, develop shortness of breath, life threatening emergency, suicidal or homicidal thoughts you must seek medical attention immediately by calling 911 or calling your MD immediately  if symptoms less severe.  You Must read complete instructions/literature along with all the possible adverse reactions/side effects for all the Medicines you take and that have been prescribed to you. Take any new Medicines after you have completely understood and accept all the possible adverse reactions/side effects.   Please note  You were cared for by a hospitalist during your hospital stay. If you have any questions about your discharge medications or the care you received while you were in the hospital after you are discharged, you can call the unit and asked to speak with the hospitalist on call if the hospitalist that took care of you is not available. Once you are discharged, your primary care physician will handle any further medical issues. Please note that NO REFILLS for any discharge medications will be authorized once you are discharged, as it is imperative that you return to your primary care physician (or establish a relationship with a primary care physician if you do not have one) for your aftercare needs so that they can reassess your need for medications and monitor your lab values. Today   SUBJECTIVE   Doing okay. Denies any complaints. Tells me she is on the  bedpan.  VITAL SIGNS:  Blood pressure (!) 168/54, pulse 72, temperature 98.4 F (36.9 C), temperature source Oral, resp. rate 18, height 5\' 4"  (1.626 m), weight 72 kg (158 lb 11.7 oz), SpO2 99 %.  I/O:    Intake/Output Summary (Last 24 hours) at 05/17/16 0816 Last data filed at 05/16/16 1851  Gross per 24 hour  Intake              240 ml  Output                0 ml  Net              240 ml    PHYSICAL EXAMINATION:  GENERAL:  81 y.o.-year-old patient lying in the bed with no acute distress.  EYES: Pupils equal, round, reactive to light and accommodation. No scleral icterus. Extraocular muscles intact.  HEENT: Head atraumatic, normocephalic. Oropharynx and nasopharynx clear.  NECK:  Supple, no jugular venous distention. No thyroid enlargement, no tenderness.  LUNGS: Normal breath sounds bilaterally, no wheezing,  rales,rhonchi or crepitation. No use of accessory muscles of respiration.  CARDIOVASCULAR: S1, S2 normal. No murmurs, rubs, or gallops.  ABDOMEN: Soft, non-tender, non-distended. Bowel sounds present. No organomegaly or mass.  EXTREMITIES: No pedal edema, cyanosis, or clubbing.  NEUROLOGIC: Cranial nerves II through XII are intact. Muscle strength 5/5 in all extremities. Sensation intact. Gait not checked.  PSYCHIATRIC: The patient is alert and oriented x 3.  SKIN: No obvious rash, lesion, or ulcer.   DATA REVIEW:   CBC   Recent Labs Lab 05/15/16 0413  WBC 4.7  HGB 8.9*  HCT 25.3*  PLT 185    Chemistries   Recent Labs Lab 05/12/16 1144  05/15/16 0413  NA 141  < > 143  K 4.5  < > 3.7  CL 109  < > 116*  CO2 25  < > 21*  GLUCOSE 164*  < > 166*  BUN 59*  < > 43*  CREATININE 4.25*  < > 2.17*  CALCIUM 8.9  < > 8.4*  AST 27  --   --   ALT 57*  --   --   ALKPHOS 137*  --   --   BILITOT 1.6*  --   --   < > = values in this interval not displayed.  Microbiology Results   Recent Results (from the past 240 hour(s))  Culture, blood (Routine x 2)     Status:  Abnormal   Collection Time: 05/12/16 11:44 AM  Result Value Ref Range Status   Specimen Description BLOOD FEMORAL STICK  Final   Special Requests   Final    BOTTLES DRAWN AEROBIC AND ANAEROBIC AER ANA   Culture  Setup Time   Final    GRAM NEGATIVE RODS IN BOTH AEROBIC AND ANAEROBIC BOTTLES CRITICAL RESULT CALLED TO, READ BACK BY AND VERIFIED WITH: NATE COOKSON ON 05/13/16 AT 0257 QSD    Culture (A)  Final    ESCHERICHIA COLI SUSCEPTIBILITIES PERFORMED ON PREVIOUS CULTURE WITHIN THE LAST 5 DAYS. Performed at [facility], [address] [place], [place] [address]    Report Status 05/15/2016 FINAL  Final  Blood Culture ID Panel (Reflexed)     Status: Abnormal   Collection Time: 05/12/16 11:44 AM  Result Value Ref Range Status   Enterococcus species NOT DETECTED NOT DETECTED Final   Listeria monocytogenes NOT DETECTED NOT DETECTED Final   Staphylococcus species NOT DETECTED NOT DETECTED Final   Staphylococcus aureus NOT DETECTED NOT DETECTED Final   Streptococcus species NOT DETECTED NOT DETECTED Final   Streptococcus agalactiae NOT DETECTED NOT DETECTED Final   Streptococcus pneumoniae NOT DETECTED NOT DETECTED Final   Streptococcus pyogenes NOT DETECTED NOT DETECTED Final   Acinetobacter baumannii NOT DETECTED NOT DETECTED Final   Enterobacteriaceae species DETECTED (A) NOT DETECTED Final    Comment: CRITICAL RESULT CALLED TO, READ BACK BY AND VERIFIED WITH: NATE COOKSON ON 05/13/16 AT 0257 QSD    Enterobacter cloacae complex NOT DETECTED NOT DETECTED Final   Escherichia coli DETECTED (A) NOT DETECTED Final    Comment: CRITICAL RESULT CALLED TO, READ BACK BY AND VERIFIED WITH: NATE COOKSON ON 05/13/16 AT 0257 QSD    Klebsiella oxytoca NOT DETECTED NOT DETECTED Final   Klebsiella pneumoniae NOT DETECTED NOT DETECTED Final   Proteus species NOT DETECTED NOT DETECTED Final   Serratia marcescens NOT DETECTED NOT DETECTED Final   Carbapenem resistance NOT DETECTED  NOT DETECTED Final   Haemophilus influenzae NOT DETECTED NOT DETECTED  Final   Neisseria meningitidis NOT DETECTED NOT DETECTED Final   Pseudomonas aeruginosa NOT DETECTED NOT DETECTED Final   Candida albicans NOT DETECTED NOT DETECTED Final   Candida glabrata NOT DETECTED NOT DETECTED Final   Candida krusei NOT DETECTED NOT DETECTED Final   Candida parapsilosis NOT DETECTED NOT DETECTED Final   Candida tropicalis NOT DETECTED NOT DETECTED Final  Urine culture     Status: Abnormal   Collection Time: 05/12/16 11:45 AM  Result Value Ref Range Status   Specimen Description URINE, RANDOM  Final   Special Requests NONE  Final   Culture >=100,000 COLONIES/mL ESCHERICHIA COLI (A)  Final   Report Status 05/15/2016 FINAL  Final   Organism ID, Bacteria ESCHERICHIA COLI (A)  Final      Susceptibility   Escherichia coli - MIC*    AMPICILLIN >=32 RESISTANT Resistant     CEFAZOLIN <=4 SENSITIVE Sensitive     CEFTRIAXONE <=1 SENSITIVE Sensitive     CIPROFLOXACIN <=0.25 SENSITIVE Sensitive     GENTAMICIN <=1 SENSITIVE Sensitive     IMIPENEM <=0.25 SENSITIVE Sensitive     NITROFURANTOIN <=16 SENSITIVE Sensitive     TRIMETH/SULFA >=320 RESISTANT Resistant     AMPICILLIN/SULBACTAM 16 INTERMEDIATE Intermediate     PIP/TAZO <=4 SENSITIVE Sensitive     Extended ESBL NEGATIVE Sensitive     * >=100,000 COLONIES/mL ESCHERICHIA COLI  Culture, blood (Routine x 2)     Status: Abnormal   Collection Time: 05/12/16 12:03 PM  Result Value Ref Range Status   Specimen Description BLOOD FEMORAL  Final   Special Requests   Final    BOTTLES DRAWN AEROBIC AND ANAEROBIC AER ANA   Culture  Setup Time   Final    GRAM NEGATIVE RODS IN BOTH AEROBIC AND ANAEROBIC BOTTLES CRITICAL RESULT CALLED TO, READ BACK BY AND VERIFIED WITH: NATE COOKSON ON 05/13/16 AT 0257 QSD    Culture ESCHERICHIA COLI (A)  Final   Report Status 05/15/2016 FINAL  Final   Organism ID, Bacteria ESCHERICHIA COLI  Final       Susceptibility   Escherichia coli - MIC*    AMPICILLIN >=32 RESISTANT Resistant     CEFAZOLIN <=4 SENSITIVE Sensitive     CEFEPIME <=1 SENSITIVE Sensitive     CEFTAZIDIME <=1 SENSITIVE Sensitive     CEFTRIAXONE <=1 SENSITIVE Sensitive     CIPROFLOXACIN <=0.25 SENSITIVE Sensitive     GENTAMICIN <=1 SENSITIVE Sensitive     IMIPENEM <=0.25 SENSITIVE Sensitive     TRIMETH/SULFA >=320 RESISTANT Resistant     AMPICILLIN/SULBACTAM >=32 RESISTANT Resistant     PIP/TAZO <=4 SENSITIVE Sensitive     Extended ESBL NEGATIVE Sensitive     * ESCHERICHIA COLI  MRSA PCR Screening     Status: None   Collection Time: 05/13/16 12:04 AM  Result Value Ref Range Status   MRSA by PCR NEGATIVE NEGATIVE Final    Comment:        The GeneXpert MRSA Assay (FDA approved for NASAL specimens only), is one component of a comprehensive MRSA colonization surveillance program. It is not intended to diagnose MRSA infection nor to guide or monitor treatment for MRSA infections.     RADIOLOGY:  No results found.   Management plans discussed with the patient, family and they are in agreement.  CODE STATUS:     Code Status Orders        Start     Ordered   05/12/16 1610  Full code  Continuous     05/12/16 1609    Code Status History    Date Active Date Inactive Code Status Order ID Comments User Context   09/22/2015  9:26 PM 09/26/2015  6:18 PM Full Code [id number]  Enedina Finner, MD Inpatient   07/24/2015  2:18 AM 07/28/2015  6:12 PM Full Code [id number]  Ihor Austin, MD Inpatient   09/03/2014  1:01 PM 09/05/2014  6:09 PM Full Code [id number]  Enedina Finner, MD Inpatient    Advance Directive Documentation   Flowsheet Row Most Recent Value  Type of Advance Directive  Healthcare Power of Attorney, Living will  Pre-existing out of facility DNR order (yellow form or pink MOST form)  No data  "MOST" Form in Place?  No data      TOTAL TIME TAKING CARE OF THIS PATIENT: 40 minutes.    Kameisha Malicki M.D on 05/17/2016  at 8:16 AM  Between 7am to 6pm - Pager - [phone] After 6pm go to [email or web address] - password [email or web address]  [facility]  Office  [phone]  CC: Primary care physician; Sherlene Shams, MD

## 2016-05-17 NOTE — Progress Notes (Signed)
EMS here to transport pt. 

## 2016-06-12 IMAGING — MR MR HEAD W/O CM
10 series · 48 of 48 positions shown · non-contrast
Comparison: Head CT 07/23/2015 and MRI 03/15/2007

CLINICAL DATA: Altered mental status and weakness. Found on floor
at home 3 days ago.

EXAM:
MRI HEAD WITHOUT CONTRAST
TECHNIQUE: Multiplanar, multiecho pulse sequences of the brain and surrounding
structures were obtained without intravenous contrast.

[Series 2: GRE · sagittal · 5.0mm · 0.45mm/px · 5 of 27 slices shown (1 of 2)]
[im 1/27]
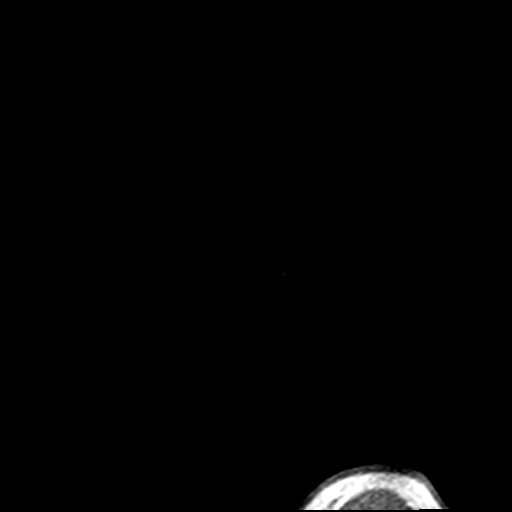
[im 7/27]
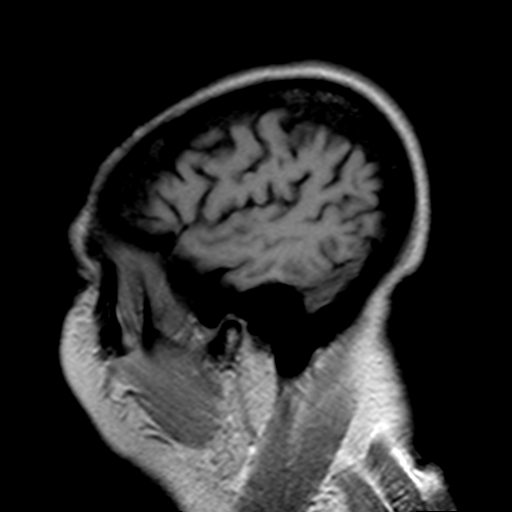
[im 14/27]
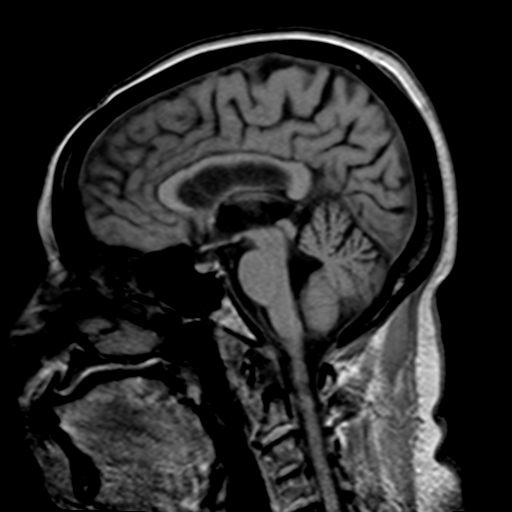
[im 20/27]
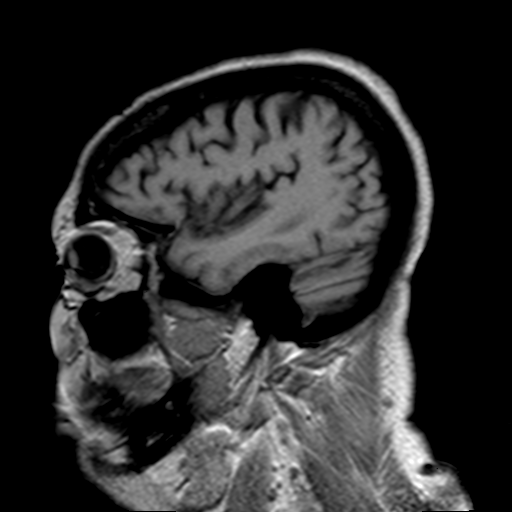
[im 27/27]
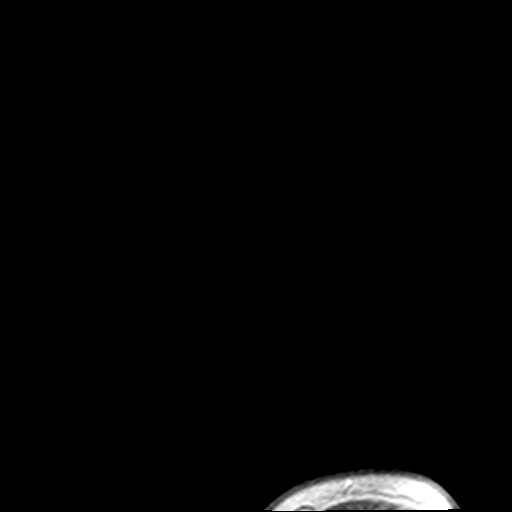

[Series 4: DWI · axial · 3.0mm · 1.80mm/px · z∈[-66,+92]mm · 8 of 54 slices shown (1 of 4)]
[im 1/54]
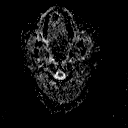
[im 8/54]
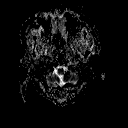
[im 16/54]
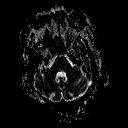
[im 23/54]
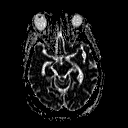
[im 31/54]
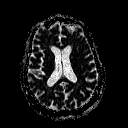
[im 38/54]
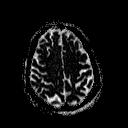
[im 46/54]
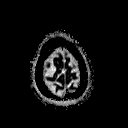
[im 54/54]
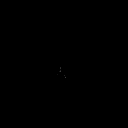

[Series 6: DWI · coronal · 3.0mm · 1.80mm/px · 6 of 49 slices shown (2 of 4)]
[im 1/49]
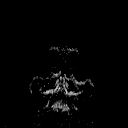
[im 10/49]
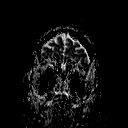
[im 20/49]
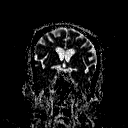
[im 29/49]
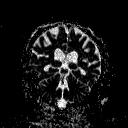
[im 39/49]
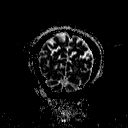
[im 49/49]
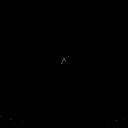

[Series 7: T2 · axial · 5.0mm · 0.45mm/px · z∈[-67,+95]mm · 3 of 26 slices shown (1 of 3)]
[im 1/26]
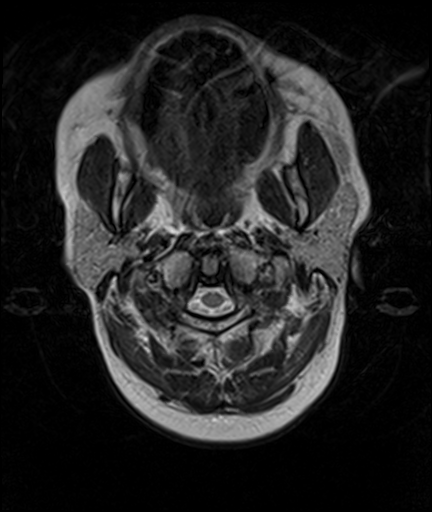
[im 13/26]
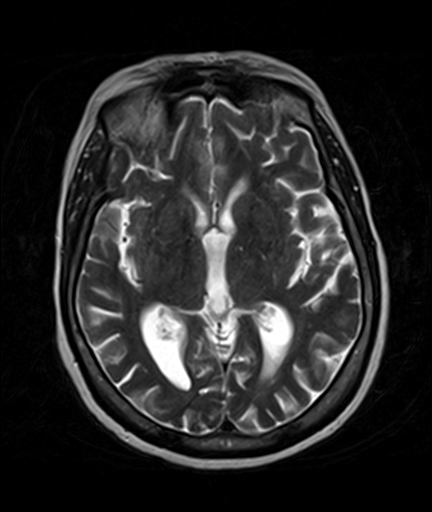
[im 26/26]
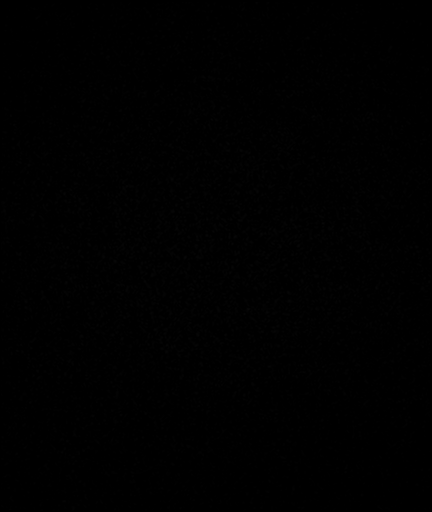

[Series 8: FLAIR · axial · 5.0mm · 0.45mm/px · z∈[-67,+95]mm · 3 of 26 slices shown]
[im 1/26]
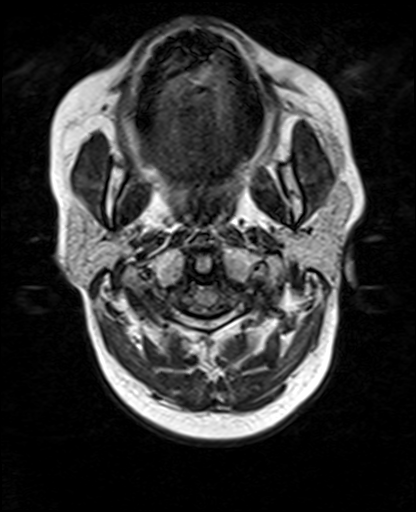
[im 13/26]
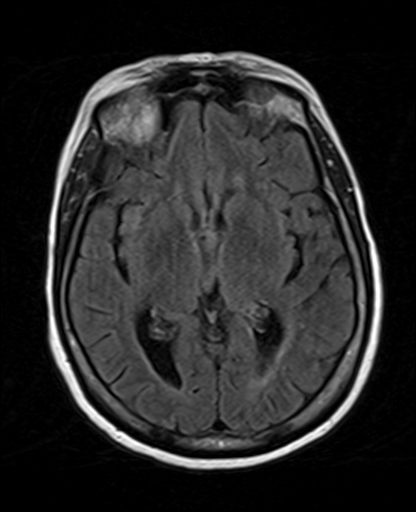
[im 26/26]
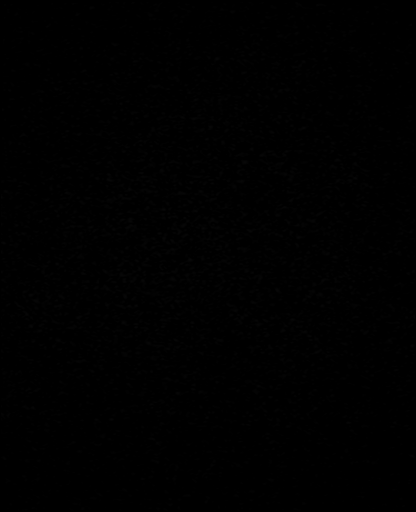

[Series 9: T2 · axial · 5.0mm · 1.20mm/px · z∈[-66,+96]mm · 3 of 26 slices shown (2 of 3)]
[im 1/26]
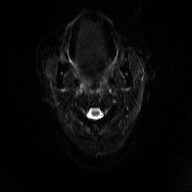
[im 13/26]
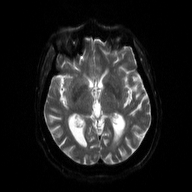
[im 26/26]
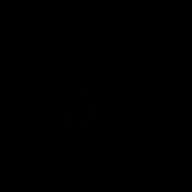

[Series 10: GRE · axial · 5.0mm · 0.45mm/px · z∈[-66,+96]mm · 3 of 26 slices shown (2 of 2)]
[im 1/26]
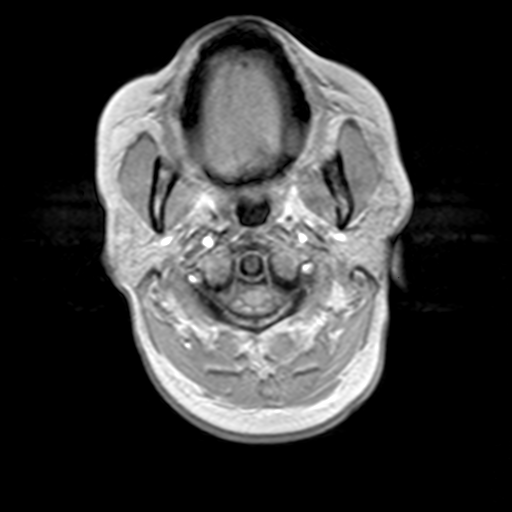
[im 13/26]
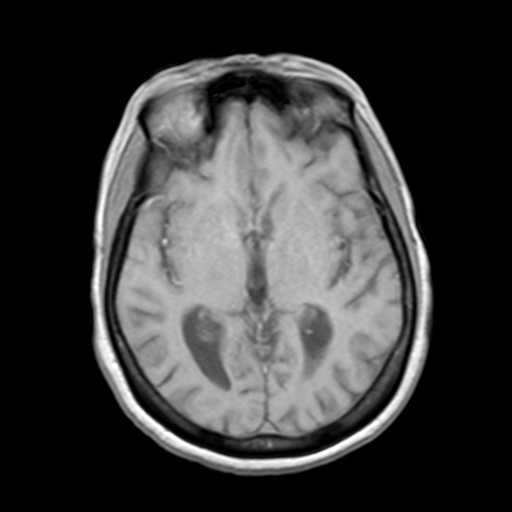
[im 26/26]
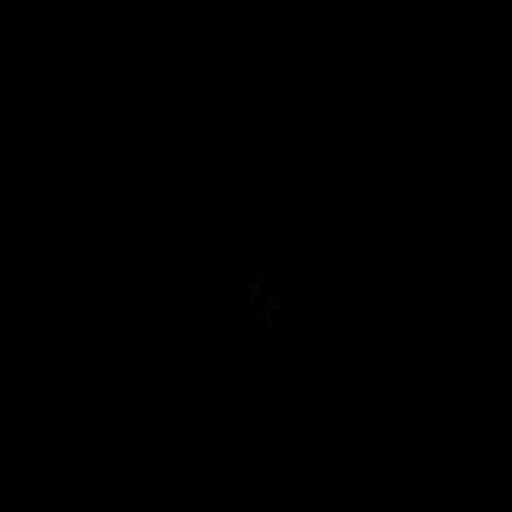

[Series 11: T2 · coronal · 5.0mm · 0.45mm/px · 4 of 31 slices shown (3 of 3)]
[im 1/31]
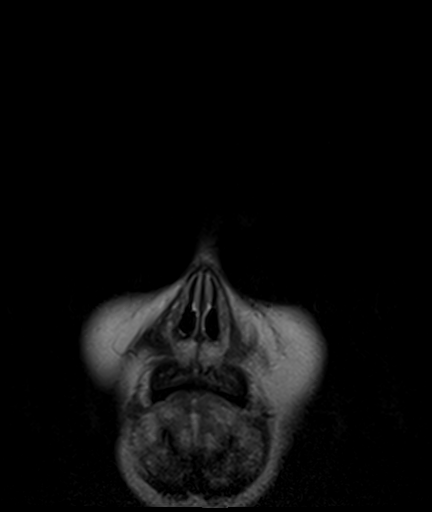
[im 11/31]
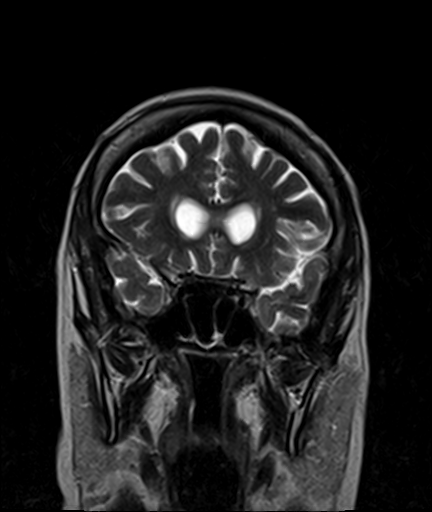
[im 21/31]
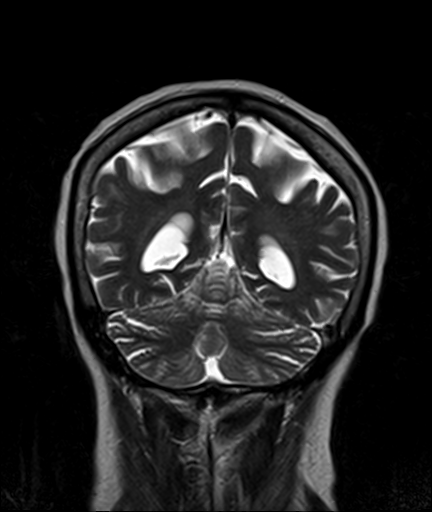
[im 31/31]
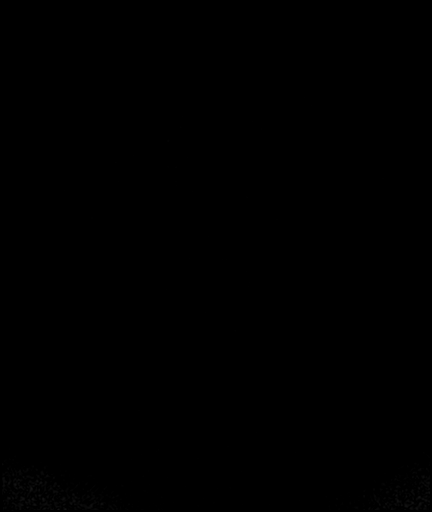

[Series 100: DWI · axial · 3.0mm · 1.80mm/px · z∈[-66,+95]mm · 7 of 55 slices shown (3 of 4)]
[im 1/55]
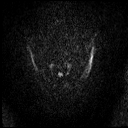
[im 10/55]
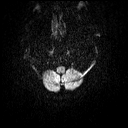
[im 19/55]
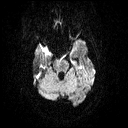
[im 28/55]
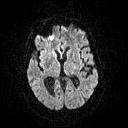
[im 37/55]
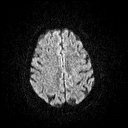
[im 46/55]
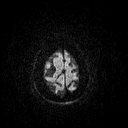
[im 55/55]
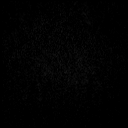

[Series 101: DWI · coronal · 3.0mm · 1.80mm/px · 6 of 49 slices shown (4 of 4)]
[im 1/49]
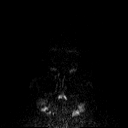
[im 10/49]
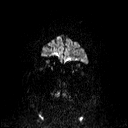
[im 20/49]
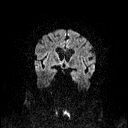
[im 29/49]
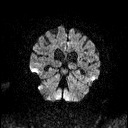
[im 39/49]
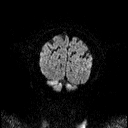
[im 49/49]
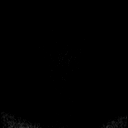

[48 of 48 positions shown; findings below may reference images not displayed]

FINDINGS: There is no evidence of acute infarct, intracranial hemorrhage,
mass, midline shift, or extra-axial fluid collection. Mild cerebral
atrophy is within normal limits for age. T2 hyperintensities in the
subcortical and deep cerebral white matter bilaterally are similar
to the prior MRI and nonspecific but compatible with mild chronic
small vessel ischemic disease.

Orbits are unremarkable. The paranasal sinuses are clear. There is a
trace right mastoid effusion. Major intracranial vascular flow voids
are preserved.
IMPRESSION: 1. No acute intracranial abnormality.
2. Mild chronic small vessel ischemic disease.

## 2016-08-18 ENCOUNTER — Encounter: Payer: Medicare (Managed Care) | Attending: Internal Medicine | Admitting: Internal Medicine

## 2016-08-18 DIAGNOSIS — E11621 Type 2 diabetes mellitus with foot ulcer: Secondary | ICD-10-CM | POA: Insufficient documentation

## 2016-08-18 DIAGNOSIS — N184 Chronic kidney disease, stage 4 (severe): Secondary | ICD-10-CM | POA: Insufficient documentation

## 2016-08-18 DIAGNOSIS — I130 Hypertensive heart and chronic kidney disease with heart failure and stage 1 through stage 4 chronic kidney disease, or unspecified chronic kidney disease: Secondary | ICD-10-CM | POA: Insufficient documentation

## 2016-08-18 DIAGNOSIS — E1122 Type 2 diabetes mellitus with diabetic chronic kidney disease: Secondary | ICD-10-CM | POA: Diagnosis not present

## 2016-08-18 DIAGNOSIS — D649 Anemia, unspecified: Secondary | ICD-10-CM | POA: Diagnosis not present

## 2016-08-18 DIAGNOSIS — L89613 Pressure ulcer of right heel, stage 3: Secondary | ICD-10-CM | POA: Insufficient documentation

## 2016-08-18 DIAGNOSIS — Z87891 Personal history of nicotine dependence: Secondary | ICD-10-CM | POA: Diagnosis not present

## 2016-08-18 DIAGNOSIS — M109 Gout, unspecified: Secondary | ICD-10-CM | POA: Diagnosis not present

## 2016-08-18 DIAGNOSIS — F0280 Dementia in other diseases classified elsewhere without behavioral disturbance: Secondary | ICD-10-CM | POA: Insufficient documentation

## 2016-08-18 DIAGNOSIS — E1140 Type 2 diabetes mellitus with diabetic neuropathy, unspecified: Secondary | ICD-10-CM | POA: Diagnosis not present

## 2016-08-18 DIAGNOSIS — L89623 Pressure ulcer of left heel, stage 3: Secondary | ICD-10-CM | POA: Diagnosis not present

## 2016-08-18 DIAGNOSIS — G309 Alzheimer's disease, unspecified: Secondary | ICD-10-CM | POA: Insufficient documentation

## 2016-08-18 DIAGNOSIS — I509 Heart failure, unspecified: Secondary | ICD-10-CM | POA: Insufficient documentation

## 2016-08-19 NOTE — Progress Notes (Signed)
Ellawyn, Wogan Alanis ([id number]) Visit Report for 08/18/2016 Abuse/Suicide Risk Screen Details Patient Name: [name], [name] Date of Service: 08/18/2016 8:00 AM Medical Record Patient Account Number: [id number] Number: Treating RN: Curtis Sites 06/21/1935 (80 y.o. Other Clinician: Date of Birth/Sex: Female) Treating ROBSON, [name] Primary Care Lamae Fosco: Duncan Dull Evadna Donaghy/Extender: G Referring Nariya Neumeyer: Ardis Rowan in Treatment: 0 Abuse/Suicide Risk Screen Items Answer ABUSE/SUICIDE RISK SCREEN: Has anyone close to you tried to hurt or harm you recentlyo No Do you feel uncomfortable with anyone in your familyo No Has anyone forced you do things that you didnot want to doo No Do you have any thoughts of harming yourselfo No Patient displays signs or symptoms of abuse and/or neglect. No Electronic Signature(s) Signed: 08/18/2016 5:41:54 PM By: Curtis Sites Entered By: Curtis Sites on 08/18/2016 08:27:30 Hosek, Oluwatobi ([id number]) -------------------------------------------------------------------------------- Activities of Daily Living Details Patient Name: [name], [name] Date of Service: 08/18/2016 8:00 AM Medical Record Patient Account Number: [id number] Number: Treating RN: Curtis Sites [birth year] (80 y.o. Other Clinician: Date of Birth/Sex: Female) Treating ROBSON, [name] Primary Care Destan Franchini: Duncan Dull Marlea Gambill/Extender: G Referring Chanta Bauers: Ardis Rowan in Treatment: 0 Activities of Daily Living Items Answer Activities of Daily Living (Please select one for each item) Drive Automobile Not Able Take Medications Not Able Use Telephone Need Assistance Care for Appearance Need Assistance Use Toilet Need Assistance Bath / Shower Not Able Dress Self Not Able Feed Self Completely Able Walk Need Assistance Get In / Out Bed Need Assistance Housework Not Able Prepare Meals Not Able Handle Money Not Able Shop for  Self Not Able Electronic Signature(s) Signed: 08/18/2016 5:41:54 PM By: Curtis Sites Entered By: Curtis Sites on 08/18/2016 08:28:11 Bowdish, Janera ([id number]) -------------------------------------------------------------------------------- Education Assessment Details Patient Name: [name] Date of Service: 08/18/2016 8:00 AM Medical Record Patient Account Number: [id number] Number: Treating RN: Curtis Sites November 05, 1935 (80 y.o. Other Clinician: Date of Birth/Sex: Female) Treating ROBSON, [name] Primary Care Kataleyah Carducci: Duncan Dull Prajna Vanderpool/Extender: G Referring Preet Mangano: Ardis Rowan in Treatment: 0 Primary Learner Assessed: Caregiver daughter Learning Preferences/Education Level/Primary Language Learning Preference: Explanation Preferred Language: English Cognitive Barrier Assessment/Beliefs Language Barrier: No Translator Needed: No Memory Deficit: No Emotional Barrier: No Cultural/Religious Beliefs Affecting Medical No Care: Physical Barrier Assessment Impaired Vision: Yes cateracts Impaired Hearing: Yes HOH Decreased Hand dexterity: No Knowledge/Comprehension Assessment Knowledge Level: Medium Comprehension Level: Medium Ability to understand written Medium instructions: Ability to understand verbal Medium instructions: Motivation Assessment Anxiety Level: Calm Cooperation: Cooperative Education Importance: Acknowledges Need Interest in Health Problems: Asks Questions Perception: Coherent Willingness to Engage in Self- Medium Management Activities: Readiness to Engage in Self- Medium Management Activities: [place], Guinevere ([id number]) Electronic Signature(s) Signed: 08/18/2016 5:41:54 PM By: Curtis Sites Entered By: Curtis Sites on 08/18/2016 08:28:49 Markovitz, Chava ([id number]) -------------------------------------------------------------------------------- Fall Risk Assessment Details Patient Name: [name] Date of Service: 08/18/2016 8:00 AM Medical Record Patient Account Number: [id number] Number: Treating RN: Curtis Sites 09/23/1935 (80 y.o. Other Clinician: Date of Birth/Sex: Female) Treating ROBSON, [name] Primary Care Bhavesh Vazquez: Duncan Dull Tameika Heckmann/Extender: G Referring Eara Burruel: Ardis Rowan in Treatment: 0 Fall Risk Assessment Items Have you had 2 or more falls in the last 12 monthso 0 No Have you had any fall that resulted in injury in the last 12 monthso 0 No FALL RISK ASSESSMENT: History of falling - immediate or within 3 months 0 No Secondary diagnosis 15 Yes Ambulatory aid None/bed rest/wheelchair/nurse 0 No Crutches/cane/walker 15 Yes Furniture 0 No IV Access/Saline Lock 0 No Gait/Training Normal/bed  rest/immobile 0 No Weak 10 Yes Impaired 20 Yes Mental Status Oriented to own ability 0 No Electronic Signature(s) Signed: 08/18/2016 5:41:54 PM By: Curtis Sites Entered By: Curtis Sites on 08/18/2016 08:29:18 Trebilcock, Porshe ([id number]) -------------------------------------------------------------------------------- Foot Assessment Details Patient Name: [name] Date of Service: 08/18/2016 8:00 AM Medical Record Patient Account Number: [id number] Number: Treating RN: Curtis Sites 1936-01-29 (80 y.o. Other Clinician: Date of Birth/Sex: Female) Treating ROBSON, [name] Primary Care Eltha Tingley: Duncan Dull Keyshaun Exley/Extender: G Referring Cabela Pacifico: Ardis Rowan in Treatment: 0 Foot Assessment Items Site Locations + = Sensation present, - = Sensation absent, C = Callus, U = Ulcer R = Redness, W = Warmth, M = Maceration, PU = Pre-ulcerative lesion F = Fissure, S = Swelling, D = Dryness Assessment Right: Left: Other Deformity: No No Prior Foot Ulcer: No No Prior Amputation: No No Charcot Joint: No No Ambulatory Status: Ambulatory With Help Assistance Device: Walker Gait: Steady Electronic  Signature(s) Signed: 08/18/2016 5:41:54 PM By: Curtis Sites Entered By: Curtis Sites on 08/18/2016 08:31:23 Stepp, Hibah ([id number]) [place], Aundreya ([id number]) -------------------------------------------------------------------------------- Nutrition Risk Assessment Details Patient Name: [name] Date of Service: 08/18/2016 8:00 AM Medical Record Patient Account Number: [id number] Number: Treating RN: Curtis Sites 1936/04/10 (80 y.o. Other Clinician: Date of Birth/Sex: Female) Treating ROBSON, [name] Primary Care Diedra Sinor: Duncan Dull Tallan Sandoz/Extender: G Referring Kylinn Shropshire: Ardis Rowan in Treatment: 0 Height (in): 66 Weight (lbs): 140.8 Body Mass Index (BMI): 22.7 Nutrition Risk Assessment Items NUTRITION RISK SCREEN: I have an illness or condition that made me change the kind and/or 2 Yes amount of food I eat I eat fewer than two meals per day 3 Yes I eat few fruits and vegetables, or milk products 0 No I have three or more drinks of beer, liquor or wine almost every day 0 No I have tooth or mouth problems that make it hard for me to eat 0 No I don't always have enough money to buy the food I need 0 No I eat alone most of the time 0 No I take three or more different prescribed or over-the-counter drugs a 1 Yes day Without wanting to, I have lost or gained 10 pounds in the last six 0 No months I am not always physically able to shop, cook and/or feed myself 0 No Nutrition Protocols Good Risk Protocol Moderate Risk Protocol Electronic Signature(s) Signed: 08/18/2016 5:41:54 PM By: Curtis Sites Entered By: Curtis Sites on 08/18/2016 08:29:52

## 2016-08-19 NOTE — Progress Notes (Signed)
[name], Teaney [name] ([id number]) Visit Report for 08/18/2016 Chief Complaint Document Details Patient Name: [name], [name] Date of Service: 08/18/2016 8:00 AM Medical Record Patient Account Number: [id number] Number: Treating RN: [birth year] (81 y.o. Other Clinician: Date of Birth/Sex: Female) Treating [name] Primary Care Provider: Duncan [name] Provider/Extender: G Referring Provider: Ardis [name] in Treatment: 0 Information Obtained from: Patient Chief Complaint 08/18/16; patient is here for review of bilateral heel ulcers Electronic Signature(s) Signed: 08/18/2016 4:33:23 PM By: [name] Najjar MD Entered By: [name] on 08/18/2016 09:17:11 [name], [name] ([id number]) -------------------------------------------------------------------------------- Debridement Details Patient Name: [name] Date of Service: 08/18/2016 8:00 AM Medical Record Patient Account Number: [id number] Number: Treating RN: 02/07/1936 (81 y.o. Other Clinician: Date of Birth/Sex: Female) Treating [name] Primary Care Provider: Duncan [name] Provider/Extender: G Referring Provider: Ardis [name] in Treatment: 0 Debridement Performed for Wound #1 Right Calcaneus Assessment: Performed By: Physician [name] Caul, MD Debridement: Debridement Pre-procedure Yes - 08:52 Verification/Time Out Taken: Start Time: 08:52 Pain Control: Lidocaine 4% Topical Solution Level: Skin/Subcutaneous Tissue/Muscle Total Area Debrided (L x 2.5 (cm) x 3.6 (cm) = 9 (cm) W): Tissue and other Viable, Non-Viable, Eschar, Fibrin/Slough, Muscle, Subcutaneous material debrided: Instrument: Blade, Curette, Forceps Bleeding: Moderate Hemostasis Achieved: Pressure End Time: 08:52 Procedural Pain: 0 Post Procedural Pain: 0 Response to Treatment: Procedure was tolerated well Post Debridement Measurements of Total Wound Length: (cm) 2.5 Stage: Category/Stage  III Width: (cm) 3.6 Depth: (cm) 0.3 Volume: (cm) 2.121 Character of Wound/Ulcer Post Improved Debridement: Severity of Tissue Post Necrosis of muscle Debridement: Post Procedure Diagnosis Same as Pre-procedure Electronic Signature(s) Signed: 08/18/2016 4:33:23 PM By: [name] Najjar MD [name] Verkin, [name] ([id number]) Entered By: [name] on 08/18/2016 09:16:29 [name], [name] ([id number]) -------------------------------------------------------------------------------- Debridement Details Patient Name: [name] Date of Service: 08/18/2016 8:00 AM Medical Record Patient Account Number: [id number] Number: Treating RN: Jan 12, 1936 (81 y.o. Other Clinician: Date of Birth/Sex: Female) Treating [name] Primary Care Provider: Duncan [name] Provider/Extender: G Referring Provider: Ardis [name] in Treatment: 0 Debridement Performed for Wound #2 Left Calcaneus Assessment: Performed By: Physician [name] Caul, MD Debridement: Debridement Pre-procedure Yes - 08:49 Verification/Time Out Taken: Start Time: 08:49 Pain Control: Lidocaine 4% Topical Solution Level: Skin/Subcutaneous Tissue/Muscle Total Area Debrided (L x 4 (cm) x 3.5 (cm) = 14 (cm) W): Tissue and other Viable, Non-Viable, Eschar, Fibrin/Slough, Muscle, Subcutaneous material debrided: Instrument: Blade, Curette, Forceps Bleeding: Moderate Hemostasis Achieved: Pressure End Time: 08:52 Procedural Pain: 0 Post Procedural Pain: 0 Response to Treatment: Procedure was tolerated well Post Debridement Measurements of Total Wound Length: (cm) 4 Stage: Category/Stage III Width: (cm) 3.5 Depth: (cm) 0.3 Volume: (cm) 3.299 Character of Wound/Ulcer Post Improved Debridement: Severity of Tissue Post Necrosis of muscle Debridement: Post Procedure Diagnosis Same as Pre-procedure Electronic Signature(s) Signed: 08/18/2016 4:33:23 PM By: [name] Najjar MD [name], [name]  ([id number]) Entered By: [name] on 08/18/2016 09:16:38 [name], [name] ([id number]) -------------------------------------------------------------------------------- HPI Details Patient Name: [name] Date of Service: 08/18/2016 8:00 AM Medical Record Patient Account Number: [id number] Number: Treating RN: October 04, 1935 (81 y.o. Other Clinician: Date of Birth/Sex: Female) Treating [name] Primary Care Provider: Duncan [name] Provider/Extender: G Referring Provider: Ardis [name] in Treatment: 0 History of Present Illness HPI Description: 08/18/16; this is a 81 year old woman who is a type II diabetic not currently on any treatment. She is also listed as having Alzheimer's disease hypertension. She has a history of chronic venous insufficiency with leg wounds in the past but no history of wounds in  her feet. In terms of her diabetes at one point she was on insulin however she is no longer on any current treatment, her daughter who is providing most of the history is unaware of her hemoglobin A1c. She has stage IV chronic renal failure and follows with nephrology. The history provided by her daughter is that she has had a wound on the left heel perhaps dating back to last May/17. At that point she was in the [name] with [facility] issues and [facility]. This was treated with some form of foam dressing and may have actually healed however she was readmitted [name] in January with Escherichia coli sepsis felt to be secondary to a UTI. [facility] Apparently this timeframe both the left heel and right heel reopened or at least the daughter became aware that they were both open. The exact timeframe isn't really certain Her ABIs in this clinic were 1.08 on the right 1.25 on the left. Looking through [employer] doesn't really provide any useful information. She did have  venous ultrasounds in 2016 that did not show a DVT. I don't see a recent hemoglobin A1c Electronic Signature(s) Signed: 08/18/2016 4:33:23 PM By: [name] Najjar MD Entered By: [name] on 08/18/2016 09:21:12 [name], [name] -------------------------------------------------------------------------------- Physical Exam Details Patient Name: [name] Date of Service: 08/18/2016 8:00 AM Medical Record Patient Account Number: [id number] Number: Treating RN: [birth year] (81 y.o. Other Clinician: Date of Birth/Sex: Female) Treating Jhada Risk Primary Care Provider: Duncan [name] Provider/Extender: G Referring Provider: Ardis [name] in Treatment: 0 Constitutional Sitting or standing Blood Pressure is within target range for patient.. Patient is hypertensive.. Pulse regular and within target range for patient.Marland Kitchen Respirations regular, non-labored and within target range.. Patient's appearance is neat and clean. Appears in no acute distress. Well nourished and well developed.. Eyes Conjunctivae clear. No discharge.. Ears, Nose, Mouth, and Throat Upper dentures no oral lesions. Neck Neck supple and symmetrical. No masses or crepitus. Respiratory Respiratory effort is easy and symmetric bilaterally. Rate is normal at rest and on room air.. Bilateral breath sounds are clear and equal in all lobes with no wheezes, rales or rhonchi.. Cardiovascular Soft benign sounding midsystolic murmur. She looks euvolemic. Palpable bilaterally. Palpable bilaterally.. Edema present in both extremities. She has signs of chronic venous insufficiency. Gastrointestinal (GI) Abdomen is soft and non-distended without masses or tenderness. Bowel sounds active in all quadrants.. Genitourinary (GU) Bladder without fullness, masses or tenderness.. Lymphatic None palpable in the popliteal or inguinal area. Integumentary (Hair, Skin) No rashes  seen. Neurological Increased tone bilaterally with cogwheel rigidity in the upper extremities. Decreased vibration sense. Psychiatric Cognitive issues related to dementia and nothing else.. Notes Wound exam; the patient arrived with heels but not the plantar aspect. Wounds were in roughly the same condition bilaterally covered with loosely adherent black eschar. The eschar on both sides was removed with a #15 scalpel pickups. After this the wounds were aggressively debrided of necrotic unidentified tissue and then underneath this necrotic muscle. Fortunately we able to get down to what looked to be [name], [name] ([id number]) reasonable-looking wound beds especially on the right. The left still had necrotic tissue after is much debridement as the patient was felt able to stand. Hemostasis with direct pressure. There was no evidence of infection bilaterally Electronic Signature(s) Signed: 08/18/2016 4:33:23 PM By: [name] Najjar MD Entered By: [name] on 08/18/2016 09:24:06 [name], [name] ([id number]) -------------------------------------------------------------------------------- Physician Orders Details Patient  Name: [name], [name] Date of Service: 08/18/2016 8:00 AM Medical Record Patient Account Number: [id number] Number: Treating RN: Curtis Sites [birth year] (80 y.o. Other Clinician: Date of Birth/Sex: Female) Treating Huey Scalia Primary Care Provider: Duncan [name] Provider/Extender: G Referring Provider: Ardis [name] in Treatment: 0 Verbal / Phone Orders: No Diagnosis Coding Wound Cleansing Wound #1 Right Calcaneus o Clean wound with Normal Saline. o May Shower, gently pat wound dry prior to applying new dressing. Wound #2 Left Calcaneus o Clean wound with Normal Saline. o May Shower, gently pat wound dry prior to applying new dressing. Anesthetic Wound #1 Right Calcaneus o Topical Lidocaine 4% cream applied to wound bed  prior to debridement Wound #2 Left Calcaneus o Topical Lidocaine 4% cream applied to wound bed prior to debridement Primary Wound Dressing Wound #1 Right Calcaneus o Santyl Ointment Wound #2 Left Calcaneus o Santyl Ointment Secondary Dressing Wound #1 Right Calcaneus o Gauze and Kerlix/Conform o Foam - heel cups Wound #2 Left Calcaneus o Gauze and Kerlix/Conform o Foam - heel cups Dressing Change Frequency Wound #1 Right Calcaneus Benedick, Kariann ([id number]) o Change dressing every day. Wound #2 Left Calcaneus o Change dressing every day. Follow-up Appointments Wound #1 Right Calcaneus o Return Appointment in 1 week. Wound #2 Left Calcaneus o Return Appointment in 1 week. Edema Control Wound #1 Right Calcaneus o Elevate legs to the level of the heart and pump ankles as often as possible Wound #2 Left Calcaneus o Elevate legs to the level of the heart and pump ankles as often as possible Off-Loading Wound #1 Right Calcaneus o Turn and reposition every 2 hours o Other: - wear heel protector boots at al times in bed, float heels while in bed, Wound #2 Left Calcaneus o Turn and reposition every 2 hours o Other: - wear heel protector boots at al times in bed, float heels while in bed, Additional Orders / Instructions Wound #1 Right Calcaneus o Increase protein intake. o Other: - please add vitamin A, vitamin C and zinc supplements to your diet Wound #2 Left Calcaneus o Increase protein intake. o Other: - please add vitamin A, vitamin C and zinc supplements to your diet Radiology o X-ray, heel - SNF to arrange mobile xrays - bilateral heels and fax results to [facility] at [phone] Electronic Signature(s) Signed: 08/18/2016 4:33:23 PM By: [name] Najjar MD Signed: 08/18/2016 5:41:54 PM By: Curtis Sites Entered By: Curtis Sites on 08/18/2016 09:00:02 [name], [name] ([id number]) [name], [name]  ([id number]) -------------------------------------------------------------------------------- Problem List Details Patient Name: [name] Date of Service: 08/18/2016 8:00 AM Medical Record Patient Account Number: [id number] Number: Treating RN: 06/18/1935 (81 y.o. Other Clinician: Date of Birth/Sex: Female) Treating Kaylan Yates Primary Care Provider: Duncan [name] Provider/Extender: G Referring Provider: Ardis [name] in Treatment: 0 Active Problems ICD-10 Encounter Code Description Active Date Diagnosis E11.621 Type 2 diabetes mellitus with foot ulcer 08/18/2016 Yes L89.623 Pressure ulcer of left heel, stage 3 08/18/2016 Yes L89.613 Pressure ulcer of right heel, stage 3 08/18/2016 Yes Inactive Problems Resolved Problems Electronic Signature(s) Signed: 08/18/2016 4:33:23 PM By: [name] Najjar MD Entered By: [name] on 08/18/2016 09:15:56 [name], [name] ([id number]) -------------------------------------------------------------------------------- Progress Note Details Patient Name: [name] Date of Service: 08/18/2016 8:00 AM Medical Record Patient Account Number: [id number] Number: Treating RN: 02/26/1936 (81 y.o. Other Clinician: Date of Birth/Sex: Female) Treating Rozelia Catapano Primary Care Provider: Duncan [name] Provider/Extender: G Referring Provider: Ardis [name] in Treatment: 0 Subjective Chief Complaint Information  obtained from Patient 08/18/16; patient is here for review of bilateral heel ulcers History of Present Illness (HPI) 08/18/16; this is a 81 year old woman who is a type II diabetic not currently on any treatment. She is also listed as having Alzheimer's disease hypertension. She has a history of chronic venous insufficiency with leg wounds in the past but no history of wounds in her feet. In terms of her diabetes at one point she was on insulin however she is no longer on any current  treatment, her daughter who is providing most of the history is unaware of her hemoglobin A1c. She has stage IV chronic renal failure and follows with nephrology. The history provided by her daughter is that she has had a wound on the left heel perhaps dating back to last May/17. At that point she was in [name] with [facility] issues and was [facility]. This was treated with some form of foam dressing and may have actually healed however she was readmitted [name] in January with Escherichia coli sepsis felt to be secondary to a UTI. Since then [facility]. Apparently this timeframe both the left heel and right heel reopened or at least the daughter became aware that they were both open. The exact timeframe isn't really certain Her ABIs in this clinic were 1.08 on the right 1.25 on the left. Looking through [employer] doesn't really provide any useful information. She did have venous ultrasounds in 2016 that did not show a DVT. I don't see a recent hemoglobin A1c Wound History Patient presents with 2 open wounds that have been present for approximately 07/10/2016. Patient has been treating wounds in the following manner: santyl. Laboratory tests have not been performed in the last month. Patient reportedly has not tested positive for an antibiotic resistant organism. Patient reportedly has not tested positive for osteomyelitis. Patient reportedly has had testing performed to evaluate circulation in the legs. Patient experiences the following problems associated with their wounds: swelling. Patient History Information obtained from Patient, Caregiver. Allergies hydrocodone, morphine [name], [name] Family History Diabetes - Siblings, Mother, Heart Disease - Father, Mother. Social History Former smoker - quit 2000, Marital Status - Divorced, Alcohol Use - Never, Drug Use - No History, Caffeine Use -  Rarely. Medical History Eyes Patient has history of Cataracts Hematologic/Lymphatic Patient has history of Anemia Cardiovascular Patient has history of Congestive Heart Failure, Hypertension Endocrine Patient has history of Type II Diabetes Musculoskeletal Patient has history of Gout Neurologic Patient has history of Dementia, Neuropathy Blood sugar is not tested. Review of Systems (ROS) Constitutional Symptoms (General Health) The patient has no complaints or symptoms. Ear/Nose/Mouth/Throat The patient has no complaints or symptoms. Respiratory Complains or has symptoms of Shortness of Breath - SOB with exertion. Gastrointestinal GERD Hemorrhoids Endocrine Complains or has symptoms of Thyroid disease. Genitourinary Renal disorder ARF Immunological The patient has no complaints or symptoms. Integumentary (Skin) Complains or has symptoms of Wounds. Oncologic The patient has no complaints or symptoms. Psychiatric The patient has no complaints or symptoms. [name], Indica ([id number]) Objective Constitutional Sitting or standing Blood Pressure is within target range for patient.. Patient is hypertensive.. Pulse regular and within target range for patient.Marland Kitchen Respirations regular, non-labored and within target range.. Patient's appearance is neat and clean. Appears in no acute distress. Well nourished and well developed.. Vitals Time Taken: 8:15 AM, Height: 66 in, Source: Stated, Weight: 140.8 lbs, Source: Measured, BMI: 22.7, Temperature: 97.7 F, Pulse: 79 bpm,  Respiratory Rate: 16 breaths/min, Blood Pressure: 136/38 mmHg. Eyes Conjunctivae clear. No discharge.. Ears, Nose, Mouth, and Throat Upper dentures no oral lesions. Neck Neck supple and symmetrical. No masses or crepitus. Respiratory Respiratory effort is easy and symmetric bilaterally. Rate is normal at rest and on room air.. Bilateral breath sounds are clear and equal in all lobes with no wheezes, rales or  rhonchi.. Cardiovascular Soft benign sounding midsystolic murmur. She looks euvolemic. Palpable bilaterally. Palpable bilaterally.. Edema present in both extremities. She has signs of chronic venous insufficiency. Gastrointestinal (GI) Abdomen is soft and non-distended without masses or tenderness. Bowel sounds active in all quadrants.. Genitourinary (GU) Bladder without fullness, masses or tenderness.. Lymphatic None palpable in the popliteal or inguinal area. Neurological Increased tone bilaterally with cogwheel rigidity in the upper extremities. Decreased vibration sense. Psychiatric Cognitive issues related to dementia and nothing else.. General Notes: Wound exam; the patient arrived with heels but not the plantar aspect. Wounds were in roughly the same condition bilaterally covered with loosely adherent black eschar. The eschar on both sides was removed with a #15 scalpel pickups. After this the wounds were aggressively debrided of necrotic unidentified tissue and then underneath this necrotic muscle. Fortunately we able to get down to what [name], [name] looked to be reasonable-looking wound beds especially on the right. The left still had necrotic tissue after is much debridement as the patient was felt able to stand. Hemostasis with direct pressure. There was no evidence of infection bilaterally Integumentary (Hair, Skin) No rashes seen. Wound #1 status is Open. Original cause of wound was Pressure Injury. The wound is located on the Right Calcaneus. The wound measures 2.5cm length x 3.6cm width x 0.2cm depth; 7.069cm^2 area and 1.414cm^3 volume. The wound is limited to skin breakdown. There is no tunneling or undermining noted. There is a large amount of serous drainage noted. The wound margin is flat and intact. There is small (1-33%) pink granulation within the wound bed. There is a large (67-100%) amount of necrotic tissue within the wound bed including Eschar and  Adherent Slough. The periwound skin appearance did not exhibit: Callus, Crepitus, Excoriation, Induration, Rash, Scarring, Dry/Scaly, Maceration, Atrophie Blanche, Cyanosis, Ecchymosis, Hemosiderin Staining, Mottled, Pallor, Rubor, Erythema. Periwound temperature was noted as No Abnormality. The periwound has tenderness on palpation. Wound #2 status is Open. Original cause of wound was Pressure Injury. The wound is located on the Left Calcaneus. The wound measures 4cm length x 3.5cm width x 0.2cm depth; 10.996cm^2 area and 2.199cm^3 volume. The wound is limited to skin breakdown. There is no tunneling or undermining noted. There is a large amount of serous drainage noted. The wound margin is flat and intact. There is small (1-33%) pink granulation within the wound bed. There is a large (67-100%) amount of necrotic tissue within the wound bed including Eschar and Adherent Slough. The periwound skin appearance did not exhibit: Callus, Crepitus, Excoriation, Induration, Rash, Scarring, Dry/Scaly, Maceration, Atrophie Blanche, Cyanosis, Ecchymosis, Hemosiderin Staining, Mottled, Pallor, Rubor, Erythema. Periwound temperature was noted as No Abnormality. The periwound has tenderness on palpation. Assessment Active Problems ICD-10 E11.621 - Type 2 diabetes mellitus with foot ulcer L89.623 - Pressure ulcer of left heel, stage 3 L89.613 - Pressure ulcer of right heel, stage 3 Procedures Wound #1 Wound #1 is a Pressure Ulcer located on the Right Calcaneus . There was a Skin/Subcutaneous Tissue/Muscle Debridement ([id number]) debridement with total area of 9 sq cm performed by [name] Caul, MD. with the following instrument(s): Blade, Curette, and  Forceps to remove Viable and Non- Regan, Celisa ([id number]) Viable tissue/material including Fibrin/Slough, Muscle, Eschar, and Subcutaneous after achieving pain control using Lidocaine 4% Topical Solution. A time out was conducted at 08:52, prior to  the start of the procedure. A Moderate amount of bleeding was controlled with Pressure. The procedure was tolerated well with a pain level of 0 throughout and a pain level of 0 following the procedure. Post Debridement Measurements: 2.5cm length x 3.6cm width x 0.3cm depth; 2.121cm^3 volume. Post debridement Stage noted as Category/Stage III. Character of Wound/Ulcer Post Debridement is improved. Severity of Tissue Post Debridement is: Necrosis of muscle. Post procedure Diagnosis Wound #1: Same as Pre-Procedure Wound #2 Wound #2 is a Pressure Ulcer located on the Left Calcaneus . There was a Skin/Subcutaneous Tissue/Muscle Debridement ([id number]) debridement with total area of 14 sq cm performed by [name] Caul, MD. with the following instrument(s): Blade, Curette, and Forceps to remove Viable and Non-Viable tissue/material including Fibrin/Slough, Muscle, Eschar, and Subcutaneous after achieving pain control using Lidocaine 4% Topical Solution. A time out was conducted at 08:49, prior to the start of the procedure. A Moderate amount of bleeding was controlled with Pressure. The procedure was tolerated well with a pain level of 0 throughout and a pain level of 0 following the procedure. Post Debridement Measurements: 4cm length x 3.5cm width x 0.3cm depth; 3.299cm^3 volume. Post debridement Stage noted as Category/Stage III. Character of Wound/Ulcer Post Debridement is improved. Severity of Tissue Post Debridement is: Necrosis of muscle. Post procedure Diagnosis Wound #2: Same as Pre-Procedure Plan Wound Cleansing: Wound #1 Right Calcaneus: Clean wound with Normal Saline. May Shower, gently pat wound dry prior to applying new dressing. Wound #2 Left Calcaneus: Clean wound with Normal Saline. May Shower, gently pat wound dry prior to applying new dressing. Anesthetic: Wound #1 Right Calcaneus: Topical Lidocaine 4% cream applied to wound bed prior to debridement Wound #2 Left  Calcaneus: Topical Lidocaine 4% cream applied to wound bed prior to debridement Primary Wound Dressing: Wound #1 Right Calcaneus: Santyl Ointment Wound #2 Left Calcaneus: Santyl Ointment Secondary Dressing: Kyne, Chihiro ([id number]) Wound #1 Right Calcaneus: Gauze and Kerlix/Conform Foam - heel cups Wound #2 Left Calcaneus: Gauze and Kerlix/Conform Foam - heel cups Dressing Change Frequency: Wound #1 Right Calcaneus: Change dressing every day. Wound #2 Left Calcaneus: Change dressing every day. Follow-up Appointments: Wound #1 Right Calcaneus: Return Appointment in 1 week. Wound #2 Left Calcaneus: Return Appointment in 1 week. Edema Control: Wound #1 Right Calcaneus: Elevate legs to the level of the heart and pump ankles as often as possible Wound #2 Left Calcaneus: Elevate legs to the level of the heart and pump ankles as often as possible Off-Loading: Wound #1 Right Calcaneus: Turn and reposition every 2 hours Other: - wear heel protector boots at al times in bed, float heels while in bed, Wound #2 Left Calcaneus: Turn and reposition every 2 hours Other: - wear heel protector boots at al times in bed, float heels while in bed, Additional Orders / Instructions: Wound #1 Right Calcaneus: Increase protein intake. Other: - please add vitamin A, vitamin C and zinc supplements to your diet Wound #2 Left Calcaneus: Increase protein intake. Other: - please add vitamin A, vitamin C and zinc supplements to your diet Radiology ordered were: X-ray, heel - SNF to arrange mobile xrays - bilateral heels and fax results to [name] at [phone] #1 Melburn Popper is the correct dressing, border foam Kerlix change every 2  days #2 pressure-relief is paramount here she apparently has bunny boots at night and uses open heeled shoes and I think wanders during the day. She probably wouldn't be safe in a more formal heel offloading boot although we can look at that  again. Debruyne, Sherece ([id number]) #3 she is likely to require more formal debridement #4 I've asked the nursing home to do x-rays of both heels exclude osteomyelitis #5 I don't believe she has significant PAD based on the ABIs in our clinic and bedside clinical exam although she does have mild neuropathy #6 has parkinsonism which could be secondary to the Risperdal she is on or part of her primary dementing process. I wonder if this can be tapered. She did at one point have psychosis as described by her daughter. #7 she definitely requires a hemoglobin A1c LC if I can see one in Epic otherwise I'll asked the nursing home to take care of this. I think these wounds are mostly pressure related rather than truly diabetic, it seems to fit best with a history Electronic Signature(s) Signed: 08/18/2016 4:33:23 PM By: [name] Najjar MD Entered By: [name] on 08/18/2016 09:28:47 Hennon, Mysha ([id number]) -------------------------------------------------------------------------------- ROS/PFSH Details Patient Name: [name] Date of Service: 08/18/2016 8:00 AM Medical Record Patient Account Number: [id number] Number: Treating RN: Curtis Sites May 23, 1935 (80 y.o. Other Clinician: Date of Birth/Sex: Female) Treating Priya Matsen Primary Care Provider: Duncan [name] Provider/Extender: G Referring Provider: Ardis [name] in Treatment: 0 Information Obtained From Patient Caregiver Wound History Do you currently have one or more open woundso Yes How many open wounds do you currently haveo 2 Approximately how long have you had your woundso 07/10/2016 How have you been treating your wound(s) until nowo santyl Has your wound(s) ever healed and then re-openedo No Have you had any lab work done in the past montho No Have you tested positive for an antibiotic resistant organism (MRSA, VRE)o No Have you tested positive for osteomyelitis (bone infection)o No Have  you had any tests for circulation on your legso Yes Who ordered the testo [facility] Have you had other problems associated with your woundso Swelling Respiratory Complaints and Symptoms: Positive for: Shortness of Breath - SOB with exertion Endocrine Complaints and Symptoms: Positive for: Thyroid disease Medical History: Positive for: Type II Diabetes Time with diabetes: 8 yrs Blood sugar tested every day: No Integumentary (Skin) Complaints and Symptoms: Positive for: Wounds Constitutional Symptoms (General Health) Saidi, Kyira ([id number]) Complaints and Symptoms: No Complaints or Symptoms Eyes Medical History: Positive for: Cataracts Ear/Nose/Mouth/Throat Complaints and Symptoms: No Complaints or Symptoms Hematologic/Lymphatic Medical History: Positive for: Anemia Cardiovascular Medical History: Positive for: Congestive Heart Failure; Hypertension Gastrointestinal Complaints and Symptoms: Review of System Notes: GERD Hemorrhoids Genitourinary Complaints and Symptoms: Review of System Notes: Renal disorder ARF Immunological Complaints and Symptoms: No Complaints or Symptoms Musculoskeletal Medical History: Positive for: Gout Neurologic Medical History: Positive for: Dementia; Neuropathy Jesus, Yaneliz ([id number]) Oncologic Complaints and Symptoms: No Complaints or Symptoms Psychiatric Complaints and Symptoms: No Complaints or Symptoms HBO Extended History Items Eyes: Cataracts Immunizations Pneumococcal Vaccine: Received Pneumococcal Vaccination: Yes Family and Social History Diabetes: Yes - Siblings, Mother; Heart Disease: Yes - Father, Mother; Former smoker - quit 2000; Marital Status - Divorced; Alcohol Use: Never; Drug Use: No History; Caffeine Use: Rarely; Financial Concerns: No; Food, Clothing or Shelter Needs: No; Support System Lacking: No; Transportation Concerns: No; Advanced Directives: Yes (Not Provided); Do not resuscitate: No; Living  Will: No; Medical Power of Attorney: Yes -  Carma Leaven (Not Provided) Electronic Signature(s) Signed: 08/18/2016 4:33:23 PM By: [name] Najjar MD Signed: 08/18/2016 5:41:54 PM By: Curtis Sites Entered By: Curtis Sites on 08/18/2016 08:27:22 Corbello, Camila ([id number]) -------------------------------------------------------------------------------- SuperBill Details Patient Name: [name] Date of Service: 08/18/2016 Medical Record Patient Account Number: [id number] Number: Treating RN: 23-Nov-1935 (81 y.o. Other Clinician: Date of Birth/Sex: Female) Treating Julicia Krieger Primary Care Provider: Duncan [name] Provider/Extender: G Referring Provider: Ardis [name] in Treatment: 0 Diagnosis Coding ICD-10 Codes Code Description E11.621 Type 2 diabetes mellitus with foot ulcer L89.623 Pressure ulcer of left heel, stage 3 L89.613 Pressure ulcer of right heel, stage 3 Facility Procedures CPT4 Code: [id number] Description: 99213 - WOUND CARE VISIT-LEV 3 EST PT Modifier: Quantity: 1 CPT4 Code: [id number] Description: 11043 - DEB MUSC/FASCIA 20 SQ CM/< ICD-10 Description Diagnosis L89.623 Pressure ulcer of left heel, stage 3 L89.613 Pressure ulcer of right heel, stage 3 Modifier: Quantity: 1 CPT4 Code: [id number] Description: 11046 - DEB MUSC/FASCIA EA ADDL 20 CM ICD-10 Description Diagnosis L89.613 Pressure ulcer of right heel, stage 3 L89.623 Pressure ulcer of left heel, stage 3 Modifier: Quantity: 1 Physician Procedures CPT4 Code: [id number] Description: 99204 - WC PHYS LEVEL 4 - NEW PT ICD-10 Description Diagnosis L89.613 Pressure ulcer of right heel, stage 3 L89.623 Pressure ulcer of left heel, stage 3 Modifier: 25 Quantity: 1 CPT4 Code: [id number] Tomson, MERTI Description: 11043 - WC PHYS DEBR MUSCLE/FASCIA 20 SQ CM ICD-10 Description Diagnosis S ([id number]) Modifier: Quantity: 1 Electronic Signature(s) Signed: 08/18/2016 4:33:23 PM By: [name] Najjar  MD Entered By: [name] on 08/18/2016 87:56:43

## 2016-08-19 NOTE — Progress Notes (Signed)
Mahnoor, Mathisen Prescilla ([id number]) Visit Report for 08/18/2016 Allergy List Details Patient Name: [name], [name] Date of Service: 08/18/2016 8:00 AM Medical Record Patient Account Number: [id number] Number: Treating RN: Curtis Sites Dec 25, 1935 (81 y.o. Other Clinician: Date of Birth/Sex: Female) Treating ROBSON, [name] Primary Care Jozlyn Schatz: Duncan Dull Holleigh Crihfield/Extender: G Referring Romolo Sieling: Einar Crow Weeks in Treatment: 0 Allergies Active Allergies hydrocodone morphine Allergy Notes Electronic Signature(s) Signed: 08/18/2016 5:41:54 PM By: Curtis Sites Entered By: Curtis Sites on 08/18/2016 08:18:58 Waters, Tiandra ([id number]) -------------------------------------------------------------------------------- Arrival Information Details Patient Name: [name] Date of Service: 08/18/2016 8:00 AM Medical Record Patient Account Number: [id number] Number: Treating RN: Curtis Sites Nov 11, 1935 (81 y.o. Other Clinician: Date of Birth/Sex: Female) Treating ROBSON, [name] Primary Care Marcques Wrightsman: Duncan Dull Legend Tumminello/Extender: G Referring Darel Ricketts: Ardis Rowan in Treatment: 0 Visit Information Patient Arrived: Walker Arrival Time: 08:09 Accompanied By: daughter Transfer Assistance: EasyPivot Patient Lift Patient Identification Verified: Yes Secondary Verification Process Yes Completed: Patient Requires Transmission- No Based Precautions: Patient Has Alerts: Yes Patient Alerts: DM II Electronic Signature(s) Signed: 08/18/2016 5:41:54 PM By: Curtis Sites Entered By: Curtis Sites on 08/18/2016 08:15:00 Wigger, Jesalyn ([id number]) -------------------------------------------------------------------------------- Clinic Level of Care Assessment Details Patient Name: [name] Date of Service: 08/18/2016 8:00 AM Medical Record Patient Account Number: [id number] Number: Treating RN: Curtis Sites [birth year]  (81 y.o. Other Clinician: Date of Birth/Sex: Female) Treating ROBSON, [name] Primary Care Remmington Urieta: Duncan Dull Doniqua Saxby/Extender: G Referring Mamye Bolds: Ardis Rowan in Treatment: 0 Clinic Level of Care Assessment Items TOOL 1 Quantity Score  - Use when EandM and Procedure is performed on INITIAL visit 0 ASSESSMENTS - Nursing Assessment / Reassessment X - General Physical Exam (combine w/ comprehensive assessment (listed just 1 20 below) when performed on new pt. evals) X - Comprehensive Assessment (HX, ROS, Risk Assessments, Wounds Hx, etc.) 1 25 ASSESSMENTS - Wound and Skin Assessment / Reassessment  - Dermatologic / Skin Assessment (not related to wound area) 0 ASSESSMENTS - Ostomy and/or Continence Assessment and Care  - Incontinence Assessment and Management 0  - Ostomy Care Assessment and Management (repouching, etc.) 0 PROCESS - Coordination of Care X - Simple Patient / Family Education for ongoing care 1 15  - Complex (extensive) Patient / Family Education for ongoing care 0 X - Staff obtains Consents, Records, Test Results / Process Orders 1 10  - Staff telephones HHA, Nursing Homes / Clarify orders / etc 0  - Routine Transfer to another Facility (non-emergent condition) 0  - Routine Hospital Admission (non-emergent condition) 0 X - New Admissions / Manufacturing engineer / Ordering NPWT, Apligraf, etc. 1 15  - Emergency Hospital Admission (emergent condition) 0 PROCESS - Special Needs  - Pediatric / Minor Patient Management 0 Wieber, Ysela ([id number])  - Isolation Patient Management 0  - Hearing / Language / Visual special needs 0  - Assessment of Community assistance (transportation, D/C planning, etc.) 0  - Additional assistance / Altered mentation 0  - Support Surface(s) Assessment (bed, cushion, seat, etc.) 0 INTERVENTIONS - Miscellaneous  - External ear exam 0  - Patient Transfer (multiple staff / Nurse, adult /  Similar devices) 0  - Simple Staple / Suture removal (25 or less) 0  - Complex Staple / Suture removal (26 or more) 0  - Hypo/Hyperglycemic Management (do not check if billed separately) 0 X - Ankle / Brachial Index (ABI) - do not check if billed separately 1 15 Has the patient been seen at the hospital within the last three years:  Yes Total Score: 100 Level Of Care: New/Established - Level 3 Electronic Signature(s) Signed: 08/18/2016 5:41:54 PM By: Curtis Sites Entered By: Curtis Sites on 08/18/2016 09:05:06 Gotcher, Brodi ([id number]) -------------------------------------------------------------------------------- Encounter Discharge Information Details Patient Name: [name] Date of Service: 08/18/2016 8:00 AM Medical Record Patient Account Number: [id number] Number: Treating RN: Curtis Sites 09-Apr-1936 (81 y.o. Other Clinician: Date of Birth/Sex: Female) Treating ROBSON, [name] Primary Care Cadyn Rodger: Duncan Dull Antinette Keough/Extender: G Referring Alee Gressman: Ardis Rowan in Treatment: 0 Encounter Discharge Information Items Discharge Pain Level: 0 Discharge Condition: Stable Ambulatory Status: Walker Discharge Destination: Nursing Home Transportation: Private Auto Accompanied By: dtr Schedule Follow-up Appointment: Yes Medication Reconciliation completed No and provided to Patient/Care Aviv Rota: Provided on Clinical Summary of Care: 08/18/2016 Form Type Recipient Paper Patient [facility] Electronic Signature(s) Signed: 08/18/2016 9:25:48 AM By: Gwenlyn Perking Entered By: Gwenlyn Perking on 08/18/2016 09:25:47 Desrosier, Anasia ([id number]) -------------------------------------------------------------------------------- General Visit Notes Details Patient Name: [name] Date of Service: 08/18/2016 8:00 AM Medical Record Patient Account Number: [id number] Number: Treating RN: Curtis Sites 10-Sep-1935 (81 y.o. Other  Clinician: Date of Birth/Sex: Female) Treating ROBSON, [name] Primary Care Naseem Varden: Duncan Dull Sangita Zani/Extender: G Referring Peniel Hass: Ardis Rowan in Treatment: 0 Notes CBG log given to patient and patient educated to record CBGs - log sent to SNF via patient envelope Electronic Signature(s) Signed: 08/19/2016 5:02:55 PM By: Curtis Sites Previous Signature: 08/19/2016 5:02:34 PM Version By: Curtis Sites Entered By: Curtis Sites on 08/19/2016 17:02:55 Finkel, Areyanna ([id number]) -------------------------------------------------------------------------------- Lower Extremity Assessment Details Patient Name: [name] Date of Service: 08/18/2016 8:00 AM Medical Record Patient Account Number: [id number] Number: Treating RN: Curtis Sites 1936-02-10 (80 y.o. Other Clinician: Date of Birth/Sex: Female) Treating ROBSON, [name] Primary Care Mohmed Farver: Duncan Dull Buffy Ehler/Extender: G Referring April Carlyon: Ardis Rowan in Treatment: 0 Edema Assessment Assessed: [Left: No] [Right: No] Edema: [Left: Yes] [Right: Yes] Calf Left: Right: Point of Measurement: 32 cm From Medial Instep 37.6 cm 35.6 cm Ankle Left: Right: Point of Measurement: 11 cm From Medial Instep 27.7 cm 28.1 cm Vascular Assessment Pulses: Dorsalis Pedis Palpable: [Left:Yes] [Right:Yes] Doppler Audible: [Left:Yes] Posterior Tibial Palpable: [Left:No] [Right:No] Doppler Audible: [Left:Yes] [Right:Yes] Extremity colors, hair growth, and conditions: Extremity Color: [Left:Hyperpigmented] [Right:Hyperpigmented] Hair Growth on Extremity: [Left:No] [Right:No] Temperature of Extremity: [Left:Cool] [Right:Cool] Capillary Refill: [Left:< 3 seconds] [Right:< 3 seconds] Blood Pressure: Brachial: [Left:126] [Right:126] Dorsalis Pedis: 158 [Left:Dorsalis Pedis: 132] Ankle: Posterior Tibial: 132 [Left:Posterior Tibial: 136 1.25] [Right:1.08] Toe Nail Assessment Left:  Right: Thick: Yes Yes Discolored: No No Firkus, Terisha ([id number]) Deformed: No No Improper Length and Hygiene: No No Electronic Signature(s) Signed: 08/18/2016 5:41:54 PM By: Curtis Sites Entered By: Curtis Sites on 08/18/2016 08:38:15 Atayde, Bellami ([id number]) -------------------------------------------------------------------------------- Multi Wound Chart Details Patient Name: [name] Date of Service: 08/18/2016 8:00 AM Medical Record Patient Account Number: [id number] Number: Treating RN: Curtis Sites [birth year] (80 y.o. Other Clinician: Date of Birth/Sex: Female) Treating ROBSON, [name] Primary Care Tadhg Eskew: Duncan Dull Nagee Goates/Extender: G Referring Alira Fretwell: Ardis Rowan in Treatment: 0 Vital Signs Height(in): 66 Pulse(bpm): 79 Weight(lbs): 140.8 Blood Pressure 136/38 (mmHg): Body Mass Index(BMI): 23 Temperature(F): 97.7 Respiratory Rate 16 (breaths/min): Photos: [1:No Photos] [2:No Photos] [N/A:N/A] Wound Location: [1:Right Calcaneus] [2:Left Calcaneus] [N/A:N/A] Wounding Event: [1:Pressure Injury] [2:Pressure Injury] [N/A:N/A] Primary Etiology: [1:Pressure Ulcer] [2:Pressure Ulcer] [N/A:N/A] Comorbid History: [1:Cataracts, Anemia, Congestive Heart Failure, Congestive Heart Failure, Hypertension, Type II Diabetes, Gout, Dementia, Diabetes, Gout, Dementia, Neuropathy] [2:Cataracts, Anemia, Hypertension, Type II Neuropathy] [N/A:N/A] Date Acquired: [1:07/05/2016] [2:07/05/2016] [N/A:N/A] Weeks  of Treatment: [1:0] [2:0] [N/A:N/A] Wound Status: [1:Open] [2:Open] [N/A:N/A] Measurements L x W x D 2.5x3.6x0.2 [2:4x3.5x0.2] [N/A:N/A] (cm) Area (cm) : [1:7.069] [2:10.996] [N/A:N/A] Volume (cm) : [1:1.414] [2:2.199] [N/A:N/A] Classification: [1:Category/Stage III] [2:Category/Stage III] [N/A:N/A] HBO Classification: [1:Grade 1] [2:Grade 1] [N/A:N/A] Exudate Amount: [1:Large] [2:Large] [N/A:N/A] Exudate Type: [1:Serous]  [2:Serous] [N/A:N/A] Exudate Color: [1:amber] [2:amber] [N/A:N/A] Foul Odor After [1:Yes] [2:Yes] [N/A:N/A] Cleansing: Odor Anticipated Due to No [2:No] [N/A:N/A] Product Use: Wound Margin: [1:Flat and Intact] [2:Flat and Intact] [N/A:N/A] Granulation Amount: [1:Small (1-33%)] [2:Small (1-33%)] [N/A:N/A] Granulation Quality: [1:Pink] [2:Pink] [N/A:N/A] Necrotic Amount: Large (67-100%) Large (67-100%) N/A Necrotic Tissue: Eschar, Adherent Slough Eschar, Adherent Slough N/A Exposed Structures: Fascia: No Fascia: No N/A Fat Layer (Subcutaneous Fat Layer (Subcutaneous Tissue) Exposed: No Tissue) Exposed: No Tendon: No Tendon: No Muscle: No Muscle: No Joint: No Joint: No Bone: No Bone: No Limited to Skin Limited to Skin Breakdown Breakdown Epithelialization: None None N/A Debridement: Debridement (16109- Debridement (60454- N/A 11047) 11047) Pre-procedure 08:52 08:49 N/A Verification/Time Out Taken: Pain Control: Lidocaine 4% Topical Lidocaine 4% Topical N/A Solution Solution Tissue Debrided: Necrotic/Eschar, Necrotic/Eschar, N/A Fibrin/Slough, Muscle, Fibrin/Slough, Muscle, Subcutaneous Subcutaneous Level: Skin/Subcutaneous Skin/Subcutaneous N/A Tissue/Muscle Tissue/Muscle Debridement Area (sq 9 14 N/A cm): Instrument: Blade, Curette, Forceps Blade, Curette, Forceps N/A Bleeding: Moderate Moderate N/A Hemostasis Achieved: Pressure Pressure N/A Procedural Pain: 0 0 N/A Post Procedural Pain: 0 0 N/A Debridement Treatment Procedure was tolerated Procedure was tolerated N/A Response: well well Post Debridement 2.5x3.6x0.3 4x3.5x0.3 N/A Measurements L x W x D (cm) Post Debridement 2.121 3.299 N/A Volume: (cm) Post Debridement Category/Stage III Category/Stage III N/A Stage: Periwound Skin Texture: Excoriation: No Excoriation: No N/A Induration: No Induration: No Callus: No Callus: No Crepitus: No Crepitus: No Rash: No Rash: No Scarring: No Scarring:  No Periwound Skin Maceration: No Maceration: No N/A Moisture: Dry/Scaly: No Dry/Scaly: No Periwound Skin Color: Atrophie Blanche: No Atrophie Blanche: No N/A Cyanosis: No Cyanosis: No Ledonne, Ahley ([id number]) Ecchymosis: No Ecchymosis: No Erythema: No Erythema: No Hemosiderin Staining: No Hemosiderin Staining: No Mottled: No Mottled: No Pallor: No Pallor: No Rubor: No Rubor: No Temperature: No Abnormality No Abnormality N/A Tenderness on Yes Yes N/A Palpation: Wound Preparation: Ulcer Cleansing: Ulcer Cleansing: N/A Rinsed/Irrigated with Rinsed/Irrigated with Saline Saline Topical Anesthetic Topical Anesthetic Applied: Other: lidocaine Applied: Other: lidocaine 4% 4% Procedures Performed: Debridement Debridement N/A Treatment Notes Wound #1 (Right Calcaneus) 1. Cleansed with: Clean wound with Normal Saline 2. Anesthetic Topical Lidocaine 4% cream to wound bed prior to debridement 4. Dressing Applied: Santyl Ointment 5. Secondary Dressing Applied Foam Gauze and Kerlix/Conform 7. Secured with Tape Wound #2 (Left Calcaneus) 1. Cleansed with: Clean wound with Normal Saline 2. Anesthetic Topical Lidocaine 4% cream to wound bed prior to debridement 4. Dressing Applied: Santyl Ointment 5. Secondary Dressing Applied Foam Gauze and Kerlix/Conform 7. Secured with Tape Electronic Signature(s) Sauget, Victoriya ([id number]) Signed: 08/18/2016 4:33:23 PM By: Baltazar Najjar MD Entered By: Baltazar Najjar on 08/18/2016 09:16:17 Friend, Modean ([id number]) -------------------------------------------------------------------------------- Multi-Disciplinary Care Plan Details Patient Name: [name] Date of Service: 08/18/2016 8:00 AM Medical Record Patient Account Number: [id number] Number: Treating RN: Curtis Sites 1935-09-14 (80 y.o. Other Clinician: Date of Birth/Sex: Female) Treating ROBSON, [name] Primary Care Ron Junco: Duncan Dull Jesika Men/Extender: G Referring Lisel Siegrist: Ardis Rowan in Treatment: 0 Active Inactive ` Abuse / Safety / Falls / Self Care Management Nursing Diagnoses: Impaired physical mobility Potential for falls Goals: Patient will remain injury free Date Initiated: 08/18/2016 Target Resolution Date: 10/29/2016 Goal  Status: Active Interventions: Assess fall risk on admission and as needed Notes: ` Nutrition Nursing Diagnoses: Potential for alteratiion in Nutrition/Potential for imbalanced nutrition Goals: Patient/caregiver agrees to and verbalizes understanding of need to use nutritional supplements and/or vitamins as prescribed Date Initiated: 08/18/2016 Target Resolution Date: 10/29/2016 Goal Status: Active Interventions: Assess patient nutrition upon admission and as needed per policy Notes: ` Orientation to the Wound Care Program [place], Raysa ([id number]) Nursing Diagnoses: Knowledge deficit related to the wound healing center program Goals: Patient/caregiver will verbalize understanding of the Wound Healing Center Program Date Initiated: 08/18/2016 Target Resolution Date: 10/29/2016 Goal Status: Active Interventions: Provide education on orientation to the wound center Notes: ` Wound/Skin Impairment Nursing Diagnoses: Impaired tissue integrity Goals: Patient/caregiver will verbalize understanding of skin care regimen Date Initiated: 08/18/2016 Target Resolution Date: 10/29/2016 Goal Status: Active Ulcer/skin breakdown will have a volume reduction of 30% by week 4 Date Initiated: 08/18/2016 Target Resolution Date: 10/29/2016 Goal Status: Active Ulcer/skin breakdown will have a volume reduction of 50% by week 8 Date Initiated: 08/18/2016 Target Resolution Date: 10/29/2016 Goal Status: Active Ulcer/skin breakdown will have a volume reduction of 80% by week 12 Date Initiated: 08/18/2016 Target Resolution Date: 10/29/2016 Goal Status: Active Ulcer/skin breakdown  will heal within 14 weeks Date Initiated: 08/18/2016 Target Resolution Date: 10/29/2016 Goal Status: Active Interventions: Assess patient/caregiver ability to obtain necessary supplies Assess patient/caregiver ability to perform ulcer/skin care regimen upon admission and as needed Assess ulceration(s) every visit Notes: Electronic Signature(s) Signed: 08/18/2016 5:41:54 PM By: Charlotta Newton, Kelsey ([id number]) Entered By: Curtis Sites on 08/18/2016 08:46:17 Sherburne, Erna ([id number]) -------------------------------------------------------------------------------- Pain Assessment Details Patient Name: [name] Date of Service: 08/18/2016 8:00 AM Medical Record Patient Account Number: [id number] Number: Treating RN: Curtis Sites Apr 14, 1936 (80 y.o. Other Clinician: Date of Birth/Sex: Female) Treating ROBSON, [name] Primary Care Garnette Greb: Duncan Dull Jeanne Diefendorf/Extender: G Referring Lakeitha Basques: Ardis Rowan in Treatment: 0 Active Problems Location of Pain Severity and Description of Pain Patient Has Paino Yes Site Locations Pain Location: Pain in Ulcers With Dressing Change: Yes Rate the pain. Current Pain Level: 5 Character of Pain Describe the Pain: Burning, Tender, Throbbing Pain Management and Medication Current Pain Management: Electronic Signature(s) Signed: 08/18/2016 5:41:54 PM By: Curtis Sites Entered By: Curtis Sites on 08/18/2016 08:15:55 Costley, Kaleisha ([id number]) -------------------------------------------------------------------------------- Patient/Caregiver Education Details Patient Name: [name] Date of Service: 08/18/2016 8:00 AM Medical Record Patient Account Number: [id number] Number: Treating RN: Curtis Sites [birth year] (80 y.o. Other Clinician: Date of Birth/Gender: Female) Treating ROBSON, [name] Primary Care Physician/Extender: Virgil Benedict Physician: Tania Ade in Treatment:  0 Referring Physician: Einar Crow Education Assessment Education Provided To: Patient and Caregiver SNF staff and dtr Education Topics Provided Wound/Skin Impairment: Handouts: Other: wound care as ordered Methods: Explain/Verbal Responses: State content correctly Electronic Signature(s) Signed: 08/18/2016 5:41:54 PM By: Curtis Sites Entered By: Curtis Sites on 08/18/2016 08:47:32 Connery, Alvenia ([id number]) -------------------------------------------------------------------------------- Wound Assessment Details Patient Name: [name] Date of Service: 08/18/2016 8:00 AM Medical Record Patient Account Number: [id number] Number: Treating RN: Curtis Sites [birth year] (80 y.o. Other Clinician: Date of Birth/Sex: Female) Treating ROBSON, [name] Primary Care Juno Alers: Duncan Dull Kimesha Claxton/Extender: G Referring Blessyn [place]: Ardis Rowan in Treatment: 0 Wound Status Wound Number: 1 Primary Pressure Ulcer Etiology: Wound Location: Right Calcaneus Wound Open Wounding Event: Pressure Injury Status: Date Acquired: 07/05/2016 Comorbid Cataracts, Anemia, Congestive Heart Weeks Of Treatment: 0 History: Failure, Hypertension, Type II Diabetes, Clustered Wound: No Gout, Dementia, Neuropathy Photos Photo Uploaded By: Curtis Sites on  08/18/2016 11:27:15 Wound Measurements Length: (cm) 2.5 Width: (cm) 3.6 Depth: (cm) 0.2 Area: (cm) 7.069 Volume: (cm) 1.414 % Reduction in Area: % Reduction in Volume: Epithelialization: None Tunneling: No Undermining: No Wound Description Classification: Category/Stage III Foul Odor Afte Diabetic Severity (Wagner): Grade 1 Due to Product Wound Margin: Flat and Intact Slough/Fibrino Exudate Amount: Large Exudate Type: Serous Exudate Color: amber r Cleansing: Yes Use: No No Wound Bed Granulation Amount: Small (1-33%) Exposed Structure Beaubien, Annesha ([id number]) Granulation Quality: Pink Fascia  Exposed: No Necrotic Amount: Large (67-100%) Fat Layer (Subcutaneous Tissue) Exposed: No Necrotic Quality: Eschar, Adherent Slough Tendon Exposed: No Muscle Exposed: No Joint Exposed: No Bone Exposed: No Limited to Skin Breakdown Periwound Skin Texture Texture Color No Abnormalities Noted: No No Abnormalities Noted: No Callus: No Atrophie Blanche: No Crepitus: No Cyanosis: No Excoriation: No Ecchymosis: No Induration: No Erythema: No Rash: No Hemosiderin Staining: No Scarring: No Mottled: No Pallor: No Moisture Rubor: No No Abnormalities Noted: No Dry / Scaly: No Temperature / Pain Maceration: No Temperature: No Abnormality Tenderness on Palpation: Yes Wound Preparation Ulcer Cleansing: Rinsed/Irrigated with Saline Topical Anesthetic Applied: Other: lidocaine 4%, Treatment Notes Wound #1 (Right Calcaneus) 1. Cleansed with: Clean wound with Normal Saline 2. Anesthetic Topical Lidocaine 4% cream to wound bed prior to debridement 4. Dressing Applied: Santyl Ointment 5. Secondary Dressing Applied Foam Gauze and Kerlix/Conform 7. Secured with Secretary/administrator) Signed: 08/18/2016 5:41:54 PM By: Curtis Sites Entered By: Curtis Sites on 08/18/2016 08:39:57 Grussing, Jaela ([id number]) -------------------------------------------------------------------------------- Wound Assessment Details Patient Name: [name] Date of Service: 08/18/2016 8:00 AM Medical Record Patient Account Number: [id number] Number: Treating RN: Curtis Sites 01-05-1936 (80 y.o. Other Clinician: Date of Birth/Sex: Female) Treating ROBSON, [name] Primary Care Jemery Stacey: Duncan Dull Ronika Kelson/Extender: G Referring Avice Funchess: Ardis Rowan in Treatment: 0 Wound Status Wound Number: 2 Primary Pressure Ulcer Etiology: Wound Location: Left Calcaneus Wound Open Wounding Event: Pressure Injury Status: Date Acquired: 07/05/2016 Comorbid Cataracts,  Anemia, Congestive Heart Weeks Of Treatment: 0 History: Failure, Hypertension, Type II Diabetes, Clustered Wound: No Gout, Dementia, Neuropathy Photos Photo Uploaded By: Curtis Sites on 08/18/2016 11:28:01 Wound Measurements Length: (cm) 4 Width: (cm) 3.5 Depth: (cm) 0.2 Area: (cm) 10.996 Volume: (cm) 2.199 % Reduction in Area: % Reduction in Volume: Epithelialization: None Tunneling: No Undermining: No Wound Description Classification: Category/Stage III Foul Odor Afte Diabetic Severity (Wagner): Grade 1 Due to Product Wound Margin: Flat and Intact Slough/Fibrino Exudate Amount: Large Exudate Type: Serous Exudate Color: amber r Cleansing: Yes Use: No No Wound Bed Granulation Amount: Small (1-33%) Exposed Structure Kershaw, Mariadelosang ([id number]) Granulation Quality: Pink Fascia Exposed: No Necrotic Amount: Large (67-100%) Fat Layer (Subcutaneous Tissue) Exposed: No Necrotic Quality: Eschar, Adherent Slough Tendon Exposed: No Muscle Exposed: No Joint Exposed: No Bone Exposed: No Limited to Skin Breakdown Periwound Skin Texture Texture Color No Abnormalities Noted: No No Abnormalities Noted: No Callus: No Atrophie Blanche: No Crepitus: No Cyanosis: No Excoriation: No Ecchymosis: No Induration: No Erythema: No Rash: No Hemosiderin Staining: No Scarring: No Mottled: No Pallor: No Moisture Rubor: No No Abnormalities Noted: No Dry / Scaly: No Temperature / Pain Maceration: No Temperature: No Abnormality Tenderness on Palpation: Yes Wound Preparation Ulcer Cleansing: Rinsed/Irrigated with Saline Topical Anesthetic Applied: Other: lidocaine 4%, Treatment Notes Wound #2 (Left Calcaneus) 1. Cleansed with: Clean wound with Normal Saline 2. Anesthetic Topical Lidocaine 4% cream to wound bed prior to debridement 4. Dressing Applied: Santyl Ointment 5. Secondary Dressing Applied Foam Gauze and Kerlix/Conform 7. Secured with  Tape Electronic  Signature(s) Signed: 08/18/2016 5:41:54 PM By: Curtis Sites Entered By: Curtis Sites on 08/18/2016 08:41:00 Mendibles, Chaunta ([id number]) -------------------------------------------------------------------------------- Vitals Details Patient Name: [name] Date of Service: 08/18/2016 8:00 AM Medical Record Patient Account Number: [id number] Number: Treating RN: Curtis Sites 1935/10/22 (80 y.o. Other Clinician: Date of Birth/Sex: Female) Treating ROBSON, [name] Primary Care Deitrich Steve: Duncan Dull Theophilus Walz/Extender: G Referring Abdur Hoglund: Ardis Rowan in Treatment: 0 Vital Signs Time Taken: 08:15 Temperature (F): 97.7 Height (in): 66 Pulse (bpm): 79 Source: Stated Respiratory Rate (breaths/min): 16 Weight (lbs): 140.8 Blood Pressure (mmHg): 136/38 Source: Measured Reference Range: 80 - 120 mg / dl Body Mass Index (BMI): 22.7 Electronic Signature(s) Signed: 08/18/2016 5:41:54 PM By: Curtis Sites Entered By: Curtis Sites on 08/18/2016 08:17:21

## 2016-08-25 ENCOUNTER — Encounter: Payer: Medicare (Managed Care) | Attending: Internal Medicine | Admitting: Internal Medicine

## 2016-08-25 DIAGNOSIS — G309 Alzheimer's disease, unspecified: Secondary | ICD-10-CM | POA: Insufficient documentation

## 2016-08-25 DIAGNOSIS — M109 Gout, unspecified: Secondary | ICD-10-CM | POA: Diagnosis not present

## 2016-08-25 DIAGNOSIS — F0280 Dementia in other diseases classified elsewhere without behavioral disturbance: Secondary | ICD-10-CM | POA: Insufficient documentation

## 2016-08-25 DIAGNOSIS — D649 Anemia, unspecified: Secondary | ICD-10-CM | POA: Insufficient documentation

## 2016-08-25 DIAGNOSIS — E1122 Type 2 diabetes mellitus with diabetic chronic kidney disease: Secondary | ICD-10-CM | POA: Insufficient documentation

## 2016-08-25 DIAGNOSIS — L89623 Pressure ulcer of left heel, stage 3: Secondary | ICD-10-CM | POA: Insufficient documentation

## 2016-08-25 DIAGNOSIS — I129 Hypertensive chronic kidney disease with stage 1 through stage 4 chronic kidney disease, or unspecified chronic kidney disease: Secondary | ICD-10-CM | POA: Diagnosis not present

## 2016-08-25 DIAGNOSIS — N184 Chronic kidney disease, stage 4 (severe): Secondary | ICD-10-CM | POA: Insufficient documentation

## 2016-08-25 DIAGNOSIS — L89613 Pressure ulcer of right heel, stage 3: Secondary | ICD-10-CM | POA: Insufficient documentation

## 2016-08-25 DIAGNOSIS — E1140 Type 2 diabetes mellitus with diabetic neuropathy, unspecified: Secondary | ICD-10-CM | POA: Insufficient documentation

## 2016-08-25 DIAGNOSIS — E11621 Type 2 diabetes mellitus with foot ulcer: Secondary | ICD-10-CM | POA: Diagnosis not present

## 2016-08-27 NOTE — Progress Notes (Signed)
[name], [name] Visit Report for 08/25/2016 Chief Complaint Document Details Patient Name: [name], [name] Date of Service: 08/25/2016 11:00 AM Medical Record Patient Account Number: [id number] Number: Treating RN: Curtis Sites 05-31-1935 (81 y.o. Other Clinician: Date of Birth/Sex: Female) Treating Kamarii Buren Primary Care Provider: Duncan Dull Provider/Extender: G Referring Provider: Denton Brick in Treatment: 1 Information Obtained from: Patient Chief Complaint 08/18/16; patient is here for review of bilateral heel ulcers Electronic Signature(s) Signed: 08/25/2016 5:52:53 PM By: Baltazar Najjar MD Entered By: Baltazar Najjar on 08/25/2016 12:04:50 [name], [name] ([id number]) -------------------------------------------------------------------------------- Debridement Details Patient Name: [name] Date of Service: 08/25/2016 11:00 AM Medical Record Patient Account Number: [id number] Number: Treating RN: Curtis Sites 02/14/1936 (81 y.o. Other Clinician: Date of Birth/Sex: Female) Treating Natoshia Souter Primary Care Provider: Duncan Dull Provider/Extender: G Referring Provider: Denton Brick in Treatment: 1 Debridement Performed for Wound #1 Right Calcaneus Assessment: Performed By: Physician Maxwell Caul, MD Debridement: Debridement Pre-procedure Yes - 11:22 Verification/Time Out Taken: Start Time: 11:22 Pain Control: Lidocaine 4% Topical Solution Level: Skin/Subcutaneous Tissue Total Area Debrided (L x 1.9 (cm) x 3.4 (cm) = 6.46 (cm) W): Tissue and other Viable, Non-Viable, Eschar, Fibrin/Slough, Subcutaneous material debrided: Instrument: Curette Bleeding: Moderate Hemostasis Achieved: Pressure End Time: 11:24 Procedural Pain: 0 Post Procedural Pain: 0 Response to Treatment: Procedure was tolerated well Post Debridement Measurements of Total Wound Length: (cm) 1.9 Stage: Category/Stage  III Width: (cm) 3.4 Depth: (cm) 0.5 Volume: (cm) 2.537 Character of Wound/Ulcer Post Improved Debridement: Severity of Tissue Post Fat layer exposed Debridement: Post Procedure Diagnosis Same as Pre-procedure Electronic Signature(s) Signed: 08/25/2016 4:53:48 PM By: Charlotta Newton, Gerene ([id number]) Signed: 08/25/2016 5:52:53 PM By: Baltazar Najjar MD Entered By: Baltazar Najjar on 08/25/2016 12:04:41 [name], [name] ([id number]) -------------------------------------------------------------------------------- HPI Details Patient Name: [name] Date of Service: 08/25/2016 11:00 AM Medical Record Patient Account Number: [id number] Number: Treating RN: Curtis Sites 08-23-1935 (81 y.o. Other Clinician: Date of Birth/Sex: Female) Treating Ousmane Seeman Primary Care Provider: Duncan Dull Provider/Extender: G Referring Provider: Denton Brick in Treatment: 1 History of Present Illness HPI Description: 08/18/16; this is a 81 year old woman who is a type II diabetic not currently on any treatment. She is also listed as having Alzheimer's disease hypertension. She has a history of chronic venous insufficiency with leg wounds in the past but no history of wounds in her feet. In terms of her diabetes at one point she was on insulin however she is no longer on any current treatment, her daughter who is providing most of the history is unaware of her hemoglobin A1c. She has stage IV chronic renal failure and follows with nephrology. The history provided by her daughter is that she has had a wound on the left heel perhaps dating back to last May/17. At that point she was in the hospital predominantly with psychiatric issues and was discharged [facility]. This was treated with some form of foam dressing and may have actually healed however she was readmitted to hospital in January with Escherichia coli sepsis felt to be secondary to a UTI. Since then she is in  [facility]. Apparently this timeframe both the left heel and right heel reopened or at least the daughter became aware that they were both open. The exact timeframe isn't really certain Her ABIs in this clinic were 1.08 on the right 1.25 on the left. Looking through [employer] doesn't really provide any useful information. She did have venous ultrasounds in  2016 that did not show a DVT. I don't see a recent hemoglobin A1c 08/25/16; from last week we have been using Santyl to both heels. Apparently the nursing home didn't get an x-ray of both heels [[facility]] and the daughter states that there was "no injury to bone" but for some reason we haven't been able to get the official report from them. Electronic Signature(s) Signed: 08/25/2016 5:52:53 PM By: Baltazar Najjarobson, Neesa Knapik MD Entered By: Baltazar Najjarobson, Sofya Moustafa on 08/25/2016 12:06:31 [name], [name] ([id number]) -------------------------------------------------------------------------------- Physical Exam Details Patient Name: [name], [name] Date of Service: 08/25/2016 11:00 AM Medical Record Patient Account Number: [id number] Number: Treating RN: Curtis SitesDorthy, [name] 10-Dec-1935 (81 y.o. Other Clinician: Date of Birth/Sex: Female) Treating Simran Bomkamp Primary Care Provider: Duncan Dullullo, Teresa Provider/Extender: G Referring Provider: Denton Brickullo, Teresa Weeks in Treatment: 1 Constitutional Sitting or standing Blood Pressure is within target range for patient.. Pulse regular and within target range for patient.Marland Kitchen. Respirations regular, non-labored and within target range.. Temperature is normal and within the target range for the patient.. Patient's appearance is neat and clean. Appears in no acute distress. Well nourished and well developed.. Cardiovascular Pedal pulses palpable and strong bilaterally. Palpable at both the dorsalis pedis and posterior tibial. Edema present in both extremities. Chronic venous insufficiency but  no open wounds in either leg. Notes Wound exam; the patient arrived with her wounds on the large area of the tip of her left heel. Base of this looks fairly stable and improved no debridement on the left oThe area on the right required debridement with a #5 curet of necrotic surface material. This is still a reasonably aggressive debridement. Hemostasis with direct pressure. There is no evidence of surrounding infection Electronic Signature(s) Signed: 08/25/2016 5:52:53 PM By: Baltazar Najjarobson, Cannie Muckle MD Entered By: Baltazar Najjarobson, Pearley Baranek on 08/25/2016 12:08:31 [name], [name] ([id number]) -------------------------------------------------------------------------------- Physician Orders Details Patient Name: [name], [name] Date of Service: 08/25/2016 11:00 AM Medical Record Patient Account Number: [id number] Number: Treating RN: Curtis SitesDorthy, [name] 10-Dec-1935 (81 y.o. Other Clinician: Date of Birth/Sex: Female) Treating Shakur Lembo Primary Care Provider: Duncan Dullullo, Teresa Provider/Extender: G Referring Provider: Denton Brickullo, Teresa Weeks in Treatment: 1 Verbal / Phone Orders: No Diagnosis Coding Wound Cleansing Wound #1 Right Calcaneus o Clean wound with Normal Saline. o May Shower, gently pat wound dry prior to applying new dressing. Wound #2 Left Calcaneus o Clean wound with Normal Saline. o May Shower, gently pat wound dry prior to applying new dressing. Anesthetic Wound #1 Right Calcaneus o Topical Lidocaine 4% cream applied to wound bed prior to debridement Wound #2 Left Calcaneus o Topical Lidocaine 4% cream applied to wound bed prior to debridement Primary Wound Dressing Wound #1 Right Calcaneus o Santyl Ointment Wound #2 Left Calcaneus o Santyl Ointment Secondary Dressing Wound #1 Right Calcaneus o Gauze and Kerlix/Conform o Foam - heel cups Wound #2 Left Calcaneus o Gauze and Kerlix/Conform o Foam - heel cups Dressing Change Frequency Wound #1 Right  Calcaneus Salman, [name] ([id number]) o Change dressing every day. Wound #2 Left Calcaneus o Change dressing every day. Follow-up Appointments Wound #1 Right Calcaneus o Return Appointment in 1 week. Wound #2 Left Calcaneus o Return Appointment in 1 week. Edema Control Wound #1 Right Calcaneus o Elevate legs to the level of the heart and pump ankles as often as possible Wound #2 Left Calcaneus o Elevate legs to the level of the heart and pump ankles as often as possible Off-Loading Wound #1 Right Calcaneus o Turn and reposition every 2 hours o Other: - wear heel  protector boots at al times in bed, float heels while in bed, Wound #2 Left Calcaneus o Turn and reposition every 2 hours o Other: - wear heel protector boots at al times in bed, float heels while in bed, Additional Orders / Instructions Wound #1 Right Calcaneus o Increase protein intake. o Other: - please add vitamin A, vitamin C and zinc supplements to your diet Wound #2 Left Calcaneus o Increase protein intake. o Other: - please add vitamin A, vitamin C and zinc supplements to your diet Notes PLEASE FAX RESULTS OF MOBILE XRAYS TO [facility] AT [phone] Electronic Signature(s) Signed: 08/25/2016 4:53:48 PM By: Curtis Sites Signed: 08/25/2016 5:52:53 PM By: Baltazar Najjar MD Entered By: Curtis Sites on 08/25/2016 11:25:00 [name], [name] ([id number]) -------------------------------------------------------------------------------- Problem List Details Patient Name: [name] Date of Service: 08/25/2016 11:00 AM Medical Record Patient Account Number: [id number] Number: Treating RN: Curtis Sites 06-01-1935 (80 y.o. Other Clinician: Date of Birth/Sex: Female) Treating [name] Haupt Primary Care Provider: Duncan Dull Provider/Extender: G Referring Provider: Denton Brick in Treatment: 1 Active Problems ICD-10 Encounter Code Description  Active Date Diagnosis E11.621 Type 2 diabetes mellitus with foot ulcer 08/18/2016 Yes L89.623 Pressure ulcer of left heel, stage 3 08/18/2016 Yes L89.613 Pressure ulcer of right heel, stage 3 08/18/2016 Yes Inactive Problems Resolved Problems Electronic Signature(s) Signed: 08/25/2016 5:52:53 PM By: Baltazar Najjar MD Entered By: Baltazar Najjar on 08/25/2016 12:04:20 [name], Crystalee ([id number]) -------------------------------------------------------------------------------- Progress Note Details Patient Name: [name] Date of Service: 08/25/2016 11:00 AM Medical Record Patient Account Number: [id number] Number: Treating RN: Curtis Sites 31-Dec-1935 (80 y.o. Other Clinician: Date of Birth/Sex: Female) Treating Keary Hanak Primary Care Provider: Duncan Dull Provider/Extender: G Referring Provider: Denton Brick in Treatment: 1 Subjective Chief Complaint Information obtained from Patient 08/18/16; patient is here for review of bilateral heel ulcers History of Present Illness (HPI) 08/18/16; this is a 81 year old woman who is a type II diabetic not currently on any treatment. She is also listed as having Alzheimer's disease hypertension. She has a history of chronic venous insufficiency with leg wounds in the past but no history of wounds in her feet. In terms of her diabetes at one point she was on insulin however she is no longer on any current treatment, her daughter who is providing most of the history is unaware of her hemoglobin A1c. She has stage IV chronic renal failure and follows with nephrology. The history provided by her daughter is that she has had a wound on the left heel perhaps dating back to last May/17. At that point she was in the hospital predominantly with psychiatric issues and was discharged to [facility]. This was treated with some form of foam dressing and may have actually healed however she was readmitted to hospital in January with  Escherichia coli sepsis felt to be secondary to a UTI. Since then she is in [facility] skilled [facility]. Apparently this timeframe both the left heel and right heel reopened or at least the daughter became aware that they were both open. The exact timeframe isn't really certain Her ABIs in this clinic were 1.08 on the right 1.25 on the left. Looking through [employer] doesn't really provide any useful information. She did have venous ultrasounds in 2016 that did not show a DVT. I don't see a recent hemoglobin A1c 08/25/16; from last week we have been using Santyl to both heels. Apparently the nursing home didn't get an x-ray of both heels [  [facility]] and the daughter states that there was "no injury to bone" but for some reason we haven't been able to get the official report from them. Objective Constitutional Sitting or standing Blood Pressure is within target range for patient.. Pulse regular and within target range for patient.Marland Kitchen Respirations regular, non-labored and within target range.. Temperature is normal and within [name], [name] the target range for the patient.. Patient's appearance is neat and clean. Appears in no acute distress. Well nourished and well developed.. Vitals Time Taken: 11:04 AM, Height: 66 in, Weight: 140.8 lbs, BMI: 22.7, Temperature: 97.4 F, Pulse: 77 bpm, Respiratory Rate: 16 breaths/min, Blood Pressure: 118/37 mmHg. Cardiovascular Pedal pulses palpable and strong bilaterally. Palpable at both the dorsalis pedis and posterior tibial. Edema present in both extremities. Chronic venous insufficiency but no open wounds in either leg. General Notes: Wound exam; the patient arrived with her wounds on the large area of the tip of her left heel. Base of this looks fairly stable and improved no debridement on the left The area on the right required debridement with a #5 curet of necrotic surface material. This is still a reasonably  aggressive debridement. Hemostasis with direct pressure. There is no evidence of surrounding infection Integumentary (Hair, Skin) Wound #1 status is Open. Original cause of wound was Pressure Injury. The wound is located on the Right Calcaneus. The wound measures 1.9cm length x 3.4cm width x 0.4cm depth; 5.074cm^2 area and 2.029cm^3 volume. The wound is limited to skin breakdown. There is no tunneling or undermining noted. There is a large amount of serous drainage noted. The wound margin is flat and intact. There is medium (34-66%) pink granulation within the wound bed. There is a medium (34-66%) amount of necrotic tissue within the wound bed including Eschar and Adherent Slough. The periwound skin appearance did not exhibit: Callus, Crepitus, Excoriation, Induration, Rash, Scarring, Dry/Scaly, Maceration, Atrophie Blanche, Cyanosis, Ecchymosis, Hemosiderin Staining, Mottled, Pallor, Rubor, Erythema. Periwound temperature was noted as No Abnormality. The periwound has tenderness on palpation. Wound #2 status is Open. Original cause of wound was Pressure Injury. The wound is located on the Left Calcaneus. The wound measures 3.3cm length x 4cm width x 0.2cm depth; 10.367cm^2 area and 2.073cm^3 volume. The wound is limited to skin breakdown. There is no tunneling or undermining noted. There is a large amount of serous drainage noted. The wound margin is flat and intact. There is large (67- 100%) pink granulation within the wound bed. There is a small (1-33%) amount of necrotic tissue within the wound bed including Eschar and Adherent Slough. The periwound skin appearance did not exhibit: Callus, Crepitus, Excoriation, Induration, Rash, Scarring, Dry/Scaly, Maceration, Atrophie Blanche, Cyanosis, Ecchymosis, Hemosiderin Staining, Mottled, Pallor, Rubor, Erythema. Periwound temperature was noted as No Abnormality. The periwound has tenderness on palpation. Assessment Active  Problems ICD-10 E11.621 - Type 2 diabetes mellitus with foot ulcer L89.623 - Pressure ulcer of left heel, stage 3 L89.613 - Pressure ulcer of right heel, stage 3 [place], Maricel ([id number]) Procedures Wound #1 Wound #1 is a Pressure Ulcer located on the Right Calcaneus . There was a Skin/Subcutaneous Tissue Debridement ([id number]) debridement with total area of 6.46 sq cm performed by Maxwell Caul, MD. with the following instrument(s): Curette to remove Viable and Non-Viable tissue/material including Fibrin/Slough, Eschar, and Subcutaneous after achieving pain control using Lidocaine 4% Topical Solution. A time out was conducted at 11:22, prior to the start of the procedure. A Moderate amount of bleeding was controlled with Pressure. The  procedure was tolerated well with a pain level of 0 throughout and a pain level of 0 following the procedure. Post Debridement Measurements: 1.9cm length x 3.4cm width x 0.5cm depth; 2.537cm^3 volume. Post debridement Stage noted as Category/Stage III. Character of Wound/Ulcer Post Debridement is improved. Severity of Tissue Post Debridement is: Fat layer exposed. Post procedure Diagnosis Wound #1: Same as Pre-Procedure Plan Wound Cleansing: Wound #1 Right Calcaneus: Clean wound with Normal Saline. May Shower, gently pat wound dry prior to applying new dressing. Wound #2 Left Calcaneus: Clean wound with Normal Saline. May Shower, gently pat wound dry prior to applying new dressing. Anesthetic: Wound #1 Right Calcaneus: Topical Lidocaine 4% cream applied to wound bed prior to debridement Wound #2 Left Calcaneus: Topical Lidocaine 4% cream applied to wound bed prior to debridement Primary Wound Dressing: Wound #1 Right Calcaneus: Santyl Ointment Wound #2 Left Calcaneus: Santyl Ointment Secondary Dressing: Wound #1 Right Calcaneus: Gauze and Kerlix/Conform Foam - heel cups Wound #2 Left Calcaneus: [place], Ruta ([id number]) Gauze and  Kerlix/Conform Foam - heel cups Dressing Change Frequency: Wound #1 Right Calcaneus: Change dressing every day. Wound #2 Left Calcaneus: Change dressing every day. Follow-up Appointments: Wound #1 Right Calcaneus: Return Appointment in 1 week. Wound #2 Left Calcaneus: Return Appointment in 1 week. Edema Control: Wound #1 Right Calcaneus: Elevate legs to the level of the heart and pump ankles as often as possible Wound #2 Left Calcaneus: Elevate legs to the level of the heart and pump ankles as often as possible Off-Loading: Wound #1 Right Calcaneus: Turn and reposition every 2 hours Other: - wear heel protector boots at al times in bed, float heels while in bed, Wound #2 Left Calcaneus: Turn and reposition every 2 hours Other: - wear heel protector boots at al times in bed, float heels while in bed, Additional Orders / Instructions: Wound #1 Right Calcaneus: Increase protein intake. Other: - please add vitamin A, vitamin C and zinc supplements to your diet Wound #2 Left Calcaneus: Increase protein intake. Other: - please add vitamin A, vitamin C and zinc supplements to your diet General Notes: PLEASE FAX RESULTS OF MOBILE XRAYS TO [facility] AT [phone] #1 continue with the Santyl based dressings changed daily at the nursing home. #2 it is really the right heel wound that requires continued debridement. #3 both of these wounds are stage III there is no exposed bone and no surrounding infection Electronic Signature(s) Signed: 08/25/2016 5:52:53 PM By: Baltazar Najjar MD Entered By: Baltazar Najjar on 08/25/2016 12:09:23 [place], Adileny ([id number]) [place], Sunjai ([id number]) -------------------------------------------------------------------------------- SuperBill Details Patient Name: [name] Date of Service: 08/25/2016 Medical Record Patient Account Number: [id number] Number: Treating RN: Curtis Sites 28-Apr-1935 (80 y.o. Other  Clinician: Date of Birth/Sex: Female) Treating Alexyss Balzarini Primary Care Provider: Duncan Dull Provider/Extender: G Referring Provider: Denton Brick in Treatment: 1 Diagnosis Coding ICD-10 Codes Code Description E11.621 Type 2 diabetes mellitus with foot ulcer L89.623 Pressure ulcer of left heel, stage 3 L89.613 Pressure ulcer of right heel, stage 3 Facility Procedures CPT4 Code: [id number] Description: 11042 - DEB SUBQ TISSUE 20 SQ CM/< ICD-10 Description Diagnosis L89.613 Pressure ulcer of right heel, stage 3 Modifier: Quantity: 1 Physician Procedures CPT4 Code: [id number] Description: 11042 - WC PHYS SUBQ TISS 20 SQ CM ICD-10 Description Diagnosis L89.613 Pressure ulcer of right heel, stage 3 Modifier: Quantity: 1 Electronic Signature(s) Signed: 08/25/2016 5:52:53 PM By: Baltazar Najjar MD Entered By: Baltazar Najjar on 08/25/2016 12:09:48

## 2016-08-27 NOTE — Progress Notes (Signed)
Imaan, Padgett Sumaya ([id number]) Visit Report for 08/25/2016 Arrival Information Details Patient Name: [name], [name] Date of Service: 08/25/2016 11:00 AM Medical Record Patient Account Number: [id number] Number: Treating RN: Curtis Sites [birth year] (80 y.o. Other Clinician: Date of Birth/Sex: Female) Treating ROBSON, [name] Primary Care Davey Bergsma: Duncan Dull Daryus Sowash/Extender: G Referring Ericia Moxley: Denton Brick in Treatment: 1 Visit Information History Since Last Visit Added or deleted any medications: No Patient Arrived: Walker Any new allergies or adverse reactions: No Arrival Time: 11:02 Had a fall or experienced change in No Accompanied By: dtr activities of daily living that may affect Transfer Assistance: None risk of falls: Patient Identification Verified: Yes Signs or symptoms of abuse/neglect since last No Secondary Verification Process Completed: Yes visito Patient Requires Transmission-Based No Hospitalized since last visit: No Precautions: Has Dressing in Place as Prescribed: Yes Patient Has Alerts: Yes Pain Present Now: No Patient Alerts: DM II Electronic Signature(s) Signed: 08/25/2016 4:53:48 PM By: Curtis Sites Entered By: Curtis Sites on 08/25/2016 11:02:59 [name], [name] -------------------------------------------------------------------------------- Encounter Discharge Information Details Patient Name: [name] Date of Service: 08/25/2016 11:00 AM Medical Record Patient Account Number: [id number] Number: Treating RN: Curtis Sites 12-02-1935 (80 y.o. Other Clinician: Date of Birth/Sex: Female) Treating ROBSON, [name] Primary Care Kyre Jeffries: Duncan Dull Dannisha Eckmann/Extender: G Referring Dreshaun Stene: Denton Brick in Treatment: 1 Encounter Discharge Information Items Discharge Pain Level: 0 Discharge Condition: Stable Ambulatory Status: Walker Nursing Discharge Destination: Home Transportation:  Private Auto Accompanied By: dtr Schedule Follow-up Appointment: Yes Medication Reconciliation completed No and provided to Patient/Care Tana Trefry: Provided on Clinical Summary of Care: 08/25/2016 Form Type Recipient Paper Patient [facility] Electronic Signature(s) Signed: 08/25/2016 11:40:53 AM By: Gwenlyn Perking Entered By: Gwenlyn Perking on 08/25/2016 11:40:53 [name], [name] ([id number]) -------------------------------------------------------------------------------- Lower Extremity Assessment Details Patient Name: [name] Date of Service: 08/25/2016 11:00 AM Medical Record Patient Account Number: [id number] Number: Treating RN: Curtis Sites 27-Jan-1936 (80 y.o. Other Clinician: Date of Birth/Sex: Female) Treating ROBSON, [name] Primary Care Ladaisha Portillo: Duncan Dull Daruis Swaim/Extender: G Referring Stpehen Petitjean: Denton Brick in Treatment: 1 Vascular Assessment Pulses: Dorsalis Pedis Palpable: [Left:Yes] [Right:Yes] Posterior Tibial Extremity colors, hair growth, and conditions: Extremity Color: [Left:Normal] [Right:Normal] Hair Growth on Extremity: [Left:No] [Right:No] Temperature of Extremity: [Left:Warm] [Right:Warm] Capillary Refill: [Left:< 3 seconds] [Right:< 3 seconds] Electronic Signature(s) Signed: 08/25/2016 4:53:48 PM By: Curtis Sites Entered By: Curtis Sites on 08/25/2016 11:17:56 [name], [name] ([id number]) -------------------------------------------------------------------------------- Multi Wound Chart Details Patient Name: [name] Date of Service: 08/25/2016 11:00 AM Medical Record Patient Account Number: [id number] Number: Treating RN: Curtis Sites [birth year] (80 y.o. Other Clinician: Date of Birth/Sex: Female) Treating ROBSON, [name] Primary Care Leighanne Adolph: Duncan Dull Graycen Degan/Extender: G Referring Svea Pusch: Denton Brick in Treatment: 1 Vital Signs Height(in): 66 Pulse(bpm): 77 Weight(lbs): 140.8 Blood  Pressure 118/37 (mmHg): Body Mass Index(BMI): 23 Temperature(F): 97.4 Respiratory Rate 16 (breaths/min): Photos: [1:No Photos] [2:No Photos] [N/A:N/A] Wound Location: [1:Right Calcaneus] [2:Left Calcaneus] [N/A:N/A] Wounding Event: [1:Pressure Injury] [2:Pressure Injury] [N/A:N/A] Primary Etiology: [1:Pressure Ulcer] [2:Pressure Ulcer] [N/A:N/A] Comorbid History: [1:Cataracts, Anemia, Congestive Heart Failure, Congestive Heart Failure, Hypertension, Type II Diabetes, Gout, Dementia, Diabetes, Gout, Dementia, Neuropathy] [2:Cataracts, Anemia, Hypertension, Type II Neuropathy] [N/A:N/A] Date Acquired: [1:07/05/2016] [2:07/05/2016] [N/A:N/A] Weeks of Treatment: [1:1] [2:1] [N/A:N/A] Wound Status: [1:Open] [2:Open] [N/A:N/A] Measurements L x W x D 1.9x3.4x0.4 [2:3.3x4x0.2] [N/A:N/A] (cm) Area (cm) : [1:5.074] [2:10.367] [N/A:N/A] Volume (cm) : [1:2.029] [2:2.073] [N/A:N/A] % Reduction in Area: [1:28.20%] [2:5.70%] [N/A:N/A] % Reduction in Volume: -43.50% [2:5.70%] [N/A:N/A] Classification: [1:Category/Stage III] [2:Category/Stage III] [N/A:N/A] HBO  Classification: [1:Grade 1] [2:Grade 1] [N/A:N/A] Exudate Amount: [1:Large] [2:Large] [N/A:N/A] Exudate Type: [1:Serous] [2:Serous] [N/A:N/A] Exudate Color: [1:amber] [2:amber] [N/A:N/A] Foul Odor After [1:Yes] [2:Yes] [N/A:N/A] Cleansing: Odor Anticipated Due to No [2:No] [N/A:N/A] Product Use: Wound Margin: [1:Flat and Intact] [2:Flat and Intact] [N/A:N/A] Granulation Amount: Medium (34-66%) Large (67-100%) N/A Granulation Quality: Pink Pink N/A Necrotic Amount: Medium (34-66%) Small (1-33%) N/A Necrotic Tissue: Eschar, Adherent Slough Eschar, Adherent Slough N/A Exposed Structures: Fascia: No Fascia: No N/A Fat Layer (Subcutaneous Fat Layer (Subcutaneous Tissue) Exposed: No Tissue) Exposed: No Tendon: No Tendon: No Muscle: No Muscle: No Joint: No Joint: No Bone: No Bone: No Limited to Skin Limited to Skin Breakdown  Breakdown Epithelialization: None None N/A Debridement: Debridement ([id number]- N/A N/A 11047) Pre-procedure 11:22 N/A N/A Verification/Time Out Taken: Pain Control: Lidocaine 4% Topical N/A N/A Solution Tissue Debrided: Necrotic/Eschar, N/A N/A Fibrin/Slough, Subcutaneous Level: Skin/Subcutaneous N/A N/A Tissue Debridement Area (sq 6.46 N/A N/A cm): Instrument: Curette N/A N/A Bleeding: Moderate N/A N/A Hemostasis Achieved: Pressure N/A N/A Procedural Pain: 0 N/A N/A Post Procedural Pain: 0 N/A N/A Debridement Treatment Procedure was tolerated N/A N/A Response: well Post Debridement 1.9x3.4x0.5 N/A N/A Measurements L x W x D (cm) Post Debridement 2.537 N/A N/A Volume: (cm) Post Debridement Category/Stage III N/A N/A Stage: Periwound Skin Texture: Excoriation: No Excoriation: No N/A Induration: No Induration: No Callus: No Callus: No Crepitus: No Crepitus: No Rash: No Rash: No Scarring: No Scarring: No Periwound Skin Maceration: No Maceration: No N/A Moisture: Dry/Scaly: No Dry/Scaly: No [name], [name] ([id number]) Periwound Skin Color: Atrophie Blanche: No Atrophie Blanche: No N/A Cyanosis: No Cyanosis: No Ecchymosis: No Ecchymosis: No Erythema: No Erythema: No Hemosiderin Staining: No Hemosiderin Staining: No Mottled: No Mottled: No Pallor: No Pallor: No Rubor: No Rubor: No Temperature: No Abnormality No Abnormality N/A Tenderness on Yes Yes N/A Palpation: Wound Preparation: Ulcer Cleansing: Ulcer Cleansing: N/A Rinsed/Irrigated with Rinsed/Irrigated with Saline Saline Topical Anesthetic Topical Anesthetic Applied: Other: lidocaine Applied: Other: lidocaine 4% 4% Procedures Performed: Debridement N/A N/A Treatment Notes Wound #1 (Right Calcaneus) 1. Cleansed with: Clean wound with Normal Saline 2. Anesthetic Topical Lidocaine 4% cream to wound bed prior to debridement 4. Dressing Applied: Santyl Ointment 5. Secondary Dressing  Applied Foam Gauze and Kerlix/Conform 7. Secured with Tape Wound #2 (Left Calcaneus) 1. Cleansed with: Clean wound with Normal Saline 2. Anesthetic Topical Lidocaine 4% cream to wound bed prior to debridement 4. Dressing Applied: Santyl Ointment 5. Secondary Dressing Applied Foam Gauze and Kerlix/Conform 7. Secured with Tape [name], [name] ([id number]) Electronic Signature(s) Signed: 08/25/2016 5:52:53 PM By: Baltazar Najjar MD Entered By: Baltazar Najjar on 08/25/2016 12:04:30 Cowden, Adaleen ([id number]) -------------------------------------------------------------------------------- Multi-Disciplinary Care Plan Details Patient Name: [name] Date of Service: 08/25/2016 11:00 AM Medical Record Patient Account Number: [id number] Number: Treating RN: Curtis Sites 06-Apr-1936 (80 y.o. Other Clinician: Date of Birth/Sex: Female) Treating ROBSON, [name] Primary Care Julez Huseby: Duncan Dull Mung Rinker/Extender: G Referring Tally Mckinnon: Denton Brick in Treatment: 1 Active Inactive ` Abuse / Safety / Falls / Self Care Management Nursing Diagnoses: Impaired physical mobility Potential for falls Goals: Patient will remain injury free Date Initiated: 08/18/2016 Target Resolution Date: 10/29/2016 Goal Status: Active Interventions: Assess fall risk on admission and as needed Notes: ` Nutrition Nursing Diagnoses: Potential for alteratiion in Nutrition/Potential for imbalanced nutrition Goals: Patient/caregiver agrees to and verbalizes understanding of need to use nutritional supplements and/or vitamins as prescribed Date Initiated: 08/18/2016 Target Resolution Date: 10/29/2016 Goal Status: Active Interventions: Assess patient nutrition upon admission and  as needed per policy Notes: ` Orientation to the Wound Care Program [place], [place] ([id number]) Nursing Diagnoses: Knowledge deficit related to the wound healing center program Goals: Patient/caregiver  will verbalize understanding of the Wound Healing Center Program Date Initiated: 08/18/2016 Target Resolution Date: 10/29/2016 Goal Status: Active Interventions: Provide education on orientation to the wound center Notes: ` Wound/Skin Impairment Nursing Diagnoses: Impaired tissue integrity Goals: Patient/caregiver will verbalize understanding of skin care regimen Date Initiated: 08/18/2016 Target Resolution Date: 10/29/2016 Goal Status: Active Ulcer/skin breakdown will have a volume reduction of 30% by week 4 Date Initiated: 08/18/2016 Target Resolution Date: 10/29/2016 Goal Status: Active Ulcer/skin breakdown will have a volume reduction of 50% by week 8 Date Initiated: 08/18/2016 Target Resolution Date: 10/29/2016 Goal Status: Active Ulcer/skin breakdown will have a volume reduction of 80% by week 12 Date Initiated: 08/18/2016 Target Resolution Date: 10/29/2016 Goal Status: Active Ulcer/skin breakdown will heal within 14 weeks Date Initiated: 08/18/2016 Target Resolution Date: 10/29/2016 Goal Status: Active Interventions: Assess patient/caregiver ability to obtain necessary supplies Assess patient/caregiver ability to perform ulcer/skin care regimen upon admission and as needed Assess ulceration(s) every visit Notes: Electronic Signature(s) Signed: 08/25/2016 4:53:48 PM By: Charlotta Newton, [name] ([id number]) Entered By: Curtis Sites on 08/25/2016 11:18:02 Klostermann, Aliveah ([id number]) -------------------------------------------------------------------------------- Pain Assessment Details Patient Name: [name] Date of Service: 08/25/2016 11:00 AM Medical Record Patient Account Number: [id number] Number: Treating RN: Curtis Sites [birth year] (80 y.o. Other Clinician: Date of Birth/Sex: Female) Treating ROBSON, [name] Primary Care Mixtli Reno: Duncan Dull Ilay Capshaw/Extender: G Referring Tema Alire: Denton Brick in Treatment: 1 Active Problems Location of  Pain Severity and Description of Pain Patient Has Paino Yes Site Locations Pain Location: Pain in Ulcers Character of Pain Describe the Pain: Other: pressure when walking Pain Management and Medication Current Pain Management: Notes Topical or injectable lidocaine is offered to patient for acute pain when surgical debridement is performed. If needed, Patient is instructed to use over the counter pain medication for the following 24-48 hours after debridement. Wound care MDs do not prescribed pain medications. Patient has chronic pain or uncontrolled pain. Patient has been instructed to make an appointment with their Primary Care Physician for pain management. Electronic Signature(s) Signed: 08/25/2016 4:53:48 PM By: Curtis Sites Entered By: Curtis Sites on 08/25/2016 11:03:18 [name], [name] ([id number]) -------------------------------------------------------------------------------- Patient/Caregiver Education Details Patient Name: [name] Date of Service: 08/25/2016 11:00 AM Medical Record Patient Account Number: [id number] Number: Treating RN: Curtis Sites 15-Jul-1935 (80 y.o. Other Clinician: Date of Birth/Gender: Female) Treating ROBSON, [name] Primary Care Physician/Extender: [place] Benedict Physician: Tania Ade in Treatment: 1 Referring Physician: Duncan Dull Education Assessment Education Provided To: Patient and Caregiver nurses via written orders Education Topics Provided Wound/Skin Impairment: Handouts: Other: wound care as ordered Methods: Clinical cytogeneticist) Signed: 08/25/2016 4:53:48 PM By: Curtis Sites Entered By: Curtis Sites on 08/25/2016 11:22:49 [name], [name] ([id number]) -------------------------------------------------------------------------------- Wound Assessment Details Patient Name: [name] Date of Service: 08/25/2016 11:00 AM Medical Record Patient Account Number: [id number] Number: Treating RN:  Curtis Sites 04-Aug-1935 (80 y.o. Other Clinician: Date of Birth/Sex: Female) Treating ROBSON, [name] Primary Care Jamarian Jacinto: Duncan Dull Verina Galeno/Extender: G Referring Lexington Devine: Denton Brick in Treatment: 1 Wound Status Wound Number: 1 Primary Pressure Ulcer Etiology: Wound Location: Right Calcaneus Wound Open Wounding Event: Pressure Injury Status: Date Acquired: 07/05/2016 Comorbid Cataracts, Anemia, Congestive Heart Weeks Of Treatment: 1 History: Failure, Hypertension, Type II Diabetes, Clustered Wound: No Gout, Dementia, Neuropathy Photos Photo Uploaded By: Curtis Sites on 08/25/2016 15:00:57 Wound Measurements  Length: (cm) 1.9 Width: (cm) 3.4 Depth: (cm) 0.4 Area: (cm) 5.074 Volume: (cm) 2.029 % Reduction in Area: 28.2% % Reduction in Volume: -43.5% Epithelialization: None Tunneling: No Undermining: No Wound Description Classification: Category/Stage III Foul Odor Afte Diabetic Severity (Wagner): Grade 1 Due to Product Wound Margin: Flat and Intact Slough/Fibrino Exudate Amount: Large Exudate Type: Serous Exudate Color: amber r Cleansing: Yes Use: No No Wound Bed Granulation Amount: Medium (34-66%) Exposed Structure [name], [name] ([id number]) Granulation Quality: Pink Fascia Exposed: No Necrotic Amount: Medium (34-66%) Fat Layer (Subcutaneous Tissue) Exposed: No Necrotic Quality: Eschar, Adherent Slough Tendon Exposed: No Muscle Exposed: No Joint Exposed: No Bone Exposed: No Limited to Skin Breakdown Periwound Skin Texture Texture Color No Abnormalities Noted: No No Abnormalities Noted: No Callus: No Atrophie Blanche: No Crepitus: No Cyanosis: No Excoriation: No Ecchymosis: No Induration: No Erythema: No Rash: No Hemosiderin Staining: No Scarring: No Mottled: No Pallor: No Moisture Rubor: No No Abnormalities Noted: No Dry / Scaly: No Temperature / Pain Maceration: No Temperature: No Abnormality Tenderness on  Palpation: Yes Wound Preparation Ulcer Cleansing: Rinsed/Irrigated with Saline Topical Anesthetic Applied: Other: lidocaine 4%, Treatment Notes Wound #1 (Right Calcaneus) 1. Cleansed with: Clean wound with Normal Saline 2. Anesthetic Topical Lidocaine 4% cream to wound bed prior to debridement 4. Dressing Applied: Santyl Ointment 5. Secondary Dressing Applied Foam Gauze and Kerlix/Conform 7. Secured with Secretary/administrator) Signed: 08/25/2016 4:53:48 PM By: Curtis Sites Entered By: Curtis Sites on 08/25/2016 11:17:12 [name], [name] ([id number]) -------------------------------------------------------------------------------- Wound Assessment Details Patient Name: [name] Date of Service: 08/25/2016 11:00 AM Medical Record Patient Account Number: [id number] Number: Treating RN: Curtis Sites 10-04-1935 (80 y.o. Other Clinician: Date of Birth/Sex: Female) Treating ROBSON, [name] Primary Care Davonn Flanery: Duncan Dull Johnwesley Lederman/Extender: G Referring Nadra Hritz: Denton Brick in Treatment: 1 Wound Status Wound Number: 2 Primary Pressure Ulcer Etiology: Wound Location: Left Calcaneus Wound Open Wounding Event: Pressure Injury Status: Date Acquired: 07/05/2016 Comorbid Cataracts, Anemia, Congestive Heart Weeks Of Treatment: 1 History: Failure, Hypertension, Type II Diabetes, Clustered Wound: No Gout, Dementia, Neuropathy Photos Photo Uploaded By: Curtis Sites on 08/25/2016 15:00:58 Wound Measurements Length: (cm) 3.3 Width: (cm) 4 Depth: (cm) 0.2 Area: (cm) 10.367 Volume: (cm) 2.073 % Reduction in Area: 5.7% % Reduction in Volume: 5.7% Epithelialization: None Tunneling: No Undermining: No Wound Description Classification: Category/Stage III Foul Odor Afte Diabetic Severity (Wagner): Grade 1 Due to Product Wound Margin: Flat and Intact Slough/Fibrino Exudate Amount: Large Exudate Type: Serous Exudate Color: amber r  Cleansing: Yes Use: No No Wound Bed Granulation Amount: Large (67-100%) Exposed Structure [name], [name] ([id number]) Granulation Quality: Pink Fascia Exposed: No Necrotic Amount: Small (1-33%) Fat Layer (Subcutaneous Tissue) Exposed: No Necrotic Quality: Eschar, Adherent Slough Tendon Exposed: No Muscle Exposed: No Joint Exposed: No Bone Exposed: No Limited to Skin Breakdown Periwound Skin Texture Texture Color No Abnormalities Noted: No No Abnormalities Noted: No Callus: No Atrophie Blanche: No Crepitus: No Cyanosis: No Excoriation: No Ecchymosis: No Induration: No Erythema: No Rash: No Hemosiderin Staining: No Scarring: No Mottled: No Pallor: No Moisture Rubor: No No Abnormalities Noted: No Dry / Scaly: No Temperature / Pain Maceration: No Temperature: No Abnormality Tenderness on Palpation: Yes Wound Preparation Ulcer Cleansing: Rinsed/Irrigated with Saline Topical Anesthetic Applied: Other: lidocaine 4%, Treatment Notes Wound #2 (Left Calcaneus) 1. Cleansed with: Clean wound with Normal Saline 2. Anesthetic Topical Lidocaine 4% cream to wound bed prior to debridement 4. Dressing Applied: Santyl Ointment 5. Secondary Dressing Applied Foam Gauze and Kerlix/Conform 7. Secured with  Tape Electronic Signature(s) Signed: 08/25/2016 4:53:48 PM By: Curtis Sitesorthy, Joanna Entered By: Curtis Sitesorthy, Joanna on 08/25/2016 11:17:27 [name], [name] ([id number]) -------------------------------------------------------------------------------- Vitals Details Patient Name: [name] LoboHEELY, [name] Date of Service: 08/25/2016 11:00 AM Medical Record Patient Account Number: [id number] Number: Treating RN: Curtis SitesDorthy, Joanna 15-Mar-1936 (80 y.o. Other Clinician: Date of Birth/Sex: Female) Treating ROBSON, [name] Primary Care Farris Geiman: Duncan Dullullo, Teresa Kayton Ripp/Extender: G Referring Hannahgrace Lalli: Denton Brickullo, Teresa Weeks in Treatment: 1 Vital Signs Time Taken: 11:04 Temperature (F):  97.4 Height (in): 66 Pulse (bpm): 77 Weight (lbs): 140.8 Respiratory Rate (breaths/min): 16 Body Mass Index (BMI): 22.7 Blood Pressure (mmHg): 118/37 Reference Range: 80 - 120 mg / dl Electronic Signature(s) Signed: 08/25/2016 4:53:48 PM By: Curtis Sitesorthy, Joanna Entered By: Curtis Sitesorthy, Joanna on 08/25/2016 11:05:36

## 2016-09-01 ENCOUNTER — Encounter: Payer: Medicare (Managed Care) | Admitting: Internal Medicine

## 2016-09-01 DIAGNOSIS — E11621 Type 2 diabetes mellitus with foot ulcer: Secondary | ICD-10-CM | POA: Diagnosis not present

## 2016-09-03 NOTE — Progress Notes (Signed)
[name], [name] Visit Report for 09/01/2016 Arrival Information Details Patient Name: [name], [name] Date of Service: 09/01/2016 12:30 PM Medical Record Patient Account Number: [id number] Number: Treating RN: Curtis Sites 07-15-1935 (81 y.o. Other Clinician: Date of Birth/Sex: Female) Treating ROBSON, [name] Primary Care Khalidah Herbold: Duncan Dull Wilver Tignor/Extender: G Referring Tanyiah Laurich: Denton Brick in Treatment: 2 Visit Information History Since Last Visit Added or deleted any medications: No Patient Arrived: Walker Any new allergies or adverse reactions: No Arrival Time: 12:34 Had a fall or experienced change in No Accompanied By: self activities of daily living that may affect Transfer Assistance: None risk of falls: Patient Identification Verified: Yes Signs or symptoms of abuse/neglect since last No Secondary Verification Process Completed: Yes visito Patient Requires Transmission-Based No Hospitalized since last visit: No Precautions: Has Dressing in Place as Prescribed: Yes Patient Has Alerts: Yes Pain Present Now: No Patient Alerts: DM II Electronic Signature(s) Signed: 09/01/2016 5:11:41 PM By: Curtis Sites Entered By: Curtis Sites on 09/01/2016 12:37:47 [name], [name] ([id number]) -------------------------------------------------------------------------------- Encounter Discharge Information Details Patient Name: [name] Date of Service: 09/01/2016 12:30 PM Medical Record Patient Account Number: [id number] Number: Treating RN: Curtis Sites 1935-07-16 (81 y.o. Other Clinician: Date of Birth/Sex: Female) Treating ROBSON, [name] Primary Care Alfie Alderfer: Duncan Dull [place] Perra/Extender: G Referring Koran Seabrook: Denton Brick in Treatment: 2 Encounter Discharge Information Items Discharge Pain Level: 0 Discharge Condition: Stable Ambulatory Status: Walker Nursing Discharge Destination: Home Transportation:  Private Auto Accompanied By: dtr Schedule Follow-up Appointment: Yes Medication Reconciliation completed No and provided to Patient/Care Lulamae Skorupski: Provided on Clinical Summary of Care: 09/01/2016 Form Type Recipient Paper Patient [facility] Electronic Signature(s) Signed: 09/01/2016 5:11:41 PM By: Curtis Sites Previous Signature: 09/01/2016 1:24:54 PM Version By: Gwenlyn Perking Entered By: Curtis Sites on 09/01/2016 13:29:10 [name], [name] ([id number]) -------------------------------------------------------------------------------- Lower Extremity Assessment Details Patient Name: [name] Date of Service: 09/01/2016 12:30 PM Medical Record Patient Account Number: [id number] Number: Treating RN: Curtis Sites 11/02/1935 (81 y.o. Other Clinician: Date of Birth/Sex: Female) Treating ROBSON, [name] Primary Care Sheryn Aldaz: Duncan Dull Pamila Mendibles/Extender: G Referring Lakindra Wible: Denton Brick in Treatment: 2 Vascular Assessment Pulses: Dorsalis Pedis Palpable: [Left:Yes] [Right:Yes] Posterior Tibial Extremity colors, hair growth, and conditions: Extremity Color: [Left:Hyperpigmented] [Right:Hyperpigmented] Hair Growth on Extremity: [Left:No] [Right:No] Temperature of Extremity: [Left:Warm] [Right:Warm] Capillary Refill: [Left:< 3 seconds] [Right:< 3 seconds] Electronic Signature(s) Signed: 09/01/2016 5:11:41 PM By: Curtis Sites Entered By: Curtis Sites on 09/01/2016 12:49:07 [name], [name] ([id number]) -------------------------------------------------------------------------------- Multi Wound Chart Details Patient Name: [name] Date of Service: 09/01/2016 12:30 PM Medical Record Patient Account Number: [id number] Number: Treating RN: Curtis Sites 1936-04-24 (81 y.o. Other Clinician: Date of Birth/Sex: Female) Treating ROBSON, [name] Primary Care Kasee Hantz: Duncan Dull Johnell Bas/Extender: G Referring Elesa Garman: Denton Brick in  Treatment: 2 Vital Signs Height(in): 66 Pulse(bpm): 61 Weight(lbs): 140.8 Blood Pressure 118/53 (mmHg): Body Mass Index(BMI): 23 Temperature(F): 97.5 Respiratory Rate 16 (breaths/min): Photos: [1:No Photos] [2:No Photos] [N/A:N/A] Wound Location: [1:Right Calcaneus] [2:Left Calcaneus] [N/A:N/A] Wounding Event: [1:Pressure Injury] [2:Pressure Injury] [N/A:N/A] Primary Etiology: [1:Pressure Ulcer] [2:Pressure Ulcer] [N/A:N/A] Comorbid History: [1:Cataracts, Anemia, Congestive Heart Failure, Congestive Heart Failure, Hypertension, Type II Diabetes, Gout, Dementia, Diabetes, Gout, Dementia, Neuropathy] [2:Cataracts, Anemia, Hypertension, Type II Neuropathy] [N/A:N/A] Date Acquired: [1:07/05/2016] [2:07/05/2016] [N/A:N/A] Weeks of Treatment: [1:2] [2:2] [N/A:N/A] Wound Status: [1:Open] [2:Open] [N/A:N/A] Measurements L x W x D 1.8x3.2x0.3 [2:2.7x3.7x0.2] [N/A:N/A] (cm) Area (cm) : [1:4.524] [2:7.846] [N/A:N/A] Volume (cm) : [1:1.357] [2:1.569] [N/A:N/A] % Reduction in Area: [1:36.00%] [2:28.60%] [N/A:N/A] % Reduction in Volume: 4.00% [  2:28.60%] [N/A:N/A] Classification: [1:Category/Stage III] [2:Category/Stage III] [N/A:N/A] HBO Classification: [1:Grade 1] [2:Grade 1] [N/A:N/A] Exudate Amount: [1:Large] [2:Large] [N/A:N/A] Exudate Type: [1:Serous] [2:Serous] [N/A:N/A] Exudate Color: [1:amber] [2:amber] [N/A:N/A] Foul Odor After [1:Yes] [2:Yes] [N/A:N/A] Cleansing: Odor Anticipated Due to No [2:No] [N/A:N/A] Product Use: Wound Margin: [1:Flat and Intact] [2:Flat and Intact] [N/A:N/A] Granulation Amount: Medium (34-66%) Large (67-100%) N/A Granulation Quality: Pink Pink N/A Necrotic Amount: Medium (34-66%) Small (1-33%) N/A Necrotic Tissue: Eschar, Adherent Slough Eschar, Adherent Slough N/A Exposed Structures: Fascia: No Fascia: No N/A Fat Layer (Subcutaneous Fat Layer (Subcutaneous Tissue) Exposed: No Tissue) Exposed: No Tendon: No Tendon: No Muscle: No Muscle:  No Joint: No Joint: No Bone: No Bone: No Limited to Skin Limited to Skin Breakdown Breakdown Epithelialization: None None N/A Debridement: Debridement (32440- Debridement (10272- N/A 11047) 11047) Pre-procedure 12:57 13:00 N/A Verification/Time Out Taken: Pain Control: Lidocaine 4% Topical Lidocaine 4% Topical N/A Solution Solution Tissue Debrided: Necrotic/Eschar, Necrotic/Eschar, N/A Fibrin/Slough, Fibrin/Slough, Subcutaneous Subcutaneous Level: Skin/Subcutaneous Skin/Subcutaneous N/A Tissue Tissue Debridement Area (sq 5.76 9.99 N/A cm): Instrument: Curette Curette N/A Bleeding: Minimum Minimum N/A Hemostasis Achieved: Pressure Pressure N/A Procedural Pain: 0 0 N/A Post Procedural Pain: 0 0 N/A Debridement Treatment Procedure was tolerated Procedure was tolerated N/A Response: well well Post Debridement 1.8x3.2x0.4 2.7x3.7x0.3 N/A Measurements L x W x D (cm) Post Debridement 1.81 2.354 N/A Volume: (cm) Post Debridement Category/Stage III Category/Stage III N/A Stage: Periwound Skin Texture: Excoriation: No Excoriation: No N/A Induration: No Induration: No Callus: No Callus: No Crepitus: No Crepitus: No Rash: No Rash: No Scarring: No Scarring: No Periwound Skin Maceration: No Maceration: No N/A Moisture: Dry/Scaly: No Dry/Scaly: No [name], [name] ([id number]) Periwound Skin Color: Atrophie Blanche: No Atrophie Blanche: No N/A Cyanosis: No Cyanosis: No Ecchymosis: No Ecchymosis: No Erythema: No Erythema: No Hemosiderin Staining: No Hemosiderin Staining: No Mottled: No Mottled: No Pallor: No Pallor: No Rubor: No Rubor: No Temperature: No Abnormality No Abnormality N/A Tenderness on Yes Yes N/A Palpation: Wound Preparation: Ulcer Cleansing: Ulcer Cleansing: N/A Rinsed/Irrigated with Rinsed/Irrigated with Saline Saline Topical Anesthetic Topical Anesthetic Applied: Other: lidocaine Applied: Other: lidocaine 4% 4% Procedures Performed:  Debridement Debridement N/A Treatment Notes Wound #1 (Right Calcaneus) 1. Cleansed with: Clean wound with Normal Saline 2. Anesthetic Topical Lidocaine 4% cream to wound bed prior to debridement 4. Dressing Applied: Santyl Ointment 5. Secondary Dressing Applied Foam Gauze and Kerlix/Conform 7. Secured with Tape Wound #2 (Left Calcaneus) 1. Cleansed with: Clean wound with Normal Saline 2. Anesthetic Topical Lidocaine 4% cream to wound bed prior to debridement 4. Dressing Applied: Santyl Ointment 5. Secondary Dressing Applied Foam Gauze and Kerlix/Conform 7. Secured with Tape [name], [name] ([id number]) Electronic Signature(s) Signed: 09/01/2016 5:27:22 PM By: Baltazar Najjar MD Entered By: Baltazar Najjar on 09/01/2016 13:24:05 [name], [name] ([id number]) -------------------------------------------------------------------------------- Multi-Disciplinary Care Plan Details Patient Name: [name] Date of Service: 09/01/2016 12:30 PM Medical Record Patient Account Number: [id number] Number: Treating RN: Curtis Sites 03-Sep-1935 (80 y.o. Other Clinician: Date of Birth/Sex: Female) Treating ROBSON, [name] Primary Care Machele Deihl: Duncan Dull Jamicheal Heard/Extender: G Referring Zanyiah Posten: Denton Brick in Treatment: 2 Active Inactive ` Abuse / Safety / Falls / Self Care Management Nursing Diagnoses: Impaired physical mobility Potential for falls Goals: Patient will remain injury free Date Initiated: 08/18/2016 Target Resolution Date: 10/29/2016 Goal Status: Active Interventions: Assess fall risk on admission and as needed Notes: ` Nutrition Nursing Diagnoses: Potential for alteratiion in Nutrition/Potential for imbalanced nutrition Goals: Patient/caregiver agrees to and verbalizes understanding of need to use nutritional supplements  and/or vitamins as prescribed Date Initiated: 08/18/2016 Target Resolution Date: 10/29/2016 Goal Status:  Active Interventions: Assess patient nutrition upon admission and as needed per policy Notes: ` Orientation to the Wound Care Program [place], [place] ([id number]) Nursing Diagnoses: Knowledge deficit related to the wound healing center program Goals: Patient/caregiver will verbalize understanding of the Wound Healing Center Program Date Initiated: 08/18/2016 Target Resolution Date: 10/29/2016 Goal Status: Active Interventions: Provide education on orientation to the wound center Notes: ` Wound/Skin Impairment Nursing Diagnoses: Impaired tissue integrity Goals: Patient/caregiver will verbalize understanding of skin care regimen Date Initiated: 08/18/2016 Target Resolution Date: 10/29/2016 Goal Status: Active Ulcer/skin breakdown will have a volume reduction of 30% by week 4 Date Initiated: 08/18/2016 Target Resolution Date: 10/29/2016 Goal Status: Active Ulcer/skin breakdown will have a volume reduction of 50% by week 8 Date Initiated: 08/18/2016 Target Resolution Date: 10/29/2016 Goal Status: Active Ulcer/skin breakdown will have a volume reduction of 80% by week 12 Date Initiated: 08/18/2016 Target Resolution Date: 10/29/2016 Goal Status: Active Ulcer/skin breakdown will heal within 14 weeks Date Initiated: 08/18/2016 Target Resolution Date: 10/29/2016 Goal Status: Active Interventions: Assess patient/caregiver ability to obtain necessary supplies Assess patient/caregiver ability to perform ulcer/skin care regimen upon admission and as needed Assess ulceration(s) every visit Notes: Electronic Signature(s) Signed: 09/01/2016 5:11:41 PM By: [name], [name] ([id number]) Entered By: Curtis Sites on 09/01/2016 12:49:21 [name], [name] ([id number]) -------------------------------------------------------------------------------- Pain Assessment Details Patient Name: [name] Date of Service: 09/01/2016 12:30 PM Medical Record Patient Account Number:  [id number] Number: Treating RN: Curtis Sites 1935-11-06 (80 y.o. Other Clinician: Date of Birth/Sex: Female) Treating ROBSON, [name] Primary Care Jonte Wollam: Duncan Dull [place] Coll/Extender: G Referring Marabelle Cushman: Denton Brick in Treatment: 2 Active Problems Location of Pain Severity and Description of Pain Patient Has Paino No Site Locations Pain Management and Medication Current Pain Management: Notes Topical or injectable lidocaine is offered to patient for acute pain when surgical debridement is performed. If needed, Patient is instructed to use over the counter pain medication for the following 24-48 hours after debridement. Wound care MDs do not prescribed pain medications. Patient has chronic pain or uncontrolled pain. Patient has been instructed to make an appointment with their Primary Care Physician for pain management. Electronic Signature(s) Signed: 09/01/2016 5:11:41 PM By: Curtis Sites Entered By: Curtis Sites on 09/01/2016 12:37:54 [name], [name] ([id number]) -------------------------------------------------------------------------------- Patient/Caregiver Education Details Patient Name: [name] Date of Service: 09/01/2016 12:30 PM Medical Record Patient Account Number: [id number] Number: Treating RN: Curtis Sites [birth year] (80 y.o. Other Clinician: Date of Birth/Gender: Female) Treating ROBSON, [name] Primary Care Physician/Extender: Virgil Benedict Physician: Tania Ade in Treatment: 2 Referring Physician: Duncan Dull Education Assessment Education Provided To: Patient and Caregiver Education Topics Provided Wound/Skin Impairment: Handouts: Other: wound care as ordered Methods: Demonstration, Explain/Verbal Responses: State content correctly Electronic Signature(s) Signed: 09/01/2016 5:11:41 PM By: Curtis Sites Entered By: Curtis Sites on 09/01/2016 13:29:58 [name], [name]  ([id number]) -------------------------------------------------------------------------------- Wound Assessment Details Patient Name: [name] Date of Service: 09/01/2016 12:30 PM Medical Record Patient Account Number: [id number] Number: Treating RN: Curtis Sites July 23, 1935 (80 y.o. Other Clinician: Date of Birth/Sex: Female) Treating ROBSON, [name] Primary Care Kagan Hietpas: Duncan Dull Tauri Ethington/Extender: G Referring Alaysiah Browder: Denton Brick in Treatment: 2 Wound Status Wound Number: 1 Primary Pressure Ulcer Etiology: Wound Location: Right Calcaneus Wound Open Wounding Event: Pressure Injury Status: Date Acquired: 07/05/2016 Comorbid Cataracts, Anemia, Congestive Heart Weeks Of Treatment: 2 History: Failure, Hypertension, Type II Diabetes, Clustered Wound: No Gout, Dementia, Neuropathy Photos Photo Uploaded By:  Curtis Sites on 09/01/2016 17:09:28 Wound Measurements Length: (cm) 1.8 Width: (cm) 3.2 Depth: (cm) 0.3 Area: (cm) 4.524 Volume: (cm) 1.357 % Reduction in Area: 36% % Reduction in Volume: 4% Epithelialization: None Tunneling: No Undermining: No Wound Description Classification: Category/Stage III Foul Odor Afte Diabetic Severity (Wagner): Grade 1 Due to Product Wound Margin: Flat and Intact Slough/Fibrino Exudate Amount: Large Exudate Type: Serous Exudate Color: amber r Cleansing: Yes Use: No No Wound Bed Granulation Amount: Medium (34-66%) Exposed Structure Ravelo, Marymargaret ([id number]) Granulation Quality: Pink Fascia Exposed: No Necrotic Amount: Medium (34-66%) Fat Layer (Subcutaneous Tissue) Exposed: No Necrotic Quality: Eschar, Adherent Slough Tendon Exposed: No Muscle Exposed: No Joint Exposed: No Bone Exposed: No Limited to Skin Breakdown Periwound Skin Texture Texture Color No Abnormalities Noted: No No Abnormalities Noted: No Callus: No Atrophie Blanche: No Crepitus: No Cyanosis: No Excoriation:  No Ecchymosis: No Induration: No Erythema: No Rash: No Hemosiderin Staining: No Scarring: No Mottled: No Pallor: No Moisture Rubor: No No Abnormalities Noted: No Dry / Scaly: No Temperature / Pain Maceration: No Temperature: No Abnormality Tenderness on Palpation: Yes Wound Preparation Ulcer Cleansing: Rinsed/Irrigated with Saline Topical Anesthetic Applied: Other: lidocaine 4%, Treatment Notes Wound #1 (Right Calcaneus) 1. Cleansed with: Clean wound with Normal Saline 2. Anesthetic Topical Lidocaine 4% cream to wound bed prior to debridement 4. Dressing Applied: Santyl Ointment 5. Secondary Dressing Applied Foam Gauze and Kerlix/Conform 7. Secured with Secretary/administrator) Signed: 09/01/2016 5:11:41 PM By: Curtis Sites Entered By: Curtis Sites on 09/01/2016 12:47:15 Powless, Katniss ([id number]) -------------------------------------------------------------------------------- Wound Assessment Details Patient Name: [name] Date of Service: 09/01/2016 12:30 PM Medical Record Patient Account Number: [id number] Number: Treating RN: Curtis Sites Feb 19, 1936 (80 y.o. Other Clinician: Date of Birth/Sex: Female) Treating ROBSON, [name] Primary Care Maciah Feeback: Duncan Dull Doreena Maulden/Extender: G Referring Kiannah Grunow: Denton Brick in Treatment: 2 Wound Status Wound Number: 2 Primary Pressure Ulcer Etiology: Wound Location: Left Calcaneus Wound Open Wounding Event: Pressure Injury Status: Date Acquired: 07/05/2016 Comorbid Cataracts, Anemia, Congestive Heart Weeks Of Treatment: 2 History: Failure, Hypertension, Type II Diabetes, Clustered Wound: No Gout, Dementia, Neuropathy Photos Photo Uploaded By: Curtis Sites on 09/01/2016 17:09:28 Wound Measurements Length: (cm) 2.7 Width: (cm) 3.7 Depth: (cm) 0.2 Area: (cm) 7.846 Volume: (cm) 1.569 % Reduction in Area: 28.6% % Reduction in Volume: 28.6% Epithelialization:  None Tunneling: No Undermining: No Wound Description Classification: Category/Stage III Foul Odor Afte Diabetic Severity (Wagner): Grade 1 Due to Product Wound Margin: Flat and Intact Slough/Fibrino Exudate Amount: Large Exudate Type: Serous Exudate Color: amber r Cleansing: Yes Use: No No Wound Bed Granulation Amount: Large (67-100%) Exposed Structure Jambor, Shya ([id number]) Granulation Quality: Pink Fascia Exposed: No Necrotic Amount: Small (1-33%) Fat Layer (Subcutaneous Tissue) Exposed: No Necrotic Quality: Eschar, Adherent Slough Tendon Exposed: No Muscle Exposed: No Joint Exposed: No Bone Exposed: No Limited to Skin Breakdown Periwound Skin Texture Texture Color No Abnormalities Noted: No No Abnormalities Noted: No Callus: No Atrophie Blanche: No Crepitus: No Cyanosis: No Excoriation: No Ecchymosis: No Induration: No Erythema: No Rash: No Hemosiderin Staining: No Scarring: No Mottled: No Pallor: No Moisture Rubor: No No Abnormalities Noted: No Dry / Scaly: No Temperature / Pain Maceration: No Temperature: No Abnormality Tenderness on Palpation: Yes Wound Preparation Ulcer Cleansing: Rinsed/Irrigated with Saline Topical Anesthetic Applied: Other: lidocaine 4%, Treatment Notes Wound #2 (Left Calcaneus) 1. Cleansed with: Clean wound with Normal Saline 2. Anesthetic Topical Lidocaine 4% cream to wound bed prior to debridement 4. Dressing Applied: Santyl Ointment 5. Secondary Dressing Applied  Foam Gauze and Kerlix/Conform 7. Secured with Secretary/administratorTape Electronic Signature(s) Signed: 09/01/2016 5:11:41 PM By: Curtis Sitesorthy, Joanna Entered By: Curtis Sitesorthy, Joanna on 09/01/2016 12:47:27 Statler, [place] ([id number]) -------------------------------------------------------------------------------- Vitals Details Patient Name: [name] LoboHEELY, Serine Date of Service: 09/01/2016 12:30 PM Medical Record Patient Account Number: [id number] Number: Treating RN: Curtis SitesDorthy,  Joanna 1935/10/10 (80 y.o. Other Clinician: Date of Birth/Sex: Female) Treating ROBSON, [name] Primary Care Manish Ruggiero: Duncan Dullullo, Teresa Breckin Zafar/Extender: G Referring Lakeisha Waldrop: Denton Brickullo, Teresa Weeks in Treatment: 2 Vital Signs Time Taken: 12:40 Temperature (F): 97.5 Height (in): 66 Pulse (bpm): 61 Weight (lbs): 140.8 Respiratory Rate (breaths/min): 16 Body Mass Index (BMI): 22.7 Blood Pressure (mmHg): 118/53 Reference Range: 80 - 120 mg / dl Electronic Signature(s) Signed: 09/01/2016 5:11:41 PM By: Curtis Sitesorthy, Joanna Entered By: Curtis Sitesorthy, Joanna on 09/01/2016 12:40:38

## 2016-09-03 NOTE — Progress Notes (Signed)
[name] ([id number]) Visit Report for 09/01/2016 Chief Complaint Document Details Patient Name: [name], DOBLER Date of Service: 09/01/2016 12:30 PM Medical Record Patient Account Number: [id number] Number: Treating RN: Curtis Sites [birth year] (81 y.o. Other Clinician: Date of Birth/Sex: Female) Treating Fareedah Mahler Primary Care Provider: Duncan Dull Provider/Extender: G Referring Provider: Denton Brick in Treatment: 2 Information Obtained from: Patient Chief Complaint 08/18/16; patient is here for review of bilateral heel ulcers Electronic Signature(s) Signed: 09/01/2016 5:27:22 PM By: Baltazar Najjar MD Entered By: Baltazar Najjar on 09/01/2016 13:24:27 Eberle, Arlisa ([id number]) -------------------------------------------------------------------------------- Debridement Details Patient Name: [name] Date of Service: 09/01/2016 12:30 PM Medical Record Patient Account Number: [id number] Number: Treating RN: Curtis Sites 1975/07/01 (81 y.o. Other Clinician: Date of Birth/Sex: Female) Treating Honest Vanleer Primary Care Provider: Duncan Dull Provider/Extender: G Referring Provider: Denton Brick in Treatment: 2 Debridement Performed for Wound #1 Right Calcaneus Assessment: Performed By: Physician Maxwell Caul, MD Debridement: Debridement Pre-procedure Yes - 12:57 Verification/Time Out Taken: Start Time: 12:57 Pain Control: Lidocaine 4% Topical Solution Level: Skin/Subcutaneous Tissue Total Area Debrided (L x 1.8 (cm) x 3.2 (cm) = 5.76 (cm) W): Tissue and other Viable, Non-Viable, Eschar, Fibrin/Slough, Subcutaneous material debrided: Instrument: Curette Bleeding: Minimum Hemostasis Achieved: Pressure End Time: 13:00 Procedural Pain: 0 Post Procedural Pain: 0 Response to Treatment: Procedure was tolerated well Post Debridement Measurements of Total Wound Length: (cm) 1.8 Stage: Category/Stage III Width:  (cm) 3.2 Depth: (cm) 0.4 Volume: (cm) 1.81 Character of Wound/Ulcer Post Improved Debridement: Severity of Tissue Post Fat layer exposed Debridement: Post Procedure Diagnosis Same as Pre-procedure Electronic Signature(s) Signed: 09/01/2016 5:11:41 PM By: Charlotta Newton, Oluwadarasimi ([id number]) Signed: 09/01/2016 5:27:22 PM By: Baltazar Najjar MD Entered By: Baltazar Najjar on 09/01/2016 13:24:13 Dedrick, Keeanna ([id number]) -------------------------------------------------------------------------------- Debridement Details Patient Name: [name] Date of Service: 09/01/2016 12:30 PM Medical Record Patient Account Number: [id number] Number: Treating RN: Curtis Sites January 15, 1976 (81 y.o. Other Clinician: Date of Birth/Sex: Female) Treating Micael Barb Primary Care Provider: Duncan Dull Provider/Extender: G Referring Provider: Denton Brick in Treatment: 2 Debridement Performed for Wound #2 Left Calcaneus Assessment: Performed By: Physician Maxwell Caul, MD Debridement: Debridement Pre-procedure Yes - 13:00 Verification/Time Out Taken: Start Time: 13:00 Pain Control: Lidocaine 4% Topical Solution Level: Skin/Subcutaneous Tissue Total Area Debrided (L x 2.7 (cm) x 3.7 (cm) = 9.99 (cm) W): Tissue and other Viable, Non-Viable, Eschar, Fibrin/Slough, Subcutaneous material debrided: Instrument: Curette Bleeding: Minimum Hemostasis Achieved: Pressure End Time: 13:02 Procedural Pain: 0 Post Procedural Pain: 0 Response to Treatment: Procedure was tolerated well Post Debridement Measurements of Total Wound Length: (cm) 2.7 Stage: Category/Stage III Width: (cm) 3.7 Depth: (cm) 0.3 Volume: (cm) 2.354 Character of Wound/Ulcer Post Improved Debridement: Severity of Tissue Post Fat layer exposed Debridement: Post Procedure Diagnosis Same as Pre-procedure Electronic Signature(s) Signed: 09/01/2016 5:11:41 PM By: Charlotta Newton,  Chablis ([id number]) Signed: 09/01/2016 5:27:22 PM By: Baltazar Najjar MD Entered By: Baltazar Najjar on 09/01/2016 13:24:20 Kowalski, Margarette ([id number]) -------------------------------------------------------------------------------- HPI Details Patient Name: [name] Date of Service: 09/01/2016 12:30 PM Medical Record Patient Account Number: [id number] Number: Treating RN: Curtis Sites May 29, 1975 (81 y.o. Other Clinician: Date of Birth/Sex: Female) Treating [name] Primary Care Provider: Duncan Dull Provider/Extender: G Referring Provider: Denton Brick in Treatment: 2 History of Present Illness HPI Description: 08/18/16; this is a 81 year old woman who is a type II diabetic not currently on any treatment. She is also listed as having Alzheimer's disease hypertension. She has a history  of chronic venous insufficiency with leg wounds in the past but no history of wounds in her feet. In terms of her diabetes at one point she was on insulin however she is no longer on any current treatment, her daughter who is providing most of the history is unaware of her hemoglobin A1c. She has stage IV chronic renal failure and follows with nephrology. The history provided by her daughter is that she has had a wound on the left heel perhaps dating back to last May/17. At that point she was in the hospital predominantly with psychiatric issues and was discharged [facility]. This was treated with some form of foam dressing and may have actually healed however she was readmitted to hospital in January with Escherichia coli sepsis felt to be secondary to a UTI. Since then she is in [facility]. Apparently this timeframe both the left heel and right heel reopened or at least the daughter became aware that they were both open. The exact timeframe isn't really certain Her ABIs in this clinic were 1.08 on the right 1.25 on the left. Looking through [employer] doesn't really provide any useful information. She did have venous ultrasounds in 2016 that did not show a DVT. I don't see a recent hemoglobin A1c 08/25/16; from last week we have been using Santyl to both heels. Apparently the nursing home didn't get an x-ray of both heels [[facility]] and the daughter states that there was "no injury to bone" but for some reason we haven't been able to get the official report from them. 09/01/16; continued improvement in both wounds on her bilateral heels using Santyl. X-ray report was negative for osteomyelitis Electronic Signature(s) Signed: 09/01/2016 5:27:22 PM By: Baltazar Najjar MD Entered By: Baltazar Najjar on 09/01/2016 13:25:03 Titzer, Adelyna ([id number]) -------------------------------------------------------------------------------- Physical Exam Details Patient Name: [name] Date of Service: 09/01/2016 12:30 PM Medical Record Patient Account Number: [id number] Number: Treating RN: Curtis Sites 06-22-1935 (80 y.o. Other Clinician: Date of Birth/Sex: Female) Treating Sevilla Murtagh Primary Care Provider: Duncan Dull Provider/Extender: G Referring Provider: Denton Brick in Treatment: 2 Constitutional Sitting or standing Blood Pressure is within target range for patient.. Pulse regular and within target range for patient.Marland Kitchen Respirations regular, non-labored and within target range.. Temperature is normal and within the target range for the patient.. Patient's appearance is neat and clean. Appears in no acute distress. Well nourished and well developed.. Eyes Conjunctivae have some redness. Mild discharge noted.. Cardiovascular Pedal pulses palpable and strong bilaterally.. Notes Wound exam the patient has bilateral heel wounds both stage III pressure areas. Both debrided with a #5 curet today of necrotic surface material. Post debridement the base of these wounds looks healthy. I will continue Santyl for  another week and then consider change to collagen Electronic Signature(s) Signed: 09/01/2016 5:27:22 PM By: Baltazar Najjar MD Entered By: Baltazar Najjar on 09/01/2016 13:26:15 [place] Falls, Korin ([id number]) -------------------------------------------------------------------------------- Physician Orders Details Patient Name: [name] Date of Service: 09/01/2016 12:30 PM Medical Record Patient Account Number: [id number] Number: Treating RN: Curtis Sites Aug 15, 1935 (80 y.o. Other Clinician: Date of Birth/Sex: Female) Treating Khris Jansson Primary Care Provider: Duncan Dull Provider/Extender: G Referring Provider: Denton Brick in Treatment: 2 Verbal / Phone Orders: No Diagnosis Coding Wound Cleansing Wound #1 Right Calcaneus o Clean wound with Normal Saline. o May Shower, gently pat wound dry prior to applying new dressing. Wound #2 Left Calcaneus o Clean wound with Normal Saline. o May Shower, gently pat  wound dry prior to applying new dressing. Anesthetic Wound #1 Right Calcaneus o Topical Lidocaine 4% cream applied to wound bed prior to debridement Wound #2 Left Calcaneus o Topical Lidocaine 4% cream applied to wound bed prior to debridement Primary Wound Dressing Wound #1 Right Calcaneus o Santyl Ointment Wound #2 Left Calcaneus o Santyl Ointment Secondary Dressing Wound #1 Right Calcaneus o Gauze and Kerlix/Conform o Foam - heel cups Wound #2 Left Calcaneus o Gauze and Kerlix/Conform o Foam - heel cups Dressing Change Frequency Wound #1 Right Calcaneus Dempsey, Lakeishia ([id number]) o Change dressing every day. Wound #2 Left Calcaneus o Change dressing every day. Follow-up Appointments Wound #1 Right Calcaneus o Return Appointment in 1 week. Wound #2 Left Calcaneus o Return Appointment in 1 week. Edema Control Wound #1 Right Calcaneus o Elevate legs to the level of the heart and pump ankles as often as  possible Wound #2 Left Calcaneus o Elevate legs to the level of the heart and pump ankles as often as possible Off-Loading Wound #1 Right Calcaneus o Turn and reposition every 2 hours o Other: - wear heel protector boots at al times in bed, float heels while in bed, Wound #2 Left Calcaneus o Turn and reposition every 2 hours o Other: - wear heel protector boots at al times in bed, float heels while in bed, Additional Orders / Instructions Wound #1 Right Calcaneus o Increase protein intake. o Other: - please add vitamin A, vitamin C and zinc supplements to your diet Wound #2 Left Calcaneus o Increase protein intake. o Other: - please add vitamin A, vitamin C and zinc supplements to your diet Electronic Signature(s) Signed: 09/01/2016 5:11:41 PM By: Curtis Sites Signed: 09/01/2016 5:27:22 PM By: Baltazar Najjar MD Entered By: Curtis Sites on 09/01/2016 12:58:41 Hebdon, Damiyah ([id number]) -------------------------------------------------------------------------------- Problem List Details Patient Name: [name] Date of Service: 09/01/2016 12:30 PM Medical Record Patient Account Number: [id number] Number: Treating RN: Curtis Sites [birth year] (80 y.o. Other Clinician: Date of Birth/Sex: Female) Treating Jairon Ripberger Primary Care Provider: Duncan Dull Provider/Extender: G Referring Provider: Denton Brick in Treatment: 2 Active Problems ICD-10 Encounter Code Description Active Date Diagnosis E11.621 Type 2 diabetes mellitus with foot ulcer 08/18/2016 Yes L89.623 Pressure ulcer of left heel, stage 3 08/18/2016 Yes L89.613 Pressure ulcer of right heel, stage 3 08/18/2016 Yes Inactive Problems Resolved Problems Electronic Signature(s) Signed: 09/01/2016 5:27:22 PM By: Baltazar Najjar MD Entered By: Baltazar Najjar on 09/01/2016 13:23:46 Renfrew, Shaquanda  ([id number]) -------------------------------------------------------------------------------- Progress Note Details Patient Name: [name] Date of Service: 09/01/2016 12:30 PM Medical Record Patient Account Number: [id number] Number: Treating RN: Curtis Sites 03-16-1936 (80 y.o. Other Clinician: Date of Birth/Sex: Female) Treating Rose Hegner Primary Care Provider: Duncan Dull Provider/Extender: G Referring Provider: Denton Brick in Treatment: 2 Subjective Chief Complaint Information obtained from Patient 08/18/16; patient is here for review of bilateral heel ulcers History of Present Illness (HPI) 08/18/16; this is a 81 year old woman who is a type II diabetic not currently on any treatment. She is also listed as having Alzheimer's disease hypertension. She has a history of chronic venous insufficiency with leg wounds in the past but no history of wounds in her feet. In terms of her diabetes at one point she was on insulin however she is no longer on any current treatment, her daughter who is providing most of the history is unaware of her hemoglobin A1c. She has stage IV chronic renal failure and follows with nephrology. The history provided by her  daughter is that she has had a wound on the left heel perhaps dating back to last May/17. At that point she was in the hospital predominantly with psychiatric issues and was discharged [facility]. This was treated with some form of foam dressing and may have actually healed however she was readmitted to hospital in January with Escherichia coli sepsis felt to be secondary to a UTI. Since then she is in [facility]. Apparently this timeframe both the left heel and right heel reopened or at least the daughter became aware that they were both open. The exact timeframe isn't really certain Her ABIs in this clinic were 1.08 on the right 1.25 on the left. Looking through [employer]  doesn't really provide any useful information. She did have venous ultrasounds in 2016 that did not show a DVT. I don't see a recent hemoglobin A1c 08/25/16; from last week we have been using Santyl to both heels. Apparently the nursing home didn't get an x-ray of both heels [[facility]] and the daughter states that there was "no injury to bone" but for some reason we haven't been able to get the official report from them. 09/01/16; continued improvement in both wounds on her bilateral heels using Santyl. X-ray report was negative for osteomyelitis Objective Constitutional Kincannon, Paola ([id number]) Sitting or standing Blood Pressure is within target range for patient.. Pulse regular and within target range for patient.Marland Kitchen Respirations regular, non-labored and within target range.. Temperature is normal and within the target range for the patient.. Patient's appearance is neat and clean. Appears in no acute distress. Well nourished and well developed.. Vitals Time Taken: 12:40 PM, Height: 66 in, Weight: 140.8 lbs, BMI: 22.7, Temperature: 97.5 F, Pulse: 61 bpm, Respiratory Rate: 16 breaths/min, Blood Pressure: 118/53 mmHg. Eyes Conjunctivae have some redness. Mild discharge noted.. Cardiovascular Pedal pulses palpable and strong bilaterally.. General Notes: Wound exam the patient has bilateral heel wounds both stage III pressure areas. Both debrided with a #5 curet today of necrotic surface material. Post debridement the base of these wounds looks healthy. I will continue Santyl for another week and then consider change to collagen Integumentary (Hair, Skin) Wound #1 status is Open. Original cause of wound was Pressure Injury. The wound is located on the Right Calcaneus. The wound measures 1.8cm length x 3.2cm width x 0.3cm depth; 4.524cm^2 area and 1.357cm^3 volume. The wound is limited to skin breakdown. There is no tunneling or undermining noted. There is a large amount of serous  drainage noted. The wound margin is flat and intact. There is medium (34-66%) pink granulation within the wound bed. There is a medium (34-66%) amount of necrotic tissue within the wound bed including Eschar and Adherent Slough. The periwound skin appearance did not exhibit: Callus, Crepitus, Excoriation, Induration, Rash, Scarring, Dry/Scaly, Maceration, Atrophie Blanche, Cyanosis, Ecchymosis, Hemosiderin Staining, Mottled, Pallor, Rubor, Erythema. Periwound temperature was noted as No Abnormality. The periwound has tenderness on palpation. Wound #2 status is Open. Original cause of wound was Pressure Injury. The wound is located on the Left Calcaneus. The wound measures 2.7cm length x 3.7cm width x 0.2cm depth; 7.846cm^2 area and 1.569cm^3 volume. The wound is limited to skin breakdown. There is no tunneling or undermining noted. There is a large amount of serous drainage noted. The wound margin is flat and intact. There is large (67- 100%) pink granulation within the wound bed. There is a small (1-33%) amount of necrotic tissue within the wound bed including Eschar and Adherent Slough.  The periwound skin appearance did not exhibit: Callus, Crepitus, Excoriation, Induration, Rash, Scarring, Dry/Scaly, Maceration, Atrophie Blanche, Cyanosis, Ecchymosis, Hemosiderin Staining, Mottled, Pallor, Rubor, Erythema. Periwound temperature was noted as No Abnormality. The periwound has tenderness on palpation. Assessment Active Problems ICD-10 E11.621 - Type 2 diabetes mellitus with foot ulcer Fails, Cordella ([id number]) W09.811 - Pressure ulcer of left heel, stage 3 L89.613 - Pressure ulcer of right heel, stage 3 Procedures Wound #1 Wound #1 is a Pressure Ulcer located on the Right Calcaneus . There was a Skin/Subcutaneous Tissue Debridement ([id number]) debridement with total area of 5.76 sq cm performed by Maxwell Caul, MD. with the following instrument(s): Curette to remove Viable and  Non-Viable tissue/material including Fibrin/Slough, Eschar, and Subcutaneous after achieving pain control using Lidocaine 4% Topical Solution. A time out was conducted at 12:57, prior to the start of the procedure. A Minimum amount of bleeding was controlled with Pressure. The procedure was tolerated well with a pain level of 0 throughout and a pain level of 0 following the procedure. Post Debridement Measurements: 1.8cm length x 3.2cm width x 0.4cm depth; 1.81cm^3 volume. Post debridement Stage noted as Category/Stage III. Character of Wound/Ulcer Post Debridement is improved. Severity of Tissue Post Debridement is: Fat layer exposed. Post procedure Diagnosis Wound #1: Same as Pre-Procedure Wound #2 Wound #2 is a Pressure Ulcer located on the Left Calcaneus . There was a Skin/Subcutaneous Tissue Debridement ([id number]) debridement with total area of 9.99 sq cm performed by Maxwell Caul, MD. with the following instrument(s): Curette to remove Viable and Non-Viable tissue/material including Fibrin/Slough, Eschar, and Subcutaneous after achieving pain control using Lidocaine 4% Topical Solution. A time out was conducted at 13:00, prior to the start of the procedure. A Minimum amount of bleeding was controlled with Pressure. The procedure was tolerated well with a pain level of 0 throughout and a pain level of 0 following the procedure. Post Debridement Measurements: 2.7cm length x 3.7cm width x 0.3cm depth; 2.354cm^3 volume. Post debridement Stage noted as Category/Stage III. Character of Wound/Ulcer Post Debridement is improved. Severity of Tissue Post Debridement is: Fat layer exposed. Post procedure Diagnosis Wound #2: Same as Pre-Procedure Plan Wound Cleansing: Wound #1 Right Calcaneus: Clean wound with Normal Saline. May Shower, gently pat wound dry prior to applying new dressing. Wound #2 Left Calcaneus: Clean wound with Normal Saline. Duane, Trias Kristiane ([id number]) May Shower,  gently pat wound dry prior to applying new dressing. Anesthetic: Wound #1 Right Calcaneus: Topical Lidocaine 4% cream applied to wound bed prior to debridement Wound #2 Left Calcaneus: Topical Lidocaine 4% cream applied to wound bed prior to debridement Primary Wound Dressing: Wound #1 Right Calcaneus: Santyl Ointment Wound #2 Left Calcaneus: Santyl Ointment Secondary Dressing: Wound #1 Right Calcaneus: Gauze and Kerlix/Conform Foam - heel cups Wound #2 Left Calcaneus: Gauze and Kerlix/Conform Foam - heel cups Dressing Change Frequency: Wound #1 Right Calcaneus: Change dressing every day. Wound #2 Left Calcaneus: Change dressing every day. Follow-up Appointments: Wound #1 Right Calcaneus: Return Appointment in 1 week. Wound #2 Left Calcaneus: Return Appointment in 1 week. Edema Control: Wound #1 Right Calcaneus: Elevate legs to the level of the heart and pump ankles as often as possible Wound #2 Left Calcaneus: Elevate legs to the level of the heart and pump ankles as often as possible Off-Loading: Wound #1 Right Calcaneus: Turn and reposition every 2 hours Other: - wear heel protector boots at al times in bed, float heels while in bed, Wound #2 Left Calcaneus: Turn and reposition  every 2 hours Other: - wear heel protector boots at al times in bed, float heels while in bed, Additional Orders / Instructions: Wound #1 Right Calcaneus: Increase protein intake. Other: - please add vitamin A, vitamin C and zinc supplements to your diet Wound #2 Left Calcaneus: Increase protein intake. Other: - please add vitamin A, vitamin C and zinc supplements to your diet Corlett, Jenesys ([id number]) #1 continue Santyl for another week. Consider change to Prisma or Hydrofera Blue #2 the patient wanders however neither wound is really on the plantar aspect of the heel which is fortunate. Electronic Signature(s) Signed: 09/01/2016 5:27:22 PM By: Baltazar Najjarobson, Nelva Hauk MD Entered By: Baltazar Najjarobson,  Quenesha Douglass on 09/01/2016 13:28:24 PaxicoHEELY, Rut ([id number]) -------------------------------------------------------------------------------- SuperBill Details Patient Name: [name] LoboHEELY, Yeraldine Date of Service: 09/01/2016 Medical Record Patient Account Number: [id number] Number: Treating RN: Curtis SitesDorthy, Joanna 1935-07-02 (80 y.o. Other Clinician: Date of Birth/Sex: Female) Treating Hennessey Cantrell Primary Care Provider: Duncan Dullullo, Teresa Provider/Extender: G Referring Provider: Denton Brickullo, Teresa Weeks in Treatment: 2 Diagnosis Coding ICD-10 Codes Code Description E11.621 Type 2 diabetes mellitus with foot ulcer L89.623 Pressure ulcer of left heel, stage 3 L89.613 Pressure ulcer of right heel, stage 3 Facility Procedures CPT4 Code: [id number] Description: 11042 - DEB SUBQ TISSUE 20 SQ CM/< ICD-10 Description Diagnosis L89.623 Pressure ulcer of left heel, stage 3 L89.613 Pressure ulcer of right heel, stage 3 Modifier: Quantity: 1 Physician Procedures CPT4 Code: [id number] Description: 11042 - WC PHYS SUBQ TISS 20 SQ CM ICD-10 Description Diagnosis L89.623 Pressure ulcer of left heel, stage 3 L89.613 Pressure ulcer of right heel, stage 3 Modifier: Quantity: 1 Electronic Signature(s) Signed: 09/01/2016 5:27:22 PM By: Baltazar Najjarobson, Norabelle Kondo MD Entered By: Baltazar Najjarobson, Teri Diltz on 09/01/2016 13:28:50

## 2016-09-08 ENCOUNTER — Encounter: Payer: Medicare (Managed Care) | Admitting: Internal Medicine

## 2016-09-08 DIAGNOSIS — E11621 Type 2 diabetes mellitus with foot ulcer: Secondary | ICD-10-CM | POA: Diagnosis not present

## 2016-09-10 NOTE — Progress Notes (Signed)
[name], [name] ([id number]) Visit Report for 09/08/2016 Chief Complaint Document Details Patient Name: [name], [name] Date of Service: 09/08/2016 2:30 PM Medical Record Patient Account Number: [id number] Number: Treating RN: Curtis SitesDorthy, Joanna June 05, 1935 (80 y.o. Other Clinician: Date of Birth/Sex: Female) Treating Brennan Litzinger Primary Care Provider: Duncan Dullullo, Teresa Provider/Extender: G Referring Provider: Denton Brickullo, Teresa Weeks in Treatment: 3 Information Obtained from: Patient Chief Complaint 08/18/16; patient is here for review of bilateral heel ulcers Electronic Signature(s) Signed: 09/08/2016 4:43:34 PM By: Baltazar Najjarobson, Sharmon Cheramie MD Entered By: Baltazar Najjarobson, Regan Llorente on 09/08/2016 15:13:12 Mcalpine, [name] ([id number]) -------------------------------------------------------------------------------- Debridement Details Patient Name: [name] LoboHEELY, [name] Date of Service: 09/08/2016 2:30 PM Medical Record Patient Account Number: [id number] Number: Treating RN: Curtis Sitesorthy, Joanna June 05, 1935 (80 y.o. Other Clinician: Date of Birth/Sex: Female) Treating [name] Primary Care Provider: Duncan Dullullo, Teresa Provider/Extender: G Referring Provider: Denton Brickullo, Teresa Weeks in Treatment: 3 Debridement Performed for Wound #1 Right Calcaneus Assessment: Performed By: Physician Maxwell CaulOBSON, Doyce Stonehouse G, MD Debridement: Debridement Pre-procedure Yes - 14:56 Verification/Time Out Taken: Start Time: 14:57 Pain Control: Lidocaine 4% Topical Solution Level: Skin/Subcutaneous Tissue Total Area Debrided (L x 1.4 (cm) x 3 (cm) = 4.2 (cm) W): Tissue and other Viable, Non-Viable, Exudate, Fibrin/Slough, Subcutaneous material debrided: Instrument: Curette Bleeding: Minimum Hemostasis Achieved: Pressure End Time: 14:58 Procedural Pain: 0 Post Procedural Pain: 0 Response to Treatment: Procedure was tolerated well Post Debridement Measurements of Total Wound Length: (cm) 1.4 Stage: Category/Stage III Width:  (cm) 3 Depth: (cm) 0.3 Volume: (cm) 0.99 Character of Wound/Ulcer Post Requires Further Debridement: Debridement Severity of Tissue Post Fat layer exposed Debridement: Post Procedure Diagnosis Same as Pre-procedure Electronic Signature(s) Signed: 09/08/2016 4:43:34 PM By: Baltazar Najjarobson, Karmyn Lowman MD WaterlooHEELY, [name] ([id number]) Signed: 09/08/2016 4:59:52 PM By: Curtis Sitesorthy, Joanna Entered By: Baltazar Najjarobson, Vennesa Bastedo on 09/08/2016 15:13:05 Jump, [name] ([id number]) -------------------------------------------------------------------------------- HPI Details Patient Name: [name] LoboHEELY, [name] Date of Service: 09/08/2016 2:30 PM Medical Record Patient Account Number: [id number] Number: Treating RN: Curtis Sitesorthy, Joanna June 05, 1935 (80 y.o. Other Clinician: Date of Birth/Sex: Female) Treating Tkai Large Primary Care Provider: Duncan Dullullo, Teresa Provider/Extender: G Referring Provider: Denton Brickullo, Teresa Weeks in Treatment: 3 History of Present Illness HPI Description: 08/18/16; this is a 81 year old woman who is a type II diabetic not currently on any treatment. She is also listed as having Alzheimer's disease hypertension. She has a history of chronic venous insufficiency with leg wounds in the past but no history of wounds in her feet. In terms of her diabetes at one point she was on insulin however she is no longer on any current treatment, her daughter who is providing most of the history is unaware of her hemoglobin A1c. She has stage IV chronic renal failure and follows with nephrology. The history provided by her daughter is that she has had a wound on the left heel perhaps dating back to last May/17. At that point she was in the hospital predominantly with psychiatric issues and was [facility]. This was treated with some form of foam dressing and may have actually healed however she was readmitted to hospital in January with Escherichia coli sepsis felt to be secondary to a UTI. Since then she  is in [facility] skilled facility. Apparently this timeframe both the left heel and right heel reopened or at least the daughter became aware that they were both open. The exact timeframe isn't really certain Her ABIs in this clinic were 1.08 on the right 1.25 on the left. [facility] really provide any useful [facility]. She did have venous  ultrasounds in 2016 that did not show a DVT. I don't see a recent hemoglobin A1c 08/25/16; from last week we have been using Santyl to both heels. Apparently the nursing home didn't get an x-ray of both heels [[facility]] and the daughter states that there was "no injury to bone" but for some reason we haven't been able to get the official report from them. 09/01/16; continued improvement in both wounds on her bilateral heels using Santyl. X-ray report was negative for osteomyelitis 09/08/16; patient from [facility] skilled facility. Using Santyl on both her heel wounds which are fortunately not on the plantar surface. Electronic Signature(s) Signed: 09/08/2016 4:43:34 PM By: Baltazar Najjar MD Entered By: Baltazar Najjar on 09/08/2016 15:14:02 [name], [name] -------------------------------------------------------------------------------- Physical Exam Details Patient Name: [name] Date of Service: 09/08/2016 2:30 PM Medical Record Patient Account Number: [id number] Number: Treating RN: Curtis Sites [birth year] (80 y.o. Other Clinician: Date of Birth/Sex: Female) Treating Yosmar Ryker Primary Care Provider: Duncan Dull Provider/Extender: G Referring Provider: Denton Brick in Treatment: 3 Constitutional Sitting or standing Blood Pressure is within target range for patient.. Pulse regular and within target range for patient.Marland Kitchen Respirations regular, non-labored and within target range.. Temperature is normal and within the target range for the patient.. Patient's  appearance is neat and clean. Appears in no acute distress. Well nourished and well developed.. Cardiovascular Pedal pulses palpable and strong bilaterally.. Notes Wound exam; the patient has bilateral heel wounds both stage III pressure areas. The area on the left had a healthy surface. No debridement was required. The area on the right is deeper and with that tightly adherent necrotic surface. Using a #3 curet this is a areas debrided but at least on the right this is still going to require [employer]) Signed: 09/08/2016 4:43:34 PM By: Baltazar Najjar MD Entered By: Baltazar Najjar on 09/08/2016 15:15:09 [name], [name] ([id number]) -------------------------------------------------------------------------------- Physician Orders Details Patient Name: [name] Date of Service: 09/08/2016 2:30 PM Medical Record Patient Account Number: [id number] Number: Treating RN: Phillis Haggis 1935/11/10 (80 y.o. Other Clinician: Date of Birth/Sex: Female) Treating Hattie Aguinaldo Primary Care Provider: Duncan Dull Provider/Extender: G Referring Provider: Denton Brick in Treatment: 3 Verbal / Phone Orders: Yes Clinician: Ashok Cordia, Debi Read Back and Verified: Yes Diagnosis Coding Wound Cleansing Wound #1 Right Calcaneus o Clean wound with Normal Saline. o May Shower, gently pat wound dry prior to applying new dressing. Wound #2 Left Calcaneus o Clean wound with Normal Saline. o May Shower, gently pat wound dry prior to applying new dressing. Anesthetic Wound #1 Right Calcaneus o Topical Lidocaine 4% cream applied to wound bed prior to debridement Wound #2 Left Calcaneus o Topical Lidocaine 4% cream applied to wound bed prior to debridement Primary Wound Dressing Wound #1 Right Calcaneus o Santyl Ointment Wound #2 Left Calcaneus o Santyl Ointment Secondary Dressing Wound #1 Right Calcaneus o Gauze and Kerlix/Conform o  Foam - heel cups Wound #2 Left Calcaneus o Gauze and Kerlix/Conform o Foam - heel cups Dressing Change Frequency Wound #1 Right Calcaneus Granquist, Laresha ([id number]) o Change dressing every day. Wound #2 Left Calcaneus o Change dressing every day. Follow-up Appointments Wound #1 Right Calcaneus o Return Appointment in 1 week. Wound #2 Left Calcaneus o Return Appointment in 1 week. Edema Control Wound #1 Right Calcaneus o Elevate legs to the level of the heart and pump ankles as often as possible Wound #2 Left Calcaneus o Elevate legs to the level of the heart and  pump ankles as often as possible Off-Loading Wound #1 Right Calcaneus o Turn and reposition every 2 hours o Other: - wear heel protector boots at al times in bed, float heels while in bed, Wound #2 Left Calcaneus o Turn and reposition every 2 hours o Other: - wear heel protector boots at al times in bed, float heels while in bed, Additional Orders / Instructions Wound #1 Right Calcaneus o Increase protein intake. o Other: - please add vitamin A, vitamin C and zinc supplements to your diet Wound #2 Left Calcaneus o Increase protein intake. o Other: - please add vitamin A, vitamin C and zinc supplements to your diet Electronic Signature(s) Signed: 09/08/2016 4:43:34 PM By: Baltazar Najjar MD Signed: 09/08/2016 5:09:08 PM By: Alejandro Mulling Entered By: Alejandro Mulling on 09/08/2016 14:59:24 [name], [name] -------------------------------------------------------------------------------- Problem List Details Patient Name: [name] Date of Service: 09/08/2016 2:30 PM Medical Record Patient Account Number: [id number] Number: Treating RN: Curtis Sites 01/18/1936 (80 y.o. Other Clinician: Date of Birth/Sex: Female) Treating Takao Lizer Primary Care Provider: Duncan Dull Provider/Extender: G Referring Provider: Denton Brick in Treatment:  3 Active Problems ICD-10 Encounter Code Description Active Date Diagnosis E11.621 Type 2 diabetes mellitus with foot ulcer 08/18/2016 Yes L89.623 Pressure ulcer of left heel, stage 3 08/18/2016 Yes L89.613 Pressure ulcer of right heel, stage 3 08/18/2016 Yes Inactive Problems Resolved Problems Electronic Signature(s) Signed: 09/08/2016 4:43:34 PM By: Baltazar Najjar MD Entered By: Baltazar Najjar on 09/08/2016 15:12:46 [name], [name] ([id number]) -------------------------------------------------------------------------------- Progress Note Details Patient Name: [name] Date of Service: 09/08/2016 2:30 PM Medical Record Patient Account Number: [id number] Number: Treating RN: Curtis Sites June 21, 1935 (80 y.o. Other Clinician: Date of Birth/Sex: Female) Treating Jaheem Hedgepath Primary Care Provider: Duncan Dull Provider/Extender: G Referring Provider: Denton Brick in Treatment: 3 Subjective Chief Complaint Information obtained from Patient 08/18/16; patient is here for review of bilateral heel ulcers History of Present Illness (HPI) 08/18/16; this is a 81 year old woman who is a type II diabetic not currently on any treatment. She is also listed as having Alzheimer's disease hypertension. She has a history of chronic venous insufficiency with leg wounds in the past but no history of wounds in her feet. In terms of her diabetes at one point she was on insulin however she is no longer on any current treatment, her daughter who is providing most of the history is unaware of her hemoglobin A1c. She has stage IV chronic renal failure and follows with nephrology. The history provided by her daughter is that she has had a wound on the left heel perhaps dating back to last May/17. At that point she was in the hospital predominantly with psychiatric issues and was discharged [facility]. This was treated with some form of foam dressing and may have actually healed  however she was readmitted to hospital in January with Escherichia coli sepsis felt to be secondary to a UTI. Since then she is in [facility] skilled [facility]. Apparently this timeframe both the left heel and right heel reopened or at least the daughter became aware that they were both open. The exact timeframe isn't really certain Her ABIs in this clinic were 1.08 on the right 1.25 on the left. Looking through [facility] doesn't really provide any useful information. She did have venous ultrasounds in 2016 that did not show a DVT. I don't see a recent hemoglobin A1c 08/25/16; from last week we have been using Santyl to both heels. Apparently the nursing home  didn't get an x-ray of both heels [[facility]] and the daughter states that there was "no injury to bone" but for some reason we haven't been able to get the official report from them. 09/01/16; continued improvement in both wounds on her bilateral heels using Santyl. X-ray report was negative for osteomyelitis 09/08/16; patient from [facility] skilled facility. Using Santyl on both her heel wounds which are fortunately not on the plantar surface. Objective [name], [name] ([id number]) Constitutional Sitting or standing Blood Pressure is within target range for patient.. Pulse regular and within target range for patient.Marland Kitchen Respirations regular, non-labored and within target range.. Temperature is normal and within the target range for the patient.. Patient's appearance is neat and clean. Appears in no acute distress. Well nourished and well developed.. Vitals Time Taken: 2:29 PM, Height: 66 in, Weight: 140.8 lbs, BMI: 22.7, Pulse: 67 bpm, Respiratory Rate: 16 breaths/min, Blood Pressure: 123/50 mmHg. Cardiovascular Pedal pulses palpable and strong bilaterally.. General Notes: Wound exam; the patient has bilateral heel wounds both stage III pressure areas. The area on the left had a healthy surface. No debridement was  required. The area on the right is deeper and with that tightly adherent necrotic surface. Using a #3 curet this is a areas debrided but at least on the right this is still going to require Santyl Integumentary (Hair, Skin) Wound #1 status is Open. Original cause of wound was Pressure Injury. The wound is located on the Right Calcaneus. The wound measures 1.4cm length x 3cm width x 0.3cm depth; 3.299cm^2 area and 0.99cm^3 volume. The wound is limited to skin breakdown. There is no tunneling or undermining noted. There is a large amount of serous drainage noted. The wound margin is flat and intact. There is medium (34-66%) pink granulation within the wound bed. There is a medium (34-66%) amount of necrotic tissue within the wound bed including Eschar and Adherent Slough. The periwound skin appearance did not exhibit: Callus, Crepitus, Excoriation, Induration, Rash, Scarring, Dry/Scaly, Maceration, Atrophie Blanche, Cyanosis, Ecchymosis, Hemosiderin Staining, Mottled, Pallor, Rubor, Erythema. Periwound temperature was noted as No Abnormality. The periwound has tenderness on palpation. Wound #2 status is Open. Original cause of wound was Pressure Injury. The wound is located on the Left Calcaneus. The wound measures 2.9cm length x 3.8cm width x 0.2cm depth; 8.655cm^2 area and 1.731cm^3 volume. The wound is limited to skin breakdown. There is no tunneling or undermining noted. There is a large amount of serous drainage noted. The wound margin is flat and intact. There is large (67- 100%) pink granulation within the wound bed. There is a small (1-33%) amount of necrotic tissue within the wound bed including Eschar and Adherent Slough. The periwound skin appearance did not exhibit: Callus, Crepitus, Excoriation, Induration, Rash, Scarring, Dry/Scaly, Maceration, Atrophie Blanche, Cyanosis, Ecchymosis, Hemosiderin Staining, Mottled, Pallor, Rubor, Erythema. Periwound temperature was noted as No  Abnormality. The periwound has tenderness on palpation. Assessment Active Problems ICD-10 E11.621 - Type 2 diabetes mellitus with foot ulcer [name], [name] ([id number]) H84.696 - Pressure ulcer of left heel, stage 3 L89.613 - Pressure ulcer of right heel, stage 3 Procedures Wound #1 Wound #1 is a Pressure Ulcer located on the Right Calcaneus . There was a Skin/Subcutaneous Tissue Debridement ([id number]) debridement with total area of 4.2 sq cm performed by Maxwell Caul, MD. with the following instrument(s): Curette to remove Viable and Non-Viable tissue/material including Exudate, Fibrin/Slough, and Subcutaneous after achieving pain control using Lidocaine 4% Topical Solution. A time out was conducted at 14:56,  prior to the start of the procedure. A Minimum amount of bleeding was controlled with Pressure. The procedure was tolerated well with a pain level of 0 throughout and a pain level of 0 following the procedure. Post Debridement Measurements: 1.4cm length x 3cm width x 0.3cm depth; 0.99cm^3 volume. Post debridement Stage noted as Category/Stage III. Character of Wound/Ulcer Post Debridement requires further debridement. Severity of Tissue Post Debridement is: Fat layer exposed. Post procedure Diagnosis Wound #1: Same as Pre-Procedure Plan Wound Cleansing: Wound #1 Right Calcaneus: Clean wound with Normal Saline. May Shower, gently pat wound dry prior to applying new dressing. Wound #2 Left Calcaneus: Clean wound with Normal Saline. May Shower, gently pat wound dry prior to applying new dressing. Anesthetic: Wound #1 Right Calcaneus: Topical Lidocaine 4% cream applied to wound bed prior to debridement Wound #2 Left Calcaneus: Topical Lidocaine 4% cream applied to wound bed prior to debridement Primary Wound Dressing: Wound #1 Right Calcaneus: Santyl Ointment Wound #2 Left Calcaneus: Santyl Ointment Secondary Dressing: Wound #1 Right Calcaneus: Gauze and  Kerlix/Conform [name], [name] Foam - heel cups Wound #2 Left Calcaneus: Gauze and Kerlix/Conform Foam - heel cups Dressing Change Frequency: Wound #1 Right Calcaneus: Change dressing every day. Wound #2 Left Calcaneus: Change dressing every day. Follow-up Appointments: Wound #1 Right Calcaneus: Return Appointment in 1 week. Wound #2 Left Calcaneus: Return Appointment in 1 week. Edema Control: Wound #1 Right Calcaneus: Elevate legs to the level of the heart and pump ankles as often as possible Wound #2 Left Calcaneus: Elevate legs to the level of the heart and pump ankles as often as possible Off-Loading: Wound #1 Right Calcaneus: Turn and reposition every 2 hours Other: - wear heel protector boots at al times in bed, float heels while in bed, Wound #2 Left Calcaneus: Turn and reposition every 2 hours Other: - wear heel protector boots at al times in bed, float heels while in bed, Additional Orders / Instructions: Wound #1 Right Calcaneus: Increase protein intake. Other: - please add vitamin A, vitamin C and zinc supplements to your diet Wound #2 Left Calcaneus: Increase protein intake. Other: - please add vitamin A, vitamin C and zinc supplements to your diet #1 continue Santyl ointment bilaterally, heel cups, Kerlix #2 I would like to change the wound dressing on the left to either [employer] and hydrofera a however I've been waiting for the right side to look is healthy as the left side before I do that. Electronic Signature(s) Signed: 09/08/2016 4:43:34 PM By: Baltazar Najjar MD Entered By: Baltazar Najjar on 09/08/2016 15:16:17 [place], [name] ([id number]) [name], [name] ([id number]) -------------------------------------------------------------------------------- SuperBill Details Patient Name: [name] Date of Service: 09/08/2016 Medical Record Patient Account Number: [id number] Number: Treating RN: Curtis Sites 1935-10-30 (80 y.o.  Other Clinician: Date of Birth/Sex: Female) Treating Syed Zukas Primary Care Provider: Duncan Dull Provider/Extender: G Referring Provider: Denton Brick in Treatment: 3 Diagnosis Coding ICD-10 Codes Code Description E11.621 Type 2 diabetes mellitus with foot ulcer L89.623 Pressure ulcer of left heel, stage 3 L89.613 Pressure ulcer of right heel, stage 3 Facility Procedures CPT4 Code: [id number] Description: 11042 - DEB SUBQ TISSUE 20 SQ CM/< ICD-10 Description Diagnosis L89.623 Pressure ulcer of left heel, stage 3 L89.613 Pressure ulcer of right heel, stage 3 Modifier: Quantity: 1 Physician Procedures CPT4 Code: [id number] Description: 11042 - WC PHYS SUBQ TISS 20 SQ CM ICD-10 Description Diagnosis L89.623 Pressure ulcer of left heel, stage 3 L89.613 Pressure ulcer of right heel, stage 3 Modifier: Quantity:  1 Electronic Signature(s) Signed: 09/08/2016 4:43:34 PM By: Baltazar Najjar MD Entered By: Baltazar Najjar on 09/08/2016 15:16:36

## 2016-09-10 NOTE — Progress Notes (Signed)
[name], [name] Visit Report for 09/08/2016 Arrival Information Details Patient Name: [name] Date of Service: 09/08/2016 2:30 PM Medical Record Patient Account Number: [id number] Number: Treating RN: [name] [birth year] (81 y.o. Other Clinician: Date of Birth/Sex: Female) Treating [name] Primary Care [name]: [name]/Extender: [name] Referring Chapel Silverthorn: [name] in Treatment: 3 Visit Information History Since Last Visit All ordered tests and consults were completed: No Patient Arrived: [name] Added or deleted any medications: No Arrival Time: 14:28 Any new allergies or adverse reactions: No Accompanied By: self Had a fall or experienced change in No Transfer Assistance: EasyP[employer] activities of daily living that may affect Patient Lift risk of falls: Patient Identification Verified: Yes Signs or symptoms of abuse/neglect since last No Secondary Verification Process Yes visito Completed: Hospitalized since last visit: No Patient Requires Transmission- No Has Dressing in Place as Prescribed: Yes Based Precautions: Pain Present Now: No Patient Has Alerts: Yes Patient Alerts: DM II Electronic Signature(s) Signed: 09/08/2016 5:09:08 PM By: [name] Entered By: [name] on 09/08/2016 14:28:40 [name], [name] ([id number]) -------------------------------------------------------------------------------- Encounter Discharge Information Details Patient Name: [name] Date of Service: 09/08/2016 2:30 PM Medical Record Patient Account Number: [id number] Number: Treating RN: [name] [birth year] (81 y.o. Other Clinician: Date of Birth/Sex: Female) Treating [name] Primary Care [name]: [name]/Extender: [name] Referring [name]: [name] in Treatment: 3 Encounter Discharge Information Items Discharge Pain Level: 0 Discharge Condition:  Stable Ambulatory Status: Walker Discharge Destination: Nursing Home Transportation: Other Accompanied By: daughter Schedule Follow-up Appointment: Yes Medication Reconciliation completed No and provided to Patient/Care Anita [name]: Provided on Clinical Summary of Care: 09/08/2016 Form Type Recipient Paper Patient [facility] Electronic Signature(s) Signed: 09/08/2016 5:09:08 PM By: [name] Previous Signature: 09/08/2016 3:13:35 PM Version By: Gwenlyn Perking Entered By: [name] on 09/08/2016 16:20:12 [name], [name] ([id number]) -------------------------------------------------------------------------------- Lower Extremity Assessment Details Patient Name: [name] Date of Service: 09/08/2016 2:30 PM Medical Record Patient Account Number: [id number] Number: Treating RN: [name] 1936-02-11 (81 y.o. Other Clinician: Date of Birth/Sex: Female) Treating [name] Primary Care [name]: [name]/Extender: [name] Referring [name]: [name] in Treatment: 3 Vascular Assessment Pulses: Dorsalis Pedis Palpable: [Left:Yes] [Right:Yes] Posterior Tibial Extremity colors, hair growth, and conditions: Extremity Color: [Left:Hyperpigmented] [Right:Hyperpigmented] Temperature of Extremity: [Left:Warm] [Right:Warm] Capillary Refill: [Left:< 3 seconds] [Right:< 3 seconds] Electronic Signature(s) Signed: 09/08/2016 5:09:08 PM By: [name] Entered By: [name] on 09/08/2016 14:41:37 [name], [name] ([id number]) -------------------------------------------------------------------------------- Multi Wound Chart Details Patient Name: [name] Date of Service: 09/08/2016 2:30 PM Medical Record Patient Account Number: [id number] Number: Treating RN: [name] [birth year] (81 y.o. Other Clinician: Date of Birth/Sex: Female) Treating [name] Primary Care Luzelena Heeg: [name]/Extender:  [name] Referring [name]: [name] in Treatment: 3 Vital Signs Height(in): 66 Pulse(bpm): 67 Weight(lbs): 140.8 Blood Pressure 123/50 (mmHg): Body Mass Index(BMI): 23 Temperature(F): Respiratory Rate 16 (breaths/min): Photos: [1:No Photos] [2:No Photos] [N/A:N/A] Wound Location: [1:Right Calcaneus] [2:Left Calcaneus] [N/A:N/A] Wounding Event: [1:Pressure Injury] [2:Pressure Injury] [N/A:N/A] Primary Etiology: [1:Pressure Ulcer] [2:Pressure Ulcer] [N/A:N/A] Comorbid History: [1:Cataracts, Anemia, Congestive Heart Failure, Congestive Heart Failure, Hypertension, Type II Diabetes, Gout, Dementia, Diabetes, Gout, Dementia, Neuropathy] [2:Cataracts, Anemia, Hypertension, Type II Neuropathy] [N/A:N/A] Date Acquired: [1:07/05/2016] [2:07/05/2016] [N/A:N/A] Weeks of Treatment: [1:3] [2:3] [N/A:N/A] Wound Status: [1:Open] [2:Open] [N/A:N/A] Measurements L x W x D 1.4x3x0.3 [2:2.9x3.8x0.2] [N/A:N/A] (cm) Area (cm) : [1:3.299] [2:8.655] [N/A:N/A] Volume (cm) : [1:0.99] [2:1.731] [N/A:N/A] % Reduction in Area: [1:53.30%] [2:21.30%] [N/A:N/A] % Reduction  in Volume: 30.00% [2:21.30%] [N/A:N/A] Classification: [1:Category/Stage III] [2:Category/Stage III] [N/A:N/A] HBO Classification: [1:Grade 1] [2:Grade 1] [N/A:N/A] Exudate Amount: [1:Large] [2:Large] [N/A:N/A] Exudate Type: [1:Serous] [2:Serous] [N/A:N/A] Exudate Color: [1:amber] [2:amber] [N/A:N/A] Foul Odor After [1:Yes] [2:Yes] [N/A:N/A] Cleansing: Odor Anticipated Due to No [2:No] [N/A:N/A] Product Use: Wound Margin: [1:Flat and Intact] [2:Flat and Intact] [N/A:N/A] Granulation Amount: Medium (34-66%) Large (67-100%) N/A Granulation Quality: Pink Pink N/A Necrotic Amount: Medium (34-66%) Small (1-33%) N/A Necrotic Tissue: Eschar, Adherent Slough Eschar, Adherent Slough N/A Exposed Structures: Fascia: No Fascia: No N/A Fat Layer (Subcutaneous Fat Layer (Subcutaneous Tissue) Exposed: No Tissue) Exposed: No Tendon:  No Tendon: No Muscle: No Muscle: No Joint: No Joint: No Bone: No Bone: No Limited to Skin Limited to Skin Breakdown Breakdown Epithelialization: None None N/A Debridement: Debridement (16109- N/A N/A 11047) Pre-procedure 14:56 N/A N/A Verification/Time Out Taken: Pain Control: Lidocaine 4% Topical N/A N/A Solution Tissue Debrided: Fibrin/Slough, Exudates, N/A N/A Subcutaneous Level: Skin/Subcutaneous N/A N/A Tissue Debridement Area (sq 4.2 N/A N/A cm): Instrument: Curette N/A N/A Bleeding: Minimum N/A N/A Hemostasis Achieved: Pressure N/A N/A Procedural Pain: 0 N/A N/A Post Procedural Pain: 0 N/A N/A Debridement Treatment Procedure was tolerated N/A N/A Response: well Post Debridement 1.4x3x0.3 N/A N/A Measurements L x W x D (cm) Post Debridement 0.99 N/A N/A Volume: (cm) Post Debridement Category/Stage III N/A N/A Stage: Periwound Skin Texture: Excoriation: No Excoriation: No N/A Induration: No Induration: No Callus: No Callus: No Crepitus: No Crepitus: No Rash: No Rash: No Scarring: No Scarring: No Periwound Skin Maceration: No Maceration: No N/A Moisture: Dry/Scaly: No Dry/Scaly: No Periwound Skin Color: N/A Feldt, Dinora ([id number]) Atrophie Blanche: No Atrophie Blanche: No Cyanosis: No Cyanosis: No Ecchymosis: No Ecchymosis: No Erythema: No Erythema: No Hemosiderin Staining: No Hemosiderin Staining: No Mottled: No Mottled: No Pallor: No Pallor: No Rubor: No Rubor: No Temperature: No Abnormality No Abnormality N/A Tenderness on Yes Yes N/A Palpation: Wound Preparation: Ulcer Cleansing: Ulcer Cleansing: N/A Rinsed/Irrigated with Rinsed/Irrigated with Saline Saline Topical Anesthetic Topical Anesthetic Applied: Other: lidocaine Applied: Other: lidocaine 4% 4% Procedures Performed: Debridement N/A N/A Treatment Notes Electronic Signature(s) Signed: 09/08/2016 4:43:34 PM By: Baltazar Najjar MD Entered By: Baltazar Najjar on  09/08/2016 15:12:54 [name], [name] ([id number]) -------------------------------------------------------------------------------- Multi-Disciplinary Care Plan Details Patient Name: [name] Date of Service: 09/08/2016 2:30 PM Medical Record Patient Account Number: [id number] Number: Treating RN: [name] May 16, 1935 (80 y.o. Other Clinician: Date of Birth/Sex: Female) Treating [name] Primary Care Melanni Benway: [name] Lorrie Strauch/Extender: [name] Referring Valisha Heslin: [name] in Treatment: 3 Active Inactive ` Abuse / Safety / Falls / Self Care Management Nursing Diagnoses: Impaired physical mobility Potential for falls Goals: Patient will remain injury free Date Initiated: 08/18/2016 Target Resolution Date: 10/29/2016 Goal Status: Active Interventions: Assess fall risk on admission and as needed Notes: ` Nutrition Nursing Diagnoses: Potential for alteratiion in Nutrition/Potential for imbalanced nutrition Goals: Patient/caregiver agrees to and verbalizes understanding of need to use nutritional supplements and/or vitamins as prescribed Date Initiated: 08/18/2016 Target Resolution Date: 10/29/2016 Goal Status: Active Interventions: Assess patient nutrition upon admission and as needed per policy Notes: ` Orientation to the Wound Care Program [name], [name] ([id number]) Nursing Diagnoses: Knowledge deficit related to the wound healing center program Goals: Patient/caregiver will verbalize understanding of the Wound Healing Center Program Date Initiated: 08/18/2016 Target Resolution Date: 10/29/2016 Goal Status: Active Interventions: Provide education on orientation to the wound center Notes: ` Wound/Skin Impairment Nursing Diagnoses: Impaired tissue integrity Goals: Patient/caregiver will verbalize understanding of skin care  regimen Date Initiated: 08/18/2016 Target Resolution Date: 10/29/2016 Goal Status: Active Ulcer/skin breakdown  will have a volume reduction of 30% by week 4 Date Initiated: 08/18/2016 Target Resolution Date: 10/29/2016 Goal Status: Active Ulcer/skin breakdown will have a volume reduction of 50% by week 8 Date Initiated: 08/18/2016 Target Resolution Date: 10/29/2016 Goal Status: Active Ulcer/skin breakdown will have a volume reduction of 80% by week 12 Date Initiated: 08/18/2016 Target Resolution Date: 10/29/2016 Goal Status: Active Ulcer/skin breakdown will heal within 14 weeks Date Initiated: 08/18/2016 Target Resolution Date: 10/29/2016 Goal Status: Active Interventions: Assess patient/caregiver ability to obtain necessary supplies Assess patient/caregiver ability to perform ulcer/skin care regimen upon admission and as needed Assess ulceration(s) every visit Notes: Electronic Signature(s) Signed: 09/08/2016 5:09:08 PM By: Barth KirksPinkerton, Debra Hannibal, Fontella ([id number]) Entered By: [name] MullingPinkerton, Debra on 09/08/2016 14:42:10 Ruis, Charlcie ([id number]) -------------------------------------------------------------------------------- Pain Assessment Details Patient Name: [name] LoboHEELY, Teasia Date of Service: 09/08/2016 2:30 PM Medical Record Patient Account Number: [id number] Number: Treating RN: [name] Haggisinkerton, Debi 08/08/1935 (80 y.o. Other Clinician: Date of Birth/Sex: Female) Treating [name] Primary Care Roniqua Kintz: [name] Dullullo, Teresa Kahlie Deutscher/Extender: [name] Referring Dewel Lotter: [name] Brickullo, Teresa Weeks in Treatment: 3 Active Problems Location of Pain Severity and Description of Pain Patient Has Paino No Site Locations With Dressing Change: No Pain Management and Medication Current Pain Management: Electronic Signature(s) Signed: 09/08/2016 5:09:08 PM By: [name] MullingPinkerton, Debra Entered By: [name] MullingPinkerton, Debra on 09/08/2016 14:28:45 [name], [name] ([id number]) -------------------------------------------------------------------------------- Patient/Caregiver Education Details Patient Name: [name] LoboHEELY, Savonna Date  of Service: 09/08/2016 2:30 PM Medical Record Patient Account Number: [id number] Number: Treating RN: [name] Haggisinkerton, Debi 08/08/1935 (80 y.o. Other Clinician: Date of Birth/Gender: Female) Treating [name] Primary Care Physician/Extender: Virgil BenedictG Tullo, Teresa Physician: Tania AdeWeeks in Treatment: 3 Referring Physician: Duncan Dullullo, Teresa Education Assessment Education Provided To: Patient Education Topics Provided Wound/Skin Impairment: Handouts: Other: change dressing as ordered Methods: Demonstration, Explain/Verbal Responses: State content correctly Electronic Signature(s) Signed: 09/08/2016 5:09:08 PM By: [name] MullingPinkerton, Debra Entered By: [name] MullingPinkerton, Debra on 09/08/2016 16:20:25 [name], [name] ([id number]) -------------------------------------------------------------------------------- Wound Assessment Details Patient Name: [name] LoboHEELY, Yazaira Date of Service: 09/08/2016 2:30 PM Medical Record Patient Account Number: [id number] Number: Treating RN: [name] Haggisinkerton, Debi 08/08/1935 (80 y.o. Other Clinician: Date of Birth/Sex: Female) Treating [name] Primary Care [name] Goodenow: [name] Dullullo, Teresa Jazalyn Mondor/Extender: [name] Referring Cowan Pilar: [name] Brickullo, Teresa Weeks in Treatment: 3 Wound Status Wound Number: 1 Primary Pressure Ulcer Etiology: Wound Location: Right Calcaneus Wound Open Wounding Event: Pressure Injury Status: Date Acquired: 07/05/2016 Comorbid Cataracts, Anemia, Congestive Heart Weeks Of Treatment: 3 History: Failure, Hypertension, Type II Diabetes, Clustered Wound: No Gout, Dementia, Neuropathy Photos Photo Uploaded By: [name] MullingPinkerton, Debra on 09/08/2016 16:08:17 Wound Measurements Length: (cm) 1.4 Width: (cm) 3 Depth: (cm) 0.3 Area: (cm) 3.299 Volume: (cm) 0.99 % Reduction in Area: 53.3% % Reduction in Volume: 30% Epithelialization: None Tunneling: No Undermining: No Wound Description Classification: Category/Stage III Foul Odor Afte Diabetic Severity  (Wagner): Grade 1 Due to Product Wound Margin: Flat and Intact Slough/Fibrino Exudate Amount: Large Exudate Type: Serous Exudate Color: amber r Cleansing: Yes Use: No No Wound Bed Granulation Amount: Medium (34-66%) Exposed Structure [name], [name] ([id number]) Granulation Quality: Pink Fascia Exposed: No Necrotic Amount: Medium (34-66%) Fat Layer (Subcutaneous Tissue) Exposed: No Necrotic Quality: Eschar, Adherent Slough Tendon Exposed: No Muscle Exposed: No Joint Exposed: No Bone Exposed: No Limited to Skin Breakdown Periwound Skin Texture Texture Color No Abnormalities Noted: No No Abnormalities Noted: No Callus: No Atrophie Blanche: No Crepitus: No Cyanosis: No Excoriation: No Ecchymosis: No Induration: No Erythema: No Rash: No Hemosiderin Staining: No Scarring:  No Mottled: No Pallor: No Moisture Rubor: No No Abnormalities Noted: No Dry / Scaly: No Temperature / Pain Maceration: No Temperature: No Abnormality Tenderness on Palpation: Yes Wound Preparation Ulcer Cleansing: Rinsed/Irrigated with Saline Topical Anesthetic Applied: Other: lidocaine 4%, Treatment Notes Wound #1 (Right Calcaneus) 1. Cleansed with: Clean wound with Normal Saline 2. Anesthetic Topical Lidocaine 4% cream to wound bed prior to debridement 4. Dressing Applied: Santyl Ointment 5. Secondary Dressing Applied Dry Gauze Kerlix/Conform 7. Secured with Tape Notes heel cup Electronic Signature(s) Signed: 09/08/2016 5:09:08 PM By: [name] Entered By: [name] on 09/08/2016 14:40:45 [name], [name] [name], [name] ([id number]) -------------------------------------------------------------------------------- Wound Assessment Details Patient Name: [name] Date of Service: 09/08/2016 2:30 PM Medical Record Patient Account Number: [id number] Number: Treating RN: [name] 1935/10/11 (80 y.o. Other Clinician: Date of  Birth/Sex: Female) Treating [name] Primary Care Jaiyon Wander: [name] Cynda Soule/Extender: [name] Referring Kenta Laster: [name] in Treatment: 3 Wound Status Wound Number: 2 Primary Pressure Ulcer Etiology: Wound Location: Left Calcaneus Wound Open Wounding Event: Pressure Injury Status: Date Acquired: 07/05/2016 Comorbid Cataracts, Anemia, Congestive Heart Weeks Of Treatment: 3 History: Failure, Hypertension, Type II Diabetes, Clustered Wound: No Gout, Dementia, Neuropathy Photos Photo Uploaded By: [name] on 09/08/2016 16:08:17 Wound Measurements Length: (cm) 2.9 Width: (cm) 3.8 Depth: (cm) 0.2 Area: (cm) 8.655 Volume: (cm) 1.731 % Reduction in Area: 21.3% % Reduction in Volume: 21.3% Epithelialization: None Tunneling: No Undermining: No Wound Description Classification: Category/Stage III Foul Odor Afte Diabetic Severity (Wagner): Grade 1 Due to Product Wound Margin: Flat and Intact Slough/Fibrino Exudate Amount: Large Exudate Type: Serous Exudate Color: amber r Cleansing: Yes Use: No No Wound Bed Granulation Amount: Large (67-100%) Exposed Structure [name], [name] ([id number]) Granulation Quality: Pink Fascia Exposed: No Necrotic Amount: Small (1-33%) Fat Layer (Subcutaneous Tissue) Exposed: No Necrotic Quality: Eschar, Adherent Slough Tendon Exposed: No Muscle Exposed: No Joint Exposed: No Bone Exposed: No Limited to Skin Breakdown Periwound Skin Texture Texture Color No Abnormalities Noted: No No Abnormalities Noted: No Callus: No Atrophie Blanche: No Crepitus: No Cyanosis: No Excoriation: No Ecchymosis: No Induration: No Erythema: No Rash: No Hemosiderin Staining: No Scarring: No Mottled: No Pallor: No Moisture Rubor: No No Abnormalities Noted: No Dry / Scaly: No Temperature / Pain Maceration: No Temperature: No Abnormality Tenderness on Palpation: Yes Wound Preparation Ulcer Cleansing: Rinsed/Irrigated  with Saline Topical Anesthetic Applied: Other: lidocaine 4%, Treatment Notes Wound #2 (Left Calcaneus) 1. Cleansed with: Clean wound with Normal Saline 2. Anesthetic Topical Lidocaine 4% cream to wound bed prior to debridement 4. Dressing Applied: Santyl Ointment 5. Secondary Dressing Applied Dry Gauze Kerlix/Conform 7. Secured with Tape Notes heel cup Electronic Signature(s) Signed: 09/08/2016 5:09:08 PM By: [name] Entered By: [name] on 09/08/2016 14:41:11 [place], [place] ([id number]) [place], [place] ([id number]) -------------------------------------------------------------------------------- Vitals Details Patient Name: [name] Date of Service: 09/08/2016 2:30 PM Medical Record Patient Account Number: [id number] Number: Treating RN: [name] [birth year] (80 y.o. Other Clinician: Date of Birth/Sex: Female) Treating [name] Primary Care Joud Ingwersen: [name] Lonnetta Kniskern/Extender: [name] Referring Leily Capek: [name] in Treatment: 3 Vital Signs Time Taken: 14:29 Pulse (bpm): 67 Height (in): 66 Respiratory Rate (breaths/min): 16 Weight (lbs): 140.8 Blood Pressure (mmHg): 123/50 Body Mass Index (BMI): 22.7 Reference Range: 80 - 120 mg / dl Electronic Signature(s) Signed: 09/08/2016 5:09:08 PM By: [name] Entered By: [name] on 09/08/2016 14:33:57

## 2016-09-15 ENCOUNTER — Encounter: Payer: Medicare (Managed Care) | Admitting: Internal Medicine

## 2016-09-15 DIAGNOSIS — E11621 Type 2 diabetes mellitus with foot ulcer: Secondary | ICD-10-CM | POA: Diagnosis not present

## 2016-09-17 NOTE — Progress Notes (Signed)
[name], [name] Visit Report for 09/15/2016 HPI Details Patient Name: [name], [name] Date of Service: 09/15/2016 1:30 PM Medical Record Patient Account Number: [id number] Number: Treating RN: Curtis Sites 09/23/1935 (80 y.o. Other Clinician: Date of Birth/Sex: Female) Treating Ryley Bachtel Primary Care Provider: Duncan Dull Provider/Extender: G Referring Provider: Denton Brick in Treatment: 4 History of Present Illness HPI Description: 08/18/16; this is a 81 year old woman who is a type II diabetic not currently on any treatment. She is also listed as having Alzheimer's disease hypertension. She has a history of chronic venous insufficiency with leg wounds in the past but no history of wounds in her feet. In terms of her diabetes at one point she was on insulin however she is no longer on any current treatment, her daughter who is providing most of the history is unaware of her hemoglobin A1c. She has stage IV chronic renal failure and follows with nephrology. The history provided by her daughter is that she has had a wound on the left heel perhaps dating back to last May/17. At that point she was in the hospital predominantly with psychiatric issues and was discharged [facility]. This was treated with some form of foam dressing and may have actually healed however she was readmitted to hospital in January with Escherichia coli sepsis felt to be secondary to a UTI. Since then she is in [facility] skilled facility. Apparently this timeframe both the left heel and right heel reopened or at least the daughter became aware that they were both open. The exact timeframe isn't really certain Her ABIs in this clinic were 1.08 on the right 1.25 on the left. Looking through [employer] doesn't really provide any useful information. She did have venous ultrasounds in 2016 that did not show a DVT. I don't see a recent hemoglobin A1c 08/25/16; from last  week we have been using Santyl to both heels. Apparently the nursing home didn't get an x-ray of both heels [[facility]] and the daughter states that there was "no injury to bone" but for some reason we haven't been able to get the official report from them. 09/01/16; continued improvement in both wounds on her bilateral heels using Santyl. X-ray report was negative for osteomyelitis 09/08/16; patient from [facility] skilled facility. Using Santyl on both her heel wounds which are fortunately not on the plantar surface. 09/15/16; patient is from [facility] skilled facility. She has been using Santyl on both her pressure areas which were stage III. Fortunately these or not on the plantar surface. Electronic Signature(s) Signed: 09/15/2016 5:34:29 PM By: Baltazar Najjar MD Entered By: Baltazar Najjar on 09/15/2016 14:05:39 [place], [place] ([id number]) [place], [place] ([id number]) -------------------------------------------------------------------------------- Physical Exam Details Patient Name: [name] Date of Service: 09/15/2016 1:30 PM Medical Record Patient Account Number: [id number] Number: Treating RN: Curtis Sites 1935/10/27 (80 y.o. Other Clinician: Date of Birth/Sex: Female) Treating Anvita Hirata Primary Care Provider: Duncan Dull Provider/Extender: G Referring Provider: Denton Brick in Treatment: 4 Constitutional Sitting or standing Blood Pressure is within target range for patient.. Pulse regular and within target range for patient.Marland Kitchen Respirations regular, non-labored and within target range.. Temperature is normal and within the target range for the patient.Marland Kitchen appears in no distress. Eyes Conjunctivae clear. No discharge. Respiratory Respiratory effort is easy and symmetric bilaterally. Rate is normal at rest and on room air.. Bilateral breath sounds are clear and equal in all lobes with no wheezes, rales or  rhonchi.. Cardiovascular Heart rhythm and rate regular,  without murmur or gallop.. Pedal pulses palpable and strong bilaterally.. Lymphatic None palpable in the popliteal or inguinal area bilaterally. Psychiatric No evidence of depression, anxiety, or agitation. Calm, cooperative, and communicative. Appropriate interactions and affect.. Notes Wound exam; the patient has bilateral heel wounds which were both stage III pressure ulcers. The area on the left has a healthy surface nice-looking granulation. For the first time the area on the right also has a healthy surface no debridement was required in either area. There is no evidence of circumferential infection Electronic Signature(s) Signed: 09/15/2016 5:34:29 PM By: Baltazar Najjarobson, Braya Habermehl MD Entered By: Baltazar Najjarobson, Avenell Sellers on 09/15/2016 14:07:46 [place] ManorHE[place], [name] ([id number]) -------------------------------------------------------------------------------- Physician Orders Details Patient Name: [name], [name] Date of Service: 09/15/2016 1:30 PM Medical Record Patient Account Number: [id number] Number: Treating RN: Curtis SitesDorthy, Joanna 15-Sep-1935 (80 y.o. Other Clinician: Date of Birth/Sex: Female) Treating Shareeka Yim Primary Care Provider: Duncan Dullullo, Teresa Provider/Extender: G Referring Provider: Denton Brickullo, Teresa Weeks in Treatment: 4 Verbal / Phone Orders: No Diagnosis Coding Wound Cleansing Wound #1 Right Calcaneus o Clean wound with Normal Saline. o May Shower, gently pat wound dry prior to applying new dressing. Wound #2 Left Calcaneus o Clean wound with Normal Saline. o May Shower, gently pat wound dry prior to applying new dressing. Anesthetic Wound #1 Right Calcaneus o Topical Lidocaine 4% cream applied to wound bed prior to debridement Wound #2 Left Calcaneus o Topical Lidocaine 4% cream applied to wound bed prior to debridement Primary Wound Dressing Wound #1 Right Calcaneus o Hydrafera Blue Wound #2 Left  Calcaneus o Hydrafera Blue Secondary Dressing Wound #1 Right Calcaneus o Gauze and Kerlix/Conform o Foam - heel cups Wound #2 Left Calcaneus o Gauze and Kerlix/Conform o Foam - heel cups Dressing Change Frequency Wound #1 Right Calcaneus Slusher, Richardine ([id number]) o Change Dressing Monday, Wednesday, Friday Wound #2 Left Calcaneus o Change Dressing Monday, Wednesday, Friday Follow-up Appointments Wound #1 Right Calcaneus o Return Appointment in 1 week. Wound #2 Left Calcaneus o Return Appointment in 1 week. Edema Control Wound #1 Right Calcaneus o Elevate legs to the level of the heart and pump ankles as often as possible Wound #2 Left Calcaneus o Elevate legs to the level of the heart and pump ankles as often as possible Off-Loading Wound #1 Right Calcaneus o Turn and reposition every 2 hours o Other: - wear heel protector boots at al times in bed, float heels while in bed, Wound #2 Left Calcaneus o Turn and reposition every 2 hours o Other: - wear heel protector boots at al times in bed, float heels while in bed, Additional Orders / Instructions Wound #1 Right Calcaneus o Increase protein intake. o Other: - please add vitamin A, vitamin C and zinc supplements to your diet Wound #2 Left Calcaneus o Increase protein intake. o Other: - please add vitamin A, vitamin C and zinc supplements to your diet Electronic Signature(s) Signed: 09/15/2016 5:24:32 PM By: Curtis Sitesorthy, Joanna Signed: 09/15/2016 5:34:29 PM By: Baltazar Najjarobson, Whitt Auletta MD Entered By: Curtis Sitesorthy, Joanna on 09/15/2016 13:58:38 [name], [name] ([id number]) -------------------------------------------------------------------------------- Problem List Details Patient Name: [name], Shaunae Date of Service: 09/15/2016 1:30 PM Medical Record Patient Account Number: [id number] Number: Treating RN: Curtis Sitesorthy, Joanna 15-Sep-1935 (80 y.o. Other Clinician: Date of Birth/Sex: Female) Treating  Garnell Begeman Primary Care Provider: Duncan Dullullo, Teresa Provider/Extender: G Referring Provider: Denton Brickullo, Teresa Weeks in Treatment: 4 Active Problems ICD-10 Encounter Code Description Active Date Diagnosis E11.621 Type 2 diabetes mellitus with foot ulcer 08/18/2016 Yes L89.623 Pressure ulcer of left heel, stage  3 08/18/2016 Yes L89.613 Pressure ulcer of right heel, stage 3 08/18/2016 Yes Inactive Problems Resolved Problems Electronic Signature(s) Signed: 09/15/2016 5:34:29 PM By: Baltazar Najjar MD Entered By: Baltazar Najjar on 09/15/2016 14:03:49 [name], [name] -------------------------------------------------------------------------------- Progress Note Details Patient Name: [name] Date of Service: 09/15/2016 1:30 PM Medical Record Patient Account Number: [id number] Number: Treating RN: Curtis Sites [birth year] (80 y.o. Other Clinician: Date of Birth/Sex: Female) Treating Skylie Hiott Primary Care Provider: Duncan Dull Provider/Extender: G Referring Provider: Denton Brick in Treatment: 4 Subjective History of Present Illness (HPI) 08/18/16; this is a 81 year old woman who is a type II diabetic not currently on any treatment. She is also listed as having Alzheimer's disease hypertension. She has a history of chronic venous insufficiency with leg wounds in the past but no history of wounds in her feet. In terms of her diabetes at one point she was on insulin however she is no longer on any current treatment, her daughter who is providing most of the history is unaware of her hemoglobin A1c. She has stage IV chronic renal failure and follows with nephrology. The history provided by her daughter is that she has had a wound on the left heel perhaps dating back to last May/17. At that point she was in the hospital predominantly with psychiatric issues and was discharged to [facility]. This was treated with some form of foam dressing and may have  actually healed however she was readmitted to hospital in January with Escherichia coli sepsis felt to be secondary to a UTI. Since then she is in [facility] skilled facility. Apparently this timeframe both the left heel and right heel reopened or at least the daughter became aware that they were both open. The exact timeframe isn't really certain Her ABIs in this clinic were 1.08 on the right 1.25 on the left. Looking through [employer] doesn't really provide any useful information. She did have venous ultrasounds in 2016 that did not show a DVT. I don't see a recent hemoglobin A1c 08/25/16; from last week we have been using Santyl to both heels. Apparently the nursing home didn't get an x-ray of both heels [[facility]] and the daughter states that there was "no injury to bone" but for some reason we haven't been able to get the official report from them. 09/01/16; continued improvement in both wounds on her bilateral heels using Santyl. X-ray report was negative for osteomyelitis 09/08/16; patient from [facility] skilled facility. Using Santyl on both her heel wounds which are fortunately not on the plantar surface. 09/15/16; patient is from [facility] skilled facility. She has been using Santyl on both her pressure areas which were stage III. Fortunately these or not on the plantar surface. Objective Constitutional Sitting or standing Blood Pressure is within target range for patient.. Pulse regular and within target range [name], [name] ([id number]) for patient.Marland Kitchen Respirations regular, non-labored and within target range.. Temperature is normal and within the target range for the patient.Marland Kitchen appears in no distress. Vitals Time Taken: 1:37 PM, Height: 66 in, Weight: 140.8 lbs, BMI: 22.7, Temperature: 97.8 F, Pulse: 70 bpm, Respiratory Rate: 16 breaths/min, Blood Pressure: 124/48 mmHg. Eyes Conjunctivae clear. No discharge. Respiratory Respiratory effort is easy and  symmetric bilaterally. Rate is normal at rest and on room air.. Bilateral breath sounds are clear and equal in all lobes with no wheezes, rales or rhonchi.. Cardiovascular Heart rhythm and rate regular, without murmur or gallop.. Pedal pulses palpable and strong bilaterally.. Lymphatic None palpable  in the popliteal or inguinal area bilaterally. Psychiatric No evidence of depression, anxiety, or agitation. Calm, cooperative, and communicative. Appropriate interactions and affect.. General Notes: Wound exam; the patient has bilateral heel wounds which were both stage III pressure ulcers. The area on the left has a healthy surface nice-looking granulation. For the first time the area on the right also has a healthy surface no debridement was required in either area. There is no evidence of circumferential infection Integumentary (Hair, Skin) Wound #1 status is Open. Original cause of wound was Pressure Injury. The wound is located on the Right Calcaneus. The wound measures 1.4cm length x 2.6cm width x 0.2cm depth; 2.859cm^2 area and 0.572cm^3 volume. The wound is limited to skin breakdown. There is no tunneling or undermining noted. There is a large amount of serous drainage noted. The wound margin is flat and intact. There is large (67- 100%) pink granulation within the wound bed. There is a small (1-33%) amount of necrotic tissue within the wound bed including Eschar and Adherent Slough. The periwound skin appearance did not exhibit: Callus, Crepitus, Excoriation, Induration, Rash, Scarring, Dry/Scaly, Maceration, Atrophie Blanche, Cyanosis, Ecchymosis, Hemosiderin Staining, Mottled, Pallor, Rubor, Erythema. Periwound temperature was noted as No Abnormality. The periwound has tenderness on palpation. Wound #2 status is Open. Original cause of wound was Pressure Injury. The wound is located on the Left Calcaneus. The wound measures 2.3cm length x 3.3cm width x 0.2cm depth; 5.961cm^2 area  and 1.192cm^3 volume. The wound is limited to skin breakdown. There is no tunneling or undermining noted. There is a large amount of serous drainage noted. The wound margin is flat and intact. There is large (67- 100%) pink granulation within the wound bed. There is a small (1-33%) amount of necrotic tissue within the wound bed including Eschar and Adherent Slough. The periwound skin appearance did not exhibit: Callus, Crepitus, Excoriation, Induration, Rash, Scarring, Dry/Scaly, Maceration, Atrophie Blanche, Cyanosis, Ecchymosis, Hemosiderin Staining, Mottled, Pallor, Rubor, Erythema. Periwound temperature was noted as No Abnormality. The periwound has tenderness on palpation. [name], [name] ([id number]) Assessment Active Problems ICD-10 E11.621 - Type 2 diabetes mellitus with foot ulcer L89.623 - Pressure ulcer of left heel, stage 3 L89.613 - Pressure ulcer of right heel, stage 3 Plan Wound Cleansing: Wound #1 Right Calcaneus: Clean wound with Normal Saline. May Shower, gently pat wound dry prior to applying new dressing. Wound #2 Left Calcaneus: Clean wound with Normal Saline. May Shower, gently pat wound dry prior to applying new dressing. Anesthetic: Wound #1 Right Calcaneus: Topical Lidocaine 4% cream applied to wound bed prior to debridement Wound #2 Left Calcaneus: Topical Lidocaine 4% cream applied to wound bed prior to debridement Primary Wound Dressing: Wound #1 Right Calcaneus: Hydrafera Blue Wound #2 Left Calcaneus: Hydrafera Blue Secondary Dressing: Wound #1 Right Calcaneus: Gauze and Kerlix/Conform Foam - heel cups Wound #2 Left Calcaneus: Gauze and Kerlix/Conform Foam - heel cups Dressing Change Frequency: Wound #1 Right Calcaneus: Change Dressing Monday, Wednesday, Friday Wound #2 Left Calcaneus: Change Dressing Monday, Wednesday, Friday Follow-up Appointments: Wound #1 Right Calcaneus: Return Appointment in 1 week. [name], [name] ([id number]) Wound #2  Left Calcaneus: Return Appointment in 1 week. Edema Control: Wound #1 Right Calcaneus: Elevate legs to the level of the heart and pump ankles as often as possible Wound #2 Left Calcaneus: Elevate legs to the level of the heart and pump ankles as often as possible Off-Loading: Wound #1 Right Calcaneus: Turn and reposition every 2 hours Other: - wear heel protector boots at al  times in bed, float heels while in bed, Wound #2 Left Calcaneus: Turn and reposition every 2 hours Other: - wear heel protector boots at al times in bed, float heels while in bed, Additional Orders / Instructions: Wound #1 Right Calcaneus: Increase protein intake. Other: - please add vitamin A, vitamin C and zinc supplements to your diet Wound #2 Left Calcaneus: Increase protein intake. Other: - please add vitamin A, vitamin C and zinc supplements to your diet #1 change the primary dressing from Santyl to [facility] #2 heel cups #3 Kerlix changed by the facility Monday Wednesday Friday #4 follow-up next week Electronic Signature(s) Signed: 09/15/2016 5:34:29 PM By: Baltazar Najjar MD Entered By: Baltazar Najjar on 09/15/2016 14:08:49 [name], [name] ([id number]) -------------------------------------------------------------------------------- SuperBill Details Patient Name: [name] Date of Service: 09/15/2016 Medical Record Patient Account Number: [id number] Number: Treating RN: Curtis Sites 10-23-1935 (80 y.o. Other Clinician: Date of Birth/Sex: Female) Treating Diaz Crago Primary Care Provider: Duncan Dull Provider/Extender: G Referring Provider: Denton Brick in Treatment: 4 Diagnosis Coding ICD-10 Codes Code Description E11.621 Type 2 diabetes mellitus with foot ulcer L89.623 Pressure ulcer of left heel, stage 3 L89.613 Pressure ulcer of right heel, stage 3 Facility Procedures CPT4 Code: [id number] Description: 99213 - WOUND CARE VISIT-LEV 3 EST  PT Modifier: Quantity: 1 Physician Procedures CPT4 Code: [id number] Description: 99213 - WC PHYS LEVEL 3 - EST PT ICD-10 Description Diagnosis L89.623 Pressure ulcer of left heel, stage 3 L89.613 Pressure ulcer of right heel, stage 3 Modifier: Quantity: 1 Electronic Signature(s) Signed: 09/15/2016 2:20:40 PM By: Curtis Sites Signed: 09/15/2016 5:34:29 PM By: Baltazar Najjar MD Entered By: Curtis Sites on 09/15/2016 14:20:40

## 2016-09-17 NOTE — Progress Notes (Signed)
[name], [name] ([id number]) Visit Report for 09/15/2016 Arrival Information Details Patient Name: [name], Sian Date of Service: 09/15/2016 1:30 PM Medical Record Patient Account Number: [id number] Number: Treating RN: Curtis SitesDorthy, Joanna [name] 07, 1937 (80 y.o. Other Clinician: Date of Birth/Sex: Female) Treating [name] Primary Care [name]: Duncan [name], [name]: G Referring [name]: [name], [name] in Treatment: 4 Visit Information History Since Last Visit Added or deleted any medications: No Patient Arrived: Walker Any new allergies or adverse reactions: No Arrival Time: 13:33 Had a fall or experienced change in No Accompanied By: dtr activities of daily living that [name] affect Transfer Assistance: None risk of falls: Patient Identification Verified: Yes Signs or symptoms of abuse/neglect since last No Secondary Verification Process Completed: Yes visito Patient Requires Transmission-Based No Hospitalized since last visit: No Precautions: Has Dressing in Place as Prescribed: Yes Patient Has Alerts: Yes Pain Present Now: No Patient Alerts: DM II Electronic Signature(s) Signed: 09/15/2016 5:24:32 PM By: Curtis Sitesorthy, Joanna Entered By: Curtis Sitesorthy, Joanna on 09/15/2016 13:37:20 [name] ([id number]) -------------------------------------------------------------------------------- Clinic Level of Care Assessment Details Patient Name: [name] Date of Service: 09/15/2016 1:30 PM Medical Record Patient Account Number: [id number] Number: Treating RN: Curtis SitesDorthy, Joanna [name] 07, 1937 (80 y.o. Other Clinician: Date of Birth/Sex: Female) Treating [name] Primary Care Irma Delancey: Duncan [name], [name] Mate Alegria/Extender: G Referring Elbridge Magowan: [name], [name] in Treatment: 4 Clinic Level of Care Assessment Items TOOL 4 Quantity Score []  - Use when only an EandM is performed on FOLLOW-UP visit 0 ASSESSMENTS - Nursing Assessment /  Reassessment X - Reassessment of Co-morbidities (includes updates in patient status) 1 10 X - Reassessment of Adherence to Treatment Plan 1 5 ASSESSMENTS - Wound and Skin Assessment / Reassessment []  - Simple Wound Assessment / Reassessment - one wound 0 X - Complex Wound Assessment / Reassessment - multiple wounds 2 5 []  - Dermatologic / Skin Assessment (not related to wound area) 0 ASSESSMENTS - Focused Assessment []  - Circumferential Edema Measurements - multi extremities 0 []  - Nutritional Assessment / Counseling / Intervention 0 X - Lower Extremity Assessment (monofilament, tuning fork, pulses) 1 5 []  - Peripheral Arterial Disease Assessment (using hand held doppler) 0 ASSESSMENTS - Ostomy and/or Continence Assessment and Care []  - Incontinence Assessment and Management 0 []  - Ostomy Care Assessment and Management (repouching, etc.) 0 PROCESS - Coordination of Care X - Simple Patient / Family Education for ongoing care 1 15 []  - Complex (extensive) Patient / Family Education for ongoing care 0 []  - Staff obtains ChiropractorConsents, Records, Test Results / Process Orders 0 []  - Staff telephones HHA, Nursing Homes / Clarify orders / etc 0 [name] ([id number]) []  - Routine Transfer to another Facility (non-emergent condition) 0 []  - Routine Hospital Admission (non-emergent condition) 0 []  - New Admissions / Manufacturing engineernsurance Authorizations / Ordering NPWT, Apligraf, etc. 0 []  - Emergency Hospital Admission (emergent condition) 0 X - Simple Discharge Coordination 1 10 []  - Complex (extensive) Discharge Coordination 0 PROCESS - Special Needs []  - Pediatric / Minor Patient Management 0 []  - Isolation Patient Management 0 []  - Hearing / Language / Visual special needs 0 []  - Assessment of Community assistance (transportation, D/C planning, etc.) 0 []  - Additional assistance / Altered mentation 0 []  - Support Surface(s) Assessment (bed, cushion, seat, etc.) 0 INTERVENTIONS - Wound Cleansing /  Measurement []  - Simple Wound Cleansing - one wound 0 X - Complex Wound Cleansing - multiple wounds 2 5 X - Wound Imaging (photographs - any number of wounds) 1 5 []  -  Wound Tracing (instead of photographs) 0 []  - Simple Wound Measurement - one wound 0 X - Complex Wound Measurement - multiple wounds 2 5 INTERVENTIONS - Wound Dressings []  - Small Wound Dressing one or multiple wounds 0 X - Medium Wound Dressing one or multiple wounds 2 15 []  - Large Wound Dressing one or multiple wounds 0 []  - Application of Medications - topical 0 []  - Application of Medications - injection 0 [name] ([id number]) INTERVENTIONS - Miscellaneous []  - External ear exam 0 []  - Specimen Collection (cultures, biopsies, blood, body fluids, etc.) 0 []  - Specimen(s) / Culture(s) sent or taken to Lab for analysis 0 []  - Patient Transfer (multiple staff / Michiel Sites Lift / Similar devices) 0 []  - Simple Staple / Suture removal (25 or less) 0 []  - Complex Staple / Suture removal (26 or more) 0 []  - Hypo / Hyperglycemic Management (close monitor of Blood Glucose) 0 []  - Ankle / Brachial Index (ABI) - do not check if billed separately 0 X - Vital Signs 1 5 Has the patient been seen at the hospital within the last three years: Yes Total Score: 115 Level Of Care: New/Established - Level 3 Electronic Signature(s) Signed: 09/15/2016 5:24:32 PM By: Curtis Sites Entered By: Curtis Sites on 09/15/2016 14:20:30 [name] ([id number]) -------------------------------------------------------------------------------- Encounter Discharge Information Details Patient Name: [name] Date of Service: 09/15/2016 1:30 PM Medical Record Patient Account Number: [id number] Number: Treating RN: Curtis Sites [birth year] (80 y.o. Other Clinician: Date of Birth/Sex: Female) Treating [name] Primary Care [name]: Duncan Dull [name]: G Referring Oniel Meleski: [name] Brick in  Treatment: 4 Encounter Discharge Information Items Discharge Pain Level: 0 Discharge Condition: Stable Ambulatory Status: Walker Discharge Destination: Nursing Home Transportation: Private Auto Accompanied By: dtr Schedule Follow-up Appointment: Yes Medication Reconciliation completed No and provided to Patient/Care Kurt Azimi: Provided on Clinical Summary of Care: 09/15/2016 Form Type Recipient Paper Patient [facility] Electronic Signature(s) Signed: 09/15/2016 2:21:29 PM By: Curtis Sites Previous Signature: 09/15/2016 2:11:12 PM Version By: Gwenlyn Perking Entered By: Curtis Sites on 09/15/2016 14:21:29 Genao, Carmesha ([id number]) -------------------------------------------------------------------------------- Lower Extremity Assessment Details Patient Name: [name] Date of Service: 09/15/2016 1:30 PM Medical Record Patient Account Number: [id number] Number: Treating RN: Curtis Sites 1935/05/19 (80 y.o. Other Clinician: Date of Birth/Sex: Female) Treating [name] Primary Care Natoya Viscomi: Duncan Dull Dai Apel/Extender: G Referring Lurleen Soltero: [name] Brick in Treatment: 4 Vascular Assessment Pulses: Dorsalis Pedis Palpable: [Left:Yes] [Right:Yes] Posterior Tibial Extremity colors, hair growth, and conditions: Extremity Color: [Left:Hyperpigmented] [Right:Hyperpigmented] Hair Growth on Extremity: [Left:No] [Right:No] Temperature of Extremity: [Left:Warm] [Right:Warm] Capillary Refill: [Left:< 3 seconds] [Right:< 3 seconds] Electronic Signature(s) Signed: 09/15/2016 5:24:32 PM By: Curtis Sites Entered By: Curtis Sites on 09/15/2016 13:45:29 Keng, Atley ([id number]) -------------------------------------------------------------------------------- Multi Wound Chart Details Patient Name: [name] Date of Service: 09/15/2016 1:30 PM Medical Record Patient Account Number: [id number] Number: Treating RN: Curtis Sites 03-Oct-1935  (80 y.o. Other Clinician: Date of Birth/Sex: Female) Treating [name] Primary Care Vergia Chea: Duncan Dull Jodine Muchmore/Extender: G Referring Iraida Cragin: [name] Brick in Treatment: 4 Vital Signs Height(in): 66 Pulse(bpm): 70 Weight(lbs): 140.8 Blood Pressure 124/48 (mmHg): Body Mass Index(BMI): 23 Temperature(F): 97.8 Respiratory Rate 16 (breaths/min): Photos: [1:No Photos] [2:No Photos] [N/A:N/A] Wound Location: [1:Right Calcaneus] [2:Left Calcaneus] [N/A:N/A] Wounding Event: [1:Pressure Injury] [2:Pressure Injury] [N/A:N/A] Primary Etiology: [1:Pressure Ulcer] [2:Pressure Ulcer] [N/A:N/A] Comorbid History: [1:Cataracts, Anemia, Congestive Heart Failure, Congestive Heart Failure, Hypertension, Type II Diabetes, Gout, Dementia, Diabetes, Gout, Dementia, Neuropathy] [2:Cataracts, Anemia, Hypertension, Type II Neuropathy] [N/A:N/A] Date  Acquired: [1:07/05/2016] [2:07/05/2016] [N/A:N/A] [name] of Treatment: [1:4] [2:4] [N/A:N/A] Wound Status: [1:Open] [2:Open] [N/A:N/A] Measurements L x W x D 1.4x2.6x0.2 [2:2.3x3.3x0.2] [N/A:N/A] (cm) Area (cm) : [1:2.859] [2:5.961] [N/A:N/A] Volume (cm) : [1:0.572] [2:1.192] [N/A:N/A] % Reduction in Area: [1:59.60%] [2:45.80%] [N/A:N/A] % Reduction in Volume: 59.50% [2:45.80%] [N/A:N/A] Classification: [1:Category/Stage III] [2:Category/Stage III] [N/A:N/A] HBO Classification: [1:Grade 1] [2:Grade 1] [N/A:N/A] Exudate Amount: [1:Large] [2:Large] [N/A:N/A] Exudate Type: [1:Serous] [2:Serous] [N/A:N/A] Exudate Color: [1:amber] [2:amber] [N/A:N/A] Foul Odor After [1:Yes] [2:Yes] [N/A:N/A] Cleansing: Odor Anticipated Due to No [2:No] [N/A:N/A] Product Use: Wound Margin: [1:Flat and Intact] [2:Flat and Intact] [N/A:N/A] Granulation Amount: Large (67-100%) Large (67-100%) N/A Granulation Quality: Pink Pink N/A Necrotic Amount: Small (1-33%) Small (1-33%) N/A Necrotic Tissue: Eschar, Adherent Slough Eschar, Adherent Slough N/A Exposed  Structures: Fascia: No Fascia: No N/A Fat Layer (Subcutaneous Fat Layer (Subcutaneous Tissue) Exposed: No Tissue) Exposed: No Tendon: No Tendon: No Muscle: No Muscle: No Joint: No Joint: No Bone: No Bone: No Limited to Skin Limited to Skin Breakdown Breakdown Epithelialization: None None N/A Periwound Skin Texture: Excoriation: No Excoriation: No N/A Induration: No Induration: No Callus: No Callus: No Crepitus: No Crepitus: No Rash: No Rash: No Scarring: No Scarring: No Periwound Skin Maceration: No Maceration: No N/A Moisture: Dry/Scaly: No Dry/Scaly: No Periwound Skin Color: Atrophie Blanche: No Atrophie Blanche: No N/A Cyanosis: No Cyanosis: No Ecchymosis: No Ecchymosis: No Erythema: No Erythema: No Hemosiderin Staining: No Hemosiderin Staining: No Mottled: No Mottled: No Pallor: No Pallor: No Rubor: No Rubor: No Temperature: No Abnormality No Abnormality N/A Tenderness on Yes Yes N/A Palpation: Wound Preparation: Ulcer Cleansing: Ulcer Cleansing: N/A Rinsed/Irrigated with Rinsed/Irrigated with Saline Saline Topical Anesthetic Topical Anesthetic Applied: Other: lidocaine Applied: Other: lidocaine 4% 4% Treatment Notes Electronic Signature(s) Signed: 09/15/2016 5:34:29 PM By: Baltazar Najjar MD Entered By: Baltazar Najjar on 09/15/2016 14:04:17 Duplessis, Aizlyn ([id number]) -------------------------------------------------------------------------------- Multi-Disciplinary Care Plan Details Patient Name: [name] Date of Service: 09/15/2016 1:30 PM Medical Record Patient Account Number: [id number] Number: Treating RN: Curtis Sites August 09, 1935 (80 y.o. Other Clinician: Date of Birth/Sex: Female) Treating [name] Primary Care Patriciaann Rabanal: Duncan Dull Aul Mangieri/Extender: G Referring Torrance Frech: [name] Brick in Treatment: 4 Active Inactive ` Abuse / Safety / Falls / Self Care Management Nursing Diagnoses: Impaired  physical mobility Potential for falls Goals: Patient will remain injury free Date Initiated: 08/18/2016 Target Resolution Date: 10/29/2016 Goal Status: Active Interventions: Assess fall risk on admission and as needed Notes: ` Nutrition Nursing Diagnoses: Potential for alteratiion in Nutrition/Potential for imbalanced nutrition Goals: Patient/caregiver agrees to and verbalizes understanding of need to use nutritional supplements and/or vitamins as prescribed Date Initiated: 08/18/2016 Target Resolution Date: 10/29/2016 Goal Status: Active Interventions: Assess patient nutrition upon admission and as needed per policy Notes: ` Orientation to the Wound Care Program Killen, Mekisha ([id number]) Nursing Diagnoses: Knowledge deficit related to the wound healing center program Goals: Patient/caregiver will verbalize understanding of the Wound Healing Center Program Date Initiated: 08/18/2016 Target Resolution Date: 10/29/2016 Goal Status: Active Interventions: Provide education on orientation to the wound center Notes: ` Wound/Skin Impairment Nursing Diagnoses: Impaired tissue integrity Goals: Patient/caregiver will verbalize understanding of skin care regimen Date Initiated: 08/18/2016 Target Resolution Date: 10/29/2016 Goal Status: Active Ulcer/skin breakdown will have a volume reduction of 30% by week 4 Date Initiated: 08/18/2016 Target Resolution Date: 10/29/2016 Goal Status: Active Ulcer/skin breakdown will have a volume reduction of 50% by week 8 Date Initiated: 08/18/2016 Target Resolution Date: 10/29/2016 Goal Status: Active Ulcer/skin breakdown will have a volume  reduction of 80% by week 12 Date Initiated: 08/18/2016 Target Resolution Date: 10/29/2016 Goal Status: Active Ulcer/skin breakdown will heal within 14 [name] Date Initiated: 08/18/2016 Target Resolution Date: 10/29/2016 Goal Status: Active Interventions: Assess patient/caregiver ability to obtain necessary  supplies Assess patient/caregiver ability to perform ulcer/skin care regimen upon admission and as needed Assess ulceration(s) every visit Notes: Electronic Signature(s) Signed: 09/15/2016 5:24:32 PM By: Charlotta Newton, Alfonso ([id number]) Entered By: Curtis Sites on 09/15/2016 13:45:42 Lawn, Elzena ([id number]) -------------------------------------------------------------------------------- Pain Assessment Details Patient Name: [name] Date of Service: 09/15/2016 1:30 PM Medical Record Patient Account Number: [id number] Number: Treating RN: Curtis Sites [birth year] (80 y.o. Other Clinician: Date of Birth/Sex: Female) Treating [name] Primary Care Khandi Kernes: Duncan Dull Carden Teel/Extender: G Referring Nalla Purdy: [name] Brick in Treatment: 4 Active Problems Location of Pain Severity and Description of Pain Patient Has Paino No Site Locations Pain Management and Medication Current Pain Management: Notes Topical or injectable lidocaine is offered to patient for acute pain when surgical debridement is performed. If needed, Patient is instructed to use over the counter pain medication for the following 24-48 hours after debridement. Wound care MDs do not prescribed pain medications. Patient has chronic pain or uncontrolled pain. Patient has been instructed to make an appointment with their Primary Care Physician for pain management. Electronic Signature(s) Signed: 09/15/2016 5:24:32 PM By: Curtis Sites Entered By: Curtis Sites on 09/15/2016 13:37:31 Hartline, Otto ([id number]) -------------------------------------------------------------------------------- Patient/Caregiver Education Details Patient Name: [name] Date of Service: 09/15/2016 1:30 PM Medical Record Patient Account Number: [id number] Number: Treating RN: Curtis Sites 10/24/1935 (80 y.o. Other Clinician: Date of Birth/Gender: Female) Treating ROBSON,  [name] Primary Care Physician/Extender: Virgil Benedict Physician: Tania Ade in Treatment: 4 Referring Physician: Duncan Dull Education Assessment Education Provided To: Caregiver SNF staff via written orders Education Topics Provided Wound/Skin Impairment: Handouts: Other: new wound care as ordered Methods: Clinical cytogeneticist) Signed: 09/15/2016 5:24:32 PM By: Curtis Sites Entered By: Curtis Sites on 09/15/2016 14:21:57 Caponigro, Carole ([id number]) -------------------------------------------------------------------------------- Wound Assessment Details Patient Name: [name] Date of Service: 09/15/2016 1:30 PM Medical Record Patient Account Number: [id number] Number: Treating RN: Curtis Sites Feb 17, 1936 (80 y.o. Other Clinician: Date of Birth/Sex: Female) Treating [name] Primary Care Maleigha Colvard: Duncan Dull Berdine Rasmusson/Extender: G Referring Wilmetta Speiser: [name] Brick in Treatment: 4 Wound Status Wound Number: 1 Primary Pressure Ulcer Etiology: Wound Location: Right Calcaneus Wound Open Wounding Event: Pressure Injury Status: Date Acquired: 07/05/2016 Comorbid Cataracts, Anemia, Congestive Heart [name] Of Treatment: 4 History: Failure, Hypertension, Type II Diabetes, Clustered Wound: No Gout, Dementia, Neuropathy Photos Photo Uploaded By: Curtis Sites on 09/15/2016 15:54:38 Wound Measurements Length: (cm) 1.4 Width: (cm) 2.6 Depth: (cm) 0.2 Area: (cm) 2.859 Volume: (cm) 0.572 % Reduction in Area: 59.6% % Reduction in Volume: 59.5% Epithelialization: None Tunneling: No Undermining: No Wound Description Classification: Category/Stage III Foul Odor Afte Diabetic Severity (Wagner): Grade 1 Due to Product Wound Margin: Flat and Intact Slough/Fibrino Exudate Amount: Large Exudate Type: Serous Exudate Color: amber r Cleansing: Yes Use: No No Wound Bed Granulation Amount: Large (67-100%) Exposed Structure Viera,  Shameria ([id number]) Granulation Quality: Pink Fascia Exposed: No Necrotic Amount: Small (1-33%) Fat Layer (Subcutaneous Tissue) Exposed: No Necrotic Quality: Eschar, Adherent Slough Tendon Exposed: No Muscle Exposed: No Joint Exposed: No Bone Exposed: No Limited to Skin Breakdown Periwound Skin Texture Texture Color No Abnormalities Noted: No No Abnormalities Noted: No Callus: No Atrophie Blanche: No Crepitus: No Cyanosis: No Excoriation: No Ecchymosis: No Induration: No Erythema: No Rash: No Hemosiderin  Staining: No Scarring: No Mottled: No Pallor: No Moisture Rubor: No No Abnormalities Noted: No Dry / Scaly: No Temperature / Pain Maceration: No Temperature: No Abnormality Tenderness on Palpation: Yes Wound Preparation Ulcer Cleansing: Rinsed/Irrigated with Saline Topical Anesthetic Applied: Other: lidocaine 4%, Treatment Notes Wound #1 (Right Calcaneus) 1. Cleansed with: Clean wound with Normal Saline 2. Anesthetic Topical Lidocaine 4% cream to wound bed prior to debridement 4. Dressing Applied: Hydrafera Blue 5. Secondary Dressing Applied Dry Gauze Foam Kerlix/Conform 7. Secured with Tape Notes heel cup Electronic Signature(s) Signed: 09/15/2016 5:24:32 PM By: Charlotta Newton, T[place]ha ([id number]) Entered By: Curtis Sites on 09/15/2016 13:44:54 Dumais, Mykelti ([id number]) -------------------------------------------------------------------------------- Wound Assessment Details Patient Name: [name] Date of Service: 09/15/2016 1:30 PM Medical Record Patient Account Number: [id number] Number: Treating RN: Curtis Sites Jul 13, 1935 (80 y.o. Other Clinician: Date of Birth/Sex: Female) Treating [name] Primary Care Oda Placke: Duncan Dull Jagger Demonte/Extender: G Referring Zariana Strub: [name] Brick in Treatment: 4 Wound Status Wound Number: 2 Primary Pressure Ulcer Etiology: Wound Location: Left Calcaneus Wound  Open Wounding Event: Pressure Injury Status: Date Acquired: 07/05/2016 Comorbid Cataracts, Anemia, Congestive Heart [name] Of Treatment: 4 History: Failure, Hypertension, Type II Diabetes, Clustered Wound: No Gout, Dementia, Neuropathy Photos Photo Uploaded By: Curtis Sites on 09/15/2016 15:54:39 Wound Measurements Length: (cm) 2.3 Width: (cm) 3.3 Depth: (cm) 0.2 Area: (cm) 5.961 Volume: (cm) 1.192 % Reduction in Area: 45.8% % Reduction in Volume: 45.8% Epithelialization: None Tunneling: No Undermining: No Wound Description Classification: Category/Stage III Foul Odor Afte Diabetic Severity (Wagner): Grade 1 Due to Product Wound Margin: Flat and Intact Slough/Fibrino Exudate Amount: Large Exudate Type: Serous Exudate Color: amber r Cleansing: Yes Use: No No Wound Bed Granulation Amount: Large (67-100%) Exposed Structure Panas, Kumari ([id number]) Granulation Quality: Pink Fascia Exposed: No Necrotic Amount: Small (1-33%) Fat Layer (Subcutaneous Tissue) Exposed: No Necrotic Quality: Eschar, Adherent Slough Tendon Exposed: No Muscle Exposed: No Joint Exposed: No Bone Exposed: No Limited to Skin Breakdown Periwound Skin Texture Texture Color No Abnormalities Noted: No No Abnormalities Noted: No Callus: No Atrophie Blanche: No Crepitus: No Cyanosis: No Excoriation: No Ecchymosis: No Induration: No Erythema: No Rash: No Hemosiderin Staining: No Scarring: No Mottled: No Pallor: No Moisture Rubor: No No Abnormalities Noted: No Dry / Scaly: No Temperature / Pain Maceration: No Temperature: No Abnormality Tenderness on Palpation: Yes Wound Preparation Ulcer Cleansing: Rinsed/Irrigated with Saline Topical Anesthetic Applied: Other: lidocaine 4%, Treatment Notes Wound #2 (Left Calcaneus) 1. Cleansed with: Clean wound with Normal Saline 2. Anesthetic Topical Lidocaine 4% cream to wound bed prior to debridement 4. Dressing Applied: Hydrafera  Blue 5. Secondary Dressing Applied Dry Gauze Foam Kerlix/Conform 7. Secured with Tape Notes heel cup Electronic Signature(s) Signed: 09/15/2016 5:24:32 PM By: Charlotta Newton, [place] ([id number]) Entered By: Curtis Sites on 09/15/2016 13:45:05 Teem, Vernestine ([id number]) -------------------------------------------------------------------------------- Vitals Details Patient Name: [name] Date of Service: 09/15/2016 1:30 PM Medical Record Patient Account Number: [id number] Number: Treating RN: Curtis Sites Nov 21, 1935 (80 y.o. Other Clinician: Date of Birth/Sex: Female) Treating [name] Primary Care Margurete Guaman: Duncan Dull Faye Sanfilippo/Extender: G Referring Jazmarie Biever: [name] Brick in Treatment: 4 Vital Signs Time Taken: 13:37 Temperature (F): 97.8 Height (in): 66 Pulse (bpm): 70 Weight (lbs): 140.8 Respiratory Rate (breaths/min): 16 Body Mass Index (BMI): 22.7 Blood Pressure (mmHg): 124/48 Reference Range: 80 - 120 mg / dl Electronic Signature(s) Signed: 09/15/2016 5:24:32 PM By: Curtis Sites Entered By: Curtis Sites on 09/15/2016 13:37:55

## 2016-09-22 ENCOUNTER — Encounter: Payer: Medicare (Managed Care) | Admitting: Internal Medicine

## 2016-09-22 DIAGNOSIS — E11621 Type 2 diabetes mellitus with foot ulcer: Secondary | ICD-10-CM | POA: Diagnosis not present

## 2016-09-23 NOTE — Progress Notes (Signed)
[name], [name] ([id number]) Visit Report for 09/22/2016 Arrival Information Details Patient Name: [name], [name] Date of Service: 09/22/2016 2:30 PM Medical Record Patient Account Number: [id number] Number: Treating RN: Curtis Sites 1935/07/13 (80 y.o. Other Clinician: Date of Birth/Sex: Female) Treating ROBSON, [name] Primary Care Leslie Langille: Duncan Dull Karely Hurtado/Extender: G Referring Leann Mayweather: Denton Brick in Treatment: 5 Visit Information History Since Last Visit Added or deleted any medications: No Patient Arrived: Walker Any new allergies or adverse reactions: No Arrival Time: 14:25 Had a fall or experienced change in No Accompanied By: self activities of daily living that may affect Transfer Assistance: None risk of falls: Patient Identification Verified: Yes Signs or symptoms of abuse/neglect since last No Secondary Verification Process Completed: Yes visito Patient Requires Transmission-Based No Hospitalized since last visit: No Precautions: Has Dressing in Place as Prescribed: Yes Patient Has Alerts: Yes Pain Present Now: No Patient Alerts: DM II Electronic Signature(s) Signed: 09/22/2016 5:06:25 PM By: Curtis Sites Entered By: Curtis Sites on 09/22/2016 14:25:50 [name], [name] ([id number]) -------------------------------------------------------------------------------- Encounter Discharge Information Details Patient Name: [name] Date of Service: 09/22/2016 2:30 PM Medical Record Patient Account Number: [id number] Number: Treating RN: Curtis Sites 01/10/1936 (80 y.o. Other Clinician: Date of Birth/Sex: Female) Treating ROBSON, [name] Primary Care Chandra Feger: Duncan Dull Daylani Deblois/Extender: G Referring Roberts Bon: Denton Brick in Treatment: 5 Encounter Discharge Information Items Discharge Pain Level: 0 Discharge Condition: Stable Ambulatory Status: Walker Discharge Destination: Nursing Home Transportation:  Private Auto Accompanied By: self Schedule Follow-up Appointment: Yes Medication Reconciliation completed No and provided to Patient/Care Jamorian Dimaria: Provided on Clinical Summary of Care: 09/22/2016 Form Type Recipient Paper [facility] Electronic Signature(s) Signed: 09/22/2016 3:05:16 PM By: Gwenlyn Perking Previous Signature: 09/22/2016 2:38:32 PM Version By: Curtis Sites Entered By: Gwenlyn Perking on 09/22/2016 15:05:15 [name], [name] ([id number]) -------------------------------------------------------------------------------- Lower Extremity Assessment Details Patient Name: [name] Date of Service: 09/22/2016 2:30 PM Medical Record Patient Account Number: [id number] Number: Treating RN: Curtis Sites December 19, 1935 (80 y.o. Other Clinician: Date of Birth/Sex: Female) Treating ROBSON, [name] Primary Care Lucresia Simic: Duncan Dull Selia Wareing/Extender: G Referring Onie Hayashi: Denton Brick in Treatment: 5 Vascular Assessment Pulses: Dorsalis Pedis Palpable: [Left:Yes] [Right:Yes] Posterior Tibial Extremity colors, hair growth, and conditions: Extremity Color: [Left:Hyperpigmented] [Right:Hyperpigmented] Hair Growth on Extremity: [Left:No] [Right:No] Temperature of Extremity: [Left:Warm] [Right:Warm] Capillary Refill: [Left:< 3 seconds] [Right:< 3 seconds] Toe Nail Assessment Left: Right: Thick: Yes Yes Discolored: No No Deformed: No No Improper Length and Hygiene: No No Electronic Signature(s) Signed: 09/22/2016 2:37:26 PM By: Curtis Sites Entered By: Curtis Sites on 09/22/2016 14:37:26 [name], [name] ([id number]) -------------------------------------------------------------------------------- Multi Wound Chart Details Patient Name: [name] Date of Service: 09/22/2016 2:30 PM Medical Record Patient Account Number: [id number] Number: Treating RN: Curtis Sites 1935-07-28 (80 y.o. Other Clinician: Date of Birth/Sex: Female) Treating  ROBSON, [name] Primary Care Tatyanna Cronk: Duncan Dull Kyisha Fowle/Extender: G Referring Latica Hohmann: Denton Brick in Treatment: 5 Vital Signs Height(in): 66 Pulse(bpm): 76 Weight(lbs): 140.8 Blood Pressure 127/36 (mmHg): Body Mass Index(BMI): 23 Temperature(F): 97.5 Respiratory Rate 16 (breaths/min): Photos: [1:No Photos] [2:No Photos] [N/A:N/A] Wound Location: [1:Right Calcaneus] [2:Left Calcaneus] [N/A:N/A] Wounding Event: [1:Pressure Injury] [2:Pressure Injury] [N/A:N/A] Primary Etiology: [1:Pressure Ulcer] [2:Pressure Ulcer] [N/A:N/A] Comorbid History: [1:Cataracts, Anemia, Congestive Heart Failure, Congestive Heart Failure, Hypertension, Type II Diabetes, Gout, Dementia, Diabetes, Gout, Dementia, Neuropathy] [2:Cataracts, Anemia, Hypertension, Type II Neuropathy] [N/A:N/A] Date Acquired: [1:07/05/2016] [2:07/05/2016] [N/A:N/A] Weeks of Treatment: [1:5] [2:5] [N/A:N/A] Wound Status: [1:Open] [2:Open] [N/A:N/A] Measurements L x W x D 1.4x2.6x0.2 [2:2.3x3.1x0.1] [N/A:N/A] (cm) Area (cm) : [1:2.859] [  2:5.6] [N/A:N/A] Volume (cm) : [1:0.572] [2:0.56] [N/A:N/A] % Reduction in Area: [1:59.60%] [2:49.10%] [N/A:N/A] % Reduction in Volume: 59.50% [2:74.50%] [N/A:N/A] Classification: [1:Category/Stage III] [2:Category/Stage III] [N/A:N/A] HBO Classification: [1:Grade 1] [2:Grade 1] [N/A:N/A] Exudate Amount: [1:Large] [2:Large] [N/A:N/A] Exudate Type: [1:Serous] [2:Serous] [N/A:N/A] Exudate Color: [1:amber] [2:amber] [N/A:N/A] Foul Odor After [1:Yes] [2:Yes] [N/A:N/A] Cleansing: Odor Anticipated Due to No [2:No] [N/A:N/A] Product Use: Wound Margin: [1:Flat and Intact] [2:Flat and Intact] [N/A:N/A] Granulation Amount: Large (67-100%) Large (67-100%) N/A Granulation Quality: Pink Pink N/A Necrotic Amount: Small (1-33%) Small (1-33%) N/A Necrotic Tissue: Eschar, Adherent Slough Eschar, Adherent Slough N/A Exposed Structures: Fascia: No Fascia: No N/A Fat Layer (Subcutaneous  Fat Layer (Subcutaneous Tissue) Exposed: No Tissue) Exposed: No Tendon: No Tendon: No Muscle: No Muscle: No Joint: No Joint: No Bone: No Bone: No Limited to Skin Limited to Skin Breakdown Breakdown Epithelialization: None None N/A Debridement: Debridement (40981- Debridement (19147- N/A 11047) 11047) Pre-procedure 14:41 14:44 N/A Verification/Time Out Taken: Pain Control: Lidocaine 4% Topical Lidocaine 4% Topical N/A Solution Solution Tissue Debrided: Fibrin/Slough, Fibrin/Slough, N/A Subcutaneous Subcutaneous Level: Skin/Subcutaneous Skin/Subcutaneous N/A Tissue Tissue Debridement Area (sq 3.64 7.13 N/A cm): Instrument: Curette Curette N/A Bleeding: Moderate Moderate N/A Hemostasis Achieved: Pressure Pressure N/A Procedural Pain: 0 0 N/A Post Procedural Pain: 0 0 N/A Debridement Treatment Procedure was tolerated Procedure was tolerated N/A Response: well well Post Debridement 1.4x2.6x0.2 2.3x3.1x0.3 N/A Measurements L x W x D (cm) Post Debridement 0.572 1.68 N/A Volume: (cm) Post Debridement Category/Stage III Category/Stage III N/A Stage: Periwound Skin Texture: Excoriation: No Excoriation: No N/A Induration: No Induration: No Callus: No Callus: No Crepitus: No Crepitus: No Rash: No Rash: No Scarring: No Scarring: No Periwound Skin Maceration: No Maceration: No N/A Moisture: Dry/Scaly: No Dry/Scaly: No Periwound Skin Color: N/A [name], [name] ([id number]) Atrophie Blanche: No Atrophie Blanche: No Cyanosis: No Cyanosis: No Ecchymosis: No Ecchymosis: No Erythema: No Erythema: No Hemosiderin Staining: No Hemosiderin Staining: No Mottled: No Mottled: No Pallor: No Pallor: No Rubor: No Rubor: No Temperature: No Abnormality No Abnormality N/A Tenderness on Yes Yes N/A Palpation: Wound Preparation: Ulcer Cleansing: Ulcer Cleansing: N/A Rinsed/Irrigated with Rinsed/Irrigated with Saline Saline Topical Anesthetic Topical  Anesthetic Applied: Other: lidocaine Applied: Other: lidocaine 4% 4% Procedures Performed: Debridement Debridement N/A Treatment Notes Wound #1 (Right Calcaneus) 1. Cleansed with: Clean wound with Normal Saline 2. Anesthetic Topical Lidocaine 4% cream to wound bed prior to debridement 4. Dressing Applied: Hydrafera Blue 5. Secondary Dressing Applied Dry Gauze Foam Kerlix/Conform 7. Secured with Tape Notes heel cup Wound #2 (Left Calcaneus) 1. Cleansed with: Clean wound with Normal Saline 2. Anesthetic Topical Lidocaine 4% cream to wound bed prior to debridement 4. Dressing Applied: Hydrafera Blue 5. Secondary Dressing Applied Dry Gauze Foam Kerlix/Conform [name], [name] ([id number]) 7. Secured with Tape Notes heel cup Electronic Signature(s) Signed: 09/22/2016 4:44:14 PM By: Baltazar Najjar MD Previous Signature: 09/22/2016 2:38:12 PM Version By: Curtis Sites Entered By: Baltazar Najjar on 09/22/2016 15:42:21 [name], [name] ([id number]) -------------------------------------------------------------------------------- Multi-Disciplinary Care Plan Details Patient Name: [name] Date of Service: 09/22/2016 2:30 PM Medical Record Patient Account Number: [id number] Number: Treating RN: Curtis Sites 1936-01-24 (80 y.o. Other Clinician: Date of Birth/Sex: Female) Treating ROBSON, [name] Primary Care Cimone Fahey: Duncan Dull Dravon Nott/Extender: G Referring Hiren Peplinski: Denton Brick in Treatment: 5 Active Inactive ` Abuse / Safety / Falls / Self Care Management Nursing Diagnoses: Impaired physical mobility Potential for falls Goals: Patient will remain injury free Date Initiated: 08/18/2016 Target Resolution Date: 10/29/2016 Goal Status: Active Interventions: Assess  fall risk on admission and as needed Notes: ` Nutrition Nursing Diagnoses: Potential for alteratiion in Nutrition/Potential for imbalanced nutrition Goals: Patient/caregiver  agrees to and verbalizes understanding of need to use nutritional supplements and/or vitamins as prescribed Date Initiated: 08/18/2016 Target Resolution Date: 10/29/2016 Goal Status: Active Interventions: Assess patient nutrition upon admission and as needed per policy Notes: ` Orientation to the Wound Care Program Corydon, Kamilla ([id number]) Nursing Diagnoses: Knowledge deficit related to the wound healing center program Goals: Patient/caregiver will verbalize understanding of the Wound Healing Center Program Date Initiated: 08/18/2016 Target Resolution Date: 10/29/2016 Goal Status: Active Interventions: Provide education on orientation to the wound center Notes: ` Wound/Skin Impairment Nursing Diagnoses: Impaired tissue integrity Goals: Patient/caregiver will verbalize understanding of skin care regimen Date Initiated: 08/18/2016 Target Resolution Date: 10/29/2016 Goal Status: Active Ulcer/skin breakdown will have a volume reduction of 30% by week 4 Date Initiated: 08/18/2016 Target Resolution Date: 10/29/2016 Goal Status: Active Ulcer/skin breakdown will have a volume reduction of 50% by week 8 Date Initiated: 08/18/2016 Target Resolution Date: 10/29/2016 Goal Status: Active Ulcer/skin breakdown will have a volume reduction of 80% by week 12 Date Initiated: 08/18/2016 Target Resolution Date: 10/29/2016 Goal Status: Active Ulcer/skin breakdown will heal within 14 weeks Date Initiated: 08/18/2016 Target Resolution Date: 10/29/2016 Goal Status: Active Interventions: Assess patient/caregiver ability to obtain necessary supplies Assess patient/caregiver ability to perform ulcer/skin care regimen upon admission and as needed Assess ulceration(s) every visit Notes: Electronic Signature(s) Signed: 09/22/2016 2:38:03 PM By: Charlotta Newton, Paizley ([id number]) Entered By: Curtis Sites on 09/22/2016 14:38:02 Gang, Anam  ([id number]) -------------------------------------------------------------------------------- Pain Assessment Details Patient Name: [name] Date of Service: 09/22/2016 2:30 PM Medical Record Patient Account Number: [id number] Number: Treating RN: Curtis Sites [birth year] (80 y.o. Other Clinician: Date of Birth/Sex: Female) Treating ROBSON, [name] Primary Care Aristeo Hankerson: Duncan Dull Hildagard Sobecki/Extender: G Referring Teran Daughenbaugh: Denton Brick in Treatment: 5 Active Problems Location of Pain Severity and Description of Pain Patient Has Paino No Site Locations Pain Management and Medication Current Pain Management: Notes Topical or injectable lidocaine is offered to patient for acute pain when surgical debridement is performed. If needed, Patient is instructed to use over the counter pain medication for the following 24-48 hours after debridement. Wound care MDs do not prescribed pain medications. Patient has chronic pain or uncontrolled pain. Patient has been instructed to make an appointment with their Primary Care Physician for pain management. Electronic Signature(s) Signed: 09/22/2016 5:06:25 PM By: Curtis Sites Entered By: Curtis Sites on 09/22/2016 14:26:45 [name], [name] ([id number]) -------------------------------------------------------------------------------- Patient/Caregiver Education Details Patient Name: [name] Date of Service: 09/22/2016 2:30 PM Medical Record Patient Account Number: [id number] Number: Treating RN: Curtis Sites [birth year] (80 y.o. Other Clinician: Date of Birth/Gender: Female) Treating ROBSON, [name] Primary Care Physician/Extender: Virgil Benedict Physician: Tania Ade in Treatment: 5 Referring Physician: Duncan Dull Education Assessment Education Provided To: Caregiver SNF nurses via written orders Education Topics Provided Wound/Skin Impairment: Handouts: Other: wound care as ordered Methods:  Clinical cytogeneticist) Signed: 09/22/2016 5:06:25 PM By: Curtis Sites Entered By: Curtis Sites on 09/22/2016 14:38:58 [name], [name] ([id number]) -------------------------------------------------------------------------------- Wound Assessment Details Patient Name: [name] Date of Service: 09/22/2016 2:30 PM Medical Record Patient Account Number: [id number] Number: Treating RN: Curtis Sites 1935-07-06 (80 y.o. Other Clinician: Date of Birth/Sex: Female) Treating ROBSON, [name] Primary Care Vu Liebman: Duncan Dull Tashanti Dalporto/Extender: G Referring Fusae Florio: Denton Brick in Treatment: 5 Wound Status Wound Number: 1 Primary Pressure Ulcer Etiology: Wound Location: Right Calcaneus Wound Open Wounding Event:  Pressure Injury Status: Date Acquired: 07/05/2016 Comorbid Cataracts, Anemia, Congestive Heart Weeks Of Treatment: 5 History: Failure, Hypertension, Type II Diabetes, Clustered Wound: No Gout, Dementia, Neuropathy Photos Photo Uploaded By: Curtis Sites on 09/22/2016 15:58:39 Wound Measurements Length: (cm) 1.4 Width: (cm) 2.6 Depth: (cm) 0.2 Area: (cm) 2.859 Volume: (cm) 0.572 % Reduction in Area: 59.6% % Reduction in Volume: 59.5% Epithelialization: None Tunneling: No Undermining: No Wound Description Classification: Category/Stage III Foul Odor Afte Diabetic Severity (Wagner): Grade 1 Due to Product Wound Margin: Flat and Intact Slough/Fibrino Exudate Amount: Large Exudate Type: Serous Exudate Color: amber r Cleansing: Yes Use: No No Wound Bed Granulation Amount: Large (67-100%) Exposed Structure [name], [name] Granulation Quality: Pink Fascia Exposed: No Necrotic Amount: Small (1-33%) Fat Layer (Subcutaneous Tissue) Exposed: No Necrotic Quality: Eschar, Adherent Slough Tendon Exposed: No Muscle Exposed: No Joint Exposed: No Bone Exposed: No Limited to Skin Breakdown Periwound Skin Texture Texture  Color No Abnormalities Noted: No No Abnormalities Noted: No Callus: No Atrophie Blanche: No Crepitus: No Cyanosis: No Excoriation: No Ecchymosis: No Induration: No Erythema: No Rash: No Hemosiderin Staining: No Scarring: No Mottled: No Pallor: No Moisture Rubor: No No Abnormalities Noted: No Dry / Scaly: No Temperature / Pain Maceration: No Temperature: No Abnormality Tenderness on Palpation: Yes Wound Preparation Ulcer Cleansing: Rinsed/Irrigated with Saline Topical Anesthetic Applied: Other: lidocaine 4%, Treatment Notes Wound #1 (Right Calcaneus) 1. Cleansed with: Clean wound with Normal Saline 2. Anesthetic Topical Lidocaine 4% cream to wound bed prior to debridement 4. Dressing Applied: Hydrafera Blue 5. Secondary Dressing Applied Dry Gauze Foam Kerlix/Conform 7. Secured with Tape Notes heel cup Electronic Signature(s) Signed: 09/22/2016 2:37:43 PM By: Charlotta Newton, Bethania ([id number]) Entered By: Curtis Sites on 09/22/2016 14:37:43 [name], [name] ([id number]) -------------------------------------------------------------------------------- Wound Assessment Details Patient Name: [name] Date of Service: 09/22/2016 2:30 PM Medical Record Patient Account Number: [id number] Number: Treating RN: Curtis Sites 1935/10/23 (80 y.o. Other Clinician: Date of Birth/Sex: Female) Treating ROBSON, [name] Primary Care Yerlin Gasparyan: Duncan Dull Rustin Erhart/Extender: G Referring Anorah Trias: Denton Brick in Treatment: 5 Wound Status Wound Number: 2 Primary Pressure Ulcer Etiology: Wound Location: Left Calcaneus Wound Open Wounding Event: Pressure Injury Status: Date Acquired: 07/05/2016 Comorbid Cataracts, Anemia, Congestive Heart Weeks Of Treatment: 5 History: Failure, Hypertension, Type II Diabetes, Clustered Wound: No Gout, Dementia, Neuropathy Photos Photo Uploaded By: Curtis Sites on 09/22/2016 15:58:39 Wound  Measurements Length: (cm) 2.3 Width: (cm) 3.1 Depth: (cm) 0.1 Area: (cm) 5.6 Volume: (cm) 0.56 % Reduction in Area: 49.1% % Reduction in Volume: 74.5% Epithelialization: None Tunneling: No Undermining: No Wound Description Classification: Category/Stage III Foul Odor Afte Diabetic Severity (Wagner): Grade 1 Due to Product Wound Margin: Flat and Intact Slough/Fibrino Exudate Amount: Large Exudate Type: Serous Exudate Color: amber r Cleansing: Yes Use: No No Wound Bed Granulation Amount: Large (67-100%) Exposed Structure [name], [name] ([id number]) Granulation Quality: Pink Fascia Exposed: No Necrotic Amount: Small (1-33%) Fat Layer (Subcutaneous Tissue) Exposed: No Necrotic Quality: Eschar, Adherent Slough Tendon Exposed: No Muscle Exposed: No Joint Exposed: No Bone Exposed: No Limited to Skin Breakdown Periwound Skin Texture Texture Color No Abnormalities Noted: No No Abnormalities Noted: No Callus: No Atrophie Blanche: No Crepitus: No Cyanosis: No Excoriation: No Ecchymosis: No Induration: No Erythema: No Rash: No Hemosiderin Staining: No Scarring: No Mottled: No Pallor: No Moisture Rubor: No No Abnormalities Noted: No Dry / Scaly: No Temperature / Pain Maceration: No Temperature: No Abnormality Tenderness on Palpation: Yes Wound Preparation Ulcer Cleansing: Rinsed/Irrigated with Saline Topical Anesthetic Applied: Other:  lidocaine 4%, Treatment Notes Wound #2 (Left Calcaneus) 1. Cleansed with: Clean wound with Normal Saline 2. Anesthetic Topical Lidocaine 4% cream to wound bed prior to debridement 4. Dressing Applied: Hydrafera Blue 5. Secondary Dressing Applied Dry Gauze Foam Kerlix/Conform 7. Secured with Tape Notes heel cup Electronic Signature(s) Signed: 09/22/2016 2:37:54 PM By: Charlotta Newtonorthy, [name], [name] ([id number]) Entered By: Curtis Sitesorthy, [name] on 09/22/2016 14:37:54 Loeza, Zyanne  ([id number]) -------------------------------------------------------------------------------- Vitals Details Patient Name: [name] LoboHEELY, Camela Date of Service: 09/22/2016 2:30 PM Medical Record Patient Account Number: [id number] Number: Treating RN: Curtis SitesDorthy, [name] [birth year] (80 y.o. Other Clinician: Date of Birth/Sex: Female) Treating ROBSON, [name] Primary Care Kosta Schnitzler: Duncan Dullullo, Teresa Undine Nealis/Extender: G Referring Sherryn Pollino: Denton Brickullo, Teresa Weeks in Treatment: 5 Vital Signs Time Taken: 14:27 Temperature (F): 97.5 Height (in): 66 Pulse (bpm): 76 Weight (lbs): 140.8 Respiratory Rate (breaths/min): 16 Body Mass Index (BMI): 22.7 Blood Pressure (mmHg): 127/36 Reference Range: 80 - 120 mg / dl Notes manual BP 644/03124/40 on recheck Electronic Signature(s) Signed: 09/22/2016 5:06:25 PM By: Curtis Sitesorthy, [name] Entered By: Curtis Sitesorthy, [name] on 09/22/2016 14:29:04

## 2016-09-23 NOTE — Progress Notes (Signed)
[name], [name] ([id number]) Visit Report for 09/22/2016 Chief Complaint Document Details Patient Name: [name], [name] Date of Service: 09/22/2016 2:30 PM Medical Record Patient Account Number: [id number] Number: Treating RN: Curtis Sites 07-09-1935 (80 y.o. Other Clinician: Date of Birth/Sex: Female) Treating ROBSON, [name] Primary Care Provider: Duncan Dull Provider/Extender: G Referring Provider: Denton Brick in Treatment: 5 Information Obtained from: Patient Chief Complaint 08/18/16; patient is here for review of bilateral heel ulcers Electronic Signature(s) Signed: 09/22/2016 4:44:14 PM By: Baltazar Najjar MD Entered By: Baltazar Najjar on 09/22/2016 15:42:53 [name], [name] ([id number]) -------------------------------------------------------------------------------- Debridement Details Patient Name: [name] Date of Service: 09/22/2016 2:30 PM Medical Record Patient Account Number: [id number] Number: Treating RN: Curtis Sites 1936-01-17 (80 y.o. Other Clinician: Date of Birth/Sex: Female) Treating ROBSON, [name] Primary Care Provider: Duncan Dull Provider/Extender: G Referring Provider: Denton Brick in Treatment: 5 Debridement Performed for Wound #1 Right Calcaneus Assessment: Performed By: Physician Maxwell Caul, MD Debridement: Debridement Pre-procedure Verification/Time Out Yes - 14:41 Taken: Start Time: 14:41 Pain Control: Lidocaine 4% Topical Solution Level: Skin/Subcutaneous Tissue Total Area Debrided (L x 1.4 (cm) x 2.6 (cm) = 3.64 (cm) W): Tissue and other Viable, Non-Viable, Fibrin/Slough, Subcutaneous material debrided: Instrument: Curette Bleeding: Moderate Hemostasis Achieved: Pressure End Time: 14:44 Procedural Pain: 0 Post Procedural Pain: 0 Response to Treatment: Procedure was tolerated well Post Debridement Measurements of Total Wound Length: (cm) 1.4 Stage: Category/Stage III Width:  (cm) 2.6 Depth: (cm) 0.2 Volume: (cm) 0.572 Character of Wound/Ulcer Post Improved Debridement: Post Procedure Diagnosis Same as Pre-procedure Electronic Signature(s) Signed: 09/22/2016 4:44:14 PM By: Baltazar Najjar MD Signed: 09/22/2016 5:06:25 PM By: Curtis Sites Entered By: Baltazar Najjar on 09/22/2016 15:42:32 [name], [name] ([id number]) [name], [name] ([id number]) -------------------------------------------------------------------------------- Debridement Details Patient Name: [name] Date of Service: 09/22/2016 2:30 PM Medical Record Patient Account Number: [id number] Number: Treating RN: Curtis Sites Dec 12, 1935 (80 y.o. Other Clinician: Date of Birth/Sex: Female) Treating ROBSON, [name] Primary Care Provider: Duncan Dull Provider/Extender: G Referring Provider: Denton Brick in Treatment: 5 Debridement Performed for Wound #2 Left Calcaneus Assessment: Performed By: Physician Maxwell Caul, MD Debridement: Debridement Pre-procedure Verification/Time Out Yes - 14:44 Taken: Start Time: 14:44 Pain Control: Lidocaine 4% Topical Solution Level: Skin/Subcutaneous Tissue Total Area Debrided (L x 2.3 (cm) x 3.1 (cm) = 7.13 (cm) W): Tissue and other Viable, Non-Viable, Fibrin/Slough, Subcutaneous material debrided: Instrument: Curette Bleeding: Moderate Hemostasis Achieved: Pressure End Time: 14:46 Procedural Pain: 0 Post Procedural Pain: 0 Response to Treatment: Procedure was tolerated well Post Debridement Measurements of Total Wound Length: (cm) 2.3 Stage: Category/Stage III Width: (cm) 3.1 Depth: (cm) 0.3 Volume: (cm) 1.68 Character of Wound/Ulcer Post Improved Debridement: Post Procedure Diagnosis Same as Pre-procedure Electronic Signature(s) Signed: 09/22/2016 4:44:14 PM By: Baltazar Najjar MD Signed: 09/22/2016 5:06:25 PM By: Curtis Sites Entered By: Baltazar Najjar on 09/22/2016 15:42:44 Ruybal, [name]  ([id number]) [place]io[place], Jacelyn ([id number]) -------------------------------------------------------------------------------- HPI Details Patient Name: [name] Date of Service: 09/22/2016 2:30 PM Medical Record Patient Account Number: [id number] Number: Treating RN: Curtis Sites November 24, 1935 (80 y.o. Other Clinician: Date of Birth/Sex: Female) Treating ROBSON, [name] Primary Care Provider: Duncan Dull Provider/Extender: G Referring Provider: Denton Brick in Treatment: 5 History of Present Illness HPI Description: 08/18/16; this is a 81 year old woman who is a type II diabetic not currently on any treatment. She is also listed as having Alzheimer's disease hypertension. She has a history of chronic venous insufficiency with leg wounds in the past but no history of wounds in her feet.  In terms of her diabetes at one point she was on insulin however she is no longer on any current treatment, her daughter who is providing most of the history is unaware of her hemoglobin A1c. She has stage IV chronic renal failure and follows with nephrology. The history provided by her daughter is that she has had a wound on the left heel perhaps dating back to last May/17. At that point she was in the hospital predominantly with psychiatric issues and was discharged [facility]. This was treated with some form of foam dressing and may have actually healed however she was readmitted to hospital in January with Escherichia coli sepsis felt to be secondary to a UTI. Since then she is in [facility] skilled facility. Apparently this timeframe both the left heel and right heel reopened or at least the daughter became aware that they were both open. The exact timeframe isn't really certain Her ABIs in this clinic were 1.08 on the right 1.25 on the left. Looking through [employer] doesn't really provide any useful information. She did have venous ultrasounds in 2016 that did not show  a DVT. I don't see a recent hemoglobin A1c 08/25/16; from last week we have been using Santyl to both heels. Apparently the nursing home didn't get an x-ray of both heels [[facility]] and the daughter states that there was "no injury to bone" but for some reason we haven't been able to get the official report from them. 09/01/16; continued improvement in both wounds on her bilateral heels using Santyl. X-ray report was negative for osteomyelitis 09/08/16; patient from [facility] skilled facility. Using Santyl on both her heel wounds which are fortunately not on the plantar surface. 09/15/16; patient is from [facility] skilled facility. She has been using Santyl on both her pressure areas which were stage III. Fortunately these or not on the plantar surface. 09/22/16; patient arrives today with a much less viable looking surface on her heels. We had been using Santyl with good improvement however unchanged either fair last week. This may be unrelated however she required debridement today. Electronic Signature(s) Signed: 09/22/2016 4:44:14 PM By: Baltazar Najjar MD Entered By: Baltazar Najjar on 09/22/2016 15:45:58 Karges, Veria ([id number]) -------------------------------------------------------------------------------- Physical Exam Details Patient Name: [name] Date of Service: 09/22/2016 2:30 PM Medical Record Patient Account Number: [id number] Number: Treating RN: Curtis Sites 07-14-1935 (80 y.o. Other Clinician: Date of Birth/Sex: Female) Treating ROBSON, [name] Primary Care Provider: Duncan Dull Provider/Extender: G Referring Provider: Denton Brick in Treatment: 5 Constitutional Sitting or standing Blood Pressure is within target range for patient.. Pulse regular and within target range for patient.Marland Kitchen Respirations regular, non-labored and within target range.. Temperature is normal and within the target range for the patient.Marland Kitchen appears in no  distress. Cardiovascular Pedal pulses palpable and strong bilaterally.. Notes When exam; the patient has bilateral heel wounds which were both presented as stage III pressure ulcers. Unfortunately the patient required an aggressive debridement of the wound services today. There is no evidence of surrounding infection. Electronic Signature(s) Signed: 09/22/2016 4:44:14 PM By: Baltazar Najjar MD Entered By: Baltazar Najjar on 09/22/2016 15:47:06 Glidden, [name] ([id number]) -------------------------------------------------------------------------------- Physician Orders Details Patient Name: [name] Date of Service: 09/22/2016 2:30 PM Medical Record Patient Account Number: [id number] Number: Treating RN: Curtis Sites Apr 12, 1936 (80 y.o. Other Clinician: Date of Birth/Sex: Female) Treating ROBSON, [name] Primary Care Provider: Duncan Dull Provider/Extender: G Referring Provider: Denton Brick in Treatment: 5 Verbal / Phone Orders: No  Diagnosis Coding Wound Cleansing Wound #1 Right Calcaneus o Clean wound with Normal Saline. o May Shower, gently pat wound dry prior to applying new dressing. Wound #2 Left Calcaneus o Clean wound with Normal Saline. o May Shower, gently pat wound dry prior to applying new dressing. Anesthetic Wound #1 Right Calcaneus o Topical Lidocaine 4% cream applied to wound bed prior to debridement Wound #2 Left Calcaneus o Topical Lidocaine 4% cream applied to wound bed prior to debridement Primary Wound Dressing Wound #1 Right Calcaneus o Hydrafera Blue Wound #2 Left Calcaneus o Hydrafera Blue Secondary Dressing Wound #1 Right Calcaneus o Gauze and Kerlix/Conform o Foam - heel cups Wound #2 Left Calcaneus o Gauze and Kerlix/Conform o Foam - heel cups Dressing Change Frequency Wound #1 Right Calcaneus Pilger, Jackelyn ([id number]) o Change dressing every day. Wound #2 Left Calcaneus o Change  dressing every day. Follow-up Appointments Wound #1 Right Calcaneus o Return Appointment in 1 week. Wound #2 Left Calcaneus o Return Appointment in 1 week. Edema Control Wound #1 Right Calcaneus o Elevate legs to the level of the heart and pump ankles as often as possible Wound #2 Left Calcaneus o Elevate legs to the level of the heart and pump ankles as often as possible Off-Loading Wound #1 Right Calcaneus o Turn and reposition every 2 hours o Other: - wear heel protector boots at al times in bed, float heels while in bed, Wound #2 Left Calcaneus o Turn and reposition every 2 hours o Other: - wear heel protector boots at al times in bed, float heels while in bed, Additional Orders / Instructions Wound #1 Right Calcaneus o Increase protein intake. o Other: - please add vitamin A, vitamin C and zinc supplements to your diet Wound #2 Left Calcaneus o Increase protein intake. o Other: - please add vitamin A, vitamin C and zinc supplements to your diet Electronic Signature(s) Signed: 09/22/2016 4:44:14 PM By: Baltazar Najjarobson, [name] MD Signed: 09/22/2016 5:06:25 PM By: Curtis Sitesorthy, Joanna Entered By: Curtis Sitesorthy, Joanna on 09/22/2016 14:49:39 Cavanaugh, Astha ([id number]) -------------------------------------------------------------------------------- Problem List Details Patient Name: [name] LoboHEELY, Harlyn Date of Service: 09/22/2016 2:30 PM Medical Record Patient Account Number: [id number] Number: Treating RN: Curtis SitesDorthy, Joanna November 21, 1935 (80 y.o. Other Clinician: Date of Birth/Sex: Female) Treating ROBSON, [name] Primary Care Provider: Duncan Dullullo, Teresa Provider/Extender: G Referring Provider: Denton Brickullo, Teresa Weeks in Treatment: 5 Active Problems ICD-10 Encounter Code Description Active Date Diagnosis E11.621 Type 2 diabetes mellitus with foot ulcer 08/18/2016 Yes L89.623 Pressure ulcer of left heel, stage 3 08/18/2016 Yes L89.613 Pressure ulcer of right heel, stage 3  08/18/2016 Yes Inactive Problems Resolved Problems Electronic Signature(s) Signed: 09/22/2016 4:44:14 PM By: Baltazar Najjarobson, [name] MD Entered By: Baltazar Najjarobson, [name] on 09/22/2016 15:42:09 Selvy, Larene ([id number]) -------------------------------------------------------------------------------- Progress Note Details Patient Name: [name] LoboHEELY, Jalyssa Date of Service: 09/22/2016 2:30 PM Medical Record Patient Account Number: [id number] Number: Treating RN: Curtis Sitesorthy, Joanna November 21, 1935 (80 y.o. Other Clinician: Date of Birth/Sex: Female) Treating ROBSON, [name] Primary Care Provider: Duncan Dullullo, Teresa Provider/Extender: G Referring Provider: Denton Brickullo, Teresa Weeks in Treatment: 5 Subjective Chief Complaint Information obtained from Patient 08/18/16; patient is here for review of bilateral heel ulcers History of Present Illness (HPI) 08/18/16; this is a 81 year old woman who is a type II diabetic not currently on any treatment. She is also listed as having Alzheimer's disease hypertension. She has a history of chronic venous insufficiency with leg wounds in the past but no history of wounds in her feet. In terms of her diabetes at one point she was on  insulin however she is no longer on any current treatment, her daughter who is providing most of the history is unaware of her hemoglobin A1c. She has stage IV chronic renal failure and follows with nephrology. The history provided by her daughter is that she has had a wound on the left heel perhaps dating back to last May/17. At that point she was in the hospital predominantly with psychiatric issues and was discharged [facility]. This was treated with some form of foam dressing and may have actually healed however she was readmitted to hospital in January with Escherichia coli sepsis felt to be secondary to a UTI. Since then she is in [facility] skilled facility. Apparently this timeframe both the left heel and right heel reopened or at least the  daughter became aware that they were both open. The exact timeframe isn't really certain Her ABIs in this clinic were 1.08 on the right 1.25 on the left. Looking through [employer] doesn't really provide any useful information. She did have venous ultrasounds in 2016 that did not show a DVT. I don't see a recent hemoglobin A1c 08/25/16; from last week we have been using Santyl to both heels. Apparently the nursing home didn't get an x-ray of both heels [[facility]] and the daughter states that there was "no injury to bone" but for some reason we haven't been able to get the official report from them. 09/01/16; continued improvement in both wounds on her bilateral heels using Santyl. X-ray report was negative for osteomyelitis 09/08/16; patient from [facility] skilled facility. Using Santyl on both her heel wounds which are fortunately not on the plantar surface. 09/15/16; patient is from [facility] skilled facility. She has been using Santyl on both her pressure areas which were stage III. Fortunately these or not on the plantar surface. 09/22/16; patient arrives today with a much less viable looking surface on her heels. We had been using Santyl with good improvement however unchanged either fair last week. This may be unrelated however she required debridement today. [name], [name] Objective Constitutional Sitting or standing Blood Pressure is within target range for patient.. Pulse regular and within target range for patient.Marland Kitchen Respirations regular, non-labored and within target range.. Temperature is normal and within the target range for the patient.Marland Kitchen appears in no distress. Vitals Time Taken: 2:27 PM, Height: 66 in, Weight: 140.8 lbs, BMI: 22.7, Temperature: 97.5 F, Pulse: 76 bpm, Respiratory Rate: 16 breaths/min, Blood Pressure: 127/36 mmHg. General Notes: manual BP 124/40 on recheck Cardiovascular Pedal pulses palpable and strong bilaterally.. General  Notes: When exam; the patient has bilateral heel wounds which were both presented as stage III pressure ulcers. Unfortunately the patient required an aggressive debridement of the wound services today. There is no evidence of surrounding infection. Integumentary (Hair, Skin) Wound #1 status is Open. Original cause of wound was Pressure Injury. The wound is located on the Right Calcaneus. The wound measures 1.4cm length x 2.6cm width x 0.2cm depth; 2.859cm^2 area and 0.572cm^3 volume. The wound is limited to skin breakdown. There is no tunneling or undermining noted. There is a large amount of serous drainage noted. The wound margin is flat and intact. There is large (67- 100%) pink granulation within the wound bed. There is a small (1-33%) amount of necrotic tissue within the wound bed including Eschar and Adherent Slough. The periwound skin appearance did not exhibit: Callus, Crepitus, Excoriation, Induration, Rash, Scarring, Dry/Scaly, Maceration, Atrophie Blanche, Cyanosis, Ecchymosis, Hemosiderin Staining, Mottled, Pallor, Rubor, Erythema.  Periwound temperature was noted as No Abnormality. The periwound has tenderness on palpation. Wound #2 status is Open. Original cause of wound was Pressure Injury. The wound is located on the Left Calcaneus. The wound measures 2.3cm length x 3.1cm width x 0.1cm depth; 5.6cm^2 area and 0.56cm^3 volume. The wound is limited to skin breakdown. There is no tunneling or undermining noted. There is a large amount of serous drainage noted. The wound margin is flat and intact. There is large (67-100%) pink granulation within the wound bed. There is a small (1-33%) amount of necrotic tissue within the wound bed including Eschar and Adherent Slough. The periwound skin appearance did not exhibit: Callus, Crepitus, Excoriation, Induration, Rash, Scarring, Dry/Scaly, Maceration, Atrophie Blanche, Cyanosis, Ecchymosis, Hemosiderin Staining, Mottled, Pallor, Rubor,  Erythema. Periwound temperature was noted as No Abnormality. The periwound has tenderness on palpation. Assessment Harth, Zeya ([id number]) Active Problems ICD-10 E11.621 - Type 2 diabetes mellitus with foot ulcer L89.623 - Pressure ulcer of left heel, stage 3 L89.613 - Pressure ulcer of right heel, stage 3 Procedures Wound #1 Pre-procedure diagnosis of Wound #1 is a Pressure Ulcer located on the Right Calcaneus . There was a Skin/Subcutaneous Tissue Debridement ([id number]) debridement with total area of 3.64 sq cm performed by Maxwell Caul, MD. with the following instrument(s): Curette to remove Viable and Non-Viable tissue/material including Fibrin/Slough and Subcutaneous after achieving pain control using Lidocaine 4% Topical Solution. A time out was conducted at 14:41, prior to the start of the procedure. A Moderate amount of bleeding was controlled with Pressure. The procedure was tolerated well with a pain level of 0 throughout and a pain level of 0 following the procedure. Post Debridement Measurements: 1.4cm length x 2.6cm width x 0.2cm depth; 0.572cm^3 volume. Post debridement Stage noted as Category/Stage III. Character of Wound/Ulcer Post Debridement is improved. Post procedure Diagnosis Wound #1: Same as Pre-Procedure Wound #2 Pre-procedure diagnosis of Wound #2 is a Pressure Ulcer located on the Left Calcaneus . There was a Skin/Subcutaneous Tissue Debridement ([id number]) debridement with total area of 7.13 sq cm performed by Maxwell Caul, MD. with the following instrument(s): Curette to remove Viable and Non-Viable tissue/material including Fibrin/Slough and Subcutaneous after achieving pain control using Lidocaine 4% Topical Solution. A time out was conducted at 14:44, prior to the start of the procedure. A Moderate amount of bleeding was controlled with Pressure. The procedure was tolerated well with a pain level of 0 throughout and a pain level of 0  following the procedure. Post Debridement Measurements: 2.3cm length x 3.1cm width x 0.3cm depth; 1.68cm^3 volume. Post debridement Stage noted as Category/Stage III. Character of Wound/Ulcer Post Debridement is improved. Post procedure Diagnosis Wound #2: Same as Pre-Procedure Plan Wound Cleansing: Wound #1 Right Calcaneus: [name], [name] ([id number]) Clean wound with Normal Saline. May Shower, gently pat wound dry prior to applying new dressing. Wound #2 Left Calcaneus: Clean wound with Normal Saline. May Shower, gently pat wound dry prior to applying new dressing. Anesthetic: Wound #1 Right Calcaneus: Topical Lidocaine 4% cream applied to wound bed prior to debridement Wound #2 Left Calcaneus: Topical Lidocaine 4% cream applied to wound bed prior to debridement Primary Wound Dressing: Wound #1 Right Calcaneus: Hydrafera Blue Wound #2 Left Calcaneus: Hydrafera Blue Secondary Dressing: Wound #1 Right Calcaneus: Gauze and Kerlix/Conform Foam - heel cups Wound #2 Left Calcaneus: Gauze and Kerlix/Conform Foam - heel cups Dressing Change Frequency: Wound #1 Right Calcaneus: Change dressing every day. Wound #2 Left Calcaneus: Change dressing every day. Follow-up  Appointments: Wound #1 Right Calcaneus: Return Appointment in 1 week. Wound #2 Left Calcaneus: Return Appointment in 1 week. Edema Control: Wound #1 Right Calcaneus: Elevate legs to the level of the heart and pump ankles as often as possible Wound #2 Left Calcaneus: Elevate legs to the level of the heart and pump ankles as often as possible Off-Loading: Wound #1 Right Calcaneus: Turn and reposition every 2 hours Other: - wear heel protector boots at al times in bed, float heels while in bed, Wound #2 Left Calcaneus: Turn and reposition every 2 hours Other: - wear heel protector boots at al times in bed, float heels while in bed, Additional Orders / Instructions: Wound #1 Right Calcaneus: Increase protein  intake. Other: - please add vitamin A, vitamin C and zinc supplements to your diet Wound #2 Left Calcaneus: Increase protein intake. Other: - please add vitamin A, vitamin C and zinc supplements to your diet [name], [name] #1 I'm going to continue with Hydrofera Blue to both wound areas this week however if there is further deterioration I may consider backtracking to Santyl or Iodoflex Electronic Signature(s) Signed: 09/22/2016 4:44:14 PM By: Baltazar Najjar MD Entered By: Baltazar Najjar on 09/22/2016 15:48:13 [name], [name] ([id number]) -------------------------------------------------------------------------------- SuperBill Details Patient Name: [name] Date of Service: 09/22/2016 Medical Record Patient Account Number: [id number] Number: Treating RN: Curtis Sites [birth year] (80 y.o. Other Clinician: Date of Birth/Sex: Female) Treating ROBSON, [name] Primary Care Provider: Duncan Dull Provider/Extender: G Referring Provider: Denton Brick in Treatment: 5 Diagnosis Coding ICD-10 Codes Code Description E11.621 Type 2 diabetes mellitus with foot ulcer L89.623 Pressure ulcer of left heel, stage 3 L89.613 Pressure ulcer of right heel, stage 3 Facility Procedures CPT4 Code: [id number] Description: 11042 - DEB SUBQ TISSUE 20 SQ CM/< ICD-10 Description Diagnosis E11.621 Type 2 diabetes mellitus with foot ulcer L89.623 Pressure ulcer of left heel, stage 3 L89.613 Pressure ulcer of right heel, stage 3 Modifier: Quantity: 1 Physician Procedures CPT4 Code: [id number] Description: 11042 - WC PHYS SUBQ TISS 20 SQ CM ICD-10 Description Diagnosis E11.621 Type 2 diabetes mellitus with foot ulcer L89.623 Pressure ulcer of left heel, stage 3 L89.613 Pressure ulcer of right heel, stage 3 Modifier: Quantity: 1 Electronic Signature(s) Signed: 09/22/2016 4:44:14 PM By: Baltazar Najjar MD Entered By: Baltazar Najjar on 09/22/2016 15:48:35

## 2016-09-29 ENCOUNTER — Encounter: Payer: Medicare (Managed Care) | Attending: Internal Medicine | Admitting: Internal Medicine

## 2016-09-29 DIAGNOSIS — L89623 Pressure ulcer of left heel, stage 3: Secondary | ICD-10-CM | POA: Diagnosis not present

## 2016-09-29 DIAGNOSIS — I129 Hypertensive chronic kidney disease with stage 1 through stage 4 chronic kidney disease, or unspecified chronic kidney disease: Secondary | ICD-10-CM | POA: Insufficient documentation

## 2016-09-29 DIAGNOSIS — E1140 Type 2 diabetes mellitus with diabetic neuropathy, unspecified: Secondary | ICD-10-CM | POA: Diagnosis not present

## 2016-09-29 DIAGNOSIS — E11621 Type 2 diabetes mellitus with foot ulcer: Secondary | ICD-10-CM | POA: Diagnosis present

## 2016-09-29 DIAGNOSIS — N184 Chronic kidney disease, stage 4 (severe): Secondary | ICD-10-CM | POA: Diagnosis not present

## 2016-09-29 DIAGNOSIS — D649 Anemia, unspecified: Secondary | ICD-10-CM | POA: Diagnosis not present

## 2016-09-29 DIAGNOSIS — F0280 Dementia in other diseases classified elsewhere without behavioral disturbance: Secondary | ICD-10-CM | POA: Insufficient documentation

## 2016-09-29 DIAGNOSIS — E1122 Type 2 diabetes mellitus with diabetic chronic kidney disease: Secondary | ICD-10-CM | POA: Diagnosis not present

## 2016-09-29 DIAGNOSIS — M109 Gout, unspecified: Secondary | ICD-10-CM | POA: Diagnosis not present

## 2016-09-29 DIAGNOSIS — L89613 Pressure ulcer of right heel, stage 3: Secondary | ICD-10-CM | POA: Insufficient documentation

## 2016-09-29 DIAGNOSIS — G309 Alzheimer's disease, unspecified: Secondary | ICD-10-CM | POA: Diagnosis not present

## 2016-09-30 NOTE — Progress Notes (Signed)
[name], [name] ([id number]) Visit Report for 09/29/2016 Chief Complaint Document Details Patient Name: [name], [name] Date of Service: 09/29/2016 12:30 PM Medical Record Patient Account Number: [id number] Number: Treating RN: Curtis SitesDorthy, Joanna [birth year] (81 y.o. Other Clinician: Date of Birth/Sex: Female) Treating ROBSON, [name] Primary Care Provider: Duncan Dullullo, Teresa Provider/Extender: G Referring Provider: Denton Brickullo, Teresa Weeks in Treatment: 6 Information Obtained from: Patient Chief Complaint 08/18/16; patient is here for review of bilateral heel ulcers Electronic Signature(s) Signed: 09/29/2016 4:31:20 PM By: Baltazar Najjarobson, [name] MD Entered By: Baltazar Najjarobson, [name] on 09/29/2016 13:16:46 Trulock, Cassandra ([id number]) -------------------------------------------------------------------------------- Debridement Details Patient Name: [name] LoboHEELY, [name] Date of Service: 09/29/2016 12:30 PM Medical Record Patient Account Number: [id number] Number: Treating RN: Curtis Sitesorthy, Joanna [birth year] (81 y.o. Other Clinician: Date of Birth/Sex: Female) Treating ROBSON, [name] Primary Care Provider: Duncan Dullullo, Teresa Provider/Extender: G Referring Provider: Denton Brickullo, Teresa Weeks in Treatment: 6 Debridement Performed for Wound #1 Right Calcaneus Assessment: Performed By: Physician Maxwell CaulOBSON, [name] G, MD Debridement: Debridement Pre-procedure Verification/Time Out Yes - 12:57 Taken: Start Time: 12:57 Pain Control: Lidocaine 4% Topical Solution Level: Skin/Subcutaneous Tissue Total Area Debrided (L x 1.6 (cm) x 2.6 (cm) = 4.16 (cm) W): Tissue and other Viable, Non-Viable, Fibrin/Slough, Subcutaneous material debrided: Instrument: Curette Bleeding: Moderate Hemostasis Achieved: Pressure End Time: 13:00 Procedural Pain: 0 Post Procedural Pain: 0 Response to Treatment: Procedure was tolerated well Post Debridement Measurements of Total Wound Length: (cm) 1.6 Stage: Category/Stage III Width:  (cm) 2.6 Depth: (cm) 0.3 Volume: (cm) 0.98 Character of Wound/Ulcer Post Improved Debridement: Post Procedure Diagnosis Same as Pre-procedure Electronic Signature(s) Signed: 09/29/2016 4:31:20 PM By: Baltazar Najjarobson, [name] MD Signed: 09/29/2016 5:04:25 PM By: Curtis Sitesorthy, Joanna Entered By: Baltazar Najjarobson, [name] on 09/29/2016 13:16:38 Wandel, Elenna ([id number]) DixieHEELY, [name] ([id number]) -------------------------------------------------------------------------------- HPI Details Patient Name: [name] LoboHEELY, [name] Date of Service: 09/29/2016 12:30 PM Medical Record Patient Account Number: [id number] Number: Treating RN: Curtis Sitesorthy, Joanna [birth year] (81 y.o. Other Clinician: Date of Birth/Sex: Female) Treating ROBSON, [name] Primary Care Provider: Duncan Dullullo, Teresa Provider/Extender: G Referring Provider: Denton Brickullo, Teresa Weeks in Treatment: 6 History of Present Illness HPI Description: 08/18/16; this is a 81 year old woman who is a type II diabetic not currently on any treatment. She is also listed as having Alzheimer's disease hypertension. She has a history of chronic venous insufficiency with leg wounds in the past but no history of wounds in her feet. In terms of her diabetes at one point she was on insulin however she is no longer on any current treatment, her daughter who is providing most of the history is unaware of her hemoglobin A1c. She has stage IV chronic renal failure and follows with nephrology. The history provided by her daughter is that she has had a wound on the left heel perhaps dating back to last May/17. At that point she was in the hospital predominantly with psychiatric issues and was [facility]. This was treated with some form of foam dressing and may have actually healed however she was readmitted to hospital in January with Escherichia coli sepsis felt to be secondary to a UTI. Since then she is in [facility] skilled facility. Apparently this timeframe both the  left heel and right heel reopened or at least the daughter became aware that they were both open. The exact timeframe isn't really certain Her ABIs in this clinic were 1.08 on the right 1.25 on the left. Looking through [facility] doesn't really provide any useful information. She did have venous ultrasounds in 2016 that did not show a DVT. I don't  see a recent hemoglobin A1c 08/25/16; from last week we have been using Santyl to both heels. Apparently the nursing home didn't get an x-ray of both heels [[facility]] and the daughter states that there was "no injury to bone" but for some reason we haven't been able to get the official report from them. 09/01/16; continued improvement in both wounds on her bilateral heels using Santyl. X-ray report was negative for osteomyelitis 09/08/16; patient from [facility] skilled facility. Using Santyl on both her heel wounds which are fortunately not on the plantar surface. 09/15/16; patient is from [facility] skilled facility. She has been using Santyl on both her pressure areas which were stage III. Fortunately these or not on the plantar surface. 09/22/16; patient arrives today with a much less viable looking surface on her heels. We had been using Santyl with good improvement however unchanged either fair last week. This may be unrelated however she required debridement today. 09/29/16; patient arrives with a better looking surface on the left heel unfortunately this is a more substantial wound. The area on the right lateral calcaneus again covered in a nonviable necrotic surface. We have been making good progress with Santyl. I think I'm going to need to go back to a debriding agent Electronic Signature(s) Signed: 09/29/2016 4:31:20 PM By: Baltazar Najjar MD [place], [name] ([id number]) Entered By: Baltazar Najjar on 09/29/2016 13:19:43 Giammona, Collier  ([id number]) -------------------------------------------------------------------------------- Physical Exam Details Patient Name: [name] Date of Service: 09/29/2016 12:30 PM Medical Record Patient Account Number: [id number] Number: Treating RN: Curtis Sites 1936/04/19 (80 y.o. Other Clinician: Date of Birth/Sex: Female) Treating ROBSON, [name] Primary Care Provider: Duncan Dull Provider/Extender: G Referring Provider: Denton Brick in Treatment: 6 Constitutional . Sitting or standing Blood Pressure is within target range for patient.. Pulse regular and within target range for patient.Marland Kitchen Respirations regular, non-labored and within target range.. Temperature is normal and within the target range for the patient.Marland Kitchen appears in no distress. Cardiovascular Pedal pulses palpable and strong bilaterally.. Notes Wound exam; the patient's bilateral heel wounds, both stage III pressure ulcers. The more substantial wound on the left did not require debridement. The area on the right lateral calcaneus however debrided with a #5 curet to remove surface necrotic material. This cleaned up quite nicely. Electronic Signature(s) Signed: 09/29/2016 4:31:20 PM By: Baltazar Najjar MD Entered By: Baltazar Najjar on 09/29/2016 13:21:42 Marion, Rozalyn ([id number]) -------------------------------------------------------------------------------- Physician Orders Details Patient Name: [name] Date of Service: 09/29/2016 12:30 PM Medical Record Patient Account Number: [id number] Number: Treating RN: Curtis Sites 1935-08-10 (80 y.o. Other Clinician: Date of Birth/Sex: Female) Treating ROBSON, [name] Primary Care Provider: Duncan Dull Provider/Extender: G Referring Provider: Denton Brick in Treatment: 6 Verbal / Phone Orders: No Diagnosis Coding Wound Cleansing Wound #1 Right Calcaneus o Clean wound with Normal Saline. o May Shower, gently pat  wound dry prior to applying new dressing. Wound #2 Left Calcaneus o Clean wound with Normal Saline. o May Shower, gently pat wound dry prior to applying new dressing. Anesthetic Wound #1 Right Calcaneus o Topical Lidocaine 4% cream applied to wound bed prior to debridement Wound #2 Left Calcaneus o Topical Lidocaine 4% cream applied to wound bed prior to debridement Primary Wound Dressing Wound #1 Right Calcaneus o Iodoflex Wound #2 Left Calcaneus o Iodoflex Secondary Dressing Wound #1 Right Calcaneus o Gauze and Kerlix/Conform o Foam - heel cups Wound #2 Left Calcaneus o Gauze and Kerlix/Conform o Foam - heel cups Dressing Change Frequency Wound #1 Right  Calcaneus Diosdado, Kenesha ([id number]) o Change dressing every day. Wound #2 Left Calcaneus o Change dressing every day. Follow-up Appointments Wound #1 Right Calcaneus o Return Appointment in 1 week. Wound #2 Left Calcaneus o Return Appointment in 1 week. Edema Control Wound #1 Right Calcaneus o Elevate legs to the level of the heart and pump ankles as often as possible Wound #2 Left Calcaneus o Elevate legs to the level of the heart and pump ankles as often as possible Off-Loading Wound #1 Right Calcaneus o Turn and reposition every 2 hours o Other: - wear heel protector boots at al times in bed, float heels while in bed, Wound #2 Left Calcaneus o Turn and reposition every 2 hours o Other: - wear heel protector boots at al times in bed, float heels while in bed, Additional Orders / Instructions Wound #1 Right Calcaneus o Increase protein intake. o Other: - please add vitamin A, vitamin C and zinc supplements to your diet Wound #2 Left Calcaneus o Increase protein intake. o Other: - please add vitamin A, vitamin C and zinc supplements to your diet Electronic Signature(s) Signed: 09/29/2016 4:31:20 PM By: Baltazar Najjar MD Signed: 09/29/2016 5:04:25 PM By: Curtis Sites Entered By: Curtis Sites on 09/29/2016 13:00:54 Bry, Winona ([id number]) -------------------------------------------------------------------------------- Problem List Details Patient Name: [name] Date of Service: 09/29/2016 12:30 PM Medical Record Patient Account Number: [id number] Number: Treating RN: Curtis Sites May 01, 1935 (80 y.o. Other Clinician: Date of Birth/Sex: Female) Treating ROBSON, [name] Primary Care Provider: Duncan Dull Provider/Extender: G Referring Provider: Denton Brick in Treatment: 6 Active Problems ICD-10 Encounter Code Description Active Date Diagnosis E11.621 Type 2 diabetes mellitus with foot ulcer 08/18/2016 Yes L89.623 Pressure ulcer of left heel, stage 3 08/18/2016 Yes L89.613 Pressure ulcer of right heel, stage 3 08/18/2016 Yes Inactive Problems Resolved Problems Electronic Signature(s) Signed: 09/29/2016 4:31:20 PM By: Baltazar Najjar MD Entered By: Baltazar Najjar on 09/29/2016 13:16:06 Buehring, Keenan ([id number]) -------------------------------------------------------------------------------- Progress Note Details Patient Name: [name] Date of Service: 09/29/2016 12:30 PM Medical Record Patient Account Number: [id number] Number: Treating RN: Curtis Sites 08-14-1935 (80 y.o. Other Clinician: Date of Birth/Sex: Female) Treating ROBSON, [name] Primary Care Provider: Duncan Dull Provider/Extender: G Referring Provider: Denton Brick in Treatment: 6 Subjective Chief Complaint Information obtained from Patient 08/18/16; patient is here for review of bilateral heel ulcers History of Present Illness (HPI) 08/18/16; this is a 81 year old woman who is a type II diabetic not currently on any treatment. She is also listed as having Alzheimer's disease hypertension. She has a history of chronic venous insufficiency with leg wounds in the past but no history of wounds in her feet. In terms  of her diabetes at one point she was on insulin however she is no longer on any current treatment, her daughter who is providing most of the history is unaware of her hemoglobin A1c. She has stage IV chronic renal failure and follows with nephrology. The history provided by her daughter is that she has had a wound on the left heel perhaps dating back to last May/17. At that point she was in the hospital predominantly with psychiatric issues and was discharged [facility]. This was treated with some form of foam dressing and may have actually healed however she was readmitted to hospital in January with Escherichia coli sepsis felt to be secondary to a UTI. Since then she is in [facility] skilled facility. Apparently this timeframe both the left heel and right heel reopened or at  least the daughter became aware that they were both open. The exact timeframe isn't really certain Her ABIs in this clinic were 1.08 on the right 1.25 on the left. Looking through [facility] doesn't really provide any useful information. She did have venous ultrasounds in 2016 that did not show a DVT. I don't see a recent hemoglobin A1c 08/25/16; from last week we have been using Santyl to both heels. Apparently the nursing home didn't get an x-ray of both heels [[facility]] and the daughter states that there was "no injury to bone" but for some reason we haven't been able to get the official report from them. 09/01/16; continued improvement in both wounds on her bilateral heels using Santyl. X-ray report was negative for osteomyelitis 09/08/16; patient from [facility] skilled facility. Using Santyl on both her heel wounds which are fortunately not on the plantar surface. 09/15/16; patient is from [facility] skilled facility. She has been using Santyl on both her pressure areas which were stage III. Fortunately these or not on the plantar surface. 09/22/16; patient arrives today with a much less  viable looking surface on her heels. We had been using Santyl with good improvement however unchanged either fair last week. This may be unrelated however she required debridement today. 09/29/16; patient arrives with a better looking surface on the left heel unfortunately this is a more substantial wound. The area on the right lateral calcaneus again covered in a nonviable necrotic surface. We have Thompson, Sueanne ([id number]) been making good progress with Santyl. I think I'm going to need to go back to a debriding agent Objective Constitutional Sitting or standing Blood Pressure is within target range for patient.. Pulse regular and within target range for patient.Marland Kitchen Respirations regular, non-labored and within target range.. Temperature is normal and within the target range for the patient.Marland Kitchen appears in no distress. Vitals Time Taken: 12:38 PM, Height: 66 in, Weight: 140.8 lbs, BMI: 22.7, Temperature: 97.8 F, Pulse: 63 bpm, Respiratory Rate: 18 breaths/min, Blood Pressure: 118/51 mmHg. Cardiovascular Pedal pulses palpable and strong bilaterally.. General Notes: Wound exam; the patient's bilateral heel wounds, both stage III pressure ulcers. The more substantial wound on the left did not require debridement. The area on the right lateral calcaneus however debrided with a #5 curet to remove surface necrotic material. This cleaned up quite nicely. Integumentary (Hair, Skin) Wound #1 status is Open. Original cause of wound was Pressure Injury. The wound is located on the Right Calcaneus. The wound measures 1.6cm length x 2.6cm width x 0.2cm depth; 3.267cm^2 area and 0.653cm^3 volume. The wound is limited to skin breakdown. There is no tunneling or undermining noted. There is a large amount of serous drainage noted. The wound margin is flat and intact. There is large (67- 100%) pink granulation within the wound bed. There is a small (1-33%) amount of necrotic tissue within the wound bed including  Eschar and Adherent Slough. The periwound skin appearance did not exhibit: Callus, Crepitus, Excoriation, Induration, Rash, Scarring, Dry/Scaly, Maceration, Atrophie Blanche, Cyanosis, Ecchymosis, Hemosiderin Staining, Mottled, Pallor, Rubor, Erythema. Periwound temperature was noted as No Abnormality. The periwound has tenderness on palpation. Wound #2 status is Open. Original cause of wound was Pressure Injury. The wound is located on the Left Calcaneus. The wound measures 2.3cm length x 3.4cm width x 0.1cm depth; 6.142cm^2 area and 0.614cm^3 volume. The wound is limited to skin breakdown. There is no tunneling or undermining noted. There is a large amount of serous drainage noted. The wound margin  is flat and intact. There is large (67- 100%) pink granulation within the wound bed. There is a small (1-33%) amount of necrotic tissue within the wound bed including Eschar and Adherent Slough. The periwound skin appearance did not exhibit: Callus, Crepitus, Excoriation, Induration, Rash, Scarring, Dry/Scaly, Maceration, Atrophie Blanche, Cyanosis, Ecchymosis, Hemosiderin Staining, Mottled, Pallor, Rubor, Erythema. Periwound temperature was noted as No Abnormality. The periwound has tenderness on palpation. Nicoll, Jonaya ([id number]) Assessment Active Problems ICD-10 E11.621 - Type 2 diabetes mellitus with foot ulcer L89.623 - Pressure ulcer of left heel, stage 3 L89.613 - Pressure ulcer of right heel, stage 3 Procedures Wound #1 Pre-procedure diagnosis of Wound #1 is a Pressure Ulcer located on the Right Calcaneus . There was a Skin/Subcutaneous Tissue Debridement ([id number]) debridement with total area of 4.16 sq cm performed by Maxwell Caul, MD. with the following instrument(s): Curette to remove Viable and Non-Viable tissue/material including Fibrin/Slough and Subcutaneous after achieving pain control using Lidocaine 4% Topical Solution. A time out was conducted at 12:57, prior to  the start of the procedure. A Moderate amount of bleeding was controlled with Pressure. The procedure was tolerated well with a pain level of 0 throughout and a pain level of 0 following the procedure. Post Debridement Measurements: 1.6cm length x 2.6cm width x 0.3cm depth; 0.98cm^3 volume. Post debridement Stage noted as Category/Stage III. Character of Wound/Ulcer Post Debridement is improved. Post procedure Diagnosis Wound #1: Same as Pre-Procedure Plan Wound Cleansing: Wound #1 Right Calcaneus: Clean wound with Normal Saline. May Shower, gently pat wound dry prior to applying new dressing. Wound #2 Left Calcaneus: Clean wound with Normal Saline. May Shower, gently pat wound dry prior to applying new dressing. Anesthetic: Wound #1 Right Calcaneus: Topical Lidocaine 4% cream applied to wound bed prior to debridement Wound #2 Left Calcaneus: Topical Lidocaine 4% cream applied to wound bed prior to debridement Primary Wound Dressing: Wound #1 Right Calcaneus: Dorgan, Lynn ([id number]) Iodoflex Wound #2 Left Calcaneus: Iodoflex Secondary Dressing: Wound #1 Right Calcaneus: Gauze and Kerlix/Conform Foam - heel cups Wound #2 Left Calcaneus: Gauze and Kerlix/Conform Foam - heel cups Dressing Change Frequency: Wound #1 Right Calcaneus: Change dressing every day. Wound #2 Left Calcaneus: Change dressing every day. Follow-up Appointments: Wound #1 Right Calcaneus: Return Appointment in 1 week. Wound #2 Left Calcaneus: Return Appointment in 1 week. Edema Control: Wound #1 Right Calcaneus: Elevate legs to the level of the heart and pump ankles as often as possible Wound #2 Left Calcaneus: Elevate legs to the level of the heart and pump ankles as often as possible Off-Loading: Wound #1 Right Calcaneus: Turn and reposition every 2 hours Other: - wear heel protector boots at al times in bed, float heels while in bed, Wound #2 Left Calcaneus: Turn and reposition every 2  hours Other: - wear heel protector boots at al times in bed, float heels while in bed, Additional Orders / Instructions: Wound #1 Right Calcaneus: Increase protein intake. Other: - please add vitamin A, vitamin C and zinc supplements to your diet Wound #2 Left Calcaneus: Increase protein intake. Other: - please add vitamin A, vitamin C and zinc supplements to your diet #1 change the primary dressing to Iodoflex changed daily due to a large amount of drainage. #2 no evidence of surrounding infection nothing needed culturing. #3 whether the patient is able to protect these wound areas during her day in the facility is not totally clear Hum, Aino ([id number]) Electronic Signature(s) Signed: 09/29/2016 4:31:20 PM By: Baltazar Najjar MD  Entered By: Baltazar Najjar on 09/29/2016 13:22:41 Millspaugh, Tephanie ([id number]) -------------------------------------------------------------------------------- [place] Details Patient Name: [name] Date of Service: 09/29/2016 Medical Record Patient Account Number: [id number] Number: Treating RN: Curtis Sites [birth year] (80 y.o. Other Clinician: Date of Birth/Sex: Female) Treating ROBSON, [name] Primary Care Provider: Duncan Dull Provider/Extender: G Referring Provider: Denton Brick in Treatment: 6 Diagnosis Coding ICD-10 Codes Code Description E11.621 Type 2 diabetes mellitus with foot ulcer L89.623 Pressure ulcer of left heel, stage 3 L89.613 Pressure ulcer of right heel, stage 3 Facility Procedures CPT4 Code: [id number] Description: 11042 - DEB SUBQ TISSUE 20 SQ CM/< ICD-10 Description Diagnosis E11.621 Type 2 diabetes mellitus with foot ulcer L89.623 Pressure ulcer of left heel, stage 3 L89.613 Pressure ulcer of right heel, stage 3 Modifier: Quantity: 1 Physician Procedures CPT4 Code: [id number] Description: 11042 - WC PHYS SUBQ TISS 20 SQ CM ICD-10 Description Diagnosis E11.621 Type 2 diabetes mellitus with foot  ulcer L89.623 Pressure ulcer of left heel, stage 3 L89.613 Pressure ulcer of right heel, stage 3 Modifier: Quantity: 1 Electronic Signature(s) Signed: 09/29/2016 4:31:20 PM By: Baltazar Najjar MD Entered By: Baltazar Najjar on 09/29/2016 13:23:56

## 2016-09-30 NOTE — Progress Notes (Signed)
[name], [name] Visit Report for 09/29/2016 Arrival Information Details Patient Name: [name], [name] Date of Service: 09/29/2016 12:30 PM Medical Record Patient Account Number: [id number] Number: Treating RN: Curtis Sites December 12, 1935 (81 y.o. Other Clinician: Date of Birth/Sex: Female) Treating ROBSON, [name] Primary Care Henry Demeritt: Duncan Dull Kentrell Guettler/Extender: G Referring Neal Oshea: Denton Brick in Treatment: 6 Visit Information History Since Last Visit Added or deleted any medications: No Patient Arrived: Walker Any new allergies or adverse reactions: No Arrival Time: 12:38 Had a fall or experienced change in No Accompanied By: dtr activities of daily living that may affect Transfer Assistance: None risk of falls: Patient Identification Verified: Yes Signs or symptoms of abuse/neglect since last No Secondary Verification Process Completed: Yes visito Patient Requires Transmission-Based No Hospitalized since last visit: No Precautions: Has Dressing in Place as Prescribed: Yes Patient Has Alerts: Yes Pain Present Now: No Patient Alerts: DM II Electronic Signature(s) Signed: 09/29/2016 5:04:25 PM By: Curtis Sites Entered By: Curtis Sites on 09/29/2016 12:38:47 [name], [name] -------------------------------------------------------------------------------- Encounter Discharge Information Details Patient Name: [name] Date of Service: 09/29/2016 12:30 PM Medical Record Patient Account Number: [id number] Number: Treating RN: Curtis Sites [birth year] (81 y.o. Other Clinician: Date of Birth/Sex: Female) Treating ROBSON, [name] Primary Care Cyle Kenyon: Duncan Dull Kristle Wesch/Extender: G Referring Matteson Blue: Denton Brick in Treatment: 6 Encounter Discharge Information Items Discharge Pain Level: 0 Discharge Condition: Stable Ambulatory Status: Walker Nursing Discharge  Destination: Home Private Transportation: Auto Accompanied By: dtr Schedule Follow-up Appointment: Yes Medication Reconciliation completed and Yes provided to Patient/Care Jaki Hammerschmidt: Clinical Summary of Care: Provided Form Type Recipient Paper Patient mc Electronic Signature(s) Signed: 09/29/2016 1:18:55 PM By: Dayton Martes RCP, RRT, CHT Entered By: Dayton Martes on 09/29/2016 13:18:55 Redbird [name], [name] ([id number]) -------------------------------------------------------------------------------- Lower Extremity Assessment Details Patient Name: [name] Date of Service: 09/29/2016 12:30 PM Medical Record Patient Account Number: [id number] Number: Treating RN: Curtis Sites 1935-10-01 (81 y.o. Other Clinician: Date of Birth/Sex: Female) Treating ROBSON, [name] Primary Care Irie Fiorello: Duncan Dull Lalania Haseman/Extender: G Referring Mena Simonis: Denton Brick in Treatment: 6 Vascular Assessment Pulses: Dorsalis Pedis Palpable: [Left:Yes] [Right:Yes] Posterior Tibial Extremity colors, hair growth, and conditions: Extremity Color: [Left:Hyperpigmented] [Right:Hyperpigmented] Hair Growth on Extremity: [Left:No] [Right:No] Temperature of Extremity: [Left:Warm] [Right:Warm] Capillary Refill: [Left:< 3 seconds] [Right:< 3 seconds] Electronic Signature(s) Signed: 09/29/2016 5:04:25 PM By: Curtis Sites Entered By: Curtis Sites on 09/29/2016 12:56:48 [name], [name] ([id number]) -------------------------------------------------------------------------------- Multi Wound Chart Details Patient Name: [name] Date of Service: 09/29/2016 12:30 PM Medical Record Patient Account Number: [id number] Number: Treating RN: Curtis Sites Aug 29, 1935 (81 y.o. Other Clinician: Date of Birth/Sex: Female) Treating ROBSON, [name] Primary Care Glendal Cassaday: Duncan Dull Jaasia Viglione/Extender: G Referring Shalece Staffa: Denton Brick in  Treatment: 6 Vital Signs Height(in): 66 Pulse(bpm): 63 Weight(lbs): 140.8 Blood Pressure 118/51 (mmHg): Body Mass Index(BMI): 23 Temperature(F): 97.8 Respiratory Rate 18 (breaths/min): Photos: [N/A:N/A] Wound Location: Right Calcaneus Left Calcaneus N/A Wounding Event: Pressure Injury Pressure Injury N/A Primary Etiology: Pressure Ulcer Pressure Ulcer N/A Comorbid History: Cataracts, Anemia, Cataracts, Anemia, N/A Congestive Heart Failure, Congestive Heart Failure, Hypertension, Type II Hypertension, Type II Diabetes, Gout, Dementia, Diabetes, Gout, Dementia, Neuropathy Neuropathy Date Acquired: 07/05/2016 07/05/2016 N/A Weeks of Treatment: 6 6 N/A Wound Status: Open Open N/A Measurements L x W x D 1.6x2.6x0.2 2.3x3.4x0.1 N/A (cm) Area (cm) : 3.267 6.142 N/A Volume (cm) : 0.653 0.614 N/A % Reduction in Area: 53.80% 44.10% N/A % Reduction in Volume: 53.80% 72.10% N/A Classification: Category/Stage III Category/Stage III N/A HBO Classification:  Grade 1 Grade 1 N/A Exudate Amount: Large Large N/A Exudate Type: Serous Serous N/A [name], [name] ([id number]) Exudate Color: amber amber N/A Foul Odor After Yes Yes N/A Cleansing: Odor Anticipated Due to No No N/A Product Use: Wound Margin: Flat and Intact Flat and Intact N/A Granulation Amount: Large (67-100%) Large (67-100%) N/A Granulation Quality: Pink Pink N/A Necrotic Amount: Small (1-33%) Small (1-33%) N/A Necrotic Tissue: Eschar, Adherent Slough Eschar, Adherent Slough N/A Exposed Structures: Fascia: No Fascia: No N/A Fat Layer (Subcutaneous Fat Layer (Subcutaneous Tissue) Exposed: No Tissue) Exposed: No Tendon: No Tendon: No Muscle: No Muscle: No Joint: No Joint: No Bone: No Bone: No Limited to Skin Limited to Skin Breakdown Breakdown Epithelialization: None None N/A Debridement: Debridement (40981- N/A N/A 11047) Pre-procedure 12:57 N/A N/A Verification/Time Out Taken: Pain Control: Lidocaine 4%  Topical N/A N/A Solution Tissue Debrided: Fibrin/Slough, N/A N/A Subcutaneous Level: Skin/Subcutaneous N/A N/A Tissue Debridement Area (sq 4.16 N/A N/A cm): Instrument: Curette N/A N/A Bleeding: Moderate N/A N/A Hemostasis Achieved: Pressure N/A N/A Procedural Pain: 0 N/A N/A Post Procedural Pain: 0 N/A N/A Debridement Treatment Procedure was tolerated N/A N/A Response: well Post Debridement 1.6x2.6x0.3 N/A N/A Measurements L x W x D (cm) Post Debridement 0.98 N/A N/A Volume: (cm) Post Debridement Category/Stage III N/A N/A Stage: Periwound Skin Texture: Excoriation: No Excoriation: No N/A Induration: No Induration: No Callus: No Callus: No Pile, Audrie ([id number]) Crepitus: No Crepitus: No Rash: No Rash: No Scarring: No Scarring: No Periwound Skin Maceration: No Maceration: No N/A Moisture: Dry/Scaly: No Dry/Scaly: No Periwound Skin Color: Atrophie Blanche: No Atrophie Blanche: No N/A Cyanosis: No Cyanosis: No Ecchymosis: No Ecchymosis: No Erythema: No Erythema: No Hemosiderin Staining: No Hemosiderin Staining: No Mottled: No Mottled: No Pallor: No Pallor: No Rubor: No Rubor: No Temperature: No Abnormality No Abnormality N/A Tenderness on Yes Yes N/A Palpation: Wound Preparation: Ulcer Cleansing: Ulcer Cleansing: N/A Rinsed/Irrigated with Rinsed/Irrigated with Saline Saline Topical Anesthetic Topical Anesthetic Applied: Other: lidocaine Applied: Other: lidocaine 4% 4% Procedures Performed: Debridement N/A N/A Treatment Notes Electronic Signature(s) Signed: 09/29/2016 4:31:20 PM By: Baltazar Najjar MD Entered By: Baltazar Najjar on 09/29/2016 13:16:14 [name], [name] ([id number]) -------------------------------------------------------------------------------- Multi-Disciplinary Care Plan Details Patient Name: [name] Date of Service: 09/29/2016 12:30 PM Medical Record Patient Account Number: [id number] Number: Treating RN:  Curtis Sites 1935-06-13 (80 y.o. Other Clinician: Date of Birth/Sex: Female) Treating ROBSON, [name] Primary Care Lakiesha Ralphs: Duncan Dull Nalee Lightle/Extender: G Referring Christan Ciccarelli: Denton Brick in Treatment: 6 Active Inactive ` Abuse / Safety / Falls / Self Care Management Nursing Diagnoses: Impaired physical mobility Potential for falls Goals: Patient will remain injury free Date Initiated: 08/18/2016 Target Resolution Date: 10/29/2016 Goal Status: Active Interventions: Assess fall risk on admission and as needed Notes: ` Nutrition Nursing Diagnoses: Potential for alteratiion in Nutrition/Potential for imbalanced nutrition Goals: Patient/caregiver agrees to and verbalizes understanding of need to use nutritional supplements and/or vitamins as prescribed Date Initiated: 08/18/2016 Target Resolution Date: 10/29/2016 Goal Status: Active Interventions: Assess patient nutrition upon admission and as needed per policy Notes: ` Orientation to the Wound Care Program [name], [name] ([id number]) Nursing Diagnoses: Knowledge deficit related to the wound healing center program Goals: Patient/caregiver will verbalize understanding of the Wound Healing Center Program Date Initiated: 08/18/2016 Target Resolution Date: 10/29/2016 Goal Status: Active Interventions: Provide education on orientation to the wound center Notes: ` Wound/Skin Impairment Nursing Diagnoses: Impaired tissue integrity Goals: Patient/caregiver will verbalize understanding of skin care regimen Date Initiated: 08/18/2016 Target Resolution Date: 10/29/2016 Goal Status: Active  Ulcer/skin breakdown will have a volume reduction of 30% by week 4 Date Initiated: 08/18/2016 Target Resolution Date: 10/29/2016 Goal Status: Active Ulcer/skin breakdown will have a volume reduction of 50% by week 8 Date Initiated: 08/18/2016 Target Resolution Date: 10/29/2016 Goal Status: Active Ulcer/skin breakdown will have a volume  reduction of 80% by week 12 Date Initiated: 08/18/2016 Target Resolution Date: 10/29/2016 Goal Status: Active Ulcer/skin breakdown will heal within 14 weeks Date Initiated: 08/18/2016 Target Resolution Date: 10/29/2016 Goal Status: Active Interventions: Assess patient/caregiver ability to obtain necessary supplies Assess patient/caregiver ability to perform ulcer/skin care regimen upon admission and as needed Assess ulceration(s) every visit Notes: Electronic Signature(s) Signed: 09/29/2016 5:04:25 PM By: Charlotta Newton, Maile ([id number]) Entered By: Curtis Sites on 09/29/2016 12:56:52 [name], [name] -------------------------------------------------------------------------------- Pain Assessment Details Patient Name: [name] Date of Service: 09/29/2016 12:30 PM Medical Record Patient Account Number: [id number] Number: Treating RN: Curtis Sites [birth year] (80 y.o. Other Clinician: Date of Birth/Sex: Female) Treating ROBSON, [name] Primary Care Joaopedro Eschbach: Duncan Dull Keyaria Lawson/Extender: G Referring Autrey Human: Denton Brick in Treatment: 6 Active Problems Location of Pain Severity and Description of Pain Patient Has Paino No Site Locations Pain Management and Medication Current Pain Management: Notes Topical or injectable lidocaine is offered to patient for acute pain when surgical debridement is performed. If needed, Patient is instructed to use over the counter pain medication for the following 24-48 hours after debridement. Wound care MDs do not prescribed pain medications. Patient has chronic pain or uncontrolled pain. Patient has been instructed to make an appointment with their Primary Care Physician for pain management. Electronic Signature(s) Signed: 09/29/2016 5:04:25 PM By: Curtis Sites Entered By: Curtis Sites on 09/29/2016 12:38:54 [name], [name]  ([id number]) -------------------------------------------------------------------------------- Patient/Caregiver Education Details Patient Name: [name] Date of Service: 09/29/2016 12:30 PM Medical Record Patient Account Number: [id number] Number: Treating RN: Curtis Sites February 27, 1936 (80 y.o. Other Clinician: Date of Birth/Gender: Female) Treating ROBSON, [name] Primary Care Physician/Extender: Virgil Benedict Physician: Tania Ade in Treatment: 6 Referring Physician: Duncan Dull Education Assessment Education Provided To: Caregiver SNF nurses via written orders Education Topics Provided Wound/Skin Impairment: Handouts: Other: wound care as ordered Methods: Clinical cytogeneticist) Signed: 09/29/2016 5:04:25 PM By: Curtis Sites Entered By: Curtis Sites on 09/29/2016 12:58:38 Falter, Journiee ([id number]) -------------------------------------------------------------------------------- Wound Assessment Details Patient Name: [name] Date of Service: 09/29/2016 12:30 PM Medical Record Patient Account Number: [id number] Number: Treating RN: Curtis Sites 03/08/1936 (80 y.o. Other Clinician: Date of Birth/Sex: Female) Treating ROBSON, [name] Primary Care Glessie Eustice: Duncan Dull Ludie Pavlik/Extender: G Referring Yaneisy Wenz: Denton Brick in Treatment: 6 Wound Status Wound Number: 1 Primary Pressure Ulcer Etiology: Wound Location: Right Calcaneus Wound Open Wounding Event: Pressure Injury Status: Date Acquired: 07/05/2016 Comorbid Cataracts, Anemia, Congestive Heart Weeks Of Treatment: 6 History: Failure, Hypertension, Type II Diabetes, Clustered Wound: No Gout, Dementia, Neuropathy Photos Photo Uploaded By: Curtis Sites on 09/29/2016 12:52:50 Wound Measurements Length: (cm) 1.6 Width: (cm) 2.6 Depth: (cm) 0.2 Area: (cm) 3.267 Volume: (cm) 0.653 % Reduction in Area: 53.8% % Reduction in Volume: 53.8% Epithelialization:  None Tunneling: No Undermining: No Wound Description Classification: Category/Stage III Foul Odor Afte Diabetic Severity (Wagner): Grade 1 Due to Product Wound Margin: Flat and Intact Slough/Fibrino Exudate Amount: Large Exudate Type: Serous Exudate Color: amber r Cleansing: Yes Use: No No Wound Bed Granulation Amount: Large (67-100%) Exposed Structure [name], [name] ([id number]) Granulation Quality: Pink Fascia Exposed: No Necrotic Amount: Small (1-33%) Fat Layer (Subcutaneous Tissue) Exposed: No Necrotic Quality: Eschar, Adherent Slough  Tendon Exposed: No Muscle Exposed: No Joint Exposed: No Bone Exposed: No Limited to Skin Breakdown Periwound Skin Texture Texture Color No Abnormalities Noted: No No Abnormalities Noted: No Callus: No Atrophie Blanche: No Crepitus: No Cyanosis: No Excoriation: No Ecchymosis: No Induration: No Erythema: No Rash: No Hemosiderin Staining: No Scarring: No Mottled: No Pallor: No Moisture Rubor: No No Abnormalities Noted: No Dry / Scaly: No Temperature / Pain Maceration: No Temperature: No Abnormality Tenderness on Palpation: Yes Wound Preparation Ulcer Cleansing: Rinsed/Irrigated with Saline Topical Anesthetic Applied: Other: lidocaine 4%, Treatment Notes Wound #1 (Right Calcaneus) 1. Cleansed with: Clean wound with Normal Saline 2. Anesthetic Topical Lidocaine 4% cream to wound bed prior to debridement 4. Dressing Applied: Iodoflex 5. Secondary Dressing Applied Dry Gauze Foam Kerlix/Conform 7. Secured with Tape Notes heel cup Electronic Signature(s) Signed: 09/29/2016 5:04:25 PM By: Charlotta Newtonorthy, Joanna Kondo, [name] ([id number]) Entered By: Curtis Sitesorthy, Joanna on 09/29/2016 12:46:29 Gruwell, Azelyn ([id number]) -------------------------------------------------------------------------------- Wound Assessment Details Patient Name: [name] LoboHEELY, [name] Date of Service: 09/29/2016 12:30 PM Medical Record Patient Account Number:  [id number] Number: Treating RN: Curtis SitesDorthy, Joanna 04/20/1936 (80 y.o. Other Clinician: Date of Birth/Sex: Female) Treating ROBSON, [name] Primary Care Lya Holben: Duncan Dullullo, Teresa Aubriel Khanna/Extender: G Referring Halston Kintz: Denton Brickullo, Teresa Weeks in Treatment: 6 Wound Status Wound Number: 2 Primary Pressure Ulcer Etiology: Wound Location: Left Calcaneus Wound Open Wounding Event: Pressure Injury Status: Date Acquired: 07/05/2016 Comorbid Cataracts, Anemia, Congestive Heart Weeks Of Treatment: 6 History: Failure, Hypertension, Type II Diabetes, Clustered Wound: No Gout, Dementia, Neuropathy Photos Photo Uploaded By: Curtis Sitesorthy, Joanna on 09/29/2016 12:52:51 Wound Measurements Length: (cm) 2.3 Width: (cm) 3.4 Depth: (cm) 0.1 Area: (cm) 6.142 Volume: (cm) 0.614 % Reduction in Area: 44.1% % Reduction in Volume: 72.1% Epithelialization: None Tunneling: No Undermining: No Wound Description Classification: Category/Stage III Foul Odor Afte Diabetic Severity (Wagner): Grade 1 Due to Product Wound Margin: Flat and Intact Slough/Fibrino Exudate Amount: Large Exudate Type: Serous Exudate Color: amber r Cleansing: Yes Use: No No Wound Bed Granulation Amount: Large (67-100%) Exposed Structure [name], [name] ([id number]) Granulation Quality: Pink Fascia Exposed: No Necrotic Amount: Small (1-33%) Fat Layer (Subcutaneous Tissue) Exposed: No Necrotic Quality: Eschar, Adherent Slough Tendon Exposed: No Muscle Exposed: No Joint Exposed: No Bone Exposed: No Limited to Skin Breakdown Periwound Skin Texture Texture Color No Abnormalities Noted: No No Abnormalities Noted: No Callus: No Atrophie Blanche: No Crepitus: No Cyanosis: No Excoriation: No Ecchymosis: No Induration: No Erythema: No Rash: No Hemosiderin Staining: No Scarring: No Mottled: No Pallor: No Moisture Rubor: No No Abnormalities Noted: No Dry / Scaly: No Temperature / Pain Maceration:  No Temperature: No Abnormality Tenderness on Palpation: Yes Wound Preparation Ulcer Cleansing: Rinsed/Irrigated with Saline Topical Anesthetic Applied: Other: lidocaine 4%, Treatment Notes Wound #2 (Left Calcaneus) 1. Cleansed with: Clean wound with Normal Saline 2. Anesthetic Topical Lidocaine 4% cream to wound bed prior to debridement 4. Dressing Applied: Iodoflex 5. Secondary Dressing Applied Dry Gauze Foam Kerlix/Conform 7. Secured with Tape Notes heel cup Electronic Signature(s) Signed: 09/29/2016 5:04:25 PM By: Charlotta Newtonorthy, Joanna [name], [name] ([id number]) Entered By: Curtis Sitesorthy, Joanna on 09/29/2016 12:46:41 Bergin, Keyarra ([id number]) -------------------------------------------------------------------------------- Vitals Details Patient Name: [name] LoboHEELY, Mahika Date of Service: 09/29/2016 12:30 PM Medical Record Patient Account Number: [id number] Number: Treating RN: Curtis SitesDorthy, Joanna 04/20/1936 (80 y.o. Other Clinician: Date of Birth/Sex: Female) Treating ROBSON, [name] Primary Care Albert Hersch: Duncan Dullullo, Teresa Kerina Simoneau/Extender: G Referring Odeth Bry: Denton Brickullo, Teresa Weeks in Treatment: 6 Vital Signs Time Taken: 12:38 Temperature (F): 97.8 Height (in): 66 Pulse (bpm): 63 Weight (lbs): 140.8 Respiratory  Rate (breaths/min): 18 Body Mass Index (BMI): 22.7 Blood Pressure (mmHg): 118/51 Reference Range: 80 - 120 mg / dl Electronic Signature(s) Signed: 09/29/2016 5:04:25 PM By: Curtis Sites Entered By: Curtis Sites on 09/29/2016 12:42:55

## 2016-10-06 ENCOUNTER — Encounter: Payer: Medicare (Managed Care) | Admitting: Internal Medicine

## 2016-10-06 DIAGNOSIS — E11621 Type 2 diabetes mellitus with foot ulcer: Secondary | ICD-10-CM | POA: Diagnosis not present

## 2016-10-09 NOTE — Progress Notes (Signed)
[name], [name] Visit Report for 10/06/2016 Chief Complaint Document Details Patient Name: [name], [name] Date of Service: 10/06/2016 1:30 PM Medical Record Patient Account Number: [id number] Number: Treating RN: Curtis Sites 1936/02/08 (80 y.o. Other Clinician: Date of Birth/Sex: Female) Treating Janis Cuffe Primary Care Provider: Duncan Dull Provider/Extender: G Referring Provider: Denton Brick in Treatment: 7 Information Obtained from: Patient Chief Complaint 08/18/16; patient is here for review of bilateral heel ulcers Electronic Signature(s) Signed: 10/06/2016 5:26:18 PM By: Baltazar Najjar MD Entered By: Baltazar Najjar on 10/06/2016 14:19:07 Tesoriero, Khya ([id number]) -------------------------------------------------------------------------------- Debridement Details Patient Name: [name] Date of Service: 10/06/2016 1:30 PM Medical Record Patient Account Number: [id number] Number: Treating RN: Curtis Sites [birth year] (80 y.o. Other Clinician: Date of Birth/Sex: Female) Treating Darah Simkin Primary Care Provider: Duncan Dull Provider/Extender: G Referring Provider: Denton Brick in Treatment: 7 Debridement Performed for Wound #1 Right Calcaneus Assessment: Performed By: Physician Maxwell Caul, MD Debridement: Debridement Pre-procedure Verification/Time Out Yes - 13:57 Taken: Start Time: 13:57 Pain Control: Lidocaine 4% Topical Solution Level: Skin/Subcutaneous Tissue Total Area Debrided (L x 1.4 (cm) x 2.5 (cm) = 3.5 (cm) W): Tissue and other Viable, Non-Viable, Fibrin/Slough, Subcutaneous material debrided: Instrument: Curette Bleeding: Minimum Hemostasis Achieved: Pressure End Time: 14:00 Procedural Pain: 0 Post Procedural Pain: 0 Response to Treatment: Procedure was tolerated well Post Debridement Measurements of Total Wound Length: (cm) 1.4 Stage: Category/Stage III Width:  (cm) 2.5 Depth: (cm) 0.3 Volume: (cm) 0.825 Character of Wound/Ulcer Post Improved Debridement: Post Procedure Diagnosis Same as Pre-procedure Electronic Signature(s) Signed: 10/06/2016 5:26:18 PM By: Baltazar Najjar MD Signed: 10/07/2016 5:57:13 PM By: Curtis Sites Entered By: Baltazar Najjar on 10/06/2016 14:18:48 [name] ([id number]) [name], [name] ([id number]) -------------------------------------------------------------------------------- Debridement Details Patient Name: [name] Date of Service: 10/06/2016 1:30 PM Medical Record Patient Account Number: [id number] Number: Treating RN: Curtis Sites 30-May-1935 (80 y.o. Other Clinician: Date of Birth/Sex: Female) Treating [name] Primary Care Provider: Duncan Dull Provider/Extender: G Referring Provider: Denton Brick in Treatment: 7 Debridement Performed for Wound #2 Left Calcaneus Assessment: Performed By: Physician Maxwell Caul, MD Debridement: Debridement Pre-procedure Verification/Time Out Yes - 14:00 Taken: Start Time: 14:00 Pain Control: Lidocaine 4% Topical Solution Level: Skin/Subcutaneous Tissue Total Area Debrided (L x 2.3 (cm) x 3.3 (cm) = 7.59 (cm) W): Tissue and other Viable, Non-Viable, Fibrin/Slough, Subcutaneous material debrided: Instrument: Curette Bleeding: Minimum Hemostasis Achieved: Pressure End Time: 14:02 Procedural Pain: 0 Post Procedural Pain: 0 Response to Treatment: Procedure was tolerated well Post Debridement Measurements of Total Wound Length: (cm) 2.3 Stage: Category/Stage III Width: (cm) 3.3 Depth: (cm) 0.2 Volume: (cm) 1.192 Character of Wound/Ulcer Post Improved Debridement: Post Procedure Diagnosis Same as Pre-procedure Electronic Signature(s) Signed: 10/06/2016 5:26:18 PM By: Baltazar Najjar MD Signed: 10/07/2016 5:57:13 PM By: Curtis Sites Entered By: Baltazar Najjar on 10/06/2016 14:18:58 [name], [name]  ([id number]) [name], [name] ([id number]) -------------------------------------------------------------------------------- HPI Details Patient Name: [name] Date of Service: 10/06/2016 1:30 PM Medical Record Patient Account Number: [id number] Number: Treating RN: Curtis Sites 1935-08-02 (80 y.o. Other Clinician: Date of Birth/Sex: Female) Treating Rasheem Figiel Primary Care Provider: Duncan Dull Provider/Extender: G Referring Provider: Denton Brick in Treatment: 7 History of Present Illness HPI Description: 08/18/16; this is a 81 year old woman who is a type II diabetic not currently on any treatment. She is also listed as having Alzheimer's disease hypertension. She has a history of chronic venous insufficiency with leg wounds in the past but no history of wounds in her feet.  In terms of her diabetes at one point she was on insulin however she is no longer on any current treatment, her daughter who is providing most of the history is unaware of her hemoglobin A1c. She has stage IV chronic renal failure and follows with nephrology. The history provided by her daughter is that she has had a wound on the left heel perhaps dating back to last May/17. At that point she was in the hospital predominantly with psychiatric issues and was [facility]. This was treated with some form of foam dressing and may have actually healed however she was readmitted to hospital in January with Escherichia coli sepsis felt to be secondary to a UTI. Since then she is in [facility] skilled facility. Apparently this timeframe both the left heel and right heel reopened or at least the daughter became aware that they were both open. The exact timeframe isn't really certain Her ABIs in this clinic were 1.08 on the right 1.25 on the left. Looking through [employer] doesn't really provide any useful information. She did have venous ultrasounds in 2016 that did not show  a DVT. I don't see a recent hemoglobin A1c 08/25/16; from last week we have been using Santyl to both heels. Apparently the nursing home didn't get an x-ray of both heels [[facility]] and the daughter states that there was "no injury to bone" but for some reason we haven't been able to get the official report from them. 09/01/16; continued improvement in both wounds on her bilateral heels using Santyl. X-ray report was negative for osteomyelitis 09/08/16; patient from [facility] skilled facility. Using Santyl on both her heel wounds which are fortunately not on the plantar surface. 09/15/16; patient is from [facility] skilled facility. She has been using Santyl on both her pressure areas which were stage III. Fortunately these or not on the plantar surface. 09/22/16; patient arrives today with a much less viable looking surface on her heels. We had been using Santyl with good improvement however unchanged either fair last week. This may be unrelated however she required debridement today. 09/29/16; patient arrives with a better looking surface on the left heel unfortunately this is a more substantial wound. The area on the right lateral calcaneus again covered in a nonviable necrotic surface. We have been making good progress with Santyl. I think I'm going to need to go back to a debriding agent. 10/06/16; still nonviable surface material over the right greater than the left heel. Apparently the facility where this woman resides did not have Iodoflex so rather than calling the clinic they decided to make some cocktail of material and then settled on Hydrofera Blue. Patient is really generally made pretty good Flanigan, Sole ([id number]) progress versus when she came in her first however still has too much nonviable surface especially on the right. Electronic Signature(s) Signed: 10/06/2016 5:26:18 PM By: Baltazar Najjar MD Entered By: Baltazar Najjar on 10/06/2016 14:20:47 Skipper, Yasemin  ([id number]) -------------------------------------------------------------------------------- Physical Exam Details Patient Name: [name] Date of Service: 10/06/2016 1:30 PM Medical Record Patient Account Number: [id number] Number: Treating RN: Curtis Sites 10-Dec-1935 (80 y.o. Other Clinician: Date of Birth/Sex: Female) Treating Aseret Hoffman Primary Care Provider: Duncan Dull Provider/Extender: G Referring Provider: Denton Brick in Treatment: 7 Constitutional Sitting or standing Blood Pressure is within target range for patient.. Pulse regular and within target range for patient.Marland Kitchen Respirations regular, non-labored and within target range.. Temperature is normal and within the target range for the  patient.Marland Kitchen appears in no distress. Cardiovascular Pedal pulses palpable and strong bilaterally.. Notes Exam; bilateral heel which were both stage III pressure ulcers currently although there are unstageable when she first came in. Both areas are provided with a #5 curet. Electronic Signature(s) Signed: 10/06/2016 5:26:18 PM By: Baltazar Najjar MD Entered By: Baltazar Najjar on 10/06/2016 14:21:43 Dysart, Jonne ([id number]) -------------------------------------------------------------------------------- Physician Orders Details Patient Name: [name] Date of Service: 10/06/2016 1:30 PM Medical Record Patient Account Number: [id number] Number: Treating RN: Curtis Sites 11-28-1935 (80 y.o. Other Clinician: Date of Birth/Sex: Female) Treating Jermia Rigsby Primary Care Provider: Duncan Dull Provider/Extender: G Referring Provider: Denton Brick in Treatment: 7 Verbal / Phone Orders: No Diagnosis Coding Wound Cleansing Wound #1 Right Calcaneus o Clean wound with Normal Saline. o May Shower, gently pat wound dry prior to applying new dressing. Wound #2 Left Calcaneus o Clean wound with Normal Saline. o May Shower,  gently pat wound dry prior to applying new dressing. Anesthetic Wound #1 Right Calcaneus o Topical Lidocaine 4% cream applied to wound bed prior to debridement Wound #2 Left Calcaneus o Topical Lidocaine 4% cream applied to wound bed prior to debridement Primary Wound Dressing Wound #1 Right Calcaneus o Santyl Ointment Wound #2 Left Calcaneus o Santyl Ointment Secondary Dressing Wound #1 Right Calcaneus o Gauze and Kerlix/Conform o Foam - heel cups Wound #2 Left Calcaneus o Gauze and Kerlix/Conform o Foam - heel cups Dressing Change Frequency Wound #1 Right Calcaneus Barretta, Destany ([id number]) o Change dressing every day. Wound #2 Left Calcaneus o Change dressing every day. Follow-up Appointments Wound #1 Right Calcaneus o Return Appointment in 1 week. Wound #2 Left Calcaneus o Return Appointment in 1 week. Edema Control Wound #1 Right Calcaneus o Elevate legs to the level of the heart and pump ankles as often as possible Wound #2 Left Calcaneus o Elevate legs to the level of the heart and pump ankles as often as possible Off-Loading Wound #1 Right Calcaneus o Turn and reposition every 2 hours o Other: - wear heel protector boots at al times in bed, float heels while in bed, Wound #2 Left Calcaneus o Turn and reposition every 2 hours o Other: - wear heel protector boots at al times in bed, float heels while in bed, Additional Orders / Instructions Wound #1 Right Calcaneus o Increase protein intake. o Other: - please add vitamin A, vitamin C and zinc supplements to your diet Wound #2 Left Calcaneus o Increase protein intake. o Other: - please add vitamin A, vitamin C and zinc supplements to your diet Medications-please add to medication list. Wound #1 Right Calcaneus o Santyl Enzymatic Ointment Wound #2 Left Calcaneus o Santyl Enzymatic Ointment Electronic Signature(s) Pittman, Ferrell ([id number]) Signed: 10/06/2016  5:26:18 PM By: Baltazar Najjar MD Signed: 10/07/2016 5:57:13 PM By: Curtis Sites Entered By: Curtis Sites on 10/06/2016 14:03:22 Reller, Yailen ([id number]) -------------------------------------------------------------------------------- Problem List Details Patient Name: [name] Date of Service: 10/06/2016 1:30 PM Medical Record Patient Account Number: [id number] Number: Treating RN: Curtis Sites November 24, 1935 (80 y.o. Other Clinician: Date of Birth/Sex: Female) Treating Giordano Getman Primary Care Provider: Duncan Dull Provider/Extender: G Referring Provider: Denton Brick in Treatment: 7 Active Problems ICD-10 Encounter Code Description Active Date Diagnosis E11.621 Type 2 diabetes mellitus with foot ulcer 08/18/2016 Yes L89.623 Pressure ulcer of left heel, stage 3 08/18/2016 Yes L89.613 Pressure ulcer of right heel, stage 3 08/18/2016 Yes Inactive Problems Resolved Problems Electronic Signature(s) Signed: 10/06/2016 5:26:18 PM By: Baltazar Najjar MD Entered  By: Baltazar Najjar on 10/06/2016 14:15:15 Kuhnle, Mariadelosang ([id number]) -------------------------------------------------------------------------------- Progress Note Details Patient Name: CHAMEKA, MCMULLEN Date of Service: 10/06/2016 1:30 PM Medical Record Patient Account Number: [id number] Number: Treating RN: Curtis Sites 07-Apr-1936 (80 y.o. Other Clinician: Date of Birth/Sex: Female) Treating Nesha Counihan Primary Care Provider: Duncan Dull Provider/Extender: G Referring Provider: Denton Brick in Treatment: 7 Subjective Chief Complaint Information obtained from Patient 08/18/16; patient is here for review of bilateral heel ulcers History of Present Illness (HPI) 08/18/16; this is a 81 year old woman who is a type II diabetic not currently on any treatment. She is also listed as having Alzheimer's disease hypertension. She has a history of chronic venous insufficiency  with leg wounds in the past but no history of wounds in her feet. In terms of her diabetes at one point she was on insulin however she is no longer on any current treatment, her daughter who is providing most of the history is unaware of her hemoglobin A1c. She has stage IV chronic renal failure and follows with nephrology. The history provided by her daughter is that she has had a wound on the left heel perhaps dating back to last May/17. At that point she was in the hospital predominantly with psychiatric issues and was discharged to [facility]. This was treated with some form of foam dressing and may have actually healed however she was readmitted to hospital in January with Escherichia coli sepsis felt to be secondary to a UTI. Since then she is in [facility] skilled facility. Apparently this timeframe both the left heel and right heel reopened or at least the daughter became aware that they were both open. The exact timeframe isn't really certain Her ABIs in this clinic were 1.08 on the right 1.25 on the left. Looking through [employer] doesn't really provide any useful information. She did have venous ultrasounds in 2016 that did not show a DVT. I don't see a recent hemoglobin A1c 08/25/16; from last week we have been using Santyl to both heels. Apparently the nursing home didn't get an x-ray of both heels [[facility]] and the daughter states that there was "no injury to bone" but for some reason we haven't been able to get the official report from them. 09/01/16; continued improvement in both wounds on her bilateral heels using Santyl. X-ray report was negative for osteomyelitis 09/08/16; patient from [facility] skilled facility. Using Santyl on both her heel wounds which are fortunately not on the plantar surface. 09/15/16; patient is from [facility] skilled facility. She has been using Santyl on both her pressure areas which were stage III. Fortunately these or not on  the plantar surface. 09/22/16; patient arrives today with a much less viable looking surface on her heels. We had been using Santyl with good improvement however unchanged either fair last week. This may be unrelated however she required debridement today. 09/29/16; patient arrives with a better looking surface on the left heel unfortunately this is a more substantial wound. The area on the right lateral calcaneus again covered in a nonviable necrotic surface. We have Rahe, Zoee ([id number]) been making good progress with Santyl. I think I'm going to need to go back to a debriding agent. 10/06/16; still nonviable surface material over the right greater than the left heel. Apparently the facility where this woman resides did not have Iodoflex so rather than calling the clinic they decided to make some cocktail of material and then settled on Hydrofera Blue. Patient is really generally  made pretty good progress versus when she came in her first however still has too much nonviable surface especially on the right. Objective Constitutional Sitting or standing Blood Pressure is within target range for patient.. Pulse regular and within target range for patient.Marland Kitchen. Respirations regular, non-labored and within target range.. Temperature is normal and within the target range for the patient.Marland Kitchen. appears in no distress. Vitals Time Taken: 1:38 PM, Height: 66 in, Weight: 140.8 lbs, BMI: 22.7, Pulse: 64 bpm, Respiratory Rate: 18 breaths/min, Blood Pressure: 125/46 mmHg. Cardiovascular Pedal pulses palpable and strong bilaterally.. General Notes: Exam; bilateral heel which were both stage III pressure ulcers currently although there are unstageable when she first came in. Both areas are provided with a #5 curet. Integumentary (Hair, Skin) Wound #1 status is Open. Original cause of wound was Pressure Injury. The wound is located on the Right Calcaneus. The wound measures 1.4cm length x 2.5cm width x 0.2cm  depth; 2.749cm^2 area and 0.55cm^3 volume. The wound is limited to skin breakdown. There is no tunneling or undermining noted. There is a large amount of serous drainage noted. The wound margin is flat and intact. There is large (67- 100%) pink granulation within the wound bed. There is a small (1-33%) amount of necrotic tissue within the wound bed including Eschar and Adherent Slough. The periwound skin appearance did not exhibit: Callus, Crepitus, Excoriation, Induration, Rash, Scarring, Dry/Scaly, Maceration, Atrophie Blanche, Cyanosis, Ecchymosis, Hemosiderin Staining, Mottled, Pallor, Rubor, Erythema. Periwound temperature was noted as No Abnormality. The periwound has tenderness on palpation. Wound #2 status is Open. Original cause of wound was Pressure Injury. The wound is located on the Left Calcaneus. The wound measures 2.3cm length x 3.3cm width x 0.1cm depth; 5.961cm^2 area and 0.596cm^3 volume. The wound is limited to skin breakdown. There is no tunneling or undermining noted. There is a large amount of serous drainage noted. The wound margin is flat and intact. There is large (67- 100%) pink granulation within the wound bed. There is a small (1-33%) amount of necrotic tissue within the wound bed including Eschar and Adherent Slough. The periwound skin appearance did not exhibit: Callus, Crepitus, Excoriation, Induration, Rash, Scarring, Dry/Scaly, Maceration, Atrophie Blanche, Cyanosis, Ecchymosis, Hemosiderin Staining, Mottled, Pallor, Rubor, Erythema. Periwound temperature was noted as Neiswonger, Kaiesha ([id number]) No Abnormality. The periwound has tenderness on palpation. Assessment Active Problems ICD-10 E11.621 - Type 2 diabetes mellitus with foot ulcer L89.623 - Pressure ulcer of left heel, stage 3 L89.613 - Pressure ulcer of right heel, stage 3 Procedures Wound #1 Pre-procedure diagnosis of Wound #1 is a Pressure Ulcer located on the Right Calcaneus . There was  a Skin/Subcutaneous Tissue Debridement ([id number]) debridement with total area of 3.5 sq cm performed by Maxwell CaulOBSON, Loel Betancur G, MD. with the following instrument(s): Curette to remove Viable and Non-Viable tissue/material including Fibrin/Slough and Subcutaneous after achieving pain control using Lidocaine 4% Topical Solution. A time out was conducted at 13:57, prior to the start of the procedure. A Minimum amount of bleeding was controlled with Pressure. The procedure was tolerated well with a pain level of 0 throughout and a pain level of 0 following the procedure. Post Debridement Measurements: 1.4cm length x 2.5cm width x 0.3cm depth; 0.825cm^3 volume. Post debridement Stage noted as Category/Stage III. Character of Wound/Ulcer Post Debridement is improved. Post procedure Diagnosis Wound #1: Same as Pre-Procedure Wound #2 Pre-procedure diagnosis of Wound #2 is a Pressure Ulcer located on the Left Calcaneus . There was a Skin/Subcutaneous Tissue Debridement ([id number]) debridement with total  area of 7.59 sq cm performed by Maxwell Caul, MD. with the following instrument(s): Curette to remove Viable and Non-Viable tissue/material including Fibrin/Slough and Subcutaneous after achieving pain control using Lidocaine 4% Topical Solution. A time out was conducted at 14:00, prior to the start of the procedure. A Minimum amount of bleeding was controlled with Pressure. The procedure was tolerated well with a pain level of 0 throughout and a pain level of 0 following the procedure. Post Debridement Measurements: 2.3cm length x 3.3cm width x 0.2cm depth; 1.192cm^3 volume. Post debridement Stage noted as Category/Stage III. Character of Wound/Ulcer Post Debridement is improved. Post procedure Diagnosis Wound #2: Same as Pre-Procedure Dung, Lorilyn ([id number]) Plan Wound Cleansing: Wound #1 Right Calcaneus: Clean wound with Normal Saline. May Shower, gently pat wound dry prior to applying  new dressing. Wound #2 Left Calcaneus: Clean wound with Normal Saline. May Shower, gently pat wound dry prior to applying new dressing. Anesthetic: Wound #1 Right Calcaneus: Topical Lidocaine 4% cream applied to wound bed prior to debridement Wound #2 Left Calcaneus: Topical Lidocaine 4% cream applied to wound bed prior to debridement Primary Wound Dressing: Wound #1 Right Calcaneus: Santyl Ointment Wound #2 Left Calcaneus: Santyl Ointment Secondary Dressing: Wound #1 Right Calcaneus: Gauze and Kerlix/Conform Foam - heel cups Wound #2 Left Calcaneus: Gauze and Kerlix/Conform Foam - heel cups Dressing Change Frequency: Wound #1 Right Calcaneus: Change dressing every day. Wound #2 Left Calcaneus: Change dressing every day. Follow-up Appointments: Wound #1 Right Calcaneus: Return Appointment in 1 week. Wound #2 Left Calcaneus: Return Appointment in 1 week. Edema Control: Wound #1 Right Calcaneus: Elevate legs to the level of the heart and pump ankles as often as possible Wound #2 Left Calcaneus: Elevate legs to the level of the heart and pump ankles as often as possible Off-Loading: Wound #1 Right Calcaneus: Turn and reposition every 2 hours Other: - wear heel protector boots at al times in bed, float heels while in bed, Wound #2 Left Calcaneus: Turn and reposition every 2 hours Lundblad, Monique ([id number]) Other: - wear heel protector boots at al times in bed, float heels while in bed, Additional Orders / Instructions: Wound #1 Right Calcaneus: Increase protein intake. Other: - please add vitamin A, vitamin C and zinc supplements to your diet Wound #2 Left Calcaneus: Increase protein intake. Other: - please add vitamin A, vitamin C and zinc supplements to your diet Medications-please add to medication list.: Wound #1 Right Calcaneus: Santyl Enzymatic Ointment Wound #2 Left Calcaneus: Santyl Enzymatic Ointment #1 in lieu of Iodoflex which the patient doesn't have  access to them the facility where she resides we are going to use Santyl foam kerlix change every second day #2 pressure relief is paramount, she is going to [place] by car 5 counseled them to make sure there is no unrelieved pressure on these areas. #3 if we could get the surface of these a bit healthier than Hydrofera Blue wouldn't be a bad option. Electronic Signature(s) Signed: 10/06/2016 5:26:18 PM By: Baltazar Najjar MD Entered By: Baltazar Najjar on 10/06/2016 14:23:07 Farnan, Azaela ([id number]) -------------------------------------------------------------------------------- SuperBill Details Patient Name: [name] Date of Service: 10/06/2016 Medical Record Patient Account Number: [id number] Number: Treating RN: Curtis Sites 06/06/1935 (80 y.o. Other Clinician: Date of Birth/Sex: Female) Treating Yuma Blucher Primary Care Provider: Duncan Dull Provider/Extender: G Referring Provider: Denton Brick in Treatment: 7 Diagnosis Coding ICD-10 Codes Code Description E11.621 Type 2 diabetes mellitus with foot ulcer L89.623 Pressure ulcer of left heel, stage 3  L89.613 Pressure ulcer of right heel, stage 3 Facility Procedures CPT4 Code: 04540981 Description: 11042 - DEB SUBQ TISSUE 20 SQ CM/< ICD-10 Description Diagnosis E11.621 Type 2 diabetes mellitus with foot ulcer L89.623 Pressure ulcer of left heel, stage 3 L89.613 Pressure ulcer of right heel, stage 3 Modifier: Quantity: 1 Physician Procedures CPT4 Code: 1914782 Description: 11042 - WC PHYS SUBQ TISS 20 SQ CM ICD-10 Description Diagnosis E11.621 Type 2 diabetes mellitus with foot ulcer L89.623 Pressure ulcer of left heel, stage 3 L89.613 Pressure ulcer of right heel, stage 3 Modifier: Quantity: 1 Electronic Signature(s) Signed: 10/06/2016 5:26:18 PM By: Baltazar Najjar MD Entered By: Baltazar Najjar on 10/06/2016 14:23:32

## 2016-10-09 NOTE — Progress Notes (Signed)
Buford, Gayler Laya ([id number]) Visit Report for 10/06/2016 Arrival Information Details Patient Name: [name], [name] Date of Service: 10/06/2016 1:30 PM Medical Record Patient Account Number: [id number] Number: Treating RN: Curtis Sites July 05, 1935 (80 y.o. Other Clinician: Date of Birth/Sex: Female) Treating ROBSON, [name] Primary Care Pattrick Bady: Duncan Dull Ashlee Bewley/Extender: G Referring Kaelei Wheeler: Denton Brick in Treatment: 7 Visit Information History Since Last Visit Added or deleted any medications: No Patient Arrived: Walker Any new allergies or adverse reactions: No Arrival Time: 13:35 Had a fall or experienced change in No Accompanied By: dtr activities of daily living that may affect Transfer Assistance: None risk of falls: Patient Identification Verified: Yes Signs or symptoms of abuse/neglect since last No Secondary Verification Process Completed: Yes visito Patient Requires Transmission-Based No Hospitalized since last visit: No Precautions: Has Dressing in Place as Prescribed: No Patient Has Alerts: Yes Pain Present Now: No Patient Alerts: DM II Electronic Signature(s) Signed: 10/07/2016 5:57:13 PM By: Curtis Sites Entered By: Curtis Sites on 10/06/2016 13:38:42 Ovando, Sharleen ([id number]) -------------------------------------------------------------------------------- Encounter Discharge Information Details Patient Name: [name] Date of Service: 10/06/2016 1:30 PM Medical Record Patient Account Number: [id number] Number: Treating RN: Curtis Sites 05-Dec-1935 (80 y.o. Other Clinician: Date of Birth/Sex: Female) Treating ROBSON, [name] Primary Care Hser Belanger: Duncan Dull Sofiya Ezelle/Extender: G Referring Fusae Florio: Denton Brick in Treatment: 7 Encounter Discharge Information Items Discharge Pain Level: 0 Discharge Condition: Stable Ambulatory Status: Walker Discharge Destination: Nursing Home Transportation:  Private Auto Accompanied By: dtr Schedule Follow-up Appointment: Yes Medication Reconciliation completed No and provided to Patient/Care Lashanti Chambless: Provided on Clinical Summary of Care: 10/06/2016 Form Type Recipient Paper Patient [facility] Electronic Signature(s) Signed: 10/06/2016 2:22:20 PM By: Gwenlyn Perking Entered By: Gwenlyn Perking on 10/06/2016 14:22:20 Mccarley, Tosha ([id number]) -------------------------------------------------------------------------------- Lower Extremity Assessment Details Patient Name: [name] Date of Service: 10/06/2016 1:30 PM Medical Record Patient Account Number: [id number] Number: Treating RN: Curtis Sites [birth year] (80 y.o. Other Clinician: Date of Birth/Sex: Female) Treating ROBSON, [name] Primary Care Diontae Route: Duncan Dull Tracy Gerken/Extender: G Referring Hanley Rispoli: Denton Brick in Treatment: 7 Vascular Assessment Pulses: Dorsalis Pedis Palpable: [Left:Yes] [Right:Yes] Posterior Tibial Extremity colors, hair growth, and conditions: Extremity Color: [Left:Hyperpigmented] [Right:Hyperpigmented] Hair Growth on Extremity: [Left:No] [Right:No] Temperature of Extremity: [Left:Warm] [Right:Warm] Capillary Refill: [Left:< 3 seconds] [Right:< 3 seconds] Electronic Signature(s) Signed: 10/06/2016 2:42:39 PM By: Curtis Sites Entered By: Curtis Sites on 10/06/2016 14:42:39 [place], Maigen ([id number]) -------------------------------------------------------------------------------- Multi Wound Chart Details Patient Name: [name] Date of Service: 10/06/2016 1:30 PM Medical Record Patient Account Number: [id number] Number: Treating RN: Curtis Sites 09/13/1935 (80 y.o. Other Clinician: Date of Birth/Sex: Female) Treating ROBSON, [name] Primary Care Tynika Luddy: Duncan Dull Dametrius Sanjuan/Extender: G Referring Heide Brossart: Denton Brick in Treatment: 7 Vital Signs Height(in): 66 Pulse(bpm): 64 Weight(lbs):  140.8 Blood Pressure 125/46 (mmHg): Body Mass Index(BMI): 23 Temperature(F): Respiratory Rate 18 (breaths/min): Photos: [1:No Photos] [2:No Photos] [N/A:N/A] Wound Location: [1:Right Calcaneus] [2:Left Calcaneus] [N/A:N/A] Wounding Event: [1:Pressure Injury] [2:Pressure Injury] [N/A:N/A] Primary Etiology: [1:Pressure Ulcer] [2:Pressure Ulcer] [N/A:N/A] Comorbid History: [1:Cataracts, Anemia, Congestive Heart Failure, Congestive Heart Failure, Hypertension, Type II Diabetes, Gout, Dementia, Diabetes, Gout, Dementia, Neuropathy] [2:Cataracts, Anemia, Hypertension, Type II Neuropathy] [N/A:N/A] Date Acquired: [1:07/05/2016] [2:07/05/2016] [N/A:N/A] Weeks of Treatment: [1:7] [2:7] [N/A:N/A] Wound Status: [1:Open] [2:Open] [N/A:N/A] Measurements L x W x D 1.4x2.5x0.2 [2:2.3x3.3x0.1] [N/A:N/A] (cm) Area (cm) : [1:2.749] [2:5.961] [N/A:N/A] Volume (cm) : [1:0.55] [2:0.596] [N/A:N/A] % Reduction in Area: [1:61.10%] [2:45.80%] [N/A:N/A] % Reduction in Volume: 61.10% [2:72.90%] [N/A:N/A] Classification: [1:Category/Stage III] [2:Category/Stage III] [N/A:N/A] HBO Classification: [  1:Grade 1] [2:Grade 1] [N/A:N/A] Exudate Amount: [1:Large] [2:Large] [N/A:N/A] Exudate Type: [1:Serous] [2:Serous] [N/A:N/A] Exudate Color: [1:amber] [2:amber] [N/A:N/A] Foul Odor After [1:Yes] [2:Yes] [N/A:N/A] Cleansing: Odor Anticipated Due to No [2:No] [N/A:N/A] Product Use: Wound Margin: [1:Flat and Intact] [2:Flat and Intact] [N/A:N/A] Granulation Amount: Large (67-100%) Large (67-100%) N/A Granulation Quality: Pink Pink N/A Necrotic Amount: Small (1-33%) Small (1-33%) N/A Necrotic Tissue: Eschar, Adherent Slough Eschar, Adherent Slough N/A Exposed Structures: Fascia: No Fascia: No N/A Fat Layer (Subcutaneous Fat Layer (Subcutaneous Tissue) Exposed: No Tissue) Exposed: No Tendon: No Tendon: No Muscle: No Muscle: No Joint: No Joint: No Bone: No Bone: No Limited to Skin Limited to  Skin Breakdown Breakdown Epithelialization: None None N/A Debridement: Debridement (16109- Debridement (60454- N/A 11047) 11047) Pre-procedure 13:57 14:00 N/A Verification/Time Out Taken: Pain Control: Lidocaine 4% Topical Lidocaine 4% Topical N/A Solution Solution Tissue Debrided: Fibrin/Slough, Fibrin/Slough, N/A Subcutaneous Subcutaneous Level: Skin/Subcutaneous Skin/Subcutaneous N/A Tissue Tissue Debridement Area (sq 3.5 7.59 N/A cm): Instrument: Curette Curette N/A Bleeding: Minimum Minimum N/A Hemostasis Achieved: Pressure Pressure N/A Procedural Pain: 0 0 N/A Post Procedural Pain: 0 0 N/A Debridement Treatment Procedure was tolerated Procedure was tolerated N/A Response: well well Post Debridement 1.4x2.5x0.3 2.3x3.3x0.2 N/A Measurements L x W x D (cm) Post Debridement 0.825 1.192 N/A Volume: (cm) Post Debridement Category/Stage III Category/Stage III N/A Stage: Periwound Skin Texture: Excoriation: No Excoriation: No N/A Induration: No Induration: No Callus: No Callus: No Crepitus: No Crepitus: No Rash: No Rash: No Scarring: No Scarring: No Periwound Skin Maceration: No Maceration: No N/A Moisture: Dry/Scaly: No Dry/Scaly: No Periwound Skin Color: N/A Portlock, Johnna ([id number]) Atrophie Blanche: No Atrophie Blanche: No Cyanosis: No Cyanosis: No Ecchymosis: No Ecchymosis: No Erythema: No Erythema: No Hemosiderin Staining: No Hemosiderin Staining: No Mottled: No Mottled: No Pallor: No Pallor: No Rubor: No Rubor: No Temperature: No Abnormality No Abnormality N/A Tenderness on Yes Yes N/A Palpation: Wound Preparation: Ulcer Cleansing: Ulcer Cleansing: N/A Rinsed/Irrigated with Rinsed/Irrigated with Saline Saline Topical Anesthetic Topical Anesthetic Applied: Other: lidocaine Applied: Other: lidocaine 4% 4% Procedures Performed: Debridement Debridement N/A Treatment Notes Electronic Signature(s) Signed: 10/06/2016 5:26:18 PM By: Baltazar Najjar MD Entered By: Baltazar Najjar on 10/06/2016 14:15:24 Casteneda, Hurley ([id number]) -------------------------------------------------------------------------------- Multi-Disciplinary Care Plan Details Patient Name: [name] Date of Service: 10/06/2016 1:30 PM Medical Record Patient Account Number: [id number] Number: Treating RN: Curtis Sites 24-Feb-1936 (80 y.o. Other Clinician: Date of Birth/Sex: Female) Treating ROBSON, [name] Primary Care Lilyana Lippman: Duncan Dull [place]/Extender: G Referring Sanai Frick: Denton Brick in Treatment: 7 Active Inactive ` Abuse / Safety / Falls / Self Care Management Nursing Diagnoses: Impaired physical mobility Potential for falls Goals: Patient will remain injury free Date Initiated: 08/18/2016 Target Resolution Date: 10/29/2016 Goal Status: Active Interventions: Assess fall risk on admission and as needed Notes: ` Nutrition Nursing Diagnoses: Potential for alteratiion in Nutrition/Potential for imbalanced nutrition Goals: Patient/caregiver agrees to and verbalizes understanding of need to use nutritional supplements and/or vitamins as prescribed Date Initiated: 08/18/2016 Target Resolution Date: 10/29/2016 Goal Status: Active Interventions: Assess patient nutrition upon admission and as needed per policy Notes: ` Orientation to the Wound Care Program Globe, Salimah ([id number]) Nursing Diagnoses: Knowledge deficit related to the wound healing center program Goals: Patient/caregiver will verbalize understanding of the Wound Healing Center Program Date Initiated: 08/18/2016 Target Resolution Date: 10/29/2016 Goal Status: Active Interventions: Provide education on orientation to the wound center Notes: ` Wound/Skin Impairment Nursing Diagnoses: Impaired tissue integrity Goals: Patient/caregiver will verbalize understanding of skin care regimen Date Initiated:  08/18/2016 Target Resolution Date:  10/29/2016 Goal Status: Active Ulcer/skin breakdown will have a volume reduction of 30% by week 4 Date Initiated: 08/18/2016 Target Resolution Date: 10/29/2016 Goal Status: Active Ulcer/skin breakdown will have a volume reduction of 50% by week 8 Date Initiated: 08/18/2016 Target Resolution Date: 10/29/2016 Goal Status: Active Ulcer/skin breakdown will have a volume reduction of 80% by week 12 Date Initiated: 08/18/2016 Target Resolution Date: 10/29/2016 Goal Status: Active Ulcer/skin breakdown will heal within 14 weeks Date Initiated: 08/18/2016 Target Resolution Date: 10/29/2016 Goal Status: Active Interventions: Assess patient/caregiver ability to obtain necessary supplies Assess patient/caregiver ability to perform ulcer/skin care regimen upon admission and as needed Assess ulceration(s) every visit Notes: Electronic Signature(s) Signed: 10/07/2016 5:57:13 PM By: Charlotta Newtonorthy, Joanna Menzie, Josie ([id number]) Entered By: Curtis Sitesorthy, Joanna on 10/06/2016 13:52:48 Barrack, Kyeisha ([id number]) -------------------------------------------------------------------------------- Pain Assessment Details Patient Name: [name], [name] Date of Service: 10/06/2016 1:30 PM Medical Record Patient Account Number: [id number] Number: Treating RN: Curtis SitesDorthy, Joanna [birth year] (80 y.o. Other Clinician: Date of Birth/Sex: Female) Treating ROBSON, [name] Primary Care Jairon Ripberger: Duncan Dullullo, Teresa Hania Cerone/Extender: G Referring Tyannah Sane: Denton Brickullo, Teresa Weeks in Treatment: 7 Active Problems Location of Pain Severity and Description of Pain Patient Has Paino No Site Locations Pain Management and Medication Current Pain Management: Notes Topical or injectable lidocaine is offered to patient for acute pain when surgical debridement is performed. If needed, Patient is instructed to use over the counter pain medication for the following 24-48 hours after debridement. Wound care MDs do not prescribed pain  medications. Patient has chronic pain or uncontrolled pain. Patient has been instructed to make an appointment with their Primary Care Physician for pain management. Electronic Signature(s) Signed: 10/07/2016 5:57:13 PM By: Curtis Sitesorthy, Joanna Entered By: Curtis Sitesorthy, Joanna on 10/06/2016 13:38:50 Kolarik, Antoine ([id number]) -------------------------------------------------------------------------------- Patient/Caregiver Education Details Patient Name: [name], [name] Date of Service: 10/06/2016 1:30 PM Medical Record Patient Account Number: [id number] Number: Treating RN: Curtis SitesDorthy, Joanna [birth year] (80 y.o. Other Clinician: Date of Birth/Gender: Female) Treating ROBSON, [name] Primary Care Physician/Extender: Virgil BenedictG Tullo, Teresa Physician: Tania AdeWeeks in Treatment: 7 Referring Physician: Duncan Dullullo, Teresa Education Assessment Education Provided To: Caregiver SNF nurses via written orders Education Topics Provided Wound/Skin Impairment: Handouts: Other: wound care orders Methods: Printed Electronic Signature(s) Signed: 10/07/2016 5:57:13 PM By: Curtis Sitesorthy, Joanna Entered By: Curtis Sitesorthy, Joanna on 10/06/2016 13:57:04 Newey, Jackee ([id number]) -------------------------------------------------------------------------------- Wound Assessment Details Patient Name: [name], [name] Date of Service: 10/06/2016 1:30 PM Medical Record Patient Account Number: [id number] Number: Treating RN: Curtis SitesDorthy, Joanna [birth year] (80 y.o. Other Clinician: Date of Birth/Sex: Female) Treating ROBSON, [name] Primary Care Jilian West: Duncan Dullullo, Teresa Kameka Whan/Extender: G Referring Edmon Magid: Denton Brickullo, Teresa Weeks in Treatment: 7 Wound Status Wound Number: 1 Primary Pressure Ulcer Etiology: Wound Location: Right Calcaneus Wound Open Wounding Event: Pressure Injury Status: Date Acquired: 07/05/2016 Comorbid Cataracts, Anemia, Congestive Heart Weeks Of Treatment: 7 History: Failure, Hypertension, Type II  Diabetes, Clustered Wound: No Gout, Dementia, Neuropathy Photos Photo Uploaded By: Curtis Sitesorthy, Joanna on 10/07/2016 12:05:44 Wound Measurements Length: (cm) 1.4 Width: (cm) 2.5 Depth: (cm) 0.2 Area: (cm) 2.749 Volume: (cm) 0.55 % Reduction in Area: 61.1% % Reduction in Volume: 61.1% Epithelialization: None Tunneling: No Undermining: No Wound Description Classification: Category/Stage III Foul Odor Afte Diabetic Severity (Wagner): Grade 1 Due to Product Wound Margin: Flat and Intact Slough/Fibrino Exudate Amount: Large Exudate Type: Serous Exudate Color: amber r Cleansing: Yes Use: No No Wound Bed Granulation Amount: Large (67-100%) Exposed Structure Aguinaldo, Cariana ([id number]) Granulation Quality: Pink Fascia Exposed: No Necrotic Amount: Small (1-33%) Fat Layer (Subcutaneous Tissue)  Exposed: No Necrotic Quality: Eschar, Adherent Slough Tendon Exposed: No Muscle Exposed: No Joint Exposed: No Bone Exposed: No Limited to Skin Breakdown Periwound Skin Texture Texture Color No Abnormalities Noted: No No Abnormalities Noted: No Callus: No Atrophie Blanche: No Crepitus: No Cyanosis: No Excoriation: No Ecchymosis: No Induration: No Erythema: No Rash: No Hemosiderin Staining: No Scarring: No Mottled: No Pallor: No Moisture Rubor: No No Abnormalities Noted: No Dry / Scaly: No Temperature / Pain Maceration: No Temperature: No Abnormality Tenderness on Palpation: Yes Wound Preparation Ulcer Cleansing: Rinsed/Irrigated with Saline Topical Anesthetic Applied: Other: lidocaine 4%, Treatment Notes Wound #1 (Right Calcaneus) 1. Cleansed with: Clean wound with Normal Saline 2. Anesthetic Topical Lidocaine 4% cream to wound bed prior to debridement 4. Dressing Applied: Santyl Ointment 5. Secondary Dressing Applied Foam Gauze and Kerlix/Conform 7. Secured with Secretary/administrator) Signed: 10/07/2016 5:57:13 PM By: Curtis Sites Entered By: Curtis Sites on 10/06/2016 13:52:07 Cochrane, Grete ([id number]) -------------------------------------------------------------------------------- Wound Assessment Details Patient Name: [name] Date of Service: 10/06/2016 1:30 PM Medical Record Patient Account Number: [id number] Number: Treating RN: Curtis Sites 16-Mar-1936 (80 y.o. Other Clinician: Date of Birth/Sex: Female) Treating ROBSON, [name] Primary Care Hiliary Osorto: Duncan Dull Perle Gibbon/Extender: G Referring Aristide Waggle: Denton Brick in Treatment: 7 Wound Status Wound Number: 2 Primary Pressure Ulcer Etiology: Wound Location: Left Calcaneus Wound Open Wounding Event: Pressure Injury Status: Date Acquired: 07/05/2016 Comorbid Cataracts, Anemia, Congestive Heart Weeks Of Treatment: 7 History: Failure, Hypertension, Type II Diabetes, Clustered Wound: No Gout, Dementia, Neuropathy Photos Photo Uploaded By: Curtis Sites on 10/07/2016 12:06:02 Wound Measurements Length: (cm) 2.3 Width: (cm) 3.3 Depth: (cm) 0.1 Area: (cm) 5.961 Volume: (cm) 0.596 % Reduction in Area: 45.8% % Reduction in Volume: 72.9% Epithelialization: None Tunneling: No Undermining: No Wound Description Classification: Category/Stage III Foul Odor Afte Diabetic Severity (Wagner): Grade 1 Due to Product Wound Margin: Flat and Intact Slough/Fibrino Exudate Amount: Large Exudate Type: Serous Exudate Color: amber r Cleansing: Yes Use: No No Wound Bed Granulation Amount: Large (67-100%) Exposed Structure Stradford, Florabel ([id number]) Granulation Quality: Pink Fascia Exposed: No Necrotic Amount: Small (1-33%) Fat Layer (Subcutaneous Tissue) Exposed: No Necrotic Quality: Eschar, Adherent Slough Tendon Exposed: No Muscle Exposed: No Joint Exposed: No Bone Exposed: No Limited to Skin Breakdown Periwound Skin Texture Texture Color No Abnormalities Noted: No No Abnormalities Noted: No Callus: No Atrophie Blanche:  No Crepitus: No Cyanosis: No Excoriation: No Ecchymosis: No Induration: No Erythema: No Rash: No Hemosiderin Staining: No Scarring: No Mottled: No Pallor: No Moisture Rubor: No No Abnormalities Noted: No Dry / Scaly: No Temperature / Pain Maceration: No Temperature: No Abnormality Tenderness on Palpation: Yes Wound Preparation Ulcer Cleansing: Rinsed/Irrigated with Saline Topical Anesthetic Applied: Other: lidocaine 4%, Treatment Notes Wound #2 (Left Calcaneus) 1. Cleansed with: Clean wound with Normal Saline 2. Anesthetic Topical Lidocaine 4% cream to wound bed prior to debridement 4. Dressing Applied: Santyl Ointment 5. Secondary Dressing Applied Foam Gauze and Kerlix/Conform 7. Secured with Secretary/administrator) Signed: 10/07/2016 5:57:13 PM By: Curtis Sites Entered By: Curtis Sites on 10/06/2016 13:52:39 Merkley, Nasira ([id number]) -------------------------------------------------------------------------------- Vitals Details Patient Name: [name] Date of Service: 10/06/2016 1:30 PM Medical Record Patient Account Number: [id number] Number: Treating RN: Curtis Sites [birth year] (80 y.o. Other Clinician: Date of Birth/Sex: Female) Treating ROBSON, [name] Primary Care Jenevieve Kirschbaum: Duncan Dull [place]/Extender: G Referring Loralai Eisman: Denton Brick in Treatment: 7 Vital Signs Time Taken: 13:38 Pulse (bpm): 64 Height (in): 66 Respiratory Rate (breaths/min): 18 Weight (lbs): 140.8 Blood Pressure (mmHg):  125/46 Body Mass Index (BMI): 22.7 Reference Range: 80 - 120 mg / dl Electronic Signature(s) Signed: 10/07/2016 5:57:13 PM By: Curtis Sites Entered By: Curtis Sites on 10/06/2016 13:39:09

## 2016-10-13 ENCOUNTER — Encounter: Payer: Medicare (Managed Care) | Admitting: Internal Medicine

## 2016-10-13 DIAGNOSIS — E11621 Type 2 diabetes mellitus with foot ulcer: Secondary | ICD-10-CM | POA: Diagnosis not present

## 2016-10-15 NOTE — Progress Notes (Signed)
Inetta, Dicke Joniqua ([id number]) Visit Report for 10/13/2016 Arrival Information Details Patient Name: NIRA, VISSCHER Date of Service: 10/13/2016 12:30 PM Medical Record Patient Account Number: [id number] Number: Treating RN: Curtis Sites [birth year] (81 y.o. Other Clinician: Date of Birth/Sex: Female) Treating ROBSON, [name] Primary Care Kambria Grima: Duncan Dull Lugene Beougher/Extender: G Referring Hagop Mccollam: Denton Brick in Treatment: 8 Visit Information History Since Last Visit Added or deleted any medications: No Patient Arrived: Walker Any new allergies or adverse reactions: No Arrival Time: 12:39 Had a fall or experienced change in No Accompanied By: dtr activities of daily living that may affect Transfer Assistance: None risk of falls: Patient Identification Verified: Yes Signs or symptoms of abuse/neglect since last No Secondary Verification Process Completed: Yes visito Patient Requires Transmission-Based No Hospitalized since last visit: No Precautions: Has Dressing in Place as Prescribed: Yes Patient Has Alerts: Yes Pain Present Now: No Patient Alerts: DM II Electronic Signature(s) Signed: 10/13/2016 5:44:09 PM By: Curtis Sites Entered By: Curtis Sites on 10/13/2016 12:44:01 Lurz, Kelda ([id number]) -------------------------------------------------------------------------------- Clinic Level of Care Assessment Details Patient Name: [name] Date of Service: 10/13/2016 12:30 PM Medical Record Patient Account Number: [id number] Number: Treating RN: Curtis Sites [birth year] (81 y.o. Other Clinician: Date of Birth/Sex: Female) Treating ROBSON, [name] Primary Care Jan Walters: Duncan Dull [place]/Extender: G Referring [place]: Denton Brick in Treatment: 8 Clinic Level of Care Assessment Items TOOL 4 Quantity Score []  - Use when only an EandM is performed on FOLLOW-UP visit 0 ASSESSMENTS - Nursing Assessment /  Reassessment X - Reassessment of Co-morbidities (includes updates in patient status) 1 10 X - Reassessment of Adherence to Treatment Plan 1 5 ASSESSMENTS - Wound and Skin Assessment / Reassessment []  - Simple Wound Assessment / Reassessment - one wound 0 X - Complex Wound Assessment / Reassessment - multiple wounds 2 5 []  - Dermatologic / Skin Assessment (not related to wound area) 0 ASSESSMENTS - Focused Assessment []  - Circumferential Edema Measurements - multi extremities 0 []  - Nutritional Assessment / Counseling / Intervention 0 X - Lower Extremity Assessment (monofilament, tuning fork, pulses) 1 5 []  - Peripheral Arterial Disease Assessment (using hand held doppler) 0 ASSESSMENTS - Ostomy and/or Continence Assessment and Care []  - Incontinence Assessment and Management 0 []  - Ostomy Care Assessment and Management (repouching, etc.) 0 PROCESS - Coordination of Care X - Simple Patient / Family Education for ongoing care 1 15 []  - Complex (extensive) Patient / Family Education for ongoing care 0 []  - Staff obtains Chiropractor, Records, Test Results / Process Orders 0 []  - Staff telephones HHA, Nursing Homes / Clarify orders / etc 0 Freyre, Avi ([id number]) []  - Routine Transfer to another Facility (non-emergent condition) 0 []  - Routine Hospital Admission (non-emergent condition) 0 []  - New Admissions / Manufacturing engineer / Ordering NPWT, Apligraf, etc. 0 []  - Emergency Hospital Admission (emergent condition) 0 X - Simple Discharge Coordination 1 10 []  - Complex (extensive) Discharge Coordination 0 PROCESS - Special Needs []  - Pediatric / Minor Patient Management 0 []  - Isolation Patient Management 0 []  - Hearing / Language / Visual special needs 0 []  - Assessment of Community assistance (transportation, D/C planning, etc.) 0 []  - Additional assistance / Altered mentation 0 []  - Support Surface(s) Assessment (bed, cushion, seat, etc.) 0 INTERVENTIONS - Wound Cleansing /  Measurement []  - Simple Wound Cleansing - one wound 0 X - Complex Wound Cleansing - multiple wounds 2 5 X - Wound Imaging (photographs - any number of wounds) 1 5 []  -  Wound Tracing (instead of photographs) 0 []  - Simple Wound Measurement - one wound 0 X - Complex Wound Measurement - multiple wounds 2 5 INTERVENTIONS - Wound Dressings X - Small Wound Dressing one or multiple wounds 2 10 []  - Medium Wound Dressing one or multiple wounds 0 []  - Large Wound Dressing one or multiple wounds 0 []  - Application of Medications - topical 0 []  - Application of Medications - injection 0 Gombos, Corbyn ([id number]) INTERVENTIONS - Miscellaneous []  - External ear exam 0 []  - Specimen Collection (cultures, biopsies, blood, body fluids, etc.) 0 []  - Specimen(s) / Culture(s) sent or taken to Lab for analysis 0 []  - Patient Transfer (multiple staff / Michiel Sites Lift / Similar devices) 0 []  - Simple Staple / Suture removal (25 or less) 0 []  - Complex Staple / Suture removal (26 or more) 0 []  - Hypo / Hyperglycemic Management (close monitor of Blood Glucose) 0 []  - Ankle / Brachial Index (ABI) - do not check if billed separately 0 X - Vital Signs 1 5 Has the patient been seen at the hospital within the last three years: Yes Total Score: 105 Level Of Care: New/Established - Level 3 Electronic Signature(s) Signed: 10/13/2016 5:44:09 PM By: Curtis Sites Entered By: Curtis Sites on 10/13/2016 14:12:50 Renville, Rodneisha ([id number]) -------------------------------------------------------------------------------- Encounter Discharge Information Details Patient Name: [name] Date of Service: 10/13/2016 12:30 PM Medical Record Patient Account Number: [id number] Number: Treating RN: Curtis Sites 1935-09-17 (81 y.o. Other Clinician: Date of Birth/Sex: Female) Treating ROBSON, [name] Primary Care Asyia Hornung: Duncan Dull Dynisha Due/Extender: G Referring Marthena Whitmyer: Denton Brick in  Treatment: 8 Encounter Discharge Information Items Discharge Pain Level: 0 Discharge Condition: Stable Ambulatory Status: Walker Discharge Destination: Nursing Home Transportation: Private Auto Accompanied By: dtr Schedule Follow-up Appointment: Yes Medication Reconciliation completed No and provided to Patient/Care Elleanor Guyett: Provided on Clinical Summary of Care: 10/13/2016 Form Type Recipient Paper Patient [facility] Electronic Signature(s) Signed: 10/13/2016 1:21:46 PM By: Gwenlyn Perking Entered By: Gwenlyn Perking on 10/13/2016 13:21:46 Buell, Nakiesha ([id number]) -------------------------------------------------------------------------------- Lower Extremity Assessment Details Patient Name: [name] Date of Service: 10/13/2016 12:30 PM Medical Record Patient Account Number: [id number] Number: Treating RN: Curtis Sites [birth year] (80 y.o. Other Clinician: Date of Birth/Sex: Female) Treating ROBSON, [name] Primary Care Cyan Moultrie: Duncan Dull Tatsuya Okray/Extender: G Referring Ahijah Devery: Denton Brick in Treatment: 8 Vascular Assessment Pulses: Dorsalis Pedis Palpable: [Left:Yes] [Right:Yes] Posterior Tibial Extremity colors, hair growth, and conditions: Extremity Color: [Left:Hyperpigmented] [Right:Hyperpigmented] Hair Growth on Extremity: [Left:No] [Right:No] Temperature of Extremity: [Left:Warm] [Right:Warm] Capillary Refill: [Left:< 3 seconds] [Right:< 3 seconds] Electronic Signature(s) Signed: 10/13/2016 5:44:09 PM By: Curtis Sites Entered By: Curtis Sites on 10/13/2016 13:02:34 Depaoli, Diavion ([id number]) -------------------------------------------------------------------------------- Multi Wound Chart Details Patient Name: [name] Date of Service: 10/13/2016 12:30 PM Medical Record Patient Account Number: [id number] Number: Treating RN: Curtis Sites 07-May-1935 (80 y.o. Other Clinician: Date of Birth/Sex: Female) Treating  ROBSON, [name] Primary Care Danesha Kirchoff: Duncan Dull Harmonee Tozer/Extender: G Referring Hellen Shanley: Denton Brick in Treatment: 8 Vital Signs Height(in): 66 Pulse(bpm): 64 Weight(lbs): 140.8 Blood Pressure 135/44 (mmHg): Body Mass Index(BMI): 23 Temperature(F): 98.2 Respiratory Rate 18 (breaths/min): Photos: [1:No Photos] [2:No Photos] [N/A:N/A] Wound Location: [1:Right Calcaneus] [2:Left Calcaneus] [N/A:N/A] Wounding Event: [1:Pressure Injury] [2:Pressure Injury] [N/A:N/A] Primary Etiology: [1:Pressure Ulcer] [2:Pressure Ulcer] [N/A:N/A] Comorbid History: [1:Cataracts, Anemia, Congestive Heart Failure, Congestive Heart Failure, Hypertension, Type II Diabetes, Gout, Dementia, Diabetes, Gout, Dementia, Neuropathy] [2:Cataracts, Anemia, Hypertension, Type II Neuropathy] [N/A:N/A] Date Acquired: [1:07/05/2016] [2:07/05/2016] [N/A:N/A] Weeks of Treatment: [1:8] [2:8] [  N/A:N/A] Wound Status: [1:Open] [2:Open] [N/A:N/A] Measurements L x W x D 1.1x2.7x0.2 [2:2.5x2.8x0.1] [N/A:N/A] (cm) Area (cm) : [1:2.333] [2:5.498] [N/A:N/A] Volume (cm) : [1:0.467] [2:0.55] [N/A:N/A] % Reduction in Area: [1:67.00%] [2:50.00%] [N/A:N/A] % Reduction in Volume: 67.00% [2:75.00%] [N/A:N/A] Classification: [1:Category/Stage III] [2:Category/Stage III] [N/A:N/A] HBO Classification: [1:Grade 1] [2:Grade 1] [N/A:N/A] Exudate Amount: [1:Large] [2:Large] [N/A:N/A] Exudate Type: [1:Serous] [2:Serous] [N/A:N/A] Exudate Color: [1:amber] [2:amber] [N/A:N/A] Foul Odor After [1:Yes] [2:Yes] [N/A:N/A] Cleansing: Odor Anticipated Due to No [2:No] [N/A:N/A] Product Use: Wound Margin: [1:Flat and Intact] [2:Flat and Intact] [N/A:N/A] Granulation Amount: Large (67-100%) Large (67-100%) N/A Granulation Quality: Pink Pink N/A Necrotic Amount: Small (1-33%) Small (1-33%) N/A Necrotic Tissue: Eschar, Adherent Slough Eschar, Adherent Slough N/A Exposed Structures: Fascia: No Fascia: No N/A Fat Layer (Subcutaneous  Fat Layer (Subcutaneous Tissue) Exposed: No Tissue) Exposed: No Tendon: No Tendon: No Muscle: No Muscle: No Joint: No Joint: No Bone: No Bone: No Limited to Skin Limited to Skin Breakdown Breakdown Epithelialization: None None N/A Periwound Skin Texture: Excoriation: No Excoriation: No N/A Induration: No Induration: No Callus: No Callus: No Crepitus: No Crepitus: No Rash: No Rash: No Scarring: No Scarring: No Periwound Skin Maceration: No Maceration: No N/A Moisture: Dry/Scaly: No Dry/Scaly: No Periwound Skin Color: Atrophie Blanche: No Atrophie Blanche: No N/A Cyanosis: No Cyanosis: No Ecchymosis: No Ecchymosis: No Erythema: No Erythema: No Hemosiderin Staining: No Hemosiderin Staining: No Mottled: No Mottled: No Pallor: No Pallor: No Rubor: No Rubor: No Temperature: No Abnormality No Abnormality N/A Tenderness on Yes Yes N/A Palpation: Wound Preparation: Ulcer Cleansing: Ulcer Cleansing: N/A Rinsed/Irrigated with Rinsed/Irrigated with Saline Saline Topical Anesthetic Topical Anesthetic Applied: Other: lidocaine Applied: Other: lidocaine 4% 4% Treatment Notes Electronic Signature(s) Signed: 10/13/2016 5:43:18 PM By: Baltazar Najjar MD Entered By: Baltazar Najjar on 10/13/2016 13:36:39 Pittinger, Makaelah ([id number]) -------------------------------------------------------------------------------- Multi-Disciplinary Care Plan Details Patient Name: [name] Date of Service: 10/13/2016 12:30 PM Medical Record Patient Account Number: [id number] Number: Treating RN: Curtis Sites Mar 24, 1936 (80 y.o. Other Clinician: Date of Birth/Sex: Female) Treating ROBSON, [name] Primary Care Wadsworth Skolnick: Duncan Dull Stefanny Pieri/Extender: G Referring Anesha Hackert: Denton Brick in Treatment: 8 Active Inactive ` Abuse / Safety / Falls / Self Care Management Nursing Diagnoses: Impaired physical mobility Potential for falls Goals: Patient will  remain injury free Date Initiated: 08/18/2016 Target Resolution Date: 10/29/2016 Goal Status: Active Interventions: Assess fall risk on admission and as needed Notes: ` Nutrition Nursing Diagnoses: Potential for alteratiion in Nutrition/Potential for imbalanced nutrition Goals: Patient/caregiver agrees to and verbalizes understanding of need to use nutritional supplements and/or vitamins as prescribed Date Initiated: 08/18/2016 Target Resolution Date: 10/29/2016 Goal Status: Active Interventions: Assess patient nutrition upon admission and as needed per policy Notes: ` Orientation to the Wound Care Program [place], Charnika ([id number]) Nursing Diagnoses: Knowledge deficit related to the wound healing center program Goals: Patient/caregiver will verbalize understanding of the Wound Healing Center Program Date Initiated: 08/18/2016 Target Resolution Date: 10/29/2016 Goal Status: Active Interventions: Provide education on orientation to the wound center Notes: ` Wound/Skin Impairment Nursing Diagnoses: Impaired tissue integrity Goals: Patient/caregiver will verbalize understanding of skin care regimen Date Initiated: 08/18/2016 Target Resolution Date: 10/29/2016 Goal Status: Active Ulcer/skin breakdown will have a volume reduction of 30% by week 4 Date Initiated: 08/18/2016 Target Resolution Date: 10/29/2016 Goal Status: Active Ulcer/skin breakdown will have a volume reduction of 50% by week 8 Date Initiated: 08/18/2016 Target Resolution Date: 10/29/2016 Goal Status: Active Ulcer/skin breakdown will have a volume reduction of 80% by week 12 Date Initiated: 08/18/2016  Target Resolution Date: 10/29/2016 Goal Status: Active Ulcer/skin breakdown will heal within 14 weeks Date Initiated: 08/18/2016 Target Resolution Date: 10/29/2016 Goal Status: Active Interventions: Assess patient/caregiver ability to obtain necessary supplies Assess patient/caregiver ability to perform ulcer/skin care  regimen upon admission and as needed Assess ulceration(s) every visit Notes: Electronic Signature(s) Signed: 10/13/2016 5:44:09 PM By: Charlotta Newtonorthy, Joanna Panas, Jennylee ([id number]) Entered By: Curtis Sitesorthy, Joanna on 10/13/2016 13:02:44 Gelder, Bayan ([id number]) -------------------------------------------------------------------------------- Pain Assessment Details Patient Name: Ronald LoboHEELY, Kadeja Date of Service: 10/13/2016 12:30 PM Medical Record Patient Account Number: [id number] Number: Treating RN: Curtis SitesDorthy, Joanna 05-17-1935 (80 y.o. Other Clinician: Date of Birth/Sex: Female) Treating ROBSON, [name] Primary Care Jartavious Mckimmy: Duncan Dullullo, Teresa Rolanda Campa/Extender: G Referring Josphine Laffey: Denton Brickullo, Teresa Weeks in Treatment: 8 Active Problems Location of Pain Severity and Description of Pain Patient Has Paino No Site Locations Pain Management and Medication Current Pain Management: Notes Topical or injectable lidocaine is offered to patient for acute pain when surgical debridement is performed. If needed, Patient is instructed to use over the counter pain medication for the following 24-48 hours after debridement. Wound care MDs do not prescribed pain medications. Patient has chronic pain or uncontrolled pain. Patient has been instructed to make an appointment with their Primary Care Physician for pain management. Electronic Signature(s) Signed: 10/13/2016 5:44:09 PM By: Curtis Sitesorthy, Joanna Entered By: Curtis Sitesorthy, Joanna on 10/13/2016 12:44:09 Gilpin, Shronda ([id number]) -------------------------------------------------------------------------------- Patient/Caregiver Education Details Patient Name: Ronald LoboHEELY, Finola Date of Service: 10/13/2016 12:30 PM Medical Record Patient Account Number: [id number] Number: Treating RN: Curtis SitesDorthy, Joanna 05-17-1935 (80 y.o. Other Clinician: Date of Birth/Gender: Female) Treating ROBSON, [name] Primary Care Physician/Extender: Virgil BenedictG Tullo,  Teresa Physician: Tania AdeWeeks in Treatment: 8 Referring Physician: Duncan Dullullo, Teresa Education Assessment Education Provided To: Caregiver SNF nurses via written orders Education Topics Provided Wound/Skin Impairment: Handouts: Other: wound care orders Methods: Printed Electronic Signature(s) Signed: 10/13/2016 5:44:09 PM By: Curtis Sitesorthy, Joanna Entered By: Curtis Sitesorthy, Joanna on 10/13/2016 13:05:07 Matson, Boneta ([id number]) -------------------------------------------------------------------------------- Wound Assessment Details Patient Name: Ronald LoboHEELY, [place] Date of Service: 10/13/2016 12:30 PM Medical Record Patient Account Number: [id number] Number: Treating RN: Curtis SitesDorthy, Joanna 05-17-1935 (80 y.o. Other Clinician: Date of Birth/Sex: Female) Treating ROBSON, [name] Primary Care Khyron Garno: Duncan Dullullo, Teresa Jasdeep Dejarnett/Extender: G Referring Sedale Jenifer: Denton Brickullo, Teresa Weeks in Treatment: 8 Wound Status Wound Number: 1 Primary Pressure Ulcer Etiology: Wound Location: Right Calcaneus Wound Open Wounding Event: Pressure Injury Status: Date Acquired: 07/05/2016 Comorbid Cataracts, Anemia, Congestive Heart Weeks Of Treatment: 8 History: Failure, Hypertension, Type II Diabetes, Clustered Wound: No Gout, Dementia, Neuropathy Photos Photo Uploaded By: Curtis Sitesorthy, Joanna on 10/13/2016 15:06:43 Wound Measurements Length: (cm) 1.1 Width: (cm) 2.7 Depth: (cm) 0.2 Area: (cm) 2.333 Volume: (cm) 0.467 % Reduction in Area: 67% % Reduction in Volume: 67% Epithelialization: None Tunneling: No Undermining: No Wound Description Classification: Category/Stage III Foul Odor Afte Diabetic Severity (Wagner): Grade 1 Due to Product Wound Margin: Flat and Intact Slough/Fibrino Exudate Amount: Large Exudate Type: Serous Exudate Color: amber r Cleansing: Yes Use: No No Wound Bed Granulation Amount: Large (67-100%) Exposed Structure Kitson, Jamine ([id number]) Granulation Quality: Pink Fascia Exposed:  No Necrotic Amount: Small (1-33%) Fat Layer (Subcutaneous Tissue) Exposed: No Necrotic Quality: Eschar, Adherent Slough Tendon Exposed: No Muscle Exposed: No Joint Exposed: No Bone Exposed: No Limited to Skin Breakdown Periwound Skin Texture Texture Color No Abnormalities Noted: No No Abnormalities Noted: No Callus: No Atrophie Blanche: No Crepitus: No Cyanosis: No Excoriation: No Ecchymosis: No Induration: No Erythema: No Rash: No Hemosiderin Staining: No Scarring: No Mottled: No Pallor: No Moisture Rubor: No  No Abnormalities Noted: No Dry / Scaly: No Temperature / Pain Maceration: No Temperature: No Abnormality Tenderness on Palpation: Yes Wound Preparation Ulcer Cleansing: Rinsed/Irrigated with Saline Topical Anesthetic Applied: Other: lidocaine 4%, Treatment Notes Wound #1 (Right Calcaneus) 1. Cleansed with: Clean wound with Normal Saline 2. Anesthetic Topical Lidocaine 4% cream to wound bed prior to debridement 4. Dressing Applied: Santyl Ointment 5. Secondary Dressing Applied Foam Gauze and Kerlix/Conform 7. Secured with Secretary/administrator) Signed: 10/13/2016 5:44:09 PM By: Curtis Sites Entered By: Curtis Sites on 10/13/2016 13:01:19 Hiltunen, Norris ([id number]) -------------------------------------------------------------------------------- Wound Assessment Details Patient Name: [name] Date of Service: 10/13/2016 12:30 PM Medical Record Patient Account Number: [id number] Number: Treating RN: Curtis Sites 21-Sep-1935 (80 y.o. Other Clinician: Date of Birth/Sex: Female) Treating ROBSON, [name] Primary Care Saunders [place]: Duncan Dull Alayla Dethlefs/Extender: G Referring Shanin Szymanowski: Denton Brick in Treatment: 8 Wound Status Wound Number: 2 Primary Pressure Ulcer Etiology: Wound Location: Left Calcaneus Wound Open Wounding Event: Pressure Injury Status: Date Acquired: 07/05/2016 Comorbid Cataracts, Anemia,  Congestive Heart Weeks Of Treatment: 8 History: Failure, Hypertension, Type II Diabetes, Clustered Wound: No Gout, Dementia, Neuropathy Photos Photo Uploaded By: Curtis Sites on 10/13/2016 15:06:44 Wound Measurements Length: (cm) 2.5 Width: (cm) 2.8 Depth: (cm) 0.1 Area: (cm) 5.498 Volume: (cm) 0.55 % Reduction in Area: 50% % Reduction in Volume: 75% Epithelialization: None Tunneling: No Undermining: No Wound Description Classification: Category/Stage III Foul Odor Afte Diabetic Severity (Wagner): Grade 1 Due to Product Wound Margin: Flat and Intact Slough/Fibrino Exudate Amount: Large Exudate Type: Serous Exudate Color: amber r Cleansing: Yes Use: No No Wound Bed Granulation Amount: Large (67-100%) Exposed Structure Jaco, Daesia ([id number]) Granulation Quality: Pink Fascia Exposed: No Necrotic Amount: Small (1-33%) Fat Layer (Subcutaneous Tissue) Exposed: No Necrotic Quality: Eschar, Adherent Slough Tendon Exposed: No Muscle Exposed: No Joint Exposed: No Bone Exposed: No Limited to Skin Breakdown Periwound Skin Texture Texture Color No Abnormalities Noted: No No Abnormalities Noted: No Callus: No Atrophie Blanche: No Crepitus: No Cyanosis: No Excoriation: No Ecchymosis: No Induration: No Erythema: No Rash: No Hemosiderin Staining: No Scarring: No Mottled: No Pallor: No Moisture Rubor: No No Abnormalities Noted: No Dry / Scaly: No Temperature / Pain Maceration: No Temperature: No Abnormality Tenderness on Palpation: Yes Wound Preparation Ulcer Cleansing: Rinsed/Irrigated with Saline Topical Anesthetic Applied: Other: lidocaine 4%, Treatment Notes Wound #2 (Left Calcaneus) 1. Cleansed with: Clean wound with Normal Saline 2. Anesthetic Topical Lidocaine 4% cream to wound bed prior to debridement 4. Dressing Applied: Santyl Ointment 5. Secondary Dressing Applied Foam Gauze and Kerlix/Conform 7. Secured with Music therapist) Signed: 10/13/2016 5:44:09 PM By: Curtis Sites Entered By: Curtis Sites on 10/13/2016 13:02:01 Campisi, Tiann ([id number]) -------------------------------------------------------------------------------- Vitals Details Patient Name: [name] Date of Service: 10/13/2016 12:30 PM Medical Record Patient Account Number: [id number] Number: Treating RN: Curtis Sites 1935-12-05 (80 y.o. Other Clinician: Date of Birth/Sex: Female) Treating ROBSON, [name] Primary Care Murtaza Shell: Duncan Dull Daniell Paradise/Extender: G Referring Safiyyah Vasconez: Denton Brick in Treatment: 8 Vital Signs Time Taken: 12:44 Temperature (F): 98.2 Height (in): 66 Pulse (bpm): 64 Weight (lbs): 140.8 Respiratory Rate (breaths/min): 18 Body Mass Index (BMI): 22.7 Blood Pressure (mmHg): 135/44 Reference Range: 80 - 120 mg / dl Electronic Signature(s) Signed: 10/13/2016 5:44:09 PM By: Curtis Sites Entered By: Curtis Sites on 10/13/2016 12:44:25

## 2016-10-15 NOTE — Progress Notes (Signed)
Cathren, Sween Linsey ([id number]) Visit Report for 10/13/2016 Chief Complaint Document Details Patient Name: [name], [name] Date of Service: 10/13/2016 12:30 PM Medical Record Patient Account Number: [id number] Number: Treating RN: Curtis Sites [birth year] (81 y.o. Other Clinician: Date of Birth/Sex: Female) Treating Jaree Dwight Primary Care Provider: Duncan Dull Provider/Extender: G Referring Provider: Denton Brick in Treatment: 8 Information Obtained from: Patient Chief Complaint 08/18/16; patient is here for review of bilateral heel ulcers Electronic Signature(s) Signed: 10/13/2016 5:43:18 PM By: Baltazar Najjar MD Entered By: Baltazar Najjar on 10/13/2016 13:36:48 Ramthun, Addilyne ([id number]) -------------------------------------------------------------------------------- HPI Details Patient Name: [name] Date of Service: 10/13/2016 12:30 PM Medical Record Patient Account Number: [id number] Number: Treating RN: Curtis Sites January 29, 1976 (81 y.o. Other Clinician: Date of Birth/Sex: Female) Treating Deseree Zemaitis Primary Care Provider: Duncan Dull Provider/Extender: G Referring Provider: Denton Brick in Treatment: 8 History of Present Illness HPI Description: 08/18/16; this is a 81 year old woman who is a type II diabetic not currently on any treatment. She is also listed as having Alzheimer's disease hypertension. She has a history of chronic venous insufficiency with leg wounds in the past but no history of wounds in her feet. In terms of her diabetes at one point she was on insulin however she is no longer on any current treatment, her daughter who is providing most of the history is unaware of her hemoglobin A1c. She has stage IV chronic renal failure and follows with nephrology. The history provided by her daughter is that she has had a wound on the left heel perhaps dating back to last May/17. At that point she was in the  hospital predominantly with psychiatric issues and was [facility]. This was treated with some form of foam dressing and may have actually healed however she was readmitted to hospital in January with Escherichia coli sepsis felt to be secondary to a UTI. [name] Apparently this timeframe both the left heel and right heel reopened or at least the daughter became aware that they were both open. The exact timeframe isn't really certain Her ABIs in this clinic were 1.08 on the right 1.25 on the left. Looking through [employer] doesn't really provide any useful information. She did have venous ultrasounds in 2016 that did not show a DVT. I don't see a recent hemoglobin A1c 08/25/16; from last week we have been using Santyl to both heels. Apparently the nursing home didn't get an x-ray of both heels [[name]] and the daughter states that there was "no injury to bone" but for some reason we haven't been able to get the official report from them. 09/01/16; continued improvement in both wounds on her bilateral heels using Santyl. X-ray report was negative for osteomyelitis 09/08/16; [name]. Using Santyl on both her heel wounds which are fortunately not on the plantar surface. 09/15/16; [facility] is [name]. She has been using Santyl on both her pressure areas which were stage III. Fortunately these or not on the plantar surface. 09/22/16; patient arrives today with a much less viable looking surface on her heels. We had been using Santyl with good improvement however unchanged either fair last week. This may be unrelated however she required debridement today. 09/29/16; patient arrives with a better looking surface on the left heel unfortunately this is a more substantial wound. The area on the right lateral calcaneus again covered in a nonviable necrotic surface. We  have  been making good progress with Santyl. I think I'm going to need to go back to a debriding agent. 10/06/16; still nonviable surface material over the right greater than the left heel. Apparently the facility where this woman resides did not have Iodoflex so rather than calling the clinic they decided to make some cocktail of material and then settled on Hydrofera Blue. Patient is really generally made pretty good progress versus when she came in her first however still has too much nonviable surface especially on the [name], Ashlynd ([id number]) right. 10/13/16; she arrives today with Iodoflex on both heels. This was the dressing that we couldn't seem to get last week. Her daughter tells [place] that [place] last [place] she actually use Santyl. Both wounds are in better condition today and didn't seem to require debridement however I'm not exactly sure what is been most helpful Electronic Signature(s) Signed: 10/13/2016 5:43:18 PM By: Baltazar Najjarobson, Telecia Larocque MD Entered By: Baltazar Najjarobson, Leanthony Rhett on 10/13/2016 13:38:51 Stordahl, Allien ([id number]) -------------------------------------------------------------------------------- Physical Exam Details Patient Name: [name] LoboHEELY, Carlyon Date of Service: 10/13/2016 12:30 PM Medical Record Patient Account Number: [id number] Number: Treating RN: Curtis SitesDorthy, Joanna 1975-08-01 (81 y.o. Other Clinician: Date of Birth/Sex: Female) Treating Hilary Pundt Primary Care Provider: Duncan Dullullo, Teresa Provider/Extender: G Referring Provider: Denton Brickullo, Teresa Weeks in Treatment: 8 Constitutional Sitting or standing Blood Pressure is within target range for patient.. Pulse regular and within target range for patient.Marland Kitchen. Respirations regular, non-labored and within target range.. Temperature is normal and within the target range for the patient.Marland Kitchen. appears in no distress. Eyes Conjunctivae clear. No discharge. Respiratory Respiratory effort is easy and  symmetric bilaterally. Rate is normal at rest and on room air.. Cardiovascular Pedal pulses palpable and strong bilaterally.. Lymphatic None palpable in the popliteal or inguinal area. Psychiatric No evidence of depression, anxiety, or agitation. Calm, cooperative, and communicative. Appropriate interactions and affect.. Notes Wound exam bilateral heels which are both stage III pressure ulcers. Both wounds look fairly healthy including the difficult one on the right. No debridement was felt to be necessary. There is no surrounding infection Electronic Signature(s) Signed: 10/13/2016 5:43:18 PM By: Baltazar Najjarobson, Manon Banbury MD Entered By: Baltazar Najjarobson, Revecca Nachtigal on 10/13/2016 13:41:12 GodleyHEELY, Delita ([id number]) -------------------------------------------------------------------------------- Physician Orders Details Patient Name: [name] LoboHEELY, Faith Date of Service: 10/13/2016 12:30 PM Medical Record Patient Account Number: [id number] Number: Treating RN: Curtis SitesDorthy, Joanna 1975-08-01 (81 y.o. Other Clinician: Date of Birth/Sex: Female) Treating Norton Bivins Primary Care Provider: Duncan Dullullo, Teresa Provider/Extender: G Referring Provider: Denton Brickullo, Teresa Weeks in Treatment: 8 Verbal / Phone Orders: No Diagnosis Coding Wound Cleansing Wound #1 Right Calcaneus o Clean wound with Normal Saline. o May Shower, gently pat wound dry prior to applying new dressing. Wound #2 Left Calcaneus o Clean wound with Normal Saline. o May Shower, gently pat wound dry prior to applying new dressing. Anesthetic Wound #1 Right Calcaneus o Topical Lidocaine 4% cream applied to wound bed prior to debridement Wound #2 Left Calcaneus o Topical Lidocaine 4% cream applied to wound bed prior to debridement Primary Wound Dressing Wound #1 Right Calcaneus o Santyl Ointment Wound #2 Left Calcaneus o Santyl Ointment Secondary Dressing Wound #1 Right Calcaneus o Gauze and Kerlix/Conform o Foam - heel  cups Wound #2 Left Calcaneus o Gauze and Kerlix/Conform o Foam - heel cups Dressing Change Frequency Wound #1 Right Calcaneus Demby, Atonya ([id number]) o Change dressing every day. Wound #2 Left Calcaneus o Change dressing every day. Follow-up Appointments Wound #1 Right Calcaneus o Return Appointment in 1 week.  Wound #2 Left Calcaneus o Return Appointment in 1 week. Edema Control Wound #1 Right Calcaneus o Elevate legs to the level of the heart and pump ankles as often as possible Wound #2 Left Calcaneus o Elevate legs to the level of the heart and pump ankles as often as possible Off-Loading Wound #1 Right Calcaneus o Turn and reposition every 2 hours o Other: - wear heel protector boots at al times in bed, float heels while in bed, Wound #2 Left Calcaneus o Turn and reposition every 2 hours o Other: - wear heel protector boots at al times in bed, float heels while in bed, Additional Orders / Instructions Wound #1 Right Calcaneus o Increase protein intake. o Other: - please add vitamin A, vitamin C and zinc supplements to your diet Wound #2 Left Calcaneus o Increase protein intake. o Other: - please add vitamin A, vitamin C and zinc supplements to your diet Medications-please add to medication list. Wound #1 Right Calcaneus o Santyl Enzymatic Ointment Wound #2 Left Calcaneus o Santyl Enzymatic Ointment Electronic Signature(s) MCKERCHER, Kamiah ([id number]) Signed: 10/13/2016 5:43:18 PM By: Baltazar Najjar MD Signed: 10/13/2016 5:44:09 PM By: Curtis Sites Entered By: Curtis Sites on 10/13/2016 13:05:44 Williard, Kissa ([id number]) -------------------------------------------------------------------------------- Problem List Details Patient Name: [name] Date of Service: 10/13/2016 12:30 PM Medical Record Patient Account Number: [id number] Number: Treating RN: Curtis Sites 02-Aug-1935 (81 y.o. Other Clinician: Date  of Birth/Sex: Female) Treating Kavon Valenza Primary Care Provider: Duncan Dull Provider/Extender: G Referring Provider: Denton Brick in Treatment: 8 Active Problems ICD-10 Encounter Code Description Active Date Diagnosis E11.621 Type 2 diabetes mellitus with foot ulcer 08/18/2016 Yes L89.623 Pressure ulcer of left heel, stage 3 08/18/2016 Yes L89.613 Pressure ulcer of right heel, stage 3 08/18/2016 Yes Inactive Problems Resolved Problems Electronic Signature(s) Signed: 10/13/2016 5:43:18 PM By: Baltazar Najjar MD Entered By: Baltazar Najjar on 10/13/2016 13:36:28 Say, Terryn ([id number]) -------------------------------------------------------------------------------- Progress Note Details Patient Name: [name] Date of Service: 10/13/2016 12:30 PM Medical Record Patient Account Number: [id number] Number: Treating RN: Curtis Sites 1936/02/28 (81 y.o. Other Clinician: Date of Birth/Sex: Female) Treating Miki Labuda Primary Care Provider: Duncan Dull Provider/Extender: G Referring Provider: Denton Brick in Treatment: 8 Subjective Chief Complaint Information obtained from Patient 08/18/16; patient is here for review of bilateral heel ulcers History of Present Illness (HPI) 08/18/16; this is a 81 year old woman who is a type II diabetic not currently on any treatment. She is also listed as having Alzheimer's disease hypertension. She has a history of chronic venous insufficiency with leg wounds in the past but no history of wounds in her feet. In terms of her diabetes at one point she was on insulin however she is no longer on any current treatment, her daughter who is providing most of the history is unaware of her hemoglobin A1c. She has stage IV chronic renal failure and follows with nephrology. The history provided by her daughter is that she has had a wound on the left heel perhaps dating back to last May/17. At that point she was in  the hospital predominantly with psychiatric issues and was [facility]. This was treated with some form of foam dressing and may have actually healed however she was readmitted to hospital in January with Escherichia coli sepsis felt to be secondary to a UTI. [name] Apparently this timeframe both the left heel and right heel reopened or at least the daughter became aware that  they were both open. The exact timeframe isn't really certain Her ABIs in this clinic were 1.08 on the right 1.25 on the left. Looking through [employer] doesn't really provide any useful information. She did have venous ultrasounds in 2016 that did not show a DVT. I don't see a recent hemoglobin A1c 08/25/16; from last week we have been using Santyl to both heels. Apparently the nursing home didn't get an x-ray of both heels [[name]] and the daughter states that there was "no injury to bone" but for some reason we haven't been able to get the official report from them. 09/01/16; continued improvement in both wounds on her bilateral heels using Santyl. X-ray report was negative for osteomyelitis 09/08/16; [name]. Using Santyl on both her heel wounds which are fortunately not on the plantar surface. 09/15/16; [name]. She has been using Santyl on both her pressure areas which were stage III. Fortunately these or not on the plantar surface. 09/22/16; patient arrives today with a much less viable looking surface on her heels. We had been using Santyl with good improvement however unchanged either fair last week. This may be unrelated however she required debridement today. 09/29/16; patient arrives with a better looking surface on the left heel unfortunately this is a more substantial wound. The area on the right lateral calcaneus again covered in a nonviable necrotic surface. We  have Merolla, Enna ([id number]) been making good progress with Santyl. I think I'm going to need to go back to a debriding agent. 10/06/16; still nonviable surface material over the right greater than the left heel. Apparently the facility where this woman resides did not have Iodoflex so rather than calling the clinic they decided to make some cocktail of material and then settled on Hydrofera Blue. Patient is really generally made pretty good progress versus when she came in her first however still has too much nonviable surface especially on the right. 10/13/16; she arrives today with Iodoflex on both heels. This was the dressing that we couldn't seem to get last week. Her daughter tells [place] that where they were traveling to [place] last weekend she actually use Santyl. Both wounds are in better condition today and didn't seem to require debridement however I'm not exactly sure what is been most helpful Objective Constitutional Sitting or standing Blood Pressure is within target range for patient.. Pulse regular and within target range for patient.Marland Kitchen Respirations regular, non-labored and within target range.. Temperature is normal and within the target range for the patient.Marland Kitchen appears in no distress. Vitals Time Taken: 12:44 PM, Height: 66 in, Weight: 140.8 lbs, BMI: 22.7, Temperature: 98.2 F, Pulse: 64 bpm, Respiratory Rate: 18 breaths/min, Blood Pressure: 135/44 mmHg. Eyes Conjunctivae clear. No discharge. Respiratory Respiratory effort is easy and symmetric bilaterally. Rate is normal at rest and on room air.. Cardiovascular Pedal pulses palpable and strong bilaterally.. Lymphatic None palpable in the popliteal or inguinal area. Psychiatric No evidence of depression, anxiety, or agitation. Calm, cooperative, and communicative. Appropriate interactions and affect.. General Notes: Wound exam bilateral heels which are both stage III pressure ulcers. Both wounds look fairly healthy  including the difficult one on the right. No debridement was felt to be necessary. There is no surrounding infection Integumentary (Hair, Skin) Wound #1 status is Open. Original cause of wound was Pressure Injury. The wound is located on the Right Babich, Cyndee ([id number]) Calcaneus. The wound measures 1.1cm length x 2.7cm width x 0.2cm depth;  2.333cm^2 area and 0.467cm^3 volume. The wound is limited to skin breakdown. There is no tunneling or undermining noted. There is a large amount of serous drainage noted. The wound margin is flat and intact. There is large (67- 100%) pink granulation within the wound bed. There is a small (1-33%) amount of necrotic tissue within the wound bed including Eschar and Adherent Slough. The periwound skin appearance did not exhibit: Callus, Crepitus, Excoriation, Induration, Rash, Scarring, Dry/Scaly, Maceration, Atrophie Blanche, Cyanosis, Ecchymosis, Hemosiderin Staining, Mottled, Pallor, Rubor, Erythema. Periwound temperature was noted as No Abnormality. The periwound has tenderness on palpation. Wound #2 status is Open. Original cause of wound was Pressure Injury. The wound is located on the Left Calcaneus. The wound measures 2.5cm length x 2.8cm width x 0.1cm depth; 5.498cm^2 area and 0.55cm^3 volume. The wound is limited to skin breakdown. There is no tunneling or undermining noted. There is a large amount of serous drainage noted. The wound margin is flat and intact. There is large (67- 100%) pink granulation within the wound bed. There is a small (1-33%) amount of necrotic tissue within the wound bed including Eschar and Adherent Slough. The periwound skin appearance did not exhibit: Callus, Crepitus, Excoriation, Induration, Rash, Scarring, Dry/Scaly, Maceration, Atrophie Blanche, Cyanosis, Ecchymosis, Hemosiderin Staining, Mottled, Pallor, Rubor, Erythema. Periwound temperature was noted as No Abnormality. The periwound has tenderness on  palpation. Assessment Active Problems ICD-10 E11.621 - Type 2 diabetes mellitus with foot ulcer L89.623 - Pressure ulcer of left heel, stage 3 L89.613 - Pressure ulcer of right heel, stage 3 Plan Wound Cleansing: Wound #1 Right Calcaneus: Clean wound with Normal Saline. May Shower, gently pat wound dry prior to applying new dressing. Wound #2 Left Calcaneus: Clean wound with Normal Saline. May Shower, gently pat wound dry prior to applying new dressing. Anesthetic: Wound #1 Right Calcaneus: Topical Lidocaine 4% cream applied to wound bed prior to debridement Wound #2 Left Calcaneus: Topical Lidocaine 4% cream applied to wound bed prior to debridement Mast, Shanetha ([id number]) Primary Wound Dressing: Wound #1 Right Calcaneus: Santyl Ointment Wound #2 Left Calcaneus: Santyl Ointment Secondary Dressing: Wound #1 Right Calcaneus: Gauze and Kerlix/Conform Foam - heel cups Wound #2 Left Calcaneus: Gauze and Kerlix/Conform Foam - heel cups Dressing Change Frequency: Wound #1 Right Calcaneus: Change dressing every day. Wound #2 Left Calcaneus: Change dressing every day. Follow-up Appointments: Wound #1 Right Calcaneus: Return Appointment in 1 week. Wound #2 Left Calcaneus: Return Appointment in 1 week. Edema Control: Wound #1 Right Calcaneus: Elevate legs to the level of the heart and pump ankles as often as possible Wound #2 Left Calcaneus: Elevate legs to the level of the heart and pump ankles as often as possible Off-Loading: Wound #1 Right Calcaneus: Turn and reposition every 2 hours Other: - wear heel protector boots at al times in bed, float heels while in bed, Wound #2 Left Calcaneus: Turn and reposition every 2 hours Other: - wear heel protector boots at al times in bed, float heels while in bed, Additional Orders / Instructions: Wound #1 Right Calcaneus: Increase protein intake. Other: - please add vitamin A, vitamin C and zinc supplements to your  diet Wound #2 Left Calcaneus: Increase protein intake. Other: - please add vitamin A, vitamin C and zinc supplements to your diet Medications-please add to medication list.: Wound #1 Right Calcaneus: Santyl Enzymatic Ointment Wound #2 Left Calcaneus: Santyl Enzymatic Ointment Mangham, Myrna ([id number]) #1 I'm going to continue with the Santyl changed daily hopeful to change to either endo-form  or Hydrofera Blue next week #2 no evidence of surrounding infection Electronic Signature(s) Signed: 10/13/2016 5:43:18 PM By: Baltazar Najjar MD Entered By: Baltazar Najjar on 10/13/2016 13:42:16 Kaps, Doloris ([id number]) -------------------------------------------------------------------------------- SuperBill Details Patient Name: [name] Date of Service: 10/13/2016 Medical Record Patient Account Number: [id number] Number: Treating RN: Curtis Sites [birth year] (81 y.o. Other Clinician: Date of Birth/Sex: Female) Treating Nou Chard Primary Care Provider: Duncan Dull Provider/Extender: G Referring Provider: Denton Brick in Treatment: 8 Diagnosis Coding ICD-10 Codes Code Description E11.621 Type 2 diabetes mellitus with foot ulcer L89.623 Pressure ulcer of left heel, stage 3 L89.613 Pressure ulcer of right heel, stage 3 Facility Procedures CPT4 Code: [id number] Description: 99213 - WOUND CARE VISIT-LEV 3 EST PT Modifier: Quantity: 1 Physician Procedures CPT4 Code: [id number] Description: 99213 - WC PHYS LEVEL 3 - EST PT ICD-10 Description Diagnosis E11.621 Type 2 diabetes mellitus with foot ulcer L89.623 Pressure ulcer of left heel, stage 3 L89.613 Pressure ulcer of right heel, stage 3 Modifier: Quantity: 1 Electronic Signature(s) Signed: 10/13/2016 2:13:00 PM By: Curtis Sites Signed: 10/13/2016 5:43:18 PM By: Baltazar Najjar MD Entered By: Curtis Sites on 10/13/2016 14:13:00

## 2016-10-20 ENCOUNTER — Encounter: Payer: Medicare (Managed Care) | Admitting: Internal Medicine

## 2016-10-20 DIAGNOSIS — E11621 Type 2 diabetes mellitus with foot ulcer: Secondary | ICD-10-CM | POA: Diagnosis not present

## 2016-10-22 NOTE — Progress Notes (Signed)
Rhilee, Currin Aiyanah ([id number]) Visit Report for 10/20/2016 Arrival Information Details Patient Name: JAZZLIN, CLEMENTS Date of Service: 10/20/2016 2:30 PM Medical Record Patient Account Number: [id number] Number: Treating RN: Curtis Sites 11-Jul-1935 (80 y.o. Other Clinician: Date of Birth/Sex: Female) Treating ROBSON, [name] Primary Care Virgilene Stryker: Duncan Dull Keylah Darwish/Extender: G Referring Kyson Kupper: Denton Brick in Treatment: 9 Visit Information History Since Last Visit Added or deleted any medications: No Patient Arrived: Walker Any new allergies or adverse reactions: No Arrival Time: 14:42 Had a fall or experienced change in No Accompanied By: dtr activities of daily living that may affect Transfer Assistance: None risk of falls: Patient Requires Transmission-Based No Signs or symptoms of abuse/neglect since last No Precautions: visito Patient Has Alerts: Yes Hospitalized since last visit: No Patient Alerts: DM II Has Dressing in Place as Prescribed: Yes Pain Present Now: No Electronic Signature(s) Signed: 10/20/2016 4:47:45 PM By: Curtis Sites Entered By: Curtis Sites on 10/20/2016 14:43:55 Randa, Shada ([id number]) -------------------------------------------------------------------------------- Clinic Level of Care Assessment Details Patient Name: [name] Date of Service: 10/20/2016 2:30 PM Medical Record Patient Account Number: [id number] Number: Treating RN: Curtis Sites 06-01-1935 (80 y.o. Other Clinician: Date of Birth/Sex: Female) Treating ROBSON, [name] Primary Care Rigel Filsinger: Duncan Dull Audrianna Driskill/Extender: G Referring Mayu Ronk: Denton Brick in Treatment: 9 Clinic Level of Care Assessment Items TOOL 4 Quantity Score []  - Use when only an EandM is performed on FOLLOW-UP visit 0 ASSESSMENTS - Nursing Assessment / Reassessment X - Reassessment of Co-morbidities (includes updates in patient status) 1 10 X -  Reassessment of Adherence to Treatment Plan 1 5 ASSESSMENTS - Wound and Skin Assessment / Reassessment []  - Simple Wound Assessment / Reassessment - one wound 0 X - Complex Wound Assessment / Reassessment - multiple wounds 2 5 []  - Dermatologic / Skin Assessment (not related to wound area) 0 ASSESSMENTS - Focused Assessment []  - Circumferential Edema Measurements - multi extremities 0 []  - Nutritional Assessment / Counseling / Intervention 0 X - Lower Extremity Assessment (monofilament, tuning fork, pulses) 1 5 []  - Peripheral Arterial Disease Assessment (using hand held doppler) 0 ASSESSMENTS - Ostomy and/or Continence Assessment and Care []  - Incontinence Assessment and Management 0 []  - Ostomy Care Assessment and Management (repouching, etc.) 0 PROCESS - Coordination of Care X - Simple Patient / Family Education for ongoing care 1 15 []  - Complex (extensive) Patient / Family Education for ongoing care 0 []  - Staff obtains Chiropractor, Records, Test Results / Process Orders 0 []  - Staff telephones HHA, Nursing Homes / Clarify orders / etc 0 Goette, Debrah ([id number]) []  - Routine Transfer to another Facility (non-emergent condition) 0 []  - Routine Hospital Admission (non-emergent condition) 0 []  - New Admissions / Manufacturing engineer / Ordering NPWT, Apligraf, etc. 0 []  - Emergency Hospital Admission (emergent condition) 0 X - Simple Discharge Coordination 1 10 []  - Complex (extensive) Discharge Coordination 0 PROCESS - Special Needs []  - Pediatric / Minor Patient Management 0 []  - Isolation Patient Management 0 []  - Hearing / Language / Visual special needs 0 []  - Assessment of Community assistance (transportation, D/C planning, etc.) 0 []  - Additional assistance / Altered mentation 0 []  - Support Surface(s) Assessment (bed, cushion, seat, etc.) 0 INTERVENTIONS - Wound Cleansing / Measurement []  - Simple Wound Cleansing - one wound 0 X - Complex Wound Cleansing - multiple  wounds 2 5 X - Wound Imaging (photographs - any number of wounds) 1 5 []  - Wound Tracing (instead of photographs) 0 []  -  Simple Wound Measurement - one wound 0 X - Complex Wound Measurement - multiple wounds 2 5 INTERVENTIONS - Wound Dressings []  - Small Wound Dressing one or multiple wounds 0 X - Medium Wound Dressing one or multiple wounds 2 15 []  - Large Wound Dressing one or multiple wounds 0 []  - Application of Medications - topical 0 []  - Application of Medications - injection 0 Puopolo, Ciarra ([id number]) INTERVENTIONS - Miscellaneous []  - External ear exam 0 []  - Specimen Collection (cultures, biopsies, blood, body fluids, etc.) 0 []  - Specimen(s) / Culture(s) sent or taken to Lab for analysis 0 []  - Patient Transfer (multiple staff / Michiel Sites Lift / Similar devices) 0 []  - Simple Staple / Suture removal (25 or less) 0 []  - Complex Staple / Suture removal (26 or more) 0 []  - Hypo / Hyperglycemic Management (close monitor of Blood Glucose) 0 []  - Ankle / Brachial Index (ABI) - do not check if billed separately 0 X - Vital Signs 1 5 Has the patient been seen at the hospital within the last three years: Yes Total Score: 115 Level Of Care: New/Established - Level 3 Electronic Signature(s) Signed: 10/20/2016 4:47:45 PM By: Curtis Sites Entered By: Curtis Sites on 10/20/2016 15:41:31 Cousineau, Donia ([id number]) -------------------------------------------------------------------------------- Encounter Discharge Information Details Patient Name: [name] Date of Service: 10/20/2016 2:30 PM Medical Record Patient Account Number: [id number] Number: Treating RN: Curtis Sites 1936-01-13 (80 y.o. Other Clinician: Date of Birth/Sex: Female) Treating ROBSON, [name] Primary Care Lacharles Altschuler: Duncan Dull Tarell Schollmeyer/Extender: G Referring Puanani Gene: Denton Brick in Treatment: 9 Encounter Discharge Information Items Discharge Pain Level: 0 Discharge Condition:  Stable Ambulatory Status: Walker Discharge Destination: Nursing Home Transportation: Private Auto Accompanied By: dtr Schedule Follow-up Appointment: Yes Medication Reconciliation completed No and provided to Patient/Care Eddy Termine: Provided on Clinical Summary of Care: 10/20/2016 Form Type Recipient Paper Patient [facility] Electronic Signature(s) Signed: 10/20/2016 3:22:13 PM By: Gwenlyn Perking Entered By: Gwenlyn Perking on 10/20/2016 15:22:13 Brant, Rheanne ([id number]) -------------------------------------------------------------------------------- Lower Extremity Assessment Details Patient Name: [name] Date of Service: 10/20/2016 2:30 PM Medical Record Patient Account Number: [id number] Number: Treating RN: Curtis Sites April 05, 1936 (80 y.o. Other Clinician: Date of Birth/Sex: Female) Treating ROBSON, [name] Primary Care Ralonda Tartt: Duncan Dull Kolyn Rozario/Extender: G Referring Sinthia Karabin: Denton Brick in Treatment: 9 Vascular Assessment Pulses: Dorsalis Pedis Palpable: [Left:Yes] [Right:Yes] Posterior Tibial Extremity colors, hair growth, and conditions: Extremity Color: [Left:Hyperpigmented] [Right:Hyperpigmented] Hair Growth on Extremity: [Left:No] [Right:No] Temperature of Extremity: [Left:Warm] [Right:Warm] Capillary Refill: [Left:< 3 seconds] [Right:< 3 seconds] Electronic Signature(s) Signed: 10/20/2016 4:47:45 PM By: Curtis Sites Entered By: Curtis Sites on 10/20/2016 15:06:58 Pottenger, Sadiya ([id number]) -------------------------------------------------------------------------------- Multi Wound Chart Details Patient Name: [name] Date of Service: 10/20/2016 2:30 PM Medical Record Patient Account Number: [id number] Number: Treating RN: Curtis Sites December 27, 1935 (80 y.o. Other Clinician: Date of Birth/Sex: Female) Treating ROBSON, [name] Primary Care Carolos Fecher: Duncan Dull Zakirah Weingart/Extender: G Referring Traveion Ruddock: Denton Brick in Treatment: 9 Vital Signs Height(in): 66 Pulse(bpm): 69 Weight(lbs): 140.8 Blood Pressure 127/35 (mmHg): Body Mass Index(BMI): 23 Temperature(F): Respiratory Rate 18 (breaths/min): Photos: [1:No Photos] [2:No Photos] [N/A:N/A] Wound Location: [1:Right Calcaneus] [2:Left Calcaneus] [N/A:N/A] Wounding Event: [1:Pressure Injury] [2:Pressure Injury] [N/A:N/A] Primary Etiology: [1:Pressure Ulcer] [2:Pressure Ulcer] [N/A:N/A] Comorbid History: [1:Cataracts, Anemia, Congestive Heart Failure, Congestive Heart Failure, Hypertension, Type II Diabetes, Gout, Dementia, Diabetes, Gout, Dementia, Neuropathy] [2:Cataracts, Anemia, Hypertension, Type II Neuropathy] [N/A:N/A] Date Acquired: [1:07/05/2016] [2:07/05/2016] [N/A:N/A] Weeks of Treatment: [1:9] [2:9] [N/A:N/A] Wound Status: [1:Open] [2:Open] [N/A:N/A] Measurements L x  W x D 1.3x2.7x0.2 [2:2.6x3.2x0.1] [N/A:N/A] (cm) Area (cm) : [1:2.757] [2:6.535] [N/A:N/A] Volume (cm) : [1:0.551] [2:0.653] [N/A:N/A] % Reduction in Area: [1:61.00%] [2:40.60%] [N/A:N/A] % Reduction in Volume: 61.00% [2:70.30%] [N/A:N/A] Classification: [1:Category/Stage III] [2:Category/Stage III] [N/A:N/A] HBO Classification: [1:Grade 1] [2:Grade 1] [N/A:N/A] Exudate Amount: [1:Large] [2:Large] [N/A:N/A] Exudate Type: [1:Serous] [2:Serous] [N/A:N/A] Exudate Color: [1:amber] [2:amber] [N/A:N/A] Foul Odor After [1:Yes] [2:Yes] [N/A:N/A] Cleansing: Odor Anticipated Due to No [2:No] [N/A:N/A] Product Use: Wound Margin: [1:Flat and Intact] [2:Flat and Intact] [N/A:N/A] Granulation Amount: Large (67-100%) Large (67-100%) N/A Granulation Quality: Pink Pink N/A Necrotic Amount: Small (1-33%) Small (1-33%) N/A Necrotic Tissue: Eschar, Adherent Slough Eschar, Adherent Slough N/A Exposed Structures: Fascia: No Fascia: No N/A Fat Layer (Subcutaneous Fat Layer (Subcutaneous Tissue) Exposed: No Tissue) Exposed: No Tendon: No Tendon: No Muscle:  No Muscle: No Joint: No Joint: No Bone: No Bone: No Limited to Skin Limited to Skin Breakdown Breakdown Epithelialization: None None N/A Periwound Skin Texture: Excoriation: No Excoriation: No N/A Induration: No Induration: No Callus: No Callus: No Crepitus: No Crepitus: No Rash: No Rash: No Scarring: No Scarring: No Periwound Skin Maceration: No Maceration: No N/A Moisture: Dry/Scaly: No Dry/Scaly: No Periwound Skin Color: Atrophie Blanche: No Atrophie Blanche: No N/A Cyanosis: No Cyanosis: No Ecchymosis: No Ecchymosis: No Erythema: No Erythema: No Hemosiderin Staining: No Hemosiderin Staining: No Mottled: No Mottled: No Pallor: No Pallor: No Rubor: No Rubor: No Temperature: No Abnormality No Abnormality N/A Tenderness on Yes Yes N/A Palpation: Wound Preparation: Ulcer Cleansing: Ulcer Cleansing: N/A Rinsed/Irrigated with Rinsed/Irrigated with Saline Saline Topical Anesthetic Topical Anesthetic Applied: Other: lidocaine Applied: Other: lidocaine 4% 4% Treatment Notes Wound #1 (Right Calcaneus) 1. Cleansed with: Clean wound with Normal Saline 2. Anesthetic Topical Lidocaine 4% cream to wound bed prior to debridement 4. Dressing Applied: Hale BogusHydrafera Blue Hand, Matelyn ([id number]) 5. Secondary Dressing Applied Foam Gauze and Kerlix/Conform 7. Secured with Tape Wound #2 (Left Calcaneus) 1. Cleansed with: Clean wound with Normal Saline 2. Anesthetic Topical Lidocaine 4% cream to wound bed prior to debridement 4. Dressing Applied: Hydrafera Blue 5. Secondary Dressing Applied Foam Gauze and Kerlix/Conform 7. Secured with Secretary/administratorTape Electronic Signature(s) Signed: 10/20/2016 5:06:34 PM By: Baltazar Najjarobson, [name] MD Entered By: Baltazar Najjarobson, [name] on 10/20/2016 16:38:52 Llerena, Michalle ([id number]) -------------------------------------------------------------------------------- Multi-Disciplinary Care Plan Details Patient Name: Ronald LoboHEELY, Jorita Date of Service:  10/20/2016 2:30 PM Medical Record Patient Account Number: [id number] Number: Treating RN: Curtis Sitesorthy, Joanna 19-Sep-1935 (80 y.o. Other Clinician: Date of Birth/Sex: Female) Treating ROBSON, [name] Primary Care Kenton Fortin: Duncan Dullullo, Teresa Edlin Ford/Extender: G Referring Dijon Cosens: Denton Brickullo, Teresa Weeks in Treatment: 9 Active Inactive ` Abuse / Safety / Falls / Self Care Management Nursing Diagnoses: Impaired physical mobility Potential for falls Goals: Patient will remain injury free Date Initiated: 08/18/2016 Target Resolution Date: 10/29/2016 Goal Status: Active Interventions: Assess fall risk on admission and as needed Notes: ` Nutrition Nursing Diagnoses: Potential for alteratiion in Nutrition/Potential for imbalanced nutrition Goals: Patient/caregiver agrees to and verbalizes understanding of need to use nutritional supplements and/or vitamins as prescribed Date Initiated: 08/18/2016 Target Resolution Date: 10/29/2016 Goal Status: Active Interventions: Assess patient nutrition upon admission and as needed per policy Notes: ` Orientation to the Wound Care Program Mer RougeHEELY, Marvis ([id number]) Nursing Diagnoses: Knowledge deficit related to the wound healing center program Goals: Patient/caregiver will verbalize understanding of the Wound Healing Center Program Date Initiated: 08/18/2016 Target Resolution Date: 10/29/2016 Goal Status: Active Interventions: Provide education on orientation to the wound center Notes: ` Wound/Skin Impairment Nursing Diagnoses: Impaired tissue integrity Goals: Patient/caregiver  will verbalize understanding of skin care regimen Date Initiated: 08/18/2016 Target Resolution Date: 10/29/2016 Goal Status: Active Ulcer/skin breakdown will have a volume reduction of 30% by week 4 Date Initiated: 08/18/2016 Target Resolution Date: 10/29/2016 Goal Status: Active Ulcer/skin breakdown will have a volume reduction of 50% by week 8 Date Initiated:  08/18/2016 Target Resolution Date: 10/29/2016 Goal Status: Active Ulcer/skin breakdown will have a volume reduction of 80% by week 12 Date Initiated: 08/18/2016 Target Resolution Date: 10/29/2016 Goal Status: Active Ulcer/skin breakdown will heal within 14 weeks Date Initiated: 08/18/2016 Target Resolution Date: 10/29/2016 Goal Status: Active Interventions: Assess patient/caregiver ability to obtain necessary supplies Assess patient/caregiver ability to perform ulcer/skin care regimen upon admission and as needed Assess ulceration(s) every visit Notes: Electronic Signature(s) Signed: 10/20/2016 4:47:45 PM By: Charlotta Newton, [place] ([id number]) Entered By: Curtis Sites on 10/20/2016 15:07:13 Gulledge, Che ([id number]) -------------------------------------------------------------------------------- Pain Assessment Details Patient Name: [name] Date of Service: 10/20/2016 2:30 PM Medical Record Patient Account Number: [id number] Number: Treating RN: Curtis Sites November 24, 1935 (80 y.o. Other Clinician: Date of Birth/Sex: Female) Treating ROBSON, [name] Primary Care Zyiah Withington: Duncan Dull Lorrane Mccay/Extender: G Referring Suzan Manon: Denton Brick in Treatment: 9 Active Problems Location of Pain Severity and Description of Pain Patient Has Paino No Site Locations Pain Management and Medication Current Pain Management: Electronic Signature(s) Signed: 10/20/2016 4:47:45 PM By: Curtis Sites Entered By: Curtis Sites on 10/20/2016 14:44:04 Rostron, Marisal ([id number]) -------------------------------------------------------------------------------- Patient/Caregiver Education Details Patient Name: [name] Date of Service: 10/20/2016 2:30 PM Medical Record Patient Account Number: [id number] Number: Treating RN: Curtis Sites 07-01-1935 (80 y.o. Other Clinician: Date of Birth/Gender: Female) Treating ROBSON, [name] Primary Care  Physician/Extender: Virgil Benedict Physician: Tania Ade in Treatment: 9 Referring Physician: Duncan Dull Education Assessment Education Provided To: Caregiver SNF nurses Education Topics Provided Wound/Skin Impairment: Handouts: Other: wound care orders Methods: Clinical cytogeneticist) Signed: 10/20/2016 4:47:45 PM By: Curtis Sites Entered By: Curtis Sites on 10/20/2016 15:08:23 Barretto, Chinelo ([id number]) -------------------------------------------------------------------------------- Wound Assessment Details Patient Name: [name] Date of Service: 10/20/2016 2:30 PM Medical Record Patient Account Number: [id number] Number: Treating RN: Curtis Sites [birth year] (80 y.o. Other Clinician: Date of Birth/Sex: Female) Treating ROBSON, [name] Primary Care Randee Huston: Duncan Dull Naiyana Barbian/Extender: G Referring Malyna Budney: Denton Brick in Treatment: 9 Wound Status Wound Number: 1 Primary Pressure Ulcer Etiology: Wound Location: Right Calcaneus Wound Open Wounding Event: Pressure Injury Status: Date Acquired: 07/05/2016 Comorbid Cataracts, Anemia, Congestive Heart Weeks Of Treatment: 9 History: Failure, Hypertension, Type II Diabetes, Clustered Wound: No Gout, Dementia, Neuropathy Photos Photo Uploaded By: Curtis Sites on 10/20/2016 16:48:26 Wound Measurements Length: (cm) 1.3 Width: (cm) 2.7 Depth: (cm) 0.2 Area: (cm) 2.757 Volume: (cm) 0.551 % Reduction in Area: 61% % Reduction in Volume: 61% Epithelialization: None Tunneling: No Undermining: No Wound Description Classification: Category/Stage III Foul Odor Afte Diabetic Severity (Wagner): Grade 1 Due to Product Wound Margin: Flat and Intact Slough/Fibrino Exudate Amount: Large Exudate Type: Serous Exudate Color: amber r Cleansing: Yes Use: No No Wound Bed Granulation Amount: Large (67-100%) Exposed Structure Zollinger, Heela ([id number]) Granulation Quality:  Pink Fascia Exposed: No Necrotic Amount: Small (1-33%) Fat Layer (Subcutaneous Tissue) Exposed: No Necrotic Quality: Eschar, Adherent Slough Tendon Exposed: No Muscle Exposed: No Joint Exposed: No Bone Exposed: No Limited to Skin Breakdown Periwound Skin Texture Texture Color No Abnormalities Noted: No No Abnormalities Noted: No Callus: No Atrophie Blanche: No Crepitus: No Cyanosis: No Excoriation: No Ecchymosis: No Induration: No Erythema: No Rash: No Hemosiderin Staining: No Scarring: No Mottled:  No Pallor: No Moisture Rubor: No No Abnormalities Noted: No Dry / Scaly: No Temperature / Pain Maceration: No Temperature: No Abnormality Tenderness on Palpation: Yes Wound Preparation Ulcer Cleansing: Rinsed/Irrigated with Saline Topical Anesthetic Applied: Other: lidocaine 4%, Treatment Notes Wound #1 (Right Calcaneus) 1. Cleansed with: Clean wound with Normal Saline 2. Anesthetic Topical Lidocaine 4% cream to wound bed prior to debridement 4. Dressing Applied: Hydrafera Blue 5. Secondary Dressing Applied Foam Gauze and Kerlix/Conform 7. Secured with Secretary/administrator) Signed: 10/20/2016 4:47:45 PM By: Curtis Sites Entered By: Curtis Sites on 10/20/2016 14:51:00 Somarriba, Shawny ([id number]) -------------------------------------------------------------------------------- Wound Assessment Details Patient Name: [name] Date of Service: 10/20/2016 2:30 PM Medical Record Patient Account Number: [id number] Number: Treating RN: Curtis Sites [birth year] (80 y.o. Other Clinician: Date of Birth/Sex: Female) Treating ROBSON, [name] Primary Care Laurabeth Yip: Duncan Dull Zackeriah Kissler/Extender: G Referring Waunetta Riggle: Denton Brick in Treatment: 9 Wound Status Wound Number: 2 Primary Pressure Ulcer Etiology: Wound Location: Left Calcaneus Wound Open Wounding Event: Pressure Injury Status: Date Acquired: 07/05/2016 Comorbid  Cataracts, Anemia, Congestive Heart Weeks Of Treatment: 9 History: Failure, Hypertension, Type II Diabetes, Clustered Wound: No Gout, Dementia, Neuropathy Photos Photo Uploaded By: Curtis Sites on 10/20/2016 16:48:27 Wound Measurements Length: (cm) 2.6 Width: (cm) 3.2 Depth: (cm) 0.1 Area: (cm) 6.535 Volume: (cm) 0.653 % Reduction in Area: 40.6% % Reduction in Volume: 70.3% Epithelialization: None Tunneling: No Undermining: No Wound Description Classification: Category/Stage III Foul Odor Afte Diabetic Severity (Wagner): Grade 1 Due to Product Wound Margin: Flat and Intact Slough/Fibrino Exudate Amount: Large Exudate Type: Serous Exudate Color: amber r Cleansing: Yes Use: No No Wound Bed Granulation Amount: Large (67-100%) Exposed Structure Steffey, Samari ([id number]) Granulation Quality: Pink Fascia Exposed: No Necrotic Amount: Small (1-33%) Fat Layer (Subcutaneous Tissue) Exposed: No Necrotic Quality: Eschar, Adherent Slough Tendon Exposed: No Muscle Exposed: No Joint Exposed: No Bone Exposed: No Limited to Skin Breakdown Periwound Skin Texture Texture Color No Abnormalities Noted: No No Abnormalities Noted: No Callus: No Atrophie Blanche: No Crepitus: No Cyanosis: No Excoriation: No Ecchymosis: No Induration: No Erythema: No Rash: No Hemosiderin Staining: No Scarring: No Mottled: No Pallor: No Moisture Rubor: No No Abnormalities Noted: No Dry / Scaly: No Temperature / Pain Maceration: No Temperature: No Abnormality Tenderness on Palpation: Yes Wound Preparation Ulcer Cleansing: Rinsed/Irrigated with Saline Topical Anesthetic Applied: Other: lidocaine 4%, Treatment Notes Wound #2 (Left Calcaneus) 1. Cleansed with: Clean wound with Normal Saline 2. Anesthetic Topical Lidocaine 4% cream to wound bed prior to debridement 4. Dressing Applied: Hydrafera Blue 5. Secondary Dressing Applied Foam Gauze and Kerlix/Conform 7. Secured  with Secretary/administrator) Signed: 10/20/2016 4:47:45 PM By: Curtis Sites Entered By: Curtis Sites on 10/20/2016 14:51:11 [place], Emmalynne ([id number]) -------------------------------------------------------------------------------- Vitals Details Patient Name: [name] Date of Service: 10/20/2016 2:30 PM Medical Record Patient Account Number: [id number] Number: Treating RN: Curtis Sites 01/13/1936 (80 y.o. Other Clinician: Date of Birth/Sex: Female) Treating ROBSON, [name] Primary Care Jamilah Jean: Duncan Dull Tallen Schnorr/Extender: G Referring Jayden Rudge: Denton Brick in Treatment: 9 Vital Signs Time Taken: 14:46 Pulse (bpm): 69 Height (in): 66 Respiratory Rate (breaths/min): 18 Weight (lbs): 140.8 Blood Pressure (mmHg): 127/35 Body Mass Index (BMI): 22.7 Reference Range: 80 - 120 mg / dl Electronic Signature(s) Signed: 10/20/2016 4:47:45 PM By: Curtis Sites Entered By: Curtis Sites on 10/20/2016 14:46:34

## 2016-10-22 NOTE — Progress Notes (Signed)
[name], Sulser [name] ([id number]) Visit Report for 10/20/2016 Chief Complaint Document Details Patient Name: [name], [name] Date of Service: 10/20/2016 2:30 PM Medical Record Patient Account Number: [id number] Number: Treating RN: Curtis Sites 1935-09-28 (80 y.o. Other Clinician: Date of Birth/Sex: Female) Treating ROBSON, [name] Primary Care Provider: Duncan Dull Provider/Extender: G Referring Provider: Denton Brick in Treatment: 9 Information Obtained from: Patient Chief Complaint 08/18/16; patient is here for review of bilateral heel ulcers Electronic Signature(s) Signed: 10/20/2016 5:06:34 PM By: Baltazar Najjar MD Entered By: Baltazar Najjar on 10/20/2016 16:39:04 [name], [name] ([id number]) -------------------------------------------------------------------------------- HPI Details Patient Name: [name] Date of Service: 10/20/2016 2:30 PM Medical Record Patient Account Number: [id number] Number: Treating RN: Curtis Sites 1936/04/25 (80 y.o. Other Clinician: Date of Birth/Sex: Female) Treating ROBSON, [name] Primary Care Provider: Duncan Dull Provider/Extender: G Referring Provider: Denton Brick in Treatment: 9 History of Present Illness HPI Description: 08/18/16; this is a 81 year old woman who is a type II diabetic not currently on any treatment. She is also listed as having Alzheimer's disease hypertension. She has a history of chronic venous insufficiency with leg wounds in the past but no history of wounds in her feet. In terms of her diabetes at one point she was on insulin however she is no longer on any current treatment, her daughter who is providing most of the history is unaware of her hemoglobin A1c. She has stage IV chronic renal failure and follows with nephrology. The history provided by her daughter is that she has had a wound on the left heel perhaps dating back to last May/17. At that point she was in the hospital  predominantly with psychiatric issues and was [facility]. This was treated with some form of foam dressing and may have actually healed however she was readmitted to hospital in January with Escherichia coli sepsis felt to be secondary to a UTI. Since then she is in [facility] skilled facility. Apparently this timeframe both the left heel and right heel reopened or at least the daughter became aware that they were both open. The exact timeframe isn't really certain Her ABIs in this clinic were 1.08 on the right 1.25 on the left. Looking through [employer] doesn't really provide any useful information. She did have venous ultrasounds in 2016 that did not show a DVT. I don't see a recent hemoglobin A1c 08/25/16; from last week we have been using Santyl to both heels. Apparently the nursing home didn't get an x-ray of both heels [[facility]] and the daughter states that there was "no injury to bone" but for some reason we haven't been able to get the official report from them. 09/01/16; continued improvement in both wounds on her bilateral heels using Santyl. X-ray report was negative for osteomyelitis 09/08/16; patient from [facility] skilled facility. Using Santyl on both her heel wounds which are fortunately not on the plantar surface. 09/15/16; patient is from [facility] skilled facility. She has been using Santyl on both her pressure areas which were stage III. Fortunately these or not on the plantar surface. 09/22/16; patient arrives today with a much less viable looking surface on her heels. We had been using Santyl with good improvement however unchanged either fair last week. This may be unrelated however she required debridement today. 09/29/16; patient arrives with a better looking surface on the left heel unfortunately this is a more substantial wound. The area on the right lateral calcaneus again covered in a nonviable necrotic surface. We have been  making good progress with Santyl. I think I'm going to need to go back to a debriding agent. 10/06/16; still nonviable surface material over the right greater than the left heel. Apparently the facility where this woman resides did not have Iodoflex so rather than calling the clinic they decided to make some cocktail of material and then settled on Hydrofera Blue. Patient is really generally made pretty good progress versus when she came in her first however still has too much nonviable surface especially on the [facility] [place] ([id number]) right. 10/13/16; she arrives today with Iodoflex on both heels. This was the dressing that we couldn't seem to get last week. Her daughter tells [place] that where they were traveling to [place] last weekend she actually use Santyl. Both wounds are in better condition today and didn't seem to require debridement however I'm not exactly sure what is been most helpful 10/20/16; using Santyl to both heels better condition. Nursing reports green drainage Electronic Signature(s) Signed: 10/20/2016 5:06:34 PM By: Baltazar Najjar MD Entered By: Baltazar Najjar on 10/20/2016 16:39:38 [name], [name] ([id number]) -------------------------------------------------------------------------------- Physical Exam Details Patient Name: [name] Date of Service: 10/20/2016 2:30 PM Medical Record Patient Account Number: [id number] Number: Treating RN: Curtis Sites June 12, 1935 (80 y.o. Other Clinician: Date of Birth/Sex: Female) Treating ROBSON, [name] Primary Care Provider: Duncan Dull Provider/Extender: G Referring Provider: Denton Brick in Treatment: 9 Constitutional Sitting or standing Blood Pressure is within target range for patient.. Pulse regular and within target range for patient.Marland Kitchen Respirations regular, non-labored and within target range.. Temperature is normal and within the target range for the patient.Marland Kitchen appears in no  distress. Eyes Conjunctivae clear. No discharge. Respiratory Respiratory effort is easy and symmetric bilaterally. Rate is normal at rest and on room air.. Cardiovascular Pedal pulses palpable and strong bilaterally.. Lymphatic Nonpalpable in the popliteal or inguinal area. Integumentary (Hair, Skin) No evidence of surrounding erythema. Notes Wound exam; bilateral heels which are both stage III pressure ulcers but not on a weight bearing surface. Base of these actually continues to look healthy. No debridement is necessary today. In spite of the green drainage there is no evidence of surrounding erythema or infection. I didn't think antibiotics were necessary Electronic Signature(s) Signed: 10/20/2016 5:06:34 PM By: Baltazar Najjar MD Entered By: Baltazar Najjar on 10/20/2016 16:40:49 [place], Alexias ([id number]) -------------------------------------------------------------------------------- Physician Orders Details Patient Name: [name] Date of Service: 10/20/2016 2:30 PM Medical Record Patient Account Number: [id number] Number: Treating RN: Curtis Sites 1936-02-14 (80 y.o. Other Clinician: Date of Birth/Sex: Female) Treating ROBSON, [name] Primary Care Provider: Duncan Dull Provider/Extender: G Referring Provider: Denton Brick in Treatment: 9 Verbal / Phone Orders: No Diagnosis Coding Wound Cleansing Wound #1 Right Calcaneus o Clean wound with Normal Saline. o May Shower, gently pat wound dry prior to applying new dressing. Wound #2 Left Calcaneus o Clean wound with Normal Saline. o May Shower, gently pat wound dry prior to applying new dressing. Anesthetic Wound #1 Right Calcaneus o Topical Lidocaine 4% cream applied to wound bed prior to debridement Wound #2 Left Calcaneus o Topical Lidocaine 4% cream applied to wound bed prior to debridement Primary Wound Dressing Wound #1 Right Calcaneus o Hydrafera Blue Wound #2 Left  Calcaneus o Hydrafera Blue Secondary Dressing Wound #1 Right Calcaneus o Gauze and Kerlix/Conform o Foam - heel cups Wound #2 Left Calcaneus o Gauze and Kerlix/Conform o Foam - heel cups Dressing Change Frequency Wound #1 Right Calcaneus Lefebre, Chrissa ([id number]) o Change dressing every day. Wound #2  Left Calcaneus o Change dressing every day. Follow-up Appointments Wound #1 Right Calcaneus o Return Appointment in 1 week. Wound #2 Left Calcaneus o Return Appointment in 1 week. Edema Control Wound #1 Right Calcaneus o Elevate legs to the level of the heart and pump ankles as often as possible Wound #2 Left Calcaneus o Elevate legs to the level of the heart and pump ankles as often as possible Off-Loading Wound #1 Right Calcaneus o Turn and reposition every 2 hours o Other: - wear heel protector boots at al times in bed, float heels while in bed, Wound #2 Left Calcaneus o Turn and reposition every 2 hours o Other: - wear heel protector boots at al times in bed, float heels while in bed, Additional Orders / Instructions Wound #1 Right Calcaneus o Increase protein intake. o Other: - please add vitamin A, vitamin C and zinc supplements to your diet Wound #2 Left Calcaneus o Increase protein intake. o Other: - please add vitamin A, vitamin C and zinc supplements to your diet Medications-please add to medication list. Wound #1 Right Calcaneus o Santyl Enzymatic Ointment Wound #2 Left Calcaneus o Santyl Enzymatic Ointment Electronic Signature(s) RussellHEELY, Denaisha ([id number]) Signed: 10/20/2016 4:47:45 PM By: Curtis Sitesorthy, Joanna Signed: 10/20/2016 5:06:34 PM By: Baltazar Najjarobson, [name] MD Entered By: Curtis Sitesorthy, Joanna on 10/20/2016 15:09:11 Marcou, Corisa ([id number]) -------------------------------------------------------------------------------- Problem List Details Patient Name: [name], [name] Date of Service: 10/20/2016 2:30 PM Medical Record  Patient Account Number: [id number] Number: Treating RN: Curtis Sitesorthy, Joanna 09/01/1935 (80 y.o. Other Clinician: Date of Birth/Sex: Female) Treating ROBSON, [name] Primary Care Provider: Duncan Dullullo, Teresa Provider/Extender: G Referring Provider: Denton Brickullo, Teresa Weeks in Treatment: 9 Active Problems ICD-10 Encounter Code Description Active Date Diagnosis E11.621 Type 2 diabetes mellitus with foot ulcer 08/18/2016 Yes L89.623 Pressure ulcer of left heel, stage 3 08/18/2016 Yes L89.613 Pressure ulcer of right heel, stage 3 08/18/2016 Yes Inactive Problems Resolved Problems Electronic Signature(s) Signed: 10/20/2016 5:06:34 PM By: Baltazar Najjarobson, [name] MD Entered By: Baltazar Najjarobson, [name] on 10/20/2016 16:38:44 Cazier, Jamilette ([id number]) -------------------------------------------------------------------------------- Progress Note Details Patient Name: [name], [name] Date of Service: 10/20/2016 2:30 PM Medical Record Patient Account Number: [id number] Number: Treating RN: Curtis Sitesorthy, Joanna 09/01/1935 (80 y.o. Other Clinician: Date of Birth/Sex: Female) Treating ROBSON, [name] Primary Care Provider: Duncan Dullullo, Teresa Provider/Extender: G Referring Provider: Denton Brickullo, Teresa Weeks in Treatment: 9 Subjective Chief Complaint Information obtained from Patient 08/18/16; patient is here for review of bilateral heel ulcers History of Present Illness (HPI) 08/18/16; this is a 81 year old woman who is a type II diabetic not currently on any treatment. She is also listed as having Alzheimer's disease hypertension. She has a history of chronic venous insufficiency with leg wounds in the past but no history of wounds in her feet. In terms of her diabetes at one point she was on insulin however she is no longer on any current treatment, her daughter who is providing most of the history is unaware of her hemoglobin A1c. She has stage IV chronic renal failure and follows with nephrology. The history  provided by her daughter is that she has had a wound on the left heel perhaps dating back to last May/17. At that point she was in the hospital predominantly with psychiatric issues and was discharged [facility]. This was treated with some form of foam dressing and may have actually healed however she was readmitted to hospital in January with Escherichia coli sepsis felt to be secondary to a UTI. Since then she is in [facility] skilled facility.  Apparently this timeframe both the left heel and right heel reopened or at least the daughter became aware that they were both open. The exact timeframe isn't really certain Her ABIs in this clinic were 1.08 on the right 1.25 on the left. Looking through [employer] doesn't really provide any useful information. She did have venous ultrasounds in 2016 that did not show a DVT. I don't see a recent hemoglobin A1c 08/25/16; from last week we have been using Santyl to both heels. Apparently the nursing home didn't get an x-ray of both heels [[facility]] and the daughter states that there was "no injury to bone" but for some reason we haven't been able to get the official report from them. 09/01/16; continued improvement in both wounds on her bilateral heels using Santyl. X-ray report was negative for osteomyelitis 09/08/16; patient from [facility] skilled facility. Using Santyl on both her heel wounds which are fortunately not on the plantar surface. 09/15/16; patient is from [facility] skilled facility. She has been using Santyl on both her pressure areas which were stage III. Fortunately these or not on the plantar surface. 09/22/16; patient arrives today with a much less viable looking surface on her heels. We had been using Santyl with good improvement however unchanged either fair last week. This may be unrelated however she required debridement today. 09/29/16; patient arrives with a better looking surface on the left heel  unfortunately this is a more substantial wound. The area on the right lateral calcaneus again covered in a nonviable necrotic surface. We have Fly, [place] ([id number]) been making good progress with Santyl. I think I'm going to need to go back to a debriding agent. 10/06/16; still nonviable surface material over the right greater than the left heel. Apparently the facility where this woman resides did not have Iodoflex so rather than calling the clinic they decided to make some cocktail of material and then settled on Hydrofera Blue. Patient is really generally made pretty good progress versus when she came in her first however still has too much nonviable surface especially on the right. 10/13/16; she arrives today with Iodoflex on both heels. This was the dressing that we couldn't seem to get last week. Her daughter tells [place] that where they were traveling to [place] last weekend she actually use Santyl. Both wounds are in better condition today and didn't seem to require debridement however I'm not exactly sure what is been most helpful 10/20/16; using Santyl to both heels better condition. Nursing reports green drainage Objective Constitutional Sitting or standing Blood Pressure is within target range for patient.. Pulse regular and within target range for patient.Marland Kitchen Respirations regular, non-labored and within target range.. Temperature is normal and within the target range for the patient.Marland Kitchen appears in no distress. Vitals Time Taken: 2:46 PM, Height: 66 in, Weight: 140.8 lbs, BMI: 22.7, Pulse: 69 bpm, Respiratory Rate: 18 breaths/min, Blood Pressure: 127/35 mmHg. Eyes Conjunctivae clear. No discharge. Respiratory Respiratory effort is easy and symmetric bilaterally. Rate is normal at rest and on room air.. Cardiovascular Pedal pulses palpable and strong bilaterally.. Lymphatic Nonpalpable in the popliteal or inguinal area. General Notes: Wound exam; bilateral heels which are both stage  III pressure ulcers but not on a weight bearing surface. Base of these actually continues to look healthy. No debridement is necessary today. In spite of the green drainage there is no evidence of surrounding erythema or infection. I didn't think antibiotics were necessary Integumentary (Hair, Skin) Pyeatt, Maryetta ([id number]) No evidence of  surrounding erythema. Wound #1 status is Open. Original cause of wound was Pressure Injury. The wound is located on the Right Calcaneus. The wound measures 1.3cm length x 2.7cm width x 0.2cm depth; 2.757cm^2 area and 0.551cm^3 volume. The wound is limited to skin breakdown. There is no tunneling or undermining noted. There is a large amount of serous drainage noted. The wound margin is flat and intact. There is large (67- 100%) pink granulation within the wound bed. There is a small (1-33%) amount of necrotic tissue within the wound bed including Eschar and Adherent Slough. The periwound skin appearance did not exhibit: Callus, Crepitus, Excoriation, Induration, Rash, Scarring, Dry/Scaly, Maceration, Atrophie Blanche, Cyanosis, Ecchymosis, Hemosiderin Staining, Mottled, Pallor, Rubor, Erythema. Periwound temperature was noted as No Abnormality. The periwound has tenderness on palpation. Wound #2 status is Open. Original cause of wound was Pressure Injury. The wound is located on the Left Calcaneus. The wound measures 2.6cm length x 3.2cm width x 0.1cm depth; 6.535cm^2 area and 0.653cm^3 volume. The wound is limited to skin breakdown. There is no tunneling or undermining noted. There is a large amount of serous drainage noted. The wound margin is flat and intact. There is large (67- 100%) pink granulation within the wound bed. There is a small (1-33%) amount of necrotic tissue within the wound bed including Eschar and Adherent Slough. The periwound skin appearance did not exhibit: Callus, Crepitus, Excoriation, Induration, Rash, Scarring, Dry/Scaly,  Maceration, Atrophie Blanche, Cyanosis, Ecchymosis, Hemosiderin Staining, Mottled, Pallor, Rubor, Erythema. Periwound temperature was noted as No Abnormality. The periwound has tenderness on palpation. Assessment Active Problems ICD-10 E11.621 - Type 2 diabetes mellitus with foot ulcer L89.623 - Pressure ulcer of left heel, stage 3 L89.613 - Pressure ulcer of right heel, stage 3 Plan Wound Cleansing: Wound #1 Right Calcaneus: Clean wound with Normal Saline. May Shower, gently pat wound dry prior to applying new dressing. Wound #2 Left Calcaneus: Clean wound with Normal Saline. May Shower, gently pat wound dry prior to applying new dressing. Anesthetic: Wound #1 Right Calcaneus: Herbert, [place] ([id number]) Topical Lidocaine 4% cream applied to wound bed prior to debridement Wound #2 Left Calcaneus: Topical Lidocaine 4% cream applied to wound bed prior to debridement Primary Wound Dressing: Wound #1 Right Calcaneus: Hydrafera Blue Wound #2 Left Calcaneus: Hydrafera Blue Secondary Dressing: Wound #1 Right Calcaneus: Gauze and Kerlix/Conform Foam - heel cups Wound #2 Left Calcaneus: Gauze and Kerlix/Conform Foam - heel cups Dressing Change Frequency: Wound #1 Right Calcaneus: Change dressing every day. Wound #2 Left Calcaneus: Change dressing every day. Follow-up Appointments: Wound #1 Right Calcaneus: Return Appointment in 1 week. Wound #2 Left Calcaneus: Return Appointment in 1 week. Edema Control: Wound #1 Right Calcaneus: Elevate legs to the level of the heart and pump ankles as often as possible Wound #2 Left Calcaneus: Elevate legs to the level of the heart and pump ankles as often as possible Off-Loading: Wound #1 Right Calcaneus: Turn and reposition every 2 hours Other: - wear heel protector boots at al times in bed, float heels while in bed, Wound #2 Left Calcaneus: Turn and reposition every 2 hours Other: - wear heel protector boots at al times in bed,  float heels while in bed, Additional Orders / Instructions: Wound #1 Right Calcaneus: Increase protein intake. Other: - please add vitamin A, vitamin C and zinc supplements to your diet Wound #2 Left Calcaneus: Increase protein intake. Other: - please add vitamin A, vitamin C and zinc supplements to your diet Medications-please add to medication list.:  Wound #1 Right Calcaneus: Santyl Enzymatic Ointment Wound #2 Left Calcaneus: Santyl Enzymatic Ointment Martinezgarcia, Zohar ([id number]) #1 I changed her back to [facility]. I think the surface of these wounds should be capable of supporting epithelialization although the dimensions were not really down this week #2 no debridement was required #3 although the intake nurse description of the drainage was concerning I saw no evidence of infection and elected not to prescribe antibiotics. Electronic Signature(s) Signed: 10/20/2016 5:06:34 PM By: Baltazar Najjar MD Entered By: Baltazar Najjar on 10/20/2016 16:41:50 Stern, Lainie ([id number]) -------------------------------------------------------------------------------- SuperBill Details Patient Name: [name] Date of Service: 10/20/2016 Medical Record Patient Account Number: [id number] Number: Treating RN: Curtis Sites 1935/11/05 (80 y.o. Other Clinician: Date of Birth/Sex: Female) Treating ROBSON, [name] Primary Care Provider: Duncan Dull Provider/Extender: G Referring Provider: Denton Brick in Treatment: 9 Diagnosis Coding ICD-10 Codes Code Description E11.621 Type 2 diabetes mellitus with foot ulcer L89.623 Pressure ulcer of left heel, stage 3 L89.613 Pressure ulcer of right heel, stage 3 Facility Procedures CPT4 Code: [id number] Description: 99213 - WOUND CARE VISIT-LEV 3 EST PT Modifier: Quantity: 1 Physician Procedures CPT4 Code: [id number] Description: 99213 - WC PHYS LEVEL 3 - EST PT ICD-10 Description Diagnosis E11.621 Type 2 diabetes mellitus  with foot ulcer L89.623 Pressure ulcer of left heel, stage 3 L89.613 Pressure ulcer of right heel, stage 3 Modifier: Quantity: 1 Electronic Signature(s) Signed: 10/20/2016 5:06:34 PM By: Baltazar Najjar MD Entered By: Baltazar Najjar on 10/20/2016 16:42:07

## 2016-10-26 ENCOUNTER — Ambulatory Visit: Payer: Self-pay | Admitting: Internal Medicine

## 2016-11-03 ENCOUNTER — Encounter: Payer: Medicare (Managed Care) | Attending: Physician Assistant | Admitting: Physician Assistant

## 2016-11-03 DIAGNOSIS — L89623 Pressure ulcer of left heel, stage 3: Secondary | ICD-10-CM | POA: Insufficient documentation

## 2016-11-03 DIAGNOSIS — F0280 Dementia in other diseases classified elsewhere without behavioral disturbance: Secondary | ICD-10-CM | POA: Insufficient documentation

## 2016-11-03 DIAGNOSIS — L89613 Pressure ulcer of right heel, stage 3: Secondary | ICD-10-CM | POA: Diagnosis not present

## 2016-11-03 DIAGNOSIS — E11621 Type 2 diabetes mellitus with foot ulcer: Secondary | ICD-10-CM | POA: Insufficient documentation

## 2016-11-03 DIAGNOSIS — I129 Hypertensive chronic kidney disease with stage 1 through stage 4 chronic kidney disease, or unspecified chronic kidney disease: Secondary | ICD-10-CM | POA: Insufficient documentation

## 2016-11-03 DIAGNOSIS — N184 Chronic kidney disease, stage 4 (severe): Secondary | ICD-10-CM | POA: Insufficient documentation

## 2016-11-03 DIAGNOSIS — G309 Alzheimer's disease, unspecified: Secondary | ICD-10-CM | POA: Insufficient documentation

## 2016-11-05 NOTE — Progress Notes (Signed)
[name], [name] Visit Report for 11/03/2016 Arrival Information Details Patient Name: [name], [name] Date of Service: 11/03/2016 2:30 PM Medical Record Number: [id number] Patient Account Number: [id number] Date of Birth/Sex: 1935-10-11 (81 y.o. Female) Treating RN: Curtis Sites Primary Care Deondrae Mcgrail: Duncan Dull Other Clinician: Referring Kennady Zimmerle: Duncan Dull Treating Brandan Glauber/Extender: Linwood Dibbles, HOYT Weeks in Treatment: 11 Visit Information History Since Last Visit Added or deleted any medications: No Patient Arrived: Walker Any new allergies or adverse reactions: No Arrival Time: 14:28 Had a fall or experienced change in No Accompanied By: self activities of daily living that may affect Transfer Assistance: None risk of falls: Patient Identification Verified: Yes Signs or symptoms of abuse/neglect since last No Secondary Verification Process Completed: Yes visito Patient Requires Transmission-Based No Hospitalized since last visit: No Precautions: Has Dressing in Place as Prescribed: Yes Patient Has Alerts: Yes Pain Present Now: No Patient Alerts: DM II Electronic Signature(s) Signed: 11/03/2016 5:40:02 PM By: Curtis Sites Entered By: Curtis Sites on 11/03/2016 14:29:15 [name], [name] ([id number]) -------------------------------------------------------------------------------- Encounter Discharge Information Details Patient Name: [name] Date of Service: 11/03/2016 2:30 PM Medical Record Number: [id number] Patient Account Number: [id number] Date of Birth/Sex: Oct 04, 1935 (81 y.o. Female) Treating RN: Curtis Sites Primary Care Nicle Connole: Duncan Dull Other Clinician: Referring Rhyanna Sorce: Duncan Dull Treating Welma Mccombs/Extender: Linwood Dibbles, HOYT Weeks in Treatment: 11 Encounter Discharge Information Items Discharge Pain Level: 0 Discharge Condition: Stable Ambulatory Status: Walker Discharge Destination: Nursing Home Transportation:  Private Auto Accompanied By: dtr Schedule Follow-up Appointment: Yes Medication Reconciliation completed No and provided to Patient/Care Truitt Cruey: Provided on Clinical Summary of Care: 11/03/2016 Form Type Recipient Paper Patient [facility] Electronic Signature(s) Signed: 11/03/2016 3:27:57 PM By: Gwenlyn Perking Entered By: Gwenlyn Perking on 11/03/2016 15:27:57 [name], [name] ([id number]) -------------------------------------------------------------------------------- Lower Extremity Assessment Details Patient Name: [name] Date of Service: 11/03/2016 2:30 PM Medical Record Number: [id number] Patient Account Number: [id number] Date of Birth/Sex: 1935-12-06 (81 y.o. Female) Treating RN: Curtis Sites Primary Care Fin Hupp: Duncan Dull Other Clinician: Referring Maripat Borba: Duncan Dull Treating Kassi Esteve/Extender: Linwood Dibbles, HOYT Weeks in Treatment: 11 Vascular Assessment Pulses: Dorsalis Pedis Palpable: [Left:Yes] [Right:Yes] Posterior Tibial Extremity colors, hair growth, and conditions: Extremity Color: [Left:Hyperpigmented] [Right:Hyperpigmented] Hair Growth on Extremity: [Left:No] [Right:No] Temperature of Extremity: [Left:Warm] [Right:Warm] Capillary Refill: [Left:< 3 seconds] [Right:< 3 seconds] Electronic Signature(s) Signed: 11/03/2016 5:40:02 PM By: Curtis Sites Entered By: Curtis Sites on 11/03/2016 14:40:01 [name], [name] ([id number]) -------------------------------------------------------------------------------- Multi Wound Chart Details Patient Name: [name] Date of Service: 11/03/2016 2:30 PM Medical Record Number: [id number] Patient Account Number: [id number] Date of Birth/Sex: 1935/12/05 (81 y.o. Female) Treating RN: Curtis Sites Primary Care Ashland Osmer: Duncan Dull Other Clinician: Referring Tannie Koskela: Duncan Dull Treating Fadil Macmaster/Extender: Linwood Dibbles, HOYT Weeks in Treatment: 11 Vital Signs Height(in): 66 Pulse(bpm): 74 Weight(lbs): 140.8  Blood Pressure 120/54 (mmHg): Body Mass Index(BMI): 23 Temperature(F): 97.9 Respiratory Rate 18 (breaths/min): Photos: [1:No Photos] [2:No Photos] [N/A:N/A] Wound Location: [1:Right Calcaneus] [2:Left Calcaneus] [N/A:N/A] Wounding Event: [1:Pressure Injury] [2:Pressure Injury] [N/A:N/A] Primary Etiology: [1:Pressure Ulcer] [2:Pressure Ulcer] [N/A:N/A] Comorbid History: [1:Cataracts, Anemia, Congestive Heart Failure, Congestive Heart Failure, Hypertension, Type II Diabetes, Gout, Dementia, Diabetes, Gout, Dementia, Neuropathy] [2:Cataracts, Anemia, Hypertension, Type II Neuropathy] [N/A:N/A] Date Acquired: [1:07/05/2016] [2:07/05/2016] [N/A:N/A] Weeks of Treatment: [1:11] [2:11] [N/A:N/A] Wound Status: [1:Open] [2:Open] [N/A:N/A] Measurements L x W x D 1.1x2.4x0.2 [2:2x2.6x0.1] [N/A:N/A] (cm) Area (cm) : [1:2.073] [2:4.084] [N/A:N/A] Volume (cm) : [1:0.415] [2:0.408] [N/A:N/A] % Reduction in Area: [1:70.70%] [2:62.90%] [N/A:N/A] % Reduction in Volume: 70.70% [2:81.40%] [N/A:N/A] Classification: [1:Category/Stage III] [2:Category/Stage III] [N/A:N/A] HBO  Classification: [1:Grade 1] [2:Grade 1] [N/A:N/A] Exudate Amount: [1:Large] [2:Large] [N/A:N/A] Exudate Type: [1:Serous] [2:Serous] [N/A:N/A] Exudate Color: [1:amber] [2:amber] [N/A:N/A] Foul Odor After [1:Yes] [2:Yes] [N/A:N/A] Cleansing: Odor Anticipated Due to No [2:No] [N/A:N/A] Product Use: Wound Margin: [1:Flat and Intact] [2:Flat and Intact] [N/A:N/A] Granulation Amount: [1:Large (67-100%)] [2:Large (67-100%)] [N/A:N/A] Granulation Quality: [1:Pink] [2:Pink] [N/A:N/A] Necrotic Amount: Small (1-33%) Small (1-33%) N/A Necrotic Tissue: Eschar, Adherent Slough Eschar, Adherent Slough N/A Exposed Structures: Fascia: No Fascia: No N/A Fat Layer (Subcutaneous Fat Layer (Subcutaneous Tissue) Exposed: No Tissue) Exposed: No Tendon: No Tendon: No Muscle: No Muscle: No Joint: No Joint: No Bone: No Bone: No Limited to  Skin Limited to Skin Breakdown Breakdown Epithelialization: None None N/A Periwound Skin Texture: Excoriation: No Excoriation: No N/A Induration: No Induration: No Callus: No Callus: No Crepitus: No Crepitus: No Rash: No Rash: No Scarring: No Scarring: No Periwound Skin Maceration: No Maceration: No N/A Moisture: Dry/Scaly: No Dry/Scaly: No Periwound Skin Color: Atrophie Blanche: No Atrophie Blanche: No N/A Cyanosis: No Cyanosis: No Ecchymosis: No Ecchymosis: No Erythema: No Erythema: No Hemosiderin Staining: No Hemosiderin Staining: No Mottled: No Mottled: No Pallor: No Pallor: No Rubor: No Rubor: No Temperature: No Abnormality No Abnormality N/A Tenderness on Yes Yes N/A Palpation: Wound Preparation: Ulcer Cleansing: Ulcer Cleansing: N/A Rinsed/Irrigated with Rinsed/Irrigated with Saline Saline Topical Anesthetic Topical Anesthetic Applied: Other: lidocaine Applied: Other: lidocaine 4% 4% Treatment Notes Electronic Signature(s) Signed: 11/03/2016 5:40:02 PM By: Curtis Sitesorthy, Joanna Entered By: Curtis Sitesorthy, Joanna on 11/03/2016 14:40:25 [name], [name] ([id number]) -------------------------------------------------------------------------------- Multi-Disciplinary Care Plan Details Patient Name: [name] LoboHEELY, Jerriann Date of Service: 11/03/2016 2:30 PM Medical Record Number: [id number] Patient Account Number: [id number] Date of Birth/Sex: 05/18/1935 (80 y.o. Female) Treating RN: Curtis Sitesorthy, Joanna Primary Care Gavrielle Streck: Duncan Dullullo, Teresa Other Clinician: Referring Terriona Horlacher: Duncan Dullullo, Teresa Treating Mistey Hoffert/Extender: Linwood DibblesSTONE III, HOYT Weeks in Treatment: 11 Active Inactive ` Abuse / Safety / Falls / Self Care Management Nursing Diagnoses: Impaired physical mobility Potential for falls Goals: Patient will remain injury free Date Initiated: 08/18/2016 Target Resolution Date: 10/29/2016 Goal Status: Active Interventions: Assess fall risk on admission and as  needed Notes: ` Nutrition Nursing Diagnoses: Potential for alteratiion in Nutrition/Potential for imbalanced nutrition Goals: Patient/caregiver agrees to and verbalizes understanding of need to use nutritional supplements and/or vitamins as prescribed Date Initiated: 08/18/2016 Target Resolution Date: 10/29/2016 Goal Status: Active Interventions: Assess patient nutrition upon admission and as needed per policy Notes: ` Orientation to the Wound Care Program Nursing Diagnoses: [name] LoboCHEELY, [name] ([id number]) Knowledge deficit related to the wound healing center program Goals: Patient/caregiver will verbalize understanding of the Wound Healing Center Program Date Initiated: 08/18/2016 Target Resolution Date: 10/29/2016 Goal Status: Active Interventions: Provide education on orientation to the wound center Notes: ` Wound/Skin Impairment Nursing Diagnoses: Impaired tissue integrity Goals: Patient/caregiver will verbalize understanding of skin care regimen Date Initiated: 08/18/2016 Target Resolution Date: 10/29/2016 Goal Status: Active Ulcer/skin breakdown will have a volume reduction of 30% by week 4 Date Initiated: 08/18/2016 Target Resolution Date: 10/29/2016 Goal Status: Active Ulcer/skin breakdown will have a volume reduction of 50% by week 8 Date Initiated: 08/18/2016 Target Resolution Date: 10/29/2016 Goal Status: Active Ulcer/skin breakdown will have a volume reduction of 80% by week 12 Date Initiated: 08/18/2016 Target Resolution Date: 10/29/2016 Goal Status: Active Ulcer/skin breakdown will heal within 14 weeks Date Initiated: 08/18/2016 Target Resolution Date: 10/29/2016 Goal Status: Active Interventions: Assess patient/caregiver ability to obtain necessary supplies Assess patient/caregiver ability to perform ulcer/skin care regimen upon admission and as needed Assess ulceration(s) every visit Notes:  Electronic Signature(s) Signed: 11/03/2016 5:40:02 PM By: Curtis Sites Entered By: Curtis Sites on 11/03/2016 14:40:10 [name], [name] ([id number]) [name], [name] ([id number]) -------------------------------------------------------------------------------- Pain Assessment Details Patient Name: [name] Date of Service: 11/03/2016 2:30 PM Medical Record Number: [id number] Patient Account Number: [id number] Date of Birth/Sex: 01-Dec-1935 (80 y.o. Female) Treating RN: Curtis Sites Primary Care Kalaysia Demonbreun: Duncan Dull Other Clinician: Referring Larrissa Stivers: Duncan Dull Treating Jalayla Chrismer/Extender: Linwood Dibbles, HOYT Weeks in Treatment: 11 Active Problems Location of Pain Severity and Description of Pain Patient Has Paino No Site Locations Pain Management and Medication Current Pain Management: Notes Topical or injectable lidocaine is offered to patient for acute pain when surgical debridement is performed. If needed, Patient is instructed to use over the counter pain medication for the following 24-48 hours after debridement. Wound care MDs do not prescribed pain medications. Patient has chronic pain or uncontrolled pain. Patient has been instructed to make an appointment with their Primary Care Physician for pain management. Electronic Signature(s) Signed: 11/03/2016 5:40:02 PM By: Curtis Sites Entered By: Curtis Sites on 11/03/2016 14:30:43 [name], [name] ([id number]) -------------------------------------------------------------------------------- Patient/Caregiver Education Details Patient Name: [name] Date of Service: 11/03/2016 2:30 PM Medical Record Number: [id number] Patient Account Number: [id number] Date of Birth/Gender: 06-Jan-1936 (80 y.o. Female) Treating RN: Curtis Sites Primary Care Physician: Duncan Dull Other Clinician: Referring Physician: Duncan Dull Treating Physician/Extender: Linwood Dibbles, HOYT Weeks in Treatment: 11 Education Assessment Education Provided To: Patient Education Topics Provided Wound/Skin  Impairment: Handouts: Other: wound care as ordered Methods: Demonstration, Explain/Verbal Responses: State content correctly Electronic Signature(s) Signed: 11/03/2016 5:40:02 PM By: Curtis Sites Entered By: Curtis Sites on 11/03/2016 14:42:41 [name], [name] ([id number]) -------------------------------------------------------------------------------- Wound Assessment Details Patient Name: [name] Date of Service: 11/03/2016 2:30 PM Medical Record Number: [id number] Patient Account Number: [id number] Date of Birth/Sex: 07-Sep-1935 (80 y.o. Female) Treating RN: Curtis Sites Primary Care Edword Cu: Duncan Dull Other Clinician: Referring Lillyrose Reitan: Duncan Dull Treating Devory Mckinzie/Extender: Linwood Dibbles, HOYT Weeks in Treatment: 11 Wound Status Wound Number: 1 Primary Pressure Ulcer Etiology: Wound Location: Right Calcaneus Wound Open Wounding Event: Pressure Injury Status: Date Acquired: 07/05/2016 Comorbid Cataracts, Anemia, Congestive Heart Weeks Of Treatment: 11 History: Failure, Hypertension, Type II Diabetes, Clustered Wound: No Gout, Dementia, Neuropathy Photos Wound Measurements Length: (cm) 1.1 Width: (cm) 2.4 Depth: (cm) 0.2 Area: (cm) 2.073 Volume: (cm) 0.415 % Reduction in Area: 70.7% % Reduction in Volume: 70.7% Epithelialization: None Tunneling: No Undermining: No Wound Description Classification: Category/Stage III Diabetic Severity [address]): Grade 1 Wound Margin: Flat and Intact Exudate Amount: Large Exudate Type: Serous Exudate Color: amber Foul Odor After Cleansing: Yes Due to Product Use: No Slough/Fibrino No Wound Bed Granulation Amount: Large (67-100%) Exposed Structure Granulation Quality: Pink Fascia Exposed: No Necrotic Amount: Small (1-33%) Fat Layer (Subcutaneous Tissue) Exposed: No Necrotic Quality: Eschar, Adherent Slough Tendon Exposed: No [name], [name] ([id number]) Muscle Exposed: No Joint Exposed: No Bone Exposed:  No Limited to Skin Breakdown Periwound Skin Texture Texture Color No Abnormalities Noted: No No Abnormalities Noted: No Callus: No Atrophie Blanche: No Crepitus: No Cyanosis: No Excoriation: No Ecchymosis: No Induration: No Erythema: No Rash: No Hemosiderin Staining: No Scarring: No Mottled: No Pallor: No Moisture Rubor: No No Abnormalities Noted: No Dry / Scaly: No Temperature / Pain Maceration: No Temperature: No Abnormality Tenderness on Palpation: Yes Wound Preparation Ulcer Cleansing: Rinsed/Irrigated with Saline Topical Anesthetic Applied: Other: lidocaine 4%, Treatment Notes Wound #1 (Right Calcaneus) 1. Cleansed with: Clean wound with Normal Saline 2. Anesthetic Topical Lidocaine 4% cream to wound bed prior to  debridement 4. Dressing Applied: Hydrafera Blue 5. Secondary Dressing Applied Foam Gauze and Kerlix/Conform 7. Secured with Tape Notes secured lightly with coban Electronic Signature(s) Signed: 11/03/2016 5:28:01 PM By: Curtis Sites Entered By: Curtis Sites on 11/03/2016 17:28:00 [name], [name] -------------------------------------------------------------------------------- Wound Assessment Details Patient Name: [name] Date of Service: 11/03/2016 2:30 PM Medical Record Number: [id number] Patient Account Number: [id number] Date of Birth/Sex: 1935-09-15 (80 y.o. Female) Treating RN: Curtis Sites Primary Care Brecklyn Galvis: Duncan Dull Other Clinician: Referring Sindy Mccune: Duncan Dull Treating Messina Kosinski/Extender: Linwood Dibbles, HOYT Weeks in Treatment: 11 Wound Status Wound Number: 2 Primary Pressure Ulcer Etiology: Wound Location: Left Calcaneus Wound Open Wounding Event: Pressure Injury Status: Date Acquired: 07/05/2016 Comorbid Cataracts, Anemia, Congestive Heart Weeks Of Treatment: 11 History: Failure, Hypertension, Type II Diabetes, Clustered Wound: No Gout, Dementia, Neuropathy Photos Wound  Measurements Length: (cm) 2 Width: (cm) 2.6 Depth: (cm) 0.1 Area: (cm) 4.084 Volume: (cm) 0.408 % Reduction in Area: 62.9% % Reduction in Volume: 81.4% Epithelialization: None Tunneling: No Undermining: No Wound Description Classification: Category/Stage III Diabetic Severity [address]): Grade 1 Wound Margin: Flat and Intact Exudate Amount: Large Exudate Type: Serous Exudate Color: amber Foul Odor After Cleansing: Yes Due to Product Use: No Slough/Fibrino No Wound Bed Granulation Amount: Large (67-100%) Exposed Structure Granulation Quality: Pink Fascia Exposed: No Necrotic Amount: Small (1-33%) Fat Layer (Subcutaneous Tissue) Exposed: No Necrotic Quality: Eschar, Adherent Slough Tendon Exposed: No Boehler, Demeka ([id number]) Muscle Exposed: No Joint Exposed: No Bone Exposed: No Limited to Skin Breakdown Periwound Skin Texture Texture Color No Abnormalities Noted: No No Abnormalities Noted: No Callus: No Atrophie Blanche: No Crepitus: No Cyanosis: No Excoriation: No Ecchymosis: No Induration: No Erythema: No Rash: No Hemosiderin Staining: No Scarring: No Mottled: No Pallor: No Moisture Rubor: No No Abnormalities Noted: No Dry / Scaly: No Temperature / Pain Maceration: No Temperature: No Abnormality Tenderness on Palpation: Yes Wound Preparation Ulcer Cleansing: Rinsed/Irrigated with Saline Topical Anesthetic Applied: Other: lidocaine 4%, Treatment Notes Wound #2 (Left Calcaneus) 1. Cleansed with: Clean wound with Normal Saline 2. Anesthetic Topical Lidocaine 4% cream to wound bed prior to debridement 4. Dressing Applied: Hydrafera Blue 5. Secondary Dressing Applied Foam Gauze and Kerlix/Conform 7. Secured with Tape Notes secured lightly with coban Electronic Signature(s) Signed: 11/03/2016 5:28:13 PM By: Curtis Sites Entered By: Curtis Sites on 11/03/2016 17:28:13 Diveley, Shajuan  ([id number]) -------------------------------------------------------------------------------- Vitals Details Patient Name: [name] Date of Service: 11/03/2016 2:30 PM Medical Record Number: [id number] Patient Account Number: [id number] Date of Birth/Sex: Jun 19, 1935 (80 y.o. Female) Treating RN: Curtis Sites Primary Care Charyl Minervini: Duncan Dull Other Clinician: Referring Jalyne Brodzinski: Duncan Dull Treating Kitti Mcclish/Extender: Linwood Dibbles, HOYT Weeks in Treatment: 11 Vital Signs Time Taken: 14:30 Temperature (F): 97.9 Height (in): 66 Pulse (bpm): 74 Weight (lbs): 140.8 Respiratory Rate (breaths/min): 18 Body Mass Index (BMI): 22.7 Blood Pressure (mmHg): 120/54 Reference Range: 80 - 120 mg / dl Electronic Signature(s) Signed: 11/03/2016 5:40:02 PM By: Curtis Sites Entered By: Curtis Sites on 11/03/2016 14:31:04

## 2016-11-05 NOTE — Progress Notes (Signed)
[name], [name] ([id number]) Visit Report for 11/03/2016 Chief Complaint Document Details Patient Name: [name], [name] Date of Service: 11/03/2016 2:30 PM Medical Record Number: [id number] Patient Account Number: [id number] Date of Birth/Sex: [birth year] (81 y.o. Female) Treating RN: Curtis Sites Primary Care Provider: Duncan Dull Other Clinician: Referring Provider: Duncan Dull Treating Provider/Extender: Linwood Dibbles, HOYT Weeks in Treatment: 11 Information Obtained from: Patient Chief Complaint 08/18/16; patient is here for review of bilateral heel ulcers Electronic Signature(s) Signed: 11/04/2016 10:10:39 AM By: Lenda Kelp PA-C Entered By: Lenda Kelp on 11/03/2016 16:05:35 [name], [name] ([id number]) -------------------------------------------------------------------------------- Debridement Details Patient Name: [name] Date of Service: 11/03/2016 2:30 PM Medical Record Number: [id number] Patient Account Number: [id number] Date of Birth/Sex: Jun 24, 1935 (80 y.o. Female) Treating RN: Curtis Sites Primary Care Provider: Duncan Dull Other Clinician: Referring Provider: Duncan Dull Treating Provider/Extender: Linwood Dibbles, HOYT Weeks in Treatment: 11 Debridement Performed for Wound #1 Right Calcaneus Assessment: Performed By: Physician STONE III, HOYT E., PA-C Debridement: Debridement Pre-procedure Verification/Time Out Yes - 14:56 Taken: Start Time: 14:56 Pain Control: Lidocaine 4% Topical Solution Level: Skin/Subcutaneous Tissue Total Area Debrided (L x 1.1 (cm) x 2.4 (cm) = 2.64 (cm) W): Tissue and other Viable, Non-Viable, Callus, Fibrin/Slough, Subcutaneous material debrided: Instrument: Curette Bleeding: Minimum Hemostasis Achieved: Pressure End Time: 15:01 Procedural Pain: 0 Post Procedural Pain: 0 Response to Treatment: Procedure was tolerated well Post Debridement Measurements of Total Wound Length: (cm) 1.1 Stage: Category/Stage  III Width: (cm) 2.5 Depth: (cm) 0.2 Volume: (cm) 0.432 Character of Wound/Ulcer Post Improved Debridement: Post Procedure Diagnosis Same as Pre-procedure Electronic Signature(s) Signed: 11/03/2016 5:40:02 PM By: Curtis Sites Signed: 11/04/2016 10:10:39 AM By: Lenda Kelp PA-C Entered By: Curtis Sites on 11/03/2016 15:02:05 [name], [name] ([id number]) -------------------------------------------------------------------------------- Debridement Details Patient Name: [name] Date of Service: 11/03/2016 2:30 PM Medical Record Number: [id number] Patient Account Number: [id number] Date of Birth/Sex: 1935-08-29 (80 y.o. Female) Treating RN: Curtis Sites Primary Care Provider: Duncan Dull Other Clinician: Referring Provider: Duncan Dull Treating Provider/Extender: Linwood Dibbles, HOYT Weeks in Treatment: 11 Debridement Performed for Wound #2 Left Calcaneus Assessment: Performed By: Physician STONE III, HOYT E., PA-C Debridement: Debridement Pre-procedure Verification/Time Out Yes - 15:01 Taken: Start Time: 15:01 Pain Control: Lidocaine 4% Topical Solution Level: Skin/Subcutaneous Tissue Total Area Debrided (L x 2 (cm) x 2.6 (cm) = 5.2 (cm) W): Tissue and other Viable, Non-Viable, Callus, Fibrin/Slough, Subcutaneous material debrided: Instrument: Curette Bleeding: Minimum Hemostasis Achieved: Pressure End Time: 15:04 Procedural Pain: 0 Post Procedural Pain: 0 Response to Treatment: Procedure was tolerated well Post Debridement Measurements of Total Wound Length: (cm) 2 Stage: Category/Stage III Width: (cm) 2.6 Depth: (cm) 0.2 Volume: (cm) 0.817 Character of Wound/Ulcer Post Improved Debridement: Post Procedure Diagnosis Same as Pre-procedure Electronic Signature(s) Signed: 11/03/2016 5:40:02 PM By: Curtis Sites Signed: 11/04/2016 10:10:39 AM By: Lenda Kelp PA-C Entered By: Curtis Sites on 11/03/2016 15:03:48 [name], [name]  ([id number]) -------------------------------------------------------------------------------- HPI Details Patient Name: [name] Date of Service: 11/03/2016 2:30 PM Medical Record Number: [id number] Patient Account Number: [id number] Date of Birth/Sex: March 12, 1936 (80 y.o. Female) Treating RN: Curtis Sites Primary Care Provider: Duncan Dull Other Clinician: Referring Provider: Duncan Dull Treating Provider/Extender: Linwood Dibbles, HOYT Weeks in Treatment: 11 History of Present Illness HPI Description: 08/18/16; this is a 81 year old woman who is a type II diabetic not currently on any treatment. She is also listed as having Alzheimer's disease hypertension. She has a history of chronic venous insufficiency with leg wounds in the past but no history of wounds in  her feet. In terms of her diabetes at one point she was on insulin however she is no longer on any current treatment, her daughter who is providing most of the history is unaware of her hemoglobin A1c. She has stage IV chronic renal failure and follows with nephrology. The history provided by her daughter is that she has had a wound on the left heel perhaps dating back to last May/17. At that point she was in the hospital predominantly with psychiatric issues and was [facility]. This was treated with some form of foam dressing and may have actually healed however she was readmitted to hospital in January with Escherichia coli sepsis felt to be secondary to a UTI. Since then she is in [facility] skilled facility. Apparently this timeframe both the left heel and right heel reopened or at least the daughter became aware that they were both open. The exact timeframe isn't really certain Her ABIs in this clinic were 1.08 on the right 1.25 on the left. Looking through [employer] doesn't really provide any useful information. She did have venous ultrasounds in 2016 that did not show a DVT. I don't see a recent  hemoglobin A1c 08/25/16; from last week we have been using Santyl to both heels. Apparently the nursing home didn't get an x-ray of both heels [[facility]] and the daughter states that there was "no injury to bone" but for some reason we haven't been able to get the official report from them. 09/01/16; continued improvement in both wounds on her bilateral heels using Santyl. X-ray report was negative for osteomyelitis 09/08/16; patient from [facility] skilled facility. Using Santyl on both her heel wounds which are fortunately not on the plantar surface. 09/15/16; patient is from [facility] skilled facility. She has been using Santyl on both her pressure areas which were stage III. Fortunately these or not on the plantar surface. 09/22/16; patient arrives today with a much less viable looking surface on her heels. We had been using Santyl with good improvement however unchanged either fair last week. This may be unrelated however she required debridement today. 09/29/16; patient arrives with a better looking surface on the left heel unfortunately this is a more substantial wound. The area on the right lateral calcaneus again covered in a nonviable necrotic surface. We have been making good progress with Santyl. I think I'm going to need to go back to a debriding agent. 10/06/16; still nonviable surface material over the right greater than the left heel. Apparently the facility where this woman resides did not have Iodoflex so rather than calling the clinic they decided to make some cocktail of material and then settled on Hydrofera Blue. Patient is really generally made pretty good progress versus when she came in her first however still has too much nonviable surface especially on the right. 10/13/16; she arrives today with Iodoflex on both heels. This was the dressing that we couldn't seem to get Connaughton, Skya ([id number]) last week. Her daughter tells [place] that where they were traveling  to [place] last weekend she actually use Santyl. Both wounds are in better condition today and didn't seem to require debridement however I'm not exactly sure what is been most helpful 10/20/16; using Santyl to both heels better condition. Nursing reports green drainage 11/03/16 patient wounds appeared to be doing fairly well on evaluation today she seen along with her daughter in the office today. She does have slough covering the wound unfortunately no evidence of infection. Electronic  Signature(s) Signed: 11/04/2016 10:10:39 AM By: Lenda KelpStone III, Hoyt PA-C Entered By: Lenda KelpStone III, Hoyt on 11/03/2016 16:06:01 CompoHEELY, [name] ([id number]) -------------------------------------------------------------------------------- Physical Exam Details Patient Name: [name] LoboHEELY, Da Date of Service: 11/03/2016 2:30 PM Medical Record Number: [id number] Patient Account Number: [id number] Date of Birth/Sex: 18-Nov-1935 (80 y.o. Female) Treating RN: Curtis Sitesorthy, Joanna Primary Care Provider: Duncan Dullullo, Teresa Other Clinician: Referring Provider: Duncan Dullullo, Teresa Treating Provider/Extender: STONE III, HOYT Weeks in Treatment: 11 Constitutional Well-nourished and well-hydrated in no acute distress. Respiratory normal breathing without difficulty. Psychiatric this patient is able to make decisions and demonstrates good insight into disease process. Alert and Oriented x 3. pleasant and cooperative. Notes Patient's wounds being slough covered did required debridement today which he tolerated well without complication. In fact she did not even experience any pain during the debridement process. Post debridement the wound surface was much improved. Electronic Signature(s) Signed: 11/04/2016 10:10:39 AM By: Lenda KelpStone III, Hoyt PA-C Entered By: Lenda KelpStone III, Hoyt on 11/03/2016 16:06:37 [place] LandingHE[place], Leathie ([id number]) -------------------------------------------------------------------------------- Physician Orders Details Patient Name:  [name] LoboHEELY, Barbarita Date of Service: 11/03/2016 2:30 PM Medical Record Number: [id number] Patient Account Number: [id number] Date of Birth/Sex: 18-Nov-1935 (80 y.o. Female) Treating RN: Curtis Sitesorthy, Joanna Primary Care Provider: Duncan Dullullo, Teresa Other Clinician: Referring Provider: Duncan Dullullo, Teresa Treating Provider/Extender: Linwood DibblesSTONE III, HOYT Weeks in Treatment: 7111 Verbal / Phone Orders: No Diagnosis Coding Wound Cleansing Wound #1 Right Calcaneus o Clean wound with Normal Saline. o May Shower, gently pat wound dry prior to applying new dressing. Wound #2 Left Calcaneus o Clean wound with Normal Saline. o May Shower, gently pat wound dry prior to applying new dressing. Anesthetic Wound #1 Right Calcaneus o Topical Lidocaine 4% cream applied to wound bed prior to debridement Wound #2 Left Calcaneus o Topical Lidocaine 4% cream applied to wound bed prior to debridement Primary Wound Dressing Wound #1 Right Calcaneus o Hydrafera Blue Wound #2 Left Calcaneus o Hydrafera Blue Secondary Dressing Wound #1 Right Calcaneus o Gauze and Kerlix/Conform o Foam - heel cups Wound #2 Left Calcaneus o Gauze and Kerlix/Conform o Foam - heel cups Dressing Change Frequency Wound #1 Right Calcaneus o Change dressing every day. [name], [name] ([id number]) Wound #2 Left Calcaneus o Change dressing every day. Follow-up Appointments Wound #1 Right Calcaneus o Return Appointment in 1 week. Wound #2 Left Calcaneus o Return Appointment in 1 week. Edema Control Wound #1 Right Calcaneus o Elevate legs to the level of the heart and pump ankles as often as possible Wound #2 Left Calcaneus o Elevate legs to the level of the heart and pump ankles as often as possible Off-Loading Wound #1 Right Calcaneus o Turn and reposition every 2 hours o Other: - wear heel protector boots at al times in bed, float heels while in bed, Wound #2 Left Calcaneus o Turn and reposition every 2  hours o Other: - wear heel protector boots at al times in bed, float heels while in bed, Additional Orders / Instructions Wound #1 Right Calcaneus o Increase protein intake. o Other: - please add vitamin A, vitamin C and zinc supplements to your diet Wound #2 Left Calcaneus o Increase protein intake. o Other: - please add vitamin A, vitamin C and zinc supplements to your diet Notes I'm going to recommend that we continue with the Current wound care measures for the next week. We will see her for reevaluation at that point. If anything worsens in the interim patient's daughter will contact us for additional recommendations. Electronic Signature(s) Signed: 11/04/2016 10:10:39 AM By: Linwood DibblesStone III,  Hoyt PA-C Entered By: Lenda Kelp on 11/03/2016 16:07:13 [name], [name] -------------------------------------------------------------------------------- Problem List Details Patient Name: [name], [name] Date of Service: 11/03/2016 2:30 PM Medical Record Number: [id number] Patient Account Number: [id number] Date of Birth/Sex: 04-16-1936 (80 y.o. Female) Treating RN: Curtis Sites Primary Care Provider: Duncan Dull Other Clinician: Referring Provider: Duncan Dull Treating Provider/Extender: Linwood Dibbles, HOYT Weeks in Treatment: 11 Active Problems ICD-10 Encounter Code Description Active Date Diagnosis E11.621 Type 2 diabetes mellitus with foot ulcer 08/18/2016 Yes L89.623 Pressure ulcer of left heel, stage 3 08/18/2016 Yes L89.613 Pressure ulcer of right heel, stage 3 08/18/2016 Yes Inactive Problems Resolved Problems Electronic Signature(s) Signed: 11/04/2016 10:10:39 AM By: Lenda Kelp PA-C Entered By: Lenda Kelp on 11/03/2016 16:05:18 [name], [name] ([id number]) -------------------------------------------------------------------------------- Progress Note Details Patient Name: [name] Date of Service: 11/03/2016 2:30 PM Medical Record Number:  [id number] Patient Account Number: [id number] Date of Birth/Sex: [birth year] (80 y.o. Female) Treating RN: Curtis Sites Primary Care Provider: Duncan Dull Other Clinician: Referring Provider: Duncan Dull Treating Provider/Extender: Linwood Dibbles, HOYT Weeks in Treatment: 11 Subjective Chief Complaint Information obtained from Patient 08/18/16; patient is here for review of bilateral heel ulcers History of Present Illness (HPI) 08/18/16; this is a 81 year old woman who is a type II diabetic not currently on any treatment. She is also listed as having Alzheimer's disease hypertension. She has a history of chronic venous insufficiency with leg wounds in the past but no history of wounds in her feet. In terms of her diabetes at one point she was on insulin however she is no longer on any current treatment, her daughter who is providing most of the history is unaware of her hemoglobin A1c. She has stage IV chronic renal failure and follows with nephrology. The history provided by her daughter is that she has had a wound on the left heel perhaps dating back to last May/17. At that point she was in the hospital predominantly with psychiatric issues and was discharged [facility]. This was treated with some form of foam dressing and may have actually healed however she was readmitted to hospital in January with Escherichia coli sepsis felt to be secondary to a UTI. Since then she is in [facility] skilled facility. Apparently this timeframe both the left heel and right heel reopened or at least the daughter became aware that they were both open. The exact timeframe isn't really certain Her ABIs in this clinic were 1.08 on the right 1.25 on the left. Looking through [employer] doesn't really provide any useful information. She did have venous ultrasounds in 2016 that did not show a DVT. I don't see a recent hemoglobin A1c 08/25/16; from last week we have been using Santyl to both heels.  Apparently the nursing home didn't get an x-ray of both heels [[facility]] and the daughter states that there was "no injury to bone" but for some reason we haven't been able to get the official report from them. 09/01/16; continued improvement in both wounds on her bilateral heels using Santyl. X-ray report was negative for osteomyelitis 09/08/16; patient from [facility] skilled facility. Using Santyl on both her heel wounds which are fortunately not on the plantar surface. 09/15/16; patient is from [facility] skilled facility. She has been using Santyl on both her pressure areas which were stage III. Fortunately these or not on the plantar surface. 09/22/16; patient arrives today with a much less viable looking surface on her heels. We had been using Santyl with  good improvement however unchanged either fair last week. This may be unrelated however she required debridement today. 09/29/16; patient arrives with a better looking surface on the left heel unfortunately this is a more substantial wound. The area on the right lateral calcaneus again covered in a nonviable necrotic surface. We have been making good progress with Santyl. I think I'm going to need to go back to a debriding agent. 10/06/16; still nonviable surface material over the right greater than the left heel. Apparently the facility where [facility] ([id number]) this woman resides did not have Iodoflex so rather than calling the clinic they decided to make some cocktail of material and then settled on Hydrofera Blue. Patient is really generally made pretty good progress versus when she came in her first however still has too much nonviable surface especially on the right. 10/13/16; she arrives today with Iodoflex on both heels. This was the dressing that we couldn't seem to get last week. Her daughter tells [place] that where they were traveling to [place] last weekend she actually use Santyl. Both wounds are in better  condition today and didn't seem to require debridement however I'm not exactly sure what is been most helpful 10/20/16; using Santyl to both heels better condition. Nursing reports green drainage 11/03/16 patient wounds appeared to be doing fairly well on evaluation today she seen along with her daughter in the office today. She does have slough covering the wound unfortunately no evidence of infection. Objective Constitutional Well-nourished and well-hydrated in no acute distress. Vitals Time Taken: 2:30 PM, Height: 66 in, Weight: 140.8 lbs, BMI: 22.7, Temperature: 97.9 F, Pulse: 74 bpm, Respiratory Rate: 18 breaths/min, Blood Pressure: 120/54 mmHg. Respiratory normal breathing without difficulty. Psychiatric this patient is able to make decisions and demonstrates good insight into disease process. Alert and Oriented x 3. pleasant and cooperative. General Notes: Patient's wounds being slough covered did required debridement today which he tolerated well without complication. In fact she did not even experience any pain during the debridement process. Post debridement the wound surface was much improved. Integumentary (Hair, Skin) Wound #1 status is Open. Original cause of wound was Pressure Injury. The wound is located on the Right Calcaneus. The wound measures 1.1cm length x 2.4cm width x 0.2cm depth; 2.073cm^2 area and 0.415cm^3 volume. The wound is limited to skin breakdown. There is no tunneling or undermining noted. There is a large amount of serous drainage noted. The wound margin is flat and intact. There is large (67- 100%) pink granulation within the wound bed. There is a small (1-33%) amount of necrotic tissue within the wound bed including Eschar and Adherent Slough. The periwound skin appearance did not exhibit: Callus, Crepitus, Excoriation, Induration, Rash, Scarring, Dry/Scaly, Maceration, Atrophie Blanche, Cyanosis, Fairley, Vayla ([id number]) Ecchymosis, Hemosiderin  Staining, Mottled, Pallor, Rubor, Erythema. Periwound temperature was noted as No Abnormality. The periwound has tenderness on palpation. Wound #2 status is Open. Original cause of wound was Pressure Injury. The wound is located on the Left Calcaneus. The wound measures 2cm length x 2.6cm width x 0.1cm depth; 4.084cm^2 area and 0.408cm^3 volume. The wound is limited to skin breakdown. There is no tunneling or undermining noted. There is a large amount of serous drainage noted. The wound margin is flat and intact. There is large (67- 100%) pink granulation within the wound bed. There is a small (1-33%) amount of necrotic tissue within the wound bed including Eschar and Adherent Slough. The periwound skin appearance did not exhibit: Callus, Crepitus, Excoriation, Induration,  Rash, Scarring, Dry/Scaly, Maceration, Atrophie Blanche, Cyanosis, Ecchymosis, Hemosiderin Staining, Mottled, Pallor, Rubor, Erythema. Periwound temperature was noted as No Abnormality. The periwound has tenderness on palpation. Assessment Active Problems ICD-10 E11.621 - Type 2 diabetes mellitus with foot ulcer L89.623 - Pressure ulcer of left heel, stage 3 L89.613 - Pressure ulcer of right heel, stage 3 Procedures Wound #1 Pre-procedure diagnosis of Wound #1 is a Pressure Ulcer located on the Right Calcaneus . There was a Skin/Subcutaneous Tissue Debridement ([id number]) debridement with total area of 2.64 sq cm performed by STONE III, HOYT E., PA-C. with the following instrument(s): Curette to remove Viable and Non-Viable tissue/material including Fibrin/Slough, Callus, and Subcutaneous after achieving pain control using Lidocaine 4% Topical Solution. A time out was conducted at 14:56, prior to the start of the procedure. A Minimum amount of bleeding was controlled with Pressure. The procedure was tolerated well with a pain level of 0 throughout and a pain level of 0 following the procedure. Post Debridement  Measurements: 1.1cm length x 2.5cm width x 0.2cm depth; 0.432cm^3 volume. Post debridement Stage noted as Category/Stage III. Character of Wound/Ulcer Post Debridement is improved. Post procedure Diagnosis Wound #1: Same as Pre-Procedure Wound #2 Pre-procedure diagnosis of Wound #2 is a Pressure Ulcer located on the Left Calcaneus . There was a Skin/Subcutaneous Tissue Debridement ([id number]) debridement with total area of 5.2 sq cm performed by STONE III, HOYT E., PA-C. with the following instrument(s): Curette to remove Viable and Non-Viable [name], [name] ([id number]) tissue/material including Fibrin/Slough, Callus, and Subcutaneous after achieving pain control using Lidocaine 4% Topical Solution. A time out was conducted at 15:01, prior to the start of the procedure. A Minimum amount of bleeding was controlled with Pressure. The procedure was tolerated well with a pain level of 0 throughout and a pain level of 0 following the procedure. Post Debridement Measurements: 2cm length x 2.6cm width x 0.2cm depth; 0.817cm^3 volume. Post debridement Stage noted as Category/Stage III. Character of Wound/Ulcer Post Debridement is improved. Post procedure Diagnosis Wound #2: Same as Pre-Procedure Plan Wound Cleansing: Wound #1 Right Calcaneus: Clean wound with Normal Saline. May Shower, gently pat wound dry prior to applying new dressing. Wound #2 Left Calcaneus: Clean wound with Normal Saline. May Shower, gently pat wound dry prior to applying new dressing. Anesthetic: Wound #1 Right Calcaneus: Topical Lidocaine 4% cream applied to wound bed prior to debridement Wound #2 Left Calcaneus: Topical Lidocaine 4% cream applied to wound bed prior to debridement Primary Wound Dressing: Wound #1 Right Calcaneus: Hydrafera Blue Wound #2 Left Calcaneus: Hydrafera Blue Secondary Dressing: Wound #1 Right Calcaneus: Gauze and Kerlix/Conform Foam - heel cups Wound #2 Left Calcaneus: Gauze and  Kerlix/Conform Foam - heel cups Dressing Change Frequency: Wound #1 Right Calcaneus: Change dressing every day. Wound #2 Left Calcaneus: Change dressing every day. Follow-up Appointments: Wound #1 Right Calcaneus: Return Appointment in 1 week. Wound #2 Left Calcaneus: Return Appointment in 1 week. [name], [name] ([id number]) Edema Control: Wound #1 Right Calcaneus: Elevate legs to the level of the heart and pump ankles as often as possible Wound #2 Left Calcaneus: Elevate legs to the level of the heart and pump ankles as often as possible Off-Loading: Wound #1 Right Calcaneus: Turn and reposition every 2 hours Other: - wear heel protector boots at al times in bed, float heels while in bed, Wound #2 Left Calcaneus: Turn and reposition every 2 hours Other: - wear heel protector boots at al times in bed, float heels while in bed, Additional Orders /  Instructions: Wound #1 Right Calcaneus: Increase protein intake. Other: - please add vitamin A, vitamin C and zinc supplements to your diet Wound #2 Left Calcaneus: Increase protein intake. Other: - please add vitamin A, vitamin C and zinc supplements to your diet General Notes: I'm going to recommend that we continue with the Current wound care measures for the next week. We will see her for reevaluation at that point. If anything worsens in the interim patient's daughter will contact us for additional recommendations. Electronic Signature(s) Signed: 11/04/2016 10:10:39 AM By: Lenda Kelp PA-C Entered By: Lenda Kelp on 11/03/2016 16:07:22 [name], [name] -------------------------------------------------------------------------------- SuperBill Details Patient Name: [name] Date of Service: 11/03/2016 Medical Record Number: [id number] Patient Account Number: [id number] Date of Birth/Sex: [birth year] (80 y.o. Female) Treating RN: Curtis Sites Primary Care Provider: Duncan Dull Other  Clinician: Referring Provider: Duncan Dull Treating Provider/Extender: Linwood Dibbles, HOYT Weeks in Treatment: 11 Diagnosis Coding ICD-10 Codes Code Description E11.621 Type 2 diabetes mellitus with foot ulcer L89.623 Pressure ulcer of left heel, stage 3 L89.613 Pressure ulcer of right heel, stage 3 Facility Procedures CPT4 Code: [id number] Description: 11042 - DEB SUBQ TISSUE 20 SQ CM/< ICD-10 Description Diagnosis L89.623 Pressure ulcer of left heel, stage 3 L89.613 Pressure ulcer of right heel, stage 3 Modifier: Quantity: 1 Physician Procedures CPT4 Code: [id number] Description: 11042 - WC PHYS SUBQ TISS 20 SQ CM ICD-10 Description Diagnosis L89.623 Pressure ulcer of left heel, stage 3 L89.613 Pressure ulcer of right heel, stage 3 Modifier: Quantity: 1 Electronic Signature(s) Signed: 11/04/2016 10:10:39 AM By: Lenda Kelp PA-C Entered By: Lenda Kelp on 11/03/2016 16:07:45

## 2016-11-10 ENCOUNTER — Encounter: Payer: Medicare (Managed Care) | Admitting: Internal Medicine

## 2016-11-10 DIAGNOSIS — E11621 Type 2 diabetes mellitus with foot ulcer: Secondary | ICD-10-CM | POA: Diagnosis not present

## 2016-11-11 NOTE — Progress Notes (Signed)
Tomicka, Lover Quintavia ([id number]) Visit Report for 11/10/2016 HPI Details Patient Name: [name], [name] Date of Service: 11/10/2016 2:30 PM Medical Record Patient Account Number: [id number] Number: Treating RN: Phillis Haggis [birth year] (81 y.o. Other Clinician: Date of Birth/Sex: Female) Treating ROBSON, [name] Primary Care Provider: Duncan Dull Provider/Extender: G Referring Provider: Denton Brick in Treatment: 12 History of Present Illness HPI Description: 08/18/16; this is a 81 year old woman who is a type II diabetic not currently on any treatment. She is also listed as having Alzheimer's disease hypertension. She has a history of chronic venous insufficiency with leg wounds in the past but no history of wounds in her feet. In terms of her diabetes at one point she was on insulin however she is no longer on any current treatment, her daughter who is providing most of the history is unaware of her hemoglobin A1c. She has stage IV chronic renal failure and follows with nephrology. The history provided by her daughter is that she has had a wound on the left heel perhaps dating back to last May/17. At that point she was in the hospital predominantly with psychiatric issues and was [facility]. This was treated with some form of foam dressing and may have actually healed however she was readmitted to hospital in January with Escherichia coli sepsis felt to be secondary to a UTI. Since then she is in [facility] skilled facility. Apparently this timeframe both the left heel and right heel reopened or at least the daughter became aware that they were both open. The exact timeframe isn't really certain Her ABIs in this clinic were 1.08 on the right 1.25 on the left. Looking through [employer] doesn't really provide any useful information. She did have venous ultrasounds in 2016 that did not show a DVT. I don't see a recent hemoglobin A1c 08/25/16; from last  week we have been using Santyl to both heels. Apparently the nursing home didn't get an x-ray of both heels [[facility]] and the daughter states that there was "no injury to bone" but for some reason we haven't been able to get the official report from them. 09/01/16; continued improvement in both wounds on her bilateral heels using Santyl. X-ray report was negative for osteomyelitis 09/08/16; patient from [facility] skilled facility. Using Santyl on both her heel wounds which are fortunately not on the plantar surface. 09/15/16; patient is from [facility] skilled facility. She has been using Santyl on both her pressure areas which were stage III. Fortunately these or not on the plantar surface. 09/22/16; patient arrives today with a much less viable looking surface on her heels. We had been using Santyl with good improvement however unchanged either fair last week. This may be unrelated however she required debridement today. 09/29/16; patient arrives with a better looking surface on the left heel unfortunately this is a more substantial wound. The area on the right lateral calcaneus again covered in a nonviable necrotic surface. We have been making good progress with Santyl. I think I'm going to need to go back to a debriding agent. 10/06/16; still nonviable surface material over the right greater than the left heel. Apparently the facility where [facility] [place] ([id number]) this woman resides did not have Iodoflex so rather than calling the clinic they decided to make some cocktail of material and then settled on Hydrofera Blue. Patient is really generally made pretty good progress versus when she came in her first however still has too much nonviable surface especially on the  right. 10/13/16; she arrives today with Iodoflex on both heels. This was the dressing that we couldn't seem to get last week. Her daughter tells [place] that where they were traveling to [place] last weekend she  actually use Santyl. Both wounds are in better condition today and didn't seem to require debridement however I'm not exactly sure what is been most helpful 10/20/16; using Santyl to both heels better condition. Nursing reports green drainage 11/03/16 patient wounds appeared to be doing fairly well on evaluation today she seen along with her daughter in the office today. She does have slough covering the wound unfortunately no evidence of infection. 11/10/16; both wounds appear to be making nice progress. This is on the bilateral heels Electronic Signature(s) Signed: 11/10/2016 4:47:30 PM By: Baltazar Najjar MD Entered By: Baltazar Najjar on 11/10/2016 16:11:21 [place], Shreshta ([id number]) -------------------------------------------------------------------------------- Physical Exam Details Patient Name: [name] Date of Service: 11/10/2016 2:30 PM Medical Record Patient Account Number: [id number] Number: Treating RN: Phillis Haggis [birth year] (81 y.o. Other Clinician: Date of Birth/Sex: Female) Treating ROBSON, [name] Primary Care Provider: Duncan Dull Provider/Extender: G Referring Provider: Denton Brick in Treatment: 12 Constitutional Patient is hypertensive.. Pulse regular and within target range for patient.Marland Kitchen Respirations regular, non-labored and within target range.. Temperature is normal and within the target range for the patient.Marland Kitchen appears in no distress. Eyes Conjunctivae clear. No discharge. Respiratory Respiratory effort is easy and symmetric bilaterally. Rate is normal at rest and on room air.. Cardiovascular Pedal pulses palpable and strong bilaterally.. Lymphatic None palpable in the popliteal or inguinal area. Psychiatric No evidence of depression, anxiety, or agitation. Calm, cooperative, and communicative. Appropriate interactions and affect.. Notes wound exam; both heel wound surfaces appeared healthy. No debridement required. No  surrounding infection Electronic Signature(s) Signed: 11/10/2016 4:47:30 PM By: Baltazar Najjar MD Entered By: Baltazar Najjar on 11/10/2016 16:12:41 [place], Diasha ([id number]) -------------------------------------------------------------------------------- Physician Orders Details Patient Name: [name] Date of Service: 11/10/2016 2:30 PM Medical Record Patient Account Number: [id number] Number: Treating RN: Phillis Haggis Aug 10, 1935 (80 y.o. Other Clinician: Date of Birth/Sex: Female) Treating ROBSON, [name] Primary Care Provider: Duncan Dull Provider/Extender: G Referring Provider: Denton Brick in Treatment: 12 Verbal / Phone Orders: Yes Clinician: Ashok Cordia, Debi Read Back and Verified: Yes Diagnosis Coding Wound Cleansing Wound #1 Right Calcaneus o Clean wound with Normal Saline. o May Shower, gently pat wound dry prior to applying new dressing. Wound #2 Left Calcaneus o Clean wound with Normal Saline. o May Shower, gently pat wound dry prior to applying new dressing. Anesthetic Wound #1 Right Calcaneus o Topical Lidocaine 4% cream applied to wound bed prior to debridement Wound #2 Left Calcaneus o Topical Lidocaine 4% cream applied to wound bed prior to debridement Primary Wound Dressing Wound #1 Right Calcaneus o Hydrafera Blue Wound #2 Left Calcaneus o Hydrafera Blue Secondary Dressing Wound #1 Right Calcaneus o Dry Gauze o Conform/Kerlix o Foam - heel cups o Other - stretch netting #4 (please order for pt...daughter is aware) Wound #2 Left Calcaneus o Dry Gauze o Conform/Kerlix o Foam - heel cups [place], Toshika ([id number]) o Other - stretch netting #4 (please order for pt...daughter is aware) Dressing Change Frequency Wound #1 Right Calcaneus o Change dressing every day. Wound #2 Left Calcaneus o Change dressing every day. Follow-up Appointments Wound #1 Right Calcaneus o Return  Appointment in 2 weeks. Wound #2 Left Calcaneus o Return Appointment in 2 weeks. Edema Control Wound #1 Right Calcaneus o Elevate legs to the level of the  heart and pump ankles as often as possible Wound #2 Left Calcaneus o Elevate legs to the level of the heart and pump ankles as often as possible Off-Loading Wound #1 Right Calcaneus o Turn and reposition every 2 hours o Other: - wear heel protector boots at al times in bed, float heels while in bed, Wound #2 Left Calcaneus o Turn and reposition every 2 hours o Other: - wear heel protector boots at al times in bed, float heels while in bed, Additional Orders / Instructions Wound #1 Right Calcaneus o Increase protein intake. o Other: - please add vitamin A, vitamin C and zinc supplements to your diet Wound #2 Left Calcaneus o Increase protein intake. o Other: - please add vitamin A, vitamin C and zinc supplements to your diet Electronic Signature(s) Signed: 11/10/2016 4:47:30 PM By: Baltazar Najjarobson, [name] MD Signed: 11/10/2016 5:03:57 PM By: Alejandro MullingPinkerton, Debra Entered By: Alejandro MullingPinkerton, Debra on 11/10/2016 14:53:33 CheyenneHEELY, Tonae ([id number]) [place], Charli ([id number]) -------------------------------------------------------------------------------- Problem List Details Patient Name: [name] LoboHEELY, Yaneli Date of Service: 11/10/2016 2:30 PM Medical Record Patient Account Number: [id number] Number: Treating RN: Phillis Haggisinkerton, Debi 20-Jun-1935 (80 y.o. Other Clinician: Date of Birth/Sex: Female) Treating ROBSON, [name] Primary Care Provider: Duncan Dullullo, Teresa Provider/Extender: G Referring Provider: Denton Brickullo, Teresa Weeks in Treatment: 12 Active Problems ICD-10 Encounter Code Description Active Date Diagnosis E11.621 Type 2 diabetes mellitus with foot ulcer 08/18/2016 Yes L89.623 Pressure ulcer of left heel, stage 3 08/18/2016 Yes L89.613 Pressure ulcer of right heel, stage 3 08/18/2016 Yes Inactive Problems Resolved  Problems Electronic Signature(s) Signed: 11/10/2016 4:47:30 PM By: Baltazar Najjarobson, [name] MD Entered By: Baltazar Najjarobson, [name] on 11/10/2016 16:10:13 Rullo, Hollin ([id number]) -------------------------------------------------------------------------------- Progress Note Details Patient Name: [name] LoboHEELY, Raveena Date of Service: 11/10/2016 2:30 PM Medical Record Patient Account Number: [id number] Number: Treating RN: Phillis Haggisinkerton, Debi 20-Jun-1935 (80 y.o. Other Clinician: Date of Birth/Sex: Female) Treating ROBSON, [name] Primary Care Provider: Duncan Dullullo, Teresa Provider/Extender: G Referring Provider: Denton Brickullo, Teresa Weeks in Treatment: 12 Subjective History of Present Illness (HPI) 08/18/16; this is a 81 year old woman who is a type II diabetic not currently on any treatment. She is also listed as having Alzheimer's disease hypertension. She has a history of chronic venous insufficiency with leg wounds in the past but no history of wounds in her feet. In terms of her diabetes at one point she was on insulin however she is no longer on any current treatment, her daughter who is providing most of the history is unaware of her hemoglobin A1c. She has stage IV chronic renal failure and follows with nephrology. The history provided by her daughter is that she has had a wound on the left heel perhaps dating back to last May/17. At that point she was in the hospital predominantly with psychiatric issues and was [facility]. This was treated with some form of foam dressing and may have actually healed however she was readmitted to hospital in January with Escherichia coli sepsis felt to be secondary to a UTI. Since then she is in [facility] skilled facility. Apparently this timeframe both the left heel and right heel reopened or at least the daughter became aware that they were both open. The exact timeframe isn't really certain Her ABIs in this clinic were 1.08 on the right 1.25 on the  left. Looking through [facility] doesn't really provide any useful information. She did have venous ultrasounds in 2016 that did not show a DVT. I don't see a recent hemoglobin A1c 08/25/16; from last week we have been  using Santyl to both heels. Apparently the nursing home didn't get an x-ray of both heels [[facility]] and the daughter states that there was "no injury to bone" but for some reason we haven't been able to get the official report from them. 09/01/16; continued improvement in both wounds on her bilateral heels using Santyl. X-ray report was negative for osteomyelitis 09/08/16; patient from [facility] skilled facility. Using Santyl on both her heel wounds which are fortunately not on the plantar surface. 09/15/16; patient is from [facility] skilled facility. She has been using Santyl on both her pressure areas which were stage III. Fortunately these or not on the plantar surface. 09/22/16; patient arrives today with a much less viable looking surface on her heels. We had been using Santyl with good improvement however unchanged either fair last week. This may be unrelated however she required debridement today. 09/29/16; patient arrives with a better looking surface on the left heel unfortunately this is a more substantial wound. The area on the right lateral calcaneus again covered in a nonviable necrotic surface. We have been making good progress with Santyl. I think I'm going to need to go back to a debriding agent. 10/06/16; still nonviable surface material over the right greater than the left heel. Apparently the facility where this woman resides did not have Iodoflex so rather than calling the clinic they decided to make some cocktail of material and then settled on Hydrofera Blue. Patient is really generally made pretty good progress versus when she came in her first however still has too much nonviable surface especially on the [facility]  ([id number]) right. 10/13/16; she arrives today with Iodoflex on both heels. This was the dressing that we couldn't seem to get last week. Her daughter tells [place] that where they were traveling to [place] last weekend she actually use Santyl. Both wounds are in better condition today and didn't seem to require debridement however I'm not exactly sure what is been most helpful 10/20/16; using Santyl to both heels better condition. Nursing reports green drainage 11/03/16 patient wounds appeared to be doing fairly well on evaluation today she seen along with her daughter in the office today. She does have slough covering the wound unfortunately no evidence of infection. 11/10/16; both wounds appear to be making nice progress. This is on the bilateral heels Objective Constitutional Patient is hypertensive.. Pulse regular and within target range for patient.Marland Kitchen Respirations regular, non-labored and within target range.. Temperature is normal and within the target range for the patient.Marland Kitchen appears in no distress. Vitals Time Taken: 2:23 PM, Height: 66 in, Weight: 140.8 lbs, BMI: 22.7, Temperature: 97.5 F, Pulse: 73 bpm, Respiratory Rate: 18 breaths/min, Blood Pressure: 156/46 mmHg. Eyes Conjunctivae clear. No discharge. Respiratory Respiratory effort is easy and symmetric bilaterally. Rate is normal at rest and on room air.. Cardiovascular Pedal pulses palpable and strong bilaterally.. Lymphatic None palpable in the popliteal or inguinal area. Psychiatric No evidence of depression, anxiety, or agitation. Calm, cooperative, and communicative. Appropriate interactions and affect.. General Notes: wound exam; both heel wound surfaces appeared healthy. No debridement required. No surrounding infection Geiman, Ofelia ([id number]) Integumentary (Hair, Skin) Wound #1 status is Open. Original cause of wound was Pressure Injury. The wound is located on the Right Calcaneus. The wound measures 1cm length x  2.2cm width x 0.2cm depth; 1.728cm^2 area and 0.346cm^3 volume. The wound is limited to skin breakdown. There is no tunneling or undermining noted. There is a large amount of serous drainage noted. The  wound margin is flat and intact. There is large (67- 100%) pink granulation within the wound bed. There is a small (1-33%) amount of necrotic tissue within the wound bed including Eschar and Adherent Slough. The periwound skin appearance did not exhibit: Callus, Crepitus, Excoriation, Induration, Rash, Scarring, Dry/Scaly, Maceration, Atrophie Blanche, Cyanosis, Ecchymosis, Hemosiderin Staining, Mottled, Pallor, Rubor, Erythema. Periwound temperature was noted as No Abnormality. The periwound has tenderness on palpation. Wound #2 status is Open. Original cause of wound was Pressure Injury. The wound is located on the Left Calcaneus. The wound measures 1.4cm length x 2.7cm width x 0.1cm depth; 2.969cm^2 area and 0.297cm^3 volume. The wound is limited to skin breakdown. There is no tunneling or undermining noted. There is a large amount of serous drainage noted. The wound margin is flat and intact. There is large (67- 100%) pink granulation within the wound bed. There is a small (1-33%) amount of necrotic tissue within the wound bed including Eschar and Adherent Slough. The periwound skin appearance did not exhibit: Callus, Crepitus, Excoriation, Induration, Rash, Scarring, Dry/Scaly, Maceration, Atrophie Blanche, Cyanosis, Ecchymosis, Hemosiderin Staining, Mottled, Pallor, Rubor, Erythema. Periwound temperature was noted as No Abnormality. The periwound has tenderness on palpation. Assessment Active Problems ICD-10 E11.621 - Type 2 diabetes mellitus with foot ulcer L89.623 - Pressure ulcer of left heel, stage 3 L89.613 - Pressure ulcer of right heel, stage 3 Plan Wound Cleansing: Wound #1 Right Calcaneus: Clean wound with Normal Saline. May Shower, gently pat wound dry prior to applying new  dressing. Wound #2 Left Calcaneus: Clean wound with Normal Saline. May Shower, gently pat wound dry prior to applying new dressing. Anesthetic: Wound #1 Right Calcaneus: Topical Lidocaine 4% cream applied to wound bed prior to debridement Haug, Meghin ([id number]) Wound #2 Left Calcaneus: Topical Lidocaine 4% cream applied to wound bed prior to debridement Primary Wound Dressing: Wound #1 Right Calcaneus: Hydrafera Blue Wound #2 Left Calcaneus: Hydrafera Blue Secondary Dressing: Wound #1 Right Calcaneus: Dry Gauze Conform/Kerlix Foam - heel cups Other - stretch netting #4 (please order for pt...daughter is aware) Wound #2 Left Calcaneus: Dry Gauze Conform/Kerlix Foam - heel cups Other - stretch netting #4 (please order for pt...daughter is aware) Dressing Change Frequency: Wound #1 Right Calcaneus: Change dressing every day. Wound #2 Left Calcaneus: Change dressing every day. Follow-up Appointments: Wound #1 Right Calcaneus: Return Appointment in 2 weeks. Wound #2 Left Calcaneus: Return Appointment in 2 weeks. Edema Control: Wound #1 Right Calcaneus: Elevate legs to the level of the heart and pump ankles as often as possible Wound #2 Left Calcaneus: Elevate legs to the level of the heart and pump ankles as often as possible Off-Loading: Wound #1 Right Calcaneus: Turn and reposition every 2 hours Other: - wear heel protector boots at al times in bed, float heels while in bed, Wound #2 Left Calcaneus: Turn and reposition every 2 hours Other: - wear heel protector boots at al times in bed, float heels while in bed, Additional Orders / Instructions: Wound #1 Right Calcaneus: Increase protein intake. Other: - please add vitamin A, vitamin C and zinc supplements to your diet Wound #2 Left Calcaneus: Increase protein intake. Other: - please add vitamin A, vitamin C and zinc supplements to your diet Ehmann, Selita ([id number]) 1 continue hydrofera blue,foam Electronic  Signature(s) Signed: 11/10/2016 4:47:30 PM By: Baltazar Najjar MD Entered By: Baltazar Najjar on 11/10/2016 16:13:26 [place], Lorrinda ([id number]) -------------------------------------------------------------------------------- SuperBill Details Patient Name: [name] Date of Service: 11/10/2016 Medical Record Patient Account Number: [id number]  Number: Treating RN: Phillis Haggis 1935/07/16 (80 y.o. Other Clinician: Date of Birth/Sex: Female) Treating ROBSON, [name] Primary Care Provider: Duncan Dull Provider/Extender: G Referring Provider: Denton Brick in Treatment: 12 Diagnosis Coding ICD-10 Codes Code Description E11.621 Type 2 diabetes mellitus with foot ulcer L89.623 Pressure ulcer of left heel, stage 3 L89.613 Pressure ulcer of right heel, stage 3 Facility Procedures CPT4 Code: 16109604 Description: 99213 - WOUND CARE VISIT-LEV 3 EST PT Modifier: Quantity: 1 Physician Procedures CPT4 Code: 5409811 Description: 99213 - WC PHYS LEVEL 3 - EST PT ICD-10 Description Diagnosis L89.623 Pressure ulcer of left heel, stage 3 L89.613 Pressure ulcer of right heel, stage 3 E11.621 Type 2 diabetes mellitus with foot ulcer Modifier: Quantity: 1 Electronic Signature(s) Signed: 11/10/2016 4:47:30 PM By: Baltazar Najjar MD Signed: 11/10/2016 5:03:57 PM By: Alejandro Mulling Entered By: Alejandro Mulling on 11/10/2016 16:33:32

## 2016-11-12 NOTE — Progress Notes (Signed)
[place]ahna, [name] ([id number]) Visit Report for 11/10/2016 Arrival Information Details Patient Name: [name], [name] Date of Service: 11/10/2016 2:30 PM Medical Record Patient Account Number: [id number] Number: Treating RN: [name] Mar 06, 1936 (80 y.o. Other Clinician: Date of Birth/Sex: Female) Treating [name] Primary Care [name]: [name]/Extender: G Referring Salil Raineri: [name] in Treatment: 12 Visit Information History Since Last Visit All ordered tests and consults were completed: No Patient Arrived: Dan Humphreys Added or deleted any medications: No Arrival Time: 14:21 Any new allergies or adverse reactions: No Accompanied By: self Had a fall or experienced change in No Transfer Assistance: [employer] activities of daily living that may affect Patient [name] risk of falls: Patient Identification Verified: Yes Signs or symptoms of abuse/neglect since last No Secondary Verification Process Yes visito Completed: Hospitalized since last visit: No Patient Requires Transmission- No Has Dressing in Place as Prescribed: Yes Based Precautions: Pain Present Now: No Patient Has Alerts: Yes Patient Alerts: DM II Electronic Signature(s) Signed: 11/10/2016 5:03:57 PM By: [name] Entered By: [name] on 11/10/2016 14:21:40 [name] ([id number]) -------------------------------------------------------------------------------- Clinic Level of Care Assessment Details Patient Name: [name] Date of Service: 11/10/2016 2:30 PM Medical Record Patient Account Number: [id number] Number: Treating RN: [name] Jun 03, 1935 (80 y.o. Other Clinician: Date of Birth/Sex: Female) Treating [name] Primary Care [name]: [name]: G Referring [name]: [name] in Treatment: 12 Clinic Level of Care Assessment Items TOOL 4 Quantity Score X - Use when only an EandM is  performed on FOLLOW-UP visit 1 0 ASSESSMENTS - Nursing Assessment / Reassessment X - Reassessment of Co-morbidities (includes updates in patient status) 1 10 X - Reassessment of Adherence to Treatment Plan 1 5 ASSESSMENTS - Wound and Skin Assessment / Reassessment []  - Simple Wound Assessment / Reassessment - one wound 0 X - Complex Wound Assessment / Reassessment - multiple wounds 2 5 []  - Dermatologic / Skin Assessment (not related to wound area) 0 ASSESSMENTS - Focused Assessment []  - Circumferential Edema Measurements - multi extremities 0 []  - Nutritional Assessment / Counseling / Intervention 0 []  - Lower Extremity Assessment (monofilament, tuning fork, pulses) 0 []  - Peripheral Arterial Disease Assessment (using hand held doppler) 0 ASSESSMENTS - Ostomy and/or Continence Assessment and Care []  - Incontinence Assessment and Management 0 []  - Ostomy Care Assessment and Management (repouching, etc.) 0 PROCESS - Coordination of Care []  - Simple Patient / Family Education for ongoing care 0 X - Complex (extensive) Patient / Family Education for ongoing care 1 20 X - Staff obtains Chiropractor, Records, Test Results / Process Orders 1 10 X - Staff telephones HHA, Nursing Homes / Clarify orders / etc 1 10 [name] ([id number]) []  - Routine Transfer to another Facility (non-emergent condition) 0 []  - Routine Hospital Admission (non-emergent condition) 0 []  - New Admissions / Manufacturing engineer / Ordering NPWT, Apligraf, etc. 0 []  - Emergency Hospital Admission (emergent condition) 0 X - Simple Discharge Coordination 1 10 []  - Complex (extensive) Discharge Coordination 0 PROCESS - Special Needs []  - Pediatric / Minor Patient Management 0 []  - Isolation Patient Management 0 []  - Hearing / Language / Visual special needs 0 []  - Assessment of Community assistance (transportation, D/C planning, etc.) 0 []  - Additional assistance / Altered mentation 0 []  - Support Surface(s)  Assessment (bed, cushion, seat, etc.) 0 INTERVENTIONS - Wound Cleansing / Measurement []  - Simple Wound Cleansing - one wound 0 X - Complex Wound Cleansing - multiple wounds 2 5  X - Wound Imaging (photographs - any number of wounds) 1 5 []  - Wound Tracing (instead of photographs) 0 []  - Simple Wound Measurement - one wound 0 X - Complex Wound Measurement - multiple wounds 2 5 INTERVENTIONS - Wound Dressings X - Small Wound Dressing one or multiple wounds 2 10 []  - Medium Wound Dressing one or multiple wounds 0 []  - Large Wound Dressing one or multiple wounds 0 X - Application of Medications - topical 1 5 []  - Application of Medications - injection 0 [name] ([id number]) INTERVENTIONS - Miscellaneous []  - External ear exam 0 []  - Specimen Collection (cultures, biopsies, blood, body fluids, etc.) 0 []  - Specimen(s) / Culture(s) sent or taken to Lab for analysis 0 []  - Patient Transfer (multiple staff / [name] / Similar devices) 0 []  - Simple Staple / Suture removal (25 or less) 0 []  - Complex Staple / Suture removal (26 or more) 0 []  - Hypo / Hyperglycemic Management (close monitor of Blood Glucose) 0 []  - Ankle / Brachial Index (ABI) - do not check if billed separately 0 X - Vital Signs 1 5 Has the patient been seen at the hospital within the last three years: Yes Total Score: 130 Level Of Care: New/Established - Level 4 Electronic Signature(s) Signed: 11/10/2016 5:03:57 PM By: [name] MullingPinkerton, Debra Entered By: [name] MullingPinkerton, Debra on 11/10/2016 16:33:19 [name] ([id number]) -------------------------------------------------------------------------------- Encounter Discharge Information Details Patient Name: [name] [name] Date of Service: 11/10/2016 2:30 PM Medical Record Patient Account Number: [id number] Number: Treating RN: [name] [birth year] (80 y.o. Other Clinician: Date of Birth/Sex: Female) Treating [name] Primary Care Tanveer Brammer: [name] Dullullo,  Teresa Krystalynn Ridgeway/Extender: G Referring Hildagard Sobecki: [name] Brickullo, Teresa Weeks in Treatment: 12 Encounter Discharge Information Items Discharge Pain Level: 0 Discharge Condition: Stable Ambulatory Status: Walker Discharge Destination: Nursing Home Transportation: Private Auto Accompanied By: daughter Schedule Follow-up Appointment: Yes Medication Reconciliation completed No and provided to Patient/Care Robby Pirani: Provided on Clinical Summary of Care: 11/10/2016 Form Type [facility]ipient Paper [facility] Signature(s) Signed: 11/10/2016 3:06:55 PM By: Gwenlyn PerkingMoore, Shelia Entered By: Gwenlyn PerkingMoore, Shelia on 11/10/2016 15:06:55 Eckert, Raneen ([id number]) -------------------------------------------------------------------------------- Lower Extremity Assessment Details Patient Name: [name] LoboHEELY, Makaylia Date of Service: 11/10/2016 2:30 PM Medical Record Patient Account Number: [id number] Number: Treating RN: [name] [birth year] (80 y.o. Other Clinician: Date of Birth/Sex: Female) Treating [name] Primary Care Naveen Clardy: [name] Dullullo, Teresa Adelfo Diebel/Extender: G Referring Shammond Arave: [name] Brickullo, Teresa Weeks in Treatment: 12 Vascular Assessment Pulses: Dorsalis Pedis Palpable: [Left:Yes] [Right:Yes] Posterior Tibial Extremity colors, hair growth, and conditions: Extremity Color: [Left:Hyperpigmented] [Right:Hyperpigmented] Temperature of Extremity: [Left:Warm] [Right:Warm] Capillary Refill: [Left:< 3 seconds] [Right:< 3 seconds] Toe Nail Assessment Left: Right: Thick: Yes Yes Discolored: No No Deformed: No No Improper Length and Hygiene: No No Electronic Signature(s) Signed: 11/10/2016 5:03:57 PM By: [name] MullingPinkerton, Debra Entered By: [name] MullingPinkerton, Debra on 11/10/2016 14:39:46 Richman, Iretha ([id number]) -------------------------------------------------------------------------------- Multi Wound Chart Details Patient Name: [name] LoboHEELY, Jacia Date of Service: 11/10/2016 2:30 PM Medical Record  Patient Account Number: [id number] Number: Treating RN: [name] [birth year] (80 y.o. Other Clinician: Date of Birth/Sex: Female) Treating [name] Primary Care Shauntelle Jamerson: [name] Dullullo, Teresa Cayleen Benjamin/Extender: G Referring Kier Smead: [name] Brickullo, Teresa Weeks in Treatment: 12 Vital Signs Height(in): 66 Pulse(bpm): 73 Weight(lbs): 140.8 Blood Pressure 156/46 (mmHg): Body Mass Index(BMI): 23 Temperature(F): 97.5 Respiratory Rate 18 (breaths/min): Photos: [1:No Photos] [2:No Photos] [N/A:N/A] Wound Location: [1:Right Calcaneus] [2:Left Calcaneus] [N/A:N/A] Wounding Event: [1:Pressure Injury] [2:Pressure Injury] [N/A:N/A] Primary Etiology: [1:Pressure Ulcer] [2:Pressure Ulcer] [N/A:N/A] Comorbid History: [1:Cataracts, Anemia, Congestive Heart Failure, Congestive  Heart Failure, Hypertension, Type II Diabetes, Gout, Dementia, Diabetes, Gout, Dementia, Neuropathy] [2:Cataracts, Anemia, Hypertension, Type II Neuropathy] [N/A:N/A] Date Acquired: [1:07/05/2016] [2:07/05/2016] [N/A:N/A] Weeks of Treatment: [1:12] [2:12] [N/A:N/A] Wound Status: [1:Open] [2:Open] [N/A:N/A] Measurements L x W x D 1x2.2x0.2 [2:1.4x2.7x0.1] [N/A:N/A] (cm) Area (cm) : [1:1.728] [2:2.969] [N/A:N/A] Volume (cm) : [1:0.346] [2:0.297] [N/A:N/A] % Reduction in Area: [1:75.60%] [2:73.00%] [N/A:N/A] % Reduction in Volume: 75.50% [2:86.50%] [N/A:N/A] Classification: [1:Category/Stage III] [2:Category/Stage III] [N/A:N/A] HBO Classification: [1:Grade 1] [2:Grade 1] [N/A:N/A] Exudate Amount: [1:Large] [2:Large] [N/A:N/A] Exudate Type: [1:Serous] [2:Serous] [N/A:N/A] Exudate Color: [1:amber] [2:amber] [N/A:N/A] Foul Odor After [1:Yes] [2:Yes] [N/A:N/A] Cleansing: Odor Anticipated Due to No [2:No] [N/A:N/A] Product Use: Wound Margin: [1:Flat and Intact] [2:Flat and Intact] [N/A:N/A] Granulation Amount: Large (67-100%) Large (67-100%) N/A Granulation Quality: Pink Pink N/A Necrotic Amount: Small (1-33%)  Small (1-33%) N/A Necrotic Tissue: Eschar, Adherent Slough Eschar, Adherent Slough N/A Exposed Structures: Fascia: No Fascia: No N/A Fat Layer (Subcutaneous Fat Layer (Subcutaneous Tissue) Exposed: No Tissue) Exposed: No Tendon: No Tendon: No Muscle: No Muscle: No Joint: No Joint: No Bone: No Bone: No Limited to Skin Limited to Skin Breakdown Breakdown Epithelialization: None None N/A Periwound Skin Texture: Excoriation: No Excoriation: No N/A Induration: No Induration: No Callus: No Callus: No Crepitus: No Crepitus: No Rash: No Rash: No Scarring: No Scarring: No Periwound Skin Maceration: No Maceration: No N/A Moisture: Dry/Scaly: No Dry/Scaly: No Periwound Skin Color: Atrophie Blanche: No Atrophie Blanche: No N/A Cyanosis: No Cyanosis: No Ecchymosis: No Ecchymosis: No Erythema: No Erythema: No Hemosiderin Staining: No Hemosiderin Staining: No Mottled: No Mottled: No Pallor: No Pallor: No Rubor: No Rubor: No Temperature: No Abnormality No Abnormality N/A Tenderness on Yes Yes N/A Palpation: Wound Preparation: Ulcer Cleansing: Ulcer Cleansing: N/A Rinsed/Irrigated with Rinsed/Irrigated with Saline Saline Topical Anesthetic Topical Anesthetic Applied: Other: lidocaine Applied: Other: lidocaine 4% 4% Treatment Notes Wound #1 (Right Calcaneus) 1. Cleansed with: Clean wound with Normal Saline 2. Anesthetic Topical Lidocaine 4% cream to wound bed prior to debridement 4. Dressing Applied: Persis Graffius, Abbygael ([id number]) 5. Secondary Dressing Applied Dry Gauze Foam Kerlix/Conform 7. Secured with Tape Notes heel cups, stretch netting Wound #2 (Left Calcaneus) 1. Cleansed with: Clean wound with Normal Saline 2. Anesthetic Topical Lidocaine 4% cream to wound bed prior to debridement 4. Dressing Applied: Hydrafera Blue 5. Secondary Dressing Applied Dry Gauze Foam Kerlix/Conform 7. Secured with Tape Notes heel cups, stretch  netting Electronic Signature(s) Signed: 11/10/2016 4:47:30 PM By: Baltazar Najjar MD Entered By: Baltazar Najjar on 11/10/2016 16:10:22 Wilmouth, Chantale ([id number]) -------------------------------------------------------------------------------- Multi-Disciplinary Care Plan Details Patient Name: [name] Date of Service: 11/10/2016 2:30 PM Medical Record Patient Account Number: [id number] Number: Treating RN: [name] [birth year] (80 y.o. Other Clinician: Date of Birth/Sex: Female) Treating [name] Primary Care Noor Vidales: [name] Lakisha Peyser/Extender: G Referring Aaralynn Shepheard: [name] in Treatment: 12 Active Inactive ` Abuse / Safety / Falls / Self Care Management Nursing Diagnoses: Impaired physical mobility Potential for falls Goals: Patient will remain injury free Date Initiated: 08/18/2016 Target Resolution Date: 10/29/2016 Goal Status: Active Interventions: Assess fall risk on admission and as needed Notes: ` Nutrition Nursing Diagnoses: Potential for alteratiion in Nutrition/Potential for imbalanced nutrition Goals: Patient/caregiver agrees to and verbalizes understanding of need to use nutritional supplements and/or vitamins as prescribed Date Initiated: 08/18/2016 Target Resolution Date: 10/29/2016 Goal Status: Active Interventions: Assess patient nutrition upon admission and as needed per policy Notes: ` Orientation to the Wound Care Program [place], [place] ([id number]) Nursing Diagnoses: Knowledge deficit  related to the wound healing center program Goals: Patient/caregiver will verbalize understanding of the Wound Healing Center Program Date Initiated: 08/18/2016 Target Resolution Date: 10/29/2016 Goal Status: Active Interventions: Provide education on orientation to the wound center Notes: ` Wound/Skin Impairment Nursing Diagnoses: Impaired tissue integrity Goals: Patient/caregiver will verbalize understanding of skin  care regimen Date Initiated: 08/18/2016 Target Resolution Date: 10/29/2016 Goal Status: Active Ulcer/skin breakdown will have a volume reduction of 30% by week 4 Date Initiated: 08/18/2016 Target Resolution Date: 10/29/2016 Goal Status: Active Ulcer/skin breakdown will have a volume reduction of 50% by week 8 Date Initiated: 08/18/2016 Target Resolution Date: 10/29/2016 Goal Status: Active Ulcer/skin breakdown will have a volume reduction of 80% by week 12 Date Initiated: 08/18/2016 Target Resolution Date: 10/29/2016 Goal Status: Active Ulcer/skin breakdown will heal within 14 weeks Date Initiated: 08/18/2016 Target Resolution Date: 10/29/2016 Goal Status: Active Interventions: Assess patient/caregiver ability to obtain necessary supplies Assess patient/caregiver ability to perform ulcer/skin care regimen upon admission and as needed Assess ulceration(s) every visit Notes: Electronic Signature(s) Signed: 11/10/2016 5:03:57 PM By: Barth Kirks, Beyounce ([id number]) Entered By: [name] on 11/10/2016 14:36:08 Cristobal, Alice ([id number]) -------------------------------------------------------------------------------- Pain Assessment Details Patient Name: [name] Date of Service: 11/10/2016 2:30 PM Medical Record Patient Account Number: [id number] Number: Treating RN: [name] [birth year] (80 y.o. Other Clinician: Date of Birth/Sex: Female) Treating [name] Primary Care Beau Ramsburg: [name] Lavonna Lampron/Extender: G Referring Ozelle Brubacher: [name] in Treatment: 12 Active Problems Location of Pain Severity and Description of Pain Patient Has Paino No Site Locations With Dressing Change: No Pain Management and Medication Current Pain Management: Electronic Signature(s) Signed: 11/10/2016 5:03:57 PM By: [name] Entered By: [name] on 11/10/2016 14:23:22 Gillies, Shavaun  ([id number]) -------------------------------------------------------------------------------- Patient/Caregiver Education Details Patient Name: [name] Date of Service: 11/10/2016 2:30 PM Medical Record Patient Account Number: [id number] Number: Treating RN: [name] [birth year] (80 y.o. Other Clinician: Date of Birth/Gender: Female) Treating [name] Primary Care Physician/Extender: Virgil Benedict Physician: Tania Ade in Treatment: 12 Referring Physician: Duncan [name] Education Assessment Education Provided To: Patient Education Topics Provided Wound/Skin Impairment: Handouts: Other: change dressing as ordered Methods: Demonstration, Explain/Verbal Responses: State content correctly Electronic Signature(s) Signed: 11/10/2016 5:03:57 PM By: [name] Entered By: [name] on 11/10/2016 14:56:07 Radell, Lailee ([id number]) -------------------------------------------------------------------------------- Wound Assessment Details Patient Name: [name] Date of Service: 11/10/2016 2:30 PM Medical Record Patient Account Number: [id number] Number: Treating RN: [name] December 09, 1935 (80 y.o. Other Clinician: Date of Birth/Sex: Female) Treating [name] Primary Care Christpoher Sievers: [name] Jarryn Altland/Extender: G Referring Abdul Beirne: [name] in Treatment: 12 Wound Status Wound Number: 1 Primary Pressure Ulcer Etiology: Wound Location: Right Calcaneus Wound Open Wounding Event: Pressure Injury Status: Date Acquired: 07/05/2016 Comorbid Cataracts, Anemia, Congestive Heart Weeks Of Treatment: 12 History: Failure, Hypertension, Type II Diabetes, Clustered Wound: No Gout, Dementia, Neuropathy Photos Photo Uploaded By: [name] on 11/10/2016 16:40:37 Wound Measurements Length: (cm) 1 Width: (cm) 2.2 Depth: (cm) 0.2 Area: (cm) 1.728 Volume: (cm) 0.346 % Reduction in Area: 75.6% %  Reduction in Volume: 75.5% Epithelialization: None Tunneling: No Undermining: No Wound Description Classification: Category/Stage III Foul Odor Afte Diabetic Severity (Wagner): Grade 1 Due to Product Wound Margin: Flat and Intact Slough/Fibrino Exudate Amount: Large Exudate Type: Serous Exudate Color: amber r Cleansing: Yes Use: No No Wound Bed Granulation Amount: Large (67-100%) Exposed Structure Bacorn, Jaline ([id number]) Granulation Quality: Pink Fascia Exposed: No Necrotic Amount: Small (1-33%) Fat Layer (Subcutaneous Tissue) Exposed: No Necrotic Quality: Eschar,  Adherent Slough Tendon Exposed: No Muscle Exposed: No Joint Exposed: No Bone Exposed: No Limited to Skin Breakdown Periwound Skin Texture Texture Color No Abnormalities Noted: No No Abnormalities Noted: No Callus: No Atrophie Blanche: No Crepitus: No Cyanosis: No Excoriation: No Ecchymosis: No Induration: No Erythema: No Rash: No Hemosiderin Staining: No Scarring: No Mottled: No Pallor: No Moisture Rubor: No No Abnormalities Noted: No Dry / Scaly: No Temperature / Pain Maceration: No Temperature: No Abnormality Tenderness on Palpation: Yes Wound Preparation Ulcer Cleansing: Rinsed/Irrigated with Saline Topical Anesthetic Applied: Other: lidocaine 4%, Treatment Notes Wound #1 (Right Calcaneus) 1. Cleansed with: Clean wound with Normal Saline 2. Anesthetic Topical Lidocaine 4% cream to wound bed prior to debridement 4. Dressing Applied: Hydrafera Blue 5. Secondary Dressing Applied Dry Gauze Foam Kerlix/Conform 7. Secured with Tape Notes heel cups, stretch netting Electronic Signature(s) Signed: 11/10/2016 5:03:57 PM By: Barth Kirks, Lesette ([id number]) Entered By: [name] on 11/10/2016 14:32:09 Spink, Kalayah ([id number]) -------------------------------------------------------------------------------- Wound Assessment Details Patient Name: [name] Date  of Service: 11/10/2016 2:30 PM Medical Record Patient Account Number: [id number] Number: Treating RN: [name] 1935-07-23 (80 y.o. Other Clinician: Date of Birth/Sex: Female) Treating [name] Primary Care Jazzmon Prindle: [name] Kepler Mccabe/Extender: G Referring Shamia Uppal: [name] in Treatment: 12 Wound Status Wound Number: 2 Primary Pressure Ulcer Etiology: Wound Location: Left Calcaneus Wound Open Wounding Event: Pressure Injury Status: Date Acquired: 07/05/2016 Comorbid Cataracts, Anemia, Congestive Heart Weeks Of Treatment: 12 History: Failure, Hypertension, Type II Diabetes, Clustered Wound: No Gout, Dementia, Neuropathy Photos Photo Uploaded By: [name] on 11/10/2016 16:40:37 Wound Measurements Length: (cm) 1.4 Width: (cm) 2.7 Depth: (cm) 0.1 Area: (cm) 2.969 Volume: (cm) 0.297 % Reduction in Area: 73% % Reduction in Volume: 86.5% Epithelialization: None Tunneling: No Undermining: No Wound Description Classification: Category/Stage III Foul Odor Afte Diabetic Severity (Wagner): Grade 1 Due to Product Wound Margin: Flat and Intact Slough/Fibrino Exudate Amount: Large Exudate Type: Serous Exudate Color: amber r Cleansing: Yes Use: No No Wound Bed Granulation Amount: Large (67-100%) Exposed Structure Hoskinson, Winry ([id number]) Granulation Quality: Pink Fascia Exposed: No Necrotic Amount: Small (1-33%) Fat Layer (Subcutaneous Tissue) Exposed: No Necrotic Quality: Eschar, Adherent Slough Tendon Exposed: No Muscle Exposed: No Joint Exposed: No Bone Exposed: No Limited to Skin Breakdown Periwound Skin Texture Texture Color No Abnormalities Noted: No No Abnormalities Noted: No Callus: No Atrophie Blanche: No Crepitus: No Cyanosis: No Excoriation: No Ecchymosis: No Induration: No Erythema: No Rash: No Hemosiderin Staining: No Scarring: No Mottled: No Pallor: No Moisture Rubor: No No Abnormalities  Noted: No Dry / Scaly: No Temperature / Pain Maceration: No Temperature: No Abnormality Tenderness on Palpation: Yes Wound Preparation Ulcer Cleansing: Rinsed/Irrigated with Saline Topical Anesthetic Applied: Other: lidocaine 4%, Treatment Notes Wound #2 (Left Calcaneus) 1. Cleansed with: Clean wound with Normal Saline 2. Anesthetic Topical Lidocaine 4% cream to wound bed prior to debridement 4. Dressing Applied: Hydrafera Blue 5. Secondary Dressing Applied Dry Gauze Foam Kerlix/Conform 7. Secured with Tape Notes heel cups, stretch netting Electronic Signature(s) Signed: 11/10/2016 5:03:57 PM By: Barth Kirks, Andrey ([id number]) Entered By: [name] on 11/10/2016 14:33:22 Newsom, Mylee ([id number]) -------------------------------------------------------------------------------- Vitals Details Patient Name: [name] Date of Service: 11/10/2016 2:30 PM Medical Record Patient Account Number: [id number] Number: Treating RN: [name] 24-Aug-1935 (80 y.o. Other Clinician: Date of Birth/Sex: Female) Treating [name] Primary Care Michal Strzelecki: [name] Kasee Hantz/Extender: G Referring Karista Aispuro: [name] in Treatment: 12 Vital Signs Time Taken: 14:23 Temperature (F): 97.5 Height (in):  66 Pulse (bpm): 73 Weight (lbs): 140.8 Respiratory Rate (breaths/min): 18 Body Mass Index (BMI): 22.7 Blood Pressure (mmHg): 156/46 Reference Range: 80 - 120 mg / dl Electronic Signature(s) Signed: 11/10/2016 5:03:57 PM By: [name] Entered By: [name] on 11/10/2016 14:23:40

## 2016-11-24 ENCOUNTER — Encounter: Payer: Medicare (Managed Care) | Attending: Internal Medicine | Admitting: Internal Medicine

## 2016-11-24 DIAGNOSIS — F0280 Dementia in other diseases classified elsewhere without behavioral disturbance: Secondary | ICD-10-CM | POA: Insufficient documentation

## 2016-11-24 DIAGNOSIS — L89613 Pressure ulcer of right heel, stage 3: Secondary | ICD-10-CM | POA: Diagnosis not present

## 2016-11-24 DIAGNOSIS — E1140 Type 2 diabetes mellitus with diabetic neuropathy, unspecified: Secondary | ICD-10-CM | POA: Insufficient documentation

## 2016-11-24 DIAGNOSIS — I129 Hypertensive chronic kidney disease with stage 1 through stage 4 chronic kidney disease, or unspecified chronic kidney disease: Secondary | ICD-10-CM | POA: Insufficient documentation

## 2016-11-24 DIAGNOSIS — L89623 Pressure ulcer of left heel, stage 3: Secondary | ICD-10-CM | POA: Insufficient documentation

## 2016-11-24 DIAGNOSIS — E1122 Type 2 diabetes mellitus with diabetic chronic kidney disease: Secondary | ICD-10-CM | POA: Diagnosis not present

## 2016-11-24 DIAGNOSIS — N184 Chronic kidney disease, stage 4 (severe): Secondary | ICD-10-CM | POA: Diagnosis not present

## 2016-11-24 DIAGNOSIS — G309 Alzheimer's disease, unspecified: Secondary | ICD-10-CM | POA: Insufficient documentation

## 2016-11-26 NOTE — Progress Notes (Signed)
[name], Gayathri ([id number]) Visit Report for 11/24/2016 HPI Details Patient Name: [name] LoboCHEELY, Sakara Date of Service: 11/24/2016 1:30 PM Medical Record Patient Account Number: [id number] Number: Treating RN: Curtis SitesDorthy, Joanna Sep 06, 1935 (81 y.o. Other Clinician: Date of Birth/Sex: Female) Treating ROBSON, [name] Primary Care Provider: Duncan Dullullo, Teresa Provider/Extender: G Referring Provider: Denton Brickullo, Teresa Weeks in Treatment: 14 History of Present Illness HPI Description: 08/18/16; this is a 81 year old woman who is a type II diabetic not currently on any treatment. She is also listed as having Alzheimer's disease hypertension. She has a history of chronic venous insufficiency with leg wounds in the past but no history of wounds in her feet. In terms of her diabetes at one point she was on insulin however she is no longer on any current treatment, her daughter who is providing most of the history is unaware of her hemoglobin A1c. She has stage IV chronic renal failure and follows with nephrology. The history provided by her daughter is that she has had a wound on the left heel perhaps dating back to last May/17. At that point she was in the hospital predominantly with psychiatric issues and was [facility]. This was treated with some form of foam dressing and may have actually healed however she was readmitted to hospital in January with Escherichia coli sepsis felt to be secondary to a UTI. Since then she is in [facility] skilled facility. Apparently this timeframe both the left heel and right heel reopened or at least the daughter became aware that they were both open. The exact timeframe isn't really certain Her ABIs in this clinic were 1.08 on the right 1.25 on the left. Looking through [facility] doesn't really provide any useful information. She did have venous ultrasounds in 2016 that did not show a DVT. I don't see a recent hemoglobin A1c 08/25/16; from last week  we have been using Santyl to both heels. Apparently the nursing home didn't get an x-ray of both heels [[facility]] and the daughter states that there was "no injury to bone" but for some reason we haven't been able to get the official report from them. 09/01/16; continued improvement in both wounds on her bilateral heels using Santyl. X-ray report was negative for osteomyelitis 09/08/16; patient from [facility]. Using Santyl on both her heel wounds which are fortunately not on the plantar surface. 09/15/16; patient is from [facility]. She has been using Santyl on both her pressure areas which were stage III. Fortunately these or not on the plantar surface. 09/22/16; patient arrives today with a much less viable looking surface on her heels. We had been using Santyl with good improvement however unchanged either fair last week. This may be unrelated however she required debridement today. 09/29/16; patient arrives with a better looking surface on the left heel unfortunately this is a more substantial wound. The area on the right lateral calcaneus again covered in a nonviable necrotic surface. We have been making good progress with Santyl. I think I'm going to need to go back to a debriding agent. 10/06/16; still nonviable surface material over the right greater than the left heel. [facility][id number][facility] this woman resides did not have Iodoflex so rather than calling the clinic they decided to make some cocktail of material and then settled on Hydrofera Blue. Patient is really generally made pretty good progress versus when she came in her first however still has too much nonviable surface especially on the  right. 10/13/16; she arrives today with Iodoflex on both heels. This was the dressing that we couldn't seem to get last week. Her daughter tells [place] that where they were traveling to [place] last weekend she actually  use Santyl. Both wounds are in better condition today and didn't seem to require debridement however I'm not exactly sure what is been most helpful 10/20/16; using Santyl to both heels better condition. Nursing reports green drainage 11/03/16 patient wounds appeared to be doing fairly well on evaluation today she seen along with her daughter in the office today. She does have slough covering the wound unfortunately no evidence of infection. 11/10/16; both wounds appear to be making nice progress. This is on the bilateral heels 11/24/16; he also continued to be making nice progress. Wounds are smaller. Using Hydrofera Blue. Orders are to change daily in the nursing home although according to her daughter they're actually being changed to her 3 times a week which is probably satisfactory Electronic Signature(s) Signed: 11/25/2016 3:50:17 PM By: Baltazar Najjar MD Entered By: Baltazar Najjar on 11/24/2016 14:35:00 Truby, Mio ([id number]) -------------------------------------------------------------------------------- Physical Exam Details Patient Name: [name] Date of Service: 11/24/2016 1:30 PM Medical Record Patient Account Number: [id number] Number: Treating RN: Curtis Sites [birth year] (81 y.o. Other Clinician: Date of Birth/Sex: Female) Treating ROBSON, [name] Primary Care Provider: Duncan Dull Provider/Extender: G Referring Provider: Denton Brick in Treatment: 14 Constitutional Sitting or standing Blood Pressure is within target range for patient.. Pulse regular and within target range for patient.Marland Kitchen Respirations regular, non-labored and within target range.. Temperature is normal and within the target range for the patient.Marland Kitchen appears in no distress. Eyes Conjunctivae clear. No discharge. Respiratory Respiratory effort is easy and symmetric bilaterally. Rate is normal at rest and on room air.. Cardiovascular Pedal pulses palpable. Lymphatic None palpable  in the popliteal or inguinal area. Psychiatric No evidence of depression, anxiety, or agitation. Calm, cooperative, and communicative. Appropriate interactions and affect.. Notes Wound exam; both heel wounds appeared to of contracted in terms of area in the surface actually looks satisfactory. No debridement is required. There is no evidence of surrounding infection Electronic Signature(s) Signed: 11/25/2016 3:50:17 PM By: Baltazar Najjar MD Entered By: Baltazar Najjar on 11/24/2016 14:37:17 Cadiente, Mileigh ([id number]) -------------------------------------------------------------------------------- Physician Orders Details Patient Name: [name] Date of Service: 11/24/2016 1:30 PM Medical Record Patient Account Number: [id number] Number: Treating RN: Curtis Sites [birth year] (80 y.o. Other Clinician: Date of Birth/Sex: Female) Treating ROBSON, [name] Primary Care Provider: Duncan Dull Provider/Extender: G Referring Provider: Denton Brick in Treatment: 14 Verbal / Phone Orders: No Diagnosis Coding Wound Cleansing Wound #1 Right Calcaneus o Clean wound with Normal Saline. o May Shower, gently pat wound dry prior to applying new dressing. Wound #2 Left Calcaneus o Clean wound with Normal Saline. o May Shower, gently pat wound dry prior to applying new dressing. Anesthetic Wound #1 Right Calcaneus o Topical Lidocaine 4% cream applied to wound bed prior to debridement Wound #2 Left Calcaneus o Topical Lidocaine 4% cream applied to wound bed prior to debridement Primary Wound Dressing Wound #1 Right Calcaneus o Hydrafera Blue Wound #2 Left Calcaneus o Hydrafera Blue Secondary Dressing Wound #1 Right Calcaneus o Dry Gauze o Conform/Kerlix o Foam - heel cups o Other - stretch netting #4 (please order for pt...daughter is aware) Wound #2 Left Calcaneus o Dry Gauze o Conform/Kerlix o Foam - heel cups Placke, Altheria  ([id number]) o Other - stretch netting #4 (please order for pt...daughter is aware) Dressing  Change Frequency Wound #1 Right Calcaneus o Change dressing every day. Wound #2 Left Calcaneus o Change dressing every day. Follow-up Appointments Wound #1 Right Calcaneus o Return Appointment in 2 weeks. Wound #2 Left Calcaneus o Return Appointment in 2 weeks. Edema Control Wound #1 Right Calcaneus o Elevate legs to the level of the heart and pump ankles as often as possible Wound #2 Left Calcaneus o Elevate legs to the level of the heart and pump ankles as often as possible Off-Loading Wound #1 Right Calcaneus o Turn and reposition every 2 hours o Other: - wear heel protector boots at al times in bed, float heels while in bed, Wound #2 Left Calcaneus o Turn and reposition every 2 hours o Other: - wear heel protector boots at al times in bed, float heels while in bed, Additional Orders / Instructions Wound #1 Right Calcaneus o Increase protein intake. o Other: - please add vitamin A, vitamin C and zinc supplements to your diet Wound #2 Left Calcaneus o Increase protein intake. o Other: - please add vitamin A, vitamin C and zinc supplements to your diet Electronic Signature(s) Signed: 11/24/2016 5:04:35 PM By: Curtis Sites Signed: 11/25/2016 3:50:17 PM By: Baltazar Najjar MD Entered By: Curtis Sites on 11/24/2016 14:07:33 Bonifay, Veryl ([id number]) Naranjito, Tully ([id number]) -------------------------------------------------------------------------------- Problem List Details Patient Name: [name] Date of Service: 11/24/2016 1:30 PM Medical Record Patient Account Number: [id number] Number: Treating RN: Curtis Sites [birth year] (80 y.o. Other Clinician: Date of Birth/Sex: Female) Treating ROBSON, [name] Primary Care Provider: Duncan Dull Provider/Extender: G Referring Provider: Denton Brick in Treatment: 14 Active  Problems ICD-10 Encounter Code Description Active Date Diagnosis E11.621 Type 2 diabetes mellitus with foot ulcer 08/18/2016 Yes L89.623 Pressure ulcer of left heel, stage 3 08/18/2016 Yes L89.613 Pressure ulcer of right heel, stage 3 08/18/2016 Yes Inactive Problems Resolved Problems Electronic Signature(s) Signed: 11/25/2016 3:50:17 PM By: Baltazar Najjar MD Entered By: Baltazar Najjar on 11/24/2016 14:31:40 Glaus, Kiri ([id number]) -------------------------------------------------------------------------------- Progress Note Details Patient Name: [name] Date of Service: 11/24/2016 1:30 PM Medical Record Patient Account Number: [id number] Number: Treating RN: Curtis Sites 1935-07-30 (80 y.o. Other Clinician: Date of Birth/Sex: Female) Treating ROBSON, [name] Primary Care Provider: Duncan Dull Provider/Extender: G Referring Provider: Denton Brick in Treatment: 14 Subjective History of Present Illness (HPI) 08/18/16; this is a 81 year old woman who is a type II diabetic not currently on any treatment. She is also listed as having Alzheimer's disease hypertension. She has a history of chronic venous insufficiency with leg wounds in the past but no history of wounds in her feet. In terms of her diabetes at one point she was on insulin however she is no longer on any current treatment, her daughter who is providing most of the history is unaware of her hemoglobin A1c. She has stage IV chronic renal failure and follows with nephrology. The history provided by her daughter is that she has had a wound on the left heel perhaps dating back to last May/17. At that point she was in the hospital predominantly with psychiatric issues and was [facility]. This was treated with some form of foam dressing and may have actually healed however she was readmitted to hospital in January with Escherichia coli sepsis felt to be secondary to a UTI. Since then she  is in [facility]. Apparently this timeframe both the left heel and right heel reopened or at least the daughter became aware that they were both open. The  exact timeframe isn't really certain Her ABIs in this clinic were 1.08 on the right 1.25 on the left. Looking through [facility] doesn't really provide any useful information. She did have venous ultrasounds in 2016 that did not show a DVT. I don't see a recent hemoglobin A1c 08/25/16; from last week we have been using Santyl to both heels. Apparently the nursing home didn't get an x-ray of both heels [[facility]] and the daughter states that there was "no injury to bone" but for some reason we haven't been able to get the official report from them. 09/01/16; continued improvement in both wounds on her bilateral heels using Santyl. X-ray report was negative for osteomyelitis 09/08/16; patient from [facility] skilled facility. Using Santyl on both her heel wounds which are fortunately not on the plantar surface. 09/15/16; patient is from [facility] skilled facility. She has been using Santyl on both her pressure areas which were stage III. Fortunately these or not on the plantar surface. 09/22/16; patient arrives today with a much less viable looking surface on her heels. We had been using Santyl with good improvement however unchanged either fair last week. This may be unrelated however she required debridement today. 09/29/16; patient arrives with a better looking surface on the left heel unfortunately this is a more substantial wound. The area on the right lateral calcaneus again covered in a nonviable necrotic surface. We have been making good progress with Santyl. I think I'm going to need to go back to a debriding agent. 10/06/16; still nonviable surface material over the right greater than the left heel. Apparently the facility where this woman resides did not have Iodoflex so rather than calling the  clinic they decided to make some cocktail of material and then settled on Hydrofera Blue. Patient is really generally made pretty good progress versus when she came in her first however still has too much nonviable surface especially on the [facility] [place] ([id number]) right. 10/13/16; she arrives today with Iodoflex on both heels. This was the dressing that we couldn't seem to get last week. Her daughter tells [place] that where they were traveling to [place] last weekend she actually use Santyl. Both wounds are in better condition today and didn't seem to require debridement however I'm not exactly sure what is been most helpful 10/20/16; using Santyl to both heels better condition. Nursing reports green drainage 11/03/16 patient wounds appeared to be doing fairly well on evaluation today she seen along with her daughter in the office today. She does have slough covering the wound unfortunately no evidence of infection. 11/10/16; both wounds appear to be making nice progress. This is on the bilateral heels 11/24/16; he also continued to be making nice progress. Wounds are smaller. Using Hydrofera Blue. Orders are to change daily in the nursing home although according to her daughter they're actually being changed to her 3 times a week which is probably satisfactory Objective Constitutional Sitting or standing Blood Pressure is within target range for patient.. Pulse regular and within target range for patient.Marland Kitchen Respirations regular, non-labored and within target range.. Temperature is normal and within the target range for the patient.Marland Kitchen appears in no distress. Vitals Time Taken: 1:53 PM, Height: 66 in, Weight: 140.8 lbs, BMI: 22.7, Temperature: 97.8 F, Pulse: 61 bpm, Respiratory Rate: 18 breaths/min, Blood Pressure: 119/54 mmHg. Eyes Conjunctivae clear. No discharge. Respiratory Respiratory effort is easy and symmetric bilaterally. Rate is normal at rest and on room air.. Cardiovascular Pedal  pulses palpable. Lymphatic None palpable  in the popliteal or inguinal area. Psychiatric No evidence of depression, anxiety, or agitation. Calm, cooperative, and communicative. Appropriate interactions and affect.Fayrene Helper. [place], Tekia ([id number]) General Notes: Wound exam; both heel wounds appeared to of contracted in terms of area in the surface actually looks satisfactory. No debridement is required. There is no evidence of surrounding infection Integumentary (Hair, Skin) Wound #1 status is Open. Original cause of wound was Pressure Injury. The wound is located on the Right Calcaneus. The wound measures 0.8cm length x 1.8cm width x 0.2cm depth; 1.131cm^2 area and 0.226cm^3 volume. The wound is limited to skin breakdown. There is no tunneling or undermining noted. There is a large amount of serous drainage noted. The wound margin is flat and intact. There is large (67- 100%) pink granulation within the wound bed. There is a small (1-33%) amount of necrotic tissue within the wound bed including Eschar and Adherent Slough. The periwound skin appearance did not exhibit: Callus, Crepitus, Excoriation, Induration, Rash, Scarring, Dry/Scaly, Maceration, Atrophie Blanche, Cyanosis, Ecchymosis, Hemosiderin Staining, Mottled, Pallor, Rubor, Erythema. Periwound temperature was noted as No Abnormality. The periwound has tenderness on palpation. Wound #2 status is Open. Original cause of wound was Pressure Injury. The wound is located on the Left Calcaneus. The wound measures 1.6cm length x 2.4cm width x 0.1cm depth; 3.016cm^2 area and 0.302cm^3 volume. The wound is limited to skin breakdown. There is no tunneling or undermining noted. There is a large amount of serous drainage noted. The wound margin is flat and intact. There is large (67- 100%) pink granulation within the wound bed. There is a small (1-33%) amount of necrotic tissue within the wound bed including Eschar and Adherent Slough. The periwound  skin appearance did not exhibit: Callus, Crepitus, Excoriation, Induration, Rash, Scarring, Dry/Scaly, Maceration, Atrophie Blanche, Cyanosis, Ecchymosis, Hemosiderin Staining, Mottled, Pallor, Rubor, Erythema. Periwound temperature was noted as No Abnormality. The periwound has tenderness on palpation. Assessment Active Problems ICD-10 E11.621 - Type 2 diabetes mellitus with foot ulcer L89.623 - Pressure ulcer of left heel, stage 3 L89.613 - Pressure ulcer of right heel, stage 3 Plan Wound Cleansing: Wound #1 Right Calcaneus: Clean wound with Normal Saline. May Shower, gently pat wound dry prior to applying new dressing. Wound #2 Left Calcaneus: Clean wound with Normal Saline. May Shower, gently pat wound dry prior to applying new dressing. Term[place], Veronnica ([id number]) Anesthetic: Wound #1 Right Calcaneus: Topical Lidocaine 4% cream applied to wound bed prior to debridement Wound #2 Left Calcaneus: Topical Lidocaine 4% cream applied to wound bed prior to debridement Primary Wound Dressing: Wound #1 Right Calcaneus: Hydrafera Blue Wound #2 Left Calcaneus: Hydrafera Blue Secondary Dressing: Wound #1 Right Calcaneus: Dry Gauze Conform/Kerlix Foam - heel cups Other - stretch netting #4 (please order for pt...daughter is aware) Wound #2 Left Calcaneus: Dry Gauze Conform/Kerlix Foam - heel cups Other - stretch netting #4 (please order for pt...daughter is aware) Dressing Change Frequency: Wound #1 Right Calcaneus: Change dressing every day. Wound #2 Left Calcaneus: Change dressing every day. Follow-up Appointments: Wound #1 Right Calcaneus: Return Appointment in 2 weeks. Wound #2 Left Calcaneus: Return Appointment in 2 weeks. Edema Control: Wound #1 Right Calcaneus: Elevate legs to the level of the heart and pump ankles as often as possible Wound #2 Left Calcaneus: Elevate legs to the level of the heart and pump ankles as often as possible Off-Loading: Wound #1 Right  Calcaneus: Turn and reposition every 2 hours Other: - wear heel protector boots at al times in bed, float heels  while in bed, Wound #2 Left Calcaneus: Turn and reposition every 2 hours Other: - wear heel protector boots at al times in bed, float heels while in bed, Additional Orders / Instructions: Wound #1 Right Calcaneus: Increase protein intake. Other: - please add vitamin A, vitamin C and zinc supplements to your diet Wound #2 Left Calcaneus: Increase protein intake. Other: - please add vitamin A, vitamin C and zinc supplements to your diet Tomaro, Shellia ([id number]) #1continue hydrofera #2 patient making great progress Electronic Signature(s) Signed: 11/25/2016 3:50:17 PM By: Baltazar Najjar MD Entered By: Baltazar Najjar on 11/24/2016 14:39:49 Cappuccio, Charo ([id number]) -------------------------------------------------------------------------------- SuperBill Details Patient Name: [name] Date of Service: 11/24/2016 Medical Record Patient Account Number: [id number] Number: Treating RN: Curtis Sites 10-12-1935 (80 y.o. Other Clinician: Date of Birth/Sex: Female) Treating ROBSON, [name] Primary Care Provider: Duncan Dull Provider/Extender: G Referring Provider: Denton Brick in Treatment: 14 Diagnosis Coding ICD-10 Codes Code Description E11.621 Type 2 diabetes mellitus with foot ulcer L89.623 Pressure ulcer of left heel, stage 3 L89.613 Pressure ulcer of right heel, stage 3 Facility Procedures CPT4 Code: [id number] Description: 99213 - WOUND CARE VISIT-LEV 3 EST PT Modifier: Quantity: 1 Physician Procedures CPT4 Code: [id number] Description: 99213 - WC PHYS LEVEL 3 - EST PT ICD-10 Description Diagnosis E11.621 Type 2 diabetes mellitus with foot ulcer L89.623 Pressure ulcer of left heel, stage 3 L89.613 Pressure ulcer of right heel, stage 3 Modifier: Quantity: 1 Electronic Signature(s) Signed: 11/24/2016 4:59:49 PM By: Curtis Sites Signed:  11/25/2016 3:50:17 PM By: Baltazar Najjar MD Entered By: Curtis Sites on 11/24/2016 16:59:49

## 2016-11-26 NOTE — Progress Notes (Signed)
[name], Bansal [name] ([id number]) Visit Report for 11/24/2016 Arrival Information Details Patient Name: [name], [name] Date of Service: 11/24/2016 1:30 PM Medical Record Patient Account Number: [id number] Number: Treating RN: Curtis Sites 01-03-1936 (81 y.o. Other Clinician: Date of Birth/Sex: Female) Treating ROBSON, [name] Primary Care Nicholaos Schippers: Duncan Dull Zorian Gunderman/Extender: G Referring Brooks Kinnan: Denton Brick in Treatment: 14 Visit Information History Since Last Visit Added or deleted any medications: No Patient Arrived: Walker Any new allergies or adverse reactions: No Arrival Time: 13:51 Had a fall or experienced change in No Accompanied By: dtr activities of daily living that may affect Transfer Assistance: None risk of falls: Patient Identification Verified: Yes Signs or symptoms of abuse/neglect since last No Secondary Verification Process Completed: Yes visito Patient Requires Transmission-Based No Hospitalized since last visit: No Precautions: Has Dressing in Place as Prescribed: Yes Patient Has Alerts: Yes Pain Present Now: No Patient Alerts: DM II Electronic Signature(s) Signed: 11/24/2016 5:04:35 PM By: Curtis Sites Entered By: Curtis Sites on 11/24/2016 13:53:11 [name], [name] ([id number]) -------------------------------------------------------------------------------- Clinic Level of Care Assessment Details Patient Name: [name] Date of Service: 11/24/2016 1:30 PM Medical Record Patient Account Number: [id number] Number: Treating RN: Curtis Sites 05-18-1935 (81 y.o. Other Clinician: Date of Birth/Sex: Female) Treating ROBSON, [name] Primary Care Inocente Krach: Duncan Dull Hang Ammon/Extender: G Referring Shyla Gayheart: Denton Brick in Treatment: 14 Clinic Level of Care Assessment Items TOOL 4 Quantity Score []  - Use when only an EandM is performed on FOLLOW-UP visit 0 ASSESSMENTS - Nursing Assessment / Reassessment X  - Reassessment of Co-morbidities (includes updates in patient status) 1 10 X - Reassessment of Adherence to Treatment Plan 1 5 ASSESSMENTS - Wound and Skin Assessment / Reassessment []  - Simple Wound Assessment / Reassessment - one wound 0 X - Complex Wound Assessment / Reassessment - multiple wounds 2 5 []  - Dermatologic / Skin Assessment (not related to wound area) 0 ASSESSMENTS - Focused Assessment []  - Circumferential Edema Measurements - multi extremities 0 []  - Nutritional Assessment / Counseling / Intervention 0 X - Lower Extremity Assessment (monofilament, tuning fork, pulses) 1 5 []  - Peripheral Arterial Disease Assessment (using hand held doppler) 0 ASSESSMENTS - Ostomy and/or Continence Assessment and Care []  - Incontinence Assessment and Management 0 []  - Ostomy Care Assessment and Management (repouching, etc.) 0 PROCESS - Coordination of Care X - Simple Patient / Family Education for ongoing care 1 15 []  - Complex (extensive) Patient / Family Education for ongoing care 0 []  - Staff obtains Chiropractor, Records, Test Results / Process Orders 0 []  - Staff telephones HHA, Nursing Homes / Clarify orders / etc 0 [name], [name] ([id number]) []  - Routine Transfer to another Facility (non-emergent condition) 0 []  - Routine Hospital Admission (non-emergent condition) 0 []  - New Admissions / Manufacturing engineer / Ordering NPWT, Apligraf, etc. 0 []  - Emergency Hospital Admission (emergent condition) 0 X - Simple Discharge Coordination 1 10 []  - Complex (extensive) Discharge Coordination 0 PROCESS - Special Needs []  - Pediatric / Minor Patient Management 0 []  - Isolation Patient Management 0 []  - Hearing / Language / Visual special needs 0 []  - Assessment of Community assistance (transportation, D/C planning, etc.) 0 []  - Additional assistance / Altered mentation 0 []  - Support Surface(s) Assessment (bed, cushion, seat, etc.) 0 INTERVENTIONS - Wound Cleansing / Measurement []  -  Simple Wound Cleansing - one wound 0 X - Complex Wound Cleansing - multiple wounds 2 5 X - Wound Imaging (photographs - any number of wounds) 1 5 []  -  Wound Tracing (instead of photographs) 0 []  - Simple Wound Measurement - one wound 0 X - Complex Wound Measurement - multiple wounds 2 5 INTERVENTIONS - Wound Dressings X - Small Wound Dressing one or multiple wounds 2 10 []  - Medium Wound Dressing one or multiple wounds 0 []  - Large Wound Dressing one or multiple wounds 0 []  - Application of Medications - topical 0 []  - Application of Medications - injection 0 [name], [name] ([id number]) INTERVENTIONS - Miscellaneous []  - External ear exam 0 []  - Specimen Collection (cultures, biopsies, blood, body fluids, etc.) 0 []  - Specimen(s) / Culture(s) sent or taken to Lab for analysis 0 []  - Patient Transfer (multiple staff / Nurse, adult / Similar devices) 0 []  - Simple Staple / Suture removal (25 or less) 0 []  - Complex Staple / Suture removal (26 or more) 0 []  - Hypo / Hyperglycemic Management (close monitor of Blood Glucose) 0 []  - Ankle / Brachial Index (ABI) - do not check if billed separately 0 X - Vital Signs 1 5 Has the patient been seen at the hospital within the last three years: Yes Total Score: 105 Level Of Care: New/Established - Level 3 Electronic Signature(s) Signed: 11/24/2016 5:04:35 PM By: Curtis Sites Entered By: Curtis Sites on 11/24/2016 16:59:39 [name], [name] ([id number]) -------------------------------------------------------------------------------- Encounter Discharge Information Details Patient Name: [name] Date of Service: 11/24/2016 1:30 PM Medical Record Patient Account Number: [id number] Number: Treating RN: Curtis Sites 25-Jun-1935 (81 y.o. Other Clinician: Date of Birth/Sex: Female) Treating ROBSON, [name] Primary Care Kadeidra Coryell: Duncan Dull Matisse Roskelley/Extender: G Referring Brissia Delisa: Denton Brick in Treatment: 14 Encounter  Discharge Information Items Discharge Pain Level: 0 Discharge Condition: Stable Ambulatory Status: Walker Nursing Discharge Destination: Home Transportation: Private Auto Accompanied By: dtr Schedule Follow-up Appointment: Yes Medication Reconciliation completed No and provided to Patient/Care Arsen Mangione: Provided on Clinical Summary of Care: 11/24/2016 Form Type Recipient Paper Patient [facility] Electronic Signature(s) Signed: 11/24/2016 2:21:48 PM By: Gwenlyn Perking Entered By: Gwenlyn Perking on 11/24/2016 14:21:47 Storck, Pansey ([id number]) -------------------------------------------------------------------------------- Lower Extremity Assessment Details Patient Name: [name] Date of Service: 11/24/2016 1:30 PM Medical Record Patient Account Number: [id number] Number: Treating RN: Curtis Sites 1935-07-11 (80 y.o. Other Clinician: Date of Birth/Sex: Female) Treating ROBSON, [name] Primary Care Leevi Cullars: Duncan Dull Jamison Soward/Extender: G Referring Babara Buffalo: Denton Brick in Treatment: 14 Vascular Assessment Pulses: Dorsalis Pedis Palpable: [Left:Yes] [Right:Yes] Posterior Tibial Extremity colors, hair growth, and conditions: Extremity Color: [Left:Hyperpigmented] [Right:Hyperpigmented] Hair Growth on Extremity: [Left:No] [Right:No] Temperature of Extremity: [Left:Warm] [Right:Warm] Capillary Refill: [Left:< 3 seconds] [Right:< 3 seconds] Electronic Signature(s) Signed: 11/24/2016 5:04:35 PM By: Curtis Sites Entered By: Curtis Sites on 11/24/2016 14:04:50 Tuggle, Jaislyn ([id number]) -------------------------------------------------------------------------------- Multi Wound Chart Details Patient Name: [name] Date of Service: 11/24/2016 1:30 PM Medical Record Patient Account Number: [id number] Number: Treating RN: Curtis Sites 21-May-1935 (80 y.o. Other Clinician: Date of Birth/Sex: Female) Treating ROBSON, [name] Primary Care  Nelsie Domino: Duncan Dull Rhoda Waldvogel/Extender: G Referring Pelham Hennick: Denton Brick in Treatment: 14 Vital Signs Height(in): 66 Pulse(bpm): 61 Weight(lbs): 140.8 Blood Pressure 119/54 (mmHg): Body Mass Index(BMI): 23 Temperature(F): 97.8 Respiratory Rate 18 (breaths/min): Photos: [1:No Photos] [2:No Photos] [N/A:N/A] Wound Location: [1:Right Calcaneus] [2:Left Calcaneus] [N/A:N/A] Wounding Event: [1:Pressure Injury] [2:Pressure Injury] [N/A:N/A] Primary Etiology: [1:Pressure Ulcer] [2:Pressure Ulcer] [N/A:N/A] Comorbid History: [1:Cataracts, Anemia, Congestive Heart Failure, Congestive Heart Failure, Hypertension, Type II Diabetes, Gout, Dementia, Diabetes, Gout, Dementia, Neuropathy] [2:Cataracts, Anemia, Hypertension, Type II Neuropathy] [N/A:N/A] Date Acquired: [1:07/05/2016] [2:07/05/2016] [N/A:N/A] Weeks of Treatment: [1:14] [2:14] [  N/A:N/A] Wound Status: [1:Open] [2:Open] [N/A:N/A] Measurements L x W x D 0.8x1.8x0.2 [2:1.6x2.4x0.1] [N/A:N/A] (cm) Area (cm) : [1:1.131] [2:3.016] [N/A:N/A] Volume (cm) : [1:0.226] [2:0.302] [N/A:N/A] % Reduction in Area: [1:84.00%] [2:72.60%] [N/A:N/A] % Reduction in Volume: 84.00% [2:86.30%] [N/A:N/A] Classification: [1:Category/Stage III] [2:Category/Stage III] [N/A:N/A] HBO Classification: [1:Grade 1] [2:Grade 1] [N/A:N/A] Exudate Amount: [1:Large] [2:Large] [N/A:N/A] Exudate Type: [1:Serous] [2:Serous] [N/A:N/A] Exudate Color: [1:amber] [2:amber] [N/A:N/A] Foul Odor After [1:Yes] [2:Yes] [N/A:N/A] Cleansing: Odor Anticipated Due to No [2:No] [N/A:N/A] Product Use: Wound Margin: [1:Flat and Intact] [2:Flat and Intact] [N/A:N/A] Granulation Amount: Large (67-100%) Large (67-100%) N/A Granulation Quality: Pink Pink N/A Necrotic Amount: Small (1-33%) Small (1-33%) N/A Necrotic Tissue: Eschar, Adherent Slough Eschar, Adherent Slough N/A Exposed Structures: Fascia: No Fascia: No N/A Fat Layer (Subcutaneous Fat Layer  (Subcutaneous Tissue) Exposed: No Tissue) Exposed: No Tendon: No Tendon: No Muscle: No Muscle: No Joint: No Joint: No Bone: No Bone: No Limited to Skin Limited to Skin Breakdown Breakdown Epithelialization: None None N/A Periwound Skin Texture: Excoriation: No Excoriation: No N/A Induration: No Induration: No Callus: No Callus: No Crepitus: No Crepitus: No Rash: No Rash: No Scarring: No Scarring: No Periwound Skin Maceration: No Maceration: No N/A Moisture: Dry/Scaly: No Dry/Scaly: No Periwound Skin Color: Atrophie Blanche: No Atrophie Blanche: No N/A Cyanosis: No Cyanosis: No Ecchymosis: No Ecchymosis: No Erythema: No Erythema: No Hemosiderin Staining: No Hemosiderin Staining: No Mottled: No Mottled: No Pallor: No Pallor: No Rubor: No Rubor: No Temperature: No Abnormality No Abnormality N/A Tenderness on Yes Yes N/A Palpation: Wound Preparation: Ulcer Cleansing: Ulcer Cleansing: N/A Rinsed/Irrigated with Rinsed/Irrigated with Saline Saline Topical Anesthetic Topical Anesthetic Applied: Other: lidocaine Applied: Other: lidocaine 4% 4% Treatment Notes Electronic Signature(s) Signed: 11/25/2016 3:50:17 PM By: Baltazar Najjarobson, [name] MD Entered By: Baltazar Najjarobson, [name] on 11/24/2016 14:31:59 Ravelo, Raylinn ([id number]) -------------------------------------------------------------------------------- Multi-Disciplinary Care Plan Details Patient Name: [name], [name] Date of Service: 11/24/2016 1:30 PM Medical Record Patient Account Number: [id number] Number: Treating RN: Curtis Sitesorthy, Joanna Jun 14, 1935 (80 y.o. Other Clinician: Date of Birth/Sex: Female) Treating ROBSON, [name] Primary Care Papa [place]: Duncan Dullullo, Teresa Thales Knipple/Extender: G Referring Jazell Rosenau: Denton Brickullo, Teresa Weeks in Treatment: 14 Active Inactive ` Abuse / Safety / Falls / Self Care Management Nursing Diagnoses: Impaired physical mobility Potential for falls Goals: Patient will remain injury  free Date Initiated: 08/18/2016 Target Resolution Date: 10/29/2016 Goal Status: Active Interventions: Assess fall risk on admission and as needed Notes: ` Nutrition Nursing Diagnoses: Potential for alteratiion in Nutrition/Potential for imbalanced nutrition Goals: Patient/caregiver agrees to and verbalizes understanding of need to use nutritional supplements and/or vitamins as prescribed Date Initiated: 08/18/2016 Target Resolution Date: 10/29/2016 Goal Status: Active Interventions: Assess patient nutrition upon admission and as needed per policy Notes: ` Orientation to the Wound Care Program King[place]HE[place], [name] ([id number]) Nursing Diagnoses: Knowledge deficit related to the wound healing center program Goals: Patient/caregiver will verbalize understanding of the Wound Healing Center Program Date Initiated: 08/18/2016 Target Resolution Date: 10/29/2016 Goal Status: Active Interventions: Provide education on orientation to the wound center Notes: ` Wound/Skin Impairment Nursing Diagnoses: Impaired tissue integrity Goals: Patient/caregiver will verbalize understanding of skin care regimen Date Initiated: 08/18/2016 Target Resolution Date: 10/29/2016 Goal Status: Active Ulcer/skin breakdown will have a volume reduction of 30% by week 4 Date Initiated: 08/18/2016 Target Resolution Date: 10/29/2016 Goal Status: Active Ulcer/skin breakdown will have a volume reduction of 50% by week 8 Date Initiated: 08/18/2016 Target Resolution Date: 10/29/2016 Goal Status: Active Ulcer/skin breakdown will have a volume reduction of 80% by week 12 Date Initiated: 08/18/2016  Target Resolution Date: 10/29/2016 Goal Status: Active Ulcer/skin breakdown will heal within 14 weeks Date Initiated: 08/18/2016 Target Resolution Date: 10/29/2016 Goal Status: Active Interventions: Assess patient/caregiver ability to obtain necessary supplies Assess patient/caregiver ability to perform ulcer/skin care regimen upon  admission and as needed Assess ulceration(s) every visit Notes: Electronic Signature(s) Signed: 11/24/2016 5:04:35 PM By: Charlotta Newton, Kess ([id number]) Entered By: Curtis Sites on 11/24/2016 14:05:14 Voth, Merlene ([id number]) -------------------------------------------------------------------------------- Pain Assessment Details Patient Name: [name] Date of Service: 11/24/2016 1:30 PM Medical Record Patient Account Number: [id number] Number: Treating RN: Curtis Sites [birth year] (80 y.o. Other Clinician: Date of Birth/Sex: Female) Treating ROBSON, [name] Primary Care Amaurie Wandel: Duncan Dull Marieclaire Bettenhausen/Extender: G Referring Aurilla Coulibaly: Denton Brick in Treatment: 14 Active Problems Location of Pain Severity and Description of Pain Patient Has Paino No Site Locations Pain Management and Medication Current Pain Management: Notes Topical or injectable lidocaine is offered to patient for acute pain when surgical debridement is performed. If needed, Patient is instructed to use over the counter pain medication for the following 24-48 hours after debridement. Wound care MDs do not prescribed pain medications. Patient has chronic pain or uncontrolled pain. Patient has been instructed to make an appointment with their Primary Care Physician for pain management. Electronic Signature(s) Signed: 11/24/2016 5:04:35 PM By: Curtis Sites Entered By: Curtis Sites on 11/24/2016 13:53:19 Zabawa, Lovely ([id number]) -------------------------------------------------------------------------------- Patient/Caregiver Education Details Patient Name: [name] Date of Service: 11/24/2016 1:30 PM Medical Record Patient Account Number: [id number] Number: Treating RN: Curtis Sites 09-Jul-1935 (80 y.o. Other Clinician: Date of Birth/Gender: Female) Treating ROBSON, [name] Primary Care Physician/Extender: Virgil Benedict Physician: Tania Ade in  Treatment: 14 Referring Physician: Duncan Dull Education Assessment Education Provided To: Caregiver SNF staff via written orders Education Topics Provided Wound/Skin Impairment: Handouts: Other: wound care orders Methods: Printed Electronic Signature(s) Signed: 11/24/2016 5:04:35 PM By: Curtis Sites Entered By: Curtis Sites on 11/24/2016 14:06:48 Sollenberger, Samayah ([id number]) -------------------------------------------------------------------------------- Wound Assessment Details Patient Name: [name] Date of Service: 11/24/2016 1:30 PM Medical Record Patient Account Number: [id number] Number: Treating RN: Curtis Sites Jun 04, 1935 (80 y.o. Other Clinician: Date of Birth/Sex: Female) Treating ROBSON, [name] Primary Care Yoseline Andersson: Duncan Dull Wymon Swaney/Extender: G Referring Jonthan Leite: Denton Brick in Treatment: 14 Wound Status Wound Number: 1 Primary Pressure Ulcer Etiology: Wound Location: Right Calcaneus Wound Open Wounding Event: Pressure Injury Status: Date Acquired: 07/05/2016 Comorbid Cataracts, Anemia, Congestive Heart Weeks Of Treatment: 14 History: Failure, Hypertension, Type II Diabetes, Clustered Wound: No Gout, Dementia, Neuropathy Photos Photo Uploaded By: Curtis Sites on 11/24/2016 16:02:23 Wound Measurements Length: (cm) 0.8 Width: (cm) 1.8 Depth: (cm) 0.2 Area: (cm) 1.131 Volume: (cm) 0.226 % Reduction in Area: 84% % Reduction in Volume: 84% Epithelialization: None Tunneling: No Undermining: No Wound Description Classification: Category/Stage III Foul Odor Afte Diabetic Severity (Wagner): Grade 1 Due to Product Wound Margin: Flat and Intact Slough/Fibrino Exudate Amount: Large Exudate Type: Serous Exudate Color: amber r Cleansing: Yes Use: No No Wound Bed Granulation Amount: Large (67-100%) Exposed Structure Cremeens, Leasa ([id number]) Granulation Quality: Pink Fascia Exposed: No Necrotic Amount: Small  (1-33%) Fat Layer (Subcutaneous Tissue) Exposed: No Necrotic Quality: Eschar, Adherent Slough Tendon Exposed: No Muscle Exposed: No Joint Exposed: No Bone Exposed: No Limited to Skin Breakdown Periwound Skin Texture Texture Color No Abnormalities Noted: No No Abnormalities Noted: No Callus: No Atrophie Blanche: No Crepitus: No Cyanosis: No Excoriation: No Ecchymosis: No Induration: No Erythema: No Rash: No Hemosiderin Staining: No Scarring: No Mottled: No Pallor: No Moisture Rubor: No  No Abnormalities Noted: No Dry / Scaly: No Temperature / Pain Maceration: No Temperature: No Abnormality Tenderness on Palpation: Yes Wound Preparation Ulcer Cleansing: Rinsed/Irrigated with Saline Topical Anesthetic Applied: Other: lidocaine 4%, Treatment Notes Wound #1 (Right Calcaneus) 1. Cleansed with: Clean wound with Normal Saline 2. Anesthetic Topical Lidocaine 4% cream to wound bed prior to debridement 4. Dressing Applied: Hydrafera Blue 5. Secondary Dressing Applied Dry Gauze Foam Kerlix/Conform 7. Secured with Tape Notes heel cups, stretch netting Electronic Signature(s) Signed: 11/24/2016 5:04:35 PM By: Charlotta Newtonorthy, Joanna Fetterolf, Vickey ([id number]) Entered By: Curtis Sitesorthy, Joanna on 11/24/2016 14:04:15 Muffley, Santos ([id number]) -------------------------------------------------------------------------------- Wound Assessment Details Patient Name: [name], [name] Date of Service: 11/24/2016 1:30 PM Medical Record Patient Account Number: [id number] Number: Treating RN: Curtis SitesDorthy, Joanna 1936/04/07 (80 y.o. Other Clinician: Date of Birth/Sex: Female) Treating ROBSON, [name] Primary Care Brazen Domangue: Duncan Dullullo, Teresa Kylee Umana/Extender: G Referring Hadiyah Maricle: Denton Brickullo, Teresa Weeks in Treatment: 14 Wound Status Wound Number: 2 Primary Pressure Ulcer Etiology: Wound Location: Left Calcaneus Wound Open Wounding Event: Pressure Injury Status: Date Acquired:  07/05/2016 Comorbid Cataracts, Anemia, Congestive Heart Weeks Of Treatment: 14 History: Failure, Hypertension, Type II Diabetes, Clustered Wound: No Gout, Dementia, Neuropathy Photos Photo Uploaded By: Curtis Sitesorthy, Joanna on 11/24/2016 16:02:24 Wound Measurements Length: (cm) 1.6 Width: (cm) 2.4 Depth: (cm) 0.1 Area: (cm) 3.016 Volume: (cm) 0.302 % Reduction in Area: 72.6% % Reduction in Volume: 86.3% Epithelialization: None Tunneling: No Undermining: No Wound Description Classification: Category/Stage III Foul Odor Afte Diabetic Severity (Wagner): Grade 1 Due to Product Wound Margin: Flat and Intact Slough/Fibrino Exudate Amount: Large Exudate Type: Serous Exudate Color: amber r Cleansing: Yes Use: No No Wound Bed Granulation Amount: Large (67-100%) Exposed Structure Settlemyre, Kellie ([id number]) Granulation Quality: Pink Fascia Exposed: No Necrotic Amount: Small (1-33%) Fat Layer (Subcutaneous Tissue) Exposed: No Necrotic Quality: Eschar, Adherent Slough Tendon Exposed: No Muscle Exposed: No Joint Exposed: No Bone Exposed: No Limited to Skin Breakdown Periwound Skin Texture Texture Color No Abnormalities Noted: No No Abnormalities Noted: No Callus: No Atrophie Blanche: No Crepitus: No Cyanosis: No Excoriation: No Ecchymosis: No Induration: No Erythema: No Rash: No Hemosiderin Staining: No Scarring: No Mottled: No Pallor: No Moisture Rubor: No No Abnormalities Noted: No Dry / Scaly: No Temperature / Pain Maceration: No Temperature: No Abnormality Tenderness on Palpation: Yes Wound Preparation Ulcer Cleansing: Rinsed/Irrigated with Saline Topical Anesthetic Applied: Other: lidocaine 4%, Treatment Notes Wound #2 (Left Calcaneus) 1. Cleansed with: Clean wound with Normal Saline 2. Anesthetic Topical Lidocaine 4% cream to wound bed prior to debridement 4. Dressing Applied: Hydrafera Blue 5. Secondary Dressing Applied Dry  Gauze Foam Kerlix/Conform 7. Secured with Tape Notes heel cups, stretch netting Electronic Signature(s) Signed: 11/24/2016 5:04:35 PM By: Charlotta Newtonorthy, Joanna Ashland, Obie ([id number]) Entered By: Curtis Sitesorthy, Joanna on 11/24/2016 14:04:30 Reesor, Marae ([id number]) -------------------------------------------------------------------------------- Vitals Details Patient Name: [name], Nazia Date of Service: 11/24/2016 1:30 PM Medical Record Patient Account Number: [id number] Number: Treating RN: Curtis SitesDorthy, Joanna 1936/04/07 (80 y.o. Other Clinician: Date of Birth/Sex: Female) Treating ROBSON, [name] Primary Care Seira Cody: Duncan Dullullo, Teresa Arlita Buffkin/Extender: G Referring Sherol Sabas: Denton Brickullo, Teresa Weeks in Treatment: 14 Vital Signs Time Taken: 13:53 Temperature (F): 97.8 Height (in): 66 Pulse (bpm): 61 Weight (lbs): 140.8 Respiratory Rate (breaths/min): 18 Body Mass Index (BMI): 22.7 Blood Pressure (mmHg): 119/54 Reference Range: 80 - 120 mg / dl Electronic Signature(s) Signed: 11/24/2016 5:04:35 PM By: Curtis Sitesorthy, Joanna Entered By: Curtis Sitesorthy, Joanna on 11/24/2016 13:53:44

## 2016-12-08 ENCOUNTER — Encounter: Payer: Medicare (Managed Care) | Admitting: Physician Assistant

## 2016-12-08 DIAGNOSIS — L89623 Pressure ulcer of left heel, stage 3: Secondary | ICD-10-CM | POA: Diagnosis not present

## 2016-12-09 ENCOUNTER — Ambulatory Visit: Admit: 2016-12-09 | Payer: Medicaid Other | Admitting: Ophthalmology

## 2016-12-09 SURGERY — PHACOEMULSIFICATION, CATARACT, WITH IOL INSERTION
Anesthesia: Choice | Laterality: Left

## 2016-12-10 NOTE — Progress Notes (Signed)
[name], [name] Visit Report for 12/08/2016 Chief Complaint Document Details Patient Name: [name], [name] 12/08/2016 11:00 Date of Service: AM Medical Record [id number] Number: Patient Account Number: [id number] 01/19/1936 (81 y.o. Treating RN: Curtis Sites Date of Birth/Sex: Female) Other Clinician: Primary Care Provider: Duncan Dull Treating STONE III, HOYT Referring Provider: Duncan Dull Provider/Extender: Weeks in Treatment: 16 Information Obtained from: Patient Chief Complaint 08/18/16; patient is here for review of bilateral heel ulcers Electronic Signature(s) Signed: 12/09/2016 12:37:32 AM By: Lenda Kelp PA-C Entered By: Lenda Kelp on 12/08/2016 11:35:58 Mott, Ambera ([id number]) -------------------------------------------------------------------------------- Debridement Details Patient Name: [name], [name] 12/08/2016 11:00 Date of Service: AM Medical Record [id number] Number: Patient Account Number: [id number] 25-Sep-1935 (80 y.o. Treating RN: Curtis Sites Date of Birth/Sex: Female) Other Clinician: Primary Care Provider: Duncan Dull Treating STONE III, HOYT Referring Provider: Duncan Dull Provider/Extender: Weeks in Treatment: 16 Debridement Performed for Wound #2 Left Calcaneus Assessment: Performed By: Physician STONE III, HOYT E., PA-C Debridement: Debridement Pre-procedure Verification/Time Out Yes - 11:36 Taken: Start Time: 11:36 Pain Control: Lidocaine 4% Topical Solution Level: Skin/Subcutaneous Tissue Total Area Debrided (L x 1.2 (cm) x 2 (cm) = 2.4 (cm) W): Tissue and other Viable, Non-Viable, Callus, Fibrin/Slough, Subcutaneous material debrided: Instrument: Curette Bleeding: Minimum Hemostasis Achieved: Pressure End Time: 11:40 Procedural Pain: 0 Post Procedural Pain: 0 Response to Treatment: Procedure was tolerated well Post Debridement Measurements of Total Wound Length: (cm) 1.2 Stage: Category/Stage  III Width: (cm) 2 Depth: (cm) 0.2 Volume: (cm) 0.377 Character of Wound/Ulcer Post Improved Debridement: Post Procedure Diagnosis Same as Pre-procedure Electronic Signature(s) Signed: 12/08/2016 4:53:16 PM By: Curtis Sites Signed: 12/09/2016 12:37:32 AM By: Lenda Kelp PA-C Entered By: Curtis Sites on 12/08/2016 11:42:49 [name], [name] ([id number]) [place], [name] ([id number]) -------------------------------------------------------------------------------- Debridement Details Patient Name: MYCHELLE, KENDRA 12/08/2016 11:00 Date of Service: AM Medical Record [id number] Number: Patient Account Number: [id number] 1936-03-03 (80 y.o. Treating RN: Curtis Sites Date of Birth/Sex: Female) Other Clinician: Primary Care Provider: Duncan Dull Treating STONE III, HOYT Referring Provider: Duncan Dull Provider/Extender: Weeks in Treatment: 16 Debridement Performed for Wound #1 Right Calcaneus Assessment: Performed By: Physician STONE III, HOYT E., PA-C Debridement: Debridement Pre-procedure Verification/Time Out Yes - 11:40 Taken: Start Time: 11:40 Pain Control: Lidocaine 4% Topical Solution Level: Skin/Subcutaneous Tissue Total Area Debrided (L x 0.5 (cm) x 1.4 (cm) = 0.7 (cm) W): Tissue and other Viable, Non-Viable, Callus, Fibrin/Slough, Subcutaneous material debrided: Instrument: Curette Bleeding: Minimum Hemostasis Achieved: Pressure End Time: 11:43 Procedural Pain: 0 Post Procedural Pain: 0 Response to Treatment: Procedure was tolerated well Post Debridement Measurements of Total Wound Length: (cm) 0.5 Stage: Category/Stage III Width: (cm) 1.4 Depth: (cm) 0.2 Volume: (cm) 0.11 Character of Wound/Ulcer Post Improved Debridement: Post Procedure Diagnosis Same as Pre-procedure Electronic Signature(s) Signed: 12/08/2016 4:53:16 PM By: Curtis Sites Signed: 12/09/2016 12:37:32 AM By: Lenda Kelp PA-C Entered By: Curtis Sites on 12/08/2016  11:43:39 [place], [place] ([id number]) [place], [facility] ([id number]) -------------------------------------------------------------------------------- HPI Details Patient Name: [name], [name] 12/08/2016 11:00 Date of Service: AM Medical Record [id number] Number: Patient Account Number: [id number] Feb 19, 1936 (80 y.o. Treating RN: Curtis Sites Date of Birth/Sex: Female) Other Clinician: Primary Care Provider: Duncan Dull Treating STONE III, HOYT Referring Provider: Duncan Dull Provider/Extender: Weeks in Treatment: 16 History of Present Illness HPI Description: 08/18/16; this is a 81 year old woman who is a type II diabetic not currently on any treatment. She is also listed as having Alzheimer's disease hypertension. She has a history of chronic venous insufficiency with leg wounds in the past  but no history of wounds in her feet. In terms of her diabetes at one point she was on insulin however she is no longer on any current treatment, her daughter who is providing most of the history is unaware of her hemoglobin A1c. She has stage IV chronic renal failure and follows with nephrology. The history provided by her daughter is that she has had a wound on the left heel perhaps dating back to last May/17. At that point she was in the hospital predominantly with psychiatric issues and was [facility]. This was treated with some form of foam dressing and may have actually healed however she was readmitted to hospital in January with Escherichia coli sepsis felt to be secondary to a UTI. Since then she is in [facility] skilled facility. Apparently this timeframe both the left heel and right heel reopened or at least the daughter became aware that they were both open. The exact timeframe isn't really certain Her ABIs in this clinic were 1.08 on the right 1.25 on the left. Looking through [employer] doesn't really provide any useful information. She did have venous  ultrasounds in 2016 that did not show a DVT. I don't see a recent hemoglobin A1c 08/25/16; from last week we have been using Santyl to both heels. Apparently the nursing home didn't get an x-ray of both heels [[facility]] and the daughter states that there was "no injury to bone" but for some reason we haven't been able to get the official report from them. 09/01/16; continued improvement in both wounds on her bilateral heels using Santyl. X-ray report was negative for osteomyelitis 09/08/16; patient from [facility] skilled facility. Using Santyl on both her heel wounds which are fortunately not on the plantar surface. 09/15/16; patient is from [facility] skilled facility. She has been using Santyl on both her pressure areas which were stage III. Fortunately these or not on the plantar surface. 09/22/16; patient arrives today with a much less viable looking surface on her heels. We had been using Santyl with good improvement however unchanged either fair last week. This may be unrelated however she required debridement today. 09/29/16; patient arrives with a better looking surface on the left heel unfortunately this is a more substantial wound. The area on the right lateral calcaneus again covered in a nonviable necrotic surface. We have been making good progress with Santyl. I think I'm going to need to go back to a debriding agent. 10/06/16; still nonviable surface material over the right greater than the left heel. Apparently the facility where this woman resides did not have Iodoflex so rather than calling the clinic they decided to make some cocktail of material and then settled on Hydrofera Blue. Patient is really generally made pretty good progress versus when she came in her first however still has too much nonviable surface especially on the [facility], [place] ([id number]) right. 10/13/16; she arrives today with Iodoflex on both heels. This was the dressing that we couldn't seem to  get last week. Her daughter tells [place] that where they were traveling to [place] last weekend she actually use Santyl. Both wounds are in better condition today and didn't seem to require debridement however I'm not exactly sure what is been most helpful 10/20/16; using Santyl to both heels better condition. Nursing reports green drainage 11/03/16 patient wounds appeared to be doing fairly well on evaluation today she seen along with her daughter in the office today. She does have slough covering the wound  unfortunately no evidence of infection. 11/10/16; both wounds appear to be making nice progress. This is on the bilateral heels 11/24/16; he also continued to be making nice progress. Wounds are smaller. Using Hydrofera Blue. Orders are to change daily in the nursing home although according to her daughter they're actually being changed to her 3 times a week which is probably satisfactory 12/08/16 on evaluation today patient bilateral hills appear to be making improvement. Improvement is somewhat slow but nonetheless week by week we are noting some change. Overall I'm pleased with the progress that we have seen. Nonetheless patient states that she really would like for this to already be completely healed and closed which obviously it is not. She wonders how long it's gonna be before she can get back to normal walking and wearing shoes. No fevers, chills, nausea, or vomiting noted at this time. Secondary to mental status patient is unable to rate or describe her pain. Electronic Signature(s) Signed: 12/09/2016 12:37:32 AM By: Lenda Kelp PA-C Entered By: Lenda Kelp on 12/08/2016 23:55:59 [name], [name] -------------------------------------------------------------------------------- Physical Exam Details Patient Name: [name], [name] 12/08/2016 11:00 Date of Service: AM Medical Record [id number] Number: Patient Account Number: [id number] 1935/05/14 (80 y.o. Treating RN:  Curtis Sites Date of Birth/Sex: Female) Other Clinician: Primary Care Provider: Duncan Dull Treating STONE III, HOYT Referring Provider: Duncan Dull Provider/Extender: Weeks in Treatment: 16 Constitutional Well-nourished and well-hydrated in no acute distress. Respiratory normal breathing without difficulty. clear to auscultation bilaterally. Cardiovascular regular rate and rhythm with normal S1, S2. Psychiatric Patient is not able to cooperate in decision making regarding care. Patient is oriented to person only. pleasant and cooperative. Notes Patient's wounds appeared to be somewhat hyper granular but otherwise there is no significant finding definitely note purulent discharge and no evidence of infection touches erythema surrounding the wound. Electronic Signature(s) Signed: 12/09/2016 12:37:32 AM By: Lenda Kelp PA-C Entered By: Lenda Kelp on 12/08/2016 23:56:26 [place], Meera ([id number]) -------------------------------------------------------------------------------- Physician Orders Details Patient Name: [name], [name] 12/08/2016 11:00 Date of Service: AM Medical Record [id number] Number: Patient Account Number: [id number] 09-19-1935 (80 y.o. Treating RN: Curtis Sites Date of Birth/Sex: Female) Other Clinician: Primary Care Provider: Duncan Dull Treating STONE III, HOYT Referring Provider: Duncan Dull Provider/Extender: Weeks in Treatment: 16 Verbal / Phone Orders: No Diagnosis Coding ICD-10 Coding Code Description E11.621 Type 2 diabetes mellitus with foot ulcer L89.623 Pressure ulcer of left heel, stage 3 L89.613 Pressure ulcer of right heel, stage 3 Wound Cleansing Wound #1 Right Calcaneus o Clean wound with Normal Saline. o May Shower, gently pat wound dry prior to applying new dressing. Wound #2 Left Calcaneus o Clean wound with Normal Saline. o May Shower, gently pat wound dry prior to applying new  dressing. Anesthetic Wound #1 Right Calcaneus o Topical Lidocaine 4% cream applied to wound bed prior to debridement Wound #2 Left Calcaneus o Topical Lidocaine 4% cream applied to wound bed prior to debridement Primary Wound Dressing Wound #1 Right Calcaneus o Hydrafera Blue Wound #2 Left Calcaneus o Hydrafera Blue Secondary Dressing Wound #1 Right Calcaneus o Dry Gauze o Conform/Kerlix o Foam - heel cups [place], Wai ([id number]) o Other - stretch netting #4 (please order for pt...daughter is aware) Wound #2 Left Calcaneus o Dry Gauze o Conform/Kerlix o Foam - heel cups o Other - stretch netting #4 (please order for pt...daughter is aware) Dressing Change Frequency Wound #1 Right Calcaneus o Change dressing every day. Wound #2 Left Calcaneus o Change dressing every day.  Follow-up Appointments Wound #1 Right Calcaneus o Return Appointment in 2 weeks. Wound #2 Left Calcaneus o Return Appointment in 2 weeks. Edema Control Wound #1 Right Calcaneus o Elevate legs to the level of the heart and pump ankles as often as possible Wound #2 Left Calcaneus o Elevate legs to the level of the heart and pump ankles as often as possible Off-Loading Wound #1 Right Calcaneus o Turn and reposition every 2 hours o Other: - wear heel protector boots at al times in bed, float heels while in bed, Wound #2 Left Calcaneus o Turn and reposition every 2 hours o Other: - wear heel protector boots at al times in bed, float heels while in bed, Additional Orders / Instructions Wound #1 Right Calcaneus o Increase protein intake. o Other: - please add vitamin A, vitamin C and zinc supplements to your diet Wound #2 Left Calcaneus o Increase protein intake. o Other: - please add vitamin A, vitamin C and zinc supplements to your diet [name], [name] Electronic Signature(s) Signed: 12/08/2016 4:53:16 PM By: Curtis Sites Signed:  12/09/2016 12:37:32 AM By: Lenda Kelp PA-C Entered By: Curtis Sites on 12/08/2016 11:45:35 [place], Jo ([id number]) -------------------------------------------------------------------------------- Problem List Details Patient Name: [name], [name] 12/08/2016 11:00 Date of Service: AM Medical Record [id number] Number: Patient Account Number: [id number] 03-22-1936 (80 y.o. Treating RN: Curtis Sites Date of Birth/Sex: Female) Other Clinician: Primary Care Provider: Duncan Dull Treating STONE III, HOYT Referring Provider: Duncan Dull Provider/Extender: Weeks in Treatment: 16 Active Problems ICD-10 Encounter Code Description Active Date Diagnosis E11.621 Type 2 diabetes mellitus with foot ulcer 08/18/2016 Yes L89.623 Pressure ulcer of left heel, stage 3 08/18/2016 Yes L89.613 Pressure ulcer of right heel, stage 3 08/18/2016 Yes Inactive Problems Resolved Problems Electronic Signature(s) Signed: 12/09/2016 12:37:32 AM By: Lenda Kelp PA-C Entered By: Lenda Kelp on 12/08/2016 11:35:27 [place], [place]zam[place] ([id number]) -------------------------------------------------------------------------------- Progress Note Details Patient Name: [name], SCHEIB 12/08/2016 11:00 Date of Service: AM Medical Record [id number] Number: Patient Account Number: [id number] 1935-07-15 (81 y.o. Treating RN: Curtis Sites Date of Birth/Sex: Female) Other Clinician: Primary Care Provider: Duncan Dull Treating STONE III, HOYT Referring Provider: Duncan Dull Provider/Extender: Weeks in Treatment: 16 Subjective Chief Complaint Information obtained from Patient 08/18/16; patient is here for review of bilateral heel ulcers History of Present Illness (HPI) 08/18/16; this is a 81 year old woman who is a type II diabetic not currently on any treatment. She is also listed as having Alzheimer's disease hypertension. She has a history of chronic venous insufficiency with leg wounds in the past  but no history of wounds in her feet. In terms of her diabetes at one point she was on insulin however she is no longer on any current treatment, her daughter who is providing most of the history is unaware of her hemoglobin A1c. She has stage IV chronic renal failure and follows with nephrology. The history provided by her daughter is that she has had a wound on the left heel perhaps dating back to last May/17. At that point she was in the hospital predominantly with psychiatric issues and was [facility]. This was treated with some form of foam dressing and may have actually healed however she was readmitted to hospital in January with Escherichia coli sepsis felt to be secondary to a UTI. Since then she is in [facility] skilled facility. Apparently this timeframe both the left heel and right heel reopened or at least the daughter became aware that they were both open. The exact  timeframe isn't really certain Her ABIs in this clinic were 1.08 on the right 1.25 on the left. Looking through [employer] doesn't really provide any useful information. She did have venous ultrasounds in 2016 that did not show a DVT. I don't see a recent hemoglobin A1c 08/25/16; from last week we have been using Santyl to both heels. Apparently the nursing home didn't get an x-ray of both heels [[facility]] and the daughter states that there was "no injury to bone" but for some reason we haven't been able to get the official report from them. 09/01/16; continued improvement in both wounds on her bilateral heels using Santyl. X-ray report was negative for osteomyelitis 09/08/16; patient from [facility] skilled facility. Using Santyl on both her heel wounds which are fortunately not on the plantar surface. 09/15/16; patient is from [facility] skilled facility. She has been using Santyl on both her pressure areas which were stage III. Fortunately these or not on the plantar  surface. 09/22/16; patient arrives today with a much less viable looking surface on her heels. We had been using Santyl with good improvement however unchanged either fair last week. This may be unrelated however she required debridement today. 09/29/16; patient arrives with a better looking surface on the left heel unfortunately this is a more substantial wound. The area on the right lateral calcaneus again covered in a nonviable necrotic surface. We have [name], [name] ([id number]) been making good progress with Santyl. I think I'm going to need to go back to a debriding agent. 10/06/16; still nonviable surface material over the right greater than the left heel. Apparently the facility where this woman resides did not have Iodoflex so rather than calling the clinic they decided to make some cocktail of material and then settled on Hydrofera Blue. Patient is really generally made pretty good progress versus when she came in her first however still has too much nonviable surface especially on the right. 10/13/16; she arrives today with Iodoflex on both heels. This was the dressing that we couldn't seem to get last week. Her daughter tells [place] that where they were traveling to [place] last weekend she actually use Santyl. Both wounds are in better condition today and didn't seem to require debridement however I'm not exactly sure what is been most helpful 10/20/16; using Santyl to both heels better condition. Nursing reports green drainage 11/03/16 patient wounds appeared to be doing fairly well on evaluation today she seen along with her daughter in the office today. She does have slough covering the wound unfortunately no evidence of infection. 11/10/16; both wounds appear to be making nice progress. This is on the bilateral heels 11/24/16; he also continued to be making nice progress. Wounds are smaller. Using Hydrofera Blue. Orders are to change daily in the nursing home although according to her  daughter they're actually being changed to her 3 times a week which is probably satisfactory 12/08/16 on evaluation today patient bilateral hills appear to be making improvement. Improvement is somewhat slow but nonetheless week by week we are noting some change. Overall I'm pleased with the progress that we have seen. Nonetheless patient states that she really would like for this to already be completely healed and closed which obviously it is not. She wonders how long it's gonna be before she can get back to normal walking and wearing shoes. No fevers, chills, nausea, or vomiting noted at this time. Secondary to mental status patient is unable to rate or describe her pain. Objective  Constitutional Well-nourished and well-hydrated in no acute distress. Vitals Time Taken: 11:18 AM, Height: 66 in, Weight: 140.8 lbs, BMI: 22.7, Temperature: 98.0 F, Pulse: 68 bpm, Respiratory Rate: 16 breaths/min, Blood Pressure: 109/46 mmHg. Respiratory normal breathing without difficulty. clear to auscultation bilaterally. Cardiovascular regular rate and rhythm with normal S1, S2. Psychiatric Patient is not able to cooperate in decision making regarding care. Patient is oriented to person only. [name], [name] ([id number]) pleasant and cooperative. General Notes: Patient's wounds appeared to be somewhat hyper granular but otherwise there is no significant finding definitely note purulent discharge and no evidence of infection touches erythema surrounding the wound. Integumentary (Hair, Skin) Wound #1 status is Open. Original cause of wound was Pressure Injury. The wound is located on the Right Calcaneus. The wound measures 0.5cm length x 1.4cm width x 0.1cm depth; 0.55cm^2 area and 0.055cm^3 volume. The wound is limited to skin breakdown. There is no tunneling or undermining noted. There is a large amount of serous drainage noted. The wound margin is flat and intact. There is large (67- 100%) pink  granulation within the wound bed. There is a small (1-33%) amount of necrotic tissue within the wound bed including Eschar and Adherent Slough. The periwound skin appearance did not exhibit: Callus, Crepitus, Excoriation, Induration, Rash, Scarring, Dry/Scaly, Maceration, Atrophie Blanche, Cyanosis, Ecchymosis, Hemosiderin Staining, Mottled, Pallor, Rubor, Erythema. Periwound temperature was noted as No Abnormality. The periwound has tenderness on palpation. Wound #2 status is Open. Original cause of wound was Pressure Injury. The wound is located on the Left Calcaneus. The wound measures 1.2cm length x 2cm width x 0.1cm depth; 1.885cm^2 area and 0.188cm^3 volume. The wound is limited to skin breakdown. There is no tunneling or undermining noted. There is a large amount of serous drainage noted. The wound margin is flat and intact. There is large (67- 100%) pink granulation within the wound bed. There is a small (1-33%) amount of necrotic tissue within the wound bed including Eschar and Adherent Slough. The periwound skin appearance did not exhibit: Callus, Crepitus, Excoriation, Induration, Rash, Scarring, Dry/Scaly, Maceration, Atrophie Blanche, Cyanosis, Ecchymosis, Hemosiderin Staining, Mottled, Pallor, Rubor, Erythema. Periwound temperature was noted as No Abnormality. The periwound has tenderness on palpation. Assessment Active Problems ICD-10 E11.621 - Type 2 diabetes mellitus with foot ulcer L89.623 - Pressure ulcer of left heel, stage 3 L89.613 - Pressure ulcer of right heel, stage 3 Procedures Wound #1 Pre-procedure diagnosis of Wound #1 is a Pressure Ulcer located on the Right Calcaneus . There was a Skin/Subcutaneous Tissue Debridement ([id number]) debridement with total area of 0.7 sq cm performed Truss, Orli ([id number]) by STONE III, HOYT E., PA-C. with the following instrument(s): Curette to remove Viable and Non-Viable tissue/material including Fibrin/Slough, Callus, and  Subcutaneous after achieving pain control using Lidocaine 4% Topical Solution. A time out was conducted at 11:40, prior to the start of the procedure. A Minimum amount of bleeding was controlled with Pressure. The procedure was tolerated well with a pain level of 0 throughout and a pain level of 0 following the procedure. Post Debridement Measurements: 0.5cm length x 1.4cm width x 0.2cm depth; 0.11cm^3 volume. Post debridement Stage noted as Category/Stage III. Character of Wound/Ulcer Post Debridement is improved. Post procedure Diagnosis Wound #1: Same as Pre-Procedure Wound #2 Pre-procedure diagnosis of Wound #2 is a Pressure Ulcer located on the Left Calcaneus . There was a Skin/Subcutaneous Tissue Debridement ([id number]) debridement with total area of 2.4 sq cm performed by STONE III, HOYT E., PA-C. with the following instrument(s):  Curette to remove Viable and Non-Viable tissue/material including Fibrin/Slough, Callus, and Subcutaneous after achieving pain control using Lidocaine 4% Topical Solution. A time out was conducted at 11:36, prior to the start of the procedure. A Minimum amount of bleeding was controlled with Pressure. The procedure was tolerated well with a pain level of 0 throughout and a pain level of 0 following the procedure. Post Debridement Measurements: 1.2cm length x 2cm width x 0.2cm depth; 0.377cm^3 volume. Post debridement Stage noted as Category/Stage III. Character of Wound/Ulcer Post Debridement is improved. Post procedure Diagnosis Wound #2: Same as Pre-Procedure Plan Wound Cleansing: Wound #1 Right Calcaneus: Clean wound with Normal Saline. May Shower, gently pat wound dry prior to applying new dressing. Wound #2 Left Calcaneus: Clean wound with Normal Saline. May Shower, gently pat wound dry prior to applying new dressing. Anesthetic: Wound #1 Right Calcaneus: Topical Lidocaine 4% cream applied to wound bed prior to debridement Wound #2 Left  Calcaneus: Topical Lidocaine 4% cream applied to wound bed prior to debridement Primary Wound Dressing: Wound #1 Right Calcaneus: Hydrafera Blue Wound #2 Left Calcaneus: Hydrafera Blue Secondary Dressing: Wound #1 Right Calcaneus: Farooq, Deanne ([id number]) Dry Gauze Conform/Kerlix Foam - heel cups Other - stretch netting #4 (please order for pt...daughter is aware) Wound #2 Left Calcaneus: Dry Gauze Conform/Kerlix Foam - heel cups Other - stretch netting #4 (please order for pt...daughter is aware) Dressing Change Frequency: Wound #1 Right Calcaneus: Change dressing every day. Wound #2 Left Calcaneus: Change dressing every day. Follow-up Appointments: Wound #1 Right Calcaneus: Return Appointment in 2 weeks. Wound #2 Left Calcaneus: Return Appointment in 2 weeks. Edema Control: Wound #1 Right Calcaneus: Elevate legs to the level of the heart and pump ankles as often as possible Wound #2 Left Calcaneus: Elevate legs to the level of the heart and pump ankles as often as possible Off-Loading: Wound #1 Right Calcaneus: Turn and reposition every 2 hours Other: - wear heel protector boots at al times in bed, float heels while in bed, Wound #2 Left Calcaneus: Turn and reposition every 2 hours Other: - wear heel protector boots at al times in bed, float heels while in bed, Additional Orders / Instructions: Wound #1 Right Calcaneus: Increase protein intake. Other: - please add vitamin A, vitamin C and zinc supplements to your diet Wound #2 Left Calcaneus: Increase protein intake. Other: - please add vitamin A, vitamin C and zinc supplements to your diet I'm going to recommend that we continue with the current wound care orders per above. We will see her for reevaluation in one week to see were things stand. If anything worsens in the interim she will contact our office for additional recommendations and otherwise I'm glad that we are continuing to show signs of improvement  when she is reevaluated every two weeks. Panaca, Ryleah ([id number]) Electronic Signature(s) Signed: 12/09/2016 12:37:32 AM By: Lenda Kelp PA-C Entered By: Lenda Kelp on 12/08/2016 23:57:00 [place], Soliana ([id number]) -------------------------------------------------------------------------------- [place] Details Patient Name: [name] Date of Service: 12/08/2016 Medical Record Number: [id number] Patient Account Number: [id number] Date of Birth/Sex: 1935/10/04 (80 y.o. Female) Treating RN: Curtis Sites Primary Care Provider: Duncan Dull Other Clinician: Referring Provider: Duncan Dull Treating Provider/Extender: Linwood Dibbles, HOYT Weeks in Treatment: 16 Diagnosis Coding ICD-10 Codes Code Description E11.621 Type 2 diabetes mellitus with foot ulcer L89.623 Pressure ulcer of left heel, stage 3 L89.613 Pressure ulcer of right heel, stage 3 Facility Procedures CPT4 Code: 62952841 Description: 11042 - DEB SUBQ TISSUE 20 SQ CM/<  ICD-10 Description Diagnosis L89.623 Pressure ulcer of left heel, stage 3 L89.613 Pressure ulcer of right heel, stage 3 Modifier: Quantity: 1 Physician Procedures CPT4 Code: [id number] Description: 11042 - WC PHYS SUBQ TISS 20 SQ CM ICD-10 Description Diagnosis L89.623 Pressure ulcer of left heel, stage 3 L89.613 Pressure ulcer of right heel, stage 3 Modifier: Quantity: 1 Electronic Signature(s) Signed: 12/09/2016 12:37:32 AM By: Lenda Kelp PA-C Entered By: Lenda Kelp on 12/08/2016 23:57:26

## 2016-12-10 NOTE — Progress Notes (Signed)
Seila, Liston Momina ([id number]) Visit Report for 12/08/2016 Arrival Information Details Patient Name: HELEM, REESOR Date of Service: 12/08/2016 11:00 AM Medical Record Number: [id number] Patient Account Number: [id number] Date of Birth/Sex: 01-Apr-1936 (81 y.o. Female) Treating RN: Curtis Sites Primary Care Estrellita Lasky: Duncan Dull Other Clinician: Referring Puanani Gene: Duncan Dull Treating Allisyn Kunz/Extender: Linwood Dibbles, HOYT Weeks in Treatment: 16 Visit Information History Since Last Visit Added or deleted any medications: No Patient Arrived: Walker Any new allergies or adverse reactions: No Arrival Time: 11:10 Had a fall or experienced change in No Accompanied By: dtr activities of daily living that may affect Transfer Assistance: None risk of falls: Patient Identification Verified: Yes Signs or symptoms of abuse/neglect since last No Secondary Verification Process Completed: Yes visito Patient Requires Transmission-Based No Hospitalized since last visit: No Precautions: Has Dressing in Place as Prescribed: Yes Patient Has Alerts: Yes Pain Present Now: No Patient Alerts: DM II Electronic Signature(s) Signed: 12/08/2016 4:53:16 PM By: Curtis Sites Entered By: Curtis Sites on 12/08/2016 11:16:57 Holtsclaw, Aireonna ([id number]) -------------------------------------------------------------------------------- Encounter Discharge Information Details Patient Name: [name] Date of Service: 12/08/2016 11:00 AM Medical Record Number: [id number] Patient Account Number: [id number] Date of Birth/Sex: 02-May-1935 (80 y.o. Female) Treating RN: Curtis Sites Primary Care Quinita Kostelecky: Duncan Dull Other Clinician: Referring Ethanjames Fontenot: Duncan Dull Treating Daysean Tinkham/Extender: Linwood Dibbles, HOYT Weeks in Treatment: 16 Encounter Discharge Information Items Discharge Pain Level: 0 Discharge Condition: Stable Ambulatory Status: Walker Discharge Destination: Nursing Home Transportation:  Private Auto Accompanied By: dtr Schedule Follow-up Appointment: Yes Medication Reconciliation completed No and provided to Patient/Care Dajanee Voorheis: Provided on Clinical Summary of Care: 12/08/2016 Form Type Recipient Paper Patient [facility] Electronic Signature(s) Signed: 12/08/2016 1:36:41 PM By: Curtis Sites Entered By: Curtis Sites on 12/08/2016 13:36:41 Parslow, Jeydi ([id number]) -------------------------------------------------------------------------------- Lower Extremity Assessment Details Patient Name: [name] Date of Service: 12/08/2016 11:00 AM Medical Record Number: [id number] Patient Account Number: [id number] Date of Birth/Sex: [birth year] (80 y.o. Female) Treating RN: Curtis Sites Primary Care Hodaya Curto: Duncan Dull Other Clinician: Referring Mardelle Pandolfi: Duncan Dull Treating Jordyan Hardiman/Extender: Linwood Dibbles, HOYT Weeks in Treatment: 16 Vascular Assessment Pulses: Dorsalis Pedis Palpable: [Left:Yes] [Right:Yes] Posterior Tibial Extremity colors, hair growth, and conditions: Extremity Color: [Left:Hyperpigmented] [Right:Hyperpigmented] Hair Growth on Extremity: [Left:No] [Right:No] Temperature of Extremity: [Left:Warm] [Right:Warm] Capillary Refill: [Left:< 3 seconds] [Right:< 3 seconds] Electronic Signature(s) Signed: 12/08/2016 4:53:16 PM By: Curtis Sites Entered By: Curtis Sites on 12/08/2016 11:30:27 Saathoff, Agape ([id number]) -------------------------------------------------------------------------------- Multi Wound Chart Details Patient Name: [name] Date of Service: 12/08/2016 11:00 AM Medical Record Number: [id number] Patient Account Number: [id number] Date of Birth/Sex: 08-24-1935 (80 y.o. Female) Treating RN: Curtis Sites Primary Care Isaiyah Feldhaus: Duncan Dull Other Clinician: Referring Lexxie Winberg: Duncan Dull Treating Mats Jeanlouis/Extender: Linwood Dibbles, HOYT Weeks in Treatment: 16 Vital Signs Height(in): 66 Pulse(bpm): 68 Weight(lbs):  140.8 Blood Pressure 109/46 (mmHg): Body Mass Index(BMI): 23 Temperature(F): 98.0 Respiratory Rate 16 (breaths/min): Photos: [1:No Photos] [2:No Photos] [N/A:N/A] Wound Location: [1:Right Calcaneus] [2:Left Calcaneus] [N/A:N/A] Wounding Event: [1:Pressure Injury] [2:Pressure Injury] [N/A:N/A] Primary Etiology: [1:Pressure Ulcer] [2:Pressure Ulcer] [N/A:N/A] Comorbid History: [1:Cataracts, Anemia, Congestive Heart Failure, Congestive Heart Failure, Hypertension, Type II Diabetes, Gout, Dementia, Diabetes, Gout, Dementia, Neuropathy] [2:Cataracts, Anemia, Hypertension, Type II Neuropathy] [N/A:N/A] Date Acquired: [1:07/05/2016] [2:07/05/2016] [N/A:N/A] Weeks of Treatment: [1:16] [2:16] [N/A:N/A] Wound Status: [1:Open] [2:Open] [N/A:N/A] Measurements L x W x D 0.5x1.4x0.1 [2:1.2x2x0.1] [N/A:N/A] (cm) Area (cm) : [1:0.55] [2:1.885] [N/A:N/A] Volume (cm) : [1:0.055] [2:0.188] [N/A:N/A] % Reduction in Area: [1:92.20%] [2:82.90%] [N/A:N/A] % Reduction in Volume: 96.10% [2:91.50%] [N/A:N/A] Classification: [1:Category/Stage III] [2:Category/Stage III] [N/A:N/A] HBO  Classification: [1:Grade 1] [2:Grade 1] [N/A:N/A] Exudate Amount: [1:Large] [2:Large] [N/A:N/A] Exudate Type: [1:Serous] [2:Serous] [N/A:N/A] Exudate Color: [1:amber] [2:amber] [N/A:N/A] Foul Odor After [1:Yes] [2:Yes] [N/A:N/A] Cleansing: Odor Anticipated Due to No [2:No] [N/A:N/A] Product Use: Wound Margin: [1:Flat and Intact] [2:Flat and Intact] [N/A:N/A] Granulation Amount: [1:Large (67-100%)] [2:Large (67-100%)] [N/A:N/A] Granulation Quality: [1:Pink] [2:Pink] [N/A:N/A] Necrotic Amount: Small (1-33%) Small (1-33%) N/A Necrotic Tissue: Eschar, Adherent Slough Eschar, Adherent Slough N/A Exposed Structures: Fascia: No Fascia: No N/A Fat Layer (Subcutaneous Fat Layer (Subcutaneous Tissue) Exposed: No Tissue) Exposed: No Tendon: No Tendon: No Muscle: No Muscle: No Joint: No Joint: No Bone: No Bone: No Limited  to Skin Limited to Skin Breakdown Breakdown Epithelialization: None None N/A Periwound Skin Texture: Excoriation: No Excoriation: No N/A Induration: No Induration: No Callus: No Callus: No Crepitus: No Crepitus: No Rash: No Rash: No Scarring: No Scarring: No Periwound Skin Maceration: No Maceration: No N/A Moisture: Dry/Scaly: No Dry/Scaly: No Periwound Skin Color: Atrophie Blanche: No Atrophie Blanche: No N/A Cyanosis: No Cyanosis: No Ecchymosis: No Ecchymosis: No Erythema: No Erythema: No Hemosiderin Staining: No Hemosiderin Staining: No Mottled: No Mottled: No Pallor: No Pallor: No Rubor: No Rubor: No Temperature: No Abnormality No Abnormality N/A Tenderness on Yes Yes N/A Palpation: Wound Preparation: Ulcer Cleansing: Ulcer Cleansing: N/A Rinsed/Irrigated with Rinsed/Irrigated with Saline Saline Topical Anesthetic Topical Anesthetic Applied: Other: lidocaine Applied: Other: lidocaine 4% 4% Treatment Notes Electronic Signature(s) Signed: 12/08/2016 4:53:16 PM By: Curtis Sites Entered By: Curtis Sites on 12/08/2016 11:41:39 Siefker, Kristine ([id number]) -------------------------------------------------------------------------------- Multi-Disciplinary Care Plan Details Patient Name: [name] Date of Service: 12/08/2016 11:00 AM Medical Record Number: [id number] Patient Account Number: [id number] Date of Birth/Sex: [birth year] (80 y.o. Female) Treating RN: Curtis Sites Primary Care Irvan Tiedt: Duncan Dull Other Clinician: Referring Tionne Dayhoff: Duncan Dull Treating Tonie Elsey/Extender: Linwood Dibbles, HOYT Weeks in Treatment: 16 Active Inactive ` Abuse / Safety / Falls / Self Care Management Nursing Diagnoses: Impaired physical mobility Potential for falls Goals: Patient will remain injury free Date Initiated: 08/18/2016 Target Resolution Date: 10/29/2016 Goal Status: Active Interventions: Assess fall risk on admission and as  needed Notes: ` Nutrition Nursing Diagnoses: Potential for alteratiion in Nutrition/Potential for imbalanced nutrition Goals: Patient/caregiver agrees to and verbalizes understanding of need to use nutritional supplements and/or vitamins as prescribed Date Initiated: 08/18/2016 Target Resolution Date: 10/29/2016 Goal Status: Active Interventions: Assess patient nutrition upon admission and as needed per policy Notes: ` Orientation to the Wound Care Program Nursing Diagnoses: KEILEE, DENMAN ([id number]) Knowledge deficit related to the wound healing center program Goals: Patient/caregiver will verbalize understanding of the Wound Healing Center Program Date Initiated: 08/18/2016 Target Resolution Date: 10/29/2016 Goal Status: Active Interventions: Provide education on orientation to the wound center Notes: ` Wound/Skin Impairment Nursing Diagnoses: Impaired tissue integrity Goals: Patient/caregiver will verbalize understanding of skin care regimen Date Initiated: 08/18/2016 Target Resolution Date: 10/29/2016 Goal Status: Active Ulcer/skin breakdown will have a volume reduction of 30% by week 4 Date Initiated: 08/18/2016 Target Resolution Date: 10/29/2016 Goal Status: Active Ulcer/skin breakdown will have a volume reduction of 50% by week 8 Date Initiated: 08/18/2016 Target Resolution Date: 10/29/2016 Goal Status: Active Ulcer/skin breakdown will have a volume reduction of 80% by week 12 Date Initiated: 08/18/2016 Target Resolution Date: 10/29/2016 Goal Status: Active Ulcer/skin breakdown will heal within 14 weeks Date Initiated: 08/18/2016 Target Resolution Date: 10/29/2016 Goal Status: Active Interventions: Assess patient/caregiver ability to obtain necessary supplies Assess patient/caregiver ability to perform ulcer/skin care regimen upon admission and as needed Assess ulceration(s) every visit Notes:  Electronic Signature(s) Signed: 12/08/2016 4:53:16 PM By: Curtis Sites Entered By: Curtis Sites on 12/08/2016 11:37:24 Deshazer, Triston ([id number]) Rennerdale, Alica ([id number]) -------------------------------------------------------------------------------- Pain Assessment Details Patient Name: [name] Date of Service: 12/08/2016 11:00 AM Medical Record Number: [id number] Patient Account Number: [id number] Date of Birth/Sex: 1935/07/30 (80 y.o. Female) Treating RN: Curtis Sites Primary Care Makilah Dowda: Duncan Dull Other Clinician: Referring Gery Sabedra: Duncan Dull Treating Kamarii Carton/Extender: Linwood Dibbles, HOYT Weeks in Treatment: 16 Active Problems Location of Pain Severity and Description of Pain Patient Has Paino No Site Locations Pain Management and Medication Current Pain Management: Notes Topical or injectable lidocaine is offered to patient for acute pain when surgical debridement is performed. If needed, Patient is instructed to use over the counter pain medication for the following 24-48 hours after debridement. Wound care MDs do not prescribed pain medications. Patient has chronic pain or uncontrolled pain. Patient has been instructed to make an appointment with their Primary Care Physician for pain management. Electronic Signature(s) Signed: 12/08/2016 4:53:16 PM By: Curtis Sites Entered By: Curtis Sites on 12/08/2016 11:17:59 Eubanks, Charlissa ([id number]) -------------------------------------------------------------------------------- Patient/Caregiver Education Details Patient Name: [name] Date of Service: 12/08/2016 11:00 AM Medical Record Number: [id number] Patient Account Number: [id number] Date of Birth/Gender: Mar 15, 1936 (80 y.o. Female) Treating RN: Curtis Sites Primary Care Physician: Duncan Dull Other Clinician: Referring Physician: Duncan Dull Treating Physician/Extender: Skeet Simmer in Treatment: 16 Education Assessment Education Provided To: Caregiver SNF nurse Education Topics  Provided Wound/Skin Impairment: Handouts: Other: wound care orders Methods: Clinical cytogeneticist) Signed: 12/08/2016 4:53:16 PM By: Curtis Sites Entered By: Curtis Sites on 12/08/2016 13:37:02 Jafari, Faigy ([id number]) -------------------------------------------------------------------------------- Wound Assessment Details Patient Name: [name] Date of Service: 12/08/2016 11:00 AM Medical Record Number: [id number] Patient Account Number: [id number] Date of Birth/Sex: 07-Apr-1936 (80 y.o. Female) Treating RN: Curtis Sites Primary Care Sarinity Dicicco: Duncan Dull Other Clinician: Referring Trayvion Embleton: Duncan Dull Treating Shondrea Steinert/Extender: Linwood Dibbles, HOYT Weeks in Treatment: 16 Wound Status Wound Number: 1 Primary Pressure Ulcer Etiology: Wound Location: Right Calcaneus Wound Open Wounding Event: Pressure Injury Status: Date Acquired: 07/05/2016 Comorbid Cataracts, Anemia, Congestive Heart Weeks Of Treatment: 16 History: Failure, Hypertension, Type II Diabetes, Clustered Wound: No Gout, Dementia, Neuropathy Photos Photo Uploaded By: Curtis Sites on 12/08/2016 13:38:16 Wound Measurements Length: (cm) 0.5 Width: (cm) 1.4 Depth: (cm) 0.1 Area: (cm) 0.55 Volume: (cm) 0.055 % Reduction in Area: 92.2% % Reduction in Volume: 96.1% Epithelialization: None Tunneling: No Undermining: No Wound Description Classification: Category/Stage III Diabetic Severity [address]): Grade 1 Wound Margin: Flat and Intact Exudate Amount: Large Exudate Type: Serous Exudate Color: amber Foul Odor After Cleansing: Yes Due to Product Use: No Slough/Fibrino No Wound Bed Granulation Amount: Large (67-100%) Exposed Structure Granulation Quality: Pink Fascia Exposed: No Necrotic Amount: Small (1-33%) Fat Layer (Subcutaneous Tissue) Exposed: No Uddin, Veverly ([id number]) Necrotic Quality: Eschar, Adherent Slough Tendon Exposed: No Muscle Exposed: No Joint Exposed:  No Bone Exposed: No Limited to Skin Breakdown Periwound Skin Texture Texture Color No Abnormalities Noted: No No Abnormalities Noted: No Callus: No Atrophie Blanche: No Crepitus: No Cyanosis: No Excoriation: No Ecchymosis: No Induration: No Erythema: No Rash: No Hemosiderin Staining: No Scarring: No Mottled: No Pallor: No Moisture Rubor: No No Abnormalities Noted: No Dry / Scaly: No Temperature / Pain Maceration: No Temperature: No Abnormality Tenderness on Palpation: Yes Wound Preparation Ulcer Cleansing: Rinsed/Irrigated with Saline Topical Anesthetic Applied: Other: lidocaine 4%, Treatment Notes Wound #1 (Right Calcaneus) 1. Cleansed with: Clean wound with Normal Saline 2. Anesthetic Topical Lidocaine 4% cream to  wound bed prior to debridement 4. Dressing Applied: Hydrafera Blue 5. Secondary Dressing Applied Guaze, ABD and kerlix/Conform 7. Secured with Tape Other (specify in notes) Notes netting Electronic Signature(s) Signed: 12/08/2016 4:53:16 PM By: Curtis Sites Entered By: Curtis Sites on 12/08/2016 11:29:11 Goebel, Meha ([id number]) -------------------------------------------------------------------------------- Wound Assessment Details Patient Name: [name] Date of Service: 12/08/2016 11:00 AM Medical Record Number: [id number] Patient Account Number: [id number] Date of Birth/Sex: 1936-02-05 (80 y.o. Female) Treating RN: Curtis Sites Primary Care Savon Bordonaro: Duncan Dull Other Clinician: Referring Sparkle Aube: Duncan Dull Treating Zora Glendenning/Extender: Linwood Dibbles, HOYT Weeks in Treatment: 16 Wound Status Wound Number: 2 Primary Pressure Ulcer Etiology: Wound Location: Left Calcaneus Wound Open Wounding Event: Pressure Injury Status: Date Acquired: 07/05/2016 Comorbid Cataracts, Anemia, Congestive Heart Weeks Of Treatment: 16 History: Failure, Hypertension, Type II Diabetes, Clustered Wound: No Gout, Dementia,  Neuropathy Photos Photo Uploaded By: Curtis Sites on 12/08/2016 13:38:17 Wound Measurements Length: (cm) 1.2 Width: (cm) 2 Depth: (cm) 0.1 Area: (cm) 1.885 Volume: (cm) 0.188 % Reduction in Area: 82.9% % Reduction in Volume: 91.5% Epithelialization: None Tunneling: No Undermining: No Wound Description Classification: Category/Stage III Diabetic Severity [address]): Grade 1 Wound Margin: Flat and Intact Exudate Amount: Large Exudate Type: Serous Exudate Color: amber Foul Odor After Cleansing: Yes Due to Product Use: No Slough/Fibrino No Wound Bed Granulation Amount: Large (67-100%) Exposed Structure Granulation Quality: Pink Fascia Exposed: No Necrotic Amount: Small (1-33%) Fat Layer (Subcutaneous Tissue) Exposed: No Madan, Ameka ([id number]) Necrotic Quality: Eschar, Adherent Slough Tendon Exposed: No Muscle Exposed: No Joint Exposed: No Bone Exposed: No Limited to Skin Breakdown Periwound Skin Texture Texture Color No Abnormalities Noted: No No Abnormalities Noted: No Callus: No Atrophie Blanche: No Crepitus: No Cyanosis: No Excoriation: No Ecchymosis: No Induration: No Erythema: No Rash: No Hemosiderin Staining: No Scarring: No Mottled: No Pallor: No Moisture Rubor: No No Abnormalities Noted: No Dry / Scaly: No Temperature / Pain Maceration: No Temperature: No Abnormality Tenderness on Palpation: Yes Wound Preparation Ulcer Cleansing: Rinsed/Irrigated with Saline Topical Anesthetic Applied: Other: lidocaine 4%, Treatment Notes Wound #2 (Left Calcaneus) 1. Cleansed with: Clean wound with Normal Saline 2. Anesthetic Topical Lidocaine 4% cream to wound bed prior to debridement 4. Dressing Applied: Hydrafera Blue 5. Secondary Dressing Applied Guaze, ABD and kerlix/Conform 7. Secured with Tape Other (specify in notes) Notes netting Electronic Signature(s) Signed: 12/08/2016 4:53:16 PM By: Curtis Sites Entered By: Curtis Sites on  12/08/2016 11:29:25 Ohnemus, Nisha ([id number]) -------------------------------------------------------------------------------- Vitals Details Patient Name: [name] Date of Service: 12/08/2016 11:00 AM Medical Record Number: [id number] Patient Account Number: [id number] Date of Birth/Sex: Sep 10, 1935 (80 y.o. Female) Treating RN: Curtis Sites Primary Care Jenny Omdahl: Duncan Dull Other Clinician: Referring Jezebelle Ledwell: Duncan Dull Treating Brayli Klingbeil/Extender: Linwood Dibbles, HOYT Weeks in Treatment: 16 Vital Signs Time Taken: 11:18 Temperature (F): 98.0 Height (in): 66 Pulse (bpm): 68 Weight (lbs): 140.8 Respiratory Rate (breaths/min): 16 Body Mass Index (BMI): 22.7 Blood Pressure (mmHg): 109/46 Reference Range: 80 - 120 mg / dl Electronic Signature(s) Signed: 12/08/2016 4:53:16 PM By: Curtis Sites Entered By: Curtis Sites on 12/08/2016 11:20:28

## 2016-12-22 ENCOUNTER — Encounter: Payer: Medicare (Managed Care) | Admitting: Internal Medicine

## 2016-12-22 DIAGNOSIS — L89623 Pressure ulcer of left heel, stage 3: Secondary | ICD-10-CM | POA: Diagnosis not present

## 2016-12-24 NOTE — Progress Notes (Signed)
[name], [name] ([id number]) Visit Report for 12/22/2016 Arrival Information Details Patient Name: [name] LoboCHEELY, [name] Date of Service: 12/22/2016 12:30 PM Medical Record Patient Account Number: [id number] Number: Treating RN: Curtis SitesDorthy, Joanna 08-26-1935 (81 y.o. Other Clinician: Date of Birth/Sex: Female) Treating ROBSON, [name] Primary Care Aysen Shieh: Duncan Dullullo, Teresa Safi Culotta/Extender: G Referring Keneisha Heckart: Denton Brickullo, Teresa Weeks in Treatment: 18 Visit Information History Since Last Visit Added or deleted any medications: No Patient Arrived: Walker Any new allergies or adverse reactions: No Arrival Time: 12:39 Had a fall or experienced change in No Accompanied By: dtr activities of daily living that may affect Transfer Assistance: None risk of falls: Patient Identification Verified: Yes Signs or symptoms of abuse/neglect since last No Secondary Verification Process Completed: Yes visito Patient Requires Transmission-Based No Hospitalized since last visit: No Precautions: Has Dressing in Place as Prescribed: Yes Patient Has Alerts: Yes Pain Present Now: No Patient Alerts: DM II Electronic Signature(s) Signed: 12/22/2016 5:39:07 PM By: Curtis Sitesorthy, Joanna Entered By: Curtis Sitesorthy, Joanna on 12/22/2016 12:40:03 [name], [name] ([id number]) -------------------------------------------------------------------------------- Clinic Level of Care Assessment Details Patient Name: [name], [name] Date of Service: 12/22/2016 12:30 PM Medical Record Patient Account Number: [id number] Number: Treating RN: Curtis SitesDorthy, Joanna 08-26-1935 (81 y.o. Other Clinician: Date of Birth/Sex: Female) Treating ROBSON, [name] Primary Care Kemari Narez: Duncan Dullullo, Teresa [place]/Extender: G Referring Raeanne Deschler: Denton Brickullo, Teresa Weeks in Treatment: 18 Clinic Level of Care Assessment Items TOOL 4 Quantity Score []  - Use when only an EandM is performed on FOLLOW-UP visit 0 ASSESSMENTS - Nursing Assessment /  Reassessment X - Reassessment of Co-morbidities (includes updates in patient status) 1 10 X - Reassessment of Adherence to Treatment Plan 1 5 ASSESSMENTS - Wound and Skin Assessment / Reassessment []  - Simple Wound Assessment / Reassessment - one wound 0 X - Complex Wound Assessment / Reassessment - multiple wounds 2 5 []  - Dermatologic / Skin Assessment (not related to wound area) 0 ASSESSMENTS - Focused Assessment []  - Circumferential Edema Measurements - multi extremities 0 []  - Nutritional Assessment / Counseling / Intervention 0 X - Lower Extremity Assessment (monofilament, tuning fork, pulses) 1 5 []  - Peripheral Arterial Disease Assessment (using hand held doppler) 0 ASSESSMENTS - Ostomy and/or Continence Assessment and Care []  - Incontinence Assessment and Management 0 []  - Ostomy Care Assessment and Management (repouching, etc.) 0 PROCESS - Coordination of Care X - Simple Patient / Family Education for ongoing care 1 15 []  - Complex (extensive) Patient / Family Education for ongoing care 0 []  - Staff obtains ChiropractorConsents, Records, Test Results / Process Orders 0 []  - Staff telephones HHA, Nursing Homes / Clarify orders / etc 0 [name], [name] ([id number]) []  - Routine Transfer to another Facility (non-emergent condition) 0 []  - Routine Hospital Admission (non-emergent condition) 0 []  - New Admissions / Manufacturing engineernsurance Authorizations / Ordering NPWT, Apligraf, etc. 0 []  - Emergency Hospital Admission (emergent condition) 0 X - Simple Discharge Coordination 1 10 []  - Complex (extensive) Discharge Coordination 0 PROCESS - Special Needs []  - Pediatric / Minor Patient Management 0 []  - Isolation Patient Management 0 []  - Hearing / Language / Visual special needs 0 []  - Assessment of Community assistance (transportation, D/C planning, etc.) 0 []  - Additional assistance / Altered mentation 0 []  - Support Surface(s) Assessment (bed, cushion, seat, etc.) 0 INTERVENTIONS - Wound Cleansing /  Measurement []  - Simple Wound Cleansing - one wound 0 X - Complex Wound Cleansing - multiple wounds 2 5 X - Wound Imaging (photographs - any number of wounds) 1 5 []  -  Wound Tracing (instead of photographs) 0 []  - Simple Wound Measurement - one wound 0 X - Complex Wound Measurement - multiple wounds 2 5 INTERVENTIONS - Wound Dressings X - Small Wound Dressing one or multiple wounds 2 10 []  - Medium Wound Dressing one or multiple wounds 0 []  - Large Wound Dressing one or multiple wounds 0 []  - Application of Medications - topical 0 []  - Application of Medications - injection 0 [name], [name] ([id number]) INTERVENTIONS - Miscellaneous []  - External ear exam 0 []  - Specimen Collection (cultures, biopsies, blood, body fluids, etc.) 0 []  - Specimen(s) / Culture(s) sent or taken to Lab for analysis 0 []  - Patient Transfer (multiple staff / Michiel Sites Lift / Similar devices) 0 []  - Simple Staple / Suture removal (25 or less) 0 []  - Complex Staple / Suture removal (26 or more) 0 []  - Hypo / Hyperglycemic Management (close monitor of Blood Glucose) 0 []  - Ankle / Brachial Index (ABI) - do not check if billed separately 0 X - Vital Signs 1 5 Has the patient been seen at the hospital within the last three years: Yes Total Score: 105 Level Of Care: New/Established - Level 3 Electronic Signature(s) Signed: 12/22/2016 5:39:07 PM By: Curtis Sites Entered By: Curtis Sites on 12/22/2016 16:58:52 [name], [name] -------------------------------------------------------------------------------- Encounter Discharge Information Details Patient Name: [name] Date of Service: 12/22/2016 12:30 PM Medical Record Patient Account Number: [id number] Number: Treating RN: Curtis Sites 1936-03-01 (81 y.o. Other Clinician: Date of Birth/Sex: Female) Treating ROBSON, [name] Primary Care Angelly Spearing: Duncan Dull Cheridan Kibler/Extender: G Referring Trajan Grove: Denton Brick in  Treatment: 18 Encounter Discharge Information Items Discharge Pain Level: 0 Discharge Condition: Stable Ambulatory Status: Walker Discharge Destination: Nursing Home Transportation: Private Auto Accompanied By: self Schedule Follow-up Appointment: Yes Medication Reconciliation completed No and provided to Patient/Care Aaryn Sermon: Provided on Clinical Summary of Care: 12/22/2016 Form Type Recipient Paper Patient [facility] Electronic Signature(s) Signed: 12/22/2016 4:59:55 PM By: Curtis Sites Entered By: Curtis Sites on 12/22/2016 16:59:54 Seitzinger, Amirra ([id number]) -------------------------------------------------------------------------------- Lower Extremity Assessment Details Patient Name: [name] Date of Service: 12/22/2016 12:30 PM Medical Record Patient Account Number: [id number] Number: Treating RN: Curtis Sites [birth year] (80 y.o. Other Clinician: Date of Birth/Sex: Female) Treating ROBSON, [name] Primary Care Tyannah Sane: Duncan Dull Hatem Cull/Extender: G Referring Thailyn Khalid: Denton Brick in Treatment: 18 Vascular Assessment Pulses: Dorsalis Pedis Palpable: [Left:Yes] [Right:Yes] Posterior Tibial Extremity colors, hair growth, and conditions: Extremity Color: [Left:Hyperpigmented] [Right:Hyperpigmented] Hair Growth on Extremity: [Left:No] [Right:No] Temperature of Extremity: [Left:Warm] [Right:Warm] Capillary Refill: [Left:< 3 seconds] [Right:< 3 seconds] Electronic Signature(s) Signed: 12/22/2016 5:39:07 PM By: Curtis Sites Entered By: Curtis Sites on 12/22/2016 12:52:03 Cope, Kearstyn ([id number]) -------------------------------------------------------------------------------- Multi Wound Chart Details Patient Name: [name] Date of Service: 12/22/2016 12:30 PM Medical Record Patient Account Number: [id number] Number: Treating RN: Curtis Sites 26-Oct-1935 (80 y.o. Other Clinician: Date of Birth/Sex: Female) Treating  ROBSON, [name] Primary Care Airelle Everding: Duncan Dull Yukiko Minnich/Extender: G Referring Raseel Jans: Denton Brick in Treatment: 18 Vital Signs Height(in): 66 Pulse(bpm): 64 Weight(lbs): 140.8 Blood Pressure 138/55 (mmHg): Body Mass Index(BMI): 23 Temperature(F): 97.7 Respiratory Rate 18 (breaths/min): Photos: [1:No Photos] [2:No Photos] [N/A:N/A] Wound Location: [1:Right Calcaneus] [2:Left Calcaneus] [N/A:N/A] Wounding Event: [1:Pressure Injury] [2:Pressure Injury] [N/A:N/A] Primary Etiology: [1:Pressure Ulcer] [2:Pressure Ulcer] [N/A:N/A] Comorbid History: [1:Cataracts, Anemia, Congestive Heart Failure, Congestive Heart Failure, Hypertension, Type II Diabetes, Gout, Dementia, Diabetes, Gout, Dementia, Neuropathy] [2:Cataracts, Anemia, Hypertension, Type II Neuropathy] [N/A:N/A] Date Acquired: [1:07/05/2016] [2:07/05/2016] [N/A:N/A] Weeks of Treatment: [1:18] [2:18] [  N/A:N/A] Wound Status: [1:Open] [2:Open] [N/A:N/A] Measurements L x W x D 0.4x1x0.1 [2:1x1.7x0.1] [N/A:N/A] (cm) Area (cm) : [1:0.314] [2:1.335] [N/A:N/A] Volume (cm) : [1:0.031] [2:0.134] [N/A:N/A] % Reduction in Area: [1:95.60%] [2:87.90%] [N/A:N/A] % Reduction in Volume: 97.80% [2:93.90%] [N/A:N/A] Classification: [1:Category/Stage III] [2:Category/Stage III] [N/A:N/A] Exudate Amount: [1:Large] [2:Large] [N/A:N/A] Exudate Type: [1:Serous] [2:Serous] [N/A:N/A] Exudate Color: [1:amber] [2:amber] [N/A:N/A] Foul Odor After [1:Yes] [2:Yes] [N/A:N/A] Cleansing: Odor Anticipated Due to No [2:No] [N/A:N/A] Product Use: Wound Margin: [1:Flat and Intact] [2:Flat and Intact] [N/A:N/A] Granulation Amount: [1:Large (67-100%)] [2:Large (67-100%)] [N/A:N/A] Granulation Quality: Pink Pink N/A Necrotic Amount: Small (1-33%) Small (1-33%) N/A Necrotic Tissue: Eschar, Adherent Slough Eschar, Adherent Slough N/A Exposed Structures: Fascia: No Fascia: No N/A Fat Layer (Subcutaneous Fat Layer (Subcutaneous Tissue)  Exposed: No Tissue) Exposed: No Tendon: No Tendon: No Muscle: No Muscle: No Joint: No Joint: No Bone: No Bone: No Limited to Skin Limited to Skin Breakdown Breakdown Epithelialization: None None N/A Periwound Skin Texture: Excoriation: No Excoriation: No N/A Induration: No Induration: No Callus: No Callus: No Crepitus: No Crepitus: No Rash: No Rash: No Scarring: No Scarring: No Periwound Skin Maceration: No Maceration: No N/A Moisture: Dry/Scaly: No Dry/Scaly: No Periwound Skin Color: Atrophie Blanche: No Atrophie Blanche: No N/A Cyanosis: No Cyanosis: No Ecchymosis: No Ecchymosis: No Erythema: No Erythema: No Hemosiderin Staining: No Hemosiderin Staining: No Mottled: No Mottled: No Pallor: No Pallor: No Rubor: No Rubor: No Temperature: No Abnormality No Abnormality N/A Tenderness on Yes Yes N/A Palpation: Wound Preparation: Ulcer Cleansing: Ulcer Cleansing: N/A Rinsed/Irrigated with Rinsed/Irrigated with Saline Saline Topical Anesthetic Topical Anesthetic Applied: Other: lidocaine Applied: Other: lidocaine 4% 4% Treatment Notes Electronic Signature(s) Signed: 12/22/2016 5:01:41 PM By: Baltazar Najjar MD Entered By: Baltazar Najjar on 12/22/2016 13:18:43 Rodwell, Dorthea ([id number]) -------------------------------------------------------------------------------- Multi-Disciplinary Care Plan Details Patient Name: [name] Date of Service: 12/22/2016 12:30 PM Medical Record Patient Account Number: [id number] Number: Treating RN: Curtis Sites 09-10-1935 (80 y.o. Other Clinician: Date of Birth/Sex: Female) Treating ROBSON, [name] Primary Care Daylin Gruszka: Duncan Dull Milik Gilreath/Extender: G Referring Mirel Hundal: Denton Brick in Treatment: 18 Active Inactive ` Abuse / Safety / Falls / Self Care Management Nursing Diagnoses: Impaired physical mobility Potential for falls Goals: Patient will remain injury free Date Initiated:  08/18/2016 Target Resolution Date: 10/29/2016 Goal Status: Active Interventions: Assess fall risk on admission and as needed Notes: ` Nutrition Nursing Diagnoses: Potential for alteratiion in Nutrition/Potential for imbalanced nutrition Goals: Patient/caregiver agrees to and verbalizes understanding of need to use nutritional supplements and/or vitamins as prescribed Date Initiated: 08/18/2016 Target Resolution Date: 10/29/2016 Goal Status: Active Interventions: Assess patient nutrition upon admission and as needed per policy Notes: ` Orientation to the Wound Care Program [place], Marty ([id number]) Nursing Diagnoses: Knowledge deficit related to the wound healing center program Goals: Patient/caregiver will verbalize understanding of the Wound Healing Center Program Date Initiated: 08/18/2016 Target Resolution Date: 10/29/2016 Goal Status: Active Interventions: Provide education on orientation to the wound center Notes: ` Wound/Skin Impairment Nursing Diagnoses: Impaired tissue integrity Goals: Patient/caregiver will verbalize understanding of skin care regimen Date Initiated: 08/18/2016 Target Resolution Date: 10/29/2016 Goal Status: Active Ulcer/skin breakdown will have a volume reduction of 30% by week 4 Date Initiated: 08/18/2016 Target Resolution Date: 10/29/2016 Goal Status: Active Ulcer/skin breakdown will have a volume reduction of 50% by week 8 Date Initiated: 08/18/2016 Target Resolution Date: 10/29/2016 Goal Status: Active Ulcer/skin breakdown will have a volume reduction of 80% by week 12 Date Initiated: 08/18/2016 Target Resolution Date: 10/29/2016 Goal Status: Active  Ulcer/skin breakdown will heal within 14 weeks Date Initiated: 08/18/2016 Target Resolution Date: 10/29/2016 Goal Status: Active Interventions: Assess patient/caregiver ability to obtain necessary supplies Assess patient/caregiver ability to perform ulcer/skin care regimen upon admission and as  needed Assess ulceration(s) every visit Notes: Electronic Signature(s) Signed: 12/22/2016 5:39:07 PM By: Charlotta Newton, Traniya ([id number]) Entered By: Curtis Sites on 12/22/2016 12:52:09 Tortorelli, Svetlana ([id number]) -------------------------------------------------------------------------------- Pain Assessment Details Patient Name: [name] Date of Service: 12/22/2016 12:30 PM Medical Record Patient Account Number: [id number] Number: Treating RN: Curtis Sites Sep 05, 1935 (80 y.o. Other Clinician: Date of Birth/Sex: Female) Treating ROBSON, [name] Primary Care Elior Robinette: Duncan Dull Sheliah Fiorillo/Extender: G Referring Espen Bethel: Denton Brick in Treatment: 18 Active Problems Location of Pain Severity and Description of Pain Patient Has Paino No Site Locations Pain Management and Medication Current Pain Management: Notes Topical or injectable lidocaine is offered to patient for acute pain when surgical debridement is performed. If needed, Patient is instructed to use over the counter pain medication for the following 24-48 hours after debridement. Wound care MDs do not prescribed pain medications. Patient has chronic pain or uncontrolled pain. Patient has been instructed to make an appointment with their Primary Care Physician for pain management. Electronic Signature(s) Signed: 12/22/2016 5:39:07 PM By: Curtis Sites Entered By: Curtis Sites on 12/22/2016 12:40:12 Ambrosini, Faithanne ([id number]) -------------------------------------------------------------------------------- Patient/Caregiver Education Details Patient Name: [name] Date of Service: 12/22/2016 12:30 PM Medical Record Patient Account Number: [id number] Number: Treating RN: Curtis Sites 08-09-1935 (80 y.o. Other Clinician: Date of Birth/Gender: Female) Treating ROBSON, [name] Primary Care Physician/Extender: Virgil Benedict Physician: Tania Ade in Treatment:  18 Referring Physician: Duncan Dull Education Assessment Education Provided To: Caregiver SNF staff via written orders Education Topics Provided Wound/Skin Impairment: Handouts: Other: wound care orders Methods: Printed Electronic Signature(s) Signed: 12/22/2016 5:39:07 PM By: Curtis Sites Entered By: Curtis Sites on 12/22/2016 17:00:20 Shasteen, Ivry ([id number]) -------------------------------------------------------------------------------- Wound Assessment Details Patient Name: [name] Date of Service: 12/22/2016 12:30 PM Medical Record Patient Account Number: [id number] Number: Treating RN: Curtis Sites 10-10-1935 (80 y.o. Other Clinician: Date of Birth/Sex: Female) Treating ROBSON, [name] Primary Care Talyn Eddie: Duncan Dull Kasumi Ditullio/Extender: G Referring Shandrika Ambers: Denton Brick in Treatment: 18 Wound Status Wound Number: 1 Primary Pressure Ulcer Etiology: Wound Location: Right Calcaneus Wound Open Wounding Event: Pressure Injury Status: Date Acquired: 07/05/2016 Comorbid Cataracts, Anemia, Congestive Heart Weeks Of Treatment: 18 History: Failure, Hypertension, Type II Diabetes, Clustered Wound: No Gout, Dementia, Neuropathy Photos Photo Uploaded By: Curtis Sites on 12/23/2016 13:10:50 Wound Measurements Length: (cm) 0.4 % Reduction in A Width: (cm) 1 % Reduction in V Depth: (cm) 0.1 Epithelializatio Area: (cm) 0.314 Tunneling: Volume: (cm) 0.031 Undermining: rea: 95.6% olume: 97.8% n: None No No Wound Description Classification: Category/Stage III Foul Odor After Wound Margin: Flat and Intact Due to Product Exudate Amount: Large Slough/Fibrino Exudate Type: Serous Exudate Color: amber Cleansing: Yes Use: No No Wound Bed Granulation Amount: Large (67-100%) Exposed Structure Granulation Quality: Pink Fascia Exposed: No Houser, Vauda ([id number]) Necrotic Amount: Small (1-33%) Fat Layer (Subcutaneous Tissue)  Exposed: No Necrotic Quality: Eschar, Adherent Slough Tendon Exposed: No Muscle Exposed: No Joint Exposed: No Bone Exposed: No Limited to Skin Breakdown Periwound Skin Texture Texture Color No Abnormalities Noted: No No Abnormalities Noted: No Callus: No Atrophie Blanche: No Crepitus: No Cyanosis: No Excoriation: No Ecchymosis: No Induration: No Erythema: No Rash: No Hemosiderin Staining: No Scarring: No Mottled: No Pallor: No Moisture Rubor: No No Abnormalities Noted: No Dry / Scaly: No Temperature /  Pain Maceration: No Temperature: No Abnormality Tenderness on Palpation: Yes Wound Preparation Ulcer Cleansing: Rinsed/Irrigated with Saline Topical Anesthetic Applied: Other: lidocaine 4%, Treatment Notes Wound #1 (Right Calcaneus) 1. Cleansed with: Clean wound with Normal Saline 2. Anesthetic Topical Lidocaine 4% cream to wound bed prior to debridement 4. Dressing Applied: Hydrafera Blue 5. Secondary Dressing Applied Guaze, ABD and kerlix/Conform 7. Secured with Tape Other (specify in notes) Notes netting Electronic Signature(s) Signed: 12/22/2016 5:39:07 PM By: Curtis Sites Entered By: Curtis Sites on 12/22/2016 12:51:29 [place], Dallie ([id number]) Spiceland, Jacqualyn ([id number]) -------------------------------------------------------------------------------- Wound Assessment Details Patient Name: [name] Date of Service: 12/22/2016 12:30 PM Medical Record Patient Account Number: [id number] Number: Treating RN: Curtis Sites 1935/11/15 (80 y.o. Other Clinician: Date of Birth/Sex: Female) Treating ROBSON, [name] Primary Care Tranquilino Fischler: Duncan Dull Saron Vanorman/Extender: G Referring Keiji Melland: Denton Brick in Treatment: 18 Wound Status Wound Number: 2 Primary Pressure Ulcer Etiology: Wound Location: Left Calcaneus Wound Open Wounding Event: Pressure Injury Status: Date Acquired: 07/05/2016 Comorbid Cataracts, Anemia,  Congestive Heart Weeks Of Treatment: 18 History: Failure, Hypertension, Type II Diabetes, Clustered Wound: No Gout, Dementia, Neuropathy Photos Photo Uploaded By: Curtis Sites on 12/23/2016 13:10:50 Wound Measurements Length: (cm) 1 % Reduction in A Width: (cm) 1.7 % Reduction in V Depth: (cm) 0.1 Epithelializatio Area: (cm) 1.335 Tunneling: Volume: (cm) 0.134 Undermining: rea: 87.9% olume: 93.9% n: None No No Wound Description Classification: Category/Stage III Foul Odor After Wound Margin: Flat and Intact Due to Product Exudate Amount: Large Slough/Fibrino Exudate Type: Serous Exudate Color: amber Cleansing: Yes Use: No No Wound Bed Granulation Amount: Large (67-100%) Exposed Structure Granulation Quality: Pink Fascia Exposed: No Hudnall, Syriah ([id number]) Necrotic Amount: Small (1-33%) Fat Layer (Subcutaneous Tissue) Exposed: No Necrotic Quality: Eschar, Adherent Slough Tendon Exposed: No Muscle Exposed: No Joint Exposed: No Bone Exposed: No Limited to Skin Breakdown Periwound Skin Texture Texture Color No Abnormalities Noted: No No Abnormalities Noted: No Callus: No Atrophie Blanche: No Crepitus: No Cyanosis: No Excoriation: No Ecchymosis: No Induration: No Erythema: No Rash: No Hemosiderin Staining: No Scarring: No Mottled: No Pallor: No Moisture Rubor: No No Abnormalities Noted: No Dry / Scaly: No Temperature / Pain Maceration: No Temperature: No Abnormality Tenderness on Palpation: Yes Wound Preparation Ulcer Cleansing: Rinsed/Irrigated with Saline Topical Anesthetic Applied: Other: lidocaine 4%, Treatment Notes Wound #2 (Left Calcaneus) 1. Cleansed with: Clean wound with Normal Saline 2. Anesthetic Topical Lidocaine 4% cream to wound bed prior to debridement 4. Dressing Applied: Hydrafera Blue 5. Secondary Dressing Applied Guaze, ABD and kerlix/Conform 7. Secured with Tape Other (specify in  notes) Notes netting Electronic Signature(s) Signed: 12/22/2016 5:39:07 PM By: Curtis Sites Entered By: Curtis Sites on 12/22/2016 12:51:40 [place], Galilee ([id number]) Davis, Lujain ([id number]) -------------------------------------------------------------------------------- Vitals Details Patient Name: [name] Date of Service: 12/22/2016 12:30 PM Medical Record Patient Account Number: [id number] Number: Treating RN: Curtis Sites Jan 12, 1936 (80 y.o. Other Clinician: Date of Birth/Sex: Female) Treating ROBSON, [name] Primary Care Reniya Mcclees: Duncan Dull Eliyanah Elgersma/Extender: G Referring Jayd Cadieux: Denton Brick in Treatment: 18 Vital Signs Time Taken: 12:43 Temperature (F): 97.7 Height (in): 66 Pulse (bpm): 64 Weight (lbs): 140.8 Respiratory Rate (breaths/min): 18 Body Mass Index (BMI): 22.7 Blood Pressure (mmHg): 138/55 Reference Range: 80 - 120 mg / dl Electronic Signature(s) Signed: 12/22/2016 5:39:07 PM By: Curtis Sites Entered By: Curtis Sites on 12/22/2016 12:43:08

## 2016-12-24 NOTE — Progress Notes (Signed)
[name], [name] ([id number]) Visit Report for 12/22/2016 HPI Details Patient Name: [name], [name] Date of Service: 12/22/2016 12:30 PM Medical Record Patient Account Number: [id number] Number: Treating RN: [name] 1936-03-07 (81 y.o. Other Clinician: Date of Birth/Sex: Female) Treating [name] Primary Care Provider: Duncan Dullullo, [name] Provider/Extender: G Referring Provider: Denton Brickullo, [name] [name] in Treatment: 18 History of Present Illness HPI Description: 08/18/16; this is a 81 year old woman who is a type II diabetic not currently on any treatment. She is also listed as having Alzheimer's disease hypertension. She has a history of chronic venous insufficiency with leg wounds in the past but no history of wounds in her feet. In terms of her diabetes at one point she was on insulin however she is no longer on any current treatment, her daughter who is providing most of the history is unaware of her hemoglobin A1c. She has stage IV chronic renal failure and follows with nephrology. The history provided by her daughter is that she has had a wound on the left heel perhaps dating back to last May/17. At that point she was in the hospital predominantly with psychiatric issues and was [facility]. This was treated with some form of foam dressing and may have actually healed however she was readmitted to hospital in January with Escherichia coli sepsis felt to be secondary to a UTI. Since then she is in [facility] skilled facility. Apparently this timeframe both the left heel and right heel reopened or at least the daughter became aware that they were both open. The exact timeframe isn't really certain Her ABIs in this clinic were 1.08 on the right 1.25 on the left. Looking through [facility] doesn't really provide any useful information. She did have venous ultrasounds in 2016 that did not show a DVT. I don't see a recent hemoglobin A1c 08/25/16; from last  week we have been using Santyl to both heels. Apparently the nursing home didn't get an x-ray of both heels [[facility]] and the daughter states that there was "no injury to bone" but for some reason we haven't been able to get the official report from them. 09/01/16; continued improvement in both wounds on her bilateral heels using Santyl. X-ray report was negative for osteomyelitis 09/08/16; patient from [facility]. Using Santyl on both her heel wounds which are fortunately not on the plantar surface. 09/15/16; patient is from [facility]. She has been using Santyl on both her pressure areas which were stage III. Fortunately these or not on the plantar surface. 09/22/16; patient arrives today with a much less viable looking surface on her heels. We had been using Santyl with good improvement however unchanged either fair last week. This may be unrelated however she required debridement today. 09/29/16; patient arrives with a better looking surface on the left heel unfortunately this is a more substantial wound. The area on the right lateral calcaneus again covered in a nonviable necrotic surface. We have been making good progress with Santyl. I think I'm going to need to go back to a debriding agent. 10/06/16; still nonviable surface material over the right greater than the left heel. Apparently the facility where [name] ([id number]) this woman resides did not have Iodoflex so rather than calling the clinic they decided to make some cocktail of material and then settled on Hydrofera Blue. Patient is really generally made pretty good progress versus when she came in her first however still has too much nonviable surface especially on the  right. 10/13/16; she arrives today with Iodoflex on both heels. This was the dressing that we couldn't seem to get last week. Her daughter tells [place] that where they were traveling to [place] last weekend she  actually use Santyl. Both wounds are in better condition today and didn't seem to require debridement however I'm not exactly sure what is been most helpful 10/20/16; using Santyl to both heels better condition. Nursing reports green drainage 11/03/16 patient wounds appeared to be doing fairly well on evaluation today she seen along with her daughter in the office today. She does have slough covering the wound unfortunately no evidence of infection. 11/10/16; both wounds appear to be making nice progress. This is on the bilateral heels 11/24/16; he also continued to be making nice progress. Wounds are smaller. Using Hydrofera Blue. Orders are to change daily in the nursing home although according to her daughter they're actually being changed to her 3 times a week which is probably satisfactory 12/08/16 on evaluation today patient bilateral hills appear to be making improvement. Improvement is somewhat slow but nonetheless week by week we are noting some change. Overall I'm pleased with the progress that we have seen. Nonetheless patient states that she really would like for this to already be completely healed and closed which obviously it is not. She wonders how long it's gonna be before she can get back to normal walking and wearing shoes. No fevers, chills, nausea, or vomiting noted at this time. Secondary to mental status patient is unable to rate or describe her pain. 12/22/16; patient continues to make nice progress. The area on the right lateral calcaneus as improved and dimensions quite a bit since the last time I saw this, the area on the left is more substantial but also seems to be making improvement. No debridement was required in any area Electronic Signature(s) Signed: 12/22/2016 5:01:41 PM By: [name] Najjar MD Entered By: [name] on 12/22/2016 13:19:35 [name] -------------------------------------------------------------------------------- Physical Exam  Details Patient Name: [name] Date of Service: 12/22/2016 12:30 PM Medical Record Patient Account Number: [id number] Number: Treating RN: [name] Sites March 08, 1936 (80 y.o. Other Clinician: Date of Birth/Sex: Female) Treating [name] Primary Care Provider: Duncan Dull Provider/Extender: G Referring Provider: Denton Brick in Treatment: 18 Constitutional Sitting or standing Blood Pressure is within target range for patient.. Pulse regular and within target range for patient.Marland Kitchen Respirations regular, non-labored and within target range.. Temperature is normal and within the target range for the patient.Marland Kitchen appears in no distress. Notes When exam; patient's wounds appear to be significantly improved and wound area since the last time I saw this. Base of both wounds look satisfactory. No debridement is required. The area on the right has raised senescent looking edges which may require debridement next time she is here. We've been using Hydrofera Blue and appear to be making nice progress Electronic Signature(s) Signed: 12/22/2016 5:01:41 PM By: [name] Najjar MD Entered By: [name] on 12/22/2016 13:20:31 [place], [name] ([id number]) -------------------------------------------------------------------------------- Physician Orders Details Patient Name: [name] Date of Service: 12/22/2016 12:30 PM Medical Record Patient Account Number: [id number] Number: Treating RN: [name] Sites 1935/05/31 (80 y.o. Other Clinician: Date of Birth/Sex: Female) Treating [name] Primary Care Provider: Duncan Dull Provider/Extender: G Referring Provider: Denton Brick in Treatment: 25 Verbal / Phone Orders: No Diagnosis Coding Wound Cleansing Wound #1 Right Calcaneus o Clean wound with Normal Saline. o May Shower, gently pat wound dry prior to applying new dressing. Wound #2 Left  Calcaneus o Clean wound with Normal  Saline. o May Shower, gently pat wound dry prior to applying new dressing. Anesthetic Wound #1 Right Calcaneus o Topical Lidocaine 4% cream applied to wound bed prior to debridement Wound #2 Left Calcaneus o Topical Lidocaine 4% cream applied to wound bed prior to debridement Primary Wound Dressing Wound #1 Right Calcaneus o Hydrafera Blue Wound #2 Left Calcaneus o Hydrafera Blue Secondary Dressing Wound #1 Right Calcaneus o Dry Gauze o Conform/Kerlix o Foam - heel cups o Other - stretch netting #4 (please order for pt...daughter is aware) Wound #2 Left Calcaneus o Dry Gauze o Conform/Kerlix o Foam - heel cups [place], [name] ([id number]) o Other - stretch netting #4 (please order for pt...daughter is aware) Dressing Change Frequency Wound #1 Right Calcaneus o Change dressing every day. Wound #2 Left Calcaneus o Change dressing every day. Follow-up Appointments Wound #1 Right Calcaneus o Return Appointment in 2 [name]. Wound #2 Left Calcaneus o Return Appointment in 2 [name]. Edema Control Wound #1 Right Calcaneus o Elevate legs to the level of the heart and pump ankles as often as possible Wound #2 Left Calcaneus o Elevate legs to the level of the heart and pump ankles as often as possible Off-Loading Wound #1 Right Calcaneus o Turn and reposition every 2 hours o Other: - wear heel protector boots at al times in bed, float heels while in bed, Wound #2 Left Calcaneus o Turn and reposition every 2 hours o Other: - wear heel protector boots at al times in bed, float heels while in bed, Additional Orders / Instructions Wound #1 Right Calcaneus o Increase protein intake. o Other: - please add vitamin A, vitamin C and zinc supplements to your diet Wound #2 Left Calcaneus o Increase protein intake. o Other: - please add vitamin A, vitamin C and zinc supplements to your diet Electronic Signature(s) Signed: 12/22/2016  5:01:41 PM By: [name] Najjar MD Signed: 12/22/2016 5:39:07 PM By: [name] Sites Entered By: [name] Sites on 12/22/2016 13:08:35 [place], [name] ([id number]) [name], [name] ([id number]) -------------------------------------------------------------------------------- Problem List Details Patient Name: [name] Date of Service: 12/22/2016 12:30 PM Medical Record Patient Account Number: [id number] Number: Treating RN: [name] Sites Jun 28, 1935 (80 y.o. Other Clinician: Date of Birth/Sex: Female) Treating [name] Primary Care Provider: Duncan Dull Provider/Extender: G Referring Provider: Denton Brick in Treatment: 18 Active Problems ICD-10 Encounter Code Description Active Date Diagnosis E11.621 Type 2 diabetes mellitus with foot ulcer 08/18/2016 Yes L89.623 Pressure ulcer of left heel, stage 3 08/18/2016 Yes L89.613 Pressure ulcer of right heel, stage 3 08/18/2016 Yes Inactive Problems Resolved Problems Electronic Signature(s) Signed: 12/22/2016 5:01:41 PM By: [name] Najjar MD Entered By: [name] on 12/22/2016 13:18:32 [name], [name] ([id number]) -------------------------------------------------------------------------------- Progress Note Details Patient Name: [name] Date of Service: 12/22/2016 12:30 PM Medical Record Patient Account Number: [id number] Number: Treating RN: [name] Sites [birth year] (80 y.o. Other Clinician: Date of Birth/Sex: Female) Treating [name] Primary Care Provider: Duncan Dull Provider/Extender: G Referring Provider: Denton Brick in Treatment: 18 Subjective History of Present Illness (HPI) 08/18/16; this is a 81 year old woman who is a type II diabetic not currently on any treatment. She is also listed as having Alzheimer's disease hypertension. She has a history of chronic venous insufficiency with leg wounds in the past but no history of wounds in her feet. In terms of her  diabetes at one point she was on insulin however she is no longer on any current treatment, her daughter who is providing  most of the history is unaware of her hemoglobin A1c. She has stage IV chronic renal failure and follows with nephrology. The history provided by her daughter is that she has had a wound on the left heel perhaps dating back to last May/17. At that point she was in the hospital predominantly with psychiatric issues and was discharged [facility]. This was treated with some form of foam dressing and may have actually healed however she was readmitted to hospital in January with Escherichia coli sepsis felt to be secondary to a UTI. Since then she is in [facility]. Apparently this timeframe both the left heel and right heel reopened or at least the daughter became aware that they were both open. The exact timeframe isn't really certain Her ABIs in this clinic were 1.08 on the right 1.25 on the left. Looking through [facility] doesn't really provide any useful information. She did have venous ultrasounds in 2016 that did not show a DVT. I don't see a recent hemoglobin A1c 08/25/16; from last week we have been using Santyl to both heels. Apparently the nursing home didn't get an x-ray of both heels [[facility]] and the daughter states that there was "no injury to bone" but for some reason we haven't been able to get the official report from them. 09/01/16; continued improvement in both wounds on her bilateral heels using Santyl. X-ray report was negative for osteomyelitis 09/08/16; patient from [facility] skilled facility. Using Santyl on both her heel wounds which are fortunately not on the plantar surface. 09/15/16; patient is from [facility] skilled facility. She has been using Santyl on both her pressure areas which were stage III. Fortunately these or not on the plantar surface. 09/22/16; patient arrives today with a much less viable  looking surface on her heels. We had been using Santyl with good improvement however unchanged either fair last week. This may be unrelated however she required debridement today. 09/29/16; patient arrives with a better looking surface on the left heel unfortunately this is a more substantial wound. The area on the right lateral calcaneus again covered in a nonviable necrotic surface. We have been making good progress with Santyl. I think I'm going to need to go back to a debriding agent. 10/06/16; still nonviable surface material over the right greater than the left heel. Apparently the facility where this woman resides did not have Iodoflex so rather than calling the clinic they decided to make some cocktail of material and then settled on Hydrofera Blue. Patient is really generally made pretty good progress versus when she came in her first however still has too much nonviable surface especially on the [facility], [name] right. 10/13/16; she arrives today with Iodoflex on both heels. This was the dressing that we couldn't seem to get last week. Her daughter tells [place] that where they were traveling to [place] last weekend she actually use Santyl. Both wounds are in better condition today and didn't seem to require debridement however I'm not exactly sure what is been most helpful 10/20/16; using Santyl to both heels better condition. Nursing reports green drainage 11/03/16 patient wounds appeared to be doing fairly well on evaluation today she seen along with her daughter in the office today. She does have slough covering the wound unfortunately no evidence of infection. 11/10/16; both wounds appear to be making nice progress. This is on the bilateral heels 11/24/16; he also continued to be making nice progress. Wounds are smaller. Using Hydrofera Blue.  Orders are to change daily in the nursing home although according to her daughter they're actually being changed to her 3 times a week which  is probably satisfactory 12/08/16 on evaluation today patient bilateral hills appear to be making improvement. Improvement is somewhat slow but nonetheless week by week we are noting some change. Overall I'm pleased with the progress that we have seen. Nonetheless patient states that she really would like for this to already be completely healed and closed which obviously it is not. She wonders how long it's gonna be before she can get back to normal walking and wearing shoes. No fevers, chills, nausea, or vomiting noted at this time. Secondary to mental status patient is unable to rate or describe her pain. 12/22/16; patient continues to make nice progress. The area on the right lateral calcaneus as improved and dimensions quite a bit since the last time I saw this, the area on the left is more substantial but also seems to be making improvement. No debridement was required in any area Objective Constitutional Sitting or standing Blood Pressure is within target range for patient.. Pulse regular and within target range for patient.Marland Kitchen Respirations regular, non-labored and within target range.. Temperature is normal and within the target range for the patient.Marland Kitchen appears in no distress. Vitals Time Taken: 12:43 PM, Height: 66 in, Weight: 140.8 lbs, BMI: 22.7, Temperature: 97.7 F, Pulse: 64 bpm, Respiratory Rate: 18 breaths/min, Blood Pressure: 138/55 mmHg. General Notes: When exam; patient's wounds appear to be significantly improved and wound area since the last time I saw this. Base of both wounds look satisfactory. No debridement is required. The area on the right has raised senescent looking edges which may require debridement next time she is here. We've been using Hydrofera Blue and appear to be making nice progress Integumentary (Hair, Skin) Wound #1 status is Open. Original cause of wound was Pressure Injury. The wound is located on the Right [place], [name] Calcaneus. The wound  measures 0.4cm length x 1cm width x 0.1cm depth; 0.314cm^2 area and 0.031cm^3 volume. The wound is limited to skin breakdown. There is no tunneling or undermining noted. There is a large amount of serous drainage noted. Foul odor after cleansing was noted. The wound margin is flat and intact. There is large (67-100%) pink granulation within the wound bed. There is a small (1-33%) amount of necrotic tissue within the wound bed including Eschar and Adherent Slough. The periwound skin appearance did not exhibit: Callus, Crepitus, Excoriation, Induration, Rash, Scarring, Dry/Scaly, Maceration, Atrophie Blanche, Cyanosis, Ecchymosis, Hemosiderin Staining, Mottled, Pallor, Rubor, Erythema. Periwound temperature was noted as No Abnormality. The periwound has tenderness on palpation. Wound #2 status is Open. Original cause of wound was Pressure Injury. The wound is located on the Left Calcaneus. The wound measures 1cm length x 1.7cm width x 0.1cm depth; 1.335cm^2 area and 0.134cm^3 volume. The wound is limited to skin breakdown. There is no tunneling or undermining noted. There is a large amount of serous drainage noted. Foul odor after cleansing was noted. The wound margin is flat and intact. There is large (67-100%) pink granulation within the wound bed. There is a small (1-33%) amount of necrotic tissue within the wound bed including Eschar and Adherent Slough. The periwound skin appearance did not exhibit: Callus, Crepitus, Excoriation, Induration, Rash, Scarring, Dry/Scaly, Maceration, Atrophie Blanche, Cyanosis, Ecchymosis, Hemosiderin Staining, Mottled, Pallor, Rubor, Erythema. Periwound temperature was noted as No Abnormality. The periwound has tenderness on palpation. Assessment Active Problems ICD-10 E11.621 - Type  2 diabetes mellitus with foot ulcer L89.623 - Pressure ulcer of left heel, stage 3 L89.613 - Pressure ulcer of right heel, stage 3 Plan Wound Cleansing: Wound #1 Right  Calcaneus: Clean wound with Normal Saline. May Shower, gently pat wound dry prior to applying new dressing. Wound #2 Left Calcaneus: Clean wound with Normal Saline. May Shower, gently pat wound dry prior to applying new dressing. Anesthetic: Wound #1 Right Calcaneus: Topical Lidocaine 4% cream applied to wound bed prior to debridement Wound #2 Left Calcaneus: Topical Lidocaine 4% cream applied to wound bed prior to debridement [place], [name] ([id number]) Primary Wound Dressing: Wound #1 Right Calcaneus: Hydrafera Blue Wound #2 Left Calcaneus: Hydrafera Blue Secondary Dressing: Wound #1 Right Calcaneus: Dry Gauze Conform/Kerlix Foam - heel cups Other - stretch netting #4 (please order for pt...daughter is aware) Wound #2 Left Calcaneus: Dry Gauze Conform/Kerlix Foam - heel cups Other - stretch netting #4 (please order for pt...daughter is aware) Dressing Change Frequency: Wound #1 Right Calcaneus: Change dressing every day. Wound #2 Left Calcaneus: Change dressing every day. Follow-up Appointments: Wound #1 Right Calcaneus: Return Appointment in 2 [name]. Wound #2 Left Calcaneus: Return Appointment in 2 [name]. Edema Control: Wound #1 Right Calcaneus: Elevate legs to the level of the heart and pump ankles as often as possible Wound #2 Left Calcaneus: Elevate legs to the level of the heart and pump ankles as often as possible Off-Loading: Wound #1 Right Calcaneus: Turn and reposition every 2 hours Other: - wear heel protector boots at al times in bed, float heels while in bed, Wound #2 Left Calcaneus: Turn and reposition every 2 hours Other: - wear heel protector boots at al times in bed, float heels while in bed, Additional Orders / Instructions: Wound #1 Right Calcaneus: Increase protein intake. Other: - please add vitamin A, vitamin C and zinc supplements to your diet Wound #2 Left Calcaneus: Increase protein intake. Other: - please add vitamin A, vitamin C and  zinc supplements to your diet [name], [name] ([id number]) #1 continue Hydrofera Blue/foam heel cups/kerlix #2 possible debridement of the right heel in 2 [name]' time Electronic Signature(s) Signed: 12/22/2016 5:01:41 PM By: [name] Najjar MD Entered By: [name] on 12/22/2016 13:21:18 [name], [name] ([id number]) -------------------------------------------------------------------------------- SuperBill Details Patient Name: [name] Date of Service: 12/22/2016 Medical Record Patient Account Number: [id number] Number: Treating RN: [name] Sites 1936/03/15 (80 y.o. Other Clinician: Date of Birth/Sex: Female) Treating [name] Primary Care Provider: Duncan Dull Provider/Extender: G Referring Provider: Denton Brick in Treatment: 18 Diagnosis Coding ICD-10 Codes Code Description E11.621 Type 2 diabetes mellitus with foot ulcer L89.623 Pressure ulcer of left heel, stage 3 L89.613 Pressure ulcer of right heel, stage 3 Facility Procedures CPT4 Code: [id number] Description: 99213 - WOUND CARE VISIT-LEV 3 EST PT Modifier: Quantity: 1 Physician Procedures CPT4 Code: [id number] Description: 44010 - WC PHYS LEVEL 2 - EST PT ICD-10 Description Diagnosis L89.623 Pressure ulcer of left heel, stage 3 L89.613 Pressure ulcer of right heel, stage 3 Modifier: Quantity: 1 Electronic Signature(s) Signed: 12/22/2016 4:59:00 PM By: [name] Sites Signed: 12/22/2016 5:01:41 PM By: [name] Najjar MD Entered By: [name] Sites on 12/22/2016 16:59:00

## 2017-01-05 ENCOUNTER — Encounter: Payer: Medicare (Managed Care) | Attending: Internal Medicine | Admitting: Internal Medicine

## 2017-01-05 DIAGNOSIS — E11621 Type 2 diabetes mellitus with foot ulcer: Secondary | ICD-10-CM | POA: Insufficient documentation

## 2017-01-05 DIAGNOSIS — G309 Alzheimer's disease, unspecified: Secondary | ICD-10-CM | POA: Insufficient documentation

## 2017-01-05 DIAGNOSIS — E1122 Type 2 diabetes mellitus with diabetic chronic kidney disease: Secondary | ICD-10-CM | POA: Diagnosis not present

## 2017-01-05 DIAGNOSIS — L89613 Pressure ulcer of right heel, stage 3: Secondary | ICD-10-CM | POA: Diagnosis not present

## 2017-01-05 DIAGNOSIS — F0280 Dementia in other diseases classified elsewhere without behavioral disturbance: Secondary | ICD-10-CM | POA: Diagnosis not present

## 2017-01-05 DIAGNOSIS — L89623 Pressure ulcer of left heel, stage 3: Secondary | ICD-10-CM | POA: Diagnosis not present

## 2017-01-05 DIAGNOSIS — E1140 Type 2 diabetes mellitus with diabetic neuropathy, unspecified: Secondary | ICD-10-CM | POA: Diagnosis not present

## 2017-01-05 DIAGNOSIS — N184 Chronic kidney disease, stage 4 (severe): Secondary | ICD-10-CM | POA: Diagnosis not present

## 2017-01-05 DIAGNOSIS — I129 Hypertensive chronic kidney disease with stage 1 through stage 4 chronic kidney disease, or unspecified chronic kidney disease: Secondary | ICD-10-CM | POA: Diagnosis not present

## 2017-01-06 NOTE — Progress Notes (Signed)
[name], Sterling ([id number]) Visit Report for 01/05/2017 Debridement Details Patient Name: [name], Sue Date of Service: 01/05/2017 1:30 PM Medical Record Patient Account Number: [id number] Number: Treating RN: Curtis SitesDorthy, Joanna [birth year] (80 y.o. Other Clinician: Date of Birth/Sex: Female) Treating ROBSON, [name] Primary Care Provider: Duncan Dullullo, Teresa Provider/Extender: G Referring Provider: Denton Brickullo, Teresa Weeks in Treatment: 20 Debridement Performed for Wound #1 Right Calcaneus Assessment: Performed By: Physician Maxwell CaulOBSON, [name] G, MD Debridement: Debridement Pre-procedure Verification/Time Out Yes - 14:02 Taken: Start Time: 14:02 Pain Control: Lidocaine 4% Topical Solution Level: Skin/Subcutaneous Tissue Total Area Debrided (L x 0.1 (cm) x 0.7 (cm) = 0.07 (cm) W): Tissue and other Viable, Non-Viable, Callus, Fibrin/Slough, Subcutaneous material debrided: Instrument: Curette Bleeding: Minimum Hemostasis Achieved: Pressure End Time: 14:05 Procedural Pain: 0 Post Procedural Pain: 0 Response to Treatment: Procedure was tolerated well Post Debridement Measurements of Total Wound Length: (cm) 0.3 Stage: Category/Stage III Width: (cm) 0.7 Depth: (cm) 0.1 Volume: (cm) 0.016 Character of Wound/Ulcer Post Improved Debridement: Post Procedure Diagnosis Same as Pre-procedure Electronic Signature(s) Ken CarylHEELY, Ralene ([id number]) Signed: 01/05/2017 4:34:43 PM By: Baltazar Najjarobson, [name] MD Signed: 01/05/2017 4:37:06 PM By: Curtis Sitesorthy, Joanna Entered By: Baltazar Najjarobson, [name] on 01/05/2017 14:47:27 Prien, Neyra ([id number]) -------------------------------------------------------------------------------- Debridement Details Patient Name: [name] LoboHEELY, [name] Date of Service: 01/05/2017 1:30 PM Medical Record Patient Account Number: [id number] Number: Treating RN: Curtis Sitesorthy, Joanna [birth year] (80 y.o. Other Clinician: Date of Birth/Sex: Female) Treating ROBSON, [name] Primary  Care Provider: Duncan Dullullo, Teresa Provider/Extender: G Referring Provider: Denton Brickullo, Teresa Weeks in Treatment: 20 Debridement Performed for Wound #2 Left Calcaneus Assessment: Performed By: Physician Maxwell CaulOBSON, [name] G, MD Debridement: Debridement Pre-procedure Verification/Time Out Yes - 14:05 Taken: Start Time: 14:05 Pain Control: Lidocaine 4% Topical Solution Level: Skin/Subcutaneous Tissue Total Area Debrided (L x 0.9 (cm) x 1.5 (cm) = 1.35 (cm) W): Tissue and other Viable, Non-Viable, Callus, Fibrin/Slough, Subcutaneous material debrided: Instrument: Curette Bleeding: Minimum Hemostasis Achieved: Pressure End Time: 14:07 Procedural Pain: 0 Post Procedural Pain: 0 Response to Treatment: Procedure was tolerated well Post Debridement Measurements of Total Wound Length: (cm) 0.9 Stage: Category/Stage III Width: (cm) 1.5 Depth: (cm) 0.2 Volume: (cm) 0.212 Character of Wound/Ulcer Post Improved Debridement: Post Procedure Diagnosis Same as Pre-procedure Electronic Signature(s) Signed: 01/05/2017 4:34:43 PM By: Baltazar Najjarobson, [name] MD Signed: 01/05/2017 4:37:06 PM By: Curtis Sitesorthy, Joanna Entered By: Baltazar Najjarobson, [name] on 01/05/2017 14:47:38 Slinger, Gusta ([id number]) EdgewoodHEELY, Chazlyn ([id number]) -------------------------------------------------------------------------------- HPI Details Patient Name: [name] LoboHEELY, [name] Date of Service: 01/05/2017 1:30 PM Medical Record Patient Account Number: [id number] Number: Treating RN: Curtis Sitesorthy, Joanna [birth year] (80 y.o. Other Clinician: Date of Birth/Sex: Female) Treating ROBSON, [name] Primary Care Provider: Duncan Dullullo, Teresa Provider/Extender: G Referring Provider: Denton Brickullo, Teresa Weeks in Treatment: 20 History of Present Illness HPI Description: 08/18/16; this is a 81 year old woman who is a type II diabetic not currently on any treatment. She is also listed as having Alzheimer's disease hypertension. She has a history of chronic venous  insufficiency with leg wounds in the past but no history of wounds in her feet. In terms of her diabetes at one point she was on insulin however she is no longer on any current treatment, her daughter who is providing most of the history is unaware of her hemoglobin A1c. She has stage IV chronic renal failure and follows with nephrology. The history provided by her daughter is that she has had a wound on the left heel perhaps dating back to last May/17. At that point she was in the hospital predominantly with psychiatric issues and was [facility].  This was treated with some form of foam dressing and may have actually healed however she was readmitted to hospital in January with Escherichia coli sepsis felt to be secondary to a UTI. Since then she is in [facility] skilled facility. Apparently this timeframe both the left heel and right heel reopened or at least the daughter became aware that they were both open. The exact timeframe isn't really certain Her ABIs in this clinic were 1.08 on the right 1.25 on the left. Looking through [employer] doesn't really provide any useful information. She did have venous ultrasounds in 2016 that did not show a DVT. I don't see a recent hemoglobin A1c 08/25/16; from last week we have been using Santyl to both heels. Apparently the nursing home didn't get an x-ray of both heels [[facility]] and the daughter states that there was "no injury to bone" but for some reason we haven't been able to get the official report from them. 09/01/16; continued improvement in both wounds on her bilateral heels using Santyl. X-ray report was negative for osteomyelitis 09/08/16; patient from [facility] skilled facility. Using Santyl on both her heel wounds which are fortunately not on the plantar surface. 09/15/16; patient is from [facility] skilled facility. She has been using Santyl on both her pressure areas which were stage III. Fortunately  these or not on the plantar surface. 09/22/16; patient arrives today with a much less viable looking surface on her heels. We had been using Santyl with good improvement however unchanged either fair last week. This may be unrelated however she required debridement today. 09/29/16; patient arrives with a better looking surface on the left heel unfortunately this is a more substantial wound. The area on the right lateral calcaneus again covered in a nonviable necrotic surface. We have been making good progress with Santyl. I think I'm going to need to go back to a debriding agent. 10/06/16; still nonviable surface material over the right greater than the left heel. Apparently the facility where this woman resides did not have Iodoflex so rather than calling the clinic they decided to make some cocktail of material and then settled on Hydrofera Blue. Patient is really generally made pretty good progress versus when she came in her first however still has too much nonviable surface especially on the [facility] [place] ([id number]) right. 10/13/16; she arrives today with Iodoflex on both heels. This was the dressing that we couldn't seem to get last week. Her daughter tells [place] that where they were traveling to [place] last weekend she actually use Santyl. Both wounds are in better condition today and didn't seem to require debridement however I'm not exactly sure what is been most helpful 10/20/16; using Santyl to both heels better condition. Nursing reports green drainage 11/03/16 patient wounds appeared to be doing fairly well on evaluation today she seen along with her daughter in the office today. She does have slough covering the wound unfortunately no evidence of infection. 11/10/16; both wounds appear to be making nice progress. This is on the bilateral heels 11/24/16; he also continued to be making nice progress. Wounds are smaller. Using Hydrofera Blue. Orders are to change daily in the nursing home  although according to her daughter they're actually being changed to her 3 times a week which is probably satisfactory 12/08/16 on evaluation today patient bilateral hills appear to be making improvement. Improvement is somewhat slow but nonetheless week by week we are noting some change. Overall I'm pleased with the progress that  we have seen. Nonetheless patient states that she really would like for this to already be completely healed and closed which obviously it is not. She wonders how long it's gonna be before she can get back to normal walking and wearing shoes. No fevers, chills, nausea, or vomiting noted at this time. Secondary to mental status patient is unable to rate or describe her pain. 12/22/16; patient continues to make nice progress. The area on the right lateral calcaneus as improved and dimensions quite a bit since the last time I saw this, the area on the left is more substantial but also seems to be making improvement. No debridement was required in any area 01/05/17; the patient came in with the right lateral calcaneus covered an eschar. After removal there is still a small open area here. The area on the left still required debridement of circumferential senescent edges of tissue and nonviable surface material. We've been using Hydrofera Blue and making progress albeit slowly Electronic Signature(s) Signed: 01/05/2017 4:34:43 PM By: Baltazar Najjar MD Entered By: Baltazar Najjar on 01/05/2017 14:48:35 Zoeller, Genavie ([id number]) -------------------------------------------------------------------------------- Physical Exam Details Patient Name: [name] Date of Service: 01/05/2017 1:30 PM Medical Record Patient Account Number: [id number] Number: Treating RN: Curtis Sites January 13, 1936 (80 y.o. Other Clinician: Date of Birth/Sex: Female) Treating ROBSON, [name] Primary Care Provider: Duncan Dull Provider/Extender: G Referring Provider: Denton Brick in Treatment: 20 Constitutional Sitting or standing Blood Pressure is within target range for patient.. Pulse regular and within target range for patient.Marland Kitchen Respirations regular, non-labored and within target range.. Temperature is normal and within the target range for the patient.Marland Kitchen appears in no distress. Respiratory Respiratory effort is easy and symmetric bilaterally. Rate is normal at rest and on room air.. Cardiovascular Pedal pulses palpable and strong bilaterally.. Notes Wound exam; patient's wounds appeared to be improved. oUsing a #5 curet the area on the right heel with the bright of surface eschar and nonviable subcutaneous tissue there is only a small open area from remaining here. Hemostasis with direct pressure oOn the left heel there is a more substantial wound with raised senescent edges of skin and subcutaneous tissue removed, necrotic surface material also. Hemostasis with direct pressure oNo evidence of infection Electronic Signature(s) Signed: 01/05/2017 4:34:43 PM By: Baltazar Najjar MD Entered By: Baltazar Najjar on 01/05/2017 14:50:12 Olivia Lopez de Gutierrez, Jaleea ([id number]) -------------------------------------------------------------------------------- Physician Orders Details Patient Name: [name] Date of Service: 01/05/2017 1:30 PM Medical Record Patient Account Number: [id number] Number: Treating RN: Curtis Sites 07-24-1935 (80 y.o. Other Clinician: Date of Birth/Sex: Female) Treating ROBSON, [name] Primary Care Provider: Duncan Dull Provider/Extender: G Referring Provider: Denton Brick in Treatment: 20 Verbal / Phone Orders: No Diagnosis Coding Wound Cleansing Wound #1 Right Calcaneus o Clean wound with Normal Saline. o May Shower, gently pat wound dry prior to applying new dressing. Wound #2 Left Calcaneus o Clean wound with Normal Saline. o May Shower, gently pat wound dry prior to applying new  dressing. Anesthetic Wound #1 Right Calcaneus o Topical Lidocaine 4% cream applied to wound bed prior to debridement Wound #2 Left Calcaneus o Topical Lidocaine 4% cream applied to wound bed prior to debridement Primary Wound Dressing Wound #1 Right Calcaneus o Hydrafera Blue Wound #2 Left Calcaneus o Hydrafera Blue Secondary Dressing Wound #1 Right Calcaneus o Dry Gauze o Conform/Kerlix o Foam - heel cups o Other - stretch netting #4 (please order for pt...daughter is aware) Wound #2 Left Calcaneus o Dry Gauze o Conform/Kerlix o Foam - heel  cups Ballin, Ayauna ([id number]) o Other - stretch netting #4 (please order for pt...daughter is aware) Dressing Change Frequency Wound #1 Right Calcaneus o Change dressing every day. Wound #2 Left Calcaneus o Change dressing every day. Follow-up Appointments Wound #1 Right Calcaneus o Return Appointment in 2 weeks. Wound #2 Left Calcaneus o Return Appointment in 2 weeks. Edema Control Wound #1 Right Calcaneus o Elevate legs to the level of the heart and pump ankles as often as possible Wound #2 Left Calcaneus o Elevate legs to the level of the heart and pump ankles as often as possible Off-Loading Wound #1 Right Calcaneus o Turn and reposition every 2 hours o Other: - wear heel protector boots at al times in bed, float heels while in bed, Wound #2 Left Calcaneus o Turn and reposition every 2 hours o Other: - wear heel protector boots at al times in bed, float heels while in bed, Additional Orders / Instructions Wound #1 Right Calcaneus o Increase protein intake. o Other: - please add vitamin A, vitamin C and zinc supplements to your diet Wound #2 Left Calcaneus o Increase protein intake. o Other: - please add vitamin A, vitamin C and zinc supplements to your diet Electronic Signature(s) Signed: 01/05/2017 4:34:43 PM By: Baltazar Najjar MD Signed: 01/05/2017 4:37:06 PM By:  Curtis Sites Entered By: Curtis Sites on 01/05/2017 14:08:29 [place], Seher ([id number]) Pence, Janiqua ([id number]) -------------------------------------------------------------------------------- Problem List Details Patient Name: [name] Date of Service: 01/05/2017 1:30 PM Medical Record Patient Account Number: [id number] Number: Treating RN: Curtis Sites 11-09-1935 (80 y.o. Other Clinician: Date of Birth/Sex: Female) Treating ROBSON, [name] Primary Care Provider: Duncan Dull Provider/Extender: G Referring Provider: Denton Brick in Treatment: 20 Active Problems ICD-10 Encounter Code Description Active Date Diagnosis E11.621 Type 2 diabetes mellitus with foot ulcer 08/18/2016 Yes L89.623 Pressure ulcer of left heel, stage 3 08/18/2016 Yes L89.613 Pressure ulcer of right heel, stage 3 08/18/2016 Yes Inactive Problems Resolved Problems Electronic Signature(s) Signed: 01/05/2017 4:34:43 PM By: Baltazar Najjar MD Entered By: Baltazar Najjar on 01/05/2017 14:46:48 Humbarger, Krissa ([id number]) -------------------------------------------------------------------------------- Progress Note Details Patient Name: [name] Date of Service: 01/05/2017 1:30 PM Medical Record Patient Account Number: [id number] Number: Treating RN: Curtis Sites [birth year] (80 y.o. Other Clinician: Date of Birth/Sex: Female) Treating ROBSON, [name] Primary Care Provider: Duncan Dull Provider/Extender: G Referring Provider: Denton Brick in Treatment: 20 Subjective History of Present Illness (HPI) 08/18/16; this is a 81 year old woman who is a type II diabetic not currently on any treatment. She is also listed as having Alzheimer's disease hypertension. She has a history of chronic venous insufficiency with leg wounds in the past but no history of wounds in her feet. In terms of her diabetes at one point she was on insulin however she is no longer on  any current treatment, her daughter who is providing most of the history is unaware of her hemoglobin A1c. She has stage IV chronic renal failure and follows with nephrology. The history provided by her daughter is that she has had a wound on the left heel perhaps dating back to last May/17. At that point she was in the hospital predominantly with psychiatric issues and was discharged to [facility]. This was treated with some form of foam dressing and may have actually healed however she was readmitted to hospital in January with Escherichia coli sepsis felt to be secondary to a UTI. Since then she is in [facility] skilled facility. Apparently this timeframe both the left heel  and right heel reopened or at least the daughter became aware that they were both open. The exact timeframe isn't really certain Her ABIs in this clinic were 1.08 on the right 1.25 on the left. Looking through [employer] doesn't really provide any useful information. She did have venous ultrasounds in 2016 that did not show a DVT. I don't see a recent hemoglobin A1c 08/25/16; from last week we have been using Santyl to both heels. Apparently the nursing home didn't get an x-ray of both heels [[facility]] and the daughter states that there was "no injury to bone" but for some reason we haven't been able to get the official report from them. 09/01/16; continued improvement in both wounds on her bilateral heels using Santyl. X-ray report was negative for osteomyelitis 09/08/16; patient from [facility] skilled facility. Using Santyl on both her heel wounds which are fortunately not on the plantar surface. 09/15/16; patient is from [facility] skilled facility. She has been using Santyl on both her pressure areas which were stage III. Fortunately these or not on the plantar surface. 09/22/16; patient arrives today with a much less viable looking surface on her heels. We had been using Santyl with good  improvement however unchanged either fair last week. This may be unrelated however she required debridement today. 09/29/16; patient arrives with a better looking surface on the left heel unfortunately this is a more substantial wound. The area on the right lateral calcaneus again covered in a nonviable necrotic surface. We have been making good progress with Santyl. I think I'm going to need to go back to a debriding agent. 10/06/16; still nonviable surface material over the right greater than the left heel. Apparently the facility where this woman resides did not have Iodoflex so rather than calling the clinic they decided to make some cocktail of material and then settled on Hydrofera Blue. Patient is really generally made pretty good progress versus when she came in her first however still has too much nonviable surface especially on the [facility] [place] ([id number]) right. 10/13/16; she arrives today with Iodoflex on both heels. This was the dressing that we couldn't seem to get last week. Her daughter tells [place] that where they were traveling to [place] last weekend she actually use Santyl. Both wounds are in better condition today and didn't seem to require debridement however I'm not exactly sure what is been most helpful 10/20/16; using Santyl to both heels better condition. Nursing reports green drainage 11/03/16 patient wounds appeared to be doing fairly well on evaluation today she seen along with her daughter in the office today. She does have slough covering the wound unfortunately no evidence of infection. 11/10/16; both wounds appear to be making nice progress. This is on the bilateral heels 11/24/16; he also continued to be making nice progress. Wounds are smaller. Using Hydrofera Blue. Orders are to change daily in the nursing home although according to her daughter they're actually being changed to her 3 times a week which is probably satisfactory 12/08/16 on evaluation today patient  bilateral hills appear to be making improvement. Improvement is somewhat slow but nonetheless week by week we are noting some change. Overall I'm pleased with the progress that we have seen. Nonetheless patient states that she really would like for this to already be completely healed and closed which obviously it is not. She wonders how long it's gonna be before she can get back to normal walking and wearing shoes. No fevers, chills, nausea, or vomiting  noted at this time. Secondary to mental status patient is unable to rate or describe her pain. 12/22/16; patient continues to make nice progress. The area on the right lateral calcaneus as improved and dimensions quite a bit since the last time I saw this, the area on the left is more substantial but also seems to be making improvement. No debridement was required in any area 01/05/17; the patient came in with the right lateral calcaneus covered an eschar. After removal there is still a small open area here. The area on the left still required debridement of circumferential senescent edges of tissue and nonviable surface material. We've been using Hydrofera Blue and making progress albeit slowly Objective Constitutional Sitting or standing Blood Pressure is within target range for patient.. Pulse regular and within target range for patient.Marland Kitchen Respirations regular, non-labored and within target range.. Temperature is normal and within the target range for the patient.Marland Kitchen appears in no distress. Vitals Time Taken: 1:23 PM, Height: 66 in, Weight: 140.8 lbs, BMI: 22.7, Temperature: 97.5 F, Pulse: 70 bpm, Respiratory Rate: 18 breaths/min, Blood Pressure: 131/47 mmHg. Respiratory Respiratory effort is easy and symmetric bilaterally. Rate is normal at rest and on room air.. Cardiovascular Pedal pulses palpable and strong bilaterally.Fayrene Helper, Riannon ([id number]) General Notes: Wound exam; patient's wounds appeared to be improved. Using a #5 curet the  area on the right heel with the bright of surface eschar and nonviable subcutaneous tissue there is only a small open area from remaining here. Hemostasis with direct pressure On the left heel there is a more substantial wound with raised senescent edges of skin and subcutaneous tissue removed, necrotic surface material also. Hemostasis with direct pressure No evidence of infection Integumentary (Hair, Skin) Wound #1 status is Open. Original cause of wound was Pressure Injury. The wound is located on the Right Calcaneus. The wound measures 0.1cm length x 0.7cm width x 0.1cm depth; 0.055cm^2 area and 0.005cm^3 volume. The wound is limited to skin breakdown. There is no tunneling or undermining noted. There is a large amount of serous drainage noted. Foul odor after cleansing was noted. The wound margin is flat and intact. There is large (67-100%) pink granulation within the wound bed. There is a small (1-33%) amount of necrotic tissue within the wound bed including Eschar and Adherent Slough. The periwound skin appearance did not exhibit: Callus, Crepitus, Excoriation, Induration, Rash, Scarring, Dry/Scaly, Maceration, Atrophie Blanche, Cyanosis, Ecchymosis, Hemosiderin Staining, Mottled, Pallor, Rubor, Erythema. Periwound temperature was noted as No Abnormality. The periwound has tenderness on palpation. Wound #2 status is Open. Original cause of wound was Pressure Injury. The wound is located on the Left Calcaneus. The wound measures 0.9cm length x 1.5cm width x 0.1cm depth; 1.06cm^2 area and 0.106cm^3 volume. The wound is limited to skin breakdown. There is no tunneling or undermining noted. There is a large amount of serous drainage noted. Foul odor after cleansing was noted. The wound margin is flat and intact. There is large (67-100%) pink granulation within the wound bed. There is a small (1-33%) amount of necrotic tissue within the wound bed including Eschar and Adherent Slough. The  periwound skin appearance did not exhibit: Callus, Crepitus, Excoriation, Induration, Rash, Scarring, Dry/Scaly, Maceration, Atrophie Blanche, Cyanosis, Ecchymosis, Hemosiderin Staining, Mottled, Pallor, Rubor, Erythema. Periwound temperature was noted as No Abnormality. The periwound has tenderness on palpation. Assessment Active Problems ICD-10 E11.621 - Type 2 diabetes mellitus with foot ulcer L89.623 - Pressure ulcer of left heel, stage 3 L89.613 - Pressure ulcer of right  heel, stage 3 Procedures Wound #1 Pre-procedure diagnosis of Wound #1 is a Pressure Ulcer located on the Right Calcaneus . There was a Crocker, Ambria ([id number]) Skin/Subcutaneous Tissue Debridement ([id number]) debridement with total area of 0.07 sq cm performed by Maxwell Caul, MD. with the following instrument(s): Curette to remove Viable and Non-Viable tissue/material including Fibrin/Slough, Callus, and Subcutaneous after achieving pain control using Lidocaine 4% Topical Solution. A time out was conducted at 14:02, prior to the start of the procedure. A Minimum amount of bleeding was controlled with Pressure. The procedure was tolerated well with a pain level of 0 throughout and a pain level of 0 following the procedure. Post Debridement Measurements: 0.3cm length x 0.7cm width x 0.1cm depth; 0.016cm^3 volume. Post debridement Stage noted as Category/Stage III. Character of Wound/Ulcer Post Debridement is improved. Post procedure Diagnosis Wound #1: Same as Pre-Procedure Wound #2 Pre-procedure diagnosis of Wound #2 is a Pressure Ulcer located on the Left Calcaneus . There was a Skin/Subcutaneous Tissue Debridement ([id number]) debridement with total area of 1.35 sq cm performed by Maxwell Caul, MD. with the following instrument(s): Curette to remove Viable and Non-Viable tissue/material including Fibrin/Slough, Callus, and Subcutaneous after achieving pain control using Lidocaine 4% Topical  Solution. A time out was conducted at 14:05, prior to the start of the procedure. A Minimum amount of bleeding was controlled with Pressure. The procedure was tolerated well with a pain level of 0 throughout and a pain level of 0 following the procedure. Post Debridement Measurements: 0.9cm length x 1.5cm width x 0.2cm depth; 0.212cm^3 volume. Post debridement Stage noted as Category/Stage III. Character of Wound/Ulcer Post Debridement is improved. Post procedure Diagnosis Wound #2: Same as Pre-Procedure Plan Wound Cleansing: Wound #1 Right Calcaneus: Clean wound with Normal Saline. May Shower, gently pat wound dry prior to applying new dressing. Wound #2 Left Calcaneus: Clean wound with Normal Saline. May Shower, gently pat wound dry prior to applying new dressing. Anesthetic: Wound #1 Right Calcaneus: Topical Lidocaine 4% cream applied to wound bed prior to debridement Wound #2 Left Calcaneus: Topical Lidocaine 4% cream applied to wound bed prior to debridement Primary Wound Dressing: Wound #1 Right Calcaneus: Hydrafera Blue Wound #2 Left Calcaneus: Hydrafera Blue Secondary Dressing: Hodsdon, Deronda ([id number]) Wound #1 Right Calcaneus: Dry Gauze Conform/Kerlix Foam - heel cups Other - stretch netting #4 (please order for pt...daughter is aware) Wound #2 Left Calcaneus: Dry Gauze Conform/Kerlix Foam - heel cups Other - stretch netting #4 (please order for pt...daughter is aware) Dressing Change Frequency: Wound #1 Right Calcaneus: Change dressing every day. Wound #2 Left Calcaneus: Change dressing every day. Follow-up Appointments: Wound #1 Right Calcaneus: Return Appointment in 2 weeks. Wound #2 Left Calcaneus: Return Appointment in 2 weeks. Edema Control: Wound #1 Right Calcaneus: Elevate legs to the level of the heart and pump ankles as often as possible Wound #2 Left Calcaneus: Elevate legs to the level of the heart and pump ankles as often as  possible Off-Loading: Wound #1 Right Calcaneus: Turn and reposition every 2 hours Other: - wear heel protector boots at al times in bed, float heels while in bed, Wound #2 Left Calcaneus: Turn and reposition every 2 hours Other: - wear heel protector boots at al times in bed, float heels while in bed, Additional Orders / Instructions: Wound #1 Right Calcaneus: Increase protein intake. Other: - please add vitamin A, vitamin C and zinc supplements to your diet Wound #2 Left Calcaneus: Increase protein intake. Other: - please add vitamin  A, vitamin C and zinc supplements to your diet #1 continue Hydrofera Blue, heel cups, kerlix and conform Electronic Carleta Woodrow, Mazelle ([id number]) Signed: 01/05/2017 4:34:43 PM By: Baltazar Najjar MD Entered By: Baltazar Najjar on 01/05/2017 14:50:53 Esham, Arizbeth ([id number]) -------------------------------------------------------------------------------- SuperBill Details Patient Name: [name] Date of Service: 01/05/2017 Medical Record Patient Account Number: [id number] Number: Treating RN: Curtis Sites [birth year] (80 y.o. Other Clinician: Date of Birth/Sex: Female) Treating ROBSON, [name] Primary Care Provider: Duncan Dull Provider/Extender: G Referring Provider: Denton Brick in Treatment: 20 Diagnosis Coding ICD-10 Codes Code Description E11.621 Type 2 diabetes mellitus with foot ulcer L89.623 Pressure ulcer of left heel, stage 3 L89.613 Pressure ulcer of right heel, stage 3 Facility Procedures CPT4 Code: [id number] Description: 11042 - DEB SUBQ TISSUE 20 SQ CM/< ICD-10 Description Diagnosis L89.623 Pressure ulcer of left heel, stage 3 L89.613 Pressure ulcer of right heel, stage 3 Modifier: Quantity: 1 Physician Procedures CPT4 Code: [id number] Description: 11042 - WC PHYS SUBQ TISS 20 SQ CM ICD-10 Description Diagnosis L89.623 Pressure ulcer of left heel, stage 3 L89.613 Pressure ulcer of right heel,  stage 3 Modifier: Quantity: 1 Electronic Signature(s) Signed: 01/05/2017 4:34:43 PM By: Baltazar Najjar MD Entered By: Baltazar Najjar on 01/05/2017 14:51:18

## 2017-01-06 NOTE — Progress Notes (Signed)
[name], Asjah ([id number]) Visit Report for 01/05/2017 Arrival Information Details Patient Name: [name] LoboCHEELY, [name] Date of Service: 01/05/2017 1:30 PM Medical Record Patient Account Number: [id number] Number: Treating RN: Curtis SitesDorthy, [name] 1935/10/03 (80 y.o. Other Clinician: Date of Birth/Sex: Female) Treating ROBSON, [name] Primary Care Efosa Treichler: Duncan Dullullo, Teresa Kemper Hochman/Extender: G Referring Brahim Dolman: Denton Brickullo, Teresa Weeks in Treatment: 20 Visit Information History Since Last Visit Added or deleted any medications: No Patient Arrived: Walker Any new allergies or adverse reactions: No Arrival Time: 13:20 Had a fall or experienced change in No Accompanied By: self activities of daily living that may affect Transfer Assistance: None risk of falls: Patient Identification Verified: Yes Signs or symptoms of abuse/neglect since last No Patient Requires Transmission-Based No visito Precautions: Hospitalized since last visit: No Patient Has Alerts: Yes Has Dressing in Place as Prescribed: Yes Patient Alerts: DM II Pain Present Now: No Electronic Signature(s) Signed: 01/05/2017 4:37:06 PM By: Curtis Sitesorthy, [name] Entered By: Curtis Sitesorthy, [name] on 01/05/2017 13:21:08 Gilcrease, Laranda ([id number]) -------------------------------------------------------------------------------- Encounter Discharge Information Details Patient Name: [name] LoboHEELY, [name] Date of Service: 01/05/2017 1:30 PM Medical Record Patient Account Number: [id number] Number: Treating RN: Curtis SitesDorthy, [name] 1935/10/03 (80 y.o. Other Clinician: Date of Birth/Sex: Female) Treating ROBSON, [name] Primary Care Hoa Deriso: Duncan Dullullo, Teresa Christella App/Extender: G Referring Lenora Gomes: Denton Brickullo, Teresa Weeks in Treatment: 20 Encounter Discharge Information Items Discharge Pain Level: 0 Discharge Condition: Stable Ambulatory Status: Walker Discharge Destination: Nursing Home Transportation: Private Auto Accompanied By: dtr Schedule  Follow-up Appointment: Yes Medication Reconciliation completed No and provided to Patient/Care Kaeleen Odom: Provided on Clinical Summary of Care: 01/05/2017 Form Type [facility] Signature[facility]) Signed: 01/05/2017 4:37:06 PM By: Curtis Sitesorthy, [name] Entered By: Curtis Sitesorthy, [name] on 01/05/2017 16:33:22 Broerman, [name] ([id number]) -------------------------------------------------------------------------------- Lower Extremity Assessment Details Patient Name: [name] LoboHEELY, [name] Date of Service: 01/05/2017 1:30 PM Medical Record Patient Account Number: [id number] Number: Treating RN: Curtis SitesDorthy, [name] 1935/10/03 (80 y.o. Other Clinician: Date of Birth/Sex: Female) Treating ROBSON, [name] Primary Care Venice Marcucci: Duncan Dullullo, Teresa Tamika Nou/Extender: G Referring Thermon Zulauf: Denton Brickullo, Teresa Weeks in Treatment: 20 Vascular Assessment Pulses: Dorsalis Pedis Palpable: [Left:Yes] [Right:Yes] Posterior Tibial Extremity colors, hair growth, and conditions: Extremity Color: [Left:Hyperpigmented] [Right:Hyperpigmented] Hair Growth on Extremity: [Left:No] [Right:No] Temperature of Extremity: [Left:Warm] [Right:Warm] Capillary Refill: [Left:< 3 seconds] [Right:< 3 seconds] Electronic Signature(s) Signed: 01/05/2017 4:37:06 PM By: Curtis Sitesorthy, [name] Entered By: Curtis Sitesorthy, [name] on 01/05/2017 13:34:32 Driggs, Makiyah ([id number]) -------------------------------------------------------------------------------- Multi Wound Chart Details Patient Name: [name] LoboHEELY, [name] Date of Service: 01/05/2017 1:30 PM Medical Record Patient Account Number: [id number] Number: Treating RN: Curtis SitesDorthy, [name] 1935/10/03 (80 y.o. Other Clinician: Date of Birth/Sex: Female) Treating ROBSON, [name] Primary Care Dray Dente: Duncan Dullullo, Teresa Briscoe Daniello/Extender: G Referring Chanah Tidmore: Denton Brickullo, Teresa Weeks in Treatment: 20 Vital Signs Height(in): 66 Pulse(bpm): 70 Weight(lbs): 140.8 Blood  Pressure 131/47 (mmHg): Body Mass Index(BMI): 23 Temperature(F): 97.5 Respiratory Rate 18 (breaths/min): Photos: [1:No Photos] [2:No Photos] [N/A:N/A] Wound Location: [1:Right Calcaneus] [2:Left Calcaneus] [N/A:N/A] Wounding Event: [1:Pressure Injury] [2:Pressure Injury] [N/A:N/A] Primary Etiology: [1:Pressure Ulcer] [2:Pressure Ulcer] [N/A:N/A] Comorbid History: [1:Cataracts, Anemia, Congestive Heart Failure, Congestive Heart Failure, Hypertension, Type II Diabetes, Gout, Dementia, Diabetes, Gout, Dementia, Neuropathy] [2:Cataracts, Anemia, Hypertension, Type II Neuropathy] [N/A:N/A] Date Acquired: [1:07/05/2016] [2:07/05/2016] [N/A:N/A] Weeks of Treatment: [1:20] [2:20] [N/A:N/A] Wound Status: [1:Open] [2:Open] [N/A:N/A] Measurements L x W x D 0.1x0.7x0.1 [2:0.9x1.5x0.1] [N/A:N/A] (cm) Area (cm) : [1:0.055] [2:1.06] [N/A:N/A] Volume (cm) : [1:0.005] [2:0.106] [N/A:N/A] % Reduction in Area: [1:99.20%] [2:90.40%] [N/A:N/A] % Reduction in Volume: 99.60% [2:95.20%] [N/A:N/A] Classification: [1:Category/Stage III] [2:Category/Stage III] [N/A:N/A] Exudate Amount: [1:Large] [2:Large] [N/A:N/A] Exudate  Type: [1:Serous] [2:Serous] [N/A:N/A] Exudate Color: [1:amber] [2:amber] [N/A:N/A] Foul Odor After [1:Yes] [2:Yes] [N/A:N/A] Cleansing: Odor Anticipated Due to No [2:No] [N/A:N/A] Product Use: Wound Margin: [1:Flat and Intact] [2:Flat and Intact] [N/A:N/A] Granulation Amount: [1:Large (67-100%)] [2:Large (67-100%)] [N/A:N/A] Granulation Quality: Pink Pink N/A Necrotic Amount: Small (1-33%) Small (1-33%) N/A Necrotic Tissue: Eschar, Adherent Slough Eschar, Adherent Slough N/A Exposed Structures: Fascia: No Fascia: No N/A Fat Layer (Subcutaneous Fat Layer (Subcutaneous Tissue) Exposed: No Tissue) Exposed: No Tendon: No Tendon: No Muscle: No Muscle: No Joint: No Joint: No Bone: No Bone: No Limited to Skin Limited to Skin Breakdown Breakdown Epithelialization: None None  N/A Debridement: Debridement (16109- Debridement (11042- N/A 11047) 11047) Pre-procedure 14:02 14:05 N/A Verification/Time Out Taken: Pain Control: Lidocaine 4% Topical Lidocaine 4% Topical N/A Solution Solution Tissue Debrided: Fibrin/Slough, Callus, Fibrin/Slough, Callus, N/A Subcutaneous Subcutaneous Level: Skin/Subcutaneous Skin/Subcutaneous N/A Tissue Tissue Debridement Area (sq 0.07 1.35 N/A cm): Instrument: Curette Curette N/A Bleeding: Minimum Minimum N/A Hemostasis Achieved: Pressure Pressure N/A Procedural Pain: 0 0 N/A Post Procedural Pain: 0 0 N/A Debridement Treatment Procedure was tolerated Procedure was tolerated N/A Response: well well Post Debridement 0.3x0.7x0.1 0.9x1.5x0.2 N/A Measurements L x W x D (cm) Post Debridement 0.016 0.212 N/A Volume: (cm) Post Debridement Category/Stage III Category/Stage III N/A Stage: Periwound Skin Texture: Excoriation: No Excoriation: No N/A Induration: No Induration: No Callus: No Callus: No Crepitus: No Crepitus: No Rash: No Rash: No Scarring: No Scarring: No Periwound Skin Maceration: No Maceration: No N/A Moisture: Dry/Scaly: No Dry/Scaly: No Periwound Skin Color: Atrophie Blanche: No Atrophie Blanche: No N/A Cyanosis: No Cyanosis: No Geigle, [name] ([id number]) Ecchymosis: No Ecchymosis: No Erythema: No Erythema: No Hemosiderin Staining: No Hemosiderin Staining: No Mottled: No Mottled: No Pallor: No Pallor: No Rubor: No Rubor: No Temperature: No Abnormality No Abnormality N/A Tenderness on Yes Yes N/A Palpation: Wound Preparation: Ulcer Cleansing: Ulcer Cleansing: N/A Rinsed/Irrigated with Rinsed/Irrigated with Saline Saline Topical Anesthetic Topical Anesthetic Applied: Other: lidocaine Applied: Other: lidocaine 4% 4% Procedures Performed: Debridement Debridement N/A Treatment Notes Electronic Signature(s) Signed: 01/05/2017 4:34:43 PM By: Baltazar Najjar MD Entered By: Baltazar Najjar  on 01/05/2017 14:47:12 Decesare, [name] ([id number]) -------------------------------------------------------------------------------- Multi-Disciplinary Care Plan Details Patient Name: [name] Date of Service: 01/05/2017 1:30 PM Medical Record Patient Account Number: [id number] Number: Treating RN: Curtis Sites April 05, 1936 (80 y.o. Other Clinician: Date of Birth/Sex: Female) Treating ROBSON, [name] Primary Care Sandhya Denherder: Duncan Dull Slevin Gunby/Extender: G Referring Jameel Quant: Denton Brick in Treatment: 20 Active Inactive ` Abuse / Safety / Falls / Self Care Management Nursing Diagnoses: Impaired physical mobility Potential for falls Goals: Patient will remain injury free Date Initiated: 08/18/2016 Target Resolution Date: 10/29/2016 Goal Status: Active Interventions: Assess fall risk on admission and as needed Notes: ` Nutrition Nursing Diagnoses: Potential for alteratiion in Nutrition/Potential for imbalanced nutrition Goals: Patient/caregiver agrees to and verbalizes understanding of need to use nutritional supplements and/or vitamins as prescribed Date Initiated: 08/18/2016 Target Resolution Date: 10/29/2016 Goal Status: Active Interventions: Assess patient nutrition upon admission and as needed per policy Notes: ` Orientation to the Wound Care Program [place], [name] ([id number]) Nursing Diagnoses: Knowledge deficit related to the wound healing center program Goals: Patient/caregiver will verbalize understanding of the Wound Healing Center Program Date Initiated: 08/18/2016 Target Resolution Date: 10/29/2016 Goal Status: Active Interventions: Provide education on orientation to the wound center Notes: ` Wound/Skin Impairment Nursing Diagnoses: Impaired tissue integrity Goals: Patient/caregiver will verbalize understanding of skin care regimen Date Initiated: 08/18/2016 Target Resolution Date: 10/29/2016 Goal Status: Active Ulcer/skin  breakdown will have a volume reduction of 30% by week 4 Date Initiated: 08/18/2016 Target Resolution Date: 10/29/2016 Goal Status: Active Ulcer/skin breakdown will have a volume reduction of 50% by week 8 Date Initiated: 08/18/2016 Target Resolution Date: 10/29/2016 Goal Status: Active Ulcer/skin breakdown will have a volume reduction of 80% by week 12 Date Initiated: 08/18/2016 Target Resolution Date: 10/29/2016 Goal Status: Active Ulcer/skin breakdown will heal within 14 weeks Date Initiated: 08/18/2016 Target Resolution Date: 10/29/2016 Goal Status: Active Interventions: Assess patient/caregiver ability to obtain necessary supplies Assess patient/caregiver ability to perform ulcer/skin care regimen upon admission and as needed Assess ulceration(s) every visit Notes: Electronic Signature(s) Signed: 01/05/2017 4:37:06 PM By: Charlotta Newton, Hennie ([id number]) Entered By: Curtis Sites on 01/05/2017 14:02:41 Thuman, [name] ([id number]) -------------------------------------------------------------------------------- Pain Assessment Details Patient Name: [name] Date of Service: 01/05/2017 1:30 PM Medical Record Patient Account Number: [id number] Number: Treating RN: Curtis Sites 11/17/1935 (80 y.o. Other Clinician: Date of Birth/Sex: Female) Treating ROBSON, [name] Primary Care Anacaren Kohan: Duncan Dull Modene Andy/Extender: G Referring Kina Shiffman: Denton Brick in Treatment: 20 Active Problems Location of Pain Severity and Description of Pain Patient Has Paino No Site Locations Pain Management and Medication Current Pain Management: Electronic Signature(s) Signed: 01/05/2017 4:37:06 PM By: Curtis Sites Entered By: Curtis Sites on 01/05/2017 13:23:14 Holan, [name] ([id number]) -------------------------------------------------------------------------------- Patient/Caregiver Education Details Patient Name: [name] Date of Service: 01/05/2017  1:30 PM Medical Record Patient Account Number: [id number] Number: Treating RN: Curtis Sites 1935/06/18 (80 y.o. Other Clinician: Date of Birth/Gender: Female) Treating ROBSON, [name] Primary Care Physician/Extender: Virgil Benedict Physician: Tania Ade in Treatment: 20 Referring Physician: Duncan Dull Education Assessment Education Provided To: Caregiver SNF nurse Education Topics Provided Wound/Skin Impairment: Handouts: Other: wound care orders Methods: Clinical cytogeneticist) Signed: 01/05/2017 4:37:06 PM By: Curtis Sites Entered By: Curtis Sites on 01/05/2017 16:33:44 Tapper, Manette ([id number]) -------------------------------------------------------------------------------- Wound Assessment Details Patient Name: [name] Date of Service: 01/05/2017 1:30 PM Medical Record Patient Account Number: [id number] Number: Treating RN: Curtis Sites 05-24-1935 (80 y.o. Other Clinician: Date of Birth/Sex: Female) Treating ROBSON, [name] Primary Care Madgeline Rayo: Duncan Dull Amea Mcphail/Extender: G Referring Kabria Hetzer: Denton Brick in Treatment: 20 Wound Status Wound Number: 1 Primary Pressure Ulcer Etiology: Wound Location: Right Calcaneus Wound Open Wounding Event: Pressure Injury Status: Date Acquired: 07/05/2016 Comorbid Cataracts, Anemia, Congestive Heart Weeks Of Treatment: 20 History: Failure, Hypertension, Type II Diabetes, Clustered Wound: No Gout, Dementia, Neuropathy Photos Photo Uploaded By: Elliot Gurney, BSN, RN, CWS, Kim on 01/05/2017 16:43:27 Wound Measurements Length: (cm) 0.1 % Reduction in Ar Width: (cm) 0.7 % Reduction in Vo Depth: (cm) 0.1 Epithelialization Area: (cm) 0.055 Tunneling: Volume: (cm) 0.005 Undermining: ea: 99.2% lume: 99.6% : None No No Wound Description Classification: Category/Stage III Foul Odor After C Wound Margin: Flat and Intact Due to Product [place] Exudate Amount: Large  Slough/Fibrino Exudate Type: Serous Exudate Color: amber leansing: Yes e: No No Wound Bed Granulation Amount: Large (67-100%) Exposed Structure Granulation Quality: Pink Fascia Exposed: No [place], [name] ([id number]) Necrotic Amount: Small (1-33%) Fat Layer (Subcutaneous Tissue) Exposed: No Necrotic Quality: Eschar, Adherent Slough Tendon Exposed: No Muscle Exposed: No Joint Exposed: No Bone Exposed: No Limited to Skin Breakdown Periwound Skin Texture Texture Color No Abnormalities Noted: No No Abnormalities Noted: No Callus: No Atrophie Blanche: No Crepitus: No Cyanosis: No Excoriation: No Ecchymosis: No Induration: No Erythema: No Rash: No Hemosiderin Staining: No Scarring: No Mottled: No Pallor: No Moisture Rubor: No No Abnormalities Noted: No Dry / Scaly: No Temperature / Pain  Maceration: No Temperature: No Abnormality Tenderness on Palpation: Yes Wound Preparation Ulcer Cleansing: Rinsed/Irrigated with Saline Topical Anesthetic Applied: Other: lidocaine 4%, Treatment Notes Wound #1 (Right Calcaneus) 1. Cleansed with: Clean wound with Normal Saline 2. Anesthetic Topical Lidocaine 4% cream to wound bed prior to debridement 4. Dressing Applied: Hydrafera Blue 5. Secondary Dressing Applied Foam Gauze and Kerlix/Conform 7. Secured with Tape Other (specify in notes) Notes heel cup, netting Electronic Signature(s) Signed: 01/05/2017 4:37:06 PM By: Curtis Sites Entered By: Curtis Sites on 01/05/2017 13:34:05 [place], [name] [place], [name] ([id number]) -------------------------------------------------------------------------------- Wound Assessment Details Patient Name: [name] Date of Service: 01/05/2017 1:30 PM Medical Record Patient Account Number: [id number] Number: Treating RN: Curtis Sites November 15, 1935 (80 y.o. Other Clinician: Date of Birth/Sex: Female) Treating ROBSON, [name] Primary Care Jayanna Kroeger: Duncan Dull Luvenia Cranford/Extender: G Referring Terron Merfeld: Denton Brick in Treatment: 20 Wound Status Wound Number: 2 Primary Pressure Ulcer Etiology: Wound Location: Left Calcaneus Wound Open Wounding Event: Pressure Injury Status: Date Acquired: 07/05/2016 Comorbid Cataracts, Anemia, Congestive Heart Weeks Of Treatment: 20 History: Failure, Hypertension, Type II Diabetes, Clustered Wound: No Gout, Dementia, Neuropathy Photos Photo Uploaded By: Elliot Gurney, BSN, RN, CWS, Kim on 01/05/2017 16:43:27 Wound Measurements Length: (cm) 0.9 % Reduction in Ar Width: (cm) 1.5 % Reduction in Vo Depth: (cm) 0.1 Epithelialization Area: (cm) 1.06 Tunneling: Volume: (cm) 0.106 Undermining: ea: 90.4% lume: 95.2% : None No No Wound Description Classification: Category/Stage III Foul Odor After C Wound Margin: Flat and Intact Due to Product [place] Exudate Amount: Large Slough/Fibrino Exudate Type: Serous Exudate Color: amber leansing: Yes e: No No Wound Bed Granulation Amount: Large (67-100%) Exposed Structure Granulation Quality: Pink Fascia Exposed: No Pun, [name] ([id number]) Necrotic Amount: Small (1-33%) Fat Layer (Subcutaneous Tissue) Exposed: No Necrotic Quality: Eschar, Adherent Slough Tendon Exposed: No Muscle Exposed: No Joint Exposed: No Bone Exposed: No Limited to Skin Breakdown Periwound Skin Texture Texture Color No Abnormalities Noted: No No Abnormalities Noted: No Callus: No Atrophie Blanche: No Crepitus: No Cyanosis: No Excoriation: No Ecchymosis: No Induration: No Erythema: No Rash: No Hemosiderin Staining: No Scarring: No Mottled: No Pallor: No Moisture Rubor: No No Abnormalities Noted: No Dry / Scaly: No Temperature / Pain Maceration: No Temperature: No Abnormality Tenderness on Palpation: Yes Wound Preparation Ulcer Cleansing: Rinsed/Irrigated with Saline Topical Anesthetic Applied: Other: lidocaine 4%, Treatment Notes Wound #2 (Left  Calcaneus) 1. Cleansed with: Clean wound with Normal Saline 2. Anesthetic Topical Lidocaine 4% cream to wound bed prior to debridement 4. Dressing Applied: Hydrafera Blue 5. Secondary Dressing Applied Foam Gauze and Kerlix/Conform 7. Secured with Tape Other (specify in notes) Notes heel cup, netting Electronic Signature(s) Signed: 01/05/2017 4:37:06 PM By: Curtis Sites Entered By: Curtis Sites on 01/05/2017 13:34:15 [place], [name] ([id number]) [place], [name] ([id number]) -------------------------------------------------------------------------------- Vitals Details Patient Name: [name] Date of Service: 01/05/2017 1:30 PM Medical Record Patient Account Number: [id number] Number: Treating RN: Curtis Sites 1935-10-16 (80 y.o. Other Clinician: Date of Birth/Sex: Female) Treating ROBSON, [name] Primary Care Karess Harner: Duncan Dull Levora Werden/Extender: G Referring Kenlee Vogt: Denton Brick in Treatment: 20 Vital Signs Time Taken: 13:23 Temperature (F): 97.5 Height (in): 66 Pulse (bpm): 70 Weight (lbs): 140.8 Respiratory Rate (breaths/min): 18 Body Mass Index (BMI): 22.7 Blood Pressure (mmHg): 131/47 Reference Range: 80 - 120 mg / dl Electronic Signature(s) Signed: 01/05/2017 4:37:06 PM By: Curtis Sites Entered By: Curtis Sites on 01/05/2017 13:24:53

## 2017-01-19 ENCOUNTER — Encounter: Payer: Medicare (Managed Care) | Admitting: Internal Medicine

## 2017-01-19 DIAGNOSIS — E11621 Type 2 diabetes mellitus with foot ulcer: Secondary | ICD-10-CM | POA: Diagnosis not present

## 2017-01-21 NOTE — Progress Notes (Signed)
[name], [name] ([id number]) Visit Report for 01/19/2017 HPI Details Patient Name: [name] Date of Service: 01/19/2017 2:30 PM Medical Record Patient Account Number: [id number] Number: Treating RN: [name] 1936/04/12 (80 y.o. Other Clinician: Date of Birth/Sex: Female) Treating [name], [name] Primary Care Provider: Duncan [name] Provider/Extender: [name] Referring Provider: Denton [name] in Treatment: 22 History of Present Illness HPI Description: 08/18/16; this is a 81 year old woman who is a type II diabetic not currently on any treatment. She is also listed as having Alzheimer's disease hypertension. She has a history of chronic venous insufficiency with leg wounds in the past but no history of wounds in her feet. In terms of her diabetes at one point she was on insulin however she is no longer on any current treatment, her daughter who is providing most of the history is unaware of her hemoglobin A1c. She has stage IV chronic renal failure and follows with nephrology. The history provided by her daughter is that she has had a wound on the left heel perhaps dating back to last May/17. At that point she was in the hospital predominantly with psychiatric issues and was [facility]. This was treated with some form of foam dressing and may have actually healed however she was readmitted to hospital in January with Escherichia coli sepsis felt to be secondary to a UTI. Since then she is in [facility] skilled facility. Apparently this timeframe both the left heel and right heel reopened or at least the daughter became aware that they were both open. The exact timeframe isn't really certain Her ABIs in this clinic were 1.08 on the right 1.25 on the left. Looking through [employer] doesn't really provide any useful information. She did have venous ultrasounds in 2016 that did not show a DVT. I don't see a recent hemoglobin A1c 08/25/16; from last  week we have been using Santyl to both heels. Apparently the nursing home didn't get an x-ray of both heels [[facility]] and the daughter states that there was "no injury to bone" but for some reason we haven't been able to get the official report from them. 09/01/16; continued improvement in both wounds on her bilateral heels using Santyl. X-ray report was negative for osteomyelitis 09/08/16; patient from [facility] skilled facility. Using Santyl on both her heel wounds which are fortunately not on the plantar surface. 09/15/16; patient is from [facility] skilled facility. She has been using Santyl on both her pressure areas which were stage III. Fortunately these or not on the plantar surface. 09/22/16; patient arrives today with a much less viable looking surface on her heels. We had been using Santyl with good improvement however unchanged either fair last week. This may be unrelated however she required debridement today. 09/29/16; patient arrives with a better looking surface on the left heel unfortunately this is a more substantial wound. The area on the right lateral calcaneus again covered in a nonviable necrotic surface. We have been making good progress with Santyl. I think I'm going to need to go back to a debriding agent. 10/06/16; still nonviable surface material over the right greater than the left heel. Apparently the facility where [facility] ([id number]) this woman resides did not have Iodoflex so rather than calling the clinic they decided to make some cocktail of material and then settled on Hydrofera Blue. Patient is really generally made pretty good progress versus when she came in her first however still has too much nonviable surface especially on the  right. 10/13/16; she arrives today with Iodoflex on both heels. This was the dressing that we couldn't seem to get last week. Her daughter tells [place] that where they were traveling to [place] last weekend she  actually use Santyl. Both wounds are in better condition today and didn't seem to require debridement however I'm not exactly sure what is been most helpful 10/20/16; using Santyl to both heels better condition. Nursing reports green drainage 11/03/16 patient wounds appeared to be doing fairly well on evaluation today she seen along with her daughter in the office today. She does have slough covering the wound unfortunately no evidence of infection. 11/10/16; both wounds appear to be making nice progress. This is on the bilateral heels 11/24/16; he also continued to be making nice progress. Wounds are smaller. Using Hydrofera Blue. Orders are to change daily in the nursing home although according to her daughter they're actually being changed to her 3 times a week which is probably satisfactory 12/08/16 on evaluation today patient bilateral hills appear to be making improvement. Improvement is somewhat slow but nonetheless week by week we are noting some change. Overall I'm pleased with the progress that we have seen. Nonetheless patient states that she really would like for this to already be completely healed and closed which obviously it is not. She wonders how long it's gonna be before she can get back to normal walking and wearing shoes. No fevers, chills, nausea, or vomiting noted at this time. Secondary to mental status patient is unable to rate or describe her pain. 12/22/16; patient continues to make nice progress. The area on the right lateral calcaneus as improved and dimensions quite a bit since the last time I saw this, the area on the left is more substantial but also seems to be making improvement. No debridement was required in any area 01/05/17; the patient came in with the right lateral calcaneus covered an eschar. After removal there is still a small open area here. The area on the left still required debridement of circumferential senescent edges of tissue and nonviable surface  material. We've been using Hydrofera Blue and making progress albeit slowly 01/19/17; patient has skilled facility I've been following for substantial right and left calcaneus pressure ulcers. We've been using Hydrofera Blue. The one on the right appears to be closed today. Left down in dimensions Electronic Signature(s) Signed: 01/19/2017 4:28:59 PM By: Baltazar Najjar MD Entered By: Baltazar Najjar on 01/19/2017 15:05:42 Harkless, Valda ([id number]) -------------------------------------------------------------------------------- Physical Exam Details Patient Name: [name] Date of Service: 01/19/2017 2:30 PM Medical Record Patient Account Number: [id number] Number: Treating RN: [name] 06/08/1935 (80 y.o. Other Clinician: Date of Birth/Sex: Female) Treating [name], [name] Primary Care Provider: Duncan [name] Provider/Extender: [name] Referring Provider: Denton [name] in Treatment: 22 Constitutional Sitting or standing Blood Pressure is within target range for patient.. Pulse regular and within target range for patient.Marland Kitchen Respirations regular, non-labored and within target range.. Temperature is normal and within the target range for the patient.Marland Kitchen appears in no distress. Eyes Conjunctivae clear. No discharge. Notes Wound exam; patient's wounds have improved quite a bit last 2 weeks. The one on the lateral right heel appears to be healed somewhat of a thickened surface but no debridement was felt to be necessary oOn the left heel still a more substantial wound with raised edges but the dimensions of this are down and as long as this continues I don't think any further additional treatment will be necessary. Specifically no debridement done today  Electronic Signature(s) Signed: 01/19/2017 4:28:59 PM By: Baltazar Najjar MD Entered By: Baltazar Najjar on 01/19/2017 15:06:56 [place], Rashad  ([id number]) -------------------------------------------------------------------------------- Physician Orders Details Patient Name: [name] Date of Service: 01/19/2017 2:30 PM Medical Record Patient Account Number: [id number] Number: Treating RN: [name] 1935-05-11 (80 y.o. Other Clinician: Date of Birth/Sex: Female) Treating [name], [name] Primary Care Provider: Duncan [name] Provider/Extender: [name] Referring Provider: Denton [name] in Treatment: 22 Verbal / Phone Orders: No Diagnosis Coding Wound Cleansing Wound #1 Right Calcaneus o Clean wound with Normal Saline. o May Shower, gently pat wound dry prior to applying new dressing. Wound #2 Left Calcaneus o Clean wound with Normal Saline. o May Shower, gently pat wound dry prior to applying new dressing. Anesthetic Wound #1 Right Calcaneus o Topical Lidocaine 4% cream applied to wound bed prior to debridement Wound #2 Left Calcaneus o Topical Lidocaine 4% cream applied to wound bed prior to debridement Primary Wound Dressing Wound #1 Right Calcaneus o Foam Wound #2 Left Calcaneus o Hydrafera Blue Secondary Dressing Wound #1 Right Calcaneus o Other - telfa island dressing, stretch netting #4 (please order for pt...daughter is aware) Wound #2 Left Calcaneus o Dry Gauze o Conform/Kerlix o Foam - heel cups o Other - stretch netting #4 (please order for pt...daughter is aware) Dressing Change Frequency Lipson, Adeline ([id number]) Wound #1 Right Calcaneus o Change dressing every day. Wound #2 Left Calcaneus o Change dressing every day. Follow-up Appointments Wound #1 Right Calcaneus o Return Appointment in 2 weeks. Wound #2 Left Calcaneus o Return Appointment in 2 weeks. Edema Control Wound #1 Right Calcaneus o Elevate legs to the level of the heart and pump ankles as often as possible Wound #2 Left Calcaneus o Elevate legs to the level of the heart and  pump ankles as often as possible Off-Loading Wound #1 Right Calcaneus o Turn and reposition every 2 hours o Other: - wear heel protector boots at al times in bed, float heels while in bed, Wound #2 Left Calcaneus o Turn and reposition every 2 hours o Other: - wear heel protector boots at al times in bed, float heels while in bed, Additional Orders / Instructions Wound #1 Right Calcaneus o Increase protein intake. o Other: - please add vitamin A, vitamin C and zinc supplements to your diet Wound #2 Left Calcaneus o Increase protein intake. o Other: - please add vitamin A, vitamin C and zinc supplements to your diet Electronic Signature(s) Signed: 01/19/2017 4:28:59 PM By: Baltazar Najjar MD Signed: 01/19/2017 4:40:38 PM By: [name] Entered By: [name] on 01/19/2017 14:56:36 Ditmer, Kennley ([id number]) -------------------------------------------------------------------------------- Problem List Details Patient Name: [name] Date of Service: 01/19/2017 2:30 PM Medical Record Patient Account Number: [id number] Number: Treating RN: [name] 1935/06/02 (80 y.o. Other Clinician: Date of Birth/Sex: Female) Treating [name], [name] Primary Care Provider: Duncan [name] Provider/Extender: [name] Referring Provider: Denton [name] in Treatment: 22 Active Problems ICD-10 Encounter Code Description Active Date Diagnosis E11.621 Type 2 diabetes mellitus with foot ulcer 08/18/2016 Yes L89.623 Pressure ulcer of left heel, stage 3 08/18/2016 Yes L89.613 Pressure ulcer of right heel, stage 3 08/18/2016 Yes Inactive Problems Resolved Problems Electronic Signature(s) Signed: 01/19/2017 4:28:59 PM By: Baltazar Najjar MD Entered By: Baltazar Najjar on 01/19/2017 15:04:26 Kommer, Saranne ([id number]) -------------------------------------------------------------------------------- Progress Note Details Patient Name: [name] Date of Service:  01/19/2017 2:30 PM Medical Record Patient Account Number: [id number] Number: Treating RN: [name] 23-Jun-1935 (80 y.o. Other Clinician: Date of Birth/Sex: Female) Treating [name],  [name] Primary Care Provider: Duncan [name] Provider/Extender: [name] Referring Provider: Denton [name] in Treatment: 22 Subjective History of Present Illness (HPI) 08/18/16; this is a 81 year old woman who is a type II diabetic not currently on any treatment. She is also listed as having Alzheimer's disease hypertension. She has a history of chronic venous insufficiency with leg wounds in the past but no history of wounds in her feet. In terms of her diabetes at one point she was on insulin however she is no longer on any current treatment, her daughter who is providing most of the history is unaware of her hemoglobin A1c. She has stage IV chronic renal failure and follows with nephrology. The history provided by her daughter is that she has had a wound on the left heel perhaps dating back to last May/17. At that point she was in the hospital predominantly with psychiatric issues and was discharged to [facility]. This was treated with some form of foam dressing and may have actually healed however she was readmitted to hospital in January with Escherichia coli sepsis felt to be secondary to a UTI. Since then she is in [facility] skilled facility. Apparently this timeframe both the left heel and right heel reopened or at least the daughter became aware that they were both open. The exact timeframe isn't really certain Her ABIs in this clinic were 1.08 on the right 1.25 on the left. Looking through [employer] doesn't really provide any useful information. She did have venous ultrasounds in 2016 that did not show a DVT. I don't see a recent hemoglobin A1c 08/25/16; from last week we have been using Santyl to both heels. Apparently the nursing home didn't get an x-ray of both heels [[facility]] and the daughter states that there was "no injury to bone" but for some reason we haven't been able to get the official report from them. 09/01/16; continued improvement in both wounds on her bilateral heels using Santyl. X-ray report was negative for osteomyelitis 09/08/16; patient from [facility] skilled facility. Using Santyl on both her heel wounds which are fortunately not on the plantar surface. 09/15/16; patient is from [facility] skilled facility. She has been using Santyl on both her pressure areas which were stage III. Fortunately these or not on the plantar surface. 09/22/16; patient arrives today with a much less viable looking surface on her heels. We had been using Santyl with good improvement however unchanged either fair last week. This may be unrelated however she required debridement today. 09/29/16; patient arrives with a better looking surface on the left heel unfortunately this is a more substantial wound. The area on the right lateral calcaneus again covered in a nonviable necrotic surface. We have been making good progress with Santyl. I think I'm going to need to go back to a debriding agent. 10/06/16; still nonviable surface material over the right greater than the left heel. Apparently the facility where this woman resides did not have Iodoflex so rather than calling the clinic they decided to make some cocktail of material and then settled on Hydrofera Blue. Patient is really generally made pretty good progress versus when she came in her first however still has too much nonviable surface especially on the [facility] ([id number]) right. 10/13/16; she arrives today with Iodoflex on both heels. This was the dressing that we couldn't seem to get last week. Her daughter tells [place] that where they were traveling to [place] last weekend she actually use Santyl. Both wounds are  in better condition today and didn't seem to require debridement however I'm  not exactly sure what is been most helpful 10/20/16; using Santyl to both heels better condition. Nursing reports green drainage 11/03/16 patient wounds appeared to be doing fairly well on evaluation today she seen along with her daughter in the office today. She does have slough covering the wound unfortunately no evidence of infection. 11/10/16; both wounds appear to be making nice progress. This is on the bilateral heels 11/24/16; he also continued to be making nice progress. Wounds are smaller. Using Hydrofera Blue. Orders are to change daily in the nursing home although according to her daughter they're actually being changed to her 3 times a week which is probably satisfactory 12/08/16 on evaluation today patient bilateral hills appear to be making improvement. Improvement is somewhat slow but nonetheless week by week we are noting some change. Overall I'm pleased with the progress that we have seen. Nonetheless patient states that she really would like for this to already be completely healed and closed which obviously it is not. She wonders how long it's gonna be before she can get back to normal walking and wearing shoes. No fevers, chills, nausea, or vomiting noted at this time. Secondary to mental status patient is unable to rate or describe her pain. 12/22/16; patient continues to make nice progress. The area on the right lateral calcaneus as improved and dimensions quite a bit since the last time I saw this, the area on the left is more substantial but also seems to be making improvement. No debridement was required in any area 01/05/17; the patient came in with the right lateral calcaneus covered an eschar. After removal there is still a small open area here. The area on the left still required debridement of circumferential senescent edges of tissue and nonviable surface material. We've been using Hydrofera Blue and making progress albeit slowly 01/19/17; patient has skilled facility I've  been following for substantial right and left calcaneus pressure ulcers. We've been using Hydrofera Blue. The one on the right appears to be closed today. Left down in dimensions Objective Constitutional Sitting or standing Blood Pressure is within target range for patient.. Pulse regular and within target range for patient.Marland Kitchen Respirations regular, non-labored and within target range.. Temperature is normal and within the target range for the patient.Marland Kitchen appears in no distress. Vitals Time Taken: 2:33 PM, Height: 66 in, Weight: 140.8 lbs, BMI: 22.7, Temperature: 97.6 F, Pulse: 65 bpm, Respiratory Rate: 18 breaths/min, Blood Pressure: 125/43 mmHg. Eyes Conjunctivae clear. No discharge. Kubota, Shilpa ([id number]) General Notes: Wound exam; patient's wounds have improved quite a bit last 2 weeks. The one on the lateral right heel appears to be healed somewhat of a thickened surface but no debridement was felt to be necessary On the left heel still a more substantial wound with raised edges but the dimensions of this are down and as long as this continues I don't think any further additional treatment will be necessary. Specifically no debridement done today Integumentary (Hair, Skin) Wound #1 status is Open. Original cause of wound was Pressure Injury. The wound is located on the Right Calcaneus. The wound measures 0.1cm length x 0.1cm width x 0.1cm depth; 0.008cm^2 area and 0.001cm^3 volume. The wound is limited to skin breakdown. There is no tunneling or undermining noted. There is a large amount of serous drainage noted. Foul odor after cleansing was noted. The wound margin is flat and intact. There is large (67-100%) pink granulation within the  wound bed. There is a small (1-33%) amount of necrotic tissue within the wound bed including Eschar and Adherent Slough. The periwound skin appearance exhibited: Callus. The periwound skin appearance did not exhibit: Crepitus,  Excoriation, Induration, Rash, Scarring, Dry/Scaly, Maceration, Atrophie Blanche, Cyanosis, Ecchymosis, Hemosiderin Staining, Mottled, Pallor, Rubor, Erythema. Periwound temperature was noted as No Abnormality. The periwound has tenderness on palpation. Wound #2 status is Open. Original cause of wound was Pressure Injury. The wound is located on the Left Calcaneus. The wound measures 0.8cm length x 1.3cm width x 0.1cm depth; 0.817cm^2 area and 0.082cm^3 volume. The wound is limited to skin breakdown. There is no tunneling or undermining noted. There is a large amount of serous drainage noted. Foul odor after cleansing was noted. The wound margin is flat and intact. There is large (67-100%) pink granulation within the wound bed. There is a small (1-33%) amount of necrotic tissue within the wound bed including Eschar and Adherent Slough. The periwound skin appearance did not exhibit: Callus, Crepitus, Excoriation, Induration, Rash, Scarring, Dry/Scaly, Maceration, Atrophie Blanche, Cyanosis, Ecchymosis, Hemosiderin Staining, Mottled, Pallor, Rubor, Erythema. Periwound temperature was noted as No Abnormality. The periwound has tenderness on palpation. Assessment Active Problems ICD-10 E11.621 - Type 2 diabetes mellitus with foot ulcer L89.623 - Pressure ulcer of left heel, stage 3 L89.613 - Pressure ulcer of right heel, stage 3 Plan Wound Cleansing: Lazcano, Anaiza ([id number]) Wound #1 Right Calcaneus: Clean wound with Normal Saline. May Shower, gently pat wound dry prior to applying new dressing. Wound #2 Left Calcaneus: Clean wound with Normal Saline. May Shower, gently pat wound dry prior to applying new dressing. Anesthetic: Wound #1 Right Calcaneus: Topical Lidocaine 4% cream applied to wound bed prior to debridement Wound #2 Left Calcaneus: Topical Lidocaine 4% cream applied to wound bed prior to debridement Primary Wound Dressing: Wound #1 Right Calcaneus: Foam Wound #2 Left  Calcaneus: Hydrafera Blue Secondary Dressing: Wound #1 Right Calcaneus: Other - telfa island dressing, stretch netting #4 (please order for pt...daughter is aware) Wound #2 Left Calcaneus: Dry Gauze Conform/Kerlix Foam - heel cups Other - stretch netting #4 (please order for pt...daughter is aware) Dressing Change Frequency: Wound #1 Right Calcaneus: Change dressing every day. Wound #2 Left Calcaneus: Change dressing every day. Follow-up Appointments: Wound #1 Right Calcaneus: Return Appointment in 2 weeks. Wound #2 Left Calcaneus: Return Appointment in 2 weeks. Edema Control: Wound #1 Right Calcaneus: Elevate legs to the level of the heart and pump ankles as often as possible Wound #2 Left Calcaneus: Elevate legs to the level of the heart and pump ankles as often as possible Off-Loading: Wound #1 Right Calcaneus: Turn and reposition every 2 hours Other: - wear heel protector boots at al times in bed, float heels while in bed, Wound #2 Left Calcaneus: Turn and reposition every 2 hours Other: - wear heel protector boots at al times in bed, float heels while in bed, Additional Orders / Instructions: Wound #1 Right Calcaneus: Increase protein intake. Other: - please add vitamin A, vitamin C and zinc supplements to your diet Wound #2 Left Calcaneus: Nakayama, Tailey ([id number]) Increase protein intake. Other: - please add vitamin A, vitamin C and zinc supplements to your diet #1 I would like to keep the left heel wound with Hydrofera Blue as there appears to be gradual contraction #2 border foam to the right lateral heel to continue to protect this area. This is closed as of today Electronic Signature(s) Signed: 01/19/2017 4:28:59 PM By: Baltazar Najjar MD Entered By:  Baltazar Najjar on 01/19/2017 15:07:58 Altschuler, Veronnica ([id number]) -------------------------------------------------------------------------------- [place] Details Patient Name: SUNG, RENTON Date of Service:  01/19/2017 Medical Record Patient Account Number: [id number] Number: Treating RN: [name] 25-Feb-1936 (80 y.o. Other Clinician: Date of Birth/Sex: Female) Treating [name], [name] Primary Care Provider: Duncan [name] Provider/Extender: [name] Referring Provider: Denton [name] in Treatment: 22 Diagnosis Coding ICD-10 Codes Code Description E11.621 Type 2 diabetes mellitus with foot ulcer L89.623 Pressure ulcer of left heel, stage 3 L89.613 Pressure ulcer of right heel, stage 3 Facility Procedures CPT4 Code: [id number] Description: 99213 - WOUND CARE VISIT-LEV 3 EST PT Modifier: Quantity: 1 Physician Procedures CPT4 Code: [id number] Description: 41324 - WC PHYS LEVEL 2 - EST PT ICD-10 Description Diagnosis L89.623 Pressure ulcer of left heel, stage 3 L89.613 Pressure ulcer of right heel, stage 3 Modifier: Quantity: 1 Electronic Signature(s) Signed: 01/19/2017 4:28:59 PM By: Baltazar Najjar MD Signed: 01/19/2017 4:40:38 PM By: [name] Entered By: [name] on 01/19/2017 15:26:30

## 2017-01-21 NOTE — Progress Notes (Addendum)
Emmi, Wertheim Kayal ([id number]) Visit Report for 01/19/2017 Arrival Information Details Patient Name: [name], [name] Date of Service: 01/19/2017 2:30 PM Medical Record Patient Account Number: [id number] Number: Treating RN: Curtis Sites [birth year] (81 y.o. Other Clinician: Date of Birth/Sex: Female) Treating ROBSON, [name] Primary Care Niya Behler: Duncan Dull Acy Orsak/Extender: G Referring Elanie Hammitt: Denton Brick in Treatment: 22 Visit Information History Since Last Visit Added or deleted any medications: No Patient Arrived: Walker Any new allergies or adverse reactions: No Arrival Time: 14:30 Had a fall or experienced change in No Accompanied By: self activities of daily living that may affect Transfer Assistance: None risk of falls: Patient Identification Verified: Yes Signs or symptoms of abuse/neglect since last No Secondary Verification Process Completed: Yes visito Patient Requires Transmission-Based No Hospitalized since last visit: No Precautions: Has Dressing in Place as Prescribed: Yes Patient Has Alerts: Yes Pain Present Now: No Patient Alerts: DM II Electronic Signature(s) Signed: 01/19/2017 4:40:38 PM By: Curtis Sites Entered By: Curtis Sites on 01/19/2017 14:30:28 Mcmillion, Yurianna ([id number]) -------------------------------------------------------------------------------- Clinic Level of Care Assessment Details Patient Name: [name] Date of Service: 01/19/2017 2:30 PM Medical Record Patient Account Number: [id number] Number: Treating RN: Curtis Sites 1935/07/16 (81 y.o. Other Clinician: Date of Birth/Sex: Female) Treating ROBSON, [name] Primary Care Lacinda Curvin: Duncan Dull Kaleb Linquist/Extender: G Referring Shane Badeaux: Denton Brick in Treatment: 22 Clinic Level of Care Assessment Items TOOL 4 Quantity Score  - Use when only an EandM is performed on FOLLOW-UP visit 0 ASSESSMENTS - Nursing Assessment /  Reassessment X - Reassessment of Co-morbidities (includes updates in patient status) 1 10 X - Reassessment of Adherence to Treatment Plan 1 5 ASSESSMENTS - Wound and Skin Assessment / Reassessment  - Simple Wound Assessment / Reassessment - one wound 0 X - Complex Wound Assessment / Reassessment - multiple wounds 2 5  - Dermatologic / Skin Assessment (not related to wound area) 0 ASSESSMENTS - Focused Assessment  - Circumferential Edema Measurements - multi extremities 0  - Nutritional Assessment / Counseling / Intervention 0 X - Lower Extremity Assessment (monofilament, tuning fork, pulses) 1 5  - Peripheral Arterial Disease Assessment (using hand held doppler) 0 ASSESSMENTS - Ostomy and/or Continence Assessment and Care  - Incontinence Assessment and Management 0  - Ostomy Care Assessment and Management (repouching, etc.) 0 PROCESS - Coordination of Care X - Simple Patient / Family Education for ongoing care 1 15  - Complex (extensive) Patient / Family Education for ongoing care 0  - Staff obtains Chiropractor, Records, Test Results / Process Orders 0  - Staff telephones HHA, Nursing Homes / Clarify orders / etc 0 Beedle, Amia ([id number])  - Routine Transfer to another Facility (non-emergent condition) 0  - Routine Hospital Admission (non-emergent condition) 0  - New Admissions / Manufacturing engineer / Ordering NPWT, Apligraf, etc. 0  - Emergency Hospital Admission (emergent condition) 0 X - Simple Discharge Coordination 1 10  - Complex (extensive) Discharge Coordination 0 PROCESS - Special Needs  - Pediatric / Minor Patient Management 0  - Isolation Patient Management 0  - Hearing / Language / Visual special needs 0  - Assessment of Community assistance (transportation, D/C planning, etc.) 0  - Additional assistance / Altered mentation 0  - Support Surface(s) Assessment (bed, cushion, seat, etc.) 0 INTERVENTIONS - Wound Cleansing /  Measurement  - Simple Wound Cleansing - one wound 0 X - Complex Wound Cleansing - multiple wounds 2 5 X - Wound Imaging (photographs - any number of wounds) 1 5  -  Wound Tracing (instead of photographs) 0 []  - Simple Wound Measurement - one wound 0 X - Complex Wound Measurement - multiple wounds 2 5 INTERVENTIONS - Wound Dressings X - Small Wound Dressing one or multiple wounds 1 10 X - Medium Wound Dressing one or multiple wounds 1 15 []  - Large Wound Dressing one or multiple wounds 0 []  - Application of Medications - topical 0 []  - Application of Medications - injection 0 Colasanti, Ridley ([id number]) INTERVENTIONS - Miscellaneous []  - External ear exam 0 []  - Specimen Collection (cultures, biopsies, blood, body fluids, etc.) 0 []  - Specimen(s) / Culture(s) sent or taken to Lab for analysis 0 []  - Patient Transfer (multiple staff / Michiel Sites Lift / Similar devices) 0 []  - Simple Staple / Suture removal (25 or less) 0 []  - Complex Staple / Suture removal (26 or more) 0 []  - Hypo / Hyperglycemic Management (close monitor of Blood Glucose) 0 []  - Ankle / Brachial Index (ABI) - do not check if billed separately 0 X - Vital Signs 1 5 Has the patient been seen at the hospital within the last three years: Yes Total Score: 110 Level Of Care: New/Established - Level 3 Electronic Signature(s) Signed: 01/19/2017 4:40:38 PM By: Curtis Sites Entered By: Curtis Sites on 01/19/2017 15:26:08 Shehata, Mahnoor ([id number]) -------------------------------------------------------------------------------- Complex / Palliative Patient Assessment Details Patient Name: [name] Date of Service: 01/19/2017 2:30 PM Medical Record Patient Account Number: [id number] Number: Treating RN: Huel Coventry [birth year] (81 y.o. Other Clinician: Date of Birth/Sex: Female) Treating ROBSON, [name] Primary Care Jamere Stidham: Duncan Dull Cordarrius Coad/Extender: G Referring Melody Savidge: Denton Brick in  Treatment: 22 Palliative Management Criteria Complex Wound Management Criteria Patient has remarkable or complex co-morbidities requiring medications or treatments that extend wound healing times. Examples: o Diabetes mellitus with chronic renal failure or end stage renal disease requiring dialysis o Advanced or poorly controlled rheumatoid arthritis o Diabetes mellitus and end stage chronic obstructive pulmonary disease o Active cancer with current chemo- or radiation therapy DM with renal failure Care Approach Wound Care Plan: Complex Wound Management Electronic Signature(s) Signed: 02/01/2017 5:03:22 PM By: Elliot Gurney, BSN, RN, CWS, Kim RN, BSN Signed: 02/02/2017 4:12:50 PM By: Baltazar Najjar MD Entered By: Elliot Gurney, BSN, RN, CWS, Kim on 02/01/2017 17:03:22 [place], Marcelene ([id number]) -------------------------------------------------------------------------------- Encounter Discharge Information Details Patient Name: [name] Date of Service: 01/19/2017 2:30 PM Medical Record Patient Account Number: [id number] Number: Treating RN: Curtis Sites 10-01-1935 (80 y.o. Other Clinician: Date of Birth/Sex: Female) Treating ROBSON, [name] Primary Care Kahner Yanik: Duncan Dull Jaren Vanetten/Extender: G Referring Alailah Safley: Denton Brick in Treatment: 22 Encounter Discharge Information Items Discharge Pain Level: 0 Discharge Condition: Stable Ambulatory Status: Walker Discharge Destination: Nursing Home Transportation: Private Auto Accompanied By: dtr Schedule Follow-up Appointment: Yes Medication Reconciliation completed No and provided to Patient/Care Gillian Kluever: Provided on Clinical Summary of Care: 01/19/2017 Form Type Recipient Paper Patient [facility] Electronic Signature(s) Signed: 01/19/2017 4:40:38 PM By: Curtis Sites Entered By: Curtis Sites on 01/19/2017 15:29:07 Breeze, Lelania  ([id number]) -------------------------------------------------------------------------------- Lower Extremity Assessment Details Patient Name: [name] Date of Service: 01/19/2017 2:30 PM Medical Record Patient Account Number: [id number] Number: Treating RN: Curtis Sites 1936-03-07 (80 y.o. Other Clinician: Date of Birth/Sex: Female) Treating ROBSON, [name] Primary Care Shaelee Forni: Duncan Dull Lael Wetherbee/Extender: G Referring Aaliyha Mumford: Denton Brick in Treatment: 22 Vascular Assessment Pulses: Dorsalis Pedis Palpable: [Left:Yes] [Right:Yes] Posterior Tibial Extremity colors, hair growth, and conditions: Extremity Color: [Left:Hyperpigmented] [Right:Hyperpigmented] Hair Growth on Extremity: [Left:No] [Right:No] Temperature of Extremity: [Left:Warm] [Right:Warm]  Capillary Refill: [Left:< 3 seconds] [Right:< 3 seconds] Electronic Signature(s) Signed: 01/19/2017 4:40:38 PM By: Curtis Sites Entered By: Curtis Sites on 01/19/2017 14:48:50 Servello, Weslee ([id number]) -------------------------------------------------------------------------------- Multi Wound Chart Details Patient Name: [name] Date of Service: 01/19/2017 2:30 PM Medical Record Patient Account Number: [id number] Number: Treating RN: Curtis Sites [birth year] (80 y.o. Other Clinician: Date of Birth/Sex: Female) Treating ROBSON, [name] Primary Care Kapena Hamme: Duncan Dull Katelin Kutsch/Extender: G Referring Yanelis Osika: Denton Brick in Treatment: 22 Vital Signs Height(in): 66 Pulse(bpm): 65 Weight(lbs): 140.8 Blood Pressure 125/43 (mmHg): Body Mass Index(BMI): 23 Temperature(F): 97.6 Respiratory Rate 18 (breaths/min): Photos: [1:No Photos] [2:No Photos] [N/A:N/A] Wound Location: [1:Right Calcaneus] [2:Left Calcaneus] [N/A:N/A] Wounding Event: [1:Pressure Injury] [2:Pressure Injury] [N/A:N/A] Primary Etiology: [1:Pressure Ulcer] [2:Pressure Ulcer]  [N/A:N/A] Comorbid History: [1:Cataracts, Anemia, Congestive Heart Failure, Congestive Heart Failure, Hypertension, Type II Diabetes, Gout, Dementia, Diabetes, Gout, Dementia, Neuropathy] [2:Cataracts, Anemia, Hypertension, Type II Neuropathy] [N/A:N/A] Date Acquired: [1:07/05/2016] [2:07/05/2016] [N/A:N/A] Weeks of Treatment: [1:22] [2:22] [N/A:N/A] Wound Status: [1:Open] [2:Open] [N/A:N/A] Measurements L x W x D 0.1x0.1x0.1 [2:0.8x1.3x0.1] [N/A:N/A] (cm) Area (cm) : [1:0.008] [2:0.817] [N/A:N/A] Volume (cm) : [1:0.001] [2:0.082] [N/A:N/A] % Reduction in Area: [1:99.90%] [2:92.60%] [N/A:N/A] % Reduction in Volume: 99.90% [2:96.30%] [N/A:N/A] Classification: [1:Category/Stage III] [2:Category/Stage III] [N/A:N/A] Exudate Amount: [1:Large] [2:Large] [N/A:N/A] Exudate Type: [1:Serous] [2:Serous] [N/A:N/A] Exudate Color: [1:amber] [2:amber] [N/A:N/A] Foul Odor After [1:Yes] [2:Yes] [N/A:N/A] Cleansing: Odor Anticipated Due to No [2:No] [N/A:N/A] Product Use: Wound Margin: [1:Flat and Intact] [2:Flat and Intact] [N/A:N/A] Granulation Amount: [1:Large (67-100%)] [2:Large (67-100%)] [N/A:N/A] Granulation Quality: Pink Pink N/A Necrotic Amount: Small (1-33%) Small (1-33%) N/A Necrotic Tissue: Eschar, Adherent Slough Eschar, Adherent Slough N/A Exposed Structures: Fascia: No Fascia: No N/A Fat Layer (Subcutaneous Fat Layer (Subcutaneous Tissue) Exposed: No Tissue) Exposed: No Tendon: No Tendon: No Muscle: No Muscle: No Joint: No Joint: No Bone: No Bone: No Limited to Skin Limited to Skin Breakdown Breakdown Epithelialization: None None N/A Periwound Skin Texture: Callus: Yes Excoriation: No N/A Excoriation: No Induration: No Induration: No Callus: No Crepitus: No Crepitus: No Rash: No Rash: No Scarring: No Scarring: No Periwound Skin Maceration: No Maceration: No N/A Moisture: Dry/Scaly: No Dry/Scaly: No Periwound Skin Color: Atrophie Blanche: No Atrophie Blanche:  No N/A Cyanosis: No Cyanosis: No Ecchymosis: No Ecchymosis: No Erythema: No Erythema: No Hemosiderin Staining: No Hemosiderin Staining: No Mottled: No Mottled: No Pallor: No Pallor: No Rubor: No Rubor: No Temperature: No Abnormality No Abnormality N/A Tenderness on Yes Yes N/A Palpation: Wound Preparation: Ulcer Cleansing: Ulcer Cleansing: N/A Rinsed/Irrigated with Rinsed/Irrigated with Saline Saline Topical Anesthetic Topical Anesthetic Applied: Other: lidocaine Applied: Other: lidocaine 4% 4% Treatment Notes Electronic Signature(s) Signed: 01/19/2017 4:28:59 PM By: Baltazar Najjar MD Entered By: Baltazar Najjar on 01/19/2017 15:04:47 Verdone, Deborha ([id number]) -------------------------------------------------------------------------------- Multi-Disciplinary Care Plan Details Patient Name: [name] Date of Service: 01/19/2017 2:30 PM Medical Record Patient Account Number: [id number] Number: Treating RN: Curtis Sites 21-Dec-1935 (80 y.o. Other Clinician: Date of Birth/Sex: Female) Treating ROBSON, [name] Primary Care Seraiah Nowack: Duncan Dull Damarys Speir/Extender: G Referring Miking Usrey: Denton Brick in Treatment: 22 Active Inactive ` Abuse / Safety / Falls / Self Care Management Nursing Diagnoses: Impaired physical mobility Potential for falls Goals: Patient will remain injury free Date Initiated: 08/18/2016 Target Resolution Date: 10/29/2016 Goal Status: Active Interventions: Assess fall risk on admission and as needed Notes: ` Nutrition Nursing Diagnoses: Potential for alteratiion in Nutrition/Potential for imbalanced nutrition Goals: Patient/caregiver agrees to and verbalizes understanding of need to use nutritional supplements and/or vitamins as prescribed  Date Initiated: 08/18/2016 Target Resolution Date: 10/29/2016 Goal Status: Active Interventions: Assess patient nutrition upon admission and as needed per  policy Notes: ` Orientation to the Wound Care Program [place], Ilhan ([id number]) Nursing Diagnoses: Knowledge deficit related to the wound healing center program Goals: Patient/caregiver will verbalize understanding of the Wound Healing Center Program Date Initiated: 08/18/2016 Target Resolution Date: 10/29/2016 Goal Status: Active Interventions: Provide education on orientation to the wound center Notes: ` Wound/Skin Impairment Nursing Diagnoses: Impaired tissue integrity Goals: Patient/caregiver will verbalize understanding of skin care regimen Date Initiated: 08/18/2016 Target Resolution Date: 10/29/2016 Goal Status: Active Ulcer/skin breakdown will have a volume reduction of 30% by week 4 Date Initiated: 08/18/2016 Target Resolution Date: 10/29/2016 Goal Status: Active Ulcer/skin breakdown will have a volume reduction of 50% by week 8 Date Initiated: 08/18/2016 Target Resolution Date: 10/29/2016 Goal Status: Active Ulcer/skin breakdown will have a volume reduction of 80% by week 12 Date Initiated: 08/18/2016 Target Resolution Date: 10/29/2016 Goal Status: Active Ulcer/skin breakdown will heal within 14 weeks Date Initiated: 08/18/2016 Target Resolution Date: 10/29/2016 Goal Status: Active Interventions: Assess patient/caregiver ability to obtain necessary supplies Assess patient/caregiver ability to perform ulcer/skin care regimen upon admission and as needed Assess ulceration(s) every visit Notes: Electronic Signature(s) Signed: 01/19/2017 4:40:38 PM By: Charlotta Newton, Kaileigh ([id number]) Entered By: Curtis Sites on 01/19/2017 14:54:52 Prisk, Jaclyn ([id number]) -------------------------------------------------------------------------------- Pain Assessment Details Patient Name: [name] Date of Service: 01/19/2017 2:30 PM Medical Record Patient Account Number: [id number] Number: Treating RN: Curtis Sites Dec 16, 1935 (80 y.o. Other Clinician: Date  of Birth/Sex: Female) Treating ROBSON, [name] Primary Care Kyrel Leighton: Duncan Dull Breely Panik/Extender: G Referring Chasiti Waddington: Denton Brick in Treatment: 22 Active Problems Location of Pain Severity and Description of Pain Patient Has Paino No Site Locations Pain Management and Medication Current Pain Management: Notes Topical or injectable lidocaine is offered to patient for acute pain when surgical debridement is performed. If needed, Patient is instructed to use over the counter pain medication for the following 24-48 hours after debridement. Wound care MDs do not prescribed pain medications. Patient has chronic pain or uncontrolled pain. Patient has been instructed to make an appointment with their Primary Care Physician for pain management. Electronic Signature(s) Signed: 01/19/2017 4:40:38 PM By: Curtis Sites Entered By: Curtis Sites on 01/19/2017 14:30:49 Arriola, Hartley ([id number]) -------------------------------------------------------------------------------- Patient/Caregiver Education Details Patient Name: [name] Date of Service: 01/19/2017 2:30 PM Medical Record Patient Account Number: [id number] Number: Treating RN: Curtis Sites 24-Apr-1936 (80 y.o. Other Clinician: Date of Birth/Gender: Female) Treating ROBSON, [name] Primary Care Physician/Extender: Virgil Benedict Physician: Tania Ade in Treatment: 22 Referring Physician: Duncan Dull Education Assessment Education Provided To: Caregiver SNF nurse Education Topics Provided Wound/Skin Impairment: Handouts: Other: wound care orders Methods: Clinical cytogeneticist) Signed: 01/19/2017 4:40:38 PM By: Curtis Sites Entered By: Curtis Sites on 01/19/2017 15:29:44 [place], Murel ([id number]) -------------------------------------------------------------------------------- Wound Assessment Details Patient Name: [name] Date of Service: 01/19/2017 2:30 PM Medical Record  Patient Account Number: [id number] Number: Treating RN: Curtis Sites 1935/11/17 (80 y.o. Other Clinician: Date of Birth/Sex: Female) Treating ROBSON, [name] Primary Care Mattis Featherly: Duncan Dull Igor Bishop/Extender: G Referring Micki Cassel: Denton Brick in Treatment: 22 Wound Status Wound Number: 1 Primary Pressure Ulcer Etiology: Wound Location: Right Calcaneus Wound Open Wounding Event: Pressure Injury Status: Date Acquired: 07/05/2016 Comorbid Cataracts, Anemia, Congestive Heart Weeks Of Treatment: 22 History: Failure, Hypertension, Type II Diabetes, Clustered Wound: No Gout, Dementia, Neuropathy Photos Photo Uploaded By: Curtis Sites on 01/19/2017 16:39:31 Wound Measurements Length: (cm) 0.1 %  Reduction in A Width: (cm) 0.1 % Reduction in V Depth: (cm) 0.1 Epithelializatio Area: (cm) 0.008 Tunneling: Volume: (cm) 0.001 Undermining: rea: 99.9% olume: 99.9% n: None No No Wound Description Classification: Category/Stage III Foul Odor After Wound Margin: Flat and Intact Due to Product Exudate Amount: Large Slough/Fibrino Exudate Type: Serous Exudate Color: amber Cleansing: Yes Use: No No Wound Bed Granulation Amount: Large (67-100%) Exposed Structure Granulation Quality: Pink Fascia Exposed: No Oneil, Ahnika ([id number]) Necrotic Amount: Small (1-33%) Fat Layer (Subcutaneous Tissue) Exposed: No Necrotic Quality: Eschar, Adherent Slough Tendon Exposed: No Muscle Exposed: No Joint Exposed: No Bone Exposed: No Limited to Skin Breakdown Periwound Skin Texture Texture Color No Abnormalities Noted: No No Abnormalities Noted: No Callus: Yes Atrophie Blanche: No Crepitus: No Cyanosis: No Excoriation: No Ecchymosis: No Induration: No Erythema: No Rash: No Hemosiderin Staining: No Scarring: No Mottled: No Pallor: No Moisture Rubor: No No Abnormalities Noted: No Dry / Scaly: No Temperature / Pain Maceration: No Temperature: No  Abnormality Tenderness on Palpation: Yes Wound Preparation Ulcer Cleansing: Rinsed/Irrigated with Saline Topical Anesthetic Applied: Other: lidocaine 4%, Electronic Signature(s) Signed: 01/19/2017 4:40:38 PM By: Curtis Sites Entered By: Curtis Sites on 01/19/2017 14:47:23 Legaspi, Lety ([id number]) -------------------------------------------------------------------------------- Wound Assessment Details Patient Name: [name] Date of Service: 01/19/2017 2:30 PM Medical Record Patient Account Number: [id number] Number: Treating RN: Curtis Sites [birth year] (80 y.o. Other Clinician: Date of Birth/Sex: Female) Treating ROBSON, [name] Primary Care Makina Skow: Duncan Dull Clydell Alberts/Extender: G Referring Aleila Syverson: Denton Brick in Treatment: 22 Wound Status Wound Number: 2 Primary Pressure Ulcer Etiology: Wound Location: Left Calcaneus Wound Open Wounding Event: Pressure Injury Status: Date Acquired: 07/05/2016 Comorbid Cataracts, Anemia, Congestive Heart Weeks Of Treatment: 22 History: Failure, Hypertension, Type II Diabetes, Clustered Wound: No Gout, Dementia, Neuropathy Photos Photo Uploaded By: Curtis Sites on 01/19/2017 16:39:32 Wound Measurements Length: (cm) 0.8 % Reduction in A Width: (cm) 1.3 % Reduction in V Depth: (cm) 0.1 Epithelializatio Area: (cm) 0.817 Tunneling: Volume: (cm) 0.082 Undermining: rea: 92.6% olume: 96.3% n: None No No Wound Description Classification: Category/Stage III Foul Odor After Wound Margin: Flat and Intact Due to Product Exudate Amount: Large Slough/Fibrino Exudate Type: Serous Exudate Color: amber Cleansing: Yes Use: No No Wound Bed Granulation Amount: Large (67-100%) Exposed Structure Granulation Quality: Pink Fascia Exposed: No Handel, Elaf ([id number]) Necrotic Amount: Small (1-33%) Fat Layer (Subcutaneous Tissue) Exposed: No Necrotic Quality: Eschar, Adherent Slough Tendon Exposed:  No Muscle Exposed: No Joint Exposed: No Bone Exposed: No Limited to Skin Breakdown Periwound Skin Texture Texture Color No Abnormalities Noted: No No Abnormalities Noted: No Callus: No Atrophie Blanche: No Crepitus: No Cyanosis: No Excoriation: No Ecchymosis: No Induration: No Erythema: No Rash: No Hemosiderin Staining: No Scarring: No Mottled: No Pallor: No Moisture Rubor: No No Abnormalities Noted: No Dry / Scaly: No Temperature / Pain Maceration: No Temperature: No Abnormality Tenderness on Palpation: Yes Wound Preparation Ulcer Cleansing: Rinsed/Irrigated with Saline Topical Anesthetic Applied: Other: lidocaine 4%, Electronic Signature(s) Signed: 01/19/2017 4:40:38 PM By: Curtis Sites Entered By: Curtis Sites on 01/19/2017 14:48:33 Harnish, Constanza ([id number]) -------------------------------------------------------------------------------- Vitals Details Patient Name: [name] Date of Service: 01/19/2017 2:30 PM Medical Record Patient Account Number: [id number] Number: Treating RN: Curtis Sites [birth year] (80 y.o. Other Clinician: Date of Birth/Sex: Female) Treating ROBSON, [name] Primary Care Jeiry Birnbaum: Duncan Dull Horice Carrero/Extender: G Referring Quenna Doepke: Denton Brick in Treatment: 22 Vital Signs Time Taken: 14:33 Temperature (F): 97.6 Height (in): 66 Pulse (bpm): 65 Weight (lbs): 140.8 Respiratory Rate (breaths/min): 18 Body Mass Index (  BMI): 22.7 Blood Pressure (mmHg): 125/43 Reference Range: 80 - 120 mg / dl Electronic Signature(s) Signed: 01/19/2017 4:40:38 PM By: Curtis Sites Entered By: Curtis Sites on 01/19/2017 14:34:02

## 2017-02-02 ENCOUNTER — Encounter: Payer: Medicare (Managed Care) | Attending: Internal Medicine | Admitting: Internal Medicine

## 2017-02-02 DIAGNOSIS — E1140 Type 2 diabetes mellitus with diabetic neuropathy, unspecified: Secondary | ICD-10-CM | POA: Insufficient documentation

## 2017-02-02 DIAGNOSIS — F0280 Dementia in other diseases classified elsewhere without behavioral disturbance: Secondary | ICD-10-CM | POA: Diagnosis not present

## 2017-02-02 DIAGNOSIS — L89613 Pressure ulcer of right heel, stage 3: Secondary | ICD-10-CM | POA: Diagnosis not present

## 2017-02-02 DIAGNOSIS — E11621 Type 2 diabetes mellitus with foot ulcer: Secondary | ICD-10-CM | POA: Insufficient documentation

## 2017-02-02 DIAGNOSIS — E1122 Type 2 diabetes mellitus with diabetic chronic kidney disease: Secondary | ICD-10-CM | POA: Insufficient documentation

## 2017-02-02 DIAGNOSIS — I129 Hypertensive chronic kidney disease with stage 1 through stage 4 chronic kidney disease, or unspecified chronic kidney disease: Secondary | ICD-10-CM | POA: Insufficient documentation

## 2017-02-02 DIAGNOSIS — N184 Chronic kidney disease, stage 4 (severe): Secondary | ICD-10-CM | POA: Diagnosis not present

## 2017-02-02 DIAGNOSIS — G309 Alzheimer's disease, unspecified: Secondary | ICD-10-CM | POA: Insufficient documentation

## 2017-02-02 DIAGNOSIS — L89623 Pressure ulcer of left heel, stage 3: Secondary | ICD-10-CM | POA: Diagnosis not present

## 2017-02-03 NOTE — Progress Notes (Signed)
[name], Hakim [name] ([id number]) Visit Report for 02/02/2017 Arrival Information Details Patient Name: [name], [name] Date of Service: 02/02/2017 1:30 PM Medical Record Patient Account Number: [id number] Number: Treating RN: Curtis Sites Jun 25, 1935 (80 y.o. Other Clinician: Date of Birth/Sex: Female) Treating ROBSON, [name] Primary Care Chyla Schlender: Duncan Dull Hester Forget/Extender: G Referring Mirtha Jain: Denton Brick in Treatment: 24 Visit Information History Since Last Visit Added or deleted any medications: No Patient Arrived: Walker Any new allergies or adverse reactions: No Arrival Time: 13:27 Had a fall or experienced change in No Accompanied By: self activities of daily living that may affect Transfer Assistance: None risk of falls: Patient Identification Verified: Yes Signs or symptoms of abuse/neglect since last No Secondary Verification Process Completed: Yes visito Patient Requires Transmission-Based No Hospitalized since last visit: No Precautions: Has Dressing in Place as Prescribed: Yes Patient Has Alerts: Yes Pain Present Now: No Patient Alerts: DM II Electronic Signature(s) Signed: 02/02/2017 4:35:12 PM By: Curtis Sites Entered By: Curtis Sites on 02/02/2017 13:34:09 [name], [name] ([id number]) -------------------------------------------------------------------------------- Encounter Discharge Information Details Patient Name: [name] Date of Service: 02/02/2017 1:30 PM Medical Record Patient Account Number: [id number] Number: Treating RN: Curtis Sites 1936-04-25 (80 y.o. Other Clinician: Date of Birth/Sex: Female) Treating ROBSON, [name] Primary Care Herman Fiero: Duncan Dull Angelina Venard/Extender: G Referring Adyen Bifulco: Denton Brick in Treatment: 24 Encounter Discharge Information Items Discharge Pain Level: 0 Discharge Condition: Stable Ambulatory Status: Walker Discharge Destination: Nursing  Home Transportation: Private Auto Accompanied By: dtr Schedule Follow-up Appointment: Yes Medication Reconciliation completed and provided to Patient/Care No Deroy Noah: Provided on Clinical Summary of Care: 02/02/2017 Form Type Recipient Paper Patient [facility] Electronic Signature(s) Signed: 02/02/2017 4:35:12 PM By: Curtis Sites Entered By: Curtis Sites on 02/02/2017 16:21:24 [name], [name] ([id number]) -------------------------------------------------------------------------------- Lower Extremity Assessment Details Patient Name: [name] Date of Service: 02/02/2017 1:30 PM Medical Record Patient Account Number: [id number] Number: Treating RN: Curtis Sites 1935/09/28 (80 y.o. Other Clinician: Date of Birth/Sex: Female) Treating ROBSON, [name] Primary Care Sanaia Jasso: Duncan Dull Virgin Zellers/Extender: G Referring Clemence Stillings: Denton Brick in Treatment: 24 Vascular Assessment Pulses: Dorsalis Pedis Palpable: [Left:Yes] [Right:Yes] Posterior Tibial Extremity colors, hair growth, and conditions: Extremity Color: [Left:Hyperpigmented] [Right:Hyperpigmented] Hair Growth on Extremity: [Left:No] [Right:No] Temperature of Extremity: [Left:Warm] [Right:Warm] Capillary Refill: [Left:< 3 seconds] [Right:< 3 seconds] Electronic Signature(s) Signed: 02/02/2017 4:35:12 PM By: Curtis Sites Entered By: Curtis Sites on 02/02/2017 13:37:17 [name], [name] ([id number]) -------------------------------------------------------------------------------- Multi Wound Chart Details Patient Name: [name] Date of Service: 02/02/2017 1:30 PM Medical Record Patient Account Number: [id number] Number: Treating RN: Curtis Sites 11/11/1935 (80 y.o. Other Clinician: Date of Birth/Sex: Female) Treating ROBSON, [name] Primary Care Woods Gangemi: Duncan Dull Willella Harding/Extender: G Referring Jaslyn Bansal: Denton Brick in Treatment: 24 Vital Signs Height(in):  66 Pulse(bpm): 73 Weight(lbs): 140.8 Blood Pressure 127/46 (mmHg): Body Mass Index(BMI): 23 Temperature(F): 98.1 Respiratory Rate 18 (breaths/min): Photos: [1:No Photos] [2:No Photos] [N/A:N/A] Wound Location: [1:Right Calcaneus] [2:Left Calcaneus] [N/A:N/A] Wounding Event: [1:Pressure Injury] [2:Pressure Injury] [N/A:N/A] Primary Etiology: [1:Pressure Ulcer] [2:Pressure Ulcer] [N/A:N/A] Comorbid History: [1:N/A] [2:Cataracts, Anemia, Congestive Heart Failure, Hypertension, Type II Diabetes, Gout, Dementia, Neuropathy] [N/A:N/A] Date Acquired: [1:07/05/2016] [2:07/05/2016] [N/A:N/A] Weeks of Treatment: [1:24] [2:24] [N/A:N/A] Wound Status: [1:Healed - Epithelialized] [2:Open] [N/A:N/A] Measurements L x W x D 0x0x0 [2:0.8x1.2x0.1] [N/A:N/A] (cm) Area (cm) : [1:0] [2:0.754] [N/A:N/A] Volume (cm) : [1:0] [2:0.075] [N/A:N/A] % Reduction in Area: [1:100.00%] [2:93.10%] [N/A:N/A] % Reduction in Volume: 100.00% [2:96.60%] [N/A:N/A] Classification: [1:Category/Stage III] [2:Category/Stage III] [N/A:N/A] Exudate Amount: [1:N/A] [2:Large] [N/A:N/A] Exudate Type: [1:N/A] [2:Serous] [N/A:N/A]  Exudate Color: [1:N/A] [2:amber] [N/A:N/A] Foul Odor After [1:N/A] [2:Yes] [N/A:N/A] Cleansing: Odor Anticipated Due to N/A [2:No] [N/A:N/A] Product Use: Wound Margin: [1:N/A] [2:Flat and Intact] [N/A:N/A] Granulation Amount: [1:N/A] [2:Large (67-100%)] [N/A:N/A] Granulation Quality: N/A Pink N/A Necrotic Amount: N/A Small (1-33%) N/A Necrotic Tissue: N/A Eschar, Adherent Slough N/A Epithelialization: N/A None N/A Debridement: N/A Debridement ([id number]- N/A 11047) Pre-procedure N/A 13:45 N/A Verification/Time Out Taken: Pain Control: N/A Lidocaine 4% Topical N/A Solution Tissue Debrided: N/A Fibrin/Slough, Callus, N/A Subcutaneous Level: N/A Skin/Subcutaneous N/A Tissue Debridement Area (sq N/A 0.96 N/A cm): Instrument: N/A Curette N/A Bleeding: N/A Minimum N/A Hemostasis Achieved: N/A  Pressure N/A Procedural Pain: N/A 0 N/A Post Procedural Pain: N/A 0 N/A Debridement Treatment N/A Procedure was tolerated N/A Response: well Post Debridement N/A 0.9x1.3x0.2 N/A Measurements L x W x D (cm) Post Debridement N/A 0.184 N/A Volume: (cm) Post Debridement N/A Category/Stage III N/A Stage: Periwound Skin Texture: No Abnormalities Noted Excoriation: No N/A Induration: No Callus: No Crepitus: No Rash: No Scarring: No Periwound Skin No Abnormalities Noted Maceration: No N/A Moisture: Dry/Scaly: No Periwound Skin Color: No Abnormalities Noted Atrophie Blanche: No N/A Cyanosis: No Ecchymosis: No Erythema: No Hemosiderin Staining: No Mottled: No Pallor: No Rubor: No Temperature: N/A No Abnormality N/A Tenderness on No Yes N/A Palpation: Puff, Sheyli ([id number]) Wound Preparation: N/A Ulcer Cleansing: N/A Rinsed/Irrigated with Saline Topical Anesthetic Applied: Other: lidocaine 4% Procedures Performed: N/A Debridement N/A Treatment Notes Electronic Signature(s) Signed: 02/02/2017 4:12:03 PM By: Baltazar Najjar MD Entered By: Baltazar Najjar on 02/02/2017 14:03:04 Deroche, Florencia ([id number]) -------------------------------------------------------------------------------- Multi-Disciplinary Care Plan Details Patient Name: [name] Date of Service: 02/02/2017 1:30 PM Medical Record Patient Account Number: [id number] Number: Treating RN: Curtis Sites 15-Jul-1935 (80 y.o. Other Clinician: Date of Birth/Sex: Female) Treating ROBSON, [name] Primary Care Faatima Tench: Duncan Dull Anothony Bursch/Extender: G Referring [employer]: Denton Brick in Treatment: 24 Active Inactive ` Abuse / Safety / Falls / Self Care Management Nursing Diagnoses: Impaired physical mobility Potential for falls Goals: Patient will remain injury free Date Initiated: 08/18/2016 Target Resolution Date: 10/29/2016 Goal Status: Active Interventions: Assess fall  risk on admission and as needed Notes: ` Nutrition Nursing Diagnoses: Potential for alteratiion in Nutrition/Potential for imbalanced nutrition Goals: Patient/caregiver agrees to and verbalizes understanding of need to use nutritional supplements and/or vitamins as prescribed Date Initiated: 08/18/2016 Target Resolution Date: 10/29/2016 Goal Status: Active Interventions: Assess patient nutrition upon admission and as needed per policy Notes: ` Orientation to the Wound Care Program Dillard, Irina ([id number]) Nursing Diagnoses: Knowledge deficit related to the wound healing center program Goals: Patient/caregiver will verbalize understanding of the Wound Healing Center Program Date Initiated: 08/18/2016 Target Resolution Date: 10/29/2016 Goal Status: Active Interventions: Provide education on orientation to the wound center Notes: ` Wound/Skin Impairment Nursing Diagnoses: Impaired tissue integrity Goals: Patient/caregiver will verbalize understanding of skin care regimen Date Initiated: 08/18/2016 Target Resolution Date: 10/29/2016 Goal Status: Active Ulcer/skin breakdown will have a volume reduction of 30% by week 4 Date Initiated: 08/18/2016 Target Resolution Date: 10/29/2016 Goal Status: Active Ulcer/skin breakdown will have a volume reduction of 50% by week 8 Date Initiated: 08/18/2016 Target Resolution Date: 10/29/2016 Goal Status: Active Ulcer/skin breakdown will have a volume reduction of 80% by week 12 Date Initiated: 08/18/2016 Target Resolution Date: 10/29/2016 Goal Status: Active Ulcer/skin breakdown will heal within 14 weeks Date Initiated: 08/18/2016 Target Resolution Date: 10/29/2016 Goal Status: Active Interventions: Assess patient/caregiver ability to obtain necessary supplies Assess patient/caregiver ability to perform ulcer/skin care regimen upon admission and  as needed Assess ulceration(s) every visit Notes: Electronic Signature(s) Signed: 02/02/2017 4:35:12 PM  By: Charlotta Newton, Jenalee ([id number]) Entered By: Curtis Sites on 02/02/2017 13:42:46 Roulston, Vallerie ([id number]) -------------------------------------------------------------------------------- Pain Assessment Details Patient Name: [name] Date of Service: 02/02/2017 1:30 PM Medical Record Patient Account Number: [id number] Number: Treating RN: Curtis Sites 12/02/1935 (80 y.o. Other Clinician: Date of Birth/Sex: Female) Treating ROBSON, [name] Primary Care Adreana Coull: Duncan Dull Abryana Lykens/Extender: G Referring Hadia Minier: Denton Brick in Treatment: 24 Active Problems Location of Pain Severity and Description of Pain Patient Has Paino No Site Locations Pain Management and Medication Current Pain Management: Notes Topical or injectable lidocaine is offered to patient for acute pain when surgical debridement is performed. If needed, Patient is instructed to use over the counter pain medication for the following 24-48 hours after debridement. Wound care MDs do not prescribed pain medications. Patient has chronic pain or uncontrolled pain. Patient has been instructed to make an appointment with their Primary Care Physician for pain management. Electronic Signature(s) Signed: 02/02/2017 4:35:12 PM By: Curtis Sites Entered By: Curtis Sites on 02/02/2017 13:35:44 Mcginnis, Khushboo ([id number]) -------------------------------------------------------------------------------- Patient/Caregiver Education Details Patient Name: [name] Date of Service: 02/02/2017 1:30 PM Medical Record Patient Account Number: [id number] Number: Treating RN: Curtis Sites 09-Dec-1935 (80 y.o. Other Clinician: Date of Birth/Gender: Female) Treating ROBSON, [name] Primary Care Physician/Extender: Virgil Benedict Physician: Tania Ade in Treatment: 24 Referring Physician: Duncan Dull Education Assessment Education Provided To: Caregiver SNF nurse via  written orders Education Topics Provided Wound/Skin Impairment: Handouts: Other: wound care orders Methods: Printed Electronic Signature(s) Signed: 02/02/2017 4:35:12 PM By: Curtis Sites Entered By: Curtis Sites on 02/02/2017 16:21:48 Orsak, Jamel ([id number]) -------------------------------------------------------------------------------- Wound Assessment Details Patient Name: [name] Date of Service: 02/02/2017 1:30 PM Medical Record Patient Account Number: [id number] Number: Treating RN: Curtis Sites 1935/05/29 (80 y.o. Other Clinician: Date of Birth/Sex: Female) Treating ROBSON, [name] Primary Care Pao Haffey: Duncan Dull Chessica Audia/Extender: G Referring Franki Stemen: Denton Brick in Treatment: 24 Wound Status Wound Number: 1 Primary Etiology: Pressure Ulcer Wound Location: Right Calcaneus Wound Status: Healed - Epithelialized Wounding Event: Pressure Injury Date Acquired: 07/05/2016 Weeks Of Treatment: 24 Clustered Wound: No Wound Measurements Length: (cm) 0 Width: (cm) 0 Depth: (cm) 0 Area: (cm) 0 Volume: (cm) 0 % Reduction in Area: 100% % Reduction in Volume: 100% Wound Description Classification: Category/Stage III Periwound Skin Texture Texture Color No Abnormalities Noted: No No Abnormalities Noted: No Moisture No Abnormalities Noted: No Electronic Signature(s) Signed: 02/02/2017 4:35:12 PM By: Curtis Sites Entered By: Curtis Sites on 02/02/2017 13:36:05 Lapre, Jacarra ([id number]) -------------------------------------------------------------------------------- Wound Assessment Details Patient Name: [name] Date of Service: 02/02/2017 1:30 PM Medical Record Patient Account Number: [id number] Number: Treating RN: Curtis Sites 09-28-1935 (80 y.o. Other Clinician: Date of Birth/Sex: Female) Treating ROBSON, [name] Primary Care Analeah Brame: Duncan Dull Kipling Graser/Extender: G Referring Jamason Peckham:  Denton Brick in Treatment: 24 Wound Status Wound Number: 2 Primary Pressure Ulcer Etiology: Wound Location: Left Calcaneus Wound Open Wounding Event: Pressure Injury Status: Date Acquired: 07/05/2016 Comorbid Cataracts, Anemia, Congestive Heart Weeks Of Treatment: 24 History: Failure, Hypertension, Type II Diabetes, Clustered Wound: No Gout, Dementia, Neuropathy Wound Measurements Length: (cm) 0.8 Width: (cm) 1.2 Depth: (cm) 0.1 Area: (cm) 0.754 Volume: (cm) 0.075 % Reduction in Area: 93.1% % Reduction in Volume: 96.6% Epithelialization: None Tunneling: No Undermining: No Wound Description Classification: Category/Stage III Wound Margin: Flat and Intact Exudate Amount: Large Exudate Type: Serous Exudate Color: amber Foul Odor After Cleansing: Yes Due to Product  Use: No Slough/Fibrino No Wound Bed Granulation Amount: Large (67-100%) Exposed Structure Granulation Quality: Pink Fascia Exposed: No Necrotic Amount: Small (1-33%) Fat Layer (Subcutaneous Tissue) Exposed: No Necrotic Quality: Eschar, Adherent Slough Tendon Exposed: No Muscle Exposed: No Joint Exposed: No Bone Exposed: No Limited to Skin Breakdown Periwound Skin Texture Texture Color No Abnormalities Noted: No No Abnormalities Noted: No Callus: No Atrophie Blanche: No Meixner, Gilbert ([id number]) Crepitus: No Cyanosis: No Excoriation: No Ecchymosis: No Induration: No Erythema: No Rash: No Hemosiderin Staining: No Scarring: No Mottled: No Pallor: No Moisture Rubor: No No Abnormalities Noted: No Dry / Scaly: No Temperature / Pain Maceration: No Temperature: No Abnormality Tenderness on Palpation: Yes Wound Preparation Ulcer Cleansing: Rinsed/Irrigated with Saline Topical Anesthetic Applied: Other: lidocaine 4%, Treatment Notes Wound #2 (Left Calcaneus) 1. Cleansed with: Clean wound with Normal Saline 2. Anesthetic Topical Lidocaine 4% cream to wound bed prior to  debridement 4. Dressing Applied: Hydrafera Blue 5. Secondary Dressing Applied Dry Gauze Foam Kerlix/Conform 7. Secured with Tape Notes heel cup, netting Electronic Signature(s) Signed: 02/02/2017 4:35:12 PM By: Curtis Sites Entered By: Curtis Sites on 02/02/2017 13:36:58 Czerwonka, Chloey ([id number]) -------------------------------------------------------------------------------- Vitals Details Patient Name: [name] Date of Service: 02/02/2017 1:30 PM Medical Record Patient Account Number: [id number] Number: Treating RN: Curtis Sites 05-15-1935 (80 y.o. Other Clinician: Date of Birth/Sex: Female) Treating ROBSON, [name] Primary Care Sabree Nuon: Duncan Dull Sharronda Schweers/Extender: G Referring Emonnie Cannady: Denton Brick in Treatment: 24 Vital Signs Time Taken: 13:37 Temperature (F): 98.1 Height (in): 66 Pulse (bpm): 73 Weight (lbs): 140.8 Respiratory Rate (breaths/min): 18 Body Mass Index (BMI): 22.7 Blood Pressure (mmHg): 127/46 Reference Range: 80 - 120 mg / dl Electronic Signature(s) Signed: 02/02/2017 4:35:12 PM By: Curtis Sites Entered By: Curtis Sites on 02/02/2017 13:38:03

## 2017-02-03 NOTE — Progress Notes (Signed)
[name], Hottel [name] ([id number]) Visit Report for 02/02/2017 Debridement Details Patient Name: [name], [name] Date of Service: 02/02/2017 1:30 PM Medical Record Patient Account Number: [id number] Number: Treating RN: Curtis Sites 1935/05/10 (81 y.o. Other Clinician: Date of Birth/Sex: Female) Treating Zettie Gootee Primary Care Provider: Duncan Dull Provider/Extender: G Referring Provider: Denton Brick in Treatment: 24 Debridement Performed for Wound #2 Left Calcaneus Assessment: Performed By: Physician Maxwell Caul, MD Debridement: Debridement Pre-procedure Verification/Time Out Yes - 13:45 Taken: Start Time: 13:45 Pain Control: Lidocaine 4% Topical Solution Level: Skin/Subcutaneous Tissue Total Area Debrided (L x 0.8 (cm) x 1.2 (cm) = 0.96 (cm) W): Tissue and other Viable, Non-Viable, Callus, Fibrin/Slough, Subcutaneous material debrided: Instrument: Curette Bleeding: Minimum Hemostasis Achieved: Pressure End Time: 13:48 Procedural Pain: 0 Post Procedural Pain: 0 Response to Treatment: Procedure was tolerated well Post Debridement Measurements of Total Wound Length: (cm) 0.9 Stage: Category/Stage III Width: (cm) 1.3 Depth: (cm) 0.2 Volume: (cm) 0.184 Character of Wound/Ulcer Post Improved Debridement: Post Procedure Diagnosis Same as Pre-procedure Electronic Signature(s) Salona, Viona ([id number]) Signed: 02/02/2017 4:12:03 PM By: Baltazar Najjar MD Signed: 02/02/2017 4:35:12 PM By: Curtis Sites Entered By: Baltazar Najjar on 02/02/2017 14:03:16 Rushing, Amit ([id number]) -------------------------------------------------------------------------------- HPI Details Patient Name: [name] Date of Service: 02/02/2017 1:30 PM Medical Record Patient Account Number: [id number] Number: Treating RN: Curtis Sites 1935-05-30 (81 y.o. Other Clinician: Date of Birth/Sex: Female) Treating Tien Aispuro Primary Care  Provider: Duncan Dull Provider/Extender: G Referring Provider: Denton Brick in Treatment: 24 History of Present Illness HPI Description: 08/18/16; this is a 81 year old woman who is a type II diabetic not currently on any treatment. She is also listed as having Alzheimer's disease hypertension. She has a history of chronic venous insufficiency with leg wounds in the past but no history of wounds in her feet. In terms of her diabetes at one point she was on insulin however she is no longer on any current treatment, her daughter who is providing most of the history is unaware of her hemoglobin A1c. She has stage IV chronic renal failure and follows with nephrology. The history provided by her daughter is that she has had a wound on the left heel perhaps dating back to last May/17. At that point she was in the hospital predominantly with psychiatric issues and was [facility]. This was treated with some form of foam dressing and may have actually healed however she was readmitted to hospital in January with Escherichia coli sepsis felt to be secondary to a UTI. Since then she is in [facility] skilled facility. Apparently this timeframe both the left heel and right heel reopened or at least the daughter became aware that they were both open. The exact timeframe isn't really certain Her ABIs in this clinic were 1.08 on the right 1.25 on the left. Looking through [employer] doesn't really provide any useful information. She did have venous ultrasounds in 2016 that did not show a DVT. I don't see a recent hemoglobin A1c 08/25/16; from last week we have been using Santyl to both heels. Apparently the nursing home didn't get an x-ray of both heels [[facility]] and the daughter states that there was "no injury to bone" but for some reason we haven't been able to get the official report from them. 09/01/16; continued improvement in both wounds on her bilateral heels using  Santyl. X-ray report was negative for osteomyelitis 09/08/16; patient from [facility] skilled facility. Using Santyl on both her heel wounds which  are fortunately not on the plantar surface. 09/15/16; patient is from [facility] skilled facility. She has been using Santyl on both her pressure areas which were stage III. Fortunately these or not on the plantar surface. 09/22/16; patient arrives today with a much less viable looking surface on her heels. We had been using Santyl with good improvement however unchanged either fair last week. This may be unrelated however she required debridement today. 09/29/16; patient arrives with a better looking surface on the left heel unfortunately this is a more substantial wound. The area on the right lateral calcaneus again covered in a nonviable necrotic surface. We have been making good progress with Santyl. I think I'm going to need to go back to a debriding agent. 10/06/16; still nonviable surface material over the right greater than the left heel. Apparently the facility where this woman resides did not have Iodoflex so rather than calling the clinic they decided to make some cocktail of material and then settled on Hydrofera Blue. Patient is really generally made pretty good progress versus when she came in her first however still has too much nonviable surface especially on the [facility] [place] ([id number]) right. 10/13/16; she arrives today with Iodoflex on both heels. This was the dressing that we couldn't seem to get last week. Her daughter tells [place] that where they were traveling to [place] last weekend she actually use Santyl. Both wounds are in better condition today and didn't seem to require debridement however I'm not exactly sure what is been most helpful 10/20/16; using Santyl to both heels better condition. Nursing reports green drainage 11/03/16 patient wounds appeared to be doing fairly well on evaluation today she seen along with  her daughter in the office today. She does have slough covering the wound unfortunately no evidence of infection. 11/10/16; both wounds appear to be making nice progress. This is on the bilateral heels 11/24/16; he also continued to be making nice progress. Wounds are smaller. Using Hydrofera Blue. Orders are to change daily in the nursing home although according to her daughter they're actually being changed to her 3 times a week which is probably satisfactory 12/08/16 on evaluation today patient bilateral hills appear to be making improvement. Improvement is somewhat slow but nonetheless week by week we are noting some change. Overall I'm pleased with the progress that we have seen. Nonetheless patient states that she really would like for this to already be completely healed and closed which obviously it is not. She wonders how long it's gonna be before she can get back to normal walking and wearing shoes. No fevers, chills, nausea, or vomiting noted at this time. Secondary to mental status patient is unable to rate or describe her pain. 12/22/16; patient continues to make nice progress. The area on the right lateral calcaneus as improved and dimensions quite a bit since the last time I saw this, the area on the left is more substantial but also seems to be making improvement. No debridement was required in any area 01/05/17; the patient came in with the right lateral calcaneus covered an eschar. After removal there is still a small open area here. The area on the left still required debridement of circumferential senescent edges of tissue and nonviable surface material. We've been using Hydrofera Blue and making progress albeit slowly 01/19/17; patient has skilled facility I've been following for substantial right and left calcaneus pressure ulcers. We've been using Hydrofera Blue. The one on the right appears to be closed today. Left down  in dimensions 02/02/17; the area on the right lateral heel  remains closed. Still be open wound on the left. Dimensions down however not as much as I would like. Using [facility] Electronic Signature(s) Signed: 02/02/2017 4:12:03 PM By: Baltazar Najjar MD Entered By: Baltazar Najjar on 02/02/2017 14:04:24 Charpentier, Kayliee ([id number]) -------------------------------------------------------------------------------- Physical Exam Details Patient Name: [name] Date of Service: 02/02/2017 1:30 PM Medical Record Patient Account Number: [id number] Number: Treating RN: Curtis Sites [birth year] (80 y.o. Other Clinician: Date of Birth/Sex: Female) Treating Kaspar Albornoz Primary Care Provider: Duncan Dull Provider/Extender: G Referring Provider: Denton Brick in Treatment: 24 Constitutional Sitting or standing Blood Pressure is within target range for patient.. Pulse regular and within target range for patient.Marland Kitchen Respirations regular, non-labored and within target range.. Temperature is normal and within the target range for the patient.Marland Kitchen appears in no distress. Notes Wound exam; the area on the lateral right heel remains fully epithelialized and is healed. oOn the left there is still more substantial open wound with raised nonviable edges. Using a #5 curet the skin and subcutaneous tissue around the wound circumference is debrided. Hemostasis with direct pressure. Surface also debrided of some necrotic debris. Electronic Signature(s) Signed: 02/02/2017 4:12:03 PM By: Baltazar Najjar MD Entered By: Baltazar Najjar on 02/02/2017 14:05:28 [place], [place] ([id number]) -------------------------------------------------------------------------------- Physician Orders Details Patient Name: [name] Date of Service: 02/02/2017 1:30 PM Medical Record Patient Account Number: [id number] Number: Treating RN: Curtis Sites [birth year] (80 y.o. Other Clinician: Date of Birth/Sex: Female) Treating Oda Lansdowne,  Rabon Scholle Primary Care Provider: Duncan Dull Provider/Extender: G Referring Provider: Denton Brick in Treatment: 24 Verbal / Phone Orders: No Diagnosis Coding Wound Cleansing Wound #2 Left Calcaneus o Clean wound with Normal Saline. o May Shower, gently pat wound dry prior to applying new dressing. Anesthetic Wound #2 Left Calcaneus o Topical Lidocaine 4% cream applied to wound bed prior to debridement Primary Wound Dressing Wound #2 Left Calcaneus o Hydrafera Blue Secondary Dressing Wound #2 Left Calcaneus o Dry Gauze o Conform/Kerlix o Foam - heel cups o Other - stretch netting #4 (please order for pt...daughter is aware) Dressing Change Frequency Wound #2 Left Calcaneus o Change dressing every day. Follow-up Appointments Wound #2 Left Calcaneus o Return Appointment in 2 weeks. Edema Control Wound #2 Left Calcaneus o Elevate legs to the level of the heart and pump ankles as often as possible Off-Loading Rizzo, Caitlyne ([id number]) Wound #2 Left Calcaneus o Turn and reposition every 2 hours o Other: - wear heel protector boots at al times in bed, float heels while in bed, Additional Orders / Instructions Wound #2 Left Calcaneus o Increase protein intake. o Other: - please add vitamin A, vitamin C and zinc supplements to your diet Electronic Signature(s) Signed: 02/02/2017 4:12:03 PM By: Baltazar Najjar MD Signed: 02/02/2017 4:35:12 PM By: Curtis Sites Entered By: Curtis Sites on 02/02/2017 13:47:55 Kendrix, Millie ([id number]) -------------------------------------------------------------------------------- Problem List Details Patient Name: [name] Date of Service: 02/02/2017 1:30 PM Medical Record Patient Account Number: [id number] Number: Treating RN: Curtis Sites [birth year] (80 y.o. Other Clinician: Date of Birth/Sex: Female) Treating Jacoya Bauman Primary Care Provider: Duncan Dull Provider/Extender: G Referring Provider: Denton Brick in Treatment: 24 Active Problems ICD-10 Encounter Code Description Active Date Diagnosis E11.621 Type 2 diabetes mellitus with foot ulcer 08/18/2016 Yes L89.623 Pressure ulcer of left heel, stage 3 08/18/2016 Yes L89.613 Pressure ulcer of right heel, stage 3 08/18/2016 Yes Inactive Problems Resolved Problems Electronic Signature(s) Signed: 02/02/2017 4:12:03 PM By:  Baltazar Najjar MD Entered By: Baltazar Najjar on 02/02/2017 14:02:47 Chevalier, Neli ([id number]) -------------------------------------------------------------------------------- Progress Note Details Patient Name: [name] Date of Service: 02/02/2017 1:30 PM Medical Record Patient Account Number: [id number] Number: Treating RN: Curtis Sites Aug 27, 1935 (80 y.o. Other Clinician: Date of Birth/Sex: Female) Treating Dehlia Kilner Primary Care Provider: Duncan Dull Provider/Extender: G Referring Provider: Denton Brick in Treatment: 24 Subjective History of Present Illness (HPI) 08/18/16; this is a 81 year old woman who is a type II diabetic not currently on any treatment. She is also listed as having Alzheimer's disease hypertension. She has a history of chronic venous insufficiency with leg wounds in the past but no history of wounds in her feet. In terms of her diabetes at one point she was on insulin however she is no longer on any current treatment, her daughter who is providing most of the history is unaware of her hemoglobin A1c. She has stage IV chronic renal failure and follows with nephrology. The history provided by her daughter is that she has had a wound on the left heel perhaps dating back to last May/17. At that point she was in the hospital predominantly with psychiatric issues and was discharged [facility]. This was treated with some form of foam dressing and may have actually healed however she was readmitted  to hospital in January with Escherichia coli sepsis felt to be secondary to a UTI. Since then she is in [facility] skilled facility. Apparently this timeframe both the left heel and right heel reopened or at least the daughter became aware that they were both open. The exact timeframe isn't really certain Her ABIs in this clinic were 1.08 on the right 1.25 on the left. Looking through [employer] doesn't really provide any useful information. She did have venous ultrasounds in 2016 that did not show a DVT. I don't see a recent hemoglobin A1c 08/25/16; from last week we have been using Santyl to both heels. Apparently the nursing home didn't get an x-ray of both heels [[facility]] and the daughter states that there was "no injury to bone" but for some reason we haven't been able to get the official report from them. 09/01/16; continued improvement in both wounds on her bilateral heels using Santyl. X-ray report was negative for osteomyelitis 09/08/16; patient from [facility] skilled facility. Using Santyl on both her heel wounds which are fortunately not on the plantar surface. 09/15/16; patient is from [facility] skilled facility. She has been using Santyl on both her pressure areas which were stage III. Fortunately these or not on the plantar surface. 09/22/16; patient arrives today with a much less viable looking surface on her heels. We had been using Santyl with good improvement however unchanged either fair last week. This may be unrelated however she required debridement today. 09/29/16; patient arrives with a better looking surface on the left heel unfortunately this is a more substantial wound. The area on the right lateral calcaneus again covered in a nonviable necrotic surface. We have been making good progress with Santyl. I think I'm going to need to go back to a debriding agent. 10/06/16; still nonviable surface material over the right greater than the left heel.  Apparently the facility where this woman resides did not have Iodoflex so rather than calling the clinic they decided to make some cocktail of material and then settled on Hydrofera Blue. Patient is really generally made pretty good progress versus when she came in her first however still has too  much nonviable surface especially on the [place], Aamari ([id number]) right. 10/13/16; she arrives today with Iodoflex on both heels. This was the dressing that we couldn't seem to get last week. Her daughter tells [place] that where they were traveling to [place] last weekend she actually use Santyl. Both wounds are in better condition today and didn't seem to require debridement however I'm not exactly sure what is been most helpful 10/20/16; using Santyl to both heels better condition. Nursing reports green drainage 11/03/16 patient wounds appeared to be doing fairly well on evaluation today she seen along with her daughter in the office today. She does have slough covering the wound unfortunately no evidence of infection. 11/10/16; both wounds appear to be making nice progress. This is on the bilateral heels 11/24/16; he also continued to be making nice progress. Wounds are smaller. Using Hydrofera Blue. Orders are to change daily in the nursing home although according to her daughter they're actually being changed to her 3 times a week which is probably satisfactory 12/08/16 on evaluation today patient bilateral hills appear to be making improvement. Improvement is somewhat slow but nonetheless week by week we are noting some change. Overall I'm pleased with the progress that we have seen. Nonetheless patient states that she really would like for this to already be completely healed and closed which obviously it is not. She wonders how long it's gonna be before she can get back to normal walking and wearing shoes. No fevers, chills, nausea, or vomiting noted at this time. Secondary to mental status patient is  unable to rate or describe her pain. 12/22/16; patient continues to make nice progress. The area on the right lateral calcaneus as improved and dimensions quite a bit since the last time I saw this, the area on the left is more substantial but also seems to be making improvement. No debridement was required in any area 01/05/17; the patient came in with the right lateral calcaneus covered an eschar. After removal there is still a small open area here. The area on the left still required debridement of circumferential senescent edges of tissue and nonviable surface material. We've been using Hydrofera Blue and making progress albeit slowly 01/19/17; patient has skilled facility I've been following for substantial right and left calcaneus pressure ulcers. We've been using Hydrofera Blue. The one on the right appears to be closed today. Left down in dimensions 02/02/17; the area on the right lateral heel remains closed. Still be open wound on the left. Dimensions down however not as much as I would like. Using Hydrofera Blue Objective Constitutional Sitting or standing Blood Pressure is within target range for patient.. Pulse regular and within target range for patient.Marland Kitchen Respirations regular, non-labored and within target range.. Temperature is normal and within the target range for the patient.Marland Kitchen appears in no distress. Vitals Time Taken: 1:37 PM, Height: 66 in, Weight: 140.8 lbs, BMI: 22.7, Temperature: 98.1 F, Pulse: 73 bpm, Respiratory Rate: 18 breaths/min, Blood Pressure: 127/46 mmHg. Renovato, Ashli ([id number]) General Notes: Wound exam; the area on the lateral right heel remains fully epithelialized and is healed. On the left there is still more substantial open wound with raised nonviable edges. Using a #5 curet the skin and subcutaneous tissue around the wound circumference is debrided. Hemostasis with direct pressure. Surface also debrided of some necrotic debris. Integumentary (Hair,  Skin) Wound #1 status is Healed - Epithelialized. Original cause of wound was Pressure Injury. The wound is located on the Right Calcaneus. The wound measures  0cm length x 0cm width x 0cm depth; 0cm^2 area and 0cm^3 volume. Wound #2 status is Open. Original cause of wound was Pressure Injury. The wound is located on the Left Calcaneus. The wound measures 0.8cm length x 1.2cm width x 0.1cm depth; 0.754cm^2 area and 0.075cm^3 volume. The wound is limited to skin breakdown. There is no tunneling or undermining noted. There is a large amount of serous drainage noted. Foul odor after cleansing was noted. The wound margin is flat and intact. There is large (67-100%) pink granulation within the wound bed. There is a small (1-33%) amount of necrotic tissue within the wound bed including Eschar and Adherent Slough. The periwound skin appearance did not exhibit: Callus, Crepitus, Excoriation, Induration, Rash, Scarring, Dry/Scaly, Maceration, Atrophie Blanche, Cyanosis, Ecchymosis, Hemosiderin Staining, Mottled, Pallor, Rubor, Erythema. Periwound temperature was noted as No Abnormality. The periwound has tenderness on palpation. Assessment Active Problems ICD-10 E11.621 - Type 2 diabetes mellitus with foot ulcer L89.623 - Pressure ulcer of left heel, stage 3 L89.613 - Pressure ulcer of right heel, stage 3 Procedures Wound #2 Pre-procedure diagnosis of Wound #2 is a Pressure Ulcer located on the Left Calcaneus . There was a Skin/Subcutaneous Tissue Debridement ([id number]) debridement with total area of 0.96 sq cm performed by Maxwell Caul, MD. with the following instrument(s): Curette to remove Viable and Non-Viable tissue/material including Fibrin/Slough, Callus, and Subcutaneous after achieving pain control using Lidocaine 4% Topical Solution. A time out was conducted at 13:45, prior to the start of the procedure. A Minimum amount of bleeding was controlled with Pressure. The procedure was  tolerated well with a pain level of 0 throughout and a pain level of 0 following the procedure. Post Debridement Measurements: 0.9cm length x 1.3cm width x 0.2cm depth; 0.184cm^3 volume. Post debridement Stage noted as Category/Stage III. Ovando, Joniya ([id number]) Character of Wound/Ulcer Post Debridement is improved. Post procedure Diagnosis Wound #2: Same as Pre-Procedure Plan Wound Cleansing: Wound #2 Left Calcaneus: Clean wound with Normal Saline. May Shower, gently pat wound dry prior to applying new dressing. Anesthetic: Wound #2 Left Calcaneus: Topical Lidocaine 4% cream applied to wound bed prior to debridement Primary Wound Dressing: Wound #2 Left Calcaneus: Hydrafera Blue Secondary Dressing: Wound #2 Left Calcaneus: Dry Gauze Conform/Kerlix Foam - heel cups Other - stretch netting #4 (please order for pt...daughter is aware) Dressing Change Frequency: Wound #2 Left Calcaneus: Change dressing every day. Follow-up Appointments: Wound #2 Left Calcaneus: Return Appointment in 2 weeks. Edema Control: Wound #2 Left Calcaneus: Elevate legs to the level of the heart and pump ankles as often as possible Off-Loading: Wound #2 Left Calcaneus: Turn and reposition every 2 hours Other: - wear heel protector boots at al times in bed, float heels while in bed, Additional Orders / Instructions: Wound #2 Left Calcaneus: Increase protein intake. Other: - please add vitamin A, vitamin C and zinc supplements to your diet Hazelett, Caitlynne ([id number]) o #1 post debridement we used/continued to use Hydrofera Blue to the heel wound. They'll be changing this at the facility. #2 the right heel only needs to be offloaded now Electronic Signature(s) Signed: 02/02/2017 4:12:03 PM By: Baltazar Najjar MD Entered By: Baltazar Najjar on 02/02/2017 14:07:16 Sonntag, Kearstin ([id number]) -------------------------------------------------------------------------------- SuperBill Details Patient  Name: [name] Date of Service: 02/02/2017 Medical Record Patient Account Number: [id number] Number: Treating RN: Curtis Sites Sep 02, 1935 (80 y.o. Other Clinician: Date of Birth/Sex: Female) Treating Emi Lymon Primary Care Provider: Duncan Dull Provider/Extender: G Referring Provider: Denton Brick in Treatment:  24 Diagnosis Coding ICD-10 Codes Code Description E11.621 Type 2 diabetes mellitus with foot ulcer L89.623 Pressure ulcer of left heel, stage 3 L89.613 Pressure ulcer of right heel, stage 3 Facility Procedures CPT4 Code: [id number] Description: 11042 - DEB SUBQ TISSUE 20 SQ CM/< ICD-10 Description Diagnosis L89.623 Pressure ulcer of left heel, stage 3 Modifier: Quantity: 1 Physician Procedures CPT4 Code: [id number] Description: 11042 - WC PHYS SUBQ TISS 20 SQ CM ICD-10 Description Diagnosis L89.623 Pressure ulcer of left heel, stage 3 Modifier: Quantity: 1 Electronic Signature(s) Signed: 02/02/2017 4:12:03 PM By: Baltazar Najjar MD Entered By: Baltazar Najjar on 02/02/2017 14:07:36

## 2017-02-10 ENCOUNTER — Ambulatory Visit: Payer: Medicare (Managed Care) | Admitting: Anesthesiology

## 2017-02-10 ENCOUNTER — Encounter
Admission: RE | Disposition: A | Discharge status: Home / Self Care | Payer: Self-pay | Source: Ambulatory Visit | Attending: Ophthalmology

## 2017-02-10 ENCOUNTER — Encounter: Payer: Self-pay | Admitting: *Deleted

## 2017-02-10 ENCOUNTER — Ambulatory Visit
Admission: RE | Admit: 2017-02-10 | Discharge: 2017-02-10 | Disposition: A | Discharge status: Home / Self Care | Payer: Medicare (Managed Care) | Source: Ambulatory Visit | Attending: Ophthalmology | Admitting: Ophthalmology

## 2017-02-10 DIAGNOSIS — M1990 Unspecified osteoarthritis, unspecified site: Secondary | ICD-10-CM | POA: Diagnosis not present

## 2017-02-10 DIAGNOSIS — E039 Hypothyroidism, unspecified: Secondary | ICD-10-CM | POA: Diagnosis not present

## 2017-02-10 DIAGNOSIS — H2512 Age-related nuclear cataract, left eye: Secondary | ICD-10-CM | POA: Insufficient documentation

## 2017-02-10 DIAGNOSIS — Z87891 Personal history of nicotine dependence: Secondary | ICD-10-CM | POA: Insufficient documentation

## 2017-02-10 DIAGNOSIS — E119 Type 2 diabetes mellitus without complications: Secondary | ICD-10-CM | POA: Insufficient documentation

## 2017-02-10 DIAGNOSIS — Z7984 Long term (current) use of oral hypoglycemic drugs: Secondary | ICD-10-CM | POA: Diagnosis not present

## 2017-02-10 DIAGNOSIS — K219 Gastro-esophageal reflux disease without esophagitis: Secondary | ICD-10-CM | POA: Insufficient documentation

## 2017-02-10 DIAGNOSIS — G20 Parkinson's disease: Secondary | ICD-10-CM | POA: Diagnosis not present

## 2017-02-10 HISTORY — PX: CATARACT EXTRACTION W/PHACO: SHX586

## 2017-02-10 HISTORY — DX: Polyneuropathy, unspecified: G62.9

## 2017-02-10 HISTORY — DX: Phlebitis and thrombophlebitis of unspecified site: I80.9

## 2017-02-10 HISTORY — DX: Parkinson's disease: G20

## 2017-02-10 HISTORY — DX: Cardiac murmur, unspecified: R01.1

## 2017-02-10 HISTORY — DX: Non-pressure chronic ulcer of other part of unspecified foot with unspecified severity: L97.509

## 2017-02-10 HISTORY — DX: Cardiac arrhythmia, unspecified: I49.9

## 2017-02-10 HISTORY — DX: Hypothyroidism, unspecified: E03.9

## 2017-02-10 HISTORY — DX: Personal history of other diseases of the digestive system: Z87.19

## 2017-02-10 HISTORY — DX: Peripheral vascular disease, unspecified: I73.9

## 2017-02-10 HISTORY — DX: Parkinson's disease without dyskinesia, without mention of fluctuations: G20.A1

## 2017-02-10 LAB — GLUCOSE, CAPILLARY: Glucose-Capillary: 82 mg/dL (ref 65–99)

## 2017-02-10 SURGERY — PHACOEMULSIFICATION, CATARACT, WITH IOL INSERTION
Anesthesia: Monitor Anesthesia Care | Site: Eye | Laterality: Left | Wound class: Clean

## 2017-02-10 MED ORDER — BSS IO SOLN
INTRAOCULAR | Status: DC | PRN
  Administered 2017-02-10: 200 mL via INTRAOCULAR

## 2017-02-10 MED ORDER — FENTANYL CITRATE (PF) 100 MCG/2ML IJ SOLN
INTRAMUSCULAR | Status: AC
  Filled 2017-02-10: qty 2

## 2017-02-10 MED ORDER — EPINEPHRINE PF 1 MG/ML IJ SOLN
INTRAMUSCULAR | Status: AC
  Filled 2017-02-10: qty 1

## 2017-02-10 MED ORDER — SEVOFLURANE IN SOLN
RESPIRATORY_TRACT | Status: AC
  Filled 2017-02-10: qty 250

## 2017-02-10 MED ORDER — MOXIFLOXACIN HCL 0.5 % OP SOLN
1.0000 [drp] | OPHTHALMIC | Status: DC | PRN
  Administered 2017-02-10: 0.2 mL via OPHTHALMIC
  Filled 2017-02-10: qty 3

## 2017-02-10 MED ORDER — NA CHONDROIT SULF-NA HYALURON 40-30 MG/ML IO SOLN
INTRAOCULAR | Status: DC | PRN
  Administered 2017-02-10: 1 mL via INTRAOCULAR

## 2017-02-10 MED ORDER — ONDANSETRON HCL 4 MG/2ML IJ SOLN
INTRAMUSCULAR | Status: DC | PRN
  Administered 2017-02-10: 4 mg via INTRAVENOUS

## 2017-02-10 MED ORDER — POVIDONE-IODINE 5 % OP SOLN
OPHTHALMIC | Status: DC | PRN
  Administered 2017-02-10: 1 via OPHTHALMIC

## 2017-02-10 MED ORDER — LIDOCAINE HCL (PF) 4 % IJ SOLN
INTRAMUSCULAR | Status: AC
  Filled 2017-02-10: qty 5

## 2017-02-10 MED ORDER — LIDOCAINE HCL (PF) 4 % IJ SOLN
INTRAOCULAR | Status: DC | PRN
  Administered 2017-02-10: 4 mL via OPHTHALMIC

## 2017-02-10 MED ORDER — NA CHONDROIT SULF-NA HYALURON 40-17 MG/ML IO SOLN
INTRAOCULAR | Status: AC
  Filled 2017-02-10: qty 1

## 2017-02-10 MED ORDER — MOXIFLOXACIN HCL 0.5 % OP SOLN
OPHTHALMIC | Status: AC
  Filled 2017-02-10: qty 3

## 2017-02-10 MED ORDER — POVIDONE-IODINE 5 % OP SOLN
OPHTHALMIC | Status: AC
  Filled 2017-02-10: qty 30

## 2017-02-10 MED ORDER — MIDAZOLAM HCL 2 MG/2ML IJ SOLN
INTRAMUSCULAR | Status: AC
  Filled 2017-02-10: qty 2

## 2017-02-10 MED ORDER — MIDAZOLAM HCL 2 MG/2ML IJ SOLN
INTRAMUSCULAR | Status: DC | PRN
  Administered 2017-02-10: 1 mg via INTRAVENOUS

## 2017-02-10 MED ORDER — SODIUM CHLORIDE 0.9 % IV SOLN
INTRAVENOUS | Status: DC
  Administered 2017-02-10: 06:00:00 via INTRAVENOUS

## 2017-02-10 MED ORDER — PROPOFOL 10 MG/ML IV BOLUS
INTRAVENOUS | Status: AC
  Filled 2017-02-10: qty 20

## 2017-02-10 MED ORDER — ARMC OPHTHALMIC DILATING DROPS
OPHTHALMIC | Status: AC
  Administered 2017-02-10: 1 via OPHTHALMIC
  Filled 2017-02-10: qty 0.4

## 2017-02-10 MED ORDER — ARMC OPHTHALMIC DILATING DROPS
1.0000 "application " | OPHTHALMIC | Status: AC | PRN
  Administered 2017-02-10 (×3): 1 via OPHTHALMIC

## 2017-02-10 MED ORDER — FENTANYL CITRATE (PF) 100 MCG/2ML IJ SOLN
INTRAMUSCULAR | Status: DC | PRN
  Administered 2017-02-10: 25 ug via INTRAVENOUS

## 2017-02-10 SURGICAL SUPPLY — 16 items
DISSECTOR HYDRO NUCLEUS 50X22 (MISCELLANEOUS) ×3 IMPLANT
GLOVE BIO SURGEON STRL SZ8 (GLOVE) ×3 IMPLANT
GLOVE BIOGEL M 6.5 STRL (GLOVE) ×3 IMPLANT
GLOVE SURG LX 7.5 STRW (GLOVE) ×2
GLOVE SURG LX STRL 7.5 STRW (GLOVE) ×1 IMPLANT
GOWN STRL REUS W/ TWL LRG LVL3 (GOWN DISPOSABLE) ×2 IMPLANT
GOWN STRL REUS W/TWL LRG LVL3 (GOWN DISPOSABLE) ×4
LABEL CATARACT MEDS ST (LABEL) ×3 IMPLANT
LENS IOL TECNIS ITEC 25.5 (Intraocular Lens) ×3 IMPLANT
PACK CATARACT (MISCELLANEOUS) ×3 IMPLANT
PACK CATARACT KING (MISCELLANEOUS) ×3 IMPLANT
PACK EYE AFTER SURG (MISCELLANEOUS) ×3 IMPLANT
SOL BSS BAG (MISCELLANEOUS) ×3
SOLUTION BSS BAG (MISCELLANEOUS) ×1 IMPLANT
WATER STERILE IRR 250ML POUR (IV SOLUTION) ×3 IMPLANT
WIPE NON LINTING 3.25X3.25 (MISCELLANEOUS) ×3 IMPLANT

## 2017-02-10 NOTE — Op Note (Signed)
OPERATIVE NOTE  [name] [id number] 02/10/2017   PREOPERATIVE DIAGNOSIS:  Nuclear sclerotic cataract left eye.  H25.12   POSTOPERATIVE DIAGNOSIS:    Nuclear sclerotic cataract left eye.     PROCEDURE:  Phacoemusification with posterior chamber intraocular lens placement of the left eye   LENS:   Implant Name Type Inv. Item Serial No. Manufacturer Lot No. LRB No. Used  LENS IOL DIOP 25.5 - [id number] Intraocular Lens LENS IOL DIOP 25.5 [id number] AMO   Left 1       PCB00 +25.5   ULTRASOUND TIME: 0 minutes 44.6 seconds.  CDE 4.18   SURGEON:  Willey BladeBradley King, MD, MPH   ANESTHESIA:  Topical with tetracaine drops augmented with 1% preservative-free intracameral lidocaine.  ESTIMATED BLOOD LOSS: <1 mL   COMPLICATIONS:  None.   DESCRIPTION OF PROCEDURE:  The patient was identified in the holding room and transported to the operating room and placed in the supine position under the operating microscope.  The left eye was identified as the operative eye and it was prepped and draped in the usual sterile ophthalmic fashion.   A 1.0 millimeter clear-corneal paracentesis was made at the 5:00 position. 0.5 ml of preservative-free 1% lidocaine with epinephrine was injected into the anterior chamber.  The anterior chamber was filled with Discovisc viscoelastic.  A 2.4 millimeter keratome was used to make a near-clear corneal incision at the 2:00 position.  A curvilinear capsulorrhexis was made with a cystotome and capsulorrhexis forceps.  Balanced salt solution was used to hydrodissect and hydrodelineate the nucleus.   Phacoemulsification was then used in stop and chop fashion to remove the lens nucleus and epinucleus.  The remaining cortex was then removed using the irrigation and aspiration handpiece. Discovisc was then placed into the capsular bag to distend it for lens placement.  A lens was then injected into the capsular bag.  The remaining viscoelastic was aspirated.   Wounds were  hydrated with balanced salt solution.  The anterior chamber was inflated to a physiologic pressure with balanced salt solution.  Intracameral vigamox 0.1 mL undiltued was injected into the eye and a drop placed onto the ocular surface.  No wound leaks were noted.  The patient was taken to the recovery room in stable condition without complications of anesthesia or surgery  Willey BladeBradley King 02/10/2017, 8:00 AM

## 2017-02-10 NOTE — Transfer of Care (Signed)
Immediate Anesthesia Transfer of Care Note  Patient: [name]  Procedure(s) Performed: CATARACT EXTRACTION PHACO AND INTRAOCULAR LENS PLACEMENT (IOC)-LEFT DIABETIC (Left Eye)  Patient Location: PACU  Anesthesia Type:MAC  Level of Consciousness: awake, alert  and oriented  Airway & Oxygen Therapy: Patient Spontanous Breathing  Post-op Assessment: Report given to RN and Post -op Vital signs reviewed and stable  Post vital signs: Reviewed and stable  Last Vitals:  Vitals:   02/10/17 0607  BP: (!) 160/52  Pulse: 64  Resp: 18  Temp: 36.6 C  SpO2: 100%    Last Pain:  Vitals:   02/10/17 0607  TempSrc: Tympanic         Complications: No apparent anesthesia complications

## 2017-02-10 NOTE — Anesthesia Preprocedure Evaluation (Signed)
Anesthesia Evaluation  Patient identified by MRN, date of birth, ID band Patient awake    Reviewed: Allergy & Precautions, NPO status , Patient's Chart, lab work & pertinent test results  History of Anesthesia Complications Negative for: history of anesthetic complications  Airway Mallampati: III  TM Distance: >3 FB Neck ROM: Full    Dental  (+) Upper Dentures   Pulmonary neg sleep apnea, neg COPD, former smoker,    breath sounds clear to auscultation- rhonchi (-) wheezing      Cardiovascular hypertension, +CHF (preserved EF)  (-) CAD, (-) Past MI and (-) Cardiac Stents  Rhythm:Regular Rate:Normal - Systolic murmurs and - Diastolic murmurs Echo 3/71/06: - Left ventricle: There was moderate concentric hypertrophy.   Systolic function was vigorous. The estimated ejection fraction   was in the range of 65% to 70%. - Aortic valve: There was moderate regurgitation. - Mitral valve: There was mild regurgitation.   Neuro/Psych PSYCHIATRIC DISORDERS Anxiety Bipolar Disorder Schizophrenia Parkinson's disease     GI/Hepatic Neg liver ROS, hiatal hernia, GERD  ,  Endo/Other  diabetes, Oral Hypoglycemic AgentsHypothyroidism   Renal/GU Renal InsufficiencyRenal disease     Musculoskeletal  (+) Arthritis ,   Abdominal (+) - obese,   Peds  Hematology  (+) anemia ,   Anesthesia Other Findings Past Medical History: No date: Anemia No date: Arthritis     Comment:  osteo No date: CHF (congestive heart failure) (HCC) No date: Diabetes mellitus No date: Dysrhythmia No date: GERD (gastroesophageal reflux disease) No date: Heart murmur No date: Hemorrhoids No date: History of hiatal hernia No date: Hypertension No date: Hypertension No date: Hypothyroidism No date: Neuropathy No date: Parkinson disease ([place]) No date: Peripheral vascular disease (HCC) No date: Phlebitis     Comment:  history of in legs No date: Renal  disorder No date: Ulcer of foot ([place])     Comment:  left foot   Reproductive/Obstetrics                             Anesthesia Physical Anesthesia Plan  ASA: III  Anesthesia Plan: MAC   Post-op Pain Management:    Induction: Intravenous  PONV Risk Score and Plan: 2 and Midazolam  Airway Management Planned: Natural Airway  Additional Equipment:   Intra-op Plan:   Post-operative Plan:   Informed Consent: I have reviewed the patients History and Physical, chart, labs and discussed the procedure including the risks, benefits and alternatives for the proposed anesthesia with the patient or authorized representative who has indicated his/her understanding and acceptance.     Plan Discussed with: CRNA and Anesthesiologist  Anesthesia Plan Comments:         Anesthesia Quick Evaluation

## 2017-02-10 NOTE — Anesthesia Post-op Follow-up Note (Signed)
Anesthesia QCDR form completed.        

## 2017-02-10 NOTE — Anesthesia Postprocedure Evaluation (Signed)
Anesthesia Post Note  Patient: [name]  Procedure(s) Performed: CATARACT EXTRACTION PHACO AND INTRAOCULAR LENS PLACEMENT (IOC)-LEFT DIABETIC (Left Eye)  Patient location during evaluation: PACU Anesthesia Type: MAC Level of consciousness: awake and alert Pain management: pain level controlled Vital Signs Assessment: post-procedure vital signs reviewed and stable Respiratory status: spontaneous breathing, nonlabored ventilation, respiratory function stable and patient connected to nasal cannula oxygen Cardiovascular status: stable and blood pressure returned to baseline Postop Assessment: no apparent nausea or vomiting Anesthetic complications: no     Last Vitals:  Vitals:   02/10/17 0804 02/10/17 0809  BP: (!) 114/39 (!) 114/39  Pulse:  62  Resp:    Temp:    SpO2:  100%    Last Pain:  Vitals:   02/10/17 0607  TempSrc: Tympanic                 Lenwood Balsam

## 2017-02-10 NOTE — H&P (Signed)
The History and Physical notes are on paper, have been signed, and are to be scanned.   I have examined the patient and there are no changes to the H&P.   Willey BladeBradley Deyona Soza 02/10/2017 7:16 AM

## 2017-02-10 NOTE — Discharge Instructions (Signed)
Eye Surgery Discharge Instructions  Expect mild scratchy sensation or mild soreness. DO NOT RUB YOUR EYE!  The day of surgery:  Minimal physical activity, but bed rest is not required  No reading, computer work, or close hand work  No bending, lifting, or straining.  May watch TV  For 24 hours:  No driving, legal decisions, or alcoholic beverages  Safety precautions  Eat anything you prefer: It is better to start with liquids, then soup then solid foods.  _____ Eye patch should be worn until postoperative exam tomorrow.  ____ Solar shield eyeglasses should be worn for comfort in the sunlight/patch while sleeping  Resume all regular medications including aspirin or Coumadin if these were discontinued prior to surgery. You may shower, bathe, shave, or wash your hair. Tylenol may be taken for mild discomfort.  Call your doctor if you experience significant pain, nausea, or vomiting, fever > 101 or other signs of infection. [phone] or [phone] Specific instructions:  Follow-up Information    [place] CraneKing, Bradley Mark, MD Follow up.   Specialty:  Ophthalmology Why:  October 19 at 10:20am Contact information: [address] [place] [id number] [phone]

## 2017-02-16 ENCOUNTER — Encounter: Payer: Medicare (Managed Care) | Admitting: Internal Medicine

## 2017-02-16 DIAGNOSIS — E11621 Type 2 diabetes mellitus with foot ulcer: Secondary | ICD-10-CM | POA: Diagnosis not present

## 2017-02-18 NOTE — Progress Notes (Signed)
[name], [name] Visit Report for 02/16/2017 Debridement Details Patient Name: [name], [name] Date of Service: 02/16/2017 9:00 AM Medical Record Number: [id number] Patient Account Number: [id number] Date of Birth/Sex: [birth year] (80 y.o. Female) Treating RN: Primary Care Provider: Duncan Dull Other Clinician: Referring Provider: Duncan Dull Treating Provider/Extender: Altamese [place] in Treatment: 26 Debridement Performed for Wound #2 Left Calcaneus Assessment: Performed By: Physician Maxwell Caul, MD Debridement: Debridement Pre-procedure Verification/Time Yes - 09:32 Out Taken: Start Time: 09:33 Pain Control: Lidocaine 4% Topical Solution Level: Skin/Subcutaneous Tissue Total Area Debrided (L x W): 0.7 (cm) x 1.2 (cm) = 0.84 (cm) Tissue and other material Viable, Non-Viable, Exudate, Fibrin/Slough, Subcutaneous debrided: Instrument: Curette Bleeding: Minimum Hemostasis Achieved: Pressure End Time: 09:35 Procedural Pain: 0 Post Procedural Pain: 0 Response to Treatment: Procedure was tolerated well Post Debridement Measurements of Total Wound Length: (cm) 0.7 Stage: Category/Stage III Width: (cm) 1.2 Depth: (cm) 0.2 Volume: (cm) 0.132 Character of Wound/Ulcer Post Requires Further Debridement Debridement: Post Procedure Diagnosis Same as Pre-procedure Electronic Signature(s) Signed: 02/16/2017 5:28:55 PM By: Baltazar Najjar MD Entered By: Baltazar Najjar on 02/16/2017 10:11:59 Sanderfer, Salinda ([id number]) -------------------------------------------------------------------------------- HPI Details Patient Name: [name] Date of Service: 02/16/2017 9:00 AM Medical Record Number: [id number] Patient Account Number: [id number] Date of Birth/Sex: Oct 25, 1935 (80 y.o. Female) Treating RN: Primary Care Provider: Duncan Dull Other Clinician: Referring Provider: Duncan Dull Treating Provider/Extender: Altamese [place] in  Treatment: 26 History of Present Illness HPI Description: 08/18/16; this is a 81 year old woman who is a type II diabetic not currently on any treatment. She is also listed as having Alzheimer's disease hypertension. She has a history of chronic venous insufficiency with leg wounds in the past but no history of wounds in her feet. In terms of her diabetes at one point she was on insulin however she is no longer on any current treatment, her daughter who is providing most of the history is unaware of her hemoglobin A1c. She has stage IV chronic renal failure and follows with nephrology. The history provided by her daughter is that she has had a wound on the left heel perhaps dating back to last May/17. At that point she was in the hospital predominantly with psychiatric issues and [facility]. This was treated with some form of foam dressing and may have actually healed however she was readmitted to hospital in January with Escherichia coli sepsis felt to be secondary to a UTI. Since then she is in [facility] skilled facility. Apparently this timeframe both the left heel and right heel reopened or at least the daughter became aware that they were both open. The exact timeframe isn't really certain Her ABIs in this clinic were 1.08 on the right 1.25 on the left. Looking through [employer] doesn't really provide any useful information. She did have venous ultrasounds in 2016 that did not show a DVT. I don't see a recent hemoglobin A1c 08/25/16; from last week we have been using Santyl to both heels. Apparently the nursing home didn't get an x-ray of both heels [[facility]] and the daughter states that there was "no injury to bone" but for some reason we haven't been able to get the official report from them. 09/01/16; continued improvement in both wounds on her bilateral heels using Santyl. X-ray report was negative for osteomyelitis 09/08/16; patient from [facility]  skilled facility. Using Santyl on both her heel wounds which are fortunately not on the plantar surface. 09/15/16; patient is from [facility]. She has been using Santyl on both her pressure areas which were stage III. Fortunately these or not on the plantar surface. 09/22/16; patient arrives today with a much less viable looking surface on her heels. We had been using Santyl with good improvement however unchanged either fair last week. This may be unrelated however she required debridement today. 09/29/16; patient arrives with a better looking surface on the left heel unfortunately this is a more substantial wound. The area on the right lateral calcaneus again covered in a nonviable necrotic surface. We have been making good progress with Santyl. I think I'm going to need to go back to a debriding agent. 10/06/16; still nonviable surface material over the right greater than the left heel. Apparently the facility where this woman resides did not have Iodoflex so rather than calling the clinic they decided to make some cocktail of material and then settled on Hydrofera Blue. Patient is really generally made pretty good progress versus when she came in her first however still has too much nonviable surface especially on the right. 10/13/16; she arrives today with Iodoflex on both heels. This was the dressing that we couldn't seem to get last week. Her daughter tells [place] that where they were traveling to [place] last weekend she actually use Santyl. Both wounds are in better condition today and didn't seem to require debridement however I'm not exactly sure what is been most helpful 10/20/16; using Santyl to both heels better condition. Nursing reports green drainage 11/03/16 patient wounds appeared to be doing fairly well on evaluation today she seen along with her daughter in the office today. She does have slough covering the wound unfortunately no evidence of infection. 11/10/16; both  wounds appear to be making nice progress. This is on the bilateral heels 11/24/16; he also continued to be making nice progress. Wounds are smaller. Using Hydrofera Blue. Orders are to change daily in the nursing home although according to her daughter they're actually being changed to her 3 times a week which is probably satisfactory 12/08/16 on evaluation today patient bilateral hills appear to be making improvement. Improvement is somewhat slow but nonetheless week by week we are noting some change. Overall I'm pleased with the progress that we have seen. Nonetheless patient states that she really would like for this to already be completely healed and closed which obviously it is not. She wonders how long it's gonna be before she can get back to normal walking and wearing shoes. No fevers, chills, nausea, or vomiting noted at this time. Secondary to mental status patient is unable to rate or describe her pain. [name], [name] 12/22/16; patient continues to make nice progress. The area on the right lateral calcaneus as improved and dimensions quite a bit since the last time I saw this, the area on the left is more substantial but also seems to be making improvement. No debridement was required in any area 01/05/17; the patient came in with the right lateral calcaneus covered an eschar. After removal there is still a small open area here. The area on the left still required debridement of circumferential senescent edges of tissue and nonviable surface material. We've been using Hydrofera Blue and making progress albeit slowly 01/19/17; patient has skilled facility I've been following for substantial right and left calcaneus pressure ulcers. We've been using Hydrofera Blue. The one on the right appears to be closed today. Left down in dimensions 02/02/17; the area on the right lateral heel remains closed.  Still be open wound on the left. Dimensions down however not as much as I would like.  Using Hydrofera Blue 02/16/17; 2 week follow-up for wound remaining on the right lateral heel. We have been using Hydrofera Blue. The patient states she feels well and has no complaints Electronic Signature(s) Signed: 02/16/2017 5:28:55 PM By: Baltazar Najjar MD Entered By: Baltazar Najjar on 02/16/2017 10:12:39 [name], [name] ([id number]) -------------------------------------------------------------------------------- Physical Exam Details Patient Name: [name] Date of Service: 02/16/2017 9:00 AM Medical Record Number: [id number] Patient Account Number: [id number] Date of Birth/Sex: [birth year] (80 y.o. Female) Treating RN: Primary Care Provider: Duncan Dull Other Clinician: Referring Provider: Duncan Dull Treating Provider/Extender: Altamese [place] in Treatment: 26 Constitutional Sitting or standing Blood Pressure is within target range for patient.. Pulse regular and within target range for patient.Marland Kitchen Respirations regular, non-labored and within target range.. Temperature is normal and within the target range for the patient.Marland Kitchen appears in no distress. Notes Wound exam; the area on the lateral right heel remains fully epithelialized and is healed. On the left. Her heel. Still a small wound with some reduction in surface area from last time albeit not substantial. She has a necrotic surface of the wound which is debrided with a #3 curet. Hemostasis with direct pressure. She tolerated this well Electronic Signature(s) Signed: 02/16/2017 5:28:55 PM By: Baltazar Najjar MD Entered By: Baltazar Najjar on 02/16/2017 10:13:57 [name], [name] ([id number]) -------------------------------------------------------------------------------- Physician Orders Details Patient Name: [name] Date of Service: 02/16/2017 9:00 AM Medical Record Number: [id number] Patient Account Number: [id number] Date of Birth/Sex: 01-21-1936 (80 y.o. Female) Treating RN: Curtis Sites Primary Care  Provider: Duncan Dull Other Clinician: Referring Provider: Duncan Dull Treating Provider/Extender: Altamese [place] in Treatment: 9 Verbal / Phone Orders: Yes Clinician: Curtis Sites Read Back and Verified: Yes Diagnosis Coding Wound Cleansing Wound #2 Left Calcaneus o Clean wound with Normal Saline. o May Shower, gently pat wound dry prior to applying new dressing. Anesthetic Wound #2 Left Calcaneus o Topical Lidocaine 4% cream applied to wound bed prior to debridement Primary Wound Dressing Wound #2 Left Calcaneus o Hydrafera Blue Secondary Dressing Wound #2 Left Calcaneus o Dry Gauze o Conform/Kerlix o Foam - heel cups o Other - stretch netting #4 (please order for pt...daughter is aware) Dressing Change Frequency Wound #2 Left Calcaneus o Change dressing every day. Follow-up Appointments Wound #2 Left Calcaneus o Return Appointment in 2 weeks. Edema Control Wound #2 Left Calcaneus o Elevate legs to the level of the heart and pump ankles as often as possible Off-Loading Wound #2 Left Calcaneus o Turn and reposition every 2 hours o Other: - wear heel protector boots at al times in bed, float heels while in bed, Additional Orders / Instructions Wound #2 Left Calcaneus o Increase protein intake. o Other: - please add vitamin A, vitamin C and zinc supplements to your diet [name], [name] ([id number]) Electronic Signature(s) Signed: 02/16/2017 5:07:57 PM By: Curtis Sites Signed: 02/16/2017 5:28:55 PM By: Baltazar Najjar MD Entered By: Curtis Sites on 02/16/2017 09:34:30 Bachtel, Ashlen ([id number]) -------------------------------------------------------------------------------- Problem List Details Patient Name: [name] Date of Service: 02/16/2017 9:00 AM Medical Record Number: [id number] Patient Account Number: [id number] Date of Birth/Sex: 1935/07/15 (80 y.o. Female) Treating RN: Primary Care Provider: Duncan Dull Other Clinician: Referring Provider: Duncan Dull Treating Provider/Extender: Altamese [place] in Treatment: 26 Active Problems ICD-10 Encounter Code Description Active Date Diagnosis E11.621 Type 2 diabetes mellitus with foot ulcer 08/18/2016 Yes L89.623 Pressure ulcer of left heel, stage  3 08/18/2016 Yes L89.613 Pressure ulcer of right heel, stage 3 08/18/2016 Yes Inactive Problems Resolved Problems Electronic Signature(s) Signed: 02/16/2017 5:28:55 PM By: Baltazar Najjarobson, Brentney Goldbach MD Entered By: Baltazar Najjarobson, Macrae Wiegman on 02/16/2017 10:11:11 [name], [name] ([id number]) -------------------------------------------------------------------------------- Progress Note Details Patient Name: [name] LoboHEELY, Shontavia Date of Service: 02/16/2017 9:00 AM Medical Record Number: [id number] Patient Account Number: [id number] Date of Birth/Sex: 11-08-1935 (80 y.o. Female) Treating RN: Primary Care Provider: Duncan Dullullo, Teresa Other Clinician: Referring Provider: Duncan Dullullo, Teresa Treating Provider/Extender: Altamese CarolinaOBSON, Shatonya [place] in Treatment: 26 Subjective History of Present Illness (HPI) 08/18/16; this is a 81 year old woman who is a type II diabetic not currently on any treatment. She is also listed as having Alzheimer's disease hypertension. She has a history of chronic venous insufficiency with leg wounds in the past but no history of wounds in her feet. In terms of her diabetes at one point she was on insulin however she is no longer on any current treatment, her daughter who is providing most of the history is unaware of her hemoglobin A1c. She has stage IV chronic renal failure and follows with nephrology. The history provided by her daughter is that she has had a wound on the left heel perhaps dating back to last May/17. At that point she was in the hospital predominantly with psychiatric issues and was [facility]. This was treated with some form of foam dressing and may have actually healed  however she was readmitted to hospital in January with Escherichia coli sepsis felt to be secondary to a UTI. Since then she is in [facility]. Apparently this timeframe both the left heel and right heel reopened or at least the daughter became aware that they were both open. The exact timeframe isn't really certain Her ABIs in this clinic were 1.08 on the right 1.25 on the left. Looking through [facility] doesn't really provide any useful information. She did have venous ultrasounds in 2016 that did not show a DVT. I don't see a recent hemoglobin A1c 08/25/16; from last week we have been using Santyl to both heels. Apparently the nursing home didn't get an x-ray of both heels [[facility]] and the daughter states that there was "no injury to bone" but for some reason we haven't been able to get the official report from them. 09/01/16; continued improvement in both wounds on her bilateral heels using Santyl. X-ray report was negative for osteomyelitis 09/08/16; [facility] from [facility]. Using Santyl on both her heel wounds which are fortunately not on the plantar surface. 09/15/16; [facility] is [facility]. She has been using Santyl on both her pressure areas which were stage III. Fortunately these or not on the plantar surface. 09/22/16; patient arrives today with a much less viable looking surface on her heels. We had been using Santyl with good improvement however unchanged either fair last week. This may be unrelated however she required debridement today. 09/29/16; patient arrives with a better looking surface on the left heel unfortunately this is a more substantial wound. The area on the right lateral calcaneus again covered in a nonviable necrotic surface. We have been making good progress with Santyl. I think I'm going to need to go back to a debriding agent. 10/06/16; still nonviable surface material over the right greater  than the left heel. Apparently the facility where this woman resides did not have Iodoflex so rather than calling the clinic they decided to make some cocktail of material and then settled  on Hydrofera Blue. Patient is really generally made pretty good progress versus when she came in her first however still has too much nonviable surface especially on the right. 10/13/16; she arrives today with Iodoflex on both heels. This was the dressing that we couldn't seem to get last week. Her daughter tells [place] that where they were traveling to [place] last weekend she actually use Santyl. Both wounds are in better condition today and didn't seem to require debridement however I'm not exactly sure what is been most helpful 10/20/16; using Santyl to both heels better condition. Nursing reports green drainage 11/03/16 patient wounds appeared to be doing fairly well on evaluation today she seen along with her daughter in the office today. She does have slough covering the wound unfortunately no evidence of infection. 11/10/16; both wounds appear to be making nice progress. This is on the bilateral heels 11/24/16; he also continued to be making nice progress. Wounds are smaller. Using Hydrofera Blue. Orders are to change daily in the nursing home although according to her daughter they're actually being changed to her 3 times a week which is probably satisfactory 12/08/16 on evaluation today patient bilateral hills appear to be making improvement. Improvement is somewhat slow but nonetheless week by week we are noting some change. Overall I'm pleased with the progress that we have seen. Nonetheless patient states that she really would like for this to already be completely healed and closed which obviously it is not. She wonders how long it's gonna be before she can get back to normal walking and wearing shoes. No fevers, chills, [name], [name] ([id number]) nausea, or vomiting noted at this time. Secondary to mental  status patient is unable to rate or describe her pain. 12/22/16; patient continues to make nice progress. The area on the right lateral calcaneus as improved and dimensions quite a bit since the last time I saw this, the area on the left is more substantial but also seems to be making improvement. No debridement was required in any area 01/05/17; the patient came in with the right lateral calcaneus covered an eschar. After removal there is still a small open area here. The area on the left still required debridement of circumferential senescent edges of tissue and nonviable surface material. We've been using Hydrofera Blue and making progress albeit slowly 01/19/17; patient has skilled facility I've been following for substantial right and left calcaneus pressure ulcers. We've been using Hydrofera Blue. The one on the right appears to be closed today. Left down in dimensions 02/02/17; the area on the right lateral heel remains closed. Still be open wound on the left. Dimensions down however not as much as I would like. Using Hydrofera Blue 02/16/17; 2 week follow-up for wound remaining on the right lateral heel. We have been using Hydrofera Blue. The patient states she feels well and has no complaints Objective Constitutional Sitting or standing Blood Pressure is within target range for patient.. Pulse regular and within target range for patient.Marland Kitchen Respirations regular, non-labored and within target range.. Temperature is normal and within the target range for the patient.Marland Kitchen appears in no distress. Vitals Time Taken: 9:18 AM, Height: 66 in, Weight: 140.8 lbs, BMI: 22.7, Temperature: 97.7 F, Pulse: 71 bpm, Respiratory Rate: 16 breaths/min, Blood Pressure: 142/57 mmHg. General Notes: Wound exam; the area on the lateral right heel remains fully epithelialized and is healed. On the left. Her heel. Still a small wound with some reduction in surface area from last time albeit not substantial. She  has a  necrotic surface of the wound which is debrided with a #3 curet. Hemostasis with direct pressure. She tolerated this well Integumentary (Hair, Skin) Wound #2 status is Open. Original cause of wound was Pressure Injury. The wound is located on the Left Calcaneus. The wound measures 0.7cm length x 1.2cm width x 0.1cm depth; 0.66cm^2 area and 0.066cm^3 volume. The wound is limited to skin breakdown. There is no tunneling or undermining noted. There is a large amount of serous drainage noted. Foul odor after cleansing was noted. The wound margin is flat and intact. There is large (67-100%) pink granulation within the wound bed. There is a small (1-33%) amount of necrotic tissue within the wound bed including Eschar and Adherent Slough. The periwound skin appearance did not exhibit: Callus, Crepitus, Excoriation, Induration, Rash, Scarring, Dry/Scaly, Maceration, Atrophie Blanche, Cyanosis, Ecchymosis, Hemosiderin Staining, Mottled, Pallor, Rubor, Erythema. Periwound temperature was noted as No Abnormality. The periwound has tenderness on palpation. Assessment Active Problems ICD-10 E11.621 - Type 2 diabetes mellitus with foot ulcer L89.623 - Pressure ulcer of left heel, stage 3 [name], [name] ([id number]) N82.956 - Pressure ulcer of right heel, stage 3 Procedures Wound #2 Pre-procedure diagnosis of Wound #2 is a Pressure Ulcer located on the Left Calcaneus . There was a Skin/Subcutaneous Tissue Debridement ([id number]) debridement with total area of 0.84 sq cm performed by Maxwell Caul, MD. with the following instrument(s): Curette to remove Viable and Non-Viable tissue/material including Exudate, Fibrin/Slough, and Subcutaneous after achieving pain control using Lidocaine 4% Topical Solution. A time out was conducted at 09:32, prior to the start of the procedure. A Minimum amount of bleeding was controlled with Pressure. The procedure was tolerated well with a pain level of 0 throughout  and a pain level of 0 following the procedure. Post Debridement Measurements: 0.7cm length x 1.2cm width x 0.2cm depth; 0.132cm^3 volume. Post debridement Stage noted as Category/Stage III. Character of Wound/Ulcer Post Debridement requires further debridement. Post procedure Diagnosis Wound #2: Same as Pre-Procedure Plan Wound Cleansing: Wound #2 Left Calcaneus: Clean wound with Normal Saline. May Shower, gently pat wound dry prior to applying new dressing. Anesthetic: Wound #2 Left Calcaneus: Topical Lidocaine 4% cream applied to wound bed prior to debridement Primary Wound Dressing: Wound #2 Left Calcaneus: Hydrafera Blue Secondary Dressing: Wound #2 Left Calcaneus: Dry Gauze Conform/Kerlix Foam - heel cups Other - stretch netting #4 (please order for pt...daughter is aware) Dressing Change Frequency: Wound #2 Left Calcaneus: Change dressing every day. Follow-up Appointments: Wound #2 Left Calcaneus: Return Appointment in 2 weeks. Edema Control: Wound #2 Left Calcaneus: Elevate legs to the level of the heart and pump ankles as often as possible Off-Loading: Wound #2 Left Calcaneus: Turn and reposition every 2 hours Other: - wear heel protector boots at al times in bed, float heels while in bed, Additional Orders / Instructions: Wound #2 Left Calcaneus: [name], [name] ([id number]) Increase protein intake. Other: - please add vitamin A, vitamin C and zinc supplements to your diet #1 continue with Hydrofera Blue for this go round of 2 weeks #2 if this is not smaller substantially by next time I'll need to change the approach here #3 there is no evidence of infection Electronic Signature(s) Signed: 02/16/2017 5:28:55 PM By: Baltazar Najjar MD Entered By: Baltazar Najjar on 02/16/2017 10:14:36 [name], [name] ([id number]) -------------------------------------------------------------------------------- SuperBill Details Patient Name: [name] Date of Service:  02/16/2017 Medical Record Number: [id number] Patient Account Number: [id number] Date of Birth/Sex: 11-04-1935 (80 y.o. Female) Treating RN: Primary Care  Provider: Duncan Dull Other Clinician: Referring Provider: Duncan Dull Treating Provider/Extender: Altamese [place] in Treatment: 26 Diagnosis Coding ICD-10 Codes Code Description E11.621 Type 2 diabetes mellitus with foot ulcer L89.623 Pressure ulcer of left heel, stage 3 L89.613 Pressure ulcer of right heel, stage 3 Facility Procedures CPT4 Code: [id number] Description: 11042 - DEB SUBQ TISSUE 20 SQ CM/< ICD-10 Diagnosis Description E11.621 Type 2 diabetes mellitus with foot ulcer L89.623 Pressure ulcer of left heel, stage 3 Modifier: Quantity: 1 Physician Procedures CPT4 Code: 6578469 Description: 11042 - WC PHYS SUBQ TISS 20 SQ CM ICD-10 Diagnosis Description E11.621 Type 2 diabetes mellitus with foot ulcer L89.623 Pressure ulcer of left heel, stage 3 Modifier: Quantity: 1 Electronic Signature(s) Signed: 02/16/2017 5:28:55 PM By: Baltazar Najjar MD Entered By: Baltazar Najjar on 02/16/2017 10:15:03

## 2017-02-18 NOTE — Progress Notes (Signed)
[name], [name] ([id number]) Visit Report for 02/16/2017 Arrival Information Details Patient Name: [name], [name] Date of Service: 02/16/2017 9:00 AM Medical Record Number: [id number] Patient Account Number: [id number] Date of Birth/Sex: 28-Mar-1936 (80 y.o. Female) Treating RN: Curtis Sites Primary Care Walker Sitar: Duncan Dull Other Clinician: Referring Hong Timm: Duncan Dull Treating Raydan Schlabach/Extender: Altamese [place] in Treatment: 26 Visit Information History Since Last Visit Added or deleted any medications: No Patient Arrived: Walker Any new allergies or adverse reactions: No Arrival Time: 09:17 Had a fall or experienced change in No Accompanied By: self activities of daily living that may affect Transfer Assistance: None risk of falls: Patient Identification Verified: Yes Signs or symptoms of abuse/neglect since last visito No Secondary Verification Process Completed: Yes Hospitalized since last visit: No Patient Requires Transmission-Based Precautions: No Has Dressing in Place as Prescribed: Yes Patient Has Alerts: Yes Pain Present Now: No Patient Alerts: DM II Electronic Signature(s) Signed: 02/16/2017 5:07:57 PM By: Curtis Sites Entered By: Curtis Sites on 02/16/2017 09:18:06 [name], [name] ([id number]) -------------------------------------------------------------------------------- Encounter Discharge Information Details Patient Name: [name] Date of Service: 02/16/2017 9:00 AM Medical Record Number: [id number] Patient Account Number: [id number] Date of Birth/Sex: 1935-08-19 (80 y.o. Female) Treating RN: Curtis Sites Primary Care Talasia Saulter: Duncan Dull Other Clinician: Referring Akyra Bouchie: Duncan Dull Treating Aaren Krog/Extender: Altamese [place] in Treatment: 12 Encounter Discharge Information Items Discharge Pain Level: 0 Discharge Condition: Stable Ambulatory Status: Walker Nursing Discharge Destination: Home Transportation:  Other Accompanied By: self Schedule Follow-up Appointment: Yes Medication Reconciliation completed and provided No to Patient/Care Shante [place]: Clinical Summary of Care: Electronic Signature(s) Signed: 02/16/2017 5:07:57 PM By: Curtis Sites Entered By: Curtis Sites on 02/16/2017 09:31:06 [name], [name] ([id number]) -------------------------------------------------------------------------------- Lower Extremity Assessment Details Patient Name: [name] Date of Service: 02/16/2017 9:00 AM Medical Record Number: [id number] Patient Account Number: [id number] Date of Birth/Sex: [birth year] (80 y.o. Female) Treating RN: Curtis Sites Primary Care Drayton Tieu: Duncan Dull Other Clinician: Referring Cyan Moultrie: Duncan Dull Treating Kielee Care/Extender: Altamese [place] in Treatment: 26 Vascular Assessment Pulses: Dorsalis Pedis Palpable: [Left:Yes] Posterior Tibial Extremity colors, hair growth, and conditions: Extremity Color: [Left:Hyperpigmented] Temperature of Extremity: [Left:Warm] Capillary Refill: [Left:< 3 seconds] Toe Nail Assessment Left: Right: Thick: Yes Discolored: Yes Deformed: No Improper Length and Hygiene: No Electronic Signature(s) Signed: 02/16/2017 5:07:57 PM By: Curtis Sites Entered By: Curtis Sites on 02/16/2017 09:30:24 [name], [name] ([id number]) -------------------------------------------------------------------------------- Multi Wound Chart Details Patient Name: [name] Date of Service: 02/16/2017 9:00 AM Medical Record Number: [id number] Patient Account Number: [id number] Date of Birth/Sex: October 03, 1935 (80 y.o. Female) Treating RN: Curtis Sites Primary Care Tahja Liao: Duncan Dull Other Clinician: Referring Adline Kirshenbaum: Duncan Dull Treating Kayden Amend/Extender: Altamese [place] in Treatment: 26 Vital Signs Height(in): 66 Pulse(bpm): 71 Weight(lbs): 140.8 Blood Pressure(mmHg): 142/57 Body Mass Index(BMI):  23 Temperature(F): 97.7 Respiratory Rate 16 (breaths/min): Photos: [2:No Photos] [N/A:N/A] Wound Location: [2:Left Calcaneus] [N/A:N/A] Wounding Event: [2:Pressure Injury] [N/A:N/A] Primary Etiology: [2:Pressure Ulcer] [N/A:N/A] Comorbid History: [2:Cataracts, Anemia, Congestive Heart Failure, Hypertension, Type II Diabetes, Gout, Dementia, Neuropathy] [N/A:N/A] Date Acquired: [2:07/05/2016] [N/A:N/A] Weeks of Treatment: [2:26] [N/A:N/A] Wound Status: [2:Open] [N/A:N/A] Measurements L x W x D [2:0.7x1.2x0.1] [N/A:N/A] (cm) Area (cm) : [2:0.66] [N/A:N/A] Volume (cm) : [2:0.066] [N/A:N/A] % Reduction in Area: [2:94.00%] [N/A:N/A] % Reduction in Volume: [2:97.00%] [N/A:N/A] Classification: [2:Category/Stage III] [N/A:N/A] Exudate Amount: [2:Large] [N/A:N/A] Exudate Type: [2:Serous] [N/A:N/A] Exudate Color: [2:amber] [N/A:N/A] Foul Odor After Cleansing: [2:Yes] [N/A:N/A] Odor Anticipated Due to [2:No] [N/A:N/A] Product Use: Wound Margin: [2:Flat and Intact] [N/A:N/A] Granulation Amount: [2:Large (67-100%)] [N/A:N/A] Granulation  Quality: [2:Pink] [N/A:N/A] Necrotic Amount: [2:Small (1-33%)] [N/A:N/A] Necrotic Tissue: [2:Eschar, Adherent Slough] [N/A:N/A] Exposed Structures: [2:Fascia: No Fat Layer (Subcutaneous Tissue) Exposed: No Tendon: No Muscle: No Joint: No] [N/A:N/A] Bone: No Limited to Skin Breakdown Epithelialization: None N/A N/A Debridement: Debridement ([id number]) N/A N/A Pre-procedure 09:32 N/A N/A Verification/Time Out Taken: Pain Control: Lidocaine 4% Topical Solution N/A N/A Tissue Debrided: Fibrin/Slough, Exudates, N/A N/A Subcutaneous Level: Skin/Subcutaneous Tissue N/A N/A Debridement Area (sq cm): 0.84 N/A N/A Instrument: Curette N/A N/A Bleeding: Minimum N/A N/A Hemostasis Achieved: Pressure N/A N/A Procedural Pain: 0 N/A N/A Post Procedural Pain: 0 N/A N/A Debridement Treatment Procedure was tolerated well N/A N/A Response: Post Debridement  0.7x1.2x0.2 N/A N/A Measurements L x W x D (cm) Post Debridement Volume: 0.132 N/A N/A (cm) Post Debridement Stage: Category/Stage III N/A N/A Periwound Skin Texture: Excoriation: No N/A N/A Induration: No Callus: No Crepitus: No Rash: No Scarring: No Periwound Skin Moisture: Maceration: No N/A N/A Dry/Scaly: No Periwound Skin Color: Atrophie Blanche: No N/A N/A Cyanosis: No Ecchymosis: No Erythema: No Hemosiderin Staining: No Mottled: No Pallor: No Rubor: No Temperature: No Abnormality N/A N/A Tenderness on Palpation: Yes N/A N/A Wound Preparation: Ulcer Cleansing: N/A N/A Rinsed/Irrigated with Saline Topical Anesthetic Applied: Other: lidocaine 4% Procedures Performed: Debridement N/A N/A Treatment Notes Wound #2 (Left Calcaneus) 1. Cleansed with: Clean wound with Normal Saline 2. Anesthetic Topical Lidocaine 4% cream to wound bed prior to debridement 4. Dressing Applied: Hale BogusHydrafera Blue Pence, [place] ([id number]) 5. Secondary Dressing Applied Dry Gauze Kerlix/Conform 7. Secured with Tape Notes heel cup, netting Electronic Signature(s) Signed: 02/16/2017 5:28:55 PM By: Baltazar Najjarobson, [name] MD Entered By: Baltazar Najjarobson, [name] on 02/16/2017 10:11:35 [name], [name] ([id number]) -------------------------------------------------------------------------------- Multi-Disciplinary Care Plan Details Patient Name: [name] LoboHEELY, Hajra Date of Service: 02/16/2017 9:00 AM Medical Record Number: [id number] Patient Account Number: [id number] Date of Birth/Sex: 1935-11-09 (80 y.o. Female) Treating RN: Curtis Sitesorthy, Joanna Primary Care Marley Pakula: Duncan Dullullo, Teresa Other Clinician: Referring Britain Anagnos: Duncan Dullullo, Teresa Treating Armel Rabbani/Extender: Altamese CarolinaOBSON, [name] G Weeks in Treatment: 1126 Active Inactive ` Abuse / Safety / Falls / Self Care Management Nursing Diagnoses: Impaired physical mobility Potential for falls Goals: Patient will remain injury free Date Initiated: 08/18/2016 Target  Resolution Date: 10/29/2016 Goal Status: Active Interventions: Assess fall risk on admission and as needed Notes: ` Nutrition Nursing Diagnoses: Potential for alteratiion in Nutrition/Potential for imbalanced nutrition Goals: Patient/caregiver agrees to and verbalizes understanding of need to use nutritional supplements and/or vitamins as prescribed Date Initiated: 08/18/2016 Target Resolution Date: 10/29/2016 Goal Status: Active Interventions: Assess patient nutrition upon admission and as needed per policy Notes: ` Orientation to the Wound Care Program Nursing Diagnoses: Knowledge deficit related to the wound healing center program Goals: Patient/caregiver will verbalize understanding of the Wound Healing Center Program Date Initiated: 08/18/2016 Target Resolution Date: 10/29/2016 Goal Status: Active [name], [name] ([id number]) Interventions: Provide education on orientation to the wound center Notes: ` Wound/Skin Impairment Nursing Diagnoses: Impaired tissue integrity Goals: Patient/caregiver will verbalize understanding of skin care regimen Date Initiated: 08/18/2016 Target Resolution Date: 10/29/2016 Goal Status: Active Ulcer/skin breakdown will have a volume reduction of 30% by week 4 Date Initiated: 08/18/2016 Target Resolution Date: 10/29/2016 Goal Status: Active Ulcer/skin breakdown will have a volume reduction of 50% by week 8 Date Initiated: 08/18/2016 Target Resolution Date: 10/29/2016 Goal Status: Active Ulcer/skin breakdown will have a volume reduction of 80% by week 12 Date Initiated: 08/18/2016 Target Resolution Date: 10/29/2016 Goal Status: Active Ulcer/skin breakdown will heal within 14 weeks Date Initiated: 08/18/2016 Target Resolution Date: 10/29/2016  Goal Status: Active Interventions: Assess patient/caregiver ability to obtain necessary supplies Assess patient/caregiver ability to perform ulcer/skin care regimen upon admission and as needed Assess ulceration(s)  every visit Notes: Electronic Signature(s) Signed: 02/16/2017 5:07:57 PM By: Curtis Sites Entered By: Curtis Sites on 02/16/2017 09:30:29 [name], [name] -------------------------------------------------------------------------------- Pain Assessment Details Patient Name: [name] Date of Service: 02/16/2017 9:00 AM Medical Record Number: [id number] Patient Account Number: [id number] Date of Birth/Sex: 02/09/1936 (80 y.o. Female) Treating RN: Curtis Sites Primary Care Kabrea Seeney: Duncan Dull Other Clinician: Referring Tanaysia Bhardwaj: Duncan Dull Treating Tyrion Glaude/Extender: Altamese [place] in Treatment: 26 Active Problems Location of Pain Severity and Description of Pain Patient Has Paino No Site Locations Pain Management and Medication Current Pain Management: Notes Topical or injectable lidocaine is offered to patient for acute pain when surgical debridement is performed. If needed, Patient is instructed to use over the counter pain medication for the following 24-48 hours after debridement. Wound care MDs do not prescribed pain medications. Patient has chronic pain or uncontrolled pain. Patient has been instructed to make an appointment with their Primary Care Physician for pain management. Electronic Signature(s) Signed: 02/16/2017 5:07:57 PM By: Curtis Sites Entered By: Curtis Sites on 02/16/2017 09:18:16 [name], [name] ([id number]) -------------------------------------------------------------------------------- Patient/Caregiver Education Details Patient Name: [name] Date of Service: 02/16/2017 9:00 AM Medical Record Number: [id number] Patient Account Number: [id number] Date of Birth/Gender: [birth year] (80 y.o. Female) Treating RN: Curtis Sites Primary Care Physician: Duncan Dull Other Clinician: Referring Physician: Duncan Dull Treating Physician/Extender: Altamese [place] in Treatment: 34 Education  Assessment Education Provided To: Patient Education Topics Provided Wound/Skin Impairment: Handouts: Other: notes/orders sent with pt Methods: Demonstration, Explain/Verbal Responses: State content correctly Electronic Signature(s) Signed: 02/16/2017 5:07:57 PM By: Curtis Sites Entered By: Curtis Sites on 02/16/2017 09:31:29 [name], [name] ([id number]) -------------------------------------------------------------------------------- Wound Assessment Details Patient Name: [name] Date of Service: 02/16/2017 9:00 AM Medical Record Number: [id number] Patient Account Number: [id number] Date of Birth/Sex: 04/10/1936 (80 y.o. Female) Treating RN: Curtis Sites Primary Care Toshiko Kemler: Duncan Dull Other Clinician: Referring Vinny Taranto: Duncan Dull Treating Tanija Germani/Extender: Altamese  in Treatment: 26 Wound Status Wound Number: 2 Primary Pressure Ulcer Etiology: Wound Location: Left Calcaneus Wound Open Wounding Event: Pressure Injury Status: Date Acquired: 07/05/2016 Comorbid Cataracts, Anemia, Congestive Heart Failure, Weeks Of Treatment: 26 History: Hypertension, Type II Diabetes, Gout, Clustered Wound: No Dementia, Neuropathy Photos Photo Uploaded By: Curtis Sites on 02/16/2017 16:21:14 Wound Measurements Length: (cm) 0.7 Width: (cm) 1.2 Depth: (cm) 0.1 Area: (cm) 0.66 Volume: (cm) 0.066 % Reduction in Area: 94% % Reduction in Volume: 97% Epithelialization: None Tunneling: No Undermining: No Wound Description Classification: Category/Stage III Wound Margin: Flat and Intact Exudate Amount: Large Exudate Type: Serous Exudate Color: amber Foul Odor After Cleansing: Yes Due to Product Use: No Slough/Fibrino No Wound Bed Granulation Amount: Large (67-100%) Exposed Structure Granulation Quality: Pink Fascia Exposed: No Necrotic Amount: Small (1-33%) Fat Layer (Subcutaneous Tissue) Exposed: No Necrotic Quality: Eschar, Adherent  Slough Tendon Exposed: No Muscle Exposed: No Joint Exposed: No Bone Exposed: No Limited to Skin Breakdown [name], [name] ([id number]) Periwound Skin Texture Texture Color No Abnormalities Noted: No No Abnormalities Noted: No Callus: No Atrophie Blanche: No Crepitus: No Cyanosis: No Excoriation: No Ecchymosis: No Induration: No Erythema: No Rash: No Hemosiderin Staining: No Scarring: No Mottled: No Pallor: No Moisture Rubor: No No Abnormalities Noted: No Dry / Scaly: No Temperature / Pain Maceration: No Temperature: No Abnormality Tenderness on Palpation: Yes Wound Preparation Ulcer Cleansing: Rinsed/Irrigated with Saline Topical Anesthetic  Applied: Other: lidocaine 4%, Treatment Notes Wound #2 (Left Calcaneus) 1. Cleansed with: Clean wound with Normal Saline 2. Anesthetic Topical Lidocaine 4% cream to wound bed prior to debridement 4. Dressing Applied: Hydrafera Blue 5. Secondary Dressing Applied Dry Gauze Kerlix/Conform 7. Secured with Tape Notes heel cup, netting Electronic Signature(s) Signed: 02/16/2017 5:07:57 PM By: Curtis Sites Entered By: Curtis Sites on 02/16/2017 09:30:01 [name], Shelvy ([id number]) -------------------------------------------------------------------------------- Vitals Details Patient Name: [name] Date of Service: 02/16/2017 9:00 AM Medical Record Number: [id number] Patient Account Number: [id number] Date of Birth/Sex: 07/27/1935 (80 y.o. Female) Treating RN: Curtis Sites Primary Care Spero Gunnels: Duncan Dull Other Clinician: Referring Rondale Nies: Duncan Dull Treating Marquavius Scaife/Extender: Altamese [place] in Treatment: 26 Vital Signs Time Taken: 09:18 Temperature (F): 97.7 Height (in): 66 Pulse (bpm): 71 Weight (lbs): 140.8 Respiratory Rate (breaths/min): 16 Body Mass Index (BMI): 22.7 Blood Pressure (mmHg): 142/57 Reference Range: 80 - 120 mg / dl Electronic Signature(s) Signed: 02/16/2017 5:07:57  PM By: Curtis Sites Entered By: Curtis Sites on 02/16/2017 09:18:38

## 2017-03-01 ENCOUNTER — Encounter: Payer: Medicare (Managed Care) | Attending: Internal Medicine | Admitting: Internal Medicine

## 2017-03-01 DIAGNOSIS — L89613 Pressure ulcer of right heel, stage 3: Secondary | ICD-10-CM | POA: Diagnosis not present

## 2017-03-01 DIAGNOSIS — I129 Hypertensive chronic kidney disease with stage 1 through stage 4 chronic kidney disease, or unspecified chronic kidney disease: Secondary | ICD-10-CM | POA: Insufficient documentation

## 2017-03-01 DIAGNOSIS — G2111 Neuroleptic induced parkinsonism: Secondary | ICD-10-CM | POA: Diagnosis not present

## 2017-03-01 DIAGNOSIS — E1140 Type 2 diabetes mellitus with diabetic neuropathy, unspecified: Secondary | ICD-10-CM | POA: Insufficient documentation

## 2017-03-01 DIAGNOSIS — L89623 Pressure ulcer of left heel, stage 3: Secondary | ICD-10-CM | POA: Insufficient documentation

## 2017-03-01 DIAGNOSIS — N184 Chronic kidney disease, stage 4 (severe): Secondary | ICD-10-CM | POA: Diagnosis not present

## 2017-03-01 DIAGNOSIS — E11621 Type 2 diabetes mellitus with foot ulcer: Secondary | ICD-10-CM | POA: Insufficient documentation

## 2017-03-01 DIAGNOSIS — F0280 Dementia in other diseases classified elsewhere without behavioral disturbance: Secondary | ICD-10-CM | POA: Insufficient documentation

## 2017-03-01 DIAGNOSIS — G309 Alzheimer's disease, unspecified: Secondary | ICD-10-CM | POA: Insufficient documentation

## 2017-03-01 DIAGNOSIS — E1122 Type 2 diabetes mellitus with diabetic chronic kidney disease: Secondary | ICD-10-CM | POA: Diagnosis not present

## 2017-03-03 NOTE — Progress Notes (Signed)
[name], Koenig [name] ([id number]) Visit Report for 03/01/2017 HPI Details Patient Name: [name], [name] Date of Service: 03/01/2017 1:30 PM Medical Record Number: [id number] Patient Account Number: [id number] Date of Birth/Sex: 02/11/1936 (80 y.o. Female) Treating RN: Curtis Sites Primary Care Provider: Duncan Dull Other Clinician: Referring Provider: Duncan Dull Treating Provider/Extender: Altamese [place] in Treatment: 27 History of Present Illness HPI Description: 08/18/16; this is a 81 year old woman who is a type II diabetic not currently on any treatment. She is also listed as having Alzheimer's disease hypertension. She has a history of chronic venous insufficiency with leg wounds in the past but no history of wounds in her feet. In terms of her diabetes at one point she was on insulin however she is no longer on any current treatment, her daughter who is providing most of the history is unaware of her hemoglobin A1c. She has stage IV chronic renal failure and follows with nephrology. The history provided by her daughter is that she has had a wound on the left heel perhaps dating back to last May/17. At that point she was in the hospital predominantly with psychiatric issues and was discharged [facility]. This was treated with some form of foam dressing and may have actually healed however she was readmitted to hospital in January with Escherichia coli sepsis felt to be secondary to a UTI. Since then she is in [facility] skilled facility. Apparently this timeframe both the left heel and right heel reopened or at least the daughter became aware that they were both open. The exact timeframe isn't really certain Her ABIs in this clinic were 1.08 on the right 1.25 on the left. Looking through [employer] doesn't really provide any useful information. She did have venous ultrasounds in 2016 that did not show a DVT. I don't see a recent hemoglobin A1c 08/25/16; from last week we  have been using Santyl to both heels. Apparently the nursing home didn't get an x-ray of both heels [[facility]] and the daughter states that there was "no injury to bone" but for some reason we haven't been able to get the official report from them. 09/01/16; continued improvement in both wounds on her bilateral heels using Santyl. X-ray report was negative for osteomyelitis 09/08/16; patient from [facility] skilled facility. Using Santyl on both her heel wounds which are fortunately not on the plantar surface. 09/15/16; patient is from [facility] skilled facility. She has been using Santyl on both her pressure areas which were stage III. Fortunately these or not on the plantar surface. 09/22/16; patient arrives today with a much less viable looking surface on her heels. We had been using Santyl with good improvement however unchanged either fair last week. This may be unrelated however she required debridement today. 09/29/16; patient arrives with a better looking surface on the left heel unfortunately this is a more substantial wound. The area on the right lateral calcaneus again covered in a nonviable necrotic surface. We have been making good progress with Santyl. I think I'm going to need to go back to a debriding agent. 10/06/16; still nonviable surface material over the right greater than the left heel. Apparently the facility where this woman resides did not have Iodoflex so rather than calling the clinic they decided to make some cocktail of material and then settled on Hydrofera Blue. Patient is really generally made pretty good progress versus when she came in her first however still has too much nonviable surface especially on the right. 10/13/16; she  arrives today with Iodoflex on both heels. This was the dressing that we couldn't seem to get last week. Her daughter tells [place] that where [place] were traveling to [place] last weekend she actually use Santyl. Both wounds are in  better condition today and didn't seem to require debridement however I'm not exactly sure what is been most helpful 10/20/16; using Santyl to both heels better condition. Nursing reports green drainage 11/03/16 patient wounds appeared to be doing fairly well on evaluation today she seen along with her daughter in the office today. She does have slough covering the wound unfortunately no evidence of infection. 11/10/16; both wounds appear to be making nice progress. This is on the bilateral heels 11/24/16; he also continued to be making nice progress. Wounds are smaller. Using Hydrofera Blue. Orders are to change daily in the nursing home although according to her daughter they're actually being changed to her 3 times a week which is probably satisfactory 12/08/16 on evaluation today patient bilateral hills appear to be making improvement. Improvement is somewhat slow but nonetheless week by week we are noting some change. Overall I'm pleased with the progress that we have seen. [name] ([id number]) Nonetheless patient states that she really would like for this to already be completely healed and closed which obviously it is not. She wonders how long it's gonna be before she can get back to normal walking and wearing shoes. No fevers, chills, nausea, or vomiting noted at this time. Secondary to mental status patient is unable to rate or describe her pain. 12/22/16; patient continues to make nice progress. The area on the right lateral calcaneus as improved and dimensions quite a bit since the last time I saw this, the area on the left is more substantial but also seems to be making improvement. No debridement was required in any area 01/05/17; the patient came in with the right lateral calcaneus covered an eschar. After removal there is still a small open area here. The area on the left still required debridement of circumferential senescent edges of tissue and nonviable surface material. We've  been using Hydrofera Blue and making progress albeit slowly 01/19/17; patient has skilled facility I've been following for substantial right and left calcaneus pressure ulcers. We've been using Hydrofera Blue. The one on the right appears to be closed today. Left down in dimensions 02/02/17; the area on the right lateral heel remains closed. Still be open wound on the left. Dimensions down however not as much as I would like. Using Hydrofera Blue 02/16/17; 2 week follow-up for wound remaining on the right lateral heel. We have been using Hydrofera Blue. The patient states she feels well and has no complaints 03/01/17; 2 week follow-up for the wound remaining on her right lateral heel which looks quite good today we've been using Hydrofera Blue as the primary dressing. Our intake nurse reports that the patient is somewhat listless and not nearly as steady on her feet as usual. This information is seconded by the patient's daughter who is present. The patient is at a nursing home. In discussing things with her daughter she apparently has a history of psychosis predating some dementia. She is on Risperdal 3 mg a day. Per the daughter she has not had any recent psychosis. The patient denies fever, chills, sore throat, shortness of breath, diarrhea or dysuria.. She is not had any headaches but states her balance is poor Electronic Signature(s) Signed: 03/02/2017 8:07:20 AM By: Baltazar Najjarobson, Taevyn Hausen MD Entered By: Baltazar Najjarobson, Brysten Reister on  03/01/2017 14:20:39 [name], [name] -------------------------------------------------------------------------------- Physical Exam Details Patient Name: [name], [name] Date of Service: 03/01/2017 1:30 PM Medical Record Number: [id number] Patient Account Number: [id number] Date of Birth/Sex: 08/19/1935 (80 y.o. Female) Treating RN: Curtis Sites Primary Care Provider: Duncan Dull Other Clinician: Referring Provider: Duncan Dull Treating Provider/Extender: Altamese [place] in Treatment: 27 Constitutional Sitting or standing Blood Pressure is within target range for patient.. Pulse regular and within target range for patient.Marland Kitchen Respirations regular, non-labored and within target range.. Temperature is normal and within the target range for the patient.. she has no facial expression tends to lean to the right.. Eyes Conjunctivae clear. No discharge. Respiratory Respiratory effort is easy and symmetric bilaterally. Rate is normal at rest and on room air.. Bilateral breath sounds are clear and equal in all lobes with no wheezes, rales or rhonchi.. Cardiovascular Heart rhythm and rate regular, without murmur or gallop.. Gastrointestinal (GI) Abdomen is soft and non-distended without masses or tenderness. Bowel sounds active in all quadrants.. No liver or spleen enlargement or tenderness.. Genitourinary (GU) Bladder without fullness, masses or tenderness.. Integumentary (Hair, Skin) no rash. Neurological mild episodic tremor. Increased tone with cogwheeling. Psychiatric No evidence of depression, anxiety, or agitation. Calm, cooperative, and communicative. Appropriate interactions and affect.. Notes wound exam; the area on the left lateral heel continues to close down nicely. It appears to have healthy granulation and advancing epithelialization.she had a mirror image location on the right lateral heel which is also closed Electronic Signature(s) Signed: 03/02/2017 8:07:20 AM By: Baltazar Najjar MD Entered By: Baltazar Najjar on 03/01/2017 14:24:15 [name], [name] ([id number]) -------------------------------------------------------------------------------- Physician Orders Details Patient Name: [name] Date of Service: 03/01/2017 1:30 PM Medical Record Number: [id number] Patient Account Number: [id number] Date of Birth/Sex: 1936-01-12 (80 y.o. Female) Treating RN: Curtis Sites Primary Care Provider: Duncan Dull Other  Clinician: Referring Provider: Duncan Dull Treating Provider/Extender: Altamese [place] in Treatment: 24 Verbal / Phone Orders: No Diagnosis Coding Wound Cleansing Wound #2 Left Calcaneus o Clean wound with Normal Saline. o May Shower, gently pat wound dry prior to applying new dressing. Anesthetic Wound #2 Left Calcaneus o Topical Lidocaine 4% cream applied to wound bed prior to debridement Primary Wound Dressing Wound #2 Left Calcaneus o Hydrafera Blue Secondary Dressing Wound #2 Left Calcaneus o Dry Gauze o Conform/Kerlix o Foam - heel cups o Other - stretch netting #4 (please order for pt...daughter is aware) Dressing Change Frequency Wound #2 Left Calcaneus o Change dressing every day. Follow-up Appointments Wound #2 Left Calcaneus o Return Appointment in 2 weeks. Edema Control Wound #2 Left Calcaneus o Elevate legs to the level of the heart and pump ankles as often as possible Off-Loading Wound #2 Left Calcaneus o Turn and reposition every 2 hours o Other: - wear heel protector boots at al times in bed, float heels while in bed, Additional Orders / Instructions Wound #2 Left Calcaneus o Increase protein intake. o Other: - please add vitamin A, vitamin C and zinc supplements to your diet Oleson, Kimmy ([id number]) Electronic Signature(s) Signed: 03/01/2017 4:46:00 PM By: Curtis Sites Signed: 03/02/2017 8:07:20 AM By: Baltazar Najjar MD Entered By: Curtis Sites on 03/01/2017 13:49:34 [name], [name] ([id number]) -------------------------------------------------------------------------------- Problem List Details Patient Name: [name] Date of Service: 03/01/2017 1:30 PM Medical Record Number: [id number] Patient Account Number: [id number] Date of Birth/Sex: June 07, 1935 (80 y.o. 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 Female) Treating RN: Curtis Sites Primary Care Provider: Duncan Dull Other Clinician: Referring Provider: Duncan Dull Treating Provider/Extender: Altamese [place] in Treatment: 27 Subjective History of Present Illness (HPI) 08/18/16; this is a 81 year old woman who is a type II diabetic not currently on any treatment. She is also listed as having Alzheimer's disease hypertension. She has a history of chronic venous insufficiency with leg wounds in the past but no history of wounds in her feet. In terms of her diabetes at one point she was on insulin however she is no longer on any current treatment, her daughter who is providing most of the history is unaware of her hemoglobin A1c. She has stage IV chronic renal failure and follows with nephrology. The history provided by her daughter is that she has had a wound on the left heel perhaps dating back to last May/17. At that point she was in the hospital predominantly with psychiatric issues and was discharged to [facility]. This was treated with some form of foam dressing and may have actually healed  however she was readmitted to hospital in January with Escherichia coli sepsis felt to be secondary to a UTI. Since then she is in [facility] skilled facility. Apparently this timeframe both the left heel and right heel reopened or at least the daughter became aware that they were both open. The exact timeframe isn't really certain Her ABIs in this clinic were 1.08 on the right 1.25 on the left. Looking through [employer] doesn't really provide any useful information. She did have venous ultrasounds in 2016 that did not show a DVT. I don't see a recent hemoglobin A1c 08/25/16; from last week we have been using Santyl to both heels. Apparently the nursing home didn't get an x-ray of both heels [[facility]] and the daughter states that there was "no injury to bone" but for some reason we haven't been able to get the official report from them. 09/01/16; continued improvement in both wounds on her bilateral heels using Santyl. X-ray report was negative for osteomyelitis 09/08/16; patient from [facility] skilled facility. Using Santyl on both her heel wounds which are fortunately not on the plantar surface. 09/15/16; patient is from [facility] skilled facility. She has been using Santyl on both her pressure areas which were stage III. Fortunately these or not on the plantar surface. 09/22/16; patient arrives today with a much less viable looking surface on her heels. We had been using Santyl with good improvement however unchanged either fair last week. This may be unrelated however she required debridement today. 09/29/16; patient arrives with a better looking surface on the left heel unfortunately this is a more substantial wound. The area on the right lateral calcaneus again covered in a nonviable necrotic surface. We have been making good progress with Santyl. I think I'm going to need to go back to a debriding agent. 10/06/16; still nonviable surface material over the  right greater  than the left heel. Apparently the facility where this woman resides did not have Iodoflex so rather than calling the clinic they decided to make some cocktail of material and then settled on Hydrofera Blue. Patient is really generally made pretty good progress versus when she came in her first however still has too much nonviable surface especially on the right. 10/13/16; she arrives today with Iodoflex on both heels. This was the dressing that we couldn't seem to get last week. Her daughter tells [place] that where they were traveling to [place] last weekend she actually use Santyl. Both wounds are in better condition today and didn't seem to require debridement however I'm not exactly sure what is been most helpful 10/20/16; using Santyl to both heels better condition. Nursing reports green drainage 11/03/16 patient wounds appeared to be doing fairly well on evaluation today she seen along with her daughter in the office today. She does have slough covering the wound unfortunately no evidence of infection. 11/10/16; both wounds appear to be making nice progress. This is on the bilateral heels 11/24/16; he also continued to be making nice progress. Wounds are smaller. Using Hydrofera Blue. Orders are to change daily in the nursing home although according to her daughter they're actually being changed to her 3 times a week which is probably satisfactory 12/08/16 on evaluation today patient bilateral hills appear to be making improvement. Improvement is somewhat slow but nonetheless week by week we are noting some change. Overall I'm pleased with the progress that we have seen. Nonetheless patient states that she really would like for this to already be completely healed and closed which obviously it is not. She wonders how long it's gonna be before she can get back to normal walking and wearing shoes. No fevers, chills, [name], [name] ([id number]) nausea, or vomiting noted at this time. Secondary to mental  status patient is unable to rate or describe her pain. 12/22/16; patient continues to make nice progress. The area on the right lateral calcaneus as improved and dimensions quite a bit since the last time I saw this, the area on the left is more substantial but also seems to be making improvement. No debridement was required in any area 01/05/17; the patient came in with the right lateral calcaneus covered an eschar. After removal there is still a small open area here. The area on the left still required debridement of circumferential senescent edges of tissue and nonviable surface material. We've been using Hydrofera Blue and making progress albeit slowly 01/19/17; patient has skilled facility I've been following for substantial right and left calcaneus pressure ulcers. We've been using Hydrofera Blue. The one on the right appears to be closed today. Left down in dimensions 02/02/17; the area on the right lateral heel remains closed. Still be open wound on the left. Dimensions down however not as much as I would like. Using Hydrofera Blue 02/16/17; 2 week follow-up for wound remaining on the right lateral heel. We have been using Hydrofera Blue. The patient states she feels well and has no complaints 03/01/17; 2 week follow-up for the wound remaining on her right lateral heel which looks quite good today we've been using Hydrofera Blue as the primary dressing. Our intake nurse reports that the patient is somewhat listless and not nearly as steady on her feet as usual. This information is seconded by the patient's daughter who is present. The patient is at a nursing home. In discussing things with her daughter she apparently has  a history of psychosis predating some dementia. She is on Risperdal 3 mg a day. Per the daughter she has not had any recent psychosis. The patient denies fever, chills, sore throat, shortness of breath, diarrhea or dysuria.. She is not had any headaches but states her balance  is poor Objective Constitutional Sitting or standing Blood Pressure is within target range for patient.. Pulse regular and within target range for patient.Marland Kitchen. Respirations regular, non-labored and within target range.. Temperature is normal and within the target range for the patient.. she has no facial expression tends to lean to the right.. Vitals Time Taken: 1:36 PM, Height: 66 in, Weight: 140.8 lbs, BMI: 22.7, Temperature: 96.1 F, Pulse: 63 bpm, Respiratory Rate: 14 breaths/min, Blood Pressure: 104/46 mmHg. Eyes Conjunctivae clear. No discharge. Respiratory Respiratory effort is easy and symmetric bilaterally. Rate is normal at rest and on room air.. Bilateral breath sounds are clear and equal in all lobes with no wheezes, rales or rhonchi.. Cardiovascular Heart rhythm and rate regular, without murmur or gallop.. Gastrointestinal (GI) Abdomen is soft and non-distended without masses or tenderness. Bowel sounds active in all quadrants.. No liver or spleen enlargement or tenderness.. Genitourinary (GU) Bladder without fullness, masses or tenderness.. Neurological [name], [name] ([id number]) mild episodic tremor. Increased tone with cogwheeling. Psychiatric No evidence of depression, anxiety, or agitation. Calm, cooperative, and communicative. Appropriate interactions and affect.. General Notes: wound exam; the area on the left lateral heel continues to close down nicely. It appears to have healthy granulation and advancing epithelialization.she had a mirror image location on the right lateral heel which is also closed Integumentary (Hair, Skin) no rash. Wound #2 status is Open. Original cause of wound was Pressure Injury. The wound is located on the Left Calcaneus. The wound measures 0.6cm length x 1.1cm width x 0.1cm depth; 0.518cm^2 area and 0.052cm^3 volume. The wound is limited to skin breakdown. There is no tunneling or undermining noted. There is a large amount of serous  drainage noted. Foul odor after cleansing was noted. The wound margin is flat and intact. There is large (67-100%) pink granulation within the wound bed. There is a small (1-33%) amount of necrotic tissue within the wound bed including Eschar and Adherent Slough. The periwound skin appearance did not exhibit: Callus, Crepitus, Excoriation, Induration, Rash, Scarring, Dry/Scaly, Maceration, Atrophie Blanche, Cyanosis, Ecchymosis, Hemosiderin Staining, Mottled, Pallor, Rubor, Erythema. Periwound temperature was noted as No Abnormality. The periwound has tenderness on palpation. 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 May Shower, gently pat wound dry prior to applying new dressing. Anesthetic: Wound #2 Left Calcaneus: Topical Lidocaine 4% cream applied to wound bed prior to debridement Primary Wound Dressing: Wound #2 Left Calcaneus: Hydrafera Blue Secondary Dressing: Wound #2 Left Calcaneus: Dry Gauze Conform/Kerlix Foam - heel cups Other - stretch netting #4 (please order for pt...daughter is aware) Dressing Change Frequency: Bowring, Curtis ([id number]) Wound #2 Left Calcaneus: Change dressing every day. Follow-up Appointments: Wound #2 Left Calcaneus: Return Appointment in 2 weeks. Edema Control: Wound #2 Left Calcaneus: Elevate legs to the level of the heart and pump ankles as often as possible Off-Loading: Wound #2 Left Calcaneus: Turn and reposition every 2 hours Other: - wear heel protector boots at al times in bed, float heels while in bed, Additional  Orders / Instructions: Wound #2 Left Calcaneus: Increase protein intake. Other: - please add vitamin A, vitamin C and zinc supplements to your diet #1 we are  continuing with Hydrofera Blue, heel cups and Kerlix #2 changing every second day in the facility #3 I don't think this woman is medically ill it appears to me that she is parkinsonian. Looking at her medication list she is on Risperdal 3 mg a day and I suspect this is the culprit. What needs to happen here is that the Risperdal needs to be tapered perhaps to 2 mg a day with serial follow-up of her mental status including any overt psychosis etc. If not then I think this can be tapered further. Electronic Signature(s) Signed: 03/02/2017 8:07:20 AM By: Baltazar Najjar MD Entered By: Baltazar Najjar on 03/01/2017 14:25:51 [name], [name] -------------------------------------------------------------------------------- SuperBill Details Patient Name: [name] Date of Service: 03/01/2017 Medical Record Number: [id number] Patient Account Number: [id number] Date of Birth/Sex: 09-20-1935 (80 y.o. 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

## 2017-03-04 NOTE — Progress Notes (Signed)
[name] [name] ([id number]) Visit Report for 03/01/2017 Arrival Information Details Patient Name: [name] LoboCHEELY, [name] Date of Service: 03/01/2017 1:30 PM Medical Record Number: [id number] Patient Account Number: [id number] Date of Birth/Sex: 05-25-1935 (81 y.o. Female) Treating RN: Curtis Sitesorthy, Joanna Primary Care Mattison Stuckey: Duncan Dullullo, Teresa Other Clinician: Referring Lonzell Dorris: Duncan Dullullo, Teresa Treating Cruise Baumgardner/Extender: Altamese CarolinaOBSON, [name] G Weeks in Treatment: 6527 Visit Information History Since Last Visit Added or deleted any medications: No Patient Arrived: Walker Any new allergies or adverse reactions: No Arrival Time: 13:23 Had a fall or experienced change in No Accompanied By: self activities of daily living that may affect Transfer Assistance: None risk of falls: Patient Identification Verified: Yes Signs or symptoms of abuse/neglect since last visito No Secondary Verification Process Completed: Yes Hospitalized since last visit: No Patient Requires Transmission-Based Precautions: No Has Dressing in Place as Prescribed: Yes Patient Has Alerts: Yes Pain Present Now: No Patient Alerts: DM II Electronic Signature(s) Signed: 03/01/2017 4:46:00 PM By: Curtis Sitesorthy, Joanna Entered By: Curtis Sitesorthy, Joanna on 03/01/2017 13:26:38 [name] ([id number]) -------------------------------------------------------------------------------- Clinic Level of Care Assessment Details Patient Name: [name] [name] Date of Service: 03/01/2017 1:30 PM Medical Record Number: [id number] Patient Account Number: [id number] Date of Birth/Sex: 05-25-1935 (80 y.o. Female) Treating RN: Curtis Sitesorthy, Joanna Primary Care Kenzleigh Sedam: Duncan Dullullo, Teresa Other Clinician: Referring Veronnica Hennings: Duncan Dullullo, Teresa Treating Mate Alegria/Extender: Altamese CarolinaOBSON, [name] G Weeks in Treatment: 27 Clinic Level of Care Assessment Items TOOL 4 Quantity Score []  - Use when only an EandM is performed on FOLLOW-UP visit 0 ASSESSMENTS - Nursing Assessment / Reassessment X -  Reassessment of Co-morbidities (includes updates in patient status) 1 10 X- 1 5 Reassessment of Adherence to Treatment Plan ASSESSMENTS - Wound and Skin Assessment / Reassessment X - Simple Wound Assessment / Reassessment - one wound 1 5 []  - 0 Complex Wound Assessment / Reassessment - multiple wounds []  - 0 Dermatologic / Skin Assessment (not related to wound area) ASSESSMENTS - Focused Assessment []  - Circumferential Edema Measurements - multi extremities 0 []  - 0 Nutritional Assessment / Counseling / Intervention X- 1 5 Lower Extremity Assessment (monofilament, tuning fork, pulses) []  - 0 Peripheral Arterial Disease Assessment (using hand held doppler) ASSESSMENTS - Ostomy and/or Continence Assessment and Care []  - Incontinence Assessment and Management 0 []  - 0 Ostomy Care Assessment and Management (repouching, etc.) PROCESS - Coordination of Care X - Simple Patient / Family Education for ongoing care 1 15 []  - 0 Complex (extensive) Patient / Family Education for ongoing care []  - 0 Staff obtains ChiropractorConsents, Records, Test Results / Process Orders []  - 0 Staff telephones HHA, Nursing Homes / Clarify orders / etc []  - 0 Routine Transfer to another Facility (non-emergent condition) []  - 0 Routine Hospital Admission (non-emergent condition) []  - 0 New Admissions / Manufacturing engineernsurance Authorizations / Ordering NPWT, Apligraf, etc. []  - 0 Emergency Hospital Admission (emergent condition) X- 1 10 Simple Discharge Coordination [name] [name] [name] ([id number]) []  - 0 Complex (extensive) Discharge Coordination PROCESS - Special Needs []  - Pediatric / Minor Patient Management 0 []  - 0 Isolation Patient Management []  - 0 Hearing / Language / Visual special needs []  - 0 Assessment of Community assistance (transportation, D/C planning, etc.) []  - 0 Additional assistance / Altered mentation []  - 0 Support Surface(s) Assessment (bed, cushion, seat, etc.) INTERVENTIONS - Wound Cleansing /  Measurement X - Simple Wound Cleansing - one wound 1 5 []  - 0 Complex Wound Cleansing - multiple wounds X- 1 5 Wound Imaging (photographs - any number of wounds) []  - 0 Wound Tracing (  instead of photographs) X- 1 5 Simple Wound Measurement - one wound []  - 0 Complex Wound Measurement - multiple wounds INTERVENTIONS - Wound Dressings []  - Small Wound Dressing one or multiple wounds 0 X- 1 15 Medium Wound Dressing one or multiple wounds []  - 0 Large Wound Dressing one or multiple wounds []  - 0 Application of Medications - topical []  - 0 Application of Medications - injection INTERVENTIONS - Miscellaneous []  - External ear exam 0 []  - 0 Specimen Collection (cultures, biopsies, blood, body fluids, etc.) []  - 0 Specimen(s) / Culture(s) sent or taken to Lab for analysis []  - 0 Patient Transfer (multiple staff / Nurse, adult / Similar devices) []  - 0 Simple Staple / Suture removal (25 or less) []  - 0 Complex Staple / Suture removal (26 or more) []  - 0 Hypo / Hyperglycemic Management (close monitor of Blood Glucose) []  - 0 Ankle / Brachial Index (ABI) - do not check if billed separately X- 1 5 Vital Signs [name] Has the patient been seen at the hospital within the last three years: Yes Total Score: 85 Level Of Care: New/Established - Level 3 Electronic Signature(s) Signed: 03/01/2017 4:46:00 PM By: Curtis Sites Entered By: Curtis Sites on 03/01/2017 16:27:44 [name] ([id number]) -------------------------------------------------------------------------------- Encounter Discharge Information Details Patient Name: [name] Date of Service: 03/01/2017 1:30 PM Medical Record Number: [id number] Patient Account Number: [id number] Date of Birth/Sex: [birth year] (80 y.o. Female) Treating RN: Curtis Sites Primary Care Kaari Zeigler: Duncan Dull Other Clinician: Referring Cherylee Rawlinson: Duncan Dull Treating Lelia Jons/Extender: Altamese [place] in  Treatment: 51 Encounter Discharge Information Items Discharge Pain Level: 0 Discharge Condition: Stable Ambulatory Status: Walker Discharge Destination: Home Transportation: Private Auto Accompanied By: dtr Schedule Follow-up Appointment: Yes Medication Reconciliation completed and No provided to Patient/Care Tanee Henery: Provided on Clinical Summary of Care: 03/01/2017 Form Type Recipient Paper Patient [facility] Electronic Signature(s) Signed: 03/03/2017 10:05:11 AM By: Gwenlyn Perking Entered By: Gwenlyn Perking on 03/01/2017 14:01:59 [name] ([id number]) -------------------------------------------------------------------------------- Lower Extremity Assessment Details Patient Name: [name] Date of Service: 03/01/2017 1:30 PM Medical Record Number: [id number] Patient Account Number: [id number] Date of Birth/Sex: May 08, 1935 (80 y.o. Female) Treating RN: Curtis Sites Primary Care Hester Joslin: Duncan Dull Other Clinician: Referring Lovey Crupi: Duncan Dull Treating Hasna Stefanik/Extender: Altamese [place] in Treatment: 27 Vascular Assessment Pulses: Dorsalis Pedis Palpable: [Left:Yes] Posterior Tibial Extremity colors, hair growth, and conditions: Extremity Color: [Left:Hyperpigmented] Hair Growth on Extremity: [Left:No] Temperature of Extremity: [Left:Warm] Capillary Refill: [Left:< 3 seconds] Electronic Signature(s) Signed: 03/01/2017 4:46:00 PM By: Curtis Sites Entered By: Curtis Sites on 03/01/2017 13:46:56 [name] ([id number]) -------------------------------------------------------------------------------- Multi Wound Chart Details Patient Name: [name] Date of Service: 03/01/2017 1:30 PM Medical Record Number: [id number] Patient Account Number: [id number] Date of Birth/Sex: [birth year] (80 y.o. Female) Treating RN: Curtis Sites Primary Care Brittini Brubeck: Duncan Dull Other Clinician: Referring [place] Name: Duncan Dull Treating Shalise Rosado/Extender:  Altamese [place] in Treatment: 27 Vital Signs Height(in): 66 Pulse(bpm): 63 Weight(lbs): 140.8 Blood Pressure(mmHg): 104/46 Body Mass Index(BMI): 23 Temperature(F): 96.1 Respiratory Rate 14 (breaths/min): Photos: [2:No Photos] [N/A:N/A] Wound Location: [2:Left Calcaneus] [N/A:N/A] Wounding Event: [2:Pressure Injury] [N/A:N/A] Primary Etiology: [2:Pressure Ulcer] [N/A:N/A] Comorbid History: [2:Cataracts, Anemia, Congestive Heart Failure, Hypertension, Type II Diabetes, Gout, Dementia, Neuropathy] [N/A:N/A] Date Acquired: [2:07/05/2016] [N/A:N/A] Weeks of Treatment: [2:27] [N/A:N/A] Wound Status: [2:Open] [N/A:N/A] Measurements L x W x D [2:0.6x1.1x0.1] [N/A:N/A] (cm) Area (cm) : [2:0.518] [N/A:N/A] Volume (cm) : [2:0.052] [N/A:N/A] % Reduction in Area: [2:95.30%] [N/A:N/A] % Reduction in Volume: [2:97.60%] [N/A:N/A] Classification: [  2:Category/Stage III] [N/A:N/A] Exudate Amount: [2:Large] [N/A:N/A] Exudate Type: [2:Serous] [N/A:N/A] Exudate Color: [2:amber] [N/A:N/A] Foul Odor After Cleansing: [2:Yes] [N/A:N/A] Odor Anticipated Due to [2:No] [N/A:N/A] Product Use: Wound Margin: [2:Flat and Intact] [N/A:N/A] Granulation Amount: [2:Large (67-100%)] [N/A:N/A] Granulation Quality: [2:Pink] [N/A:N/A] Necrotic Amount: [2:Small (1-33%)] [N/A:N/A] Necrotic Tissue: [2:Eschar, Adherent Slough] [N/A:N/A] Exposed Structures: [2:Fascia: No Fat Layer (Subcutaneous Tissue) Exposed: No Tendon: No Muscle: No Joint: No] [N/A:N/A] Bone: No Limited to Skin Breakdown Epithelialization: None N/A N/A Periwound Skin Texture: Excoriation: No N/A N/A Induration: No Callus: No Crepitus: No Rash: No Scarring: No Periwound Skin Moisture: Maceration: No N/A N/A Dry/Scaly: No Periwound Skin Color: Atrophie Blanche: No N/A N/A Cyanosis: No Ecchymosis: No Erythema: No Hemosiderin Staining: No Mottled: No Pallor: No Rubor: No Temperature: No Abnormality N/A N/A Tenderness on  Palpation: Yes N/A N/A Wound Preparation: Ulcer Cleansing: N/A N/A Rinsed/Irrigated with Saline Topical Anesthetic Applied: Other: lidocaine 4% Treatment Notes Electronic Signature(s) Signed: 03/02/2017 8:07:20 AM By: Baltazar Najjar MD Entered By: Baltazar Najjar on 03/01/2017 14:17:40 Benegas, Negar ([id number]) -------------------------------------------------------------------------------- Multi-Disciplinary Care Plan Details Patient Name: [name] Date of Service: 03/01/2017 1:30 PM Medical Record Number: [id number] Patient Account Number: [id number] Date of Birth/Sex: Jan 11, 1936 (80 y.o. Female) Treating RN: Curtis Sites Primary Care Johnnye Sandford: Duncan Dull Other Clinician: Referring Blimi Godby: Duncan Dull Treating Jashawna Reever/Extender: Altamese [place] in Treatment: 63 Active Inactive ` Abuse / Safety / Falls / Self Care Management Nursing Diagnoses: Impaired physical mobility Potential for falls Goals: Patient will remain injury free Date Initiated: 08/18/2016 Target Resolution Date: 10/29/2016 Goal Status: Active Interventions: Assess fall risk on admission and as needed Notes: ` Nutrition Nursing Diagnoses: Potential for alteratiion in Nutrition/Potential for imbalanced nutrition Goals: Patient/caregiver agrees to and verbalizes understanding of need to use nutritional supplements and/or vitamins as prescribed Date Initiated: 08/18/2016 Target Resolution Date: 10/29/2016 Goal Status: Active Interventions: Assess patient nutrition upon admission and as needed per policy Notes: ` Orientation to the Wound Care Program Nursing Diagnoses: Knowledge deficit related to the wound healing center program Goals: Patient/caregiver will verbalize understanding of the Wound Healing Center Program Date Initiated: 08/18/2016 Target Resolution Date: 10/29/2016 Goal Status: Active [name] ([id number]) Interventions: Provide education on orientation to the  wound center Notes: ` Wound/Skin Impairment Nursing Diagnoses: Impaired tissue integrity Goals: Patient/caregiver will verbalize understanding of skin care regimen Date Initiated: 08/18/2016 Target Resolution Date: 10/29/2016 Goal Status: Active Ulcer/skin breakdown will have a volume reduction of 30% by week 4 Date Initiated: 08/18/2016 Target Resolution Date: 10/29/2016 Goal Status: Active Ulcer/skin breakdown will have a volume reduction of 50% by week 8 Date Initiated: 08/18/2016 Target Resolution Date: 10/29/2016 Goal Status: Active Ulcer/skin breakdown will have a volume reduction of 80% by week 12 Date Initiated: 08/18/2016 Target Resolution Date: 10/29/2016 Goal Status: Active Ulcer/skin breakdown will heal within 14 weeks Date Initiated: 08/18/2016 Target Resolution Date: 10/29/2016 Goal Status: Active Interventions: Assess patient/caregiver ability to obtain necessary supplies Assess patient/caregiver ability to perform ulcer/skin care regimen upon admission and as needed Assess ulceration(s) every visit Notes: Electronic Signature(s) Signed: 03/01/2017 4:46:00 PM By: Curtis Sites Entered By: Curtis Sites on 03/01/2017 13:47:04 [name] ([id number]) -------------------------------------------------------------------------------- Pain Assessment Details Patient Name: [name] Date of Service: 03/01/2017 1:30 PM Medical Record Number: [id number] Patient Account Number: [id number] Date of Birth/Sex: 10/29/1935 (80 y.o. Female) Treating RN: Curtis Sites Primary Care Teagan Ozawa: Duncan Dull Other Clinician: Referring Erik Nessel: Duncan Dull Treating Darryon Bastin/Extender: Altamese  in Treatment: 27 Active Problems Location of Pain Severity and Description  of Pain Patient Has Paino No Site Locations Pain Management and Medication Current Pain Management: Notes Topical or injectable lidocaine is offered to patient for acute pain when surgical  debridement is performed. If needed, Patient is instructed to use over the counter pain medication for the following 24-48 hours after debridement. Wound care MDs do not prescribed pain medications. Patient has chronic pain or uncontrolled pain. Patient has been instructed to make an appointment with their Primary Care Physician for pain management. Electronic Signature(s) Signed: 03/01/2017 4:46:00 PM By: Curtis Sites Entered By: Curtis Sites on 03/01/2017 13:26:51 [name] -------------------------------------------------------------------------------- Patient/Caregiver Education Details Patient Name: [name] Date of Service: 03/01/2017 1:30 PM Medical Record Number: [id number] Patient Account Number: [id number] Date of Birth/Gender: 10/07/1935 (80 y.o. Female) Treating RN: Curtis Sites Primary Care Physician: Duncan Dull Other Clinician: Referring Physician: Duncan Dull Treating Physician/Extender: Altamese [place] in Treatment: 72 Education Assessment Education Provided To: Caregiver SNF nurses Education Topics Provided Wound/Skin Impairment: Handouts: Other: wound care as ordered Methods: Clinical cytogeneticist) Signed: 03/01/2017 4:46:00 PM By: Curtis Sites Entered By: Curtis Sites on 03/01/2017 13:48:31 [name] ([id number]) -------------------------------------------------------------------------------- Wound Assessment Details Patient Name: [name] Date of Service: 03/01/2017 1:30 PM Medical Record Number: [id number] Patient Account Number: [id number] Date of Birth/Sex: [birth year] (80 y.o. Female) Treating RN: Curtis Sites Primary Care Erial Fikes: Duncan Dull Other Clinician: Referring Allannah Kempen: Duncan Dull Treating Wilhelm Ganaway/Extender: Altamese [place] in Treatment: 27 Wound Status Wound Number: 2 Primary Pressure Ulcer Etiology: Wound Location: Left Calcaneus Wound Open Wounding Event:  Pressure Injury Status: Date Acquired: 07/05/2016 Comorbid Cataracts, Anemia, Congestive Heart Failure, Weeks Of Treatment: 27 History: Hypertension, Type II Diabetes, Gout, Clustered Wound: No Dementia, Neuropathy Photos Photo Uploaded By: Curtis Sites on 03/01/2017 16:36:28 Wound Measurements Length: (cm) 0.6 Width: (cm) 1.1 Depth: (cm) 0.1 Area: (cm) 0.518 Volume: (cm) 0.052 % Reduction in Area: 95.3% % Reduction in Volume: 97.6% Epithelialization: None Tunneling: No Undermining: No Wound Description Classification: Category/Stage III Wound Margin: Flat and Intact Exudate Amount: Large Exudate Type: Serous Exudate Color: amber Foul Odor After Cleansing: Yes Due to Product Use: No Slough/Fibrino No Wound Bed Granulation Amount: Large (67-100%) Exposed Structure Granulation Quality: Pink Fascia Exposed: No Necrotic Amount: Small (1-33%) Fat Layer (Subcutaneous Tissue) Exposed: No Necrotic Quality: Eschar, Adherent Slough Tendon Exposed: No Muscle Exposed: No Joint Exposed: No Bone Exposed: No Limited to Skin Breakdown [name] ([id number]) Periwound Skin Texture Texture Color No Abnormalities Noted: No No Abnormalities Noted: No Callus: No Atrophie Blanche: No Crepitus: No Cyanosis: No Excoriation: No Ecchymosis: No Induration: No Erythema: No Rash: No Hemosiderin Staining: No Scarring: No Mottled: No Pallor: No Moisture Rubor: No No Abnormalities Noted: No Dry / Scaly: No Temperature / Pain Maceration: No Temperature: No Abnormality Tenderness on Palpation: Yes Wound Preparation Ulcer Cleansing: Rinsed/Irrigated with Saline Topical Anesthetic Applied: Other: lidocaine 4%, Treatment Notes Wound #2 (Left Calcaneus) 1. Cleansed with: Clean wound with Normal Saline 2. Anesthetic Topical Lidocaine 4% cream to wound bed prior to debridement 4. Dressing Applied: Hydrafera Blue 5. Secondary Dressing Applied Foam Gauze and  Kerlix/Conform 7. Secured with Tape Other (specify in notes) Notes heel cup, netting Electronic Signature(s) Signed: 03/01/2017 4:46:00 PM By: Curtis Sites Entered By: Curtis Sites on 03/01/2017 13:46:37 [name] ([id number]) -------------------------------------------------------------------------------- Vitals Details Patient Name: [name] Date of Service: 03/01/2017 1:30 PM Medical Record Number: [id number] Patient Account Number: [id number] Date of Birth/Sex: 07/18/1935 (80 y.o. Female) Treating RN: Curtis Sites Primary Care Shelby Peltz: Duncan Dull Other  Clinician: Referring Mima Cranmore: Duncan Dullullo, Teresa Treating Winda Summerall/Extender: Altamese CarolinaOBSON, [name] G Weeks in Treatment: 27 Vital Signs Time Taken: 13:36 Temperature (F): 96.1 Height (in): 66 Pulse (bpm): 63 Weight (lbs): 140.8 Respiratory Rate (breaths/min): 14 Body Mass Index (BMI): 22.7 Blood Pressure (mmHg): 104/46 Reference Range: 80 - 120 mg / dl Electronic Signature(s) Signed: 03/01/2017 4:46:00 PM By: Curtis Sitesorthy, Joanna Entered By: Curtis Sitesorthy, Joanna on 03/01/2017 13:36:36

## 2017-03-15 ENCOUNTER — Encounter: Payer: Medicare (Managed Care) | Admitting: Internal Medicine

## 2017-03-15 DIAGNOSIS — E11621 Type 2 diabetes mellitus with foot ulcer: Secondary | ICD-10-CM | POA: Diagnosis not present

## 2017-03-17 ENCOUNTER — Emergency Department
Admission: EM | Admit: 2017-03-17 | Discharge: 2017-03-17 | Disposition: A | Discharge status: Home / Self Care | Payer: Medicare (Managed Care) | Attending: Emergency Medicine | Admitting: Emergency Medicine

## 2017-03-17 DIAGNOSIS — G308 Other Alzheimer's disease: Secondary | ICD-10-CM | POA: Diagnosis not present

## 2017-03-17 DIAGNOSIS — I509 Heart failure, unspecified: Secondary | ICD-10-CM | POA: Insufficient documentation

## 2017-03-17 DIAGNOSIS — F0281 Dementia in other diseases classified elsewhere with behavioral disturbance: Secondary | ICD-10-CM | POA: Insufficient documentation

## 2017-03-17 DIAGNOSIS — Z7982 Long term (current) use of aspirin: Secondary | ICD-10-CM | POA: Diagnosis not present

## 2017-03-17 DIAGNOSIS — I130 Hypertensive heart and chronic kidney disease with heart failure and stage 1 through stage 4 chronic kidney disease, or unspecified chronic kidney disease: Secondary | ICD-10-CM | POA: Diagnosis not present

## 2017-03-17 DIAGNOSIS — N183 Chronic kidney disease, stage 3 (moderate): Secondary | ICD-10-CM | POA: Diagnosis not present

## 2017-03-17 DIAGNOSIS — Z79899 Other long term (current) drug therapy: Secondary | ICD-10-CM | POA: Insufficient documentation

## 2017-03-17 DIAGNOSIS — E1122 Type 2 diabetes mellitus with diabetic chronic kidney disease: Secondary | ICD-10-CM | POA: Insufficient documentation

## 2017-03-17 DIAGNOSIS — G20 Parkinson's disease: Secondary | ICD-10-CM | POA: Diagnosis not present

## 2017-03-17 DIAGNOSIS — E039 Hypothyroidism, unspecified: Secondary | ICD-10-CM | POA: Insufficient documentation

## 2017-03-17 DIAGNOSIS — R443 Hallucinations, unspecified: Secondary | ICD-10-CM | POA: Insufficient documentation

## 2017-03-17 DIAGNOSIS — R4182 Altered mental status, unspecified: Secondary | ICD-10-CM | POA: Diagnosis present

## 2017-03-17 DIAGNOSIS — Z87891 Personal history of nicotine dependence: Secondary | ICD-10-CM | POA: Diagnosis not present

## 2017-03-17 LAB — CBC WITH DIFFERENTIAL/PLATELET
BASOS ABS: 0 10*3/uL (ref 0–0.1)
BASOS PCT: 1 %
Eosinophils Absolute: 0 10*3/uL (ref 0–0.7)
Eosinophils Relative: 2 %
HEMATOCRIT: 31.6 % — AB (ref 35.0–47.0)
HEMOGLOBIN: 11 g/dL — AB (ref 12.0–16.0)
Lymphocytes Relative: 35 %
Lymphs Abs: 1 10*3/uL (ref 1.0–3.6)
MCH: 34.4 pg — ABNORMAL HIGH (ref 26.0–34.0)
MCHC: 34.6 g/dL (ref 32.0–36.0)
MCV: 99.5 fL (ref 80.0–100.0)
MONOS PCT: 10 %
Monocytes Absolute: 0.3 10*3/uL (ref 0.2–0.9)
NEUTROS ABS: 1.5 10*3/uL (ref 1.4–6.5)
NEUTROS PCT: 52 %
Platelets: 156 10*3/uL (ref 150–440)
RBC: 3.18 MIL/uL — AB (ref 3.80–5.20)
RDW: 13.9 % (ref 11.5–14.5)
WBC: 2.8 10*3/uL — AB (ref 3.6–11.0)

## 2017-03-17 LAB — BASIC METABOLIC PANEL
Anion gap: 10 (ref 5–15)
BUN: 64 mg/dL — AB (ref 6–20)
CO2: 27 mmol/L (ref 22–32)
CREATININE: 2.57 mg/dL — AB (ref 0.44–1.00)
Calcium: 10 mg/dL (ref 8.9–10.3)
Chloride: 106 mmol/L (ref 101–111)
GFR calc Af Amer: 19 mL/min — ABNORMAL LOW (ref >60)
GFR, EST NON AFRICAN AMERICAN: 17 mL/min — AB (ref >60)
Glucose, Bld: 100 mg/dL — ABNORMAL HIGH (ref 65–99)
Potassium: 4.6 mmol/L (ref 3.5–5.1)
SODIUM: 143 mmol/L (ref 135–145)

## 2017-03-17 LAB — TROPONIN I

## 2017-03-17 LAB — URINALYSIS, COMPLETE (UACMP) WITH MICROSCOPIC
Bacteria, UA: NONE SEEN
Bilirubin Urine: NEGATIVE
GLUCOSE, UA: NEGATIVE mg/dL
Hgb urine dipstick: NEGATIVE
Ketones, ur: NEGATIVE mg/dL
Leukocytes, UA: NEGATIVE
Nitrite: NEGATIVE
PH: 5 (ref 5.0–8.0)
PROTEIN: NEGATIVE mg/dL
SQUAMOUS EPITHELIAL / LPF: NONE SEEN
Specific Gravity, Urine: 1.012 (ref 1.005–1.030)

## 2017-03-17 MED ORDER — SODIUM CHLORIDE 0.9 % IV BOLUS (SEPSIS): 1000.0000 mL | Freq: Once | INTRAVENOUS | Status: DC

## 2017-03-17 MED ORDER — RISPERIDONE 1 MG PO TABS
3.0000 mg | ORAL_TABLET | Freq: Once | ORAL | Status: DC
  Filled 2017-03-17 (×2): qty 3

## 2017-03-17 MED ORDER — SODIUM CHLORIDE 0.9 % IV SOLN
Freq: Once | INTRAVENOUS | Status: AC
  Administered 2017-03-17: 12:00:00 via INTRAVENOUS

## 2017-03-17 NOTE — ED Notes (Signed)
Pt's daughter called and updated on plan of care for patient to be discharged back to [facility] with medication changes and recommendation to follow up with PCP for evaluation of medication adjustment.

## 2017-03-17 NOTE — ED Notes (Signed)
Pt repositioned. Pt placed into a clean brief and back onto the monitor at this time. Pt noted to continue to ask this RN if she had ever killed anyone and continues to state that we are trying to kill her.

## 2017-03-17 NOTE — ED Provider Notes (Signed)
[facility] Emergency Department Provider Note   ____________________________________________   I have reviewed the triage vital signs and the nursing notes.   HISTORY  Chief Complaint Altered Mental Status   History limited by and level 5 caveat due to: Altered Mental Status   HPI [name] is a 81 y.o. female who presents to the emergency department today from living facility via EMS because of altered mental status  DURATION:noticed today TIMING: constant CONTEXT: patient comes from living facility. Apparently this morning they noticed a change in behavior. Patient was refusing medicine. Staff is concerned for a UTI MODIFYING FACTORS: none ASSOCIATED SYMPTOMS: patient denies any pain  Per medical record review patient has a history of dementia, UTIs.  Past Medical History:  Diagnosis Date  . Anemia   . Arthritis    osteo  . CHF (congestive heart failure) (HCC)   . Diabetes mellitus   . Dysrhythmia   . GERD (gastroesophageal reflux disease)   . Heart murmur   . Hemorrhoids   . History of hiatal hernia   . Hypertension   . Hypertension   . Hypothyroidism   . Neuropathy   . Parkinson disease (HCC)   . Peripheral vascular disease (HCC)   . Phlebitis    history of in legs  . Renal disorder   . Ulcer of foot (HCC)    left foot    Patient Active Problem List   Diagnosis Date Noted  . Pressure injury of skin 05/14/2016  . UTI (urinary tract infection) 05/12/2016  . Pancytopenia (HCC) 09/25/2015  . Sepsis (HCC) 09/25/2015  . Hypothermia 09/25/2015  . Hypokalemia 09/25/2015  . CKD (chronic kidney disease), stage III (HCC) 09/25/2015  . Pressure ulcer 09/24/2015  . Rhabdomyolysis 07/24/2015  . Encephalopathy, metabolic 07/18/2015  . Hospital discharge follow-up 06/21/2015  . Mixed Alzheimer's and vascular dementia 06/02/2015  . Schizoaffective disorder, bipolar type (HCC) 05/17/2015  . Psychosis (HCC) 05/16/2015  . CKD (chronic  kidney disease) stage 3, GFR 30-59 ml/min (HCC) 05/03/2015  . Noncompliance with medication treatment due to overuse of medication 01/28/2015  . Hypothyroidism 01/28/2015  . Dysuria 08/07/2014  . Back pain of thoracolumbar region 09/23/2013  . Visit for preventive health examination 08/18/2013  . Obesity 07/07/2013  . Depression with anxiety 06/26/2013  . Hemorrhoids 11/06/2012  . Hidradenitis suppurativa 10/05/2012  . Venous insufficiency of leg 11/24/2011  . Type 2 diabetes, uncontrolled, with renal manifestation (HCC) 10/03/2011  . Anemia of chronic renal failure, stage 4 (severe) (HCC) 10/03/2011  . Chest pain with normal coronary angiography 04/21/2011  . Screening for colon cancer 04/12/2011  . Screening for breast cancer 04/12/2011  . Hypertension 04/12/2011  . Hyperlipidemia with target LDL less than 100 04/12/2011  . Screening for osteoporosis 04/12/2011    Past Surgical History:  Procedure Laterality Date  . ABDOMINAL HYSTERECTOMY    . ARTHROPLASTY W/ ARTHROSCOPY MEDIAL / LATERAL COMPARTMENT KNEE  Feb 2011   [name]  . BACK SURGERY     x2  . CATARACT EXTRACTION W/PHACO Left 02/10/2017   Procedure: CATARACT EXTRACTION PHACO AND INTRAOCULAR LENS PLACEMENT (IOC)-LEFT DIABETIC;  Surgeon: [place] CraneKing, Bradley Mark, MD;  Location: [facility] ORS;  Service: Ophthalmology;  Laterality: Left;  Lot: [id number] H US: 00:44.6 AP%: 9.4 CDE: 4.18  . CHOLECYSTECTOMY    . HEMORRHOID SURGERY  12/19/12    Prior to Admission medications   Medication Sig Start Date End Date Taking? Authorizing Provider  acetaminophen (TYLENOL) 650 MG CR tablet Take 650 mg by  mouth 2 (two) times daily as needed for pain.    [provider]  Ascorbic Acid (VITAMIN C PO) Take 1 tablet by mouth 2 (two) times daily.    [provider]  aspirin EC 81 MG tablet Take 81 mg by mouth daily.    [provider]  carvedilol (COREG) 12.5 MG tablet Take 12.5 mg by mouth 2 (two) times daily.    [provider]  cholecalciferol (VITAMIN D) 1000 units tablet Take 1,000 Units by mouth daily.    [provider]  cloNIDine (CATAPRES) 0.1 MG tablet Take 0.1 mg by mouth every 12 (twelve) hours as needed (for SBP>150 OR DBP>100).    [provider]  cloNIDine (CATAPRES) 0.1 MG tablet Take 0.1 mg by mouth 2 (two) times daily. HOLD FOR SYSTOLIC BP LESS THAN 120.    [provider]  diclofenac sodium (VOLTAREN) 1 % GEL Apply 2 g topically 3 (three) times daily. FOR RIGHT HAND PAIN.    [provider]  ferrous sulfate 325 (65 FE) MG tablet Take 325 mg by mouth 2 (two) times daily.    [provider]  furosemide (LASIX) 40 MG tablet Take 80 mg by mouth 2 (two) times daily.     [provider]  gabapentin (NEURONTIN) 100 MG capsule Take 100 mg by mouth at bedtime.    [provider]  lamoTRIgine (LAMICTAL) 25 MG tablet Take 25 mg by mouth 2 (two) times daily.     [provider]  levothyroxine (SYNTHROID, LEVOTHROID) 50 MCG tablet Take 1 tablet (50 mcg total) by mouth daily before breakfast. 06/23/15   Sherlene Shams, MD  loratadine (CLARITIN) 10 MG tablet Take 10 mg by mouth daily.    [provider]  memantine (NAMENDA) 5 MG tablet Take 5 mg by mouth 2 (two) times daily.     [provider]  omeprazole (PRILOSEC) 40 MG capsule Take 1 capsule (40 mg total) by mouth daily. 05/15/15 02/03/18  Rockne Menghini, MD  Polyethyl Glycol-Propyl Glycol (SYSTANE ULTRA) 0.4-0.3 % SOLN Place 1 drop into both eyes.    [provider]  potassium chloride SA (K-DUR,KLOR-CON) 20 MEQ tablet Take 20 mEq by mouth 2 (two) times daily. 01/20/17   [provider]  risperiDONE (RISPERDAL) 3 MG tablet Take 1 tablet (3 mg total) by mouth 2 (two) times daily. 09/25/15   Katharina Caper, MD  sennosides-docusate sodium (SENOKOT-S) 8.6-50 MG tablet Take 1 tablet by mouth daily.    [provider]  vitamin A 8000 UNIT  capsule Take 8,000 Units by mouth daily.    [provider]    Allergies Hydrocodone-acetaminophen and Morphine and related  Family History  Problem Relation Age of Onset  . Heart disease Mother   . Hypertension Mother   . Cancer Mother     Social History Social History   Tobacco Use  . Smoking status: Former Smoker    Years: 30.00    Types: Cigarettes    Last attempt to quit: 09/28/2001    Years since quitting: 15.4  . Smokeless tobacco: Current User    Types: Snuff  Substance Use Topics  . Alcohol use: No  . Drug use: No    Review of Systems Unable to obtain given AMS  ____________________________________________   PHYSICAL EXAM:  VITAL SIGNS: ED Triage Vitals [03/17/17 0935]  Enc Vitals Group     BP 109/56     Pulse 69     Resp  12     Temp      Temp src      SpO2      Weight 173 lb (78.5 kg)     Height 5\' 5"  (1.651 m)   Constitutional: Alert and awake, not completely oriented to events. Well appearing and in no distress. Eyes: Conjunctivae are normal.  ENT   Head: Normocephalic and atraumatic.   Nose: No congestion/rhinnorhea.   Mouth/Throat: Mucous membranes are moist.   Neck: No stridor. Hematological/Lymphatic/Immunilogical: No cervical lymphadenopathy. Cardiovascular: Normal rate, regular rhythm.  No murmurs, rubs, or gallops.  Respiratory: Normal respiratory effort without tachypnea nor retractions. Breath sounds are clear and equal bilaterally. No wheezes/rales/rhonchi. Gastrointestinal: Soft and non tender. No rebound. No guarding.  Genitourinary: Deferred Musculoskeletal: Normal range of motion in all extremities. No lower extremity edema. Neurologic:  Normal speech and language. No gross focal neurologic deficits are appreciated.  Skin:  Skin is warm, dry and intact. No rash noted. ____________________________________________    LABS (pertinent positives/negatives)  BMP cr 2.57, na 143 Trop <0.03 CBC wbc 2.8, hgb  11.0, plt 156 UA not consistent ____________________________________________   EKG  I, Phineas SemenGraydon Priscille Shadduck, attending physician, personally viewed and interpreted this EKG  EKG Time: 0937 Rate: 76 Rhythm: normal sinus rhythm with 1st degree av block Axis: normal Intervals: qtc 447 QRS: LVH, q waves v1, v2, v3 ST changes: no st elevation Impression: abnormal ekg   ____________________________________________    RADIOLOGY  None  ____________________________________________   PROCEDURES  Procedures  ____________________________________________   INITIAL IMPRESSION / ASSESSMENT AND PLAN / ED COURSE  Pertinent labs & imaging results that were available during my care of the patient were reviewed by me and considered in my medical decision making (see chart for details).  Patient sent from nursing facility for altered mental status. Differential diagnosis includes, but is not limited to, alcohol, illicit or prescription medications, or other toxic ingestion; intracranial pathology such as stroke or intracerebral hemorrhage; fever or infectious causes including sepsis; hypoxemia and/or hypercarbia; uremia; trauma; endocrine related disorders such as diabetes, hypoglycemia, and thyroid-related diseases; hypertensive encephalopathy; etc. Work up without any concerning findings. At this point wonder if underlying psych issue is driving the patient's symptoms. Discussed with daughter who would like psych eval.   ____________________________________________   FINAL CLINICAL IMPRESSION(S) / ED DIAGNOSES  Final diagnoses:  Hallucination   AMS  Note: This dictation was prepared with Dragon dictation. Any transcriptional errors that result from this process are unintentional     Phineas SemenGoodman, Cena Bruhn, MD 03/17/17 1457

## 2017-03-17 NOTE — ED Notes (Signed)
Pt removed herself from the monitor again at this time. Took off O2 sensor as well as BP cuff. Attempted to place patient back on monitor, patient however noted to be agitated, refuses to keep monitoring equipment on at this time.

## 2017-03-17 NOTE — ED Notes (Signed)
Amber, RN attempting to give patient Risperdal at this time.

## 2017-03-17 NOTE — ED Notes (Signed)
SOC attempted to speak with patient, [facility] Pt placed back on monitor, however patient noted to be fidgeting with equipment at this time.

## 2017-03-17 NOTE — ED Provider Notes (Signed)
Specialist on-call Dr. Maricela BoSprague makes the following recommendations.  Primary diagnostic category: Delirium Disposition recommendation: Discharge home with support Treatment and medication recommendations:  1.  Patient is psychiatrically cleared for return to nursing home 2.  Hold clonidine until patient is seen by primary care physician-patient is significantly hypotensive and this medication was apparently just initiated Discontinue trazodone-recently initiated and carry significant antihistamine and to a lesser degree anticholinergic side effects Unless risperidone was decreased secondary to medication side effects suggest returning risperidone to 3 mg twice a day  Commitment status: Patient does not meet criteria for involuntary commitment   Merrily Brittleifenbark, Tobi Groesbeck, MD 03/17/17 1931

## 2017-03-17 NOTE — ED Triage Notes (Signed)
[facility] with c/o "not acting like self" and "refusing medicines" this am. Pt states "they're trying to kill me" when asked what brought her in. Denies pain. A&Ox4 at this time.

## 2017-03-17 NOTE — ED Notes (Signed)
NAD noted at time of D/C. [facility] called and alerted that patient was on the way with EMS. No change in patient condition.

## 2017-03-17 NOTE — ED Notes (Signed)
No change in patient condition, pt remains confused, family reports patient continues to rest in bed "rambling on". Explained delay to family. VSS. Will continue to monitor for changes in patient condition.

## 2017-03-17 NOTE — Discharge Instructions (Addendum)
Medication recommendations: STOP CLONIDINE AROUND THE CLOCK.  PRN ONLY STOP XANAX STOP TRAZADONE CHANGE RISPERDAL to 3mg  by mouth twice a day  Follow up with your PMD this coming Monday for a recheck.  It was a pleasure to take care of you today, and thank you for coming to our emergency department.  If you have any questions or concerns before leaving please ask the nurse to grab me and I'm more than happy to go through your aftercare instructions again.  If you were prescribed any opioid pain medication today such as Norco, Vicodin, Percocet, morphine, hydrocodone, or oxycodone please make sure you do not drive when you are taking this medication as it can alter your ability to drive safely.  If you have any concerns once you are home that you are not improving or are in fact getting worse before you can make it to your follow-up appointment, please do not hesitate to call 911 and come back for further evaluation.  Merrily BrittleNeil Myriam Brandhorst, MD  Results for orders placed or performed during the hospital encounter of 03/17/17  CBC with Differential  Result Value Ref Range   WBC 2.8 (L) 3.6 - 11.0 K/uL   RBC 3.18 (L) 3.80 - 5.20 MIL/uL   Hemoglobin 11.0 (L) 12.0 - 16.0 g/dL   HCT 40.931.6 (L) 81.135.0 - 91.447.0 %   MCV 99.5 80.0 - 100.0 fL   MCH 34.4 (H) 26.0 - 34.0 pg   MCHC 34.6 32.0 - 36.0 g/dL   RDW 78.213.9 95.611.5 - 21.314.5 %   Platelets 156 150 - 440 K/uL   Neutrophils Relative % 52 %   Neutro Abs 1.5 1.4 - 6.5 K/uL   Lymphocytes Relative 35 %   Lymphs Abs 1.0 1.0 - 3.6 K/uL   Monocytes Relative 10 %   Monocytes Absolute 0.3 0.2 - 0.9 K/uL   Eosinophils Relative 2 %   Eosinophils Absolute 0.0 0 - 0.7 K/uL   Basophils Relative 1 %   Basophils Absolute 0.0 0 - 0.1 K/uL  Basic metabolic panel  Result Value Ref Range   Sodium 143 135 - 145 mmol/L   Potassium 4.6 3.5 - 5.1 mmol/L   Chloride 106 101 - 111 mmol/L   CO2 27 22 - 32 mmol/L   Glucose, Bld 100 (H) 65 - 99 mg/dL   BUN 64 (H) 6 - 20 mg/dL   Creatinine, Ser 0.862.57 (H) 0.44 - 1.00 mg/dL   Calcium 57.810.0 8.9 - 46.910.3 mg/dL   GFR calc non Af Amer 17 (L) >60 mL/min   GFR calc Af Amer 19 (L) >60 mL/min   Anion gap 10 5 - 15  Troponin I  Result Value Ref Range   Troponin I <0.03 <0.03 ng/mL  Urinalysis, Complete w Microscopic  Result Value Ref Range   Color, Urine YELLOW (A) YELLOW   APPearance CLEAR (A) CLEAR   Specific Gravity, Urine 1.012 1.005 - 1.030   pH 5.0 5.0 - 8.0   Glucose, UA NEGATIVE NEGATIVE mg/dL   Hgb urine dipstick NEGATIVE NEGATIVE   Bilirubin Urine NEGATIVE NEGATIVE   Ketones, ur NEGATIVE NEGATIVE mg/dL   Protein, ur NEGATIVE NEGATIVE mg/dL   Nitrite NEGATIVE NEGATIVE   Leukocytes, UA NEGATIVE NEGATIVE   RBC / HPF 0-5 0 - 5 RBC/hpf   WBC, UA 0-5 0 - 5 WBC/hpf   Bacteria, UA NONE SEEN NONE SEEN   Squamous Epithelial / LPF NONE SEEN NONE SEEN

## 2017-03-17 NOTE — ED Notes (Signed)
Unable to obtain E-sig due to patient condition. Pt's daughter called and notified that patient was taken back to [facility], no family at bedside to sign for D/C instructions.

## 2017-03-17 NOTE — ED Notes (Signed)
ED Provider at bedside. 

## 2017-03-17 NOTE — Progress Notes (Signed)
[name], Skyleen ([id number]) Visit Report for 03/15/2017 Arrival Information Details Patient Name: [name], [name] Date of Service: 03/15/2017 1:30 PM Medical Record Number: [id number] Patient Account Number: [id number] Date of Birth/Sex: 08/03/1935 (81 y.o. Female) Treating RN: Curtis Sitesorthy, Joanna Primary Care Bulah Lurie: Duncan Dullullo, Teresa Other Clinician: Referring Kiyla Ringler: Duncan Dullullo, Teresa Treating Arhianna Ebey/Extender: Altamese CarolinaOBSON, [name] G Weeks in Treatment: 29 Visit Information History Since Last Visit Added or deleted any medications: No Patient Arrived: Walker Any new allergies or adverse reactions: No Arrival Time: 13:29 Had a fall or experienced change in No Accompanied By: dtr activities of daily living that may affect Transfer Assistance: None risk of falls: Patient Identification Verified: Yes Signs or symptoms of abuse/neglect since last visito No Secondary Verification Process Completed: Yes Hospitalized since last visit: No Patient Requires Transmission-Based Precautions: No Has Dressing in Place as Prescribed: Yes Patient Has Alerts: Yes Pain Present Now: No Patient Alerts: DM II Electronic Signature(s) Signed: 03/15/2017 4:51:47 PM By: Curtis Sitesorthy, Joanna Entered By: Curtis Sitesorthy, Joanna on 03/15/2017 13:29:58 Starkes, Chassie ([id number]) -------------------------------------------------------------------------------- Encounter Discharge Information Details Patient Name: [name], [name] Date of Service: 03/15/2017 1:30 PM Medical Record Number: [id number] Patient Account Number: [id number] Date of Birth/Sex: 08/03/1935 (81 y.o. Female) Treating RN: Curtis Sitesorthy, Joanna Primary Care Jewelia Bocchino: Duncan Dullullo, Teresa Other Clinician: Referring Lorree Millar: Duncan Dullullo, Teresa Treating Deardra Hinkley/Extender: Altamese CarolinaOBSON, [name] G Weeks in Treatment: 7329 Encounter Discharge Information Items Discharge Pain Level: 0 Discharge Condition: Stable Ambulatory Status: Walker Discharge Destination: Nursing Home Transportation:  Private Auto Accompanied By: dtr Schedule Follow-up Appointment: Yes Medication Reconciliation completed and No provided to Patient/Care Danitra Payano: Provided on Clinical Summary of Care: 03/15/2017 Form Type Recipient Paper Patient [facility] Electronic Signature(s) Signed: 03/15/2017 2:57:08 PM By: Curtis Sitesorthy, Joanna Entered By: Curtis Sitesorthy, Joanna on 03/15/2017 14:57:08 Nobles, Nikeia ([id number]) -------------------------------------------------------------------------------- Lower Extremity Assessment Details Patient Name: [name], [name] Date of Service: 03/15/2017 1:30 PM Medical Record Number: [id number] Patient Account Number: [id number] Date of Birth/Sex: 08/03/1935 (81 y.o. Female) Treating RN: Curtis Sitesorthy, Joanna Primary Care Sulma Ruffino: Duncan Dullullo, Teresa Other Clinician: Referring Lawrie Tunks: Duncan Dullullo, Teresa Treating Shawn Dannenberg/Extender: Altamese CarolinaOBSON, [name] G Weeks in Treatment: 29 Vascular Assessment Pulses: Dorsalis Pedis Palpable: [Left:Yes] Posterior Tibial Extremity colors, hair growth, and conditions: Extremity Color: [Left:Hyperpigmented] Hair Growth on Extremity: [Left:No] Temperature of Extremity: [Left:Warm] Capillary Refill: [Left:< 3 seconds] Toe Nail Assessment Left: Right: Thick: Yes Discolored: Yes Deformed: No Improper Length and Hygiene: No Electronic Signature(s) Signed: 03/15/2017 4:51:47 PM By: Curtis Sitesorthy, Joanna Entered By: Curtis Sitesorthy, Joanna on 03/15/2017 13:43:35 Henzler, Vinie ([id number]) -------------------------------------------------------------------------------- Multi Wound Chart Details Patient Name: [name], [name] Date of Service: 03/15/2017 1:30 PM Medical Record Number: [id number] Patient Account Number: [id number] Date of Birth/Sex: 08/03/1935 (81 y.o. Female) Treating RN: Curtis Sitesorthy, Joanna Primary Care Delwyn Scoggin: Duncan Dullullo, Teresa Other Clinician: Referring Areej Tayler: Duncan Dullullo, Teresa Treating Brionne Mertz/Extender: Altamese CarolinaOBSON, [name] G Weeks in Treatment: 29 Vital  Signs Height(in): 66 Pulse(bpm): 72 Weight(lbs): 140.8 Blood Pressure(mmHg): 114/43 Body Mass Index(BMI): 23 Temperature(F): 96.9 Respiratory Rate 16 (breaths/min): Photos: [2:No Photos] [N/A:N/A] Wound Location: [2:Left Calcaneus] [N/A:N/A] Wounding Event: [2:Pressure Injury] [N/A:N/A] Primary Etiology: [2:Pressure Ulcer] [N/A:N/A] Comorbid History: [2:Cataracts, Anemia, Congestive Heart Failure, Hypertension, Type II Diabetes, Gout, Dementia, Neuropathy] [N/A:N/A] Date Acquired: [2:07/05/2016] [N/A:N/A] Weeks of Treatment: [2:29] [N/A:N/A] Wound Status: [2:Open] [N/A:N/A] Measurements L x W x D [2:0.5x1x0.1] [N/A:N/A] (cm) Area (cm) : [2:0.393] [N/A:N/A] Volume (cm) : [2:0.039] [N/A:N/A] % Reduction in Area: [2:96.40%] [N/A:N/A] % Reduction in Volume: [2:98.20%] [N/A:N/A] Classification: [2:Category/Stage III] [N/A:N/A] Exudate Amount: [2:Large] [N/A:N/A] Exudate Type: [2:Serous] [N/A:N/A] Exudate Color: [2:amber] [N/A:N/A] Foul Odor After Cleansing: [2:Yes] [N/A:N/A] Odor Anticipated Due to [2:No] [  N/A:N/A] Product Use: Wound Margin: [2:Flat and Intact] [N/A:N/A] Granulation Amount: [2:Large (67-100%)] [N/A:N/A] Granulation Quality: [2:Pink] [N/A:N/A] Necrotic Amount: [2:Small (1-33%)] [N/A:N/A] Necrotic Tissue: [2:Eschar, Adherent Slough] [N/A:N/A] Exposed Structures: [2:Fascia: No Fat Layer (Subcutaneous Tissue) Exposed: No Tendon: No Muscle: No Joint: No] [N/A:N/A] Bone: No Limited to Skin Breakdown Epithelialization: None N/A N/A Debridement: Debridement ([id number]) N/A N/A Pre-procedure 13:54 N/A N/A Verification/Time Out Taken: Pain Control: Lidocaine 4% Topical Solution N/A N/A Tissue Debrided: Fibrin/Slough, Callus, N/A N/A Subcutaneous Level: Skin/Subcutaneous Tissue N/A N/A Debridement Area (sq cm): 0.5 N/A N/A Instrument: Curette N/A N/A Bleeding: Minimum N/A N/A Hemostasis Achieved: Pressure N/A N/A Procedural Pain: 0 N/A N/A Post Procedural  Pain: 0 N/A N/A Debridement Treatment Procedure was tolerated well N/A N/A Response: Post Debridement 0.5x1x0.2 N/A N/A Measurements L x W x D (cm) Post Debridement Volume: 0.079 N/A N/A (cm) Post Debridement Stage: Category/Stage III N/A N/A Periwound Skin Texture: Excoriation: No N/A N/A Induration: No Callus: No Crepitus: No Rash: No Scarring: No Periwound Skin Moisture: Maceration: No N/A N/A Dry/Scaly: No Periwound Skin Color: Atrophie Blanche: No N/A N/A Cyanosis: No Ecchymosis: No Erythema: No Hemosiderin Staining: No Mottled: No Pallor: No Rubor: No Temperature: No Abnormality N/A N/A Tenderness on Palpation: Yes N/A N/A Wound Preparation: Ulcer Cleansing: N/A N/A Rinsed/Irrigated with Saline Topical Anesthetic Applied: Other: lidocaine 4% Procedures Performed: Debridement N/A N/A Treatment Notes Electronic Signature(s) Signed: 03/16/2017 8:07:00 AM By: Baltazar Najjar MD Entered By: Baltazar Najjar on 03/15/2017 14:19:15 Clutter, Aubrea ([id number]) -------------------------------------------------------------------------------- Multi-Disciplinary Care Plan Details Patient Name: [name] Date of Service: 03/15/2017 1:30 PM Medical Record Number: [id number] Patient Account Number: [id number] Date of Birth/Sex: 26-Apr-1936 (80 y.o. Female) Treating RN: Curtis Sites Primary Care Bethann Qualley: Duncan Dull Other Clinician: Referring Braxley Balandran: Duncan Dull Treating Janifer Gieselman/Extender: Altamese [place] in Treatment: 26 Active Inactive ` Abuse / Safety / Falls / Self Care Management Nursing Diagnoses: Impaired physical mobility Potential for falls Goals: Patient will remain injury free Date Initiated: 08/18/2016 Target Resolution Date: 10/29/2016 Goal Status: Active Interventions: Assess fall risk on admission and as needed Notes: ` Nutrition Nursing Diagnoses: Potential for alteratiion in Nutrition/Potential for imbalanced  nutrition Goals: Patient/caregiver agrees to and verbalizes understanding of need to use nutritional supplements and/or vitamins as prescribed Date Initiated: 08/18/2016 Target Resolution Date: 10/29/2016 Goal Status: Active Interventions: Assess patient nutrition upon admission and as needed per policy Notes: ` Orientation to the Wound Care Program Nursing Diagnoses: Knowledge deficit related to the wound healing center program Goals: Patient/caregiver will verbalize understanding of the Wound Healing Center Program Date Initiated: 08/18/2016 Target Resolution Date: 10/29/2016 Goal Status: Active Totaro, Channah ([id number]) Interventions: Provide education on orientation to the wound center Notes: ` Wound/Skin Impairment Nursing Diagnoses: Impaired tissue integrity Goals: Patient/caregiver will verbalize understanding of skin care regimen Date Initiated: 08/18/2016 Target Resolution Date: 10/29/2016 Goal Status: Active Ulcer/skin breakdown will have a volume reduction of 30% by week 4 Date Initiated: 08/18/2016 Target Resolution Date: 10/29/2016 Goal Status: Active Ulcer/skin breakdown will have a volume reduction of 50% by week 8 Date Initiated: 08/18/2016 Target Resolution Date: 10/29/2016 Goal Status: Active Ulcer/skin breakdown will have a volume reduction of 80% by week 12 Date Initiated: 08/18/2016 Target Resolution Date: 10/29/2016 Goal Status: Active Ulcer/skin breakdown will heal within 14 weeks Date Initiated: 08/18/2016 Target Resolution Date: 10/29/2016 Goal Status: Active Interventions: Assess patient/caregiver ability to obtain necessary supplies Assess patient/caregiver ability to perform ulcer/skin care regimen upon admission and as needed Assess ulceration(s) every visit Notes: Electronic Signature(s) Signed:  03/15/2017 4:51:47 PM By: Curtis Sites Entered By: Curtis Sites on 03/15/2017 13:54:06 Vanorder, Latrish  ([id number]) -------------------------------------------------------------------------------- Pain Assessment Details Patient Name: [name] Date of Service: 03/15/2017 1:30 PM Medical Record Number: [id number] Patient Account Number: [id number] Date of Birth/Sex: [birth year] (80 y.o. Female) Treating RN: Curtis Sites Primary Care Bellany Elbaum: Duncan Dull Other Clinician: Referring Gualberto Wahlen: Duncan Dull Treating Humza Tallerico/Extender: Altamese Edmundson in Treatment: 29 Active Problems Location of Pain Severity and Description of Pain Patient Has Paino No Site Locations Pain Management and Medication Current Pain Management: Notes Topical or injectable lidocaine is offered to patient for acute pain when surgical debridement is performed. If needed, Patient is instructed to use over the counter pain medication for the following 24-48 hours after debridement. Wound care MDs do not prescribed pain medications. Patient has chronic pain or uncontrolled pain. Patient has been instructed to make an appointment with their Primary Care Physician for pain management. Electronic Signature(s) Signed: 03/15/2017 4:51:47 PM By: Curtis Sites Entered By: Curtis Sites on 03/15/2017 13:40:43 Eckstein, Xena ([id number]) -------------------------------------------------------------------------------- Patient/Caregiver Education Details Patient Name: [name] Date of Service: 03/15/2017 1:30 PM Medical Record Number: [id number] Patient Account Number: [id number] Date of Birth/Gender: November 15, 1935 (80 y.o. Female) Treating RN: Curtis Sites Primary Care Physician: Duncan Dull Other Clinician: Referring Physician: Duncan Dull Treating Physician/Extender: Altamese Tintah in Treatment: 38 Education Assessment Education Provided To: Caregiver SNF nurses via written orders Education Topics Provided Wound/Skin Impairment: Handouts: Other: wound care as ordered Methods:  Clinical cytogeneticist) Signed: 03/15/2017 4:51:47 PM By: Curtis Sites Entered By: Curtis Sites on 03/15/2017 14:57:52 Serratore, Yeraldin ([id number]) -------------------------------------------------------------------------------- Wound Assessment Details Patient Name: [name] Date of Service: 03/15/2017 1:30 PM Medical Record Number: [id number] Patient Account Number: [id number] Date of Birth/Sex: Sep 18, 1935 (80 y.o. Female) Treating RN: Curtis Sites Primary Care Tylon Kemmerling: Duncan Dull Other Clinician: Referring Zameria Vogl: Duncan Dull Treating Jesusmanuel Erbes/Extender: Altamese  in Treatment: 29 Wound Status Wound Number: 2 Primary Pressure Ulcer Etiology: Wound Location: Left Calcaneus Wound Open Wounding Event: Pressure Injury Status: Date Acquired: 07/05/2016 Comorbid Cataracts, Anemia, Congestive Heart Failure, Weeks Of Treatment: 29 History: Hypertension, Type II Diabetes, Gout, Clustered Wound: No Dementia, Neuropathy Photos Photo Uploaded By: Curtis Sites on 03/15/2017 16:34:32 Wound Measurements Length: (cm) 0.5 Width: (cm) 1 Depth: (cm) 0.1 Area: (cm) 0.393 Volume: (cm) 0.039 % Reduction in Area: 96.4% % Reduction in Volume: 98.2% Epithelialization: None Tunneling: No Undermining: No Wound Description Classification: Category/Stage III Wound Margin: Flat and Intact Exudate Amount: Large Exudate Type: Serous Exudate Color: amber Foul Odor After Cleansing: Yes Due to Product Use: No Slough/Fibrino No Wound Bed Granulation Amount: Large (67-100%) Exposed Structure Granulation Quality: Pink Fascia Exposed: No Necrotic Amount: Small (1-33%) Fat Layer (Subcutaneous Tissue) Exposed: No Necrotic Quality: Eschar, Adherent Slough Tendon Exposed: No Muscle Exposed: No Joint Exposed: No Bone Exposed: No Limited to Skin Breakdown Muzio, Isela ([id number]) Periwound Skin Texture Texture Color No Abnormalities Noted: No No  Abnormalities Noted: No Callus: No Atrophie Blanche: No Crepitus: No Cyanosis: No Excoriation: No Ecchymosis: No Induration: No Erythema: No Rash: No Hemosiderin Staining: No Scarring: No Mottled: No Pallor: No Moisture Rubor: No No Abnormalities Noted: No Dry / Scaly: No Temperature / Pain Maceration: No Temperature: No Abnormality Tenderness on Palpation: Yes Wound Preparation Ulcer Cleansing: Rinsed/Irrigated with Saline Topical Anesthetic Applied: Other: lidocaine 4%, Treatment Notes Wound #2 (Left Calcaneus) 1. Cleansed with: Clean wound with Normal Saline 2. Anesthetic Topical Lidocaine 4% cream to wound bed prior to debridement 4. Dressing  Applied: Hydrafera Blue 5. Secondary Dressing Applied Foam Gauze and Kerlix/Conform 7. Secured with Tape Notes heel cup, netting Electronic Signature(s) Signed: 03/15/2017 4:51:47 PM By: Curtis Sitesorthy, Joanna Entered By: Curtis Sitesorthy, Joanna on 03/15/2017 13:43:13 Corlett, Janina ([id number]) -------------------------------------------------------------------------------- Vitals Details Patient Name: [name], [name] Date of Service: 03/15/2017 1:30 PM Medical Record Number: [id number] Patient Account Number: [id number] Date of Birth/Sex: 10/30/1935 (80 y.o. Female) Treating RN: Curtis Sitesorthy, Joanna Primary Care Gael Delude: Duncan Dullullo, Teresa Other Clinician: Referring Hillery Bhalla: Duncan Dullullo, Teresa Treating Carisa Backhaus/Extender: Altamese CarolinaOBSON, [name] G Weeks in Treatment: 29 Vital Signs Time Taken: 13:43 Temperature (F): 96.9 Height (in): 66 Pulse (bpm): 72 Weight (lbs): 140.8 Respiratory Rate (breaths/min): 16 Body Mass Index (BMI): 22.7 Blood Pressure (mmHg): 114/43 Reference Range: 80 - 120 mg / dl Electronic Signature(s) Signed: 03/15/2017 4:51:47 PM By: Curtis Sitesorthy, Joanna Entered By: Curtis Sitesorthy, Joanna on 03/15/2017 13:44:36

## 2017-03-17 NOTE — Progress Notes (Signed)
[name], [name] Visit Report for 03/15/2017 Debridement Details Patient Name: [name], [name] Date of Service: 03/15/2017 1:30 PM Medical Record Number: [id number] Patient Account Number: [id number] Date of Birth/Sex: December 27, 1935 (80 y.o. Female) Treating RN: Curtis Sites Primary Care Provider: Duncan Dull Other Clinician: Referring Provider: Duncan Dull Treating Provider/Extender: Altamese [place] in Treatment: 29 Debridement Performed for Wound #2 Left Calcaneus Assessment: Performed By: Physician Maxwell Caul, MD Debridement: Debridement Pre-procedure Verification/Time Yes - 13:54 Out Taken: Start Time: 13:54 Pain Control: Lidocaine 4% Topical Solution Level: Skin/Subcutaneous Tissue Total Area Debrided (L x W): 0.5 (cm) x 1 (cm) = 0.5 (cm) Tissue and other material Viable, Non-Viable, Callus, Fibrin/Slough, Subcutaneous debrided: Instrument: Curette Bleeding: Minimum Hemostasis Achieved: Pressure End Time: 13:56 Procedural Pain: 0 Post Procedural Pain: 0 Response to Treatment: Procedure was tolerated well Post Debridement Measurements of Total Wound Length: (cm) 0.5 Stage: Category/Stage III Width: (cm) 1 Depth: (cm) 0.2 Volume: (cm) 0.079 Character of Wound/Ulcer Post Improved Debridement: Post Procedure Diagnosis Same as Pre-procedure Electronic Signature(s) Signed: 03/15/2017 4:51:47 PM By: Curtis Sites Signed: 03/16/2017 8:07:00 AM By: Baltazar Najjar MD Entered By: Baltazar Najjar on 03/15/2017 14:19:25 Mak, Raevyn ([id number]) -------------------------------------------------------------------------------- HPI Details Patient Name: [name] Date of Service: 03/15/2017 1:30 PM Medical Record Number: [id number] Patient Account Number: [id number] Date of Birth/Sex: [birth year] (80 y.o. Female) Treating RN: Curtis Sites Primary Care Provider: Duncan Dull Other Clinician: Referring Provider: Duncan Dull Treating Provider/Extender: Altamese [place] in Treatment: 59 History of Present Illness HPI Description: 08/18/16; this is a 81 year old woman who is a type II diabetic not currently on any treatment. She is also listed as having Alzheimer's disease hypertension. She has a history of chronic venous insufficiency with leg wounds in the past but no history of wounds in her feet. In terms of her diabetes at one point she was on insulin however she is no longer on any current treatment, her daughter who is providing most of the history is unaware of her hemoglobin A1c. She has stage IV chronic renal failure and follows with nephrology. The history provided by her daughter is that she has had a wound on the left heel perhaps dating back to last May/17. At that point she was in the hospital predominantly with psychiatric issues and was [facility]. This was treated with some form of foam dressing and may have actually healed however she was readmitted to hospital in January with Escherichia coli sepsis felt to be secondary to a UTI. Since then she is in [facility] skilled facility. Apparently this timeframe both the left heel and right heel reopened or at least the daughter became aware that they were both open. The exact timeframe isn't really certain Her ABIs in this clinic were 1.08 on the right 1.25 on the left. Looking through [employer] doesn't really provide any useful information. She did have venous ultrasounds in 2016 that did not show a DVT. I don't see a recent hemoglobin A1c 08/25/16; from last week we have been using Santyl to both heels. Apparently the nursing home didn't get an x-ray of both heels [[facility]] and the daughter states that there was "no injury to bone" but for some reason we haven't been able to get the official report from them. 09/01/16; continued improvement in both wounds on her bilateral heels using Santyl. X-ray report was  negative for osteomyelitis 09/08/16; patient from [facility] skilled facility. Using Santyl on both her heel wounds which are fortunately not  on the plantar surface. 09/15/16; patient is from [facility] skilled facility. She has been using Santyl on both her pressure areas which were stage III. Fortunately these or not on the plantar surface. 09/22/16; patient arrives today with a much less viable looking surface on her heels. We had been using Santyl with good improvement however unchanged either fair last week. This may be unrelated however she required debridement today. 09/29/16; patient arrives with a better looking surface on the left heel unfortunately this is a more substantial wound. The area on the right lateral calcaneus again covered in a nonviable necrotic surface. We have been making good progress with Santyl. I think I'm going to need to go back to a debriding agent. 10/06/16; still nonviable surface material over the right greater than the left heel. Apparently the facility where this woman resides did not have Iodoflex so rather than calling the clinic they decided to make some cocktail of material and then settled on Hydrofera Blue. Patient is really generally made pretty good progress versus when she came in her first however still has too much nonviable surface especially on the right. 10/13/16; she arrives today with Iodoflex on both heels. This was the dressing that we couldn't seem to get last week. Her daughter tells [place] that where they were traveling to [place] last weekend she actually use Santyl. Both wounds are in better condition today and didn't seem to require debridement however I'm not exactly sure what is been most helpful 10/20/16; using Santyl to both heels better condition. Nursing reports green drainage 11/03/16 patient wounds appeared to be doing fairly well on evaluation today she seen along with her daughter in the office today. She does have slough  covering the wound unfortunately no evidence of infection. 11/10/16; both wounds appear to be making nice progress. This is on the bilateral heels 11/24/16; he also continued to be making nice progress. Wounds are smaller. Using Hydrofera Blue. Orders are to change daily in the nursing home although according to her daughter they're actually being changed to her 3 times a week which is probably satisfactory 12/08/16 on evaluation today patient bilateral hills appear to be making improvement. Improvement is somewhat slow but nonetheless week by week we are noting some change. Overall I'm pleased with the progress that we have seen. Nonetheless patient states that she really would like for this to already be completely healed and closed which obviously it is not. She wonders how long it's gonna be before she can get back to normal walking and wearing shoes. No fevers, chills, nausea, or vomiting noted at this time. Secondary to mental status patient is unable to rate or describe her pain. BASTONE, Nalayah ([id number]) 12/22/16; patient continues to make nice progress. The area on the right lateral calcaneus as improved and dimensions quite a bit since the last time I saw this, the area on the left is more substantial but also seems to be making improvement. No debridement was required in any area 01/05/17; the patient came in with the right lateral calcaneus covered an eschar. After removal there is still a small open area here. The area on the left still required debridement of circumferential senescent edges of tissue and nonviable surface material. We've been using Hydrofera Blue and making progress albeit slowly 01/19/17; patient has skilled facility I've been following for substantial right and left calcaneus pressure ulcers. We've been using Hydrofera Blue. The one on the right appears to be closed today. Left down in dimensions 02/02/17;  the area on the right lateral heel remains closed. Still be open  wound on the left. Dimensions down however not as much as I would like. Using Hydrofera Blue 02/16/17; 2 week follow-up for wound remaining on the right lateral heel. We have been using Hydrofera Blue. The patient states she feels well and has no complaints 03/01/17; 2 week follow-up for the wound remaining on her right lateral heel which looks quite good today we've been using Hydrofera Blue as the primary dressing. Our intake nurse reports that the patient is somewhat listless and not nearly as steady on her feet as usual. This information is seconded by the patient's daughter who is present. The patient is at a nursing home. In discussing things with her daughter she apparently has a history of psychosis predating some dementia. She is on Risperdal 3 mg a day. Per the daughter she has not had any recent psychosis. The patient denies fever, chills, sore throat, shortness of breath, diarrhea or dysuria.. She is not had any headaches but states her balance is poor 03/15/17; 2 week follow-up of remaining wound on the right lateral heel. Unfortunately no major change today we've been using Hydrofera Blue. There are thick surrounding edges to this which are going to require debridement. Electronic Signature(s) Signed: 03/16/2017 8:07:00 AM By: Baltazar Najjarobson, [name] MD Entered By: Baltazar Najjarobson, [name] on 03/15/2017 14:20:09 [name], [name] ([id number]) -------------------------------------------------------------------------------- Physical Exam Details Patient Name: [name], [name] Date of Service: 03/15/2017 1:30 PM Medical Record Number: [id number] Patient Account Number: [id number] Date of Birth/Sex: 13-Dec-1935 (80 y.o. Female) Treating RN: Curtis Sitesorthy, Joanna Primary Care Provider: Duncan Dullullo, Teresa Other Clinician: Referring Provider: Duncan Dullullo, Teresa Treating Provider/Extender: Altamese CarolinaOBSON, [name] G Weeks in Treatment: 29 Constitutional Sitting or standing Blood Pressure is within target range for patient.. . Pulse  regular and within target range for patient.Marland Kitchen. Respirations regular, non-labored and within target range.. Temperature is normal and within the target range for the patient.Marland Kitchen. appears in no distress. Notes Wound exam; the area on the left lateral heel continues to look the same as 2 weeks ago which is disappointing. The surface of the wound did not look too bad however she has reasonably thick surrounding areas of skin and subcutaneous tissue that I debrided to get down to the wound surface. Hopefully this will give a better chance at epithelialization. Hemostasis with direct pressure Electronic Signature(s) Signed: 03/16/2017 8:07:00 AM By: Baltazar Najjarobson, [name] MD Entered By: Baltazar Najjarobson, [name] on 03/15/2017 14:21:10 [name], [name] ([id number]) -------------------------------------------------------------------------------- Physician Orders Details Patient Name: [name], [name] Date of Service: 03/15/2017 1:30 PM Medical Record Number: [id number] Patient Account Number: [id number] Date of Birth/Sex: 13-Dec-1935 (80 y.o. Female) Treating RN: Curtis Sitesorthy, Joanna Primary Care Provider: Duncan Dullullo, Teresa Other Clinician: Referring Provider: Duncan Dullullo, Teresa Treating Provider/Extender: Altamese CarolinaOBSON, [name] G Weeks in Treatment: 4029 Verbal / Phone Orders: No Diagnosis Coding Wound Cleansing Wound #2 Left Calcaneus o Clean wound with Normal Saline. o May Shower, gently pat wound dry prior to applying new dressing. Anesthetic Wound #2 Left Calcaneus o Topical Lidocaine 4% cream applied to wound bed prior to debridement Primary Wound Dressing Wound #2 Left Calcaneus o Hydrafera Blue Secondary Dressing Wound #2 Left Calcaneus o Dry Gauze o Conform/Kerlix o Foam - heel cups o Other - stretch netting #4 (please order for pt...daughter is aware) Dressing Change Frequency Wound #2 Left Calcaneus o Change dressing every day. Follow-up Appointments Wound #2 Left Calcaneus o Return Appointment in 2  weeks. Edema Control Wound #2 Left Calcaneus o Elevate legs to the level of the heart  and pump ankles as often as possible Off-Loading Wound #2 Left Calcaneus o Turn and reposition every 2 hours o Other: - wear heel protector boots at al times in bed, float heels while in bed, Additional Orders / Instructions Wound #2 Left Calcaneus o Increase protein intake. o Other: - please add vitamin A, vitamin C and zinc supplements to your diet Bugbee, Zettie ([id number]) Electronic Signature(s) Signed: 03/15/2017 4:51:47 PM By: Curtis Sites Signed: 03/16/2017 8:07:00 AM By: Baltazar Najjar MD Entered By: Curtis Sites on 03/15/2017 13:57:21 [name], [name] ([id number]) -------------------------------------------------------------------------------- Problem List Details Patient Name: [name] Date of Service: 03/15/2017 1:30 PM Medical Record Number: [id number] Patient Account Number: [id number] Date of Birth/Sex: 22-Oct-1935 (80 y.o. 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 Female) Treating RN: Curtis Sites Primary Care Provider: Duncan Dull Other Clinician: Referring Provider: Duncan Dull Treating Provider/Extender: Altamese [place] in Treatment: 29 Subjective History of Present Illness (HPI) 08/18/16; this is a 81 year old woman who is a type II diabetic not currently on any treatment. She is also listed as having Alzheimer's disease hypertension. She has a history of chronic venous insufficiency with leg wounds in the past but no history of wounds in her feet. In terms of her diabetes at one point she was on insulin however she is no longer on any current treatment, her daughter who is providing most of the history is unaware of her hemoglobin A1c. She has stage IV chronic renal failure and follows with nephrology. The history provided by her daughter is that she has had a wound on the left heel perhaps dating back to last May/17. At that point she was in the hospital predominantly with psychiatric issues and was [facility]. This was treated with some form of foam dressing and may have actually healed however she was readmitted to hospital in January with Escherichia coli sepsis felt to be secondary to a UTI. Since then she is in [facility] skilled facility. Apparently this timeframe both the left heel and right heel reopened or at least the daughter became aware that they were both open. The exact timeframe isn't really certain Her ABIs in this clinic were 1.08 on the right 1.25 on the left. Looking through [employer] doesn't really provide any useful information. She did have venous ultrasounds in 2016 that did not show a DVT. I don't see a recent hemoglobin A1c 08/25/16; from last week we have been using Santyl to both heels. Apparently the nursing home didn't get an x-ray of both heels [[facility]] and the daughter states that there was "no injury to bone" but for some reason we haven't been able  to get the official report from them. 09/01/16; continued improvement in both wounds on her bilateral heels using Santyl. X-ray report was negative for osteomyelitis 09/08/16; patient from [facility]. Using Santyl on both her heel wounds which are fortunately not on the plantar surface. 09/15/16; patient is from [facility] skilled facility. She has been using Santyl on both her pressure areas which were stage III. Fortunately these or not on the plantar surface. 09/22/16; patient arrives today with a much less viable looking surface on her heels. We had been using Santyl with good improvement however unchanged either fair last week. This may be unrelated however she required debridement today. 09/29/16; patient arrives with a better looking surface on the left heel unfortunately this is a more substantial wound. The area on the right lateral calcaneus again covered in a nonviable necrotic surface. We have been making good progress with Santyl. I think I'm going to need to go back to a debriding agent. 10/06/16; still nonviable surface material over the right greater than the left heel. Apparently the facility where this woman resides did not have Iodoflex so rather than calling the clinic they decided to make some cocktail of material and then settled on Hydrofera Blue. Patient is really generally made pretty good progress versus when she came in her first however still has too much nonviable surface especially on the right. 10/13/16; she arrives today with Iodoflex on both heels. This was the dressing that we couldn't seem to get last week. Her daughter tells [place] that where they were traveling to [place] last weekend she actually use Santyl. Both wounds are in better condition today and didn't seem to require debridement however I'm not exactly sure what is been most helpful 10/20/16; using Santyl to both heels better condition. Nursing reports green drainage 11/03/16 patient  wounds appeared to be doing fairly well on evaluation today she seen along with her daughter in the office today. She does have slough covering the wound unfortunately no evidence of infection. 11/10/16; both wounds appear to be making nice progress. This is on the bilateral heels 11/24/16; he also continued to be making nice progress. Wounds are smaller. Using Hydrofera Blue. Orders are to change daily in the nursing home although according to her daughter they're actually being changed to her 3 times a week which is probably satisfactory 12/08/16 on evaluation today patient bilateral hills appear to be making improvement. Improvement is somewhat slow but nonetheless week by week we are noting some change. Overall I'm pleased with the progress that we have seen. Nonetheless patient states that she really would like for this to already be completely healed and closed which obviously it is not. She wonders how long it's gonna be before she can get back to normal walking and wearing shoes. No fevers, chills, [name], [name] nausea, or vomiting noted at this time. Secondary to mental status patient is unable to rate or describe her pain. 12/22/16; patient continues to make nice progress. The area on the right lateral calcaneus as improved and dimensions quite a bit since the last time I saw this, the area on the left is more substantial but also seems to be making improvement. No debridement was required in any area 01/05/17; the patient came in with the right lateral calcaneus covered an eschar. After removal there is still a small open area here. The area on the left still required debridement of circumferential senescent edges of tissue and nonviable surface material. We've been using Hydrofera Blue and making progress albeit slowly 01/19/17; patient has skilled facility I've been following for substantial right and left calcaneus pressure ulcers. We've been using Hydrofera Blue. The  one on  the right appears to be closed today. Left down in dimensions 02/02/17; the area on the right lateral heel remains closed. Still be open wound on the left. Dimensions down however not as much as I would like. Using Hydrofera Blue 02/16/17; 2 week follow-up for wound remaining on the right lateral heel. We have been using Hydrofera Blue. The patient states she feels well and has no complaints 03/01/17; 2 week follow-up for the wound remaining on her right lateral heel which looks quite good today we've been using Hydrofera Blue as the primary dressing. Our intake nurse reports that the patient is somewhat listless and not nearly as steady on her feet as usual. This information is seconded by the patient's daughter who is present. The patient is at a nursing home. In discussing things with her daughter she apparently has a history of psychosis predating some dementia. She is on Risperdal 3 mg a day. Per the daughter she has not had any recent psychosis. The patient denies fever, chills, sore throat, shortness of breath, diarrhea or dysuria.. She is not had any headaches but states her balance is poor 03/15/17; 2 week follow-up of remaining wound on the right lateral heel. Unfortunately no major change today we've been using Hydrofera Blue. There are thick surrounding edges to this which are going to require debridement. Objective Constitutional Sitting or standing Blood Pressure is within target range for patient.. Pulse regular and within target range for patient.Marland Kitchen Respirations regular, non-labored and within target range.. Temperature is normal and within the target range for the patient.Marland Kitchen appears in no distress. Vitals Time Taken: 1:43 PM, Height: 66 in, Weight: 140.8 lbs, BMI: 22.7, Temperature: 96.9 F, Pulse: 72 bpm, Respiratory Rate: 16 breaths/min, Blood Pressure: 114/43 mmHg. General Notes: Wound exam; the area on the left lateral heel continues to look the same as 2 weeks ago which  is disappointing. The surface of the wound did not look too bad however she has reasonably thick surrounding areas of skin and subcutaneous tissue that I debrided to get down to the wound surface. Hopefully this will give a better chance at epithelialization. Hemostasis with direct pressure Integumentary (Hair, Skin) Wound #2 status is Open. Original cause of wound was Pressure Injury. The wound is located on the Left Calcaneus. The wound measures 0.5cm length x 1cm width x 0.1cm depth; 0.393cm^2 area and 0.039cm^3 volume. The wound is limited to skin breakdown. There is no tunneling or undermining noted. There is a large amount of serous drainage noted. Foul odor after cleansing was noted. The wound margin is flat and intact. There is large (67-100%) pink granulation within the wound bed. There is a small (1-33%) amount of necrotic tissue within the wound bed including Eschar and Adherent Slough. The periwound skin appearance did not exhibit: Callus, Crepitus, Excoriation, Induration, Rash, Scarring, Dry/Scaly, Maceration, Atrophie Blanche, Cyanosis, Ecchymosis, Hemosiderin Staining, Mottled, Pallor, Rubor, Erythema. Periwound temperature was noted as No Abnormality. The periwound has tenderness on palpation. [name], [name] Assessment Active Problems ICD-10 E11.621 - Type 2 diabetes mellitus with foot ulcer L89.623 - Pressure ulcer of left heel, stage 3 L89.613 - Pressure ulcer of right heel, stage 3 G21.11 - Neuroleptic induced parkinsonism Procedures Wound #2 Pre-procedure diagnosis of Wound #2 is a Pressure Ulcer located on the Left Calcaneus . There was a Skin/Subcutaneous Tissue Debridement (40981-19147) debridement with total area of 0.5 sq cm performed by Maxwell Caul, MD. with the following instrument(s): Curette to remove Viable and Non-Viable tissue/material  including Fibrin/Slough, Callus, and Subcutaneous after achieving pain control using Lidocaine 4% Topical  Solution. A time out was conducted at 13:54, prior to the start of the procedure. A Minimum amount of bleeding was controlled with Pressure. The procedure was tolerated well with a pain level of 0 throughout and a pain level of 0 following the procedure. Post Debridement Measurements: 0.5cm length x 1cm width x 0.2cm depth; 0.079cm^3 volume. Post debridement Stage noted as Category/Stage III. Character of Wound/Ulcer Post Debridement is improved. Post procedure Diagnosis Wound #2: Same as Pre-Procedure Plan Wound Cleansing: Wound #2 Left Calcaneus: Clean wound with Normal Saline. May Shower, gently pat wound dry prior to applying new dressing. Anesthetic: Wound #2 Left Calcaneus: Topical Lidocaine 4% cream applied to wound bed prior to debridement Primary Wound Dressing: Wound #2 Left Calcaneus: Hydrafera Blue Secondary Dressing: Wound #2 Left Calcaneus: Dry Gauze Conform/Kerlix Foam - heel cups Other - stretch netting #4 (please order for pt...daughter is aware) Dressing Change Frequency: Wound #2 Left Calcaneus: Change dressing every day. Follow-up Appointments: Wound #2 Left Calcaneus: [name], [name] ([id number]) Return Appointment in 2 weeks. Edema Control: Wound #2 Left Calcaneus: Elevate legs to the level of the heart and pump ankles as often as possible Off-Loading: Wound #2 Left Calcaneus: Turn and reposition every 2 hours Other: - wear heel protector boots at al times in bed, float heels while in bed, Additional Orders / Instructions: Wound #2 Left Calcaneus: Increase protein intake. Other: - please add vitamin A, vitamin C and zinc supplements to your diet Continue with hydrofera blue/foam Electronic Signature(s) Signed: 03/16/2017 8:07:00 AM By: Baltazar Najjarobson, [name] MD Entered By: Baltazar Najjarobson, [name] on 03/15/2017 14:22:12 [name], [name] ([id number]) -------------------------------------------------------------------------------- SuperBill Details Patient Name: [name],  [name] Date of Service: 03/15/2017 Medical Record Number: [id number] Patient Account Number: [id number] Date of Birth/Sex: 1935/11/16 (80 y.o. 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

## 2017-03-29 IMAGING — CR DG CHEST 2V
2 series · 2 of 2 positions shown · non-contrast
Comparison: 09/22/2015.  09/07/2014.  09/03/2014.  01/06/2014.

CLINICAL DATA: Altered mental status.  Fever.  Low blood pressure.

EXAM:
CHEST  2 VIEW

[chest lat]
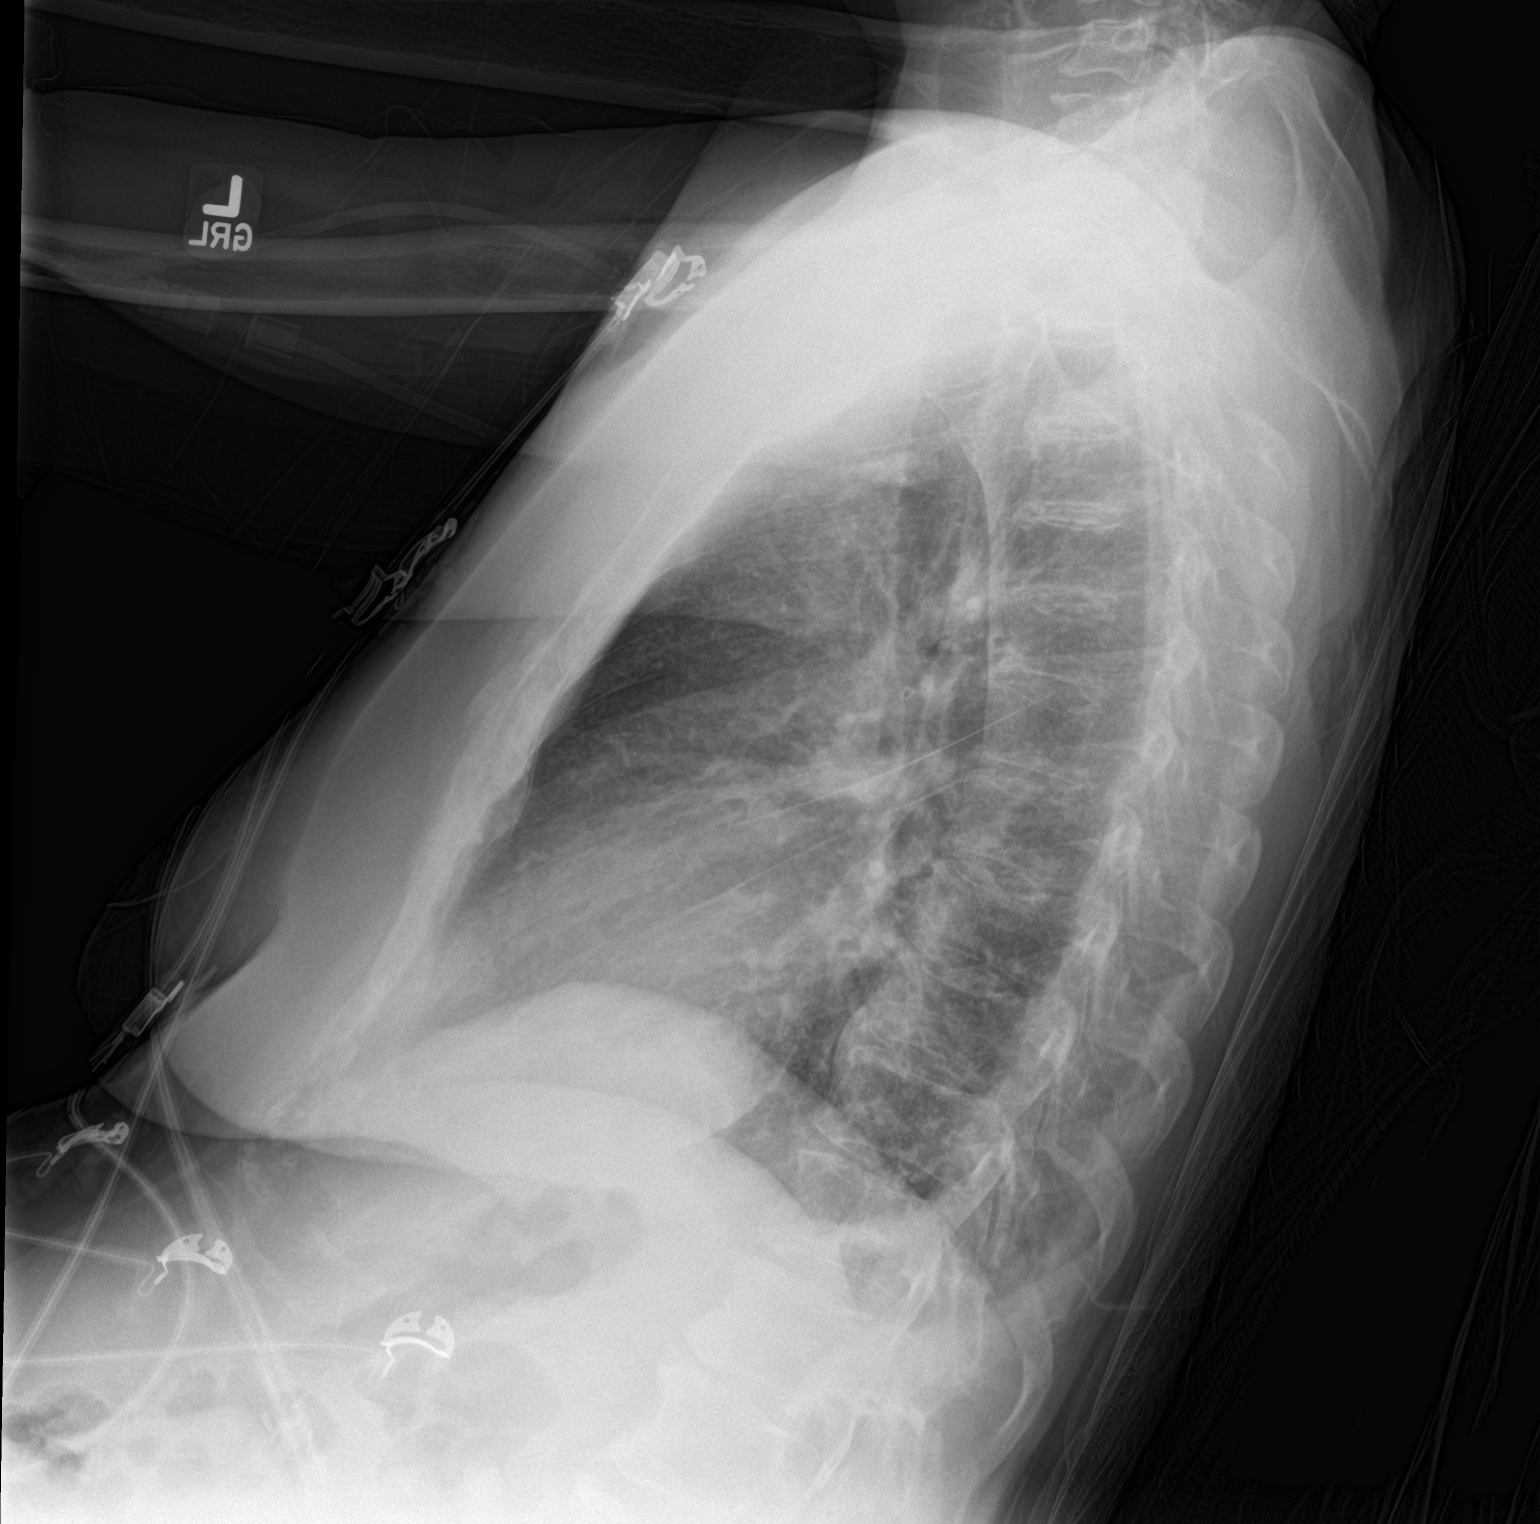

[chest ap]
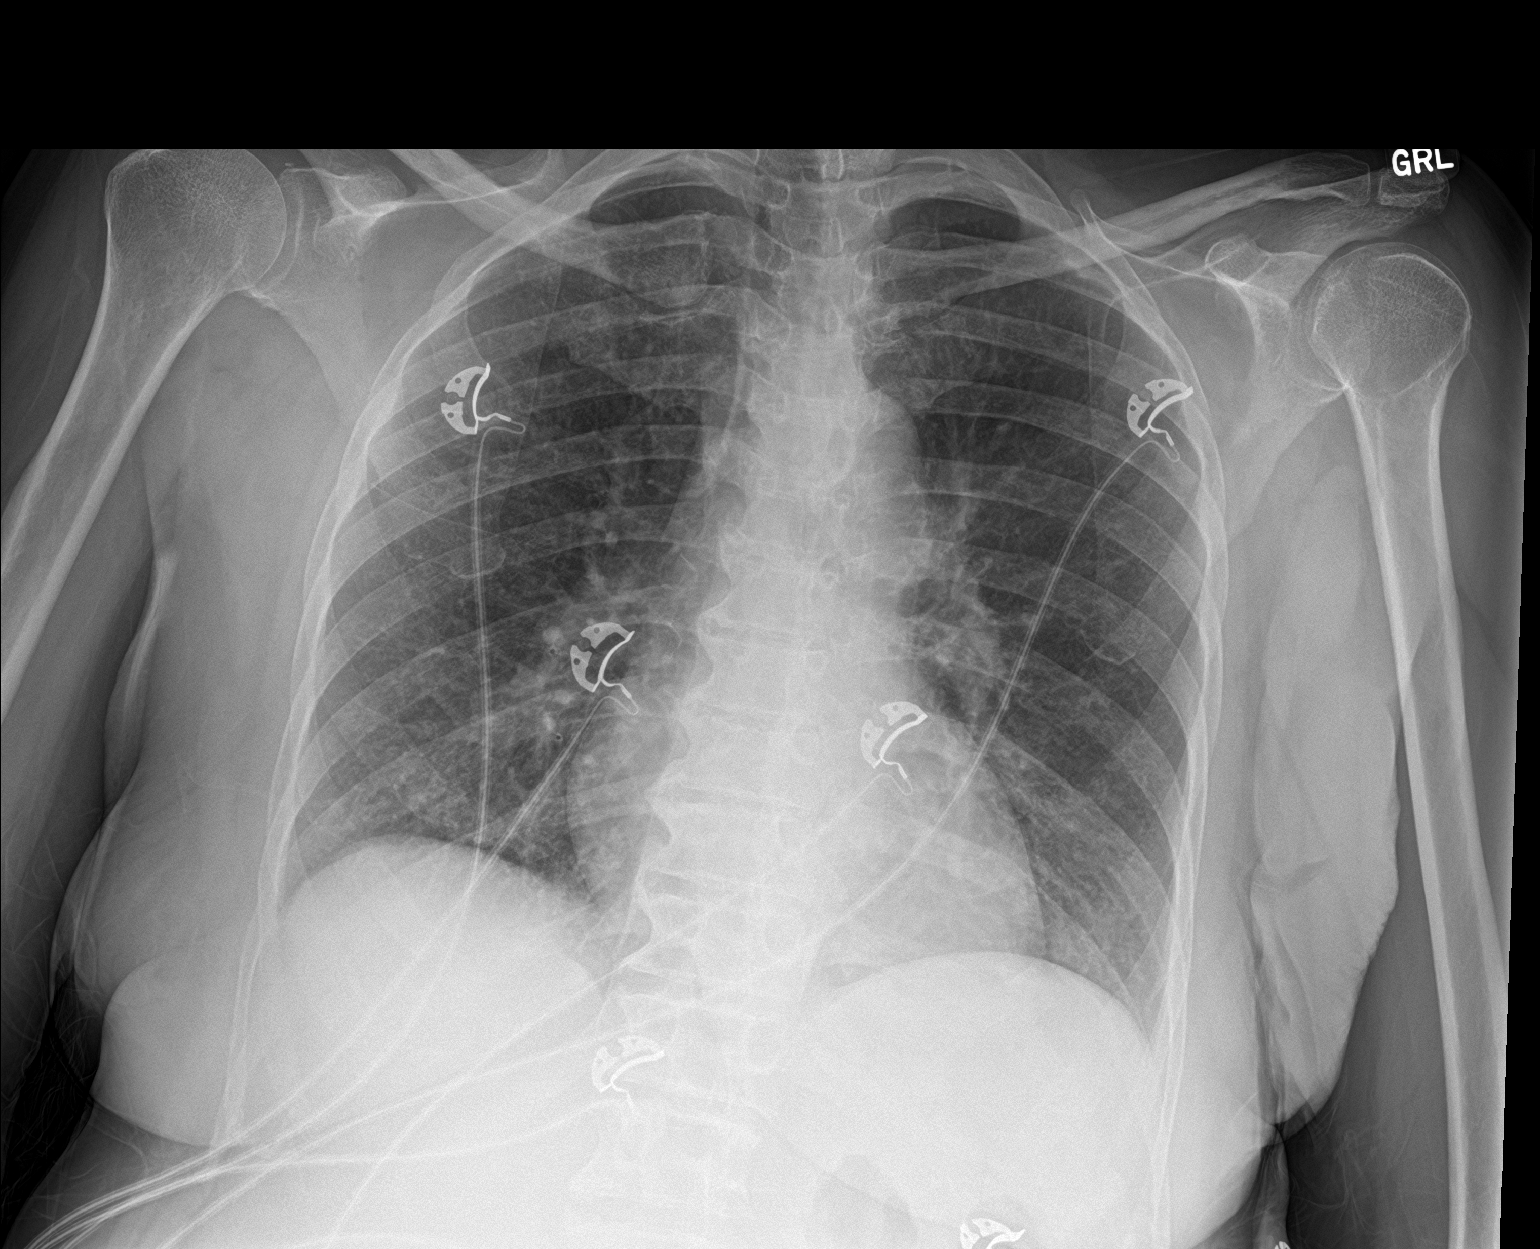

[2 of 2 positions shown; findings below may reference images not displayed]

FINDINGS: Mediastinum and hilar structures are normal. Mild right perihilar
and right base infiltrate. No pleural effusion or pneumothorax.
Heart size stable. No acute bony abnormality.
IMPRESSION: Mild right infrahilar and right base infiltrate.

## 2017-03-30 ENCOUNTER — Encounter: Payer: Medicare (Managed Care) | Attending: Internal Medicine | Admitting: Internal Medicine

## 2017-03-30 DIAGNOSIS — G309 Alzheimer's disease, unspecified: Secondary | ICD-10-CM | POA: Diagnosis not present

## 2017-03-30 DIAGNOSIS — E11621 Type 2 diabetes mellitus with foot ulcer: Secondary | ICD-10-CM | POA: Insufficient documentation

## 2017-03-30 DIAGNOSIS — G2111 Neuroleptic induced parkinsonism: Secondary | ICD-10-CM | POA: Diagnosis not present

## 2017-03-30 DIAGNOSIS — E1122 Type 2 diabetes mellitus with diabetic chronic kidney disease: Secondary | ICD-10-CM | POA: Insufficient documentation

## 2017-03-30 DIAGNOSIS — I129 Hypertensive chronic kidney disease with stage 1 through stage 4 chronic kidney disease, or unspecified chronic kidney disease: Secondary | ICD-10-CM | POA: Insufficient documentation

## 2017-03-30 DIAGNOSIS — L89613 Pressure ulcer of right heel, stage 3: Secondary | ICD-10-CM | POA: Diagnosis not present

## 2017-03-30 DIAGNOSIS — N184 Chronic kidney disease, stage 4 (severe): Secondary | ICD-10-CM | POA: Insufficient documentation

## 2017-03-30 DIAGNOSIS — L89623 Pressure ulcer of left heel, stage 3: Secondary | ICD-10-CM | POA: Insufficient documentation

## 2017-03-30 DIAGNOSIS — F0280 Dementia in other diseases classified elsewhere without behavioral disturbance: Secondary | ICD-10-CM | POA: Diagnosis not present

## 2017-04-01 NOTE — Progress Notes (Signed)
[name], [name] ([id number]) Visit Report for 03/30/2017 HPI Details Patient Name: [name] LoboCHEELY, [name] Date of Service: 03/30/2017 1:30 PM Medical Record Number: [id number] Patient Account Number: [id number] Date of Birth/Sex: 1936-01-16 (81 y.o. Female) Treating RN: Primary Care Provider: Duncan [name], [name] Other Clinician: Referring Provider: Duncan [name], [name] Treating Provider/Extender: [name], [name] in Treatment: 32 History of Present Illness HPI Description: 08/18/16; this is a 81 year old woman who is a type II diabetic not currently on any treatment. She is also listed as having Alzheimer's disease hypertension. She has a history of chronic venous insufficiency with leg wounds in the past but no history of wounds in her feet. In terms of her diabetes at one point she was on insulin however she is no longer on any current treatment, her daughter who is providing most of the history is unaware of her hemoglobin A1c. She has stage IV chronic renal failure and follows with nephrology. The history provided by her daughter is that she has had a wound on the left heel perhaps dating back to last May/17. At that point she was in the hospital predominantly with psychiatric issues and was [facility]. This was treated with some form of foam dressing and may have actually healed however she was readmitted to hospital in January with Escherichia coli sepsis felt to be secondary to a UTI. Since then she is in [facility] skilled facility. Apparently this timeframe both the left heel and right heel reopened or at least the daughter became aware that they were both open. The exact timeframe isn't really certain Her ABIs in this clinic were 1.08 on the right 1.25 on the left. Looking through [facility] doesn't really provide any useful information. She did have venous ultrasounds in 2016 that did not show a DVT. I don't see a recent hemoglobin A1c 08/25/16; from last week we have been using  Santyl to both heels. Apparently the nursing home didn't get an x-ray of both heels [[facility]] and the daughter states that there was "no injury to bone" but for some reason we haven't been able to get the official report from them. 09/01/16; continued improvement in both wounds on her bilateral heels using Santyl. X-ray report was negative for osteomyelitis 09/08/16; patient from [facility]. Using Santyl on both her heel wounds which are fortunately not on the plantar surface. 09/15/16; patient is from [facility]. She has been using Santyl on both her pressure areas which were stage III. Fortunately these or not on the plantar surface. 09/22/16; patient arrives today with a much less viable looking surface on her heels. We had been using Santyl with good improvement however unchanged either fair last week. This may be unrelated however she required debridement today. 09/29/16; patient arrives with a better looking surface on the left heel unfortunately this is a more substantial wound. The area on the right lateral calcaneus again covered in a nonviable necrotic surface. We have been making good progress with Santyl. I think I'm going to need to go back to a debriding agent. 10/06/16; still nonviable surface material over the right greater than the left heel. Apparently the facility where this woman resides did not have Iodoflex so rather than calling the clinic they decided to make some cocktail of material and then settled on Hydrofera Blue. Patient is really generally made pretty good progress versus when she came in her first however still has too much nonviable surface especially on the right. 10/13/16; she arrives today  with Iodoflex on both heels. This was the dressing that we couldn't seem to get last week. Her daughter tells [place] that where they were traveling to [place] last weekend she actually use Santyl. Both wounds are in better condition  today and didn't seem to require debridement however I'm not exactly sure what is been most helpful 10/20/16; using Santyl to both heels better condition. Nursing reports green drainage 11/03/16 patient wounds appeared to be doing fairly well on evaluation today she seen along with her daughter in the office today. She does have slough covering the wound unfortunately no evidence of infection. 11/10/16; both wounds appear to be making nice progress. This is on the bilateral heels 11/24/16; he also continued to be making nice progress. Wounds are smaller. Using Hydrofera Blue. Orders are to change daily in the nursing home although according to her daughter they're actually being changed to her 3 times a week which is probably satisfactory 12/08/16 on evaluation today patient bilateral hills appear to be making improvement. Improvement is somewhat slow but nonetheless week by week we are noting some change. Overall I'm pleased with the progress that we have seen. Klooster, Lauraine ([id number]) Nonetheless patient states that she really would like for this to already be completely healed and closed which obviously it is not. She wonders how long it's gonna be before she can get back to normal walking and wearing shoes. No fevers, chills, nausea, or vomiting noted at this time. Secondary to mental status patient is unable to rate or describe her pain. 12/22/16; patient continues to make nice progress. The area on the right lateral calcaneus as improved and dimensions quite a bit since the last time I saw this, the area on the left is more substantial but also seems to be making improvement. No debridement was required in any area 01/05/17; the patient came in with the right lateral calcaneus covered an eschar. After removal there is still a small open area here. The area on the left still required debridement of circumferential senescent edges of tissue and nonviable surface material. We've been using Hydrofera  Blue and making progress albeit slowly 01/19/17; patient has skilled facility I've been following for substantial right and left calcaneus pressure ulcers. We've been using Hydrofera Blue. The one on the right appears to be closed today. Left down in dimensions 02/02/17; the area on the right lateral heel remains closed. Still be open wound on the left. Dimensions down however not as much as I would like. Using Hydrofera Blue 02/16/17; 2 week follow-up for wound remaining on the right lateral heel. We have been using Hydrofera Blue. The patient states she feels well and has no complaints 03/01/17; 2 week follow-up for the wound remaining on her right lateral heel which looks quite good today we've been using Hydrofera Blue as the primary dressing. Our intake nurse reports that the patient is somewhat listless and not nearly as steady on her feet as usual. This information is seconded by the patient's daughter who is present. The patient is at a nursing home. In discussing things with her daughter she apparently has a history of psychosis predating some dementia. She is on Risperdal 3 mg a day. Per the daughter she has not had any recent psychosis. The patient denies fever, chills, sore throat, shortness of breath, diarrhea or dysuria.. She is not had any headaches but states her balance is poor 03/15/17; 2 week follow-up of remaining wound on the right lateral heel. Unfortunately no major change today  we've been using Hydrofera Blue. There are thick surrounding edges to this which are going to require debridement. 03/30/17; apparently since the last visit the patient was hospitalized for psychosis she is now back on 3 mg twice a day of Risperdal per her daughter. Unfortunately no major change in the left heel wound. Still thick surrounding edges around this area. She also has a new anterior wound on the left tibial area which is superficial Electronic Signature(s) Signed: 03/30/2017 5:29:04 PM By:  Baltazar Najjar MD Entered By: Baltazar Najjar on 03/30/2017 16:41:55 Varelas, [name] -------------------------------------------------------------------------------- Physical Exam Details Patient Name: [name] Date of Service: 03/30/2017 1:30 PM Medical Record Number: [id number] Patient Account Number: [id number] Date of Birth/Sex: May 04, 1935 (80 y.o. Female) Treating RN: Primary Care Provider: Duncan Dull Other Clinician: Referring Provider: Duncan Dull Treating Provider/Extender: [name] [place] in Treatment: 32 Constitutional Sitting or standing Blood Pressure is within target range for patient.. Pulse regular and within target range for patient.Marland Kitchen Respirations regular, non-labored and within target range.. Temperature is normal and within the target range for the patient.Marland Kitchen appears in no distress. Eyes Conjunctivae clear. No discharge. Respiratory Respiratory effort is easy and symmetric bilaterally. Rate is normal at rest and on room air.. Cardiovascular Pedal pulses palpable and strong bilaterally.. Integumentary (Hair, Skin) no rashes same. Neurological parkinsonian appearance. Psychiatric no overt psychosis she is not quite as allert.. Notes wound exam; the area on the left lateral heel has not changed. The surface of the wound probably needs debridement although do this next time. She has thick surrounding skin and subcutaneous tissue [rolled edges] that we'll need to be removed oArrives today with a superficial wound on the left anterior leg this looks like trauma. We did not the fine this last week although her daughter states that it was present but not open Electronic Signature(s) Signed: 03/30/2017 5:29:04 PM By: Baltazar Najjar MD Entered By: Baltazar Najjar on 03/30/2017 16:46:36 [place]jon, [name] ([id number]) -------------------------------------------------------------------------------- Physician Orders Details Patient Name: [name] Date of Service: 03/30/2017 1:30 PM Medical Record Number: [id number] Patient Account Number: [id number] Date of Birth/Sex: 1935-12-11 (80 y.o. Female) Treating RN: Huel Coventry Primary Care Provider: Duncan Dull Other Clinician: Referring Provider: Duncan Dull Treating Provider/Extender: [name] [place] in Treatment: 2 Verbal / Phone Orders: No Diagnosis Coding Wound Cleansing Wound #2 Left Calcaneus o Clean wound with Normal Saline. o May Shower, gently pat wound dry prior to applying new dressing. Wound #3 Left,Medial Lower Leg o Clean wound with Normal Saline. o May Shower, gently pat wound dry prior to applying new dressing. Anesthetic Wound #2 Left Calcaneus o Topical Lidocaine 4% cream applied to wound bed prior to debridement Wound #3 Left,Medial Lower Leg o Topical Lidocaine 4% cream applied to wound bed prior to debridement Primary Wound Dressing Wound #2 Left Calcaneus o Hydrafera Blue Wound #3 Left,Medial Lower Leg o Hydrafera Blue Secondary Dressing Wound #2 Left Calcaneus o Conform/Kerlix o Foam - heel cups o Other - stretch netting #4 (please order for pt...daughter is aware) Wound #3 Left,Medial Lower Leg o Other - Coverlet Dressing Change Frequency Wound #2 Left Calcaneus o Change dressing every other day. Wound #3 Left,Medial Lower Leg o Change dressing every other day. Follow-up Appointments Wound #2 Left Calcaneus o Return Appointment in 2 [name]. Salvaggio, [name] ([id number]) Wound #3 Left,Medial Lower Leg o Return Appointment in 2 [name]. Edema Control Wound #2 Left Calcaneus o Elevate legs to the level of the heart and pump ankles as often as possible  Wound #3 Left,Medial Lower Leg o Elevate legs to the level of the heart and pump ankles as often as possible Off-Loading Wound #2 Left Calcaneus o Turn and reposition every 2 hours o Other: - wear heel protector boots at al times in bed, float  heels while in bed, Wound #3 Left,Medial Lower Leg o Turn and reposition every 2 hours o Other: - wear heel protector boots at al times in bed, float heels while in bed, Additional Orders / Instructions Wound #2 Left Calcaneus o Increase protein intake. o Other: - please add vitamin A, vitamin C and zinc supplements to your diet Wound #3 Left,Medial Lower Leg o Increase protein intake. o Other: - please add vitamin A, vitamin C and zinc supplements to your diet Electronic Signature(s) Signed: 03/30/2017 5:29:04 PM By: Baltazar Najjar MD Signed: 03/30/2017 5:56:57 PM By: Elliot Gurney, BSN, RN, CWS, Kim RN, BSN Entered By: Elliot Gurney, BSN, RN, CWS, Kim on 03/30/2017 13:57:38 Riesel, [name] ([id number]) -------------------------------------------------------------------------------- Problem List Details Patient Name: [name] Date of Service: 03/30/2017 1:30 PM Medical Record Number: [id number] Patient Account Number: [id number] Date of Birth/Sex: 1935-12-02 (80 y.o. 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 Female) Treating RN: Primary Care Provider: Duncan Dull Other Clinician: Referring Provider: Duncan Dull Treating Provider/Extender: [name] [place] in Treatment: 32 Subjective History of Present Illness (HPI) 08/18/16; this is a 81 year old woman who is a type II diabetic not currently on any treatment. She is also listed as having Alzheimer's disease hypertension. She has a history of chronic venous insufficiency with leg wounds in the past but no history of wounds in her feet. In terms of her diabetes at one point she was on insulin however she is no longer on any current treatment, her daughter who is providing most of the history is unaware of her hemoglobin A1c. She has stage IV chronic renal failure and follows with nephrology. The history provided by her daughter is that she has had a wound on the left heel perhaps dating back to last May/17. At that point she was in the hospital predominantly with psychiatric issues and was discharged to [facility]. This was treated with some form of foam dressing and may have actually healed however she was readmitted to hospital in January with Escherichia coli sepsis felt to be secondary to a UTI. Since then she is in [facility] skilled facility. Apparently this timeframe both the left heel and right heel reopened or at least the daughter became aware that they were both open. The exact timeframe isn't really certain Her ABIs in this clinic were 1.08 on the right 1.25 on the left. Looking through [facility] doesn't really provide any useful information. She did have venous ultrasounds in 2016 that did not show a DVT. I don't see a recent hemoglobin A1c 08/25/16; from last week we  have been using Santyl to both heels. Apparently the nursing home didn't get an x-ray of both heels [[facility]] and the daughter states that there was "no injury to bone" but for some reason we haven't been able to get  the official report from them. 09/01/16; continued improvement in both wounds on her bilateral heels using Santyl. X-ray report was negative for osteomyelitis 09/08/16; patient from [facility] skilled facility. Using Santyl on both her heel wounds which are fortunately not on the plantar surface. 09/15/16; patient is from [facility] skilled facility. She has been using Santyl on both her pressure areas which were stage III. Fortunately these or not on the plantar surface. 09/22/16; patient arrives today with a much less viable looking surface on her heels. We had been using Santyl with good improvement however unchanged either fair last week. This may be unrelated however she required debridement today. 09/29/16; patient arrives with a better looking surface on the left heel unfortunately this is a more substantial wound. The area on the right lateral calcaneus again covered in a nonviable necrotic surface. We have been making good progress with Santyl. I think I'm going to need to go back to a debriding agent. 10/06/16; still nonviable surface material over the right greater than the left heel. Apparently the facility where this woman resides did not have Iodoflex so rather than calling the clinic they decided to make some cocktail of material and then settled on Hydrofera Blue. Patient is really generally made pretty good progress versus when she came in her first however still has too much nonviable surface especially on the right. 10/13/16; she arrives today with Iodoflex on both heels. This was the dressing that we couldn't seem to get last week. Her daughter tells [place] that where they were traveling to [place] last weekend she actually use Santyl. Both wounds are in better condition today and didn't seem to require debridement however I'm not exactly sure what is been most helpful 10/20/16; using Santyl to both heels better condition. Nursing reports green drainage 11/03/16 patient wounds  appeared to be doing fairly well on evaluation today she seen along with her daughter in the office today. She does have slough covering the wound unfortunately no evidence of infection. 11/10/16; both wounds appear to be making nice progress. This is on the bilateral heels 11/24/16; he also continued to be making nice progress. Wounds are smaller. Using Hydrofera Blue. Orders are to change daily in the nursing home although according to her daughter they're actually being changed to her 3 times a week which is probably satisfactory 12/08/16 on evaluation today patient bilateral hills appear to be making improvement. Improvement is somewhat slow but nonetheless week by week we are noting some change. Overall I'm pleased with the progress that we have seen. Nonetheless patient states that she really would like for this to already be completely healed and closed which obviously it is not. She wonders how long it's gonna be before she can get back to normal walking and wearing shoes. No fevers, chills, Glauber, Etrulia ([id number]) nausea, or vomiting noted at this time. Secondary to mental status patient is unable to rate or describe her pain. 12/22/16; patient continues to make nice progress. The area on the right lateral calcaneus as improved and dimensions quite a bit since the last time I saw this, the area on the left is more substantial but also seems to be making improvement. No debridement was required in any area 01/05/17; the patient came  in with the right lateral calcaneus covered an eschar. After removal there is still a small open area here. The area on the left still required debridement of circumferential senescent edges of tissue and nonviable surface material. We've been using Hydrofera Blue and making progress albeit slowly 01/19/17; patient has skilled facility I've been following for substantial right and left calcaneus pressure ulcers. We've been using Hydrofera Blue. The one on the right  appears to be closed today. Left down in dimensions 02/02/17; the area on the right lateral heel remains closed. Still be open wound on the left. Dimensions down however not as much as I would like. Using Hydrofera Blue 02/16/17; 2 week follow-up for wound remaining on the right lateral heel. We have been using Hydrofera Blue. The patient states she feels well and has no complaints 03/01/17; 2 week follow-up for the wound remaining on her right lateral heel which looks quite good today we've been using Hydrofera Blue as the primary dressing. Our intake nurse reports that the patient is somewhat listless and not nearly as steady on her feet as usual. This information is seconded by the patient's daughter who is present. The patient is at a nursing home. In discussing things with her daughter she apparently has a history of psychosis predating some dementia. She is on Risperdal 3 mg a day. Per the daughter she has not had any recent psychosis. The patient denies fever, chills, sore throat, shortness of breath, diarrhea or dysuria.. She is not had any headaches but states her balance is poor 03/15/17; 2 week follow-up of remaining wound on the right lateral heel. Unfortunately no major change today we've been using Hydrofera Blue. There are thick surrounding edges to this which are going to require debridement. 03/30/17; apparently since the last visit the patient was hospitalized for psychosis she is now back on 3 mg twice a day of Risperdal per her daughter. Unfortunately no major change in the left heel wound. Still thick surrounding edges around this area. She also has a new anterior wound on the left tibial area which is superficial Objective Constitutional Sitting or standing Blood Pressure is within target range for patient.. Pulse regular and within target range for patient.Marland Kitchen. Respirations regular, non-labored and within target range.. Temperature is normal and within the target range for the  patient.Marland Kitchen. appears in no distress. Vitals Time Taken: 1:35 PM, Height: 66 in, Weight: 140.8 lbs, BMI: 22.7, Pulse: 72 bpm, Respiratory Rate: 16 breaths/min, Blood Pressure: 125/40 mmHg. Eyes Conjunctivae clear. No discharge. Respiratory Respiratory effort is easy and symmetric bilaterally. Rate is normal at rest and on room air.. Cardiovascular Pedal pulses palpable and strong bilaterally.. Neurological parkinsonian appearance. Psychiatric [name], [name] ([id number]) no overt psychosis she is not quite as allert.. General Notes: wound exam; the area on the left lateral heel has not changed. The surface of the wound probably needs debridement although do this next time. She has thick surrounding skin and subcutaneous tissue [rolled edges] that we'll need to be removed Arrives today with a superficial wound on the left anterior leg this looks like trauma. We did not the fine this last week although her daughter states that it was present but not open Integumentary (Hair, Skin) no rashes same. Wound #2 status is Open. Original cause of wound was Pressure Injury. The wound is located on the Left Calcaneus. The wound measures 0.7cm length x 0.9cm width x 0.3cm depth; 0.495cm^2 area and 0.148cm^3 volume. There is Fat Layer (Subcutaneous Tissue) Exposed exposed. There is  no tunneling or undermining noted. There is a large amount of serous drainage noted. Foul odor after cleansing was noted. The wound margin is flat and intact. There is large (67-100%) pink granulation within the wound bed. There is a small (1-33%) amount of necrotic tissue within the wound bed including Eschar and Adherent Slough. The periwound skin appearance did not exhibit: Callus, Crepitus, Excoriation, Induration, Rash, Scarring, Dry/Scaly, Maceration, Atrophie Blanche, Cyanosis, Ecchymosis, Hemosiderin Staining, Mottled, Pallor, Rubor, Erythema. Periwound temperature was noted as No Abnormality. The periwound has tenderness  on palpation. Wound #3 status is Open. Original cause of wound was Trauma. The wound is located on the Left,Medial Lower Leg. The wound measures 1.5cm length x 1.4cm width x 0.1cm depth; 1.649cm^2 area and 0.165cm^3 volume. There is no tunneling or undermining noted. There is a small amount of sanguinous drainage noted. The wound margin is flat and intact. There is large (67-100%) red granulation within the wound bed. There is no necrotic tissue within the wound bed. The periwound skin appearance exhibited: Excoriation. 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 May Shower, gently pat wound dry prior to applying new dressing. Wound #3 Left,Medial Lower Leg: Clean wound with Normal Saline. May Shower, gently pat wound dry prior to applying new dressing. Anesthetic: Wound #2 Left Calcaneus: Topical Lidocaine 4% cream applied to wound bed prior to debridement Wound #3 Left,Medial Lower Leg: Topical Lidocaine 4% cream applied to wound bed prior to debridement [place], [name] Primary Wound Dressing: Wound #2 Left Calcaneus: Hydrafera Blue Wound #3 Left,Medial Lower Leg: Hydrafera Blue Secondary Dressing: Wound #2 Left Calcaneus: Conform/Kerlix Foam - heel cups Other - stretch netting #4 (please order for pt...daughter is aware) Wound #3 Left,Medial Lower Leg: Other - Coverlet Dressing Change Frequency: Wound #2 Left Calcaneus: Change dressing every other day. Wound #3 Left,Medial Lower Leg: Change dressing every other day. Follow-up Appointments: Wound #2 Left Calcaneus: Return Appointment in 2 [name]. Wound #3 Left,Medial Lower Leg: Return Appointment in 2 [name]. Edema Control: Wound #2 Left  Calcaneus: Elevate legs to the level of the heart and pump ankles as often as possible Wound #3 Left,Medial Lower Leg: Elevate legs to the level of the heart and pump ankles as often as possible Off-Loading: Wound #2 Left Calcaneus: Turn and reposition every 2 hours Other: - wear heel protector boots at al times in bed, float heels while in bed, Wound #3 Left,Medial Lower Leg: Turn and reposition every 2 hours Other: - wear heel protector boots at al times in bed, float heels while in bed, Additional Orders / Instructions: Wound #2 Left Calcaneus: Increase protein intake. Other: - please add vitamin A, vitamin C and zinc supplements to your diet Wound #3 Left,Medial Lower Leg: Increase protein intake. Other: - please add vitamin A, vitamin C and zinc supplements to your diet #1I'm going to use Hydrofera Blue to both wound areas #2 likely will need an aggressive debridement of the heel next visit if there is not a major change in dimensions. Electronic Signature(s) Signed: 03/30/2017 5:29:04 PM By: Baltazar Najjar MD Entered By: Baltazar Najjar on 03/30/2017 16:47:17 [place], [name] ([id number]) -------------------------------------------------------------------------------- SuperBill Details Patient Name: [name] Date of Service: 03/30/2017 Medical Record Number: [id number] Patient Account Number: [id number] Date of Birth/Sex: 1935-09-21 (80  y.o. 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

## 2017-04-08 NOTE — Progress Notes (Addendum)
[name], [name] Visit Report for 03/30/2017 Arrival Information Details Patient Name: [name], [name] Date of Service: 03/30/2017 1:30 PM Medical Record Number: [id number] Patient Account Number: [id number] Date of Birth/Sex: [birth year] (81 y.o. Female) Treating RN: Huel Coventry Primary Care Georgine Wiltse: Duncan Dull Other Clinician: Referring [name] Schutter: Duncan Dull Treating Marquavion Venhuizen/Extender: Altamese [place] in Treatment: 32 Visit Information History Since Last Visit Any new allergies or adverse reactions: No Patient Arrived: Wheel Chair Had a fall or experienced change in No Arrival Time: 13:34 activities of daily living that may affect Accompanied By: transportation risk of falls: Transfer Assistance: Manual Signs or symptoms of abuse/neglect since last visito No Patient Identification Verified: Yes Hospitalized since last visit: No Secondary Verification Process Completed: Yes Has Dressing in Place as Prescribed: Yes Patient Requires Transmission-Based No Pain Present Now: No Precautions: Patient Has Alerts: Yes Patient Alerts: DM II Electronic Signature(s) Signed: 03/30/2017 5:56:57 PM By: Elliot Gurney, BSN, RN, CWS, Kim RN, BSN Entered By: Elliot Gurney, BSN, RN, CWS, [name] on 03/30/2017 13:35:20 [place], [name] ([id number]) -------------------------------------------------------------------------------- Clinic Level of Care Assessment Details Patient Name: [name] Date of Service: 03/30/2017 1:30 PM Medical Record Number: [id number] Patient Account Number: [id number] Date of Birth/Sex: 06-30-1935 (81 y.o. Female) Treating RN: Huel Coventry Primary Care Bahar Shelden: Duncan Dull Other Clinician: Referring Pami Wool: Duncan Dull Treating Kloee Ballew/Extender: Altamese [place] in Treatment: 32 Clinic Level of Care Assessment Items TOOL 4 Quantity Score []  - Use when only an EandM is performed on FOLLOW-UP visit 0 ASSESSMENTS - Nursing Assessment / Reassessment []  -  Reassessment of Co-morbidities (includes updates in patient status) 0 X- 1 5 Reassessment of Adherence to Treatment Plan ASSESSMENTS - Wound and Skin Assessment / Reassessment []  - Simple Wound Assessment / Reassessment - one wound 0 X- 2 5 Complex Wound Assessment / Reassessment - multiple wounds []  - 0 Dermatologic / Skin Assessment (not related to wound area) ASSESSMENTS - Focused Assessment []  - Circumferential Edema Measurements - multi extremities 0 []  - 0 Nutritional Assessment / Counseling / Intervention []  - 0 Lower Extremity Assessment (monofilament, tuning fork, pulses) []  - 0 Peripheral Arterial Disease Assessment (using hand held doppler) ASSESSMENTS - Ostomy and/or Continence Assessment and Care []  - Incontinence Assessment and Management 0 []  - 0 Ostomy Care Assessment and Management (repouching, etc.) PROCESS - Coordination of Care X - Simple Patient / Family Education for ongoing care 1 15 []  - 0 Complex (extensive) Patient / Family Education for ongoing care X- 1 10 Staff obtains Chiropractor, Records, Test Results / Process Orders []  - 0 Staff telephones HHA, Nursing Homes / Clarify orders / etc []  - 0 Routine Transfer to another Facility (non-emergent condition) []  - 0 Routine Hospital Admission (non-emergent condition) []  - 0 New Admissions / Manufacturing engineer / Ordering NPWT, Apligraf, etc. []  - 0 Emergency Hospital Admission (emergent condition) X- 1 10 Simple Discharge Coordination [place], Cassady ([id number]) []  - 0 Complex (extensive) Discharge Coordination PROCESS - Special Needs []  - Pediatric / Minor Patient Management 0 []  - 0 Isolation Patient Management []  - 0 Hearing / Language / Visual special needs []  - 0 Assessment of Community assistance (transportation, D/C planning, etc.) []  - 0 Additional assistance / Altered mentation []  - 0 Support Surface(s) Assessment (bed, cushion, seat, etc.) INTERVENTIONS - Wound Cleansing /  Measurement X - Simple Wound Cleansing - one wound 1 5 []  - 0 Complex Wound Cleansing - multiple wounds X- 1 5 Wound Imaging (photographs - any number of wounds) []  - 0 Wound  Tracing (instead of photographs) X- 1 5 Simple Wound Measurement - one wound []  - 0 Complex Wound Measurement - multiple wounds INTERVENTIONS - Wound Dressings []  - Small Wound Dressing one or multiple wounds 0 X- 1 15 Medium Wound Dressing one or multiple wounds []  - 0 Large Wound Dressing one or multiple wounds []  - 0 Application of Medications - topical []  - 0 Application of Medications - injection INTERVENTIONS - Miscellaneous []  - External ear exam 0 []  - 0 Specimen Collection (cultures, biopsies, blood, body fluids, etc.) []  - 0 Specimen(s) / Culture(s) sent or taken to Lab for analysis []  - 0 Patient Transfer (multiple staff / Nurse, adult / Similar devices) []  - 0 Simple Staple / Suture removal (25 or less) []  - 0 Complex Staple / Suture removal (26 or more) []  - 0 Hypo / Hyperglycemic Management (close monitor of Blood Glucose) []  - 0 Ankle / Brachial Index (ABI) - do not check if billed separately X- 1 5 Vital Signs Ng, [name] Has the patient been seen at the hospital within the last three years: Yes Total Score: 85 Level Of Care: New/Established - Level 3 Electronic Signature(s) Signed: 03/30/2017 5:56:57 PM By: Elliot Gurney, BSN, RN, CWS, Kim RN, BSN Entered By: Elliot Gurney, BSN, RN, CWS, [name] on 03/30/2017 13:58:11 [place], [name] ([id number]) -------------------------------------------------------------------------------- Encounter Discharge Information Details Patient Name: [name] Date of Service: 03/30/2017 1:30 PM Medical Record Number: [id number] Patient Account Number: [id number] Date of Birth/Sex: October 09, 1935 (81 y.o. Female) Treating RN: Huel Coventry Primary Care Yolette Hastings: Duncan Dull Other Clinician: Referring Gehrig Patras: Duncan Dull Treating Courtland Reas/Extender:  Altamese [place] in Treatment: 17 Encounter Discharge Information Items Facility Notification Discharge Pain Level: 0 Facility Type: Skilled Nursing Facility Discharge Condition: Stable Telephoned: No Ambulatory Status: Wheelchair Orders Sent: Yes Discharge Destination: Home Transportation: Private Auto daughter and Accompanied By: transportation Schedule Follow-up Appointment: Yes Medication Reconciliation completed and Yes provided to Patient/Care Jamisyn Langer: Provided on Clinical Summary of Care: 03/30/2017 Form Type Recipient Paper Patient [facility] Electronic Signature(s) Signed: 04/06/2017 9:14:59 AM By: Gwenlyn Perking Entered By: Gwenlyn Perking on 03/30/2017 14:08:14 Gonnella, [name] ([id number]) -------------------------------------------------------------------------------- Lower Extremity Assessment Details Patient Name: [name] Date of Service: 03/30/2017 1:30 PM Medical Record Number: [id number] Patient Account Number: [id number] Date of Birth/Sex: 1935-09-21 (81 y.o. Female) Treating RN: Huel Coventry Primary Care Emanuelle Bastos: Duncan Dull Other Clinician: Referring Arlyss Weathersby: Duncan Dull Treating Darby Fleeman/Extender: Altamese [place] in Treatment: 80 Vascular Assessment Pulses: Dorsalis Pedis Palpable: [Left:Yes] Posterior Tibial Extremity colors, hair growth, and conditions: Extremity Color: [Left:Hyperpigmented] Hair Growth on Extremity: [Left:No] Temperature of Extremity: [Left:Warm] Capillary Refill: [Left:> 3 seconds] Toe Nail Assessment Left: Right: Thick: Yes Discolored: Yes Deformed: No Improper Length and Hygiene: No Electronic Signature(s) Signed: 03/30/2017 5:56:57 PM By: Elliot Gurney, BSN, RN, CWS, Kim RN, BSN Entered By: Elliot Gurney, BSN, RN, CWS, [name] on 03/30/2017 13:44:02 [place], [name] ([id number]) -------------------------------------------------------------------------------- Multi Wound Chart Details Patient Name: [name] Date of  Service: 03/30/2017 1:30 PM Medical Record Number: [id number] Patient Account Number: [id number] Date of Birth/Sex: 04-11-1936 (81 y.o. Female) Treating RN: Primary Care Omega Durante: Duncan Dull Other Clinician: Referring Halton Neas: Duncan Dull Treating Raizel Wesolowski/Extender: Altamese [place] in Treatment: 32 Vital Signs Height(in): 66 Pulse(bpm): 72 Weight(lbs): 140.8 Blood Pressure(mmHg): 125/40 Body Mass Index(BMI): 23 Temperature(F): Respiratory Rate 16 (breaths/min): Photos: [N/A:N/A] Wound Location: Left Calcaneus Left Lower Leg - Medial N/A Wounding Event: Pressure Injury Trauma N/A Primary Etiology: Pressure Ulcer Trauma, Other N/A Secondary Etiology: N/A Diabetic Wound/Ulcer of the N/A Lower Extremity  Comorbid History: Cataracts, Anemia, Cataracts, Anemia, N/A Congestive Heart Failure, Congestive Heart Failure, Hypertension, Type II Hypertension, Type II Diabetes, Gout, Dementia, Diabetes, Gout, Dementia, Neuropathy Neuropathy Date Acquired: 07/05/2016 03/16/2017 N/A Weeks of Treatment: 32 0 N/A Wound Status: Open Open N/A Measurements L x W x D 0.7x0.9x0.3 1.5x1.4x0.1 N/A (cm) Area (cm) : 0.495 1.649 N/A Volume (cm) : 0.148 0.165 N/A % Reduction in Area: 95.50% N/A N/A % Reduction in Volume: 93.30% N/A N/A Classification: Category/Stage III Full Thickness Without N/A Exposed Support Structures Exudate Amount: Large Small N/A Exudate Type: Serous Sanguinous N/A Exudate Color: amber red N/A Foul Odor After Cleansing: Yes No N/A Odor Anticipated Due to No N/A N/A Product Use: Wound Margin: Flat and Intact Flat and Intact N/A Granulation Amount: Large (67-100%) Large (67-100%) N/A Granulation Quality: Pink Red N/A Necrotic Amount: Small (1-33%) None Present (0%) N/A Schaad, [name] ([id number]) Necrotic Tissue: Eschar, Adherent Slough N/A N/A Exposed Structures: Fat Layer (Subcutaneous Fascia: No N/A Tissue) Exposed: Yes Fat Layer (Subcutaneous Fascia:  No Tissue) Exposed: No Tendon: No Tendon: No Muscle: No Muscle: No Joint: No Joint: No Bone: No Bone: No Epithelialization: None None N/A Periwound Skin Texture: Excoriation: No Excoriation: Yes N/A Induration: No Callus: No Crepitus: No Rash: No Scarring: No Periwound Skin Moisture: Maceration: No No Abnormalities Noted N/A Dry/Scaly: No Periwound Skin Color: Atrophie Blanche: No No Abnormalities Noted N/A Cyanosis: No Ecchymosis: No Erythema: No Hemosiderin Staining: No Mottled: No Pallor: No Rubor: No Temperature: No Abnormality N/A N/A Tenderness on Palpation: Yes No N/A Wound Preparation: Ulcer Cleansing: Ulcer Cleansing: N/A Rinsed/Irrigated with Saline Rinsed/Irrigated with Saline Topical Anesthetic Applied: Topical Anesthetic Applied: Other: lidocaine 4% Other: lidocaine 4% Treatment Notes Wound #2 (Left Calcaneus) 1. Cleansed with: Clean wound with Normal Saline 2. Anesthetic Topical Lidocaine 4% cream to wound bed prior to debridement 4. Dressing Applied: Hydrafera Blue Notes Coverlet; Heel cup and kerlix Wound #3 (Left, Medial Lower Leg) 1. Cleansed with: Clean wound with Normal Saline 2. Anesthetic Topical Lidocaine 4% cream to wound bed prior to debridement 4. Dressing Applied: Hydrafera Blue Notes Coverlet; Heel cup and kerlix [place], [name] ([id number]) Electronic Signature(s) Signed: 03/30/2017 5:29:04 PM By: Baltazar Najjarobson, [name] MD Entered By: Baltazar Najjarobson, [name] on 03/30/2017 16:39:52 Grieder, [name] ([id number]) -------------------------------------------------------------------------------- Multi-Disciplinary Care Plan Details Patient Name: [name] LoboHEELY, [name] Date of Service: 03/30/2017 1:30 PM Medical Record Number: [id number] Patient Account Number: [id number] Date of Birth/Sex: [birth year] (81 y.o. Female) Treating RN: Huel CoventryWoody, [name] Primary Care Tela Kotecki: Duncan Dullullo, Teresa Other Clinician: Referring Thoma Paulsen: Duncan Dullullo, Teresa Treating  Analissa Bayless/Extender: Altamese CarolinaOBSON, [name] G Weeks in Treatment: 6032 Active Inactive Electronic Signature(s) Signed: 04/14/2017 4:27:59 PM By: Elliot GurneyWoody, BSN, RN, CWS, Kim RN, BSN Previous Signature: 03/30/2017 5:56:57 PM Version By: Elliot GurneyWoody, BSN, RN, CWS, Kim RN, BSN Entered By: Elliot GurneyWoody, BSN, RN, CWS, [name] on 04/14/2017 16:27:58 VictoriaHEELY, [name] ([id number]) -------------------------------------------------------------------------------- Pain Assessment Details Patient Name: [name] LoboHEELY, Acelyn Date of Service: 03/30/2017 1:30 PM Medical Record Number: [id number] Patient Account Number: [id number] Date of Birth/Sex: [birth year] (81 y.o. Female) Treating RN: Huel CoventryWoody, [name] Primary Care Jayshawn Colston: Duncan Dullullo, Teresa Other Clinician: Referring Arvon Schreiner: Duncan Dullullo, Teresa Treating Korry Dalgleish/Extender: Altamese CarolinaOBSON, [name] G Weeks in Treatment: 832 Active Problems Location of Pain Severity and Description of Pain Patient Has Paino No Site Locations With Dressing Change: No Pain Management and Medication Current Pain Management: Goals for Pain Management Topical or injectable lidocaine is offered to patient for acute pain when surgical debridement is performed. If needed, Patient is instructed to use over the counter pain medication for the following 24-48  hours after debridement. Wound care MDs do not prescribed pain medications. Patient has chronic pain or uncontrolled pain. Patient has been instructed to make an appointment with their Primary Care Physician for pain management. Electronic Signature(s) Signed: 03/30/2017 5:56:57 PM By: Elliot Gurney, BSN, RN, CWS, Kim RN, BSN Entered By: Elliot Gurney, BSN, RN, CWS, [name] on 03/30/2017 13:35:29 [place], [name] ([id number]) -------------------------------------------------------------------------------- Patient/Caregiver Education Details Patient Name: [name] Date of Service: 03/30/2017 1:30 PM Medical Record Number: [id number] Patient Account Number: [id number] Date of Birth/Gender: 09-24-1935  (81 y.o. Female) Treating RN: Huel Coventry Primary Care Physician: Duncan Dull Other Clinician: Referring Physician: Duncan Dull Treating Physician/Extender: Altamese [place] in Treatment: 75 Education Assessment Education Provided To: Patient Education Topics Provided Wound/Skin Impairment: Handouts: Caring for Your Ulcer Methods: Demonstration Responses: State content correctly [employer]) Signed: 03/30/2017 5:56:57 PM By: Elliot Gurney, BSN, RN, CWS, Kim RN, BSN Entered By: Elliot Gurney, BSN, RN, CWS, [name] on 03/30/2017 14:00:18 [place], [name] ([id number]) -------------------------------------------------------------------------------- Wound Assessment Details Patient Name: [name] Date of Service: 03/30/2017 1:30 PM Medical Record Number: [id number] Patient Account Number: [id number] Date of Birth/Sex: 19-Oct-1935 (81 y.o. Female) Treating RN: Huel Coventry Primary Care Rj Pedrosa: Duncan Dull Other Clinician: Referring Rolla Servidio: Duncan Dull Treating Aaniya Sterba/Extender: Altamese [place] in Treatment: 32 Wound Status Wound Number: 2 Primary Pressure Ulcer Etiology: Wound Location: Left Calcaneus Wound Open Wounding Event: Pressure Injury Status: Date Acquired: 07/05/2016 Comorbid Cataracts, Anemia, Congestive Heart Failure, Weeks Of Treatment: 32 History: Hypertension, Type II Diabetes, Gout, Clustered Wound: No Dementia, Neuropathy Photos Photo Uploaded By: Elliot Gurney, BSN, RN, CWS, [name] on 03/30/2017 14:05:14 Wound Measurements Length: (cm) 0.7 Width: (cm) 0.9 Depth: (cm) 0.3 Area: (cm) 0.495 Volume: (cm) 0.148 % Reduction in Area: 95.5% % Reduction in Volume: 93.3% Epithelialization: None Tunneling: No Undermining: No Wound Description Classification: Category/Stage III Wound Margin: Flat and Intact Exudate Amount: Large Exudate Type: Serous Exudate Color: amber Foul Odor After Cleansing: Yes Due to Product Use: No Slough/Fibrino No Wound  Bed Granulation Amount: Large (67-100%) Exposed Structure Granulation Quality: Pink Fascia Exposed: No Necrotic Amount: Small (1-33%) Fat Layer (Subcutaneous Tissue) Exposed: Yes Necrotic Quality: Eschar, Adherent Slough Tendon Exposed: No Muscle Exposed: No Joint Exposed: No Bone Exposed: No Periwound Skin Texture Texture Color No Abnormalities Noted: No No Abnormalities Noted: No Demont, [name] ([id number]) Callus: No Atrophie Blanche: No Crepitus: No Cyanosis: No Excoriation: No Ecchymosis: No Induration: No Erythema: No Rash: No Hemosiderin Staining: No Scarring: No Mottled: No Pallor: No Moisture Rubor: No No Abnormalities Noted: No Dry / Scaly: No Temperature / Pain Maceration: No Temperature: No Abnormality Tenderness on Palpation: Yes Wound Preparation Ulcer Cleansing: Rinsed/Irrigated with Saline Topical Anesthetic Applied: Other: lidocaine 4%, Electronic Signature(s) Signed: 03/30/2017 5:56:57 PM By: Elliot Gurney, BSN, RN, CWS, Kim RN, BSN Entered By: Elliot Gurney, BSN, RN, CWS, [name] on 03/30/2017 13:41:04 [place], [name] ([id number]) -------------------------------------------------------------------------------- Wound Assessment Details Patient Name: [name] Date of Service: 03/30/2017 1:30 PM Medical Record Number: [id number] Patient Account Number: [id number] Date of Birth/Sex: May 02, 1935 (81 y.o. Female) Treating RN: Huel Coventry Primary Care Militza Devery: Duncan Dull Other Clinician: Referring Shylo Dillenbeck: Duncan Dull Treating Tytan Sandate/Extender: Altamese [place] in Treatment: 32 Wound Status Wound Number: 3 Primary Trauma, Other Etiology: Wound Location: Left Lower Leg - Medial Secondary Diabetic Wound/Ulcer of the Lower Extremity Wounding Event: Trauma Etiology: Date Acquired: 03/16/2017 Wound Open Weeks Of Treatment: 0 Status: Clustered Wound: No Comorbid Cataracts, Anemia, Congestive Heart Failure, History: Hypertension, Type II Diabetes,  Gout, Dementia, Neuropathy Photos Photo Uploaded By: Elliot Gurney, BSN,  RN, CWS, [name] on 03/30/2017 14:05:15 Wound Measurements Length: (cm) 1.5 Width: (cm) 1.4 Depth: (cm) 0.1 Area: (cm) 1.649 Volume: (cm) 0.165 % Reduction in Area: % Reduction in Volume: Epithelialization: None Tunneling: No Undermining: No Wound Description Full Thickness Without Exposed Support Foul Odo Classification: Structures Slough/F Wound Margin: Flat and Intact Exudate Small Amount: Exudate Type: Sanguinous Exudate Color: red [name] After Cleansing: No ibrino No Wound Bed Granulation Amount: Large (67-100%) Exposed Structure Granulation Quality: Red Fascia Exposed: No Necrotic Amount: None Present (0%) Fat Layer (Subcutaneous Tissue) Exposed: No Tendon Exposed: No Muscle Exposed: No Joint Exposed: No Bone Exposed: No [place], [name] ([id number]) Periwound Skin Texture Texture Color No Abnormalities Noted: No No Abnormalities Noted: No Excoriation: Yes Moisture No Abnormalities Noted: No Wound Preparation Ulcer Cleansing: Rinsed/Irrigated with Saline Topical Anesthetic Applied: Other: lidocaine 4%, Electronic Signature(s) Signed: 03/30/2017 5:56:57 PM By: Elliot GurneyWoody, BSN, RN, CWS, Kim RN, BSN Entered By: Elliot GurneyWoody, BSN, RN, CWS, [name] on 03/30/2017 13:42:59 [place]ELY, [name] ([id number]) -------------------------------------------------------------------------------- Vitals Details Patient Name: [name] LoboHEELY, Adreana Date of Service: 03/30/2017 1:30 PM Medical Record Number: [id number] Patient Account Number: [id number] Date of Birth/Sex: [birth year] (81 y.o. Female) Treating RN: Huel CoventryWoody, [name] Primary Care Taresa [place]: Duncan Dullullo, Teresa Other Clinician: Referring Nechama Escutia: Duncan Dullullo, Teresa Treating Harun Brumley/Extender: Altamese CarolinaOBSON, [name] G Weeks in Treatment: 32 Vital Signs Time Taken: 13:35 Pulse (bpm): 72 Height (in): 66 Respiratory Rate (breaths/min): 16 Weight (lbs): 140.8 Blood Pressure (mmHg): 125/40 Body Mass Index  (BMI): 22.7 Reference Range: 80 - 120 mg / dl Electronic Signature(s) Signed: 03/30/2017 5:56:57 PM By: Elliot GurneyWoody, BSN, RN, CWS, Kim RN, BSN Entered By: Elliot GurneyWoody, BSN, RN, CWS, [name] on 03/30/2017 13:35:49

## 2017-04-13 ENCOUNTER — Ambulatory Visit: Payer: Self-pay | Admitting: Internal Medicine

## 2017-04-26 DEATH — deceased
